# Patient Record
Sex: Female | Born: 1935 | Race: White | Hispanic: No | Marital: Married | Smoking: Former smoker
Health system: Southern US, Community
[De-identification: ages and names within clinical notes are randomized; demographics above are authoritative.]

## PROBLEM LIST (undated history)

## (undated) DIAGNOSIS — G2581 Restless legs syndrome: Secondary | ICD-10-CM

## (undated) DIAGNOSIS — E042 Nontoxic multinodular goiter: Secondary | ICD-10-CM

## (undated) DIAGNOSIS — M810 Age-related osteoporosis without current pathological fracture: Secondary | ICD-10-CM

## (undated) DIAGNOSIS — I2510 Atherosclerotic heart disease of native coronary artery without angina pectoris: Secondary | ICD-10-CM

## (undated) DIAGNOSIS — E88819 Insulin resistance, unspecified: Secondary | ICD-10-CM

## (undated) DIAGNOSIS — E119 Type 2 diabetes mellitus without complications: Secondary | ICD-10-CM

## (undated) DIAGNOSIS — R011 Cardiac murmur, unspecified: Secondary | ICD-10-CM

## (undated) DIAGNOSIS — I10 Essential (primary) hypertension: Secondary | ICD-10-CM

## (undated) DIAGNOSIS — E785 Hyperlipidemia, unspecified: Secondary | ICD-10-CM

## (undated) DIAGNOSIS — I729 Aneurysm of unspecified site: Secondary | ICD-10-CM

## (undated) DIAGNOSIS — F419 Anxiety disorder, unspecified: Secondary | ICD-10-CM

## (undated) DIAGNOSIS — Z9289 Personal history of other medical treatment: Secondary | ICD-10-CM

## (undated) DIAGNOSIS — Z8719 Personal history of other diseases of the digestive system: Secondary | ICD-10-CM

## (undated) DIAGNOSIS — B029 Zoster without complications: Secondary | ICD-10-CM

## (undated) DIAGNOSIS — I209 Angina pectoris, unspecified: Secondary | ICD-10-CM

## (undated) DIAGNOSIS — M1990 Unspecified osteoarthritis, unspecified site: Secondary | ICD-10-CM

## (undated) DIAGNOSIS — G709 Myoneural disorder, unspecified: Secondary | ICD-10-CM

## (undated) DIAGNOSIS — E8881 Metabolic syndrome: Secondary | ICD-10-CM

## (undated) DIAGNOSIS — C3490 Malignant neoplasm of unspecified part of unspecified bronchus or lung: Secondary | ICD-10-CM

## (undated) HISTORY — DX: Atherosclerotic heart disease of native coronary artery without angina pectoris: I25.10

## (undated) HISTORY — DX: Essential (primary) hypertension: I10

## (undated) HISTORY — PX: CARDIAC CATHETERIZATION: SHX172

## (undated) HISTORY — DX: Cardiac murmur, unspecified: R01.1

## (undated) HISTORY — PX: ANGIOPLASTY: SHX39

## (undated) HISTORY — DX: Type 2 diabetes mellitus without complications: E11.9

## (undated) HISTORY — DX: Hyperlipidemia, unspecified: E78.5

## (undated) HISTORY — DX: Restless legs syndrome: G25.81

## (undated) HISTORY — PX: COLONOSCOPY: SHX174

## (undated) HISTORY — DX: Insulin resistance, unspecified: E88.819

## (undated) HISTORY — DX: Aneurysm of unspecified site: I72.9

## (undated) HISTORY — DX: Metabolic syndrome: E88.81

## (undated) HISTORY — PX: APPENDECTOMY: SHX54

## (undated) HISTORY — PX: BREAST SURGERY: SHX581

## (undated) HISTORY — PX: TONSILLECTOMY: SUR1361

## (undated) HISTORY — DX: Personal history of other medical treatment: Z92.89

---

## 1968-07-31 HISTORY — PX: ABDOMINAL HYSTERECTOMY: SHX81

## 1998-05-18 ENCOUNTER — Ambulatory Visit (HOSPITAL_COMMUNITY): Admission: RE | Admit: 1998-05-18 | Discharge: 1998-05-18 | Payer: Self-pay | Admitting: Internal Medicine

## 1999-05-27 ENCOUNTER — Encounter: Payer: Self-pay | Admitting: Internal Medicine

## 1999-05-27 ENCOUNTER — Ambulatory Visit (HOSPITAL_COMMUNITY): Admission: RE | Admit: 1999-05-27 | Discharge: 1999-05-27 | Payer: Self-pay | Admitting: Internal Medicine

## 2000-07-09 ENCOUNTER — Encounter: Payer: Self-pay | Admitting: Internal Medicine

## 2000-07-09 ENCOUNTER — Ambulatory Visit (HOSPITAL_COMMUNITY): Admission: RE | Admit: 2000-07-09 | Discharge: 2000-07-09 | Payer: Self-pay | Admitting: Internal Medicine

## 2001-07-26 ENCOUNTER — Encounter: Payer: Self-pay | Admitting: Internal Medicine

## 2001-07-26 ENCOUNTER — Ambulatory Visit (HOSPITAL_COMMUNITY): Admission: RE | Admit: 2001-07-26 | Discharge: 2001-07-26 | Payer: Self-pay | Admitting: Internal Medicine

## 2002-05-01 ENCOUNTER — Ambulatory Visit (HOSPITAL_COMMUNITY): Admission: RE | Admit: 2002-05-01 | Discharge: 2002-05-01 | Payer: Self-pay | Admitting: Cardiology

## 2002-05-01 ENCOUNTER — Encounter: Payer: Self-pay | Admitting: Cardiology

## 2003-06-04 ENCOUNTER — Ambulatory Visit (HOSPITAL_COMMUNITY): Admission: RE | Admit: 2003-06-04 | Discharge: 2003-06-04 | Payer: Self-pay | Admitting: Internal Medicine

## 2005-10-06 ENCOUNTER — Ambulatory Visit: Payer: Self-pay | Admitting: Gastroenterology

## 2005-10-20 ENCOUNTER — Ambulatory Visit: Payer: Self-pay | Admitting: Gastroenterology

## 2007-08-01 HISTORY — PX: EYE SURGERY: SHX253

## 2009-07-31 HISTORY — PX: REFRACTIVE SURGERY: SHX103

## 2009-10-05 ENCOUNTER — Encounter: Admission: RE | Admit: 2009-10-05 | Discharge: 2009-12-06 | Payer: Self-pay | Admitting: Neurosurgery

## 2009-10-12 ENCOUNTER — Encounter (HOSPITAL_COMMUNITY): Admission: RE | Admit: 2009-10-12 | Discharge: 2009-12-29 | Payer: Self-pay | Admitting: Internal Medicine

## 2010-06-28 ENCOUNTER — Encounter (INDEPENDENT_AMBULATORY_CARE_PROVIDER_SITE_OTHER): Payer: Self-pay | Admitting: *Deleted

## 2010-09-01 NOTE — Letter (Signed)
Summary: New Patient letter  [facility]  [address] [place], [place] [address]   Phone: [phone]  Fax: [phone]       06/28/2010 MRN: [id number]  [name] [address] [place], [place]  [address]  Dear [name],  Welcome to the Gastroenterology Division at [facility].    You are scheduled to see Dr. Arlyce Dice on 07/05/2010 at 10:00AM on the 3rd floor at [facility], [address]  We ask that you try to arrive at our office 15 minutes prior to your appointment time to allow for check-in.  We would like you to complete the enclosed self-administered evaluation form prior to your visit and bring it with you on the day of your appointment.  We will review it with you.  Also, please bring a complete list of all your medications or, if you prefer, bring the medication bottles and we will list them.  Please bring your insurance card so that we may make a copy of it.  If your insurance requires a referral to see a specialist, please bring your referral form from your primary care physician.  Co-payments are due at the time of your visit and may be paid by cash, check or credit card.     Your office visit will consist of a consult with your physician (includes a physical exam), any laboratory testing he/she may order, scheduling of any necessary diagnostic testing (e.g. x-ray, ultrasound, CT-scan), and scheduling of a procedure (e.g. Endoscopy, Colonoscopy) if required.  Please allow enough time on your schedule to allow for any/all of these possibilities.    If you cannot keep your appointment, please call [phone] to cancel or reschedule prior to your appointment date.  This allows [place] the opportunity to schedule an appointment for another patient in need of care.  If you do not cancel or reschedule by 5 p.m. the business day prior to your appointment date, you will be charged a $50.00 late cancellation/no-show fee.    Thank you  for choosing [facility] for your medical needs.  We appreciate the opportunity to care for you.  Please visit [place] at our website  to learn more about our practice.                     Sincerely,                                                             The Gastroenterology Division

## 2010-10-06 ENCOUNTER — Inpatient Hospital Stay (HOSPITAL_COMMUNITY)
Admission: EM | Admit: 2010-10-06 | Discharge: 2010-10-08 | DRG: 287 | Disposition: A | Discharge status: Home / Self Care | Payer: Medicare Other | Attending: Cardiology | Admitting: Cardiology

## 2010-10-06 ENCOUNTER — Emergency Department (HOSPITAL_COMMUNITY): Payer: Medicare Other

## 2010-10-06 ENCOUNTER — Ambulatory Visit
Admission: RE | Admit: 2010-10-06 | Discharge: 2010-10-06 | Disposition: A | Discharge status: Home / Self Care | Payer: Medicare Other | Source: Ambulatory Visit | Attending: Cardiovascular Disease | Admitting: Cardiovascular Disease

## 2010-10-06 ENCOUNTER — Other Ambulatory Visit: Payer: Self-pay | Admitting: Cardiovascular Disease

## 2010-10-06 DIAGNOSIS — Z7902 Long term (current) use of antithrombotics/antiplatelets: Secondary | ICD-10-CM

## 2010-10-06 DIAGNOSIS — I200 Unstable angina: Principal | ICD-10-CM | POA: Diagnosis present

## 2010-10-06 DIAGNOSIS — E785 Hyperlipidemia, unspecified: Secondary | ICD-10-CM | POA: Diagnosis present

## 2010-10-06 DIAGNOSIS — G2581 Restless legs syndrome: Secondary | ICD-10-CM | POA: Diagnosis present

## 2010-10-06 DIAGNOSIS — Z8249 Family history of ischemic heart disease and other diseases of the circulatory system: Secondary | ICD-10-CM

## 2010-10-06 LAB — POCT CARDIAC MARKERS
CKMB, poc: 1.2 ng/mL (ref 1.0–8.0)
CKMB, poc: 1.4 ng/mL (ref 1.0–8.0)
Myoglobin, poc: 64.8 ng/mL (ref 12–200)
Myoglobin, poc: 82.1 ng/mL (ref 12–200)
Troponin i, poc: <0.05 ng/mL (ref 0.00–0.09)
Troponin i, poc: <0.05 ng/mL (ref 0.00–0.09)

## 2010-10-06 LAB — COMPREHENSIVE METABOLIC PANEL
ALT: 31 U/L (ref 0–35)
AST: 25 U/L (ref 0–37)
Albumin: 4.1 g/dL (ref 3.5–5.2)
Alkaline Phosphatase: 77 U/L (ref 39–117)
BUN: 18 mg/dL (ref 6–23)
CO2: 24 mEq/L (ref 19–32)
Calcium: 9.7 mg/dL (ref 8.4–10.5)
Chloride: 106 mEq/L (ref 96–112)
Creatinine, Ser: 0.86 mg/dL (ref 0.4–1.2)
GFR calc Af Amer: >60 mL/min (ref >60)
GFR calc non Af Amer: >60 mL/min (ref >60)
Glucose, Bld: 111 mg/dL — ABNORMAL HIGH (ref 70–99)
Potassium: 3.9 mEq/L (ref 3.5–5.1)
Sodium: 141 mEq/L (ref 135–145)
Total Bilirubin: 0.4 mg/dL (ref 0.3–1.2)
Total Protein: 6.7 g/dL (ref 6.0–8.3)

## 2010-10-06 LAB — CBC
HCT: 39.8 % (ref 36.0–46.0)
Hemoglobin: 13.2 g/dL (ref 12.0–15.0)
MCH: 29.9 pg (ref 26.0–34.0)
MCHC: 33.2 g/dL (ref 30.0–36.0)
MCV: 90 fL (ref 78.0–100.0)
Platelets: 203 10*3/uL (ref 150–400)
RBC: 4.42 MIL/uL (ref 3.87–5.11)
RDW: 13.1 % (ref 11.5–15.5)
WBC: 6.2 10*3/uL (ref 4.0–10.5)

## 2010-10-06 LAB — DIFFERENTIAL
Basophils Absolute: 0 10*3/uL (ref 0.0–0.1)
Basophils Relative: 0 % (ref 0–1)
Eosinophils Absolute: 0.1 10*3/uL (ref 0.0–0.7)
Eosinophils Relative: 1 % (ref 0–5)
Lymphocytes Relative: 39 % (ref 12–46)
Lymphs Abs: 2.4 10*3/uL (ref 0.7–4.0)
Monocytes Absolute: 0.6 10*3/uL (ref 0.1–1.0)
Monocytes Relative: 9 % (ref 3–12)
Neutro Abs: 3.2 10*3/uL (ref 1.7–7.7)
Neutrophils Relative %: 51 % (ref 43–77)

## 2010-10-06 LAB — PROTIME-INR
INR: 0.96 (ref 0.00–1.49)
Prothrombin Time: 13 seconds (ref 11.6–15.2)

## 2010-10-06 LAB — APTT: aPTT: 27 seconds (ref 24–37)

## 2010-10-07 LAB — TSH: TSH: 1.284 u[IU]/mL (ref 0.350–4.500)

## 2010-10-07 LAB — BASIC METABOLIC PANEL
BUN: 15 mg/dL (ref 6–23)
CO2: 24 mEq/L (ref 19–32)
Calcium: 9 mg/dL (ref 8.4–10.5)
Chloride: 108 mEq/L (ref 96–112)
Creatinine, Ser: 0.89 mg/dL (ref 0.4–1.2)
GFR calc Af Amer: >60 mL/min (ref >60)
GFR calc non Af Amer: >60 mL/min (ref >60)
Glucose, Bld: 114 mg/dL — ABNORMAL HIGH (ref 70–99)
Potassium: 3.8 mEq/L (ref 3.5–5.1)
Sodium: 140 mEq/L (ref 135–145)

## 2010-10-07 LAB — LIPID PANEL
Cholesterol: 163 mg/dL (ref 0–200)
Cholesterol: 154 mg/dL (ref 0–200)
HDL: 68 mg/dL (ref >39)
HDL: 63 mg/dL (ref >39)
LDL Cholesterol: 72 mg/dL (ref 0–99)
LDL Cholesterol: 65 mg/dL (ref 0–99)
Total CHOL/HDL Ratio: 2.4 RATIO
Total CHOL/HDL Ratio: 2.4 RATIO
Triglycerides: 115 mg/dL (ref <150)
Triglycerides: 129 mg/dL (ref <150)
VLDL: 23 mg/dL (ref 0–40)
VLDL: 26 mg/dL (ref 0–40)

## 2010-10-07 LAB — MAGNESIUM: Magnesium: 2.3 mg/dL (ref 1.5–2.5)

## 2010-10-07 LAB — CBC
HCT: 39.2 % (ref 36.0–46.0)
Hemoglobin: 13 g/dL (ref 12.0–15.0)
MCH: 29.8 pg (ref 26.0–34.0)
MCHC: 33.2 g/dL (ref 30.0–36.0)
MCV: 89.9 fL (ref 78.0–100.0)
Platelets: 197 10*3/uL (ref 150–400)
RBC: 4.36 MIL/uL (ref 3.87–5.11)
RDW: 13.1 % (ref 11.5–15.5)
WBC: 5.7 10*3/uL (ref 4.0–10.5)

## 2010-10-07 LAB — HEMOGLOBIN A1C
Hgb A1c MFr Bld: 6.7 % — ABNORMAL HIGH (ref <5.7)
Mean Plasma Glucose: 146 mg/dL — ABNORMAL HIGH (ref <117)

## 2010-10-07 LAB — CK TOTAL AND CKMB (NOT AT ARMC)
CK, MB: 3.6 ng/mL (ref 0.3–4.0)
Relative Index: 1.7 (ref 0.0–2.5)
Total CK: 208 U/L — ABNORMAL HIGH (ref 7–177)

## 2010-10-07 LAB — CARDIAC PANEL(CRET KIN+CKTOT+MB+TROPI)
CK, MB: 3.5 ng/mL (ref 0.3–4.0)
CK, MB: 3.5 ng/mL (ref 0.3–4.0)
CK, MB: 3.7 ng/mL (ref 0.3–4.0)
Relative Index: 1.7 (ref 0.0–2.5)
Relative Index: 1.7 (ref 0.0–2.5)
Relative Index: 1.8 (ref 0.0–2.5)
Total CK: 212 U/L — ABNORMAL HIGH (ref 7–177)
Total CK: 203 U/L — ABNORMAL HIGH (ref 7–177)
Total CK: 202 U/L — ABNORMAL HIGH (ref 7–177)
Troponin I: 0.07 ng/mL — ABNORMAL HIGH (ref 0.00–0.06)
Troponin I: 0.09 ng/mL — ABNORMAL HIGH (ref 0.00–0.06)
Troponin I: 0.12 ng/mL — ABNORMAL HIGH (ref 0.00–0.06)

## 2010-10-07 LAB — POCT ACTIVATED CLOTTING TIME: Activated Clotting Time: 476 seconds

## 2010-10-07 LAB — HEPARIN LEVEL (UNFRACTIONATED): Heparin Unfractionated: 0.49 IU/mL (ref 0.30–0.70)

## 2010-10-07 LAB — BRAIN NATRIURETIC PEPTIDE: Pro B Natriuretic peptide (BNP): <30 pg/mL (ref 0.0–100.0)

## 2010-10-07 LAB — TROPONIN I: Troponin I: 0.06 ng/mL (ref 0.00–0.06)

## 2010-10-08 LAB — CBC
HCT: 36.9 % (ref 36.0–46.0)
Hemoglobin: 12.2 g/dL (ref 12.0–15.0)
MCH: 29.9 pg (ref 26.0–34.0)
MCHC: 33.1 g/dL (ref 30.0–36.0)
MCV: 90.4 fL (ref 78.0–100.0)
Platelets: 188 10*3/uL (ref 150–400)
RBC: 4.08 MIL/uL (ref 3.87–5.11)
RDW: 13.4 % (ref 11.5–15.5)
WBC: 6.2 10*3/uL (ref 4.0–10.5)

## 2010-10-08 LAB — BASIC METABOLIC PANEL
BUN: 16 mg/dL (ref 6–23)
CO2: 25 mEq/L (ref 19–32)
Calcium: 9.1 mg/dL (ref 8.4–10.5)
Chloride: 110 mEq/L (ref 96–112)
Creatinine, Ser: 0.99 mg/dL (ref 0.4–1.2)
GFR calc Af Amer: >60 mL/min (ref >60)
GFR calc non Af Amer: 55 mL/min — ABNORMAL LOW (ref >60)
Glucose, Bld: 117 mg/dL — ABNORMAL HIGH (ref 70–99)
Potassium: 4 mEq/L (ref 3.5–5.1)
Sodium: 139 mEq/L (ref 135–145)

## 2010-10-08 LAB — HEPARIN LEVEL (UNFRACTIONATED): Heparin Unfractionated: <0.1 IU/mL — ABNORMAL LOW (ref 0.30–0.70)

## 2010-10-11 NOTE — H&P (Signed)
NAMELILLY, [name] NO.:  [id number]  MEDICAL RECORD NO.:  [id number]           PATIENT TYPE:  I  LOCATION:  6524                         FACILITY:  [facility]  PHYSICIAN:  Ritta Slot, MD     DATE OF BIRTH:  18-Jul-1935  DATE OF ADMISSION:  10/06/2010 DATE OF DISCHARGE:                             HISTORY & PHYSICAL   CHIEF COMPLAINT:  Chest pain.  HISTORY OF PRESENT ILLNESS:  Ms. Kegel is a very pleasant 76 year old white female who presents to [facility] Emergency Department tonight with complaints of chest discomfort.  She was seen last week by Dr. Royann Shivers for exertional angina.  She was given sublingual nitroglycerin and schedule for cardiac catheterization next Wednesday.  Today this evening she was walking and developed substernal chest discomfort with radiation and tightness up into her throat.  She has took one sublingual nitroglycerin without relief and repeated the nitro again with minimal relief.  She also used some powdered aspirin products and that actually help to alleviate her discomfort.  She noted that she was also very hypertensive with systolic blood pressure of 195 while she was experiencing this chest pain.  She complains of associated shortness of breath and mild diaphoresis, no nausea or vomiting, again the pain radiated into her throat, which she describes as a heaviness.  On arrival to the emergency department, her EKG reveals sinus bradycardia with first-degree AV block and initial point-of-care markers were negative.  She reports that at present she is pain free.  PAST MEDICAL HISTORY: 1. Dyslipidemia. 2. Restless legs syndrome. 3. Pleuritis.  FAMILY HISTORY:  Positive for coronary artery disease in her father.  He suffered myocardial infarction at age 20.  SOCIAL HISTORY:  She is married, has one child and two grandchildren. She denies current tobacco use.  She did smoke for 35 years, but quit several years.  Denies any  alcohol or illicit drug use.  ALLERGIES:  None known.  CURRENT MEDICATIONS:  Crestor, nitroglycerin, trazodone, Chantix, and Tylenol.  REVIEW OF SYSTEMS:  As per HPI, otherwise negative.  PHYSICAL EXAMINATION:  VITAL SIGNS:  Blood pressure is 124/50, pulse is 62 and regular, respirations 20, pulse ox is 98% on room air, temperature is 97.7. GENERAL:  This is a very pleasant 76 year old white female in no acute distress. HEENT:  Pupils are equal and reactive to light and accommodation. External movements intact. NECK:  Supple.  No JVD.  No carotid bruits. CARDIOVASCULAR:  Regular rate and rhythm.  No S1-S2 without appreciable murmur, gallop, or rub. LUNGS:  Clear to auscultation bilaterally with normal respiratory effort. ABDOMEN:  Soft, nontender.  No hepatosplenomegaly or masses. EXTREMITIES:  Radial, femoral, and dorsal pedal arteries are present. No lower extremity edema.  No clubbing, cyanosis, or ulcers. NEUROLOGIC:  Oriented to person, place, and time.  Normal mood and affect.  LABORATORY DATA:  EKG reveals sinus bradycardia with first-degree AV block.  White cell count is 6.2, hemoglobin is 13.2, hematocrit 37.8, platelets are 203.  Myoglobin is 64.8, CK-MB 1.2, troponin less than 0.05, PT is 13.0, INR is 0.96, PTT is 27.  Sodium 141, potassium 3.9, chloride 106, carbon dioxide 24,  glucose is 111, BUN is 18, creatinine 0.86.  IMPRESSION: 1. Unstable angina. 2. Dyslipidemia. 3. Elevated blood pressure. 4. Family history of coronary artery disease. 5. History of tobacco abuse. 6. Restless legs syndrome.  PLAN:  We will admit to telemetry and continue rule out for myocardial infarction.  We will continue her home meds as ordered.  We have started her on IV heparin per pharmacy and keep her n.p.o. after midnight in preparation for left heart catheterization and coronary arteriogram to be done in the morning.  The procedure was reviewed, risks and benefits were  covered and the patient and her husband stating understanding.    ______________________________ [employer]a [employer], NP   ______________________________ Ritta Slot, MD    LS/MEDQ  D:  10/07/2010  T:  10/07/2010  Job:  [id number]  cc:   [facility]  Electronically Signed by Charmian Muff NP on 10/09/2010 04:30:57 AM Electronically Signed by Ritta Slot MD on 10/11/2010 01:07:50 PM

## 2010-10-12 NOTE — Procedures (Signed)
[name], [name]         ACCOUNT NO.:  [id number]  MEDICAL RECORD NO.:  [id number]           PATIENT TYPE:  I  LOCATION:  6524                         FACILITY:  [facility]  PHYSICIAN:  Thereasa Solo. Little, M.D. DATE OF BIRTH:  06-24-1935  DATE OF PROCEDURE:  10/07/2010 DATE OF DISCHARGE:                           CARDIAC CATHETERIZATION   INDICATIONS FOR TEST:  This 76 year old female has anginal-type symptoms and was previously scheduled for an outpatient cardiac catheterization next week.  Last night she developed worsening chest pain, presented to the emergency room.  Her EKG is unremarkable.  She had a troponin mildly elevated at 0.6.  She is pain free and brought to the cath lab for diagnostic cath.  PROCEDURE:  After obtaining informed consent, the patient was prepped and draped in the usual sterile fashion exposing the right groin. Following local anesthetic with 1% Xylocaine, the Seldinger technique employed and 5-French introducer sheath was placed in the right femoral artery.  Left and right coronary arteriography, ventriculography, and PCI to the right coronary artery was performed.  COMPLICATIONS:  None.  TOTAL CONTRAST USED:  190 mL.  EQUIPMENT:  5-French Judkins configuration catheters, but it took a JL- 3.5 to cannulate the left main.  RESULTS: 1. Hemodynamic monitoring, central aortic pressure was 159/66.  The     left ventricular pressure was 161/9 with no significant valve     gradient at the time of pullback. 2. Ventriculography.  Ventriculography in the RAO projection using 25     mL of contrast at 12 mL per second was performed after the PCI.     The LV was hyperdynamic with no wall motion abnormalities.  The     ejection fraction was greater than 65% but there was not     obliteration of the LV cavity. 3. Coronary arteriography.     a.     Left main normal.     b.     Circumflex.  The circumflex was a large 3.5 mm vessel.      There was a  proximal 30% area of narrowing.  There were two large      OM vessels that were free of disease. 4. LAD.  The LAD crossed the apex and there was minimal calcification     on fluoroscopy in the proximal portion of the LAD.  The LAD itself     had no obstructive lesions.  The first diagonal which was a smaller     than 2 mm vessel, had a proximal 50-60% area of narrowing. 5. Right coronary artery.  The right coronary artery was a dominant     vessel with a large PDA and posterior lateral vessel both of which     were free of disease.  The mid right coronary artery had an     eccentric 90% area of narrowing that was in the turn, with there     was mild post stenotic dilatation noted and there was     irregularities in the distal right.  Because of the high-grade stenoses in the right coronary artery, arrangements were made for intervention.  The 5-French system was upgraded to a  6-French system.  She was given IV Angiomax and after an ACT of greater than 300.  The intervention was performed.  A 6-French JR-4 guide catheter and a short Luge wire was used.  Predilatation of the lesion was accomplished with a 2.5 x 8 apex balloon, 2 inflations ten x30 and ten x45.  Stenting was accomplished with a 3.0 x 15 integrity stent, nine x30 seconds and eleven x20 seconds.  Postdilatation was accomplished with a 3.25 x 90 Devers sprinter.  Fourteen atmospheres for 40 seconds and fourteen atmospheres for 35 seconds.  The balloon was specifically directed towards the distal portion of the stent where the vessel appeared to be slightly more dilated.  There was an aggressive inflation, however, over the previous eccentric narrowed area.  The 90% area in the mid right coronary preintervention now appeared to be normal.  There was brisk TIMI 3 flow after the procedure in TIMI 2 flow, there was no evidence of dissection or thrombus and no perforation.  The patient was given 600 mg of Plavix, will need to  be on Plavix for minimum of 3 months.          ______________________________ Thereasa Solo. Little, M.D.     ABL/MEDQ  D:  10/07/2010  T:  10/08/2010  Job:  [id number]  cc:   [name]. Eloise Harman, M.D.  Electronically Signed by Julieanne Manson M.D. on 10/12/2010 08:58:28 AM

## 2010-10-12 NOTE — Discharge Summary (Signed)
  [name], [name]         ACCOUNT NO.:  [id number]  MEDICAL RECORD NO.:  [id number]           PATIENT TYPE:  I  LOCATION:  6524                         FACILITY:  [facility]  PHYSICIAN:  Thurmon Fair, MD     DATE OF BIRTH:  [birth year]  DATE OF ADMISSION:  10/06/2010 DATE OF DISCHARGE:  10/08/2010                              DISCHARGE SUMMARY   DISCHARGE DIAGNOSES: 1. Unstable angina, catheterization and subsequent right coronary     artery stenting this admission. 2. Dyslipidemia. 3. Family history of coronary artery disease. 4. History of smoking. 5. History of restless legs syndrome.  HOSPITAL COURSE:  The patient is a 76 year old female presented to [facility] Emergency Room on October 06, 2010, with unstable angina.  She previously seen Dr. Royann Shivers, as an outpatient and was set up for catheterization this week.  Please see admission history and physical for complete details.  She was admitted to telemetry, started on heparin and nitrates.  She was cathed on October 07, 2010, by Dr. Clarene Duke. Catheterization revealed normal left main 30% circumflex, 50-60% diagonal, 90% mid RCA with an EF of 65%.  She underwent PCI and stenting with integrity stents.  Dr. Clarene Duke feels she should be on Plavix for 3 months minimum.  She tolerated the procedure well and we felt she can be discharged on October 08, 2010.  We did cut back on her beta-blocker at discharge as she was bradycardic at times.  LABS AT DISCHARGE:  White count 6.2, hemoglobin 12.2, hematocrit 36.9, and platelets 188.  Sodium 139, potassium 4.0, BUN 16, creatinine 0.9. TSH 1.28.  Cholesterol 163, HDL 68, LDL 72.  CK-MB troponins were negative on admission, post PCI her troponin peaked at 0.12.  Lipid panel reveals a cholesterol 154, HDL 63, LDL 65.  Chest showed borderline cardiomegaly.  EKG shows sinus rhythm without acute changes.  DISPOSITION:  The patient is discharged in stable condition.  We will follow up with Dr.  Royann Shivers.  See med rec for complete discharge medications.     Abelino Derrick, P.A.   ______________________________ Thurmon Fair, MD    LKK/MEDQ  D:  10/08/2010  T:  10/08/2010  Job:  [id number]  Electronically Signed by Corine Shelter P.A. on 10/10/2010 12:24:48 PM Electronically Signed by Thurmon Fair M.D. on 10/12/2010 12:40:54 PM

## 2010-11-29 DIAGNOSIS — Z9289 Personal history of other medical treatment: Secondary | ICD-10-CM

## 2010-11-29 HISTORY — DX: Personal history of other medical treatment: Z92.89

## 2011-02-01 ENCOUNTER — Inpatient Hospital Stay (HOSPITAL_COMMUNITY)
Admission: EM | Admit: 2011-02-01 | Discharge: 2011-02-02 | DRG: 287 | Disposition: A | Discharge status: Home Health | Payer: Medicare Other | Attending: Cardiology | Admitting: Cardiology

## 2011-02-01 ENCOUNTER — Emergency Department (HOSPITAL_COMMUNITY): Payer: Medicare Other

## 2011-02-01 DIAGNOSIS — I2510 Atherosclerotic heart disease of native coronary artery without angina pectoris: Principal | ICD-10-CM | POA: Diagnosis present

## 2011-02-01 DIAGNOSIS — E785 Hyperlipidemia, unspecified: Secondary | ICD-10-CM | POA: Diagnosis present

## 2011-02-01 DIAGNOSIS — Z7902 Long term (current) use of antithrombotics/antiplatelets: Secondary | ICD-10-CM

## 2011-02-01 DIAGNOSIS — Z7982 Long term (current) use of aspirin: Secondary | ICD-10-CM

## 2011-02-01 DIAGNOSIS — G2581 Restless legs syndrome: Secondary | ICD-10-CM | POA: Diagnosis present

## 2011-02-01 DIAGNOSIS — I200 Unstable angina: Secondary | ICD-10-CM | POA: Diagnosis present

## 2011-02-01 DIAGNOSIS — I10 Essential (primary) hypertension: Secondary | ICD-10-CM | POA: Diagnosis present

## 2011-02-01 DIAGNOSIS — Z87891 Personal history of nicotine dependence: Secondary | ICD-10-CM

## 2011-02-01 DIAGNOSIS — Z9861 Coronary angioplasty status: Secondary | ICD-10-CM

## 2011-02-01 LAB — CK TOTAL AND CKMB (NOT AT ARMC)
CK, MB: 4 ng/mL (ref 0.3–4.0)
Relative Index: 1.6 (ref 0.0–2.5)
Total CK: 253 U/L — ABNORMAL HIGH (ref 7–177)

## 2011-02-01 LAB — CBC
HCT: 38.4 % (ref 36.0–46.0)
Hemoglobin: 13.6 g/dL (ref 12.0–15.0)
MCH: 31.3 pg (ref 26.0–34.0)
MCHC: 35.4 g/dL (ref 30.0–36.0)
MCV: 88.3 fL (ref 78.0–100.0)
Platelets: 197 10*3/uL (ref 150–400)
RBC: 4.35 MIL/uL (ref 3.87–5.11)
RDW: 12.7 % (ref 11.5–15.5)
WBC: 5.4 10*3/uL (ref 4.0–10.5)

## 2011-02-01 LAB — COMPREHENSIVE METABOLIC PANEL
ALT: 23 U/L (ref 0–35)
AST: 25 U/L (ref 0–37)
Albumin: 4.2 g/dL (ref 3.5–5.2)
Alkaline Phosphatase: 95 U/L (ref 39–117)
BUN: 18 mg/dL (ref 6–23)
CO2: 25 mEq/L (ref 19–32)
Calcium: 9.9 mg/dL (ref 8.4–10.5)
Chloride: 104 mEq/L (ref 96–112)
Creatinine, Ser: 0.75 mg/dL (ref 0.50–1.10)
GFR calc Af Amer: >60 mL/min (ref >60)
GFR calc non Af Amer: >60 mL/min (ref >60)
Glucose, Bld: 108 mg/dL — ABNORMAL HIGH (ref 70–99)
Potassium: 4.2 mEq/L (ref 3.5–5.1)
Sodium: 140 mEq/L (ref 135–145)
Total Bilirubin: 0.3 mg/dL (ref 0.3–1.2)
Total Protein: 7.2 g/dL (ref 6.0–8.3)

## 2011-02-01 LAB — TROPONIN I: Troponin I: <0.3 ng/mL (ref <0.30)

## 2011-02-01 LAB — DIFFERENTIAL
Basophils Absolute: 0 10*3/uL (ref 0.0–0.1)
Basophils Relative: 1 % (ref 0–1)
Eosinophils Absolute: 0.1 10*3/uL (ref 0.0–0.7)
Eosinophils Relative: 1 % (ref 0–5)
Lymphocytes Relative: 43 % (ref 12–46)
Lymphs Abs: 2.3 10*3/uL (ref 0.7–4.0)
Monocytes Absolute: 0.4 10*3/uL (ref 0.1–1.0)
Monocytes Relative: 8 % (ref 3–12)
Neutro Abs: 2.6 10*3/uL (ref 1.7–7.7)
Neutrophils Relative %: 47 % (ref 43–77)

## 2011-02-02 LAB — LIPID PANEL
Cholesterol: 151 mg/dL (ref 0–200)
HDL: 56 mg/dL (ref >39)
LDL Cholesterol: 65 mg/dL (ref 0–99)
Total CHOL/HDL Ratio: 2.7 RATIO
Triglycerides: 150 mg/dL — ABNORMAL HIGH (ref <150)
VLDL: 30 mg/dL (ref 0–40)

## 2011-02-02 LAB — CBC
HCT: 37.8 % (ref 36.0–46.0)
Hemoglobin: 12.7 g/dL (ref 12.0–15.0)
MCH: 29.5 pg (ref 26.0–34.0)
MCHC: 33.6 g/dL (ref 30.0–36.0)
MCV: 87.7 fL (ref 78.0–100.0)
Platelets: 183 10*3/uL (ref 150–400)
RBC: 4.31 MIL/uL (ref 3.87–5.11)
RDW: 12.7 % (ref 11.5–15.5)
WBC: 5.2 10*3/uL (ref 4.0–10.5)

## 2011-02-02 LAB — CARDIAC PANEL(CRET KIN+CKTOT+MB+TROPI)
CK, MB: 3.9 ng/mL (ref 0.3–4.0)
CK, MB: 3.5 ng/mL (ref 0.3–4.0)
Relative Index: 1.7 (ref 0.0–2.5)
Relative Index: 1.7 (ref 0.0–2.5)
Total CK: 229 U/L — ABNORMAL HIGH (ref 7–177)
Total CK: 206 U/L — ABNORMAL HIGH (ref 7–177)
Troponin I: <0.3 ng/mL (ref <0.30)
Troponin I: <0.3 ng/mL (ref <0.30)

## 2011-02-02 LAB — COMPREHENSIVE METABOLIC PANEL
ALT: 3 U/L (ref 0–35)
AST: 23 U/L (ref 0–37)
Albumin: 3.7 g/dL (ref 3.5–5.2)
Alkaline Phosphatase: 86 U/L (ref 39–117)
BUN: 16 mg/dL (ref 6–23)
CO2: 24 mEq/L (ref 19–32)
Calcium: 9.4 mg/dL (ref 8.4–10.5)
Chloride: 109 mEq/L (ref 96–112)
Creatinine, Ser: 0.7 mg/dL (ref 0.50–1.10)
GFR calc Af Amer: >60 mL/min (ref >60)
GFR calc non Af Amer: >60 mL/min (ref >60)
Glucose, Bld: 102 mg/dL — ABNORMAL HIGH (ref 70–99)
Potassium: 3.8 mEq/L (ref 3.5–5.1)
Sodium: 143 mEq/L (ref 135–145)
Total Bilirubin: 0.3 mg/dL (ref 0.3–1.2)
Total Protein: 6.4 g/dL (ref 6.0–8.3)

## 2011-02-02 LAB — PROTIME-INR
INR: 1.04 (ref 0.00–1.49)
Prothrombin Time: 13.8 seconds (ref 11.6–15.2)

## 2011-02-02 LAB — HEPARIN LEVEL (UNFRACTIONATED): Heparin Unfractionated: 0.33 IU/mL (ref 0.30–0.70)

## 2011-02-02 LAB — POCT ACTIVATED CLOTTING TIME: Activated Clotting Time: 116 seconds

## 2011-02-02 LAB — PRO B NATRIURETIC PEPTIDE: Pro B Natriuretic peptide (BNP): 41.3 pg/mL (ref 0–125)

## 2011-02-02 LAB — MAGNESIUM: Magnesium: 2.2 mg/dL (ref 1.5–2.5)

## 2011-02-02 LAB — HEMOGLOBIN A1C
Hgb A1c MFr Bld: 6.8 % — ABNORMAL HIGH (ref <5.7)
Mean Plasma Glucose: 148 mg/dL — ABNORMAL HIGH (ref <117)

## 2011-02-02 LAB — MRSA PCR SCREENING: MRSA by PCR: NEGATIVE

## 2011-02-02 LAB — TSH: TSH: 1.636 u[IU]/mL (ref 0.350–4.500)

## 2011-02-03 NOTE — Cardiovascular Report (Signed)
NAMEJULEY, [name] NO.:  [id number]  MEDICAL RECORD NO.:  [id number]  LOCATION:  2925                         FACILITY:  [facility]  PHYSICIAN:  Italy Hilty, MD         DATE OF BIRTH:  [birth year]  DATE OF PROCEDURE:  02/02/2011 DATE OF DISCHARGE:  02/02/2011                           CARDIAC CATHETERIZATION   OPERATOR:  Italy Hilty, MD  INDICATION:  Unstable angina.  HISTORY OF PRESENT ILLNESS:  [name] is a 76 year old female with a history of coronary artery disease, status post stent to the right coronary artery in March 2012.  She noted recently over the past few weeks she has been requiring more nitroglycerin for chest pain symptoms and also had episodic spikes in her blood pressure.  Recently, she took nitroglycerin for pain and it was not relieved that occurred at rest and she therefore presented to the emergency department.  Her pain was then fairly promptly resolved by nitroglycerin and heparin.  Cardiac enzymes thus far for 2 sets of been negative.  She is referred for cardiac catheterization.  PROCEDURE:  The patient was brought to the Cardiac Catheterization after procedural and radiation safety time-out.  The patient was given 2 mg of Versed and 50 mcg of fentanyl for moderate sedation.  Attention was turned to the right radial artery which was anesthetized with approximately 1/2 mL of 1% lidocaine with superficial weal.  The artery was then accessed with a straight needle and wire was passed, however, could not be passed further than approximately the elbow.  There was slow flow noted in the artery consistent with arterial spasm.  The patient was well sedated at this time and given this, the decision was made to convert to a femoral approach.  The needle and wire were removed and direct pressure was held until hemostasis was achieved.  The attention then was turned to the right femoral artery which was again anesthetized with  approximately 5 mL of 1% lidocaine.  The right femoral was easily accessed with a straight needle and wire, and a 5-French femoral access catheter was placed.  Subsequently, the patient underwent cardiac catheterization with a 5-French JR-4, JL-4, and pigtail catheters.  A left ventriculogram was performed.  Estimated blood loss was less than 10 mL.  There were no acute complications.  FINDINGS: 1. Left main - no disease. 2. LAD - mild luminal irregularities. 3. LCX - no significant disease. 4. RCA - patent mid RCA stent. 5. LV-gram - EF 55-60%, no wall motion abnormalities, no mitral     regurgitation. 6. LVEDP = 6 mmHg.  IMPRESSION: 1. Patent mid right coronary artery stent. 2. 50-60% ostial diagonal 1 disease, stable, however, this is a small     branch. 3. Ejection fraction 60%, no wall motion abnormalities. 4. Left ventricular end-diastolic pressure = 6 mmHg.  PLAN:  We will plan to pursue medical therapy of coronary artery disease.  This may represent some arterial spasm or chest pain secondary to intermittent hypertension.  She may benefit from a long-acting nitrate.     Italy Hilty, MD     CH/MEDQ  D:  02/02/2011  T:  02/03/2011  Job:  [id number]  cc:  Thereasa Solo. Little, M.D.  Electronically Signed by Kirtland Bouchard. HILTY M.D. on 02/03/2011 08:21:27 AM

## 2011-02-07 NOTE — H&P (Signed)
[name], BAGENT NO.:  [id number]  MEDICAL RECORD NO.:  [id number]  LOCATION:  2925                         FACILITY:  [facility]  PHYSICIAN:  Italy Hilty, MD         DATE OF BIRTH:  March 11, 1935  DATE OF ADMISSION:  02/01/2011 DATE OF DISCHARGE:                             HISTORY & PHYSICAL   PRIMARY CARE PHYSICIAN:  [name]. Eloise Harman, MD  CHIEF COMPLAINT:  Chest pain.  HISTORY OF PRESENT ILLNESS:  [name] is a pleasant 76 year old white female with known coronary atherosclerotic heart disease status post bare-metal stent to the right coronary artery in March 2012.  She also has a history of hypertension and dyslipidemia.  She reports that she noticed some exertional chest discomfort which began about 3 weeks ago.  She has noted marked decline in her activity tolerance.  It seems as if her chest discomfort is occurring with less physical exertion. She has been using nitroglycerin several days a week and at times several times a day according to her husband.  She describes her discomfort as substernal chest pressure and heaviness which radiates into her throat.  Typically, it comes on with walking, however, today she began to experience chest pressure while at rest and had persistent chest discomfort throughout the day.  She did have some relief from the sublingual nitroglycerin; however, her pain never completely went away until she was started on the IV nitroglycerin and she is currently pain- free.  She describes associated shortness of breath, no nausea or vomiting, but has experienced some mild diaphoresis.  Her chest discomfort is very similar to that which she experienced prior to placement of her stent in March.  Her EKG revealed sinus rhythm without acute ischemic changes and her initial set of cardiac enzymes were negative.  Currently, she is pain-free.  PAST MEDICAL HISTORY: 1. Coronary atherosclerotic heart disease.     a.     Status post  cardiac catheterization, October 08, 2010.  There      was 50-60% stenosis in the proximal first diagonal which was a      small vessel.  There was 90% stenosis in the mid right coronary      artery.     b.     Status post PTCA and stent placement of a 3.0 x 15 mm      Integrity stent in the mid RCA. 2. Normal ejection fraction at 65%. 3. Dyslipidemia. 4. Hypertension. 5. Family history of coronary artery disease. 6. Restless legs syndrome. 7. Remote history of tobacco use.  FAMILY HISTORY:  Positive for coronary artery disease in her father.  He suffered myocardial infarction at age 54.  SOCIAL HISTORY:  She is married, has 1 child, 2 grandchildren.  Denies any current tobacco use.  Denies any alcohol or illicit drug use.  ALLERGIES:  None known.  CURRENT MEDICATIONS: 1. Crestor 20 mg daily. 2. Trazodone 50 mg at bedtime. 3. Carbidopa and levodopa 25/100 at 6 p.m. and 10 p.m. 4. Plavix 75 mg daily. 5. Protonix 40 mg daily. 6. Metoprolol 12.5 mg b.i.d. 7. Aspirin 325 daily. 8. Fish oil 1200 mg daily. 9. Flax seed. 10.Vitamin D. 11.CoQ10. 12.Magnesium.  REVIEW  OF SYSTEMS:  As per HPI, otherwise negative.  PHYSICAL EXAMINATION:  VITAL SIGNS:  Blood pressure is 131/49, pulse 56 and regular, respirations 19, pulse ox 98%. GENERAL:  This is a very pleasant 76 year old white female, in no acute distress. HEENT:  Pupils are equal and reactive to light and accommodation. Extraocular movements intact. NECK:  Supple.  No lymphadenopathy or thyromegaly.  No JVD or carotid bruits. CARDIOVASCULAR:  Regular rate and rhythm, S1, S2 without appreciable murmur, gallop, or rub. LUNGS:  Clear to auscultation bilaterally with normal respiratory effort. ABDOMEN:  Soft, nontender without hepatosplenomegaly or masses. EXTREMITIES:  Radial, femoral, dorsal pedal arteries were present.  No lower extremity edema.  No clubbing, cyanosis, or ulcers.  NEUROLOGIC: Oriented to person, place, time.   Normal mood and affect.  Cranial nerves II-XII are grossly intact.  LABORATORY DATA:  EKG reveals normal sinus rhythm without ischemic ST-T wave changes.  CK is 253, MB is 4.0, and troponin is less than 0.30. Sodium is 140, potassium is 4.2, glucose is 108, BUN is 18, creatinine 0.75.  White blood cell count is 5.4, hemoglobin 13.6, hematocrit 38.4,, platelets 197.  Chest x-ray is negative.  IMPRESSION: 1. Unstable angina. 2. Known coronary disease status post percutaneous transluminal     coronary angioplasty and stent in the right coronary artery in     March 2012. 3. Hypertension. 4. Dyslipidemia. 5. Restless legs syndrome. 6. Remote history of tobacco use. 7. Family history of premature coronary artery disease.  PLAN:  We will admit to step-down unit.  Continue IV nitroglycerin and heparin as well as her beta-blocker and aspirin and Plavix.  We will schedule her for cardiac catheterization in the morning to evaluate for obstructive disease.  The procedure was reviewed.  Risks and benefits were covered and the patient and her husband agreed to proceed.    ______________________________ [employer], NP   ______________________________ [place] Hilty, MD    LS/MEDQ  D:  02/02/2011  T:  02/02/2011  Job:  [id number]  cc:   [name]. Eloise Harman, M.D.  Electronically Signed by Charmian Muff NP on 02/06/2011 09:56:40 PM Electronically Signed by Kirtland Bouchard. HILTY M.D. on 02/07/2011 10:21:56 AM

## 2011-02-21 NOTE — Discharge Summary (Signed)
NAMEMATTESON, BLUE NO.:  [id number]  MEDICAL RECORD NO.:  [id number]  LOCATION:  2925                         FACILITY:  [facility]  PHYSICIAN:  Thereasa Solo. Delmi Fulfer, M.D. DATE OF BIRTH:  07/26/1935  DATE OF ADMISSION:  02/01/2011 DATE OF DISCHARGE:  02/02/2011                              DISCHARGE SUMMARY   DISCHARGE DIAGNOSES: 1. Chest pain, negative myocardial infarction.     a.     Stable coronary artery disease by cardiac catheterization      with 50%-60% ostial diagonal 1 disease, patent mid right coronary      artery stent that was previously placed, ejection fraction 60%. 2. Coronary artery disease with previous stent to the right coronary     artery. 3. Dyslipidemia. 4. Hypertension. 5. Family history of coronary artery disease. 6. Restless legs syndrome.  DISCHARGE CONDITION:  Stable and improved.  DISCHARGE MEDICATIONS:  See medication reconciliation sheet.  DISCHARGE INSTRUCTIONS: 1. Increase activity slowly, may shower tomorrow, February 03, 2011. 2. No lifting for 1 week, no driving for 2 days. 3. Low-sodium, heart-healthy diet. 4. Wash cath site with soap and water.  Call if any bleeding,     swelling, or drainage. 5. Follow up with Dr. Clarene Duke in 2 weeks, the office will call with     date and time.  PROCEDURES:  Combined left heart cath by Dr. Italy Hilty on February 02, 2011. Please see dictated report.  HISTORY OF PRESENT ILLNESS AND HOSPITAL COURSE:  A 76 year old white female with known coronary artery disease with bare-metal stent to the RCA in March 2012.  She also has a history of hypertension and dyslipidemia, but came to the emergency room on February 01, 2011, secondary to chest discomfort, exertional which began about 3 weeks prior to admission.  She also has noted marked decline in her activity tolerance and she is having increasing chest discomfort with less physical exertion.  She is using nitroglycerin several times a week and  symptoms several times a day.  It is described as substernal chest pressure and heaviness with radiation into her throat.  Usually, it only occurs with walking, but on day of admission, it occurred at rest and persistent chest discomfort throughout the day.  Some mild relief with the sublingual spray, but the pain never completely went away until she was started on IV nitroglycerin.  She does have associated shortness of breath, but no nausea or vomiting and has had some mild diaphoresis. She feels this is similar to her discomfort she had prior to her placement of the RCA stent in March.  EKG revealed no acute changes and her enzymes were negative.  She was admitted to the step-down unit in CCU for monitoring with IV nitro and IV heparin and continued her aspirin and Plavix.  Dr. Rennis Golden saw her and felt cardiac cath was needed to determine if this is cardiac chest pain.  She underwent cardiac catheterization as described and long-acting nitrate was added under the premise that 50%- 60% ostial diagonal 1 may be causing some discomfort.  It is to be noted, she is on aspirin plus her 325 mg of aspirin a day as well as Plavix.  She is also on  Protonix once a day.  The patient will recover from the cardiac cath and if she ambulates without complications, plans will be to discharge her in the late afternoon, early evening of February 02, 2011.  LABORATORY DATA:  Hemoglobin 12.7, hematocrit 37.8, WBC 5.2, platelets 183.  Protime 13.8, INR of 1.049, sodium 143, potassium 3.8, chloride 109, CO2 of 24, glucose 102, BUN 16, creatinine 0.70.  Total bili 0.3, alkaline phos 86, SGOT 23, SGPT 3, total protein 6.4, albumin 3.7, calcium 9.4, magnesium 2.2.  Cardiac markers; CK 253 and 229, but the MBs were negative at 4.0 and 3.9 and negative troponin I at less than 0.30.  Pro-BNP was 41.3.  Total cholesterol 151, triglycerides 150, HDL 56, LDL 65.  TSH was 1.636 and MRSA screen was negative.  Chest  x-ray on Julty 4, 2012, PA and lateral stable exam.  No active cardiopulmonary process.  EKGs sinus rhythm, sinus brady, rate down to 53, nonspecific T-wave changes.  The patient will follow up as instructed.     Darcella Gasman. [place], N.P.   ______________________________ Thereasa Solo Yvan Dority, M.D.    LRI/MEDQ  D:  02/02/2011  T:  02/03/2011  Job:  [id number]  cc:   [name]. Eloise Harman, M.D.  Electronically Signed by Nada Boozer N.P. on 02/03/2011 04:35:34 PM Electronically Signed by Julieanne Manson M.D. on 02/21/2011 03:39:08 PM

## 2011-07-17 ENCOUNTER — Other Ambulatory Visit (HOSPITAL_COMMUNITY): Payer: Self-pay | Admitting: Internal Medicine

## 2011-07-21 ENCOUNTER — Ambulatory Visit (HOSPITAL_COMMUNITY)
Admission: RE | Admit: 2011-07-21 | Discharge: 2011-07-21 | Disposition: A | Discharge status: Home / Self Care | Payer: Medicare Other | Source: Ambulatory Visit | Attending: Internal Medicine | Admitting: Internal Medicine

## 2011-07-21 DIAGNOSIS — K228 Other specified diseases of esophagus: Secondary | ICD-10-CM | POA: Insufficient documentation

## 2011-07-21 DIAGNOSIS — R1310 Dysphagia, unspecified: Secondary | ICD-10-CM | POA: Insufficient documentation

## 2011-07-21 DIAGNOSIS — K2289 Other specified disease of esophagus: Secondary | ICD-10-CM | POA: Insufficient documentation

## 2011-10-10 ENCOUNTER — Ambulatory Visit
Admission: RE | Admit: 2011-10-10 | Discharge: 2011-10-10 | Disposition: A | Discharge status: Home / Self Care | Payer: Medicare Other | Source: Ambulatory Visit | Attending: Neurosurgery | Admitting: Neurosurgery

## 2011-10-10 ENCOUNTER — Other Ambulatory Visit: Payer: Self-pay | Admitting: Neurosurgery

## 2011-10-10 DIAGNOSIS — M5412 Radiculopathy, cervical region: Secondary | ICD-10-CM

## 2011-10-10 DIAGNOSIS — M542 Cervicalgia: Secondary | ICD-10-CM

## 2011-10-10 DIAGNOSIS — M546 Pain in thoracic spine: Secondary | ICD-10-CM

## 2012-01-29 ENCOUNTER — Ambulatory Visit: Payer: Medicare Other | Attending: Orthopedic Surgery | Admitting: Physical Therapy

## 2012-01-29 DIAGNOSIS — M2569 Stiffness of other specified joint, not elsewhere classified: Secondary | ICD-10-CM | POA: Insufficient documentation

## 2012-01-29 DIAGNOSIS — M25559 Pain in unspecified hip: Secondary | ICD-10-CM | POA: Insufficient documentation

## 2012-01-29 DIAGNOSIS — IMO0001 Reserved for inherently not codable concepts without codable children: Secondary | ICD-10-CM | POA: Insufficient documentation

## 2012-02-06 ENCOUNTER — Ambulatory Visit: Payer: Medicare Other | Admitting: Physical Therapy

## 2012-02-08 ENCOUNTER — Ambulatory Visit: Payer: Medicare Other | Admitting: Physical Therapy

## 2012-02-13 ENCOUNTER — Ambulatory Visit: Payer: Medicare Other | Admitting: Physical Therapy

## 2012-02-15 ENCOUNTER — Ambulatory Visit: Payer: Medicare Other | Admitting: Physical Therapy

## 2012-02-19 ENCOUNTER — Ambulatory Visit: Payer: Medicare Other | Admitting: Physical Therapy

## 2012-02-22 ENCOUNTER — Ambulatory Visit: Payer: Medicare Other | Admitting: Physical Therapy

## 2012-02-23 ENCOUNTER — Ambulatory Visit: Payer: Medicare Other | Admitting: Physical Therapy

## 2012-03-26 ENCOUNTER — Ambulatory Visit: Payer: Medicare Other | Attending: Orthopedic Surgery | Admitting: Physical Therapy

## 2012-03-26 DIAGNOSIS — M2569 Stiffness of other specified joint, not elsewhere classified: Secondary | ICD-10-CM | POA: Insufficient documentation

## 2012-03-26 DIAGNOSIS — M25559 Pain in unspecified hip: Secondary | ICD-10-CM | POA: Insufficient documentation

## 2012-03-26 DIAGNOSIS — IMO0001 Reserved for inherently not codable concepts without codable children: Secondary | ICD-10-CM | POA: Insufficient documentation

## 2012-03-28 ENCOUNTER — Ambulatory Visit: Payer: Medicare Other | Admitting: Physical Therapy

## 2012-03-31 DIAGNOSIS — Z9289 Personal history of other medical treatment: Secondary | ICD-10-CM

## 2012-03-31 HISTORY — DX: Personal history of other medical treatment: Z92.89

## 2012-04-02 ENCOUNTER — Ambulatory Visit: Payer: Medicare Other | Admitting: Physical Therapy

## 2012-04-04 ENCOUNTER — Ambulatory Visit: Payer: Medicare Other | Attending: Orthopedic Surgery | Admitting: Physical Therapy

## 2012-04-04 DIAGNOSIS — M25559 Pain in unspecified hip: Secondary | ICD-10-CM | POA: Insufficient documentation

## 2012-04-04 DIAGNOSIS — M2569 Stiffness of other specified joint, not elsewhere classified: Secondary | ICD-10-CM | POA: Insufficient documentation

## 2012-04-04 DIAGNOSIS — IMO0001 Reserved for inherently not codable concepts without codable children: Secondary | ICD-10-CM | POA: Insufficient documentation

## 2013-04-29 ENCOUNTER — Other Ambulatory Visit: Payer: Self-pay | Admitting: Gastroenterology

## 2013-05-20 ENCOUNTER — Ambulatory Visit: Payer: Medicare Other | Admitting: Cardiovascular Disease

## 2013-05-31 ENCOUNTER — Encounter: Payer: Self-pay | Admitting: *Deleted

## 2013-06-03 ENCOUNTER — Encounter: Payer: Self-pay | Admitting: Cardiovascular Disease

## 2013-06-03 ENCOUNTER — Ambulatory Visit (INDEPENDENT_AMBULATORY_CARE_PROVIDER_SITE_OTHER): Payer: Medicare Other | Admitting: Cardiovascular Disease

## 2013-06-03 VITALS — BP 122/68 | HR 62 | Ht 65.0 in | Wt 187.5 lb

## 2013-06-03 DIAGNOSIS — I10 Essential (primary) hypertension: Secondary | ICD-10-CM | POA: Insufficient documentation

## 2013-06-03 DIAGNOSIS — R011 Cardiac murmur, unspecified: Secondary | ICD-10-CM

## 2013-06-03 DIAGNOSIS — I2510 Atherosclerotic heart disease of native coronary artery without angina pectoris: Secondary | ICD-10-CM | POA: Insufficient documentation

## 2013-06-03 DIAGNOSIS — I25118 Atherosclerotic heart disease of native coronary artery with other forms of angina pectoris: Secondary | ICD-10-CM | POA: Insufficient documentation

## 2013-06-03 DIAGNOSIS — G2581 Restless legs syndrome: Secondary | ICD-10-CM | POA: Insufficient documentation

## 2013-06-03 DIAGNOSIS — E785 Hyperlipidemia, unspecified: Secondary | ICD-10-CM | POA: Insufficient documentation

## 2013-06-03 DIAGNOSIS — R5381 Other malaise: Secondary | ICD-10-CM

## 2013-06-03 DIAGNOSIS — Z79899 Other long term (current) drug therapy: Secondary | ICD-10-CM

## 2013-06-03 NOTE — Patient Instructions (Signed)
Your physician recommends that you schedule a follow-up appointment in: 3 months   Your physician recommends that you return for lab work in: 2 weeks you need to be fasting.Idamae Lusher LIPIDS,TSH  Your physician has requested that you have an echocardiogram. Echocardiography is a painless test that uses sound waves to create images of your heart. It provides your doctor with information about the size and shape of your heart and how well your heart's chambers and valves are working. This procedure takes approximately one hour. There are no restrictions for this procedure.

## 2013-06-03 NOTE — Progress Notes (Signed)
Patient ID: [name], female   DOB: 01-28-1935, 78 y.o.   MRN: [id number]     HPI: [name] is a 78 y.o. female who presents to the office for six-month cardiology evaluation. Ms. Schlauch has known coronary artery disease in March 2012 underwent stenting of a 90% eccentric RCA stenosis with a 3.0x15 mm integrity bare-metal stent. She developed recurrent chest pain in July 2012 and repeat catheterization revealed widely patent stent. She did have 60% postural diagonal 1 stenosis and medical therapy was recommended. She had mild luminal irregularities of the LAD.  In 2012 an echo Doppler study did show normal systolic function with mild aortic sclerosis mild MR and mild TR. She has a history of hyperlipidemia, restless legs, as well as documented insulin resistance.  Presently, she has done fairly well. However, she does note some mild shortness of breath particularly with activity. She denies any recent significant chest pain symptoms but at times has noticed rare tightness. She is taking a half a pill of a low dose lisinopril 2.5 mg which apparently was left on her formulary below. She denies palpitations. She denies presyncope or syncope.  Past Medical History  Diagnosis Date  . CAD (coronary artery disease)   . Hyperlipemia   . Restless legs   . Insulin resistance   . Hypertension   . Aneurysm     Right eye a non DES stent was placed so that she would not required long term dual antiplatelet therapy.  Marland Kitchen Hx of echocardiogram 11/2010    Ef 67% Normal size chambes, Aortic valve sclerosis without stenosis, No other significant valvular abnormalities, No percardial effusion.  Marland Kitchen History of stress test 03/2012    The post stress myocardial perfusion images show a normal pattern of perfusion in all region. The post left ventricles is normal in size. There is no scintigraphic of inductible myocardial ischemia. The post EF is 73    Past Surgical History  Procedure  Laterality Date  . Angioplasty      Stenting of a 90% eccentric right coronary artery stenosis and had a 3.0x15 mm Integrity bare-metal stent inserted  . Cardiac catheterization      Showed a widely patent stent, she did have 60% ostial diagonal-1 stenosis, She also had mild luminal irregularities of her LAD.  Marland Kitchen Abdominal hysterectomy  1970  . Eye surgery  2009    cateract surgery  . Refractive surgery  2011    No Known Allergies  Current Outpatient Prescriptions  Medication Sig Dispense Refill  . aspirin 81 MG tablet Take 81 mg by mouth daily.      . carbidopa-levodopa (SINEMET IR) 25-100 MG per tablet Take one half tablet daily..      . Coenzyme Q10 (CO Q 10) 100 MG CAPS Take 1 capsule by mouth daily.      . Cyanocobalamin (VITAMIN B 12 PO) Take 1 tablet by mouth daily.      . famotidine (PEPCID) 10 MG tablet Take 10 mg by mouth as needed for heartburn.      . fish oil-omega-3 fatty acids 1000 MG capsule Take 1,200 g by mouth daily.       . Flaxseed, Linseed, (FLAX SEED OIL) 1000 MG CAPS Take 1 capsule by mouth daily.      Marland Kitchen gabapentin (NEURONTIN) 300 MG capsule Take 300 mg by mouth daily.       . metoprolol succinate (TOPROL-XL) 25 MG 24 hr tablet Take 25 mg by mouth daily.      Marland Kitchen  nitroGLYCERIN (NITROSTAT) 0.4 MG SL tablet Place 0.4 mg under the tongue every 5 (five) minutes as needed for chest pain.      . rosuvastatin (CRESTOR) 40 MG tablet Take 40 mg by mouth daily.      . traZODone (DESYREL) 50 MG tablet Take 50 mg by mouth at bedtime.       No current facility-administered medications for this visit.    History   Social History  . Marital Status: Married    Spouse Name: N/A    Number of Children: N/A  . Years of Education: N/A   Occupational History  . Not on file.   Social History Main Topics  . Smoking status: Former Smoker -- 2.00 packs/day for 33 years    Types: Cigarettes  . Smokeless tobacco: Never Used     Comment: quit in 1995.  Marland Kitchen Alcohol Use: Yes      Comment: occasional  . Drug Use: Not on file  . Sexual Activity: Not on file   Other Topics Concern  . Not on file   Social History Narrative  . No narrative on file    Family History  Problem Relation Age of Onset  . Alzheimer's disease Mother 90  . Heart attack Father 29  . Cancer Maternal Grandmother 73  . Diabetes Sister    Socially she is married has one child and 2 grandchildren. There is no tobacco or alcohol use.  ROS is negative for fevers, chills or night sweats.  She denies visual changes. She denies cough or sputum production. She denies wheezing. She denies PND or orthopnea. She does note some mild shortness of breath with activity. She denies any severe episode of chest pain but has noticed rare vague chest tightness. She denies abdominal pain. She denies nausea or vomiting. She denies blood in her stool or urine. She denies claudication. She does have history of hyperlipidemia. She does have restless legs per does have GERD but this has been controlled. She denies edema Other comprehensive 12 point system review is negative.  PE BP 122/68  Pulse 62  Ht 5\' 5"  (1.651 m)  Wt 187 lb 8 oz (85.049 kg)  BMI 31.20 kg/m2  General: Alert, oriented, no distress.  Skin: normal turgor, no rashes HEENT: Normocephalic, atraumatic. Pupils round and reactive; sclera anicteric;no lid lag.  Nose without nasal septal hypertrophy Mouth/Parynx benign; Mallinpatti scale 2 Neck: No JVD, no carotid briuts Lungs: clear to ausculatation and percussion; no wheezing or rales Heart: RRR, s1 s2 normal 2/6 systolic murmur in aortic region Abdomen: soft, nontender; no hepatosplenomehaly, BS+; abdominal aorta nontender and not dilated by palpation. Pulses 2+ Extremities: no clubbing cyanosis or edema, Homan's sign negative  Neurologic: grossly nonfocal Psychologic: normal affect and mood.  ECG: Sinus rhythm with first degree AV block. QRS complex V1 V2. Nonspecific ST changes which are present  previously. Borderline LVH by voltage in aVL  LABS:  BMET    Component Value Date/Time   NA 143 02/02/2011 0545   K 3.8 02/02/2011 0545   CL 109 02/02/2011 0545   CO2 24 02/02/2011 0545   GLUCOSE 102* 02/02/2011 0545   BUN 16 02/02/2011 0545   CREATININE 0.70 02/02/2011 0545   CALCIUM 9.4 02/02/2011 0545   GFRNONAA >60 02/02/2011 0545   GFRAA >60 02/02/2011 0545     Hepatic Function Panel     Component Value Date/Time   PROT 6.4 02/02/2011 0545   ALBUMIN 3.7 02/02/2011 0545   AST 23 02/02/2011  0545   ALT 3 02/02/2011 0545   ALKPHOS 86 02/02/2011 0545   BILITOT 0.3 02/02/2011 0545     CBC    Component Value Date/Time   WBC 5.2 02/02/2011 0545   RBC 4.31 02/02/2011 0545   HGB 12.7 02/02/2011 0545   HCT 37.8 02/02/2011 0545   PLT 183 02/02/2011 0545   MCV 87.7 02/02/2011 0545   MCH 29.5 02/02/2011 0545   MCHC 33.6 02/02/2011 0545   RDW 12.7 02/02/2011 0545   LYMPHSABS 2.3 02/01/2011 1845   MONOABS 0.4 02/01/2011 1845   EOSABS 0.1 02/01/2011 1845   BASOSABS 0.0 02/01/2011 1845     BNP    Component Value Date/Time   PROBNP 41.3 02/02/2011 0545    Lipid Panel     Component Value Date/Time   CHOL 151 02/02/2011 0545   TRIG 150* 02/02/2011 0545   HDL 56 02/02/2011 0545   CHOLHDL 2.7 02/02/2011 0545   VLDL 30 02/02/2011 0545   LDLCALC 65 02/02/2011 0545     RADIOLOGY: No results found.    ASSESSMENT AND PLAN:  Ms. Posas is now 78 years old. She is over 3-1/2 years status post stenting of her right coronary artery with a bare metal stent. She does have 60% ostial diagonal stenosis. She has a history of hyperlipidemia. Physical exam does suggest possible aggression of her aortic sclerosis and possible mild stenosis. She has noticed episodes of some shortness of breath particularly with activity. I'm recommending complete set of laboratory to recheck in the fasting state. I will check an NMR lipoprofile on her current medical regimen.  I will see her back in the office in 3 months for followup evaluation.    Lennette Bihari, MD, [facility]  06/03/2013 4:06 PM

## 2013-06-13 LAB — COMPREHENSIVE METABOLIC PANEL
ALT: 24 U/L (ref 0–35)
AST: 23 U/L (ref 0–37)
Albumin: 4.5 g/dL (ref 3.5–5.2)
Alkaline Phosphatase: 57 U/L (ref 39–117)
BUN: 21 mg/dL (ref 6–23)
CO2: 26 mEq/L (ref 19–32)
Calcium: 9.9 mg/dL (ref 8.4–10.5)
Chloride: 102 mEq/L (ref 96–112)
Creat: 0.9 mg/dL (ref 0.50–1.10)
Glucose, Bld: 118 mg/dL — ABNORMAL HIGH (ref 70–99)
Potassium: 4.7 mEq/L (ref 3.5–5.3)
Sodium: 136 mEq/L (ref 135–145)
Total Bilirubin: 0.5 mg/dL (ref 0.3–1.2)
Total Protein: 6.9 g/dL (ref 6.0–8.3)

## 2013-06-13 LAB — NMR LIPOPROFILE WITH LIPIDS
Cholesterol, Total: 141 mg/dL (ref <200)
HDL Particle Number: 39.7 umol/L (ref >=30.5)
HDL Size: 9.2 nm (ref >=9.2)
HDL-C: 53 mg/dL (ref >=40)
LDL (calc): 62 mg/dL (ref <100)
LDL Particle Number: 1031 nmol/L — ABNORMAL HIGH (ref <1000)
LDL Size: 20.2 nm — ABNORMAL LOW (ref >20.5)
LP-IR Score: 57 — ABNORMAL HIGH (ref <=45)
Large HDL-P: 6.1 umol/L (ref >=4.8)
Large VLDL-P: 4.2 nmol/L — ABNORMAL HIGH (ref <=2.7)
Small LDL Particle Number: 629 nmol/L — ABNORMAL HIGH (ref <=527)
Triglycerides: 129 mg/dL (ref <150)
VLDL Size: 51 nm — ABNORMAL HIGH (ref <=46.6)

## 2013-06-13 LAB — CBC
HCT: 38.5 % (ref 36.0–46.0)
Hemoglobin: 13.2 g/dL (ref 12.0–15.0)
MCH: 30 pg (ref 26.0–34.0)
MCHC: 34.3 g/dL (ref 30.0–36.0)
MCV: 87.5 fL (ref 78.0–100.0)
Platelets: 190 10*3/uL (ref 150–400)
RBC: 4.4 MIL/uL (ref 3.87–5.11)
RDW: 13.3 % (ref 11.5–15.5)
WBC: 3.9 10*3/uL — ABNORMAL LOW (ref 4.0–10.5)

## 2013-06-13 LAB — TSH: TSH: 0.899 u[IU]/mL (ref 0.350–4.500)

## 2013-06-30 ENCOUNTER — Ambulatory Visit (HOSPITAL_COMMUNITY)
Admission: RE | Admit: 2013-06-30 | Discharge: 2013-06-30 | Disposition: A | Discharge status: Home / Self Care | Payer: Medicare Other | Source: Ambulatory Visit | Attending: Cardiovascular Disease | Admitting: Cardiovascular Disease

## 2013-06-30 DIAGNOSIS — R0609 Other forms of dyspnea: Secondary | ICD-10-CM | POA: Insufficient documentation

## 2013-06-30 DIAGNOSIS — E785 Hyperlipidemia, unspecified: Secondary | ICD-10-CM | POA: Insufficient documentation

## 2013-06-30 DIAGNOSIS — R011 Cardiac murmur, unspecified: Secondary | ICD-10-CM | POA: Insufficient documentation

## 2013-06-30 DIAGNOSIS — R0989 Other specified symptoms and signs involving the circulatory and respiratory systems: Secondary | ICD-10-CM | POA: Insufficient documentation

## 2013-06-30 DIAGNOSIS — I10 Essential (primary) hypertension: Secondary | ICD-10-CM | POA: Insufficient documentation

## 2013-06-30 DIAGNOSIS — I2510 Atherosclerotic heart disease of native coronary artery without angina pectoris: Secondary | ICD-10-CM

## 2013-06-30 NOTE — Progress Notes (Signed)
2D Echo Performed 06/30/2013    [name], RCS.  

## 2013-07-20 ENCOUNTER — Encounter: Payer: Self-pay | Admitting: *Deleted

## 2013-07-22 ENCOUNTER — Other Ambulatory Visit: Payer: Self-pay | Admitting: Neurological Surgery

## 2013-07-28 ENCOUNTER — Encounter (HOSPITAL_COMMUNITY): Payer: Self-pay | Admitting: *Deleted

## 2013-07-28 ENCOUNTER — Other Ambulatory Visit (HOSPITAL_COMMUNITY): Payer: Self-pay | Admitting: *Deleted

## 2013-07-28 ENCOUNTER — Encounter: Payer: Self-pay | Admitting: Cardiovascular Disease

## 2013-07-28 NOTE — Progress Notes (Signed)
Pt denies any recent chest pain ?

## 2013-07-29 ENCOUNTER — Telehealth: Payer: Self-pay | Admitting: *Deleted

## 2013-07-29 MED ORDER — CEFAZOLIN SODIUM-DEXTROSE 2-3 GM-% IV SOLR
2.0000 g | INTRAVENOUS | Status: AC
  Administered 2013-07-30: 2 g via INTRAVENOUS
  Filled 2013-07-29: qty 50

## 2013-07-29 NOTE — Telephone Encounter (Signed)
Signed surgical clearance faxed - OK to go ahead w/anterior cervical fusion.

## 2013-07-29 NOTE — Progress Notes (Signed)
Anesthesia chart review:  Patient is a 78 year old female scheduled for C5-6 ACDF on 07/30/13 by Dr. Yetta Barre.  She is a same day work-up.  History includes CAD s/p BMS RCA 09/2010 with residual 60% D1 stenosis being treated medically, HLD, RLS, insulin resistance, HTN, right eye "aneurysm", obesity, anxiety, hiatal hernia, arthritis, multiple thyroid nodules, hysterectomy, cataract extractions, appendectomy, tonsillectomy. PCP is listed as Dr. Jarome Matin. Cardiologist is Dr. Nicki Guadalajara with last visit on 06/03/13 and ordered an echo which was unremarkable.  Patient denied any recent chest pain during her PAT phone interview.  Echo on 06/30/13 showed: - Left ventricle: The cavity size was normal. Systolic function was normal. The estimated ejection fraction was in the range of 55% to 60%. Wall motion was normal; there were no regional wall motion abnormalities. - Mitral valve: Valve area by pressure half-time: 1.68cm^2. - Atrial septum: No defect or patent foramen ovale was identified. - Pulmonic valve: Trivial regurgitation. - Tricuspid valve: Trivial regurgitation.  Nuclear stress test on 03/12/12 showed normal myocardial perfusion, no scintigraphic evidence of inducible myocardial ischemia, post-stress EF 73%, no significant wall motion abnormalities, 6 METS, EKG negative for ischemia, exaggerated BP response to exercise, occasional PVCs with couplet and 4 beat NSVT. Low risk scan. EKG on 06/03/13 showed SR with first degree AVB, septal infarct (age undetermined).  She is scheduled for CXR and labs on arrival.  She has had a stress test within the past two years and cardiology follow-up with echo within the past two months.  Further evaluation by her assigned anesthesiologist on the day of surgery, but if no same day study results are acceptable and no new CV symptoms then I would anticipate that she could proceed as planned.  Velna Ochs [facility] Short Stay Center/Anesthesiology Phone  [phone] 07/29/2013 9:28 AM

## 2013-07-30 ENCOUNTER — Inpatient Hospital Stay (HOSPITAL_COMMUNITY): Payer: Medicare Other

## 2013-07-30 ENCOUNTER — Encounter (HOSPITAL_COMMUNITY): Payer: Self-pay | Admitting: *Deleted

## 2013-07-30 ENCOUNTER — Encounter (HOSPITAL_COMMUNITY): Payer: Medicare Other | Admitting: Vascular Surgery

## 2013-07-30 ENCOUNTER — Encounter (HOSPITAL_COMMUNITY)
Admission: RE | Disposition: A | Discharge status: Home / Self Care | Payer: Self-pay | Source: Ambulatory Visit | Attending: Neurological Surgery

## 2013-07-30 ENCOUNTER — Inpatient Hospital Stay (HOSPITAL_COMMUNITY): Payer: Medicare Other | Admitting: Vascular Surgery

## 2013-07-30 ENCOUNTER — Inpatient Hospital Stay (HOSPITAL_COMMUNITY)
Admission: RE | Admit: 2013-07-30 | Discharge: 2013-07-31 | DRG: 473 | Disposition: A | Discharge status: Home / Self Care | Payer: Medicare Other | Source: Ambulatory Visit | Attending: Neurological Surgery | Admitting: Neurological Surgery

## 2013-07-30 DIAGNOSIS — F411 Generalized anxiety disorder: Secondary | ICD-10-CM | POA: Diagnosis present

## 2013-07-30 DIAGNOSIS — I10 Essential (primary) hypertension: Secondary | ICD-10-CM | POA: Diagnosis present

## 2013-07-30 DIAGNOSIS — E785 Hyperlipidemia, unspecified: Secondary | ICD-10-CM | POA: Diagnosis present

## 2013-07-30 DIAGNOSIS — E669 Obesity, unspecified: Secondary | ICD-10-CM | POA: Diagnosis present

## 2013-07-30 DIAGNOSIS — M47812 Spondylosis without myelopathy or radiculopathy, cervical region: Secondary | ICD-10-CM | POA: Diagnosis present

## 2013-07-30 DIAGNOSIS — I2510 Atherosclerotic heart disease of native coronary artery without angina pectoris: Secondary | ICD-10-CM | POA: Diagnosis present

## 2013-07-30 DIAGNOSIS — Z7982 Long term (current) use of aspirin: Secondary | ICD-10-CM

## 2013-07-30 DIAGNOSIS — Z9861 Coronary angioplasty status: Secondary | ICD-10-CM

## 2013-07-30 DIAGNOSIS — M5020 Other cervical disc displacement, unspecified cervical region: Principal | ICD-10-CM | POA: Diagnosis present

## 2013-07-30 DIAGNOSIS — Z87891 Personal history of nicotine dependence: Secondary | ICD-10-CM

## 2013-07-30 DIAGNOSIS — IMO0002 Reserved for concepts with insufficient information to code with codable children: Secondary | ICD-10-CM | POA: Diagnosis present

## 2013-07-30 DIAGNOSIS — Z79899 Other long term (current) drug therapy: Secondary | ICD-10-CM

## 2013-07-30 DIAGNOSIS — Z981 Arthrodesis status: Secondary | ICD-10-CM

## 2013-07-30 HISTORY — DX: Unspecified osteoarthritis, unspecified site: M19.90

## 2013-07-30 HISTORY — PX: ANTERIOR CERVICAL DECOMP/DISCECTOMY FUSION: SHX1161

## 2013-07-30 HISTORY — DX: Myoneural disorder, unspecified: G70.9

## 2013-07-30 HISTORY — DX: Zoster without complications: B02.9

## 2013-07-30 HISTORY — DX: Nontoxic multinodular goiter: E04.2

## 2013-07-30 HISTORY — DX: Personal history of other diseases of the digestive system: Z87.19

## 2013-07-30 HISTORY — DX: Anxiety disorder, unspecified: F41.9

## 2013-07-30 LAB — CBC
HCT: 40.7 % (ref 36.0–46.0)
Hemoglobin: 13.6 g/dL (ref 12.0–15.0)
MCH: 30.8 pg (ref 26.0–34.0)
MCHC: 33.4 g/dL (ref 30.0–36.0)
MCV: 92.3 fL (ref 78.0–100.0)
Platelets: 194 10*3/uL (ref 150–400)
RBC: 4.41 MIL/uL (ref 3.87–5.11)
RDW: 12.8 % (ref 11.5–15.5)
WBC: 5 10*3/uL (ref 4.0–10.5)

## 2013-07-30 LAB — BASIC METABOLIC PANEL
BUN: 26 mg/dL — ABNORMAL HIGH (ref 6–23)
CO2: 23 mEq/L (ref 19–32)
Calcium: 9.1 mg/dL (ref 8.4–10.5)
Chloride: 104 mEq/L (ref 96–112)
Creatinine, Ser: 0.77 mg/dL (ref 0.50–1.10)
GFR calc Af Amer: >90 mL/min (ref >90)
GFR calc non Af Amer: 80 mL/min — ABNORMAL LOW (ref >90)
Glucose, Bld: 117 mg/dL — ABNORMAL HIGH (ref 70–99)
Potassium: 4.5 mEq/L (ref 3.7–5.3)
Sodium: 140 mEq/L (ref 137–147)

## 2013-07-30 LAB — SURGICAL PCR SCREEN
MRSA, PCR: NEGATIVE
Staphylococcus aureus: NEGATIVE

## 2013-07-30 SURGERY — ANTERIOR CERVICAL DECOMPRESSION/DISCECTOMY FUSION 1 LEVEL
Anesthesia: General | Site: Neck

## 2013-07-30 MED ORDER — PROPOFOL 10 MG/ML IV BOLUS
INTRAVENOUS | Status: DC | PRN
  Administered 2013-07-30: 170 mg via INTRAVENOUS

## 2013-07-30 MED ORDER — LACTATED RINGERS IV SOLN
INTRAVENOUS | Status: DC
  Administered 2013-07-30 (×2): via INTRAVENOUS

## 2013-07-30 MED ORDER — OXYCODONE HCL 5 MG PO TABS
5.0000 mg | ORAL_TABLET | Freq: Once | ORAL | Status: AC | PRN
Start: 2013-07-30 — End: 2013-07-30
  Administered 2013-07-30: 5 mg via ORAL

## 2013-07-30 MED ORDER — TRAZODONE HCL 100 MG PO TABS
100.0000 mg | ORAL_TABLET | Freq: Every day | ORAL | Status: DC
  Administered 2013-07-30: 100 mg via ORAL
  Filled 2013-07-30 (×2): qty 1

## 2013-07-30 MED ORDER — MENTHOL 3 MG MT LOZG: 1.0000 | LOZENGE | OROMUCOSAL | Status: DC | PRN

## 2013-07-30 MED ORDER — PHENOL 1.4 % MT LIQD
1.0000 | OROMUCOSAL | Status: DC | PRN
  Administered 2013-07-30: 1 via OROMUCOSAL
  Filled 2013-07-30: qty 177

## 2013-07-30 MED ORDER — EPHEDRINE SULFATE 50 MG/ML IJ SOLN
INTRAMUSCULAR | Status: DC | PRN
  Administered 2013-07-30 (×2): 10 mg via INTRAVENOUS

## 2013-07-30 MED ORDER — ONDANSETRON HCL 4 MG/2ML IJ SOLN: 4.0000 mg | Freq: Four times a day (QID) | INTRAMUSCULAR | Status: DC | PRN

## 2013-07-30 MED ORDER — SENNA 8.6 MG PO TABS
1.0000 | ORAL_TABLET | Freq: Two times a day (BID) | ORAL | Status: DC
  Administered 2013-07-31: 8.6 mg via ORAL
  Filled 2013-07-30 (×2): qty 1

## 2013-07-30 MED ORDER — MUPIROCIN 2 % EX OINT
TOPICAL_OINTMENT | CUTANEOUS | Status: AC
  Administered 2013-07-30: 1 via NASAL
  Filled 2013-07-30: qty 22

## 2013-07-30 MED ORDER — DEXAMETHASONE 4 MG PO TABS
4.0000 mg | ORAL_TABLET | Freq: Four times a day (QID) | ORAL | Status: DC
  Administered 2013-07-30 – 2013-07-31 (×3): 4 mg via ORAL
  Filled 2013-07-30 (×7): qty 1

## 2013-07-30 MED ORDER — CARBIDOPA-LEVODOPA 25-100 MG PO TABS
0.5000 | ORAL_TABLET | Freq: Three times a day (TID) | ORAL | Status: DC
  Administered 2013-07-30 – 2013-07-31 (×3): 0.5 via ORAL
  Filled 2013-07-30 (×5): qty 0.5

## 2013-07-30 MED ORDER — MORPHINE SULFATE 2 MG/ML IJ SOLN
1.0000 mg | INTRAMUSCULAR | Status: DC | PRN
  Administered 2013-07-30: 2 mg via INTRAVENOUS
  Administered 2013-07-30 (×2): 4 mg via INTRAVENOUS
  Administered 2013-07-31 (×2): 2 mg via INTRAVENOUS
  Filled 2013-07-30: qty 2
  Filled 2013-07-30: qty 1
  Filled 2013-07-30: qty 2
  Filled 2013-07-30 (×2): qty 1

## 2013-07-30 MED ORDER — ARTIFICIAL TEARS OP OINT
TOPICAL_OINTMENT | OPHTHALMIC | Status: DC | PRN
  Administered 2013-07-30: 1 via OPHTHALMIC

## 2013-07-30 MED ORDER — NITROGLYCERIN 0.4 MG SL SUBL: 0.4000 mg | SUBLINGUAL_TABLET | SUBLINGUAL | Status: DC | PRN

## 2013-07-30 MED ORDER — OXYCODONE-ACETAMINOPHEN 5-325 MG PO TABS: 1.0000 | ORAL_TABLET | ORAL | Status: DC | PRN

## 2013-07-30 MED ORDER — GABAPENTIN 300 MG PO CAPS
300.0000 mg | ORAL_CAPSULE | Freq: Every day | ORAL | Status: DC
  Administered 2013-07-30 – 2013-07-31 (×2): 300 mg via ORAL
  Filled 2013-07-30 (×2): qty 1

## 2013-07-30 MED ORDER — METHOCARBAMOL 500 MG PO TABS
500.0000 mg | ORAL_TABLET | Freq: Four times a day (QID) | ORAL | Status: DC | PRN
  Administered 2013-07-30 – 2013-07-31 (×2): 500 mg via ORAL
  Filled 2013-07-30: qty 1

## 2013-07-30 MED ORDER — BUPIVACAINE HCL (PF) 0.25 % IJ SOLN
INTRAMUSCULAR | Status: DC | PRN
  Administered 2013-07-30: 4 mL

## 2013-07-30 MED ORDER — ONDANSETRON HCL 4 MG/2ML IJ SOLN
4.0000 mg | INTRAMUSCULAR | Status: DC | PRN
  Administered 2013-07-31 (×3): 4 mg via INTRAVENOUS
  Filled 2013-07-30 (×3): qty 2

## 2013-07-30 MED ORDER — MUPIROCIN 2 % EX OINT
TOPICAL_OINTMENT | Freq: Two times a day (BID) | CUTANEOUS | Status: DC
  Administered 2013-07-30: 1 via NASAL
  Filled 2013-07-30: qty 22

## 2013-07-30 MED ORDER — HYDROMORPHONE HCL PF 1 MG/ML IJ SOLN
INTRAMUSCULAR | Status: AC
  Filled 2013-07-30: qty 1

## 2013-07-30 MED ORDER — 0.9 % SODIUM CHLORIDE (POUR BTL) OPTIME
TOPICAL | Status: DC | PRN
  Administered 2013-07-30: 1000 mL

## 2013-07-30 MED ORDER — GLYCOPYRROLATE 0.2 MG/ML IJ SOLN
INTRAMUSCULAR | Status: DC | PRN
  Administered 2013-07-30: 0.4 mg via INTRAVENOUS

## 2013-07-30 MED ORDER — DEXAMETHASONE SODIUM PHOSPHATE 4 MG/ML IJ SOLN
4.0000 mg | Freq: Four times a day (QID) | INTRAMUSCULAR | Status: DC
  Administered 2013-07-31: 4 mg via INTRAVENOUS
  Filled 2013-07-30 (×5): qty 1

## 2013-07-30 MED ORDER — METOPROLOL TARTRATE 25 MG PO TABS
37.5000 mg | ORAL_TABLET | Freq: Every day | ORAL | Status: DC
  Administered 2013-07-30: 37.5 mg via ORAL
  Filled 2013-07-30 (×2): qty 1

## 2013-07-30 MED ORDER — ONDANSETRON HCL 4 MG/2ML IJ SOLN
INTRAMUSCULAR | Status: DC | PRN
  Administered 2013-07-30: 4 mg via INTRAVENOUS

## 2013-07-30 MED ORDER — POTASSIUM CHLORIDE IN NACL 20-0.9 MEQ/L-% IV SOLN
INTRAVENOUS | Status: DC
  Administered 2013-07-30: 22:00:00 via INTRAVENOUS
  Filled 2013-07-30 (×4): qty 1000

## 2013-07-30 MED ORDER — THROMBIN 5000 UNITS EX SOLR
CUTANEOUS | Status: DC | PRN
  Administered 2013-07-30 (×2): 5000 [IU] via TOPICAL

## 2013-07-30 MED ORDER — SODIUM CHLORIDE 0.9 % IV SOLN: 250.0000 mL | INTRAVENOUS | Status: DC

## 2013-07-30 MED ORDER — SODIUM CHLORIDE 0.9 % IJ SOLN: 3.0000 mL | INTRAMUSCULAR | Status: DC | PRN

## 2013-07-30 MED ORDER — METHOCARBAMOL 500 MG PO TABS
ORAL_TABLET | ORAL | Status: AC
  Filled 2013-07-30: qty 1

## 2013-07-30 MED ORDER — OXYCODONE HCL 5 MG PO TABS
ORAL_TABLET | ORAL | Status: AC
  Filled 2013-07-30: qty 1

## 2013-07-30 MED ORDER — METHOCARBAMOL 100 MG/ML IJ SOLN
500.0000 mg | Freq: Four times a day (QID) | INTRAMUSCULAR | Status: DC | PRN
  Filled 2013-07-30 (×2): qty 5

## 2013-07-30 MED ORDER — CEFAZOLIN SODIUM 1-5 GM-% IV SOLN
1.0000 g | Freq: Three times a day (TID) | INTRAVENOUS | Status: AC
  Administered 2013-07-30 (×2): 1 g via INTRAVENOUS
  Filled 2013-07-30 (×2): qty 50

## 2013-07-30 MED ORDER — HEMOSTATIC AGENTS (NO CHARGE) OPTIME
TOPICAL | Status: DC | PRN
  Administered 2013-07-30: 1 via TOPICAL

## 2013-07-30 MED ORDER — SODIUM CHLORIDE 0.9 % IR SOLN
Status: DC | PRN
  Administered 2013-07-30: 12:00:00

## 2013-07-30 MED ORDER — FENTANYL CITRATE 0.05 MG/ML IJ SOLN
INTRAMUSCULAR | Status: DC | PRN
  Administered 2013-07-30: 100 ug via INTRAVENOUS

## 2013-07-30 MED ORDER — NEOSTIGMINE METHYLSULFATE 1 MG/ML IJ SOLN
INTRAMUSCULAR | Status: DC | PRN
  Administered 2013-07-30: 3 mg via INTRAVENOUS

## 2013-07-30 MED ORDER — ACETAMINOPHEN 325 MG PO TABS: 650.0000 mg | ORAL_TABLET | ORAL | Status: DC | PRN

## 2013-07-30 MED ORDER — HYDROMORPHONE HCL PF 1 MG/ML IJ SOLN
0.2500 mg | INTRAMUSCULAR | Status: DC | PRN
  Administered 2013-07-30 (×2): 0.5 mg via INTRAVENOUS

## 2013-07-30 MED ORDER — SODIUM CHLORIDE 0.9 % IJ SOLN
3.0000 mL | Freq: Two times a day (BID) | INTRAMUSCULAR | Status: DC
  Administered 2013-07-31: 3 mL via INTRAVENOUS

## 2013-07-30 MED ORDER — ACETAMINOPHEN 650 MG RE SUPP: 650.0000 mg | RECTAL | Status: DC | PRN

## 2013-07-30 MED ORDER — LISINOPRIL 2.5 MG PO TABS
1.2500 mg | ORAL_TABLET | Freq: Every day | ORAL | Status: DC
  Administered 2013-07-30: 1.25 mg via ORAL
  Filled 2013-07-30 (×3): qty 0.5

## 2013-07-30 MED ORDER — THROMBIN 5000 UNITS EX SOLR
OROMUCOSAL | Status: DC | PRN
  Administered 2013-07-30: 12:00:00 via TOPICAL

## 2013-07-30 MED ORDER — ROCURONIUM BROMIDE 100 MG/10ML IV SOLN
INTRAVENOUS | Status: DC | PRN
  Administered 2013-07-30: 50 mg via INTRAVENOUS

## 2013-07-30 MED ORDER — OXYCODONE HCL 5 MG/5ML PO SOLN: 5.0000 mg | Freq: Once | ORAL | Status: AC | PRN

## 2013-07-30 MED ORDER — LIDOCAINE HCL (CARDIAC) 20 MG/ML IV SOLN
INTRAVENOUS | Status: DC | PRN
  Administered 2013-07-30: 40 mg via INTRAVENOUS

## 2013-07-30 MED ORDER — MIDAZOLAM HCL 5 MG/5ML IJ SOLN
INTRAMUSCULAR | Status: DC | PRN
  Administered 2013-07-30: 2 mg via INTRAVENOUS

## 2013-07-30 SURGICAL SUPPLY — 44 items
BAG DECANTER FOR FLEXI CONT (MISCELLANEOUS) ×2 IMPLANT
BENZOIN TINCTURE PRP APPL 2/3 (GAUZE/BANDAGES/DRESSINGS) ×2 IMPLANT
BUR MATCHSTICK NEURO 3.0 LAGG (BURR) ×2 IMPLANT
CAGE SMALL 7X13X15 (Cage) ×2 IMPLANT
CANISTER SUCT 3000ML (MISCELLANEOUS) ×2 IMPLANT
CONT SPEC 4OZ CLIKSEAL STRL BL (MISCELLANEOUS) ×2 IMPLANT
DRAPE C-ARM 42X72 X-RAY (DRAPES) ×4 IMPLANT
DRAPE LAPAROTOMY 100X72 PEDS (DRAPES) ×2 IMPLANT
DRAPE MICROSCOPE ZEISS OPMI (DRAPES) ×2 IMPLANT
DRAPE POUCH INSTRU U-SHP 10X18 (DRAPES) ×2 IMPLANT
DRESSING TELFA 8X3 (GAUZE/BANDAGES/DRESSINGS) ×2 IMPLANT
DRILL BIT HELIX (BIT) ×2 IMPLANT
DRSG OPSITE 4X5.5 SM (GAUZE/BANDAGES/DRESSINGS) ×2 IMPLANT
DURAPREP 6ML APPLICATOR 50/CS (WOUND CARE) ×2 IMPLANT
ELECT COATED BLADE 2.86 ST (ELECTRODE) ×2 IMPLANT
ELECT REM PT RETURN 9FT ADLT (ELECTROSURGICAL) ×2
ELECTRODE REM PT RTRN 9FT ADLT (ELECTROSURGICAL) ×1 IMPLANT
GAUZE SPONGE 4X4 16PLY XRAY LF (GAUZE/BANDAGES/DRESSINGS) IMPLANT
GLOVE BIO SURGEON STRL SZ8 (GLOVE) ×2 IMPLANT
GOWN BRE IMP SLV AUR LG STRL (GOWN DISPOSABLE) ×2 IMPLANT
GOWN BRE IMP SLV AUR XL STRL (GOWN DISPOSABLE) ×2 IMPLANT
GOWN STRL REIN 2XL LVL4 (GOWN DISPOSABLE) ×2 IMPLANT
HEMOSTAT POWDER KIT SURGIFOAM (HEMOSTASIS) ×2 IMPLANT
HEMOSTAT POWDER SURGIFOAM 1G (HEMOSTASIS) ×2 IMPLANT
KIT BASIN OR (CUSTOM PROCEDURE TRAY) ×2 IMPLANT
KIT ROOM TURNOVER OR (KITS) ×2 IMPLANT
NEEDLE HYPO 25X1 1.5 SAFETY (NEEDLE) ×2 IMPLANT
NEEDLE SPNL 20GX3.5 QUINCKE YW (NEEDLE) ×2 IMPLANT
NS IRRIG 1000ML POUR BTL (IV SOLUTION) ×2 IMPLANT
PACK LAMINECTOMY NEURO (CUSTOM PROCEDURE TRAY) ×2 IMPLANT
PAD ARMBOARD 7.5X6 YLW CONV (MISCELLANEOUS) ×2 IMPLANT
PLATE ARCHON 1-LEVEL 22MM (Plate) ×2 IMPLANT
PUTTY ABX ACTIFUSE 1.5ML (Putty) ×2 IMPLANT
RUBBERBAND STERILE (MISCELLANEOUS) ×4 IMPLANT
SCREW ARCHON SELFTAP 4.0X13 (Screw) ×8 IMPLANT
SPONGE INTESTINAL PEANUT (DISPOSABLE) ×2 IMPLANT
SPONGE SURGIFOAM ABS GEL SZ50 (HEMOSTASIS) ×2 IMPLANT
STRIP CLOSURE SKIN 1/2X4 (GAUZE/BANDAGES/DRESSINGS) ×2 IMPLANT
SUT VIC AB 3-0 SH 8-18 (SUTURE) ×2 IMPLANT
SYR 20ML ECCENTRIC (SYRINGE) ×2 IMPLANT
TOWEL OR 17X24 6PK STRL BLUE (TOWEL DISPOSABLE) ×2 IMPLANT
TOWEL OR 17X26 10 PK STRL BLUE (TOWEL DISPOSABLE) ×2 IMPLANT
TRAP SPECIMEN MUCOUS 40CC (MISCELLANEOUS) ×2 IMPLANT
WATER STERILE IRR 1000ML POUR (IV SOLUTION) ×2 IMPLANT

## 2013-07-30 NOTE — Anesthesia Procedure Notes (Signed)
Procedure Name: Intubation Date/Time: 07/30/2013 11:23 AM Performed by: Jerilee Hoh Pre-anesthesia Checklist: Emergency Drugs available, Patient identified, Suction available and Patient being monitored Patient Re-evaluated:Patient Re-evaluated prior to inductionOxygen Delivery Method: Circle system utilized Preoxygenation: Pre-oxygenation with 100% oxygen Intubation Type: IV induction Ventilation: Mask ventilation without difficulty Laryngoscope Size: Mac and 3 Grade View: Grade II Tube type: Oral Tube size: 7.5 mm Number of attempts: 1 Airway Equipment and Method: Stylet and LTA kit utilized Placement Confirmation: ETT inserted through vocal cords under direct vision,  positive ETCO2 and breath sounds checked- equal and bilateral Secured at: 22 cm Tube secured with: Tape Dental Injury: Teeth and Oropharynx as per pre-operative assessment

## 2013-07-30 NOTE — H&P (Signed)
Subjective:   Patient is a 78 y.o. female admitted for ACDF C5-6. The patient first presented to me with complaints of neck pain. Onset of symptoms was several years ago. The pain is described as aching and occurs all day. The pain is rated severe, and is located  base of the neck and radiates to the arm left greater than right. The symptoms have been progressive. Symptoms are exacerbated by extending head backwards, and are relieved by none.  Previous work up includes MRI of cervical spine, results: disc bulge at C5-C6 bilateral.  Past Medical History  Diagnosis Date  . CAD (coronary artery disease)   . Hyperlipemia   . Restless legs   . Insulin resistance   . Hypertension   . Aneurysm     Right eye a non DES stent was placed so that she would not required long term dual antiplatelet therapy.  Marland Kitchen Hx of echocardiogram 11/2010    Ef 67% Normal size chambes, Aortic valve sclerosis without stenosis, No other significant valvular abnormalities, No percardial effusion.  Marland Kitchen History of stress test 03/2012    The post stress myocardial perfusion images show a normal pattern of perfusion in all region. The post left ventricles is normal in size. There is no scintigraphic of inductible myocardial ischemia. The post EF is 73  . Shingles   . Multiple thyroid nodules   . Anxiety     not currently taking any meds  . H/O hiatal hernia   . Neuromuscular disorder     nerve pain after shingles  . Arthritis     Past Surgical History  Procedure Laterality Date  . Angioplasty      Stenting of a 90% eccentric right coronary artery stenosis and had a 3.0x15 mm Integrity bare-metal stent inserted  . Cardiac catheterization      Showed a widely patent stent, she did have 60% ostial diagonal-1 stenosis, She also had mild luminal irregularities of her LAD.  Marland Kitchen Abdominal hysterectomy  1970  . Refractive surgery  2011  . Colonoscopy    . Coronary angioplasty    . Eye surgery  2009    cateract surgery-  bilateral  . Tonsillectomy    . Appendectomy    . Breast surgery      cysts removed from left breast (in her 20'2)    Allergies  Allergen Reactions  . Codeine Nausea And Vomiting and Other (See Comments)    Severe constipation.    History  Substance Use Topics  . Smoking status: Former Smoker -- 2.00 packs/day for 33 years    Types: Cigarettes  . Smokeless tobacco: Never Used     Comment: quit in 1995.  Marland Kitchen Alcohol Use: Yes     Comment: occasional (maybe twice a year)    Family History  Problem Relation Age of Onset  . Alzheimer's disease Mother 35  . Heart attack Father 54  . Cancer Maternal Grandmother 58  . Diabetes Sister    Prior to Admission medications   Medication Sig Start Date End Date Taking? Authorizing Provider  acetaminophen (TYLENOL) 500 MG tablet Take 500 mg by mouth 3 (three) times daily as needed for mild pain.   Yes Historical Provider, MD  aspirin 81 MG tablet Take 81 mg by mouth daily.   Yes Historical Provider, MD  carbidopa-levodopa (SINEMET IR) 25-100 MG per tablet Take one-fourth tablet two times daily   Yes Historical Provider, MD  Cyanocobalamin (VITAMIN B-12 CR) 1500 MCG TBCR Take 1 tablet  by mouth daily.   Yes Historical Provider, MD  docusate sodium (STOOL SOFTENER) 100 MG capsule Take 100 mg by mouth 2 (two) times daily. Takes 1 in the am and 2 in the evening   Yes Historical Provider, MD  fish oil-omega-3 fatty acids 1000 MG capsule Take 1,200 g by mouth daily.    Yes Historical Provider, MD  gabapentin (NEURONTIN) 300 MG capsule Take 300 mg by mouth daily.    Yes Historical Provider, MD  ibuprofen (ADVIL) 200 MG tablet Take 200 mg by mouth 3 (three) times daily as needed for mild pain.   Yes Historical Provider, MD  lisinopril (PRINIVIL,ZESTRIL) 2.5 MG tablet Take 0.5 mg by mouth daily.   Yes Historical Provider, MD  Magnesium 250 MG TABS Take 500 mg by mouth at bedtime.   Yes Historical Provider, MD  metoprolol tartrate (LOPRESSOR) 25 MG tablet  Take 37.5 mg by mouth daily. Take 1 and 1/2 tablet daily   Yes Historical Provider, MD  Polyethyl Glycol-Propyl Glycol (SYSTANE OP) Apply 1 drop to eye daily as needed.   Yes Historical Provider, MD  rosuvastatin (CRESTOR) 40 MG tablet Take 40 mg by mouth daily.   Yes Historical Provider, MD  traZODone (DESYREL) 100 MG tablet Take 100 mg by mouth at bedtime.   Yes Historical Provider, MD  vitamin C (ASCORBIC ACID) 500 MG tablet Take 500 mg by mouth daily.   Yes Historical Provider, MD  nitroGLYCERIN (NITROSTAT) 0.4 MG SL tablet Place 0.4 mg under the tongue every 5 (five) minutes as needed for chest pain.    Historical Provider, MD     Review of Systems  Positive ROS: neg  All other systems have been reviewed and were otherwise negative with the exception of those mentioned in the HPI and as above.  Objective: Vital signs in last 24 hours: Temp:  [97.5 F (36.4 C)] 97.5 F (36.4 C) (12/31 1004) Pulse Rate:  [54] 54 (12/31 1004) Resp:  [16] 16 (12/31 1004) BP: (164)/(63) 164/63 mmHg (12/31 1004) SpO2:  [98 %] 98 % (12/31 1004)  General Appearance: Alert, cooperative, no distress, appears stated age Head: Normocephalic, without obvious abnormality, atraumatic Eyes: PERRL, conjunctiva/corneas clear, EOM's intact      Neck: Supple, symmetrical, trachea midline, Back: Symmetric, no curvature, ROM normal, no CVA tenderness Lungs:  respirations unlabored Heart: Regular rate and rhythm Abdomen: Soft, non-tender Extremities: Extremities normal, atraumatic, no cyanosis or edema Pulses: 2+ and symmetric all extremities Skin: Skin color, texture, turgor normal, no rashes or lesions  NEUROLOGIC:  Mental status: Alert and oriented x4, no aphasia, good attention span, fund of knowledge and memory  Motor Exam - grossly normal Sensory Exam - grossly normal Reflexes: 1+ Coordination - grossly normal Gait - grossly normal Balance - grossly normal Cranial Nerves: I: smell Not tested  II:  visual acuity  OS: nl    OD: nl  II: visual fields Full to confrontation  II: pupils Equal, round, reactive to light  III,VII: ptosis None  III,IV,VI: extraocular muscles  Full ROM  V: mastication Normal  V: facial light touch sensation  Normal  V,VII: corneal reflex  Present  VII: facial muscle function - upper  Normal  VII: facial muscle function - lower Normal  VIII: hearing Not tested  IX: soft palate elevation  Normal  IX,X: gag reflex Present  XI: trapezius strength  5/5  XI: sternocleidomastoid strength 5/5  XI: neck flexion strength  5/5  XII: tongue strength  Normal  Data Review Lab Results  Component Value Date   WBC 5.0 07/30/2013   HGB 13.6 07/30/2013   HCT 40.7 07/30/2013   MCV 92.3 07/30/2013   PLT 194 07/30/2013   Lab Results  Component Value Date   NA 140 07/30/2013   K 4.5 07/30/2013   CL 104 07/30/2013   CO2 23 07/30/2013   BUN 26* 07/30/2013   CREATININE 0.77 07/30/2013   GLUCOSE 117* 07/30/2013   Lab Results  Component Value Date   INR 1.04 02/02/2011    Assessment:   Cervical neck pain with herniated nucleus pulposus/ spondylosis/ stenosis at C5-6. Patient has failed conservative therapy. Planned surgery : ACDF C5-6 with plate  Plan:   I explained the condition and procedure to the patient and answered any questions.  Patient wishes to proceed with procedure as planned. Understands risks/ benefits/ and expected or typical outcomes.  Findley Blankenbaker S 07/30/2013 11:12 AM

## 2013-07-30 NOTE — Progress Notes (Signed)
Utilization review completed.  

## 2013-07-30 NOTE — Transfer of Care (Signed)
Immediate Anesthesia Transfer of Care Note  Patient: [name]  Procedure(s) Performed: Procedure(s): ANTERIOR CERVICAL DECOMPRESSION/DISCECTOMY FUSION CERVICAL FIVE -SIX (N/A)  Patient Location: PACU  Anesthesia Type:General  Level of Consciousness: sedated and responds to stimulation  Airway & Oxygen Therapy: Patient Spontanous Breathing and Patient connected to nasal cannula oxygen  Post-op Assessment: Report given to PACU RN, Post -op Vital signs reviewed and stable and Patient moving all extremities  Post vital signs: Reviewed and stable  Complications: No apparent anesthesia complications

## 2013-07-30 NOTE — Op Note (Signed)
07/30/2013  1:23 PM  PATIENT:  [name]  78 y.o. female  PRE-OPERATIVE DIAGNOSIS:  Cervical spondylosis with cervical disc herniation C5-6  POST-OPERATIVE DIAGNOSIS:  Same  PROCEDURE:  1. Decompressive anterior cervical discectomy C5-6, 2. Anterior cervical arthrodesis C5-6 utilizing a 7 mm peek interbody cage packed with local autograft and morcellized allograft, 3. Anterior cervical plating C5-6 utilizing a Archon plate  SURGEON:  Marikay Alar, MD  ASSISTANTS: None  ANESTHESIA:   General  EBL: Less than 50 ml  Total I/O In: 1300 [I.V.:1300] Out: 50 [Blood:50]  BLOOD ADMINISTERED:none  DRAINS: None   SPECIMEN:  No Specimen  INDICATION FOR PROCEDURE: This is a 32 her old female who presented with a several year history of neck pain and left arm pain. She had an MRI which showed cervical disc herniation C5-6. She tried medical management without relief. I recommended ACDF with plating at C5-6. Patient understood the risks, benefits, and alternatives and potential outcomes and wished to proceed.  PROCEDURE DETAILS: Patient was brought to the operating room placed under general endotracheal anesthesia. Patient was placed in the supine position on the operating room table. The neck was prepped with Duraprep and draped in a sterile fashion.   Three cc of local anesthesia was injected and a transverse incision was made on the right side of the neck.  Dissection was carried down thru the subcutaneous tissue and the platysma was  elevated, opened, and undermined with Metzenbaum scissors.  Dissection was then carried out thru an avascular plane leaving the sternocleidomastoid carotid artery and jugular vein laterally and the trachea and esophagus medially. The ventral aspect of the vertebral column was identified and a localizing x-ray was taken. The C5-6 level was identified. The longus colli muscles were then elevated and the retractor was placed. The annulus was incised and the  disc space entered. Discectomy was performed with micro-curettes and pituitary rongeurs. I then used the high-speed drill to drill the endplates down to the level of the posterior longitudinal ligament. The drill shavings were saved in a mucous trap for later arthrodesis. The operating microscope was draped and brought into the field provided additional magnification, illumination and visualization. Discectomy was continued posteriorly thru the disc space. Posterior longitudinal ligament was opened with a nerve hook, and then removed along with disc herniation and osteophytes, decompressing the spinal canal and thecal sac. We then continued to remove osteophytic overgrowth and disc material decompressing the neural foramina and exiting nerve roots bilaterally. The scope was angled up and down to help decompress and undercut the vertebral bodies. Once the decompression was completed we could pass a nerve hook circumferentially to assure adequate decompression in the midline and in the neural foramina. So by both visualization and palpation we felt we had an adequate decompression of the neural elements. We then measured the height of the intravertebral disc space and selected a 7 millimeter Peek interbody cage packed with autograft and morcellized allograft. It was then gently positioned in the intravertebral disc space and countersunk. I then used a Nuvasive archon plate and placed four variable angle screws into the vertebral bodies and locked them into position. The wound was irrigated with bacitracin solution, checked for hemostasis which was established and confirmed. Once meticulous hemostasis was achieved, we then proceeded with closure. The platysma was closed with interrupted 3-0 undyed Vicryl suture, the subcuticular layer was closed with interrupted 3-0 undyed Vicryl suture. The skin edges were approximated with steristrips. The drapes were removed. A sterile dressing  was applied. The patient was then  awakened from general anesthesia and transferred to the recovery room in stable condition. At the end of the procedure all sponge, needle and instrument counts were correct.   PLAN OF CARE: Admit to inpatient   PATIENT DISPOSITION:  PACU - hemodynamically stable.   Delay start of Pharmacological VTE agent (>24hrs) due to surgical blood loss or risk of bleeding:  yes

## 2013-07-30 NOTE — Progress Notes (Signed)
Patient ID: [name], female   DOB: [birth year], 78 y.o.   MRN: [id number] Looks good post-op. No arm pain, Good strength

## 2013-07-30 NOTE — Anesthesia Preprocedure Evaluation (Signed)
Anesthesia Evaluation  Patient identified by MRN, date of birth, ID band Patient awake    Reviewed: Allergy & Precautions, H&P , NPO status , Patient's Chart, lab work & pertinent test results  Airway Mallampati: II  Neck ROM: full    Dental   Pulmonary former smoker,          Cardiovascular hypertension, + CAD     Neuro/Psych Anxiety  Neuromuscular disease    GI/Hepatic hiatal hernia,   Endo/Other    Renal/GU      Musculoskeletal  (+) Arthritis -,   Abdominal   Peds  Hematology   Anesthesia Other Findings   Reproductive/Obstetrics                           Anesthesia Physical Anesthesia Plan  ASA: III  Anesthesia Plan: General   Post-op Pain Management:    Induction: Intravenous  Airway Management Planned: Oral ETT  Additional Equipment:   Intra-op Plan:   Post-operative Plan: Extubation in OR  Informed Consent: I have reviewed the patients History and Physical, chart, labs and discussed the procedure including the risks, benefits and alternatives for the proposed anesthesia with the patient or authorized representative who has indicated his/her understanding and acceptance.     Plan Discussed with: CRNA, Anesthesiologist and Surgeon  Anesthesia Plan Comments:         Anesthesia Quick Evaluation

## 2013-07-30 NOTE — Preoperative (Signed)
Beta Blockers   Reason not to administer Beta Blockers:Hold  beta blocker due to Bradycardia (HR less than 50 bpm) 

## 2013-07-30 NOTE — Anesthesia Postprocedure Evaluation (Signed)
Anesthesia Post Note  Patient: [name]  Procedure(s) Performed: Procedure(s) (LRB): ANTERIOR CERVICAL DECOMPRESSION/DISCECTOMY FUSION CERVICAL FIVE -SIX (N/A)  Anesthesia type: General  Patient location: PACU  Post pain: Pain level controlled and Adequate analgesia  Post assessment: Post-op Vital signs reviewed, Patient's Cardiovascular Status Stable, Respiratory Function Stable, Patent Airway and Pain level controlled  Last Vitals:  Filed Vitals:   07/30/13 1400  BP:   Pulse: 62  Temp:   Resp: 19    Post vital signs: Reviewed and stable  Level of consciousness: awake, alert  and oriented  Complications: No apparent anesthesia complications

## 2013-07-31 MED ORDER — HYDROMORPHONE HCL 2 MG PO TABS
2.0000 mg | ORAL_TABLET | ORAL | Status: DC | PRN
  Administered 2013-07-31: 2 mg via ORAL
  Filled 2013-07-31: qty 1

## 2013-07-31 MED ORDER — METHOCARBAMOL 500 MG PO TABS: 500.0000 mg | ORAL_TABLET | Freq: Four times a day (QID) | ORAL | Status: DC | PRN

## 2013-07-31 MED ORDER — PROMETHAZINE HCL 25 MG PO TABS: 25.0000 mg | ORAL_TABLET | Freq: Four times a day (QID) | ORAL | Status: DC | PRN

## 2013-07-31 MED ORDER — HYDROMORPHONE HCL 2 MG PO TABS: 2.0000 mg | ORAL_TABLET | ORAL | Status: DC | PRN

## 2013-07-31 NOTE — Discharge Summary (Signed)
Physician Discharge Summary  Patient ID: [name] MRN: [id number] DOB/AGE: 77-Aug-1936 77 y.o.  Admit date: 07/30/2013 Discharge date: 07/31/2013  Admission Diagnoses: C5-6 disc degeneration,, spondylosis Thomas cervicalgia, cervical radiculopathy  Discharge Diagnoses: The same Active Problems:   S/P cervical spinal fusion   Discharged Condition: good  Hospital Course: Dr. Ronnald Ramp performed a C5-6 anterior cervical discectomy, fusion, and plating on 07/30/2013. The surgery went well.  The patient's postoperative course was remarkable only for some persistent nausea. At postop day #1 the patient requested discharge to home. She was given oral and written discharge instructions. All her questions were answered.  Consults: None Significant Diagnostic Studies: None Treatments: C5-C6 anterior cervicectomy, fusion, and plating. Discharge Exam: Blood pressure 131/45, pulse 49, temperature 97.9 F (36.6 C), temperature source Oral, resp. rate 20, height 5\' 5"  (1.651 m), SpO2 95.00%. Patient is alert and pleasant. She looks well. Her dressing is clean and dry. There is no evidence of hematoma or shift. Her strength is normal.  Disposition: Home  Discharge Orders   Future Orders Complete By Expires   Call MD for:  difficulty breathing, headache or visual disturbances  As directed    Call MD for:  extreme fatigue  As directed    Call MD for:  hives  As directed    Call MD for:  persistant dizziness or light-headedness  As directed    Call MD for:  persistant nausea and vomiting  As directed    Call MD for:  redness, tenderness, or signs of infection (pain, swelling, redness, odor or green/yellow discharge around incision site)  As directed    Call MD for:  severe uncontrolled pain  As directed    Call MD for:  temperature >100.4  As directed    Diet - low sodium heart healthy  As directed    Discharge instructions  As directed    Comments:     Call [phone] for a  followup appointment. Take a stool softener while you are using pain medications.   Driving Restrictions  As directed    Comments:     Do not drive for 2 weeks.   Increase activity slowly  As directed    Lifting restrictions  As directed    Comments:     Do not lift more than 5 pounds. No excessive bending or twisting.   May shower / Bathe  As directed    Comments:     He may shower after the pain she is removed 3 days after surgery. Leave the incision alone.   Remove dressing in 48 hours  As directed    Comments:     Your stitches are under the scan and will dissolve by themselves. The Steri-Strips will fall off after you take a few showers. Do not rub back or pick at the wound, Leave the wound alone.       Medication List    STOP taking these medications       acetaminophen 500 MG tablet  Commonly known as:  TYLENOL     ADVIL 200 MG tablet  Generic drug:  ibuprofen      TAKE these medications       aspirin 81 MG tablet  Take 81 mg by mouth daily.     carbidopa-levodopa 25-100 MG per tablet  Commonly known as:  SINEMET IR  Take one-fourth tablet two times daily     fish oil-omega-3 fatty acids 1000 MG capsule  Take 1,200 g by mouth daily.  gabapentin 300 MG capsule  Commonly known as:  NEURONTIN  Take 300 mg by mouth daily.     HYDROmorphone 2 MG tablet  Commonly known as:  DILAUDID  Take 1 tablet (2 mg total) by mouth every 4 (four) hours as needed for severe pain.     lisinopril 2.5 MG tablet  Commonly known as:  PRINIVIL,ZESTRIL  Take 1.25 mg by mouth daily.     Magnesium 250 MG Tabs  Take 500 mg by mouth at bedtime.     methocarbamol 500 MG tablet  Commonly known as:  ROBAXIN  Take 1 tablet (500 mg total) by mouth every 6 (six) hours as needed for muscle spasms.     metoprolol tartrate 25 MG tablet  Commonly known as:  LOPRESSOR  Take 37.5 mg by mouth daily. Take 1 and 1/2 tablet daily     nitroGLYCERIN 0.4 MG SL tablet  Commonly known as:   NITROSTAT  Place 0.4 mg under the tongue every 5 (five) minutes as needed for chest pain.     promethazine 25 MG tablet  Commonly known as:  PHENERGAN  Take 1 tablet (25 mg total) by mouth every 6 (six) hours as needed for nausea.     rosuvastatin 40 MG tablet  Commonly known as:  CRESTOR  Take 40 mg by mouth daily.     STOOL SOFTENER 100 MG capsule  Generic drug:  docusate sodium  Take 100 mg by mouth 2 (two) times daily. Takes 1 in the am and 2 in the evening     SYSTANE OP  Apply 1 drop to eye daily as needed.     traZODone 100 MG tablet  Commonly known as:  DESYREL  Take 100 mg by mouth at bedtime.     Vitamin B-12 CR 1500 MCG Tbcr  Take 1 tablet by mouth daily.     vitamin C 500 MG tablet  Commonly known as:  ASCORBIC ACID  Take 500 mg by mouth daily.         SignedOphelia Charter 07/31/2013, 10:41 AM

## 2013-07-31 NOTE — Progress Notes (Signed)
Patient d/ced this afternoon, assessments remain unchanged prior to discharge. Discharge papers and prescriptions given and all questions answered. Was wheeled down accompanied by family members.

## 2013-08-04 ENCOUNTER — Encounter (HOSPITAL_COMMUNITY): Payer: Self-pay | Admitting: Neurological Surgery

## 2013-10-02 ENCOUNTER — Ambulatory Visit (INDEPENDENT_AMBULATORY_CARE_PROVIDER_SITE_OTHER): Payer: Medicare Other | Admitting: Family Medicine

## 2013-10-02 VITALS — BP 146/92 | HR 58 | Temp 97.8°F | Resp 18 | Ht 65.0 in | Wt 190.0 lb

## 2013-10-02 DIAGNOSIS — L723 Sebaceous cyst: Secondary | ICD-10-CM

## 2013-10-02 NOTE — Progress Notes (Signed)
This chart was scribed for Tina Haber, MD by Vernell Barrier, Medical Scribe. This patient's care was started at 5:45 PM  Patient ID: [name] MRN: [id number], DOB: Jun 19, 1935, 79 y.o. Date of Encounter: 10/02/2013, 5:45 PM  Primary Physician: Donnajean Lopes, MD  Chief Complaint: cyst behind right ear.  HPI: 79 y.o. year old female with history below presents with a cyst behind the right ear. Has been there for years and never bothered her up until recently. Pt states it was larger and she stuck a needle in to drain which decreased the size as well as some of the pressure. Currently taking doxycycline; last took 2 today. Has had one behind the left ear years ago which was cut open and drained. PCP Dr. [name] at [place].   Past Medical History  Diagnosis Date   CAD (coronary artery disease)    Hyperlipemia    Restless legs    Insulin resistance    Hypertension    Aneurysm     Right eye a non DES stent was placed so that she would not required long term dual antiplatelet therapy.   Hx of echocardiogram 11/2010    Ef 67% Normal size chambes, Aortic valve sclerosis without stenosis, No other significant valvular abnormalities, No percardial effusion.   History of stress test 03/2012    The post stress myocardial perfusion images show a normal pattern of perfusion in all region. The post left ventricles is normal in size. There is no scintigraphic of inductible myocardial ischemia. The post EF is 51   Shingles    Multiple thyroid nodules    Anxiety     not currently taking any meds   H/O hiatal hernia    Neuromuscular disorder     nerve pain after shingles   Arthritis      Home Meds: Prior to Admission medications   Medication Sig Start Date End Date Taking? Authorizing Provider  aspirin 81 MG tablet Take 81 mg by mouth daily.   Yes Historical Provider, MD  carbidopa-levodopa (SINEMET IR) 25-100 MG per tablet Take one-fourth tablet two  times daily   Yes Historical Provider, MD  docusate sodium (STOOL SOFTENER) 100 MG capsule Take 100 mg by mouth 2 (two) times daily. Takes 1 in the am and 2 in the evening   Yes Historical Provider, MD  fish oil-omega-3 fatty acids 1000 MG capsule Take 1,200 g by mouth daily.    Yes Historical Provider, MD  gabapentin (NEURONTIN) 300 MG capsule Take 300 mg by mouth daily.    Yes Historical Provider, MD  lisinopril (PRINIVIL,ZESTRIL) 2.5 MG tablet Take 1.25 mg by mouth daily.   Yes Historical Provider, MD  Magnesium 250 MG TABS Take 500 mg by mouth at bedtime.   Yes Historical Provider, MD  metoprolol tartrate (LOPRESSOR) 25 MG tablet Take 37.5 mg by mouth daily. Take 1 and 1/2 tablet daily   Yes Historical Provider, MD  Polyethyl Glycol-Propyl Glycol (SYSTANE OP) Apply 1 drop to eye daily as needed.   Yes Historical Provider, MD  rosuvastatin (CRESTOR) 40 MG tablet Take 40 mg by mouth daily.   Yes Historical Provider, MD  traZODone (DESYREL) 100 MG tablet Take 100 mg by mouth at bedtime.   Yes Historical Provider, MD  vitamin C (ASCORBIC ACID) 500 MG tablet Take 500 mg by mouth daily.   Yes Historical Provider, MD  nitroGLYCERIN (NITROSTAT) 0.4 MG SL tablet Place 0.4 mg under the tongue every 5 (five) minutes as needed for  chest pain.    Historical Provider, MD    Allergies:  Allergies  Allergen Reactions   Codeine Nausea And Vomiting and Other (See Comments)    Severe constipation.    History   Social History   Marital Status: Married    Spouse Name: N/A    Number of Children: N/A   Years of Education: N/A   Occupational History   Not on file.   Social History Main Topics   Smoking status: Former Smoker -- 2.00 packs/day for 33 years    Types: Cigarettes   Smokeless tobacco: Never Used     Comment: quit in 1995.   Alcohol Use: Yes     Comment: occasional (maybe twice a year)   Drug Use: No   Sexual Activity: Not on file   Other Topics Concern   Not on file    Social History Narrative   No narrative on file     Review of Systems: Constitutional: negative for chills, fever, night sweats, weight changes, or fatigue  HEENT: negative for vision changes, hearing loss, congestion, rhinorrhea, ST, epistaxis, or sinus pressure, positive for right ear cyst Cardiovascular: negative for chest pain or palpitations Respiratory: negative for hemoptysis, wheezing, shortness of breath, or cough Abdominal: negative for abdominal pain, nausea, vomiting, diarrhea, or constipation Dermatological: negative for rash Neurologic: negative for headache, dizziness, or syncope All other systems reviewed and are otherwise negative with the exception to those above and in the HPI.   Physical Exam Blood pressure 146/92, pulse 58, temperature 97.8 F (36.6 C), temperature source Oral, resp. rate 18, height 5\' 5"  (1.651 m), weight 190 lb (86.183 kg), SpO2 93.00%., Body mass index is 31.62 kg/(m^2). General: Well developed, well nourished, in no acute distress. Head: Normocephalic, atraumatic, eyes without discharge, sclera non-icteric, nares are without discharge. Bilateral auditory canals clear, TM's are without perforation, pearly grey and translucent with reflective cone of light bilaterally. Oral cavity moist, posterior pharynx without exudate, erythema, peritonsillar abscess, or post nasal drip.  Neck: Supple. No thyromegaly. Full ROM. No lymphadenopathy. Lungs: Clear bilaterally to auscultation without wheezes, rales, or rhonchi. Breathing is unlabored. Heart: RRR with S1 S2. No murmurs, rubs, or gallops appreciated. Abdomen: Soft, non-tender, non-distended with normoactive bowel sounds. No hepatomegaly. No rebound/guarding. No obvious abdominal masses. Msk:  Strength and tone normal for age. Extremities/Skin: Warm and dry. No clubbing or cyanosis. No edema. No rashes or suspicious lesions. Neuro: Alert and oriented X 3. Moves all extremities spontaneously. Gait is  normal. CNII-XII grossly in tact. Psych:  Responds to questions appropriately with a normal affect.   Labs:   ASSESSMENT AND PLAN:  79 y.o. year old female with Sebaceous cyst of ear - Plan: Wound culture Continue the doxycycline, follow up as per PA note Signed, Tina Haber, MD     Signed, Tina Haber, MD 10/02/2013 5:45 PM

## 2013-10-02 NOTE — Progress Notes (Signed)
Patient ID: GELILA WELL MRN: [id number], DOB: 1934-11-14, 79 y.o. Date of Encounter: 10/02/2013, 6:08 PM    PROCEDURE NOTE: Verbal consent obtained. Local anesthesia obtained with 2% lidocaine plain.   0.5 cm incision made with 11 blade along lesion.  Culture taken. Moderate purulence expressed. Lesion explored revealing no loculations. Packed with 1/4 plain packing.  Dressed. Wound care instructions including precautions with patient. Patient tolerated the procedure well. Recheck in 48 hours.      Tina Nevin, PA-C 10/02/2013 6:08 PM

## 2013-10-04 ENCOUNTER — Ambulatory Visit (INDEPENDENT_AMBULATORY_CARE_PROVIDER_SITE_OTHER): Payer: Medicare Other | Admitting: Physician Assistant

## 2013-10-04 VITALS — BP 126/78 | HR 56 | Temp 97.7°F | Resp 17 | Ht 64.5 in | Wt 191.0 lb

## 2013-10-04 DIAGNOSIS — L723 Sebaceous cyst: Secondary | ICD-10-CM

## 2013-10-04 DIAGNOSIS — L089 Local infection of the skin and subcutaneous tissue, unspecified: Secondary | ICD-10-CM

## 2013-10-04 NOTE — Progress Notes (Signed)
Patient ID: [name] MRN: [id number], DOB: [birth year] 79 y.o. Date of Encounter: 10/04/2013, 8:17 AM  Primary Physician: Donnajean Lopes, MD  Chief Complaint: Wound care   See previous note  HPI: 78 y.o. female presents for wound care s/p I&D on 10/02/13 Doing well No issues or complaints Afebrile/ no chills No nausea or vomiting Tolerating doxycycline Pain much improved Daily dressing change Previous note reviewed  Past Medical History  Diagnosis Date  . CAD (coronary artery disease)   . Hyperlipemia   . Restless legs   . Insulin resistance   . Hypertension   . Aneurysm     Right eye a non DES stent was placed so that she would not required long term dual antiplatelet therapy.  Marland Kitchen Hx of echocardiogram 11/2010    Ef 67% Normal size chambes, Aortic valve sclerosis without stenosis, No other significant valvular abnormalities, No percardial effusion.  Marland Kitchen History of stress test 03/2012    The post stress myocardial perfusion images show a normal pattern of perfusion in all region. The post left ventricles is normal in size. There is no scintigraphic of inductible myocardial ischemia. The post EF is 73  . Shingles   . Multiple thyroid nodules   . Anxiety     not currently taking any meds  . H/O hiatal hernia   . Neuromuscular disorder     nerve pain after shingles  . Arthritis      Home Meds: Prior to Admission medications   Medication Sig Start Date End Date Taking? Authorizing Provider  aspirin 81 MG tablet Take 81 mg by mouth daily.   Yes Historical Provider, MD  carbidopa-levodopa (SINEMET IR) 25-100 MG per tablet Take one-fourth tablet two times daily   Yes Historical Provider, MD  docusate sodium (STOOL SOFTENER) 100 MG capsule Take 100 mg by mouth 2 (two) times daily. Takes 1 in the am and 2 in the evening   Yes Historical Provider, MD  fish oil-omega-3 fatty acids 1000 MG capsule Take 1,200 g by mouth daily.    Yes Historical Provider, MD  gabapentin  (NEURONTIN) 300 MG capsule Take 300 mg by mouth daily.    Yes Historical Provider, MD  lisinopril (PRINIVIL,ZESTRIL) 2.5 MG tablet Take 1.25 mg by mouth daily.   Yes Historical Provider, MD  Magnesium 250 MG TABS Take 500 mg by mouth at bedtime.   Yes Historical Provider, MD  metoprolol tartrate (LOPRESSOR) 25 MG tablet Take 37.5 mg by mouth daily. Take 1 and 1/2 tablet daily   Yes Historical Provider, MD  nitroGLYCERIN (NITROSTAT) 0.4 MG SL tablet Place 0.4 mg under the tongue every 5 (five) minutes as needed for chest pain.   Yes Historical Provider, MD  Polyethyl Glycol-Propyl Glycol (SYSTANE OP) Apply 1 drop to eye daily as needed.   Yes Historical Provider, MD  rosuvastatin (CRESTOR) 40 MG tablet Take 40 mg by mouth daily.   Yes Historical Provider, MD  traZODone (DESYREL) 100 MG tablet Take 100 mg by mouth at bedtime.   Yes Historical Provider, MD  vitamin C (ASCORBIC ACID) 500 MG tablet Take 500 mg by mouth daily.   Yes Historical Provider, MD    Allergies:  Allergies  Allergen Reactions  . Codeine Nausea And Vomiting and Other (See Comments)    Severe constipation.    ROS: Constitutional: Afebrile, no chills Dermatological: Positive for wound and improved pain. Negative for erythema, or warmth  GI: No nausea or vomiting   EXAM: Physical Exam: Blood  pressure 126/78, pulse 56, temperature 97.7 F (36.5 C), temperature source Oral, resp. rate 17, height 5' 4.5" (1.638 m), weight 191 lb (86.637 kg), SpO2 96.00%., Body mass index is 32.29 kg/(m^2). General: Well developed, well nourished, in no acute distress. Nontoxic appearing. Head: Normocephalic, atraumatic, sclera non-icteric.  Neck: Supple. Lungs: Breathing is unlabored. Heart: Normal rate. Skin:  Warm and moist. Dressing and packing in place. No induration, erythema, or tenderness to palpation. Neuro: Alert and oriented X 3. Moves all extremities spontaneously. Normal gait.  Psych:  Responds to questions appropriately with  a normal affect.   PROCEDURE: Dressing and packing removed. No purulence expressed Wound bed healthy Irrigated with 1% plain lidocaine 5 cc. Repacked with small amount of 1/4 inch plain packing Dressing applied  LAB: Culture: pending  A/P: 79 y.o. female with cellulitis/abscess as above s/p I&D on 10/02/13 -Wound care per above -Continue doxycycline -Pain well controlled -Daily dressing changes -Recheck 48 hours -Likely last visit  Signed, Christell Faith, MHS, PA-C [facility], [place] [id number] [place] 10/04/2013 8:17 AM

## 2013-10-05 LAB — WOUND CULTURE

## 2013-10-06 ENCOUNTER — Ambulatory Visit (INDEPENDENT_AMBULATORY_CARE_PROVIDER_SITE_OTHER): Payer: Medicare Other | Admitting: Physician Assistant

## 2013-10-06 ENCOUNTER — Encounter: Payer: Self-pay | Admitting: Cardiovascular Disease

## 2013-10-06 ENCOUNTER — Ambulatory Visit (INDEPENDENT_AMBULATORY_CARE_PROVIDER_SITE_OTHER): Payer: Medicare Other | Admitting: Cardiovascular Disease

## 2013-10-06 VITALS — BP 130/56 | HR 52 | Temp 97.7°F | Resp 16 | Ht 65.0 in | Wt 191.0 lb

## 2013-10-06 VITALS — BP 118/68 | HR 55 | Ht 65.0 in | Wt 189.4 lb

## 2013-10-06 DIAGNOSIS — G2581 Restless legs syndrome: Secondary | ICD-10-CM

## 2013-10-06 DIAGNOSIS — L089 Local infection of the skin and subcutaneous tissue, unspecified: Secondary | ICD-10-CM

## 2013-10-06 DIAGNOSIS — R011 Cardiac murmur, unspecified: Secondary | ICD-10-CM

## 2013-10-06 DIAGNOSIS — I2510 Atherosclerotic heart disease of native coronary artery without angina pectoris: Secondary | ICD-10-CM

## 2013-10-06 DIAGNOSIS — I10 Essential (primary) hypertension: Secondary | ICD-10-CM

## 2013-10-06 DIAGNOSIS — Z981 Arthrodesis status: Secondary | ICD-10-CM

## 2013-10-06 DIAGNOSIS — L723 Sebaceous cyst: Secondary | ICD-10-CM

## 2013-10-06 DIAGNOSIS — E785 Hyperlipidemia, unspecified: Secondary | ICD-10-CM

## 2013-10-06 NOTE — Progress Notes (Signed)
Patient ID: [name] MRN: [id number], DOB: Jan 12, 1935 79 y.o. Date of Encounter: 10/06/2013, 12:51 PM  Chief Complaint: Wound care   See previous note  HPI: 79 y.o. y/o female presents for wound care s/p I&D on 10/02/13. Doing well No issues or complaints Afebrile/ no chills No nausea or vomiting Tolerating doxycycline.  Pain resolved.  Daily dressing change Previous note reviewed  Past Medical History  Diagnosis Date  . CAD (coronary artery disease)   . Hyperlipemia   . Restless legs   . Insulin resistance   . Hypertension   . Aneurysm     Right eye a non DES stent was placed so that she would not required long term dual antiplatelet therapy.  Marland Kitchen Hx of echocardiogram 11/2010    Ef 67% Normal size chambes, Aortic valve sclerosis without stenosis, No other significant valvular abnormalities, No percardial effusion.  Marland Kitchen History of stress test 03/2012    The post stress myocardial perfusion images show a normal pattern of perfusion in all region. The post left ventricles is normal in size. There is no scintigraphic of inductible myocardial ischemia. The post EF is 73  . Shingles   . Multiple thyroid nodules   . Anxiety     not currently taking any meds  . H/O hiatal hernia   . Neuromuscular disorder     nerve pain after shingles  . Arthritis      Home Meds: Prior to Admission medications   Medication Sig Start Date End Date Taking? Authorizing Provider  ALPRAZolam Duanne Moron) 0.5 MG tablet Take 1 tablet by mouth as needed. 09/11/13  Yes Historical Provider, MD  aspirin 81 MG tablet Take 81 mg by mouth daily.   Yes Historical Provider, MD  carbidopa-levodopa (SINEMET IR) 25-100 MG per tablet Take one-fourth tablet two times daily   Yes Historical Provider, MD  docusate sodium (STOOL SOFTENER) 100 MG capsule Take 100 mg by mouth 2 (two) times daily. Takes 1 in the am and 2 in the evening   Yes Historical Provider, MD  doxycycline (VIBRAMYCIN) 100 MG capsule Take 100 mg by  mouth 2 (two) times daily.   Yes Historical Provider, MD  fish oil-omega-3 fatty acids 1000 MG capsule Take 1,200 g by mouth daily.    Yes Historical Provider, MD  gabapentin (NEURONTIN) 300 MG capsule Take 300 mg by mouth 4 (four) times daily.    Yes Historical Provider, MD  lisinopril (PRINIVIL,ZESTRIL) 2.5 MG tablet Take 1.25 mg by mouth daily.   Yes Historical Provider, MD  Magnesium 250 MG TABS Take 500 mg by mouth at bedtime.   Yes Historical Provider, MD  metoprolol tartrate (LOPRESSOR) 25 MG tablet Take 37.5 mg by mouth daily. Take 1 and 1/2 tablet daily   Yes Historical Provider, MD  nitroGLYCERIN (NITROSTAT) 0.4 MG SL tablet Place 0.4 mg under the tongue every 5 (five) minutes as needed for chest pain.   Yes Historical Provider, MD  Polyethyl Glycol-Propyl Glycol (SYSTANE OP) Apply 1 drop to eye daily as needed.   Yes Historical Provider, MD  rosuvastatin (CRESTOR) 40 MG tablet Take 40 mg by mouth daily.   Yes Historical Provider, MD  traZODone (DESYREL) 100 MG tablet Take 100 mg by mouth at bedtime.   Yes Historical Provider, MD  vitamin C (ASCORBIC ACID) 500 MG tablet Take 500 mg by mouth daily.   Yes Historical Provider, MD    Allergies:  Allergies  Allergen Reactions  . Codeine Nausea And Vomiting and Other (See  Comments)    Severe constipation.    ROS: Constitutional: Afebrile, no chills Cardiovascular: negative for chest pain or palpitations Dermatological: Positive for wound. Negative for erythema, pain, or warmth GI: No nausea or vomiting   EXAM: Physical Exam: Blood pressure 130/56, pulse 52, temperature 97.7 F (36.5 C), temperature source Oral, resp. rate 16, height 5\' 5"  (1.651 m), weight 191 lb (86.637 kg), SpO2 96.00%., Body mass index is 31.78 kg/(m^2). General: Well developed, well nourished, in no acute distress. Nontoxic appearing. Head: Normocephalic, atraumatic, sclera non-icteric.  Neck: Supple. Lungs: Breathing is unlabored. Heart: Normal rate. Skin:   Warm and moist. Dressing and packing in place. No induration, erythema, or tenderness to palpation. Neuro: Alert and oriented X 3. Moves all extremities spontaneously. Normal gait.  Psych:  Responds to questions appropriately with a normal affect.       PROCEDURE: Dressing and packing removed. No purulence expressed Wound bed healthy Not irrigated or repacked.  Dressing applied  LAB: Culture: klebsiella  A/P: 79 y.o. y/o female with infected sebaceous cyst behind right ear s/p I&D on 10/02/13.  Wound care per above Continue doxycycline Pain well controlled Daily dressing changes Recheck as needed.   Angela Nevin, PA-C 10/06/2013 12:51 PM

## 2013-10-06 NOTE — Patient Instructions (Signed)
Your physician recommends that you schedule a follow-up appointment in: 1 year.no changes were made today.

## 2013-10-26 ENCOUNTER — Encounter: Payer: Self-pay | Admitting: Cardiovascular Disease

## 2013-10-26 NOTE — Progress Notes (Signed)
Patient ID: [name], female   DOB: 1935/04/09, 79 y.o.   MRN: [id number]      HPI: [name] is a 79 y.o. female who presents to the office for four-month cardiology evaluation.  [name] has known coronary artery disease in March 2012 underwent stenting of a 90% eccentric RCA stenosis with a 3.0x15 mm integrity bare-metal stent. She developed recurrent chest pain in July 2012 and repeat catheterization revealed widely patent stent. She did have 60% postural diagonal 1 stenosis and medical therapy was recommended. She had mild luminal irregularities of the LAD.  In 2012 an echo Doppler study did show normal systolic function with mild aortic sclerosis mild MR and mild TR. She has a history of hyperlipidemia, restless legs, as well as documented insulin resistance.  Since I last saw her, she developed a herniated disc in her neck and underwent surgery by Dr. Sherley Bounds on November 30 1 2014. She tolerated surgery well from a cardiovascular standpoint.  Recently, she denies episodes of chest pain or shortness of breath. She does have nerve damage from an episode of shingles which is improved with Neurontin. She does have restless legs which she takes Sinemet 25/100, one quarter of a tablet 2 times per day. She does have hyperlipidemia. She does have hypertension. She denies presyncope or syncope.  Past Medical History  Diagnosis Date  . CAD (coronary artery disease)   . Hyperlipemia   . Restless legs   . Insulin resistance   . Hypertension   . Aneurysm     Right eye a non DES stent was placed so that she would not required long term dual antiplatelet therapy.  Marland Kitchen Hx of echocardiogram 11/2010    Ef 67% Normal size chambes, Aortic valve sclerosis without stenosis, No other significant valvular abnormalities, No percardial effusion.  Marland Kitchen History of stress test 03/2012    The post stress myocardial perfusion images show a normal pattern of perfusion in all region. The  post left ventricles is normal in size. There is no scintigraphic of inductible myocardial ischemia. The post EF is 73  . Shingles   . Multiple thyroid nodules   . Anxiety     not currently taking any meds  . H/O hiatal hernia   . Neuromuscular disorder     nerve pain after shingles  . Arthritis     Past Surgical History  Procedure Laterality Date  . Angioplasty      Stenting of a 90% eccentric right coronary artery stenosis and had a 3.0x15 mm Integrity bare-metal stent inserted  . Cardiac catheterization      Showed a widely patent stent, she did have 60% ostial diagonal-1 stenosis, She also had mild luminal irregularities of her LAD.  Marland Kitchen Abdominal hysterectomy  1970  . Refractive surgery  2011  . Colonoscopy    . Coronary angioplasty    . Eye surgery  2009    cateract surgery- bilateral  . Tonsillectomy    . Appendectomy    . Breast surgery      cysts removed from left breast (in her 20'2)  . Anterior cervical decomp/discectomy fusion N/A 07/30/2013    Procedure: ANTERIOR CERVICAL DECOMPRESSION/DISCECTOMY FUSION CERVICAL FIVE -SIX;  Surgeon: Eustace Moore, MD;  Location: [facility];  Service: Neurosurgery;  Laterality: N/A;    Allergies  Allergen Reactions  . Codeine Nausea And Vomiting and Other (See Comments)    Severe constipation.    Current Outpatient Prescriptions  Medication Sig  Dispense Refill  . ALPRAZolam (XANAX) 0.5 MG tablet Take 1 tablet by mouth as needed.      Marland Kitchen aspirin 81 MG tablet Take 81 mg by mouth daily.      . carbidopa-levodopa (SINEMET IR) 25-100 MG per tablet Take one-fourth tablet two times daily      . docusate sodium (STOOL SOFTENER) 100 MG capsule Take 100 mg by mouth 2 (two) times daily. Takes 1 in the am and 2 in the evening      . doxycycline (VIBRAMYCIN) 100 MG capsule Take 100 mg by mouth 2 (two) times daily.      . fish oil-omega-3 fatty acids 1000 MG capsule Take 1,200 g by mouth daily.       Marland Kitchen gabapentin (NEURONTIN) 300 MG capsule  Take 300 mg by mouth 4 (four) times daily.       Marland Kitchen lisinopril (PRINIVIL,ZESTRIL) 2.5 MG tablet Take 1.25 mg by mouth daily.      . Magnesium 250 MG TABS Take 500 mg by mouth at bedtime.      . metoprolol tartrate (LOPRESSOR) 25 MG tablet Take 37.5 mg by mouth daily. Take 1 and 1/2 tablet daily      . nitroGLYCERIN (NITROSTAT) 0.4 MG SL tablet Place 0.4 mg under the tongue every 5 (five) minutes as needed for chest pain.      [name] Glycol-Propyl Glycol (SYSTANE OP) Apply 1 drop to eye daily as needed.      . rosuvastatin (CRESTOR) 40 MG tablet Take 40 mg by mouth daily.      . traZODone (DESYREL) 100 MG tablet Take 100 mg by mouth at bedtime.      . vitamin C (ASCORBIC ACID) 500 MG tablet Take 500 mg by mouth daily.       No current facility-administered medications for this visit.    History   Social History  . Marital Status: Married    Spouse Name: N/A    Number of Children: N/A  . Years of Education: N/A   Occupational History  . Not on file.   Social History Main Topics  . Smoking status: Former Smoker -- 2.00 packs/day for 33 years    Types: Cigarettes  . Smokeless tobacco: Never Used     Comment: quit in 1995.  Marland Kitchen Alcohol Use: Yes     Comment: occasional (maybe twice a year)  . Drug Use: No  . Sexual Activity: Not on file   Other Topics Concern  . Not on file   Social History Narrative  . No narrative on file    Family History  Problem Relation Age of Onset  . Alzheimer's disease Mother 49  . Heart attack Father 21  . Cancer Maternal Grandmother 67  . Diabetes Sister    Socially she is married has one child and 2 grandchildren. There is no tobacco or alcohol use.  ROS is negative for fevers, chills or night sweats.  She denies visual changes. He denies difficulty with hearing. She is unaware of lymphadenopathy. She did have a cyst behind her ear last week for which she was placed on doxycycline. She denies cough or sputum production. She denies wheezing.  She denies PND or orthopnea. She does note some mild shortness of breath with activity. She denies any severe episode of chest pain but has noticed rare vague chest tightness. She denies abdominal pain. She denies nausea or vomiting. She denies blood in her stool or urine. She denies claudication. She does have  history of hyperlipidemia. She does have restless legs which is controlled with low-dose carbidopa/levodopa.  She does have GERD but this has been controlled. She denies edema Other comprehensive 14 point system review is negative.  PE BP 118/68  Pulse 55  Ht 5\' 5"  (1.651 m)  Wt 189 lb 6.4 oz (85.911 kg)  BMI 31.52 kg/m2  General: Alert, oriented, no distress.  Skin: normal turgor, no rashes HEENT: Normocephalic, atraumatic. Pupils round and reactive; sclera anicteric;no lid lag.  Nose without nasal septal hypertrophy Mouth/Parynx benign; Mallinpatti scale 2 Neck: No JVD, no carotid bruits with normal carotid upstroke. Lungs: clear to ausculatation and percussion; no wheezing or rales Heart: PMI is not displaced. RRR, s1 s2 normal 2/6 systolic murmur in aortic region; no IR. No diastolic murmur. No S4. No rubs thrills or heaves. Abdomen: soft, nontender; no hepatosplenomehaly, BS+; abdominal aorta nontender and not dilated by palpation. Pulses 2+ Extremities: no clubbing cyanosis or edema, Homan's sign negative  Neurologic: grossly nonfocal Psychologic: normal affect and mood  ECG (independently read by me): Sinus bradycardia 55 beats per minute. Nonspecific ST changes appear.  Prior 06/03/2013 ECG: Sinus rhythm with first degree AV block. QRS complex V1 V2. Nonspecific ST changes which are present previously. Borderline LVH by voltage in aVL  LABS:  BMET    Component Value Date/Time   NA 140 07/30/2013 1010   K 4.5 07/30/2013 1010   CL 104 07/30/2013 1010   CO2 23 07/30/2013 1010   GLUCOSE 117* 07/30/2013 1010   BUN 26* 07/30/2013 1010   CREATININE 0.77 07/30/2013 1010    CREATININE 0.90 06/13/2013 0838   CALCIUM 9.1 07/30/2013 1010   GFRNONAA 80* 07/30/2013 1010   GFRAA >90 07/30/2013 1010     Hepatic Function Panel     Component Value Date/Time   PROT 6.9 06/13/2013 0838   ALBUMIN 4.5 06/13/2013 0838   AST 23 06/13/2013 0838   ALT 24 06/13/2013 0838   ALKPHOS 57 06/13/2013 0838   BILITOT 0.5 06/13/2013 0838     CBC    Component Value Date/Time   WBC 5.0 07/30/2013 1010   RBC 4.41 07/30/2013 1010   HGB 13.6 07/30/2013 1010   HCT 40.7 07/30/2013 1010   PLT 194 07/30/2013 1010   MCV 92.3 07/30/2013 1010   MCH 30.8 07/30/2013 1010   MCHC 33.4 07/30/2013 1010   RDW 12.8 07/30/2013 1010   LYMPHSABS 2.3 02/01/2011 1845   MONOABS 0.4 02/01/2011 1845   EOSABS 0.1 02/01/2011 1845   BASOSABS 0.0 02/01/2011 1845     BNP    Component Value Date/Time   PROBNP 41.3 02/02/2011 0545    Lipid Panel     Component Value Date/Time   CHOL 151 02/02/2011 0545   TRIG 129 06/13/2013 0838   TRIG 150* 02/02/2011 0545   HDL 56 02/02/2011 0545   CHOLHDL 2.7 02/02/2011 0545   VLDL 30 02/02/2011 0545   LDLCALC 62 06/13/2013 0838   LDLCALC 65 02/02/2011 0545     RADIOLOGY: No results found.    ASSESSMENT AND PLAN:  [name] is a 69 years ol female who is 4 years status post stenting of her right coronary artery with a bare metal stent. She did have a  60% ostial diagonal stenosis. She has a history of hyperlipidemia. Physical exam does suggest possible aggression of her aortic sclerosis and possible mild stenosis. An echo Doppler study was done in December 2014 which showed an ejection fraction at 55-60% without wall motion  abnormalities. There was mild aortic stenosis. Mitral valve was structurally normal. She has noticed episodes of some shortness of breath particularly with activity. She recently underwent followup laboratory with an NMR profile in November. This showed an LDL particle #1031 with an LDL calculated 62 HDL level LIII triglycerides 129 total  cholesterol 141. She does have insulin resistance with an insulin resistance scored 57. Clinically, she is doing well. Her last nuclear perfusion study was done in 2013 which was normal. Her blood pressure today is stable. She is tolerating very low dose ACE inhibition. As long as she remains stable I will see her in one year for cardiology reevaluation.   Troy Sine, MD, [facility]  10/26/2013 4:45 PM

## 2014-01-01 ENCOUNTER — Other Ambulatory Visit: Payer: Self-pay | Admitting: Internal Medicine

## 2014-01-01 DIAGNOSIS — M542 Cervicalgia: Secondary | ICD-10-CM

## 2014-01-01 DIAGNOSIS — R1312 Dysphagia, oropharyngeal phase: Secondary | ICD-10-CM

## 2014-01-09 ENCOUNTER — Ambulatory Visit
Admission: RE | Admit: 2014-01-09 | Discharge: 2014-01-09 | Disposition: A | Discharge status: Home / Self Care | Payer: Medicare Other | Source: Ambulatory Visit | Attending: Internal Medicine | Admitting: Internal Medicine

## 2014-01-09 DIAGNOSIS — R1312 Dysphagia, oropharyngeal phase: Secondary | ICD-10-CM

## 2014-01-09 DIAGNOSIS — M542 Cervicalgia: Secondary | ICD-10-CM

## 2014-01-09 MED ORDER — GADOBENATE DIMEGLUMINE 529 MG/ML IV SOLN
17.0000 mL | Freq: Once | INTRAVENOUS | Status: AC | PRN
  Administered 2014-01-09: 17 mL via INTRAVENOUS

## 2014-06-29 DIAGNOSIS — M4310 Spondylolisthesis, site unspecified: Secondary | ICD-10-CM | POA: Insufficient documentation

## 2014-08-25 ENCOUNTER — Other Ambulatory Visit: Payer: Self-pay | Admitting: Dermatology

## 2014-10-13 DIAGNOSIS — M5417 Radiculopathy, lumbosacral region: Secondary | ICD-10-CM | POA: Insufficient documentation

## 2014-11-05 ENCOUNTER — Encounter (HOSPITAL_COMMUNITY): Payer: Self-pay

## 2014-11-05 ENCOUNTER — Ambulatory Visit (HOSPITAL_COMMUNITY)
Admission: RE | Admit: 2014-11-05 | Discharge: 2014-11-05 | Disposition: A | Discharge status: Home / Self Care | Payer: Medicare Other | Source: Ambulatory Visit | Attending: Internal Medicine | Admitting: Internal Medicine

## 2014-11-05 DIAGNOSIS — M810 Age-related osteoporosis without current pathological fracture: Secondary | ICD-10-CM | POA: Insufficient documentation

## 2014-11-05 HISTORY — DX: Age-related osteoporosis without current pathological fracture: M81.0

## 2014-11-05 MED ORDER — DENOSUMAB 60 MG/ML ~~LOC~~ SOLN
60.0000 mg | Freq: Once | SUBCUTANEOUS | Status: AC
  Administered 2014-11-05: 60 mg via SUBCUTANEOUS
  Filled 2014-11-05: qty 1

## 2014-11-05 NOTE — Discharge Instructions (Signed)
PROLIA °Denosumab injection °What is this medicine? °DENOSUMAB (den oh sue mab) slows bone breakdown. Prolia is used to treat osteoporosis in women after menopause and in men. Xgeva is used to prevent bone fractures and other bone problems caused by cancer bone metastases. Xgeva is also used to treat giant cell tumor of the bone. °This medicine may be used for other purposes; ask your health care provider or pharmacist if you have questions. °COMMON BRAND NAME(S): Prolia, XGEVA °What should I tell my health care provider before I take this medicine? °They need to know if you have any of these conditions: °-dental disease °-eczema °-infection or history of infections °-kidney disease or on dialysis °-low blood calcium or vitamin D °-malabsorption syndrome °-scheduled to have surgery or tooth extraction °-taking medicine that contains denosumab °-thyroid or parathyroid disease °-an unusual reaction to denosumab, other medicines, foods, dyes, or preservatives °-pregnant or trying to get pregnant °-breast-feeding °How should I use this medicine? °This medicine is for injection under the skin. It is given by a health care professional in a hospital or clinic setting. °If you are getting Prolia, a special MedGuide will be given to you by the pharmacist with each prescription and refill. Be sure to read this information carefully each time. °For Prolia, talk to your pediatrician regarding the use of this medicine in children. Special care may be needed. For Xgeva, talk to your pediatrician regarding the use of this medicine in children. While this drug may be prescribed for children as young as 13 years for selected conditions, precautions do apply. °Overdosage: If you think you've taken too much of this medicine contact a poison control center or emergency room at once. °Overdosage: If you think you have taken too much of this medicine contact a poison control center or emergency room at once. °NOTE: This medicine is only  for you. Do not share this medicine with others. °What if I miss a dose? °It is important not to miss your dose. Call your doctor or health care professional if you are unable to keep an appointment. °What may interact with this medicine? °Do not take this medicine with any of the following medications: °-other medicines containing denosumab °This medicine may also interact with the following medications: °-medicines that suppress the immune system °-medicines that treat cancer °-steroid medicines like prednisone or cortisone °This list may not describe all possible interactions. Give your health care provider a list of all the medicines, herbs, non-prescription drugs, or dietary supplements you use. Also tell them if you smoke, drink alcohol, or use illegal drugs. Some items may interact with your medicine. °What should I watch for while using this medicine? °Visit your doctor or health care professional for regular checks on your progress. Your doctor or health care professional may order blood tests and other tests to see how you are doing. °Call your doctor or health care professional if you get a cold or other infection while receiving this medicine. Do not treat yourself. This medicine may decrease your body's ability to fight infection. °You should make sure you get enough calcium and vitamin D while you are taking this medicine, unless your doctor tells you not to. Discuss the foods you eat and the vitamins you take with your health care professional. °See your dentist regularly. Brush and floss your teeth as directed. Before you have any dental work done, tell your dentist you are receiving this medicine. °Do not become pregnant while taking this medicine or for 5 months after   stopping it. Women should inform their doctor if they wish to become pregnant or think they might be pregnant. There is a potential for serious side effects to an unborn child. Talk to your health care professional or pharmacist for  more information. °What side effects may I notice from receiving this medicine? °Side effects that you should report to your doctor or health care professional as soon as possible: °-allergic reactions like skin rash, itching or hives, swelling of the face, lips, or tongue °-breathing problems °-chest pain °-fast, irregular heartbeat °-feeling faint or lightheaded, falls °-fever, chills, or any other sign of infection °-muscle spasms, tightening, or twitches °-numbness or tingling °-skin blisters or bumps, or is dry, peels, or red °-slow healing or unexplained pain in the mouth or jaw °-unusual bleeding or bruising °Side effects that usually do not require medical attention (Report these to your doctor or health care professional if they continue or are bothersome.): °-muscle pain °-stomach upset, gas °This list may not describe all possible side effects. Call your doctor for medical advice about side effects. You may report side effects to FDA at 1-800-FDA-1088. °Where should I keep my medicine? °This medicine is only given in a clinic, doctor's office, or other health care setting and will not be stored at home. °NOTE: This sheet is a summary. It may not cover all possible information. If you have questions about this medicine, talk to your doctor, pharmacist, or health care provider. °© 2015, [employer]. (2012-01-15 12:37:47) °Osteoporosis °Throughout your life, your body breaks down old bone and replaces it with new bone. As you get older, your body does not replace bone as quickly as it breaks it down. By the age of 30 years, most people begin to gradually lose bone because of the imbalance between bone loss and replacement. Some people lose more bone than others. Bone loss beyond a specified normal degree is considered osteoporosis.  °Osteoporosis affects the strength and durability of your bones. The inside of the ends of your bones and your flat bones, like the bones of your pelvis, look like  honeycomb, filled with tiny open spaces. As bone loss occurs, your bones become less dense. This means that the open spaces inside your bones become bigger and the walls between these spaces become thinner. This makes your bones weaker. Bones of a person with osteoporosis can become so weak that they can break (fracture) during minor accidents, such as a simple fall. °CAUSES  °The following factors have been associated with the development of osteoporosis: °· Smoking. °· Drinking more than 2 alcoholic drinks several days per week. °· Long-term use of certain medicines: °¨ Corticosteroids. °¨ Chemotherapy medicines. °¨ Thyroid medicines. °¨ Antiepileptic medicines. °¨ Gonadal hormone suppression medicine. °¨ Immunosuppression medicine. °· Being underweight. °· Lack of physical activity. °· Lack of exposure to the sun. This can lead to vitamin D deficiency. °· Certain medical conditions: °¨ Certain inflammatory bowel diseases, such as Crohn disease and ulcerative colitis. °¨ Diabetes. °¨ Hyperthyroidism. °¨ Hyperparathyroidism. °RISK FACTORS °Anyone can develop osteoporosis. However, the following factors can increase your risk of developing osteoporosis: °· Gender--Women are at higher risk than men. °· Age--Being older than 50 years increases your risk. °· Ethnicity--White and Asian people have an increased risk. °· Weight --Being extremely underweight can increase your risk of osteoporosis. °· Family history of osteoporosis--Having a family member who has developed osteoporosis can increase your risk. °SYMPTOMS  °Usually, people with osteoporosis have no symptoms.  °DIAGNOSIS  °Signs during   a physical exam that may prompt your caregiver to suspect osteoporosis include: °· Decreased height. This is usually caused by the compression of the bones that form your spine (vertebrae) because they have weakened and become fractured. °· A curving or rounding of the upper back (kyphosis). °To confirm signs of osteoporosis,  your caregiver may request a procedure that uses 2 low-dose X-ray beams with different levels of energy to measure your bone mineral density (dual-energy X-ray absorptiometry [DXA]). Also, your caregiver may check your level of vitamin D. °TREATMENT  °The goal of osteoporosis treatment is to strengthen bones in order to decrease the risk of bone fractures. There are different types of medicines available to help achieve this goal. Some of these medicines work by slowing the processes of bone loss. Some medicines work by increasing bone density. Treatment also involves making sure that your levels of calcium and vitamin D are adequate. °PREVENTION  °There are things you can do to help prevent osteoporosis. Adequate intake of calcium and vitamin D can help you achieve optimal bone mineral density. Regular exercise can also help, especially resistance and weight-bearing activities. If you smoke, quitting smoking is an important part of osteoporosis prevention. °MAKE SURE YOU: °· Understand these instructions. °· Will watch your condition. °· Will get help right away if you are not doing well or get worse. °FOR MORE INFORMATION °www.osteo.org and www.nof.org °Document Released: 04/26/2005 Document Revised: 11/11/2012 Document Reviewed: 07/01/2011 °ExitCare® Patient Information ©2015 [employer]. This information is not intended to replace advice given to you by your health care provider. Make sure you discuss any questions you have with your health care provider. ° ° °

## 2014-11-05 NOTE — Progress Notes (Signed)
Uneventful injection of Prolia. Pt states her last injection of this was at Dr Shon Baton office 6 months ago and she tolerated this well.Pt was given an appointment for future injection in 6 months and verbalized understanding.

## 2015-03-09 ENCOUNTER — Encounter: Payer: Self-pay | Admitting: Gastroenterology

## 2015-04-09 ENCOUNTER — Ambulatory Visit (INDEPENDENT_AMBULATORY_CARE_PROVIDER_SITE_OTHER): Payer: Medicare Other | Admitting: Cardiovascular Disease

## 2015-04-09 VITALS — BP 138/64 | HR 55 | Ht 64.5 in | Wt 168.6 lb

## 2015-04-09 DIAGNOSIS — I25709 Atherosclerosis of coronary artery bypass graft(s), unspecified, with unspecified angina pectoris: Secondary | ICD-10-CM

## 2015-04-09 DIAGNOSIS — R011 Cardiac murmur, unspecified: Secondary | ICD-10-CM

## 2015-04-09 DIAGNOSIS — I10 Essential (primary) hypertension: Secondary | ICD-10-CM

## 2015-04-09 DIAGNOSIS — E785 Hyperlipidemia, unspecified: Secondary | ICD-10-CM | POA: Diagnosis not present

## 2015-04-09 DIAGNOSIS — R0789 Other chest pain: Secondary | ICD-10-CM

## 2015-04-09 DIAGNOSIS — G2581 Restless legs syndrome: Secondary | ICD-10-CM

## 2015-04-09 DIAGNOSIS — Z79899 Other long term (current) drug therapy: Secondary | ICD-10-CM

## 2015-04-09 NOTE — Patient Instructions (Signed)
Your physician has recommended you make the following change in your medication: increase the lisinopril to 1 pill daily.  Your physician has requested that you have en exercise stress myoview. For further information please visit [email or web address]. Please follow instruction sheet, as given.  Your physician recommends that you return for lab work fasting.  Your physician wants you to follow-up in: 6 months or sooner if needed. You will receive a reminder letter in the mail two months in advance. If you don't receive a letter, please call our office to schedule the follow-up appointment.

## 2015-04-10 ENCOUNTER — Encounter: Payer: Self-pay | Admitting: Cardiovascular Disease

## 2015-04-10 NOTE — Progress Notes (Signed)
Patient ID: ELAIJAH [name], female   DOB: 1934-09-28, 80 y.o.   MRN: [id number]      HPI: [name] is a 80 y.o. female who presents to the office for an 47-monthcardiology evaluation.  [name] known CAD and in March 2012 underwent stenting of a 90% eccentric RCA stenosis with a 3.0x15 mm integrity bare-metal stent. She developed recurrent chest pain in July 2012 and repeat catheterization revealed widely patent stent. She did have 60% postural diagonal 1 stenosis and medical therapy was recommended. She had mild luminal irregularities of the LAD.  In 2012 an echo Doppler study did show normal systolic function with mild aortic sclerosis mild MR and mild TR. She has a history of hyperlipidemia, restless legs, as well as documented insulin resistance.  She developed a herniated disc in her neck and underwent surgery by Dr. DSherley [name] November 30 1 2014. She tolerated surgery well from a cardiovascular standpoint.  She has mild nerve damage from an episode of shingles which is improved with Neurontin. She does have restless legs which she takes Sinemet 25/100, one quarter of a tablet 2 times per day. She does have hyperlipidemia. She does have hypertension. She denies presyncope or syncope.    Socially, she has noticed development of rare chest pain when she walks fast or walks up a steep incline.  She is supposed to be taking lisinopril 2.5 mg daily, but has only been taking 1.25 mg daily in addition to metoprolol 37.5 mg twice a day, aspirin, Crestor 40 mg and fish oil.  She denies shortness of breath.  She has not had recent laboratory.  Past Medical History  Diagnosis Date  . CAD (coronary artery disease)   . Hyperlipemia   . Restless legs   . Insulin resistance   . Hypertension   . Aneurysm     Right eye a non DES stent was placed so that she would not required long term dual antiplatelet therapy.  .Marland KitchenHx of echocardiogram 11/2010    Ef 67% Normal size  chambes, Aortic valve sclerosis without stenosis, No other significant valvular abnormalities, No percardial effusion.  .Marland KitchenHistory of stress test 03/2012    The post stress myocardial perfusion images show a normal pattern of perfusion in all region. The post left ventricles is normal in size. There is no scintigraphic of inductible myocardial ischemia. The post EF is 73  . Shingles   . Multiple thyroid nodules   . Anxiety     not currently taking any meds  . H/O hiatal hernia   . Neuromuscular disorder     nerve pain after shingles  . Arthritis   . Osteoporosis     Past Surgical History  Procedure Laterality Date  . Angioplasty      Stenting of a 90% eccentric right coronary artery stenosis and had a 3.0x15 mm Integrity bare-metal stent inserted  . Cardiac catheterization      Showed a widely patent stent, she did have 60% ostial diagonal-1 stenosis, She also had mild luminal irregularities of her LAD.  .Marland KitchenAbdominal hysterectomy  1970  . Refractive surgery  2011  . Colonoscopy    . Coronary angioplasty    . Eye surgery  2009    cateract surgery- bilateral  . Tonsillectomy    . Appendectomy    . Breast surgery      cysts removed from left breast (in her 20'2)  . Anterior cervical decomp/discectomy fusion N/A 07/30/2013  Procedure: ANTERIOR CERVICAL DECOMPRESSION/DISCECTOMY FUSION CERVICAL FIVE -SIX;  Surgeon: Tina Moore, MD;  Location: [facility];  Service: Neurosurgery;  Laterality: N/A;    Allergies  Allergen Reactions  . Codeine Nausea And Vomiting and Other (See Comments)    Severe constipation.    Current Outpatient Prescriptions  Medication Sig Dispense Refill  . ALPRAZolam (XANAX) 0.5 MG tablet Take 1 tablet by mouth as needed.    Marland Kitchen aspirin 81 MG tablet Take 81 mg by mouth daily.    . Biotin 5 MG CAPS Take 1 capsule by mouth. Pt states it is 1000 units dose    . carbidopa-levodopa (SINEMET IR) 25-100 MG per tablet Take one-fourth tablet two times daily    .  docusate sodium (STOOL SOFTENER) 100 MG capsule Take 100 mg by mouth 2 (two) times daily. Takes 1 in the am and 2 in the evening    . fish oil-omega-3 fatty acids 1000 MG capsule Take 1,200 g by mouth daily.     Marland Kitchen gabapentin (NEURONTIN) 300 MG capsule Take 300 mg by mouth once.     Marland Kitchen lisinopril (PRINIVIL,ZESTRIL) 2.5 MG tablet Take 1.25 mg by mouth daily.    . Magnesium 250 MG TABS Take 500 mg by mouth at bedtime.    . metFORMIN (GLUCOPHAGE-XR) 500 MG 24 hr tablet Take 1 tablet by mouth daily.    . metoprolol tartrate (LOPRESSOR) 25 MG tablet Take 37.5 mg by mouth daily. Take 1 and 1/2 tablet daily    . Multiple Vitamins-Minerals (CENTRUM SILVER PO) Take 1 tablet by mouth daily.    . nitroGLYCERIN (NITROSTAT) 0.4 MG SL tablet Place 0.4 mg under the tongue every 5 (five) minutes as needed for chest pain.    [name] Glycol-Propyl Glycol (SYSTANE OP) Apply 1 drop to eye daily as needed.    . rosuvastatin (CRESTOR) 40 MG tablet Take 40 mg by mouth daily.    . traZODone (DESYREL) 100 MG tablet Take 100 mg by mouth at bedtime.    . vitamin C (ASCORBIC ACID) 500 MG tablet Take 500 mg by mouth daily.    . Vitamin D, Ergocalciferol, (DRISDOL) 50000 UNITS CAPS capsule Take 50,000 Units by mouth every 7 (seven) days.     No current facility-administered medications for this visit.    Social History   Social History  . Marital Status: Married    Spouse Name: N/A  . Number of Children: N/A  . Years of Education: N/A   Occupational History  . Not on file.   Social History Main Topics  . Smoking status: Former Smoker -- 2.00 packs/day for 33 years    Types: Cigarettes  . Smokeless tobacco: Never Used     Comment: quit in 1995.  Marland Kitchen Alcohol Use: Yes     Comment: occasional (maybe twice a year)  . Drug Use: No  . Sexual Activity: Not on file   Other Topics Concern  . Not on file   Social History Narrative    Family History  Problem Relation Age of Onset  . Alzheimer's disease Mother  4  . Heart attack Father 51  . Cancer Maternal Grandmother 59  . Diabetes Sister    Socially she is married has one child and 2 grandchildren. There is no tobacco or alcohol use. ROS General: Negative; No fevers, chills, or night sweats;  HEENT: Negative; No changes in vision or hearing, sinus congestion, difficulty swallowing Pulmonary: Negative; No cough, wheezing, shortness of breath, hemoptysis Cardiovascular:  Negative; No chest pain, presyncope, syncope, palpitations GI: GERD; No nausea, vomiting, diarrhea, or abdominal pain GU: Negative; No dysuria, hematuria, or difficulty voiding Musculoskeletal: Negative; no myalgias, joint pain, or weakness Hematologic/Oncology: Negative; no easy bruising, bleeding Endocrine: Negative; no heat/cold intolerance; no diabetes Neuro: Positive for restless legs Skin: Negative; No rashes or skin lesions Psychiatric: Negative; No behavioral problems, depression Sleep: Negative; No snoring, daytime sleepiness, hypersomnolence, bruxism, restless legs, hypnogognic hallucinations, no cataplexy Other comprehensive 14 point system review is negative.  PE BP 138/64 mmHg  Pulse 55  Ht 5' 4.5" (1.638 m)  Wt 168 lb 9.6 oz (76.476 kg)  BMI 28.50 kg/m2  Wt Readings from Last 3 Encounters:  04/09/15 168 lb 9.6 oz (76.476 kg)  10/06/13 191 lb (86.637 kg)  10/06/13 189 lb 6.4 oz (85.911 kg)   General: Alert, oriented, no distress.  Skin: normal turgor, no rashes HEENT: Normocephalic, atraumatic. Pupils round and reactive; sclera anicteric;no lid lag.  Nose without nasal septal hypertrophy Mouth/Parynx benign; Mallinpatti scale 2 Neck: No JVD, no carotid bruits with normal carotid upstroke. Lungs: clear to ausculatation and percussion; no wheezing or rales Heart: PMI is not displaced. RRR, s1 s2 normal 2/6 systolic murmur in aortic region; no IR. No diastolic murmur. No S4. No rubs thrills or heaves. Abdomen: soft, nontender; no hepatosplenomehaly, BS+;  abdominal aorta nontender and not dilated by palpation. Pulses 2+ Extremities: no clubbing cyanosis or edema, Homan's sign negative  Neurologic: grossly nonfocal Psychologic: normal affect and mood  ECG (independently read by me): Sinus bradycardia at 55 bpm.  Nonspecific ST changes  March 2015 ECG (independently read by me): Sinus bradycardia 55 beats per minute. Nonspecific ST changes appear.  Prior 06/03/2013 ECG: Sinus rhythm with first degree AV block. QRS complex V1 V2. Nonspecific ST changes which are present previously. Borderline LVH by voltage in aVL  LABS: BMP Latest Ref Rng 07/30/2013 06/13/2013 02/02/2011  Glucose 70 - 99 mg/dL 117(H) 118(H) 102(H)  BUN 6 - 23 mg/dL 26(H) 21 16  Creatinine 0.50 - 1.10 mg/dL 0.77 0.90 0.70  Sodium 137 - 147 mEq/L 140 136 143  Potassium 3.7 - 5.3 mEq/L 4.5 4.7 3.8  Chloride 96 - 112 mEq/L 104 102 109  CO2 19 - 32 mEq/L _0 Calcium 8.4 - 10.5 mg/dL 9.1 9.9 9.4   Hepatic Function Latest Ref Rng 06/13/2013 02/02/2011 02/01/2011  Total Protein 6.0 - 8.3 g/dL 6.9 6.4 7.2  Albumin 3.5 - 5.2 g/dL 4.5 3.7 4.2  AST 0 - 37 U/L _1 ALT 0 - 35 U/L _2 Alk Phosphatase 39 - 117 U/L 57 86 95  Total Bilirubin 0.3 - 1.2 mg/dL 0.5 0.3 0.3   CBC Latest Ref Rng 07/30/2013 06/13/2013 02/02/2011  WBC 4.0 - 10.5 K/uL 5.0 3.9(L) 5.2  Hemoglobin 12.0 - 15.0 g/dL 13.6 13.2 12.7  Hematocrit 36.0 - 46.0 % 40.7 38.5 37.8  Platelets 150 - 400 K/uL 194 190 183   Lab Results  Component Value Date   MCV 92.3 07/30/2013   MCV 87.5 06/13/2013   MCV 87.7 02/02/2011   Lab Results  Component Value Date   TSH 0.899 06/13/2013   Lab Results  Component Value Date   HGBA1C 6.8* 02/02/2011   Lipid Panel     Component Value Date/Time   CHOL 141 06/13/2013 0838   CHOL 151 02/02/2011 0545   TRIG 129 06/13/2013 0838   TRIG 150* 02/02/2011 0545   HDL 53 06/13/2013  0838   HDL 56 02/02/2011 0545   CHOLHDL 2.7 02/02/2011 0545   VLDL 30 02/02/2011 0545     LDLCALC 62 06/13/2013 0838   LDLCALC 65 02/02/2011 0545     RADIOLOGY: No results found.    ASSESSMENT AND PLAN: [name] is a 34 years ol female who is 5 years status post stenting of her RCA with a bare metal stent. She did have a  60% ostial diagonal stenosis. She has a history of hyperlipidemia.  An echo Doppler study in December 2014  showed an ejection fraction at 55-60% without wall motion abnormalities. There was mild aortic stenosis. Mitral valve was structurally normal. She has noticed episodes of some shortness of breath particularly with activity and admits to experiencing some chest pressure with walking fast or up to walking.  Her last stress test was in 2013.  I am recommending a three-year follow-up nuclear perfusion study.  She apparently has only been taking lisinopril at 1.25 mg.  I've suggested she increase this back to 2.5 mg daily, which should more optimize her blood pressure.  She has not had recent laboratory and this will be checked in the fasting state.  Target LDL is less than 70 in this patient with established CAD.  Her restless leg syndrome is being treated with Sinemet.  I will contact her regarding her nuclear study results, and if abnormal, I will see her back sooner.  Otherwise, I will see her in 6 months for reevaluation.  Troy Sine, MD, [facility]  04/10/2015 7:50 PM

## 2015-04-14 LAB — LIPID PANEL
Cholesterol: 129 mg/dL (ref 125–200)
HDL: 57 mg/dL (ref >=46)
LDL Cholesterol: 55 mg/dL (ref <130)
Total CHOL/HDL Ratio: 2.3 Ratio (ref <=5.0)
Triglycerides: 87 mg/dL (ref <150)
VLDL: 17 mg/dL (ref <30)

## 2015-04-14 LAB — COMPREHENSIVE METABOLIC PANEL
ALT: 21 U/L (ref 6–29)
AST: 16 U/L (ref 10–35)
Albumin: 4.3 g/dL (ref 3.6–5.1)
Alkaline Phosphatase: 37 U/L (ref 33–130)
BUN: 19 mg/dL (ref 7–25)
CO2: 29 mmol/L (ref 20–31)
Calcium: 9.5 mg/dL (ref 8.6–10.4)
Chloride: 104 mmol/L (ref 98–110)
Creat: 0.81 mg/dL (ref 0.60–0.93)
Glucose, Bld: 109 mg/dL — ABNORMAL HIGH (ref 65–99)
Potassium: 4.6 mmol/L (ref 3.5–5.3)
Sodium: 139 mmol/L (ref 135–146)
Total Bilirubin: 0.5 mg/dL (ref 0.2–1.2)
Total Protein: 6.5 g/dL (ref 6.1–8.1)

## 2015-04-14 LAB — CBC
HCT: 40.8 % (ref 36.0–46.0)
Hemoglobin: 13.5 g/dL (ref 12.0–15.0)
MCH: 30.3 pg (ref 26.0–34.0)
MCHC: 33.1 g/dL (ref 30.0–36.0)
MCV: 91.5 fL (ref 78.0–100.0)
MPV: 9.3 fL (ref 8.6–12.4)
Platelets: 191 10*3/uL (ref 150–400)
RBC: 4.46 MIL/uL (ref 3.87–5.11)
RDW: 13.6 % (ref 11.5–15.5)
WBC: 6.8 10*3/uL (ref 4.0–10.5)

## 2015-04-14 LAB — TSH: TSH: 1.142 u[IU]/mL (ref 0.350–4.500)

## 2015-04-19 ENCOUNTER — Telehealth: Payer: Self-pay | Admitting: *Deleted

## 2015-04-19 ENCOUNTER — Telehealth (HOSPITAL_COMMUNITY): Payer: Self-pay | Admitting: *Deleted

## 2015-04-19 NOTE — Telephone Encounter (Signed)
Pt states that she would rather her results be mailed to her house instead being sent via [facility]. Thanks

## 2015-05-06 ENCOUNTER — Ambulatory Visit (HOSPITAL_COMMUNITY): Admission: RE | Admit: 2015-05-06 | Payer: Medicare Other | Source: Ambulatory Visit

## 2015-05-07 ENCOUNTER — Encounter: Payer: Self-pay | Admitting: Gastroenterology

## 2015-05-11 ENCOUNTER — Telehealth (HOSPITAL_COMMUNITY): Payer: Self-pay

## 2015-05-11 NOTE — Telephone Encounter (Signed)
Encounter complete. 

## 2015-05-12 ENCOUNTER — Encounter (HOSPITAL_COMMUNITY): Payer: Medicare Other

## 2015-05-12 ENCOUNTER — Telehealth (HOSPITAL_COMMUNITY): Payer: Self-pay

## 2015-05-12 NOTE — Telephone Encounter (Signed)
Encounter complete. 

## 2015-05-13 ENCOUNTER — Ambulatory Visit (HOSPITAL_COMMUNITY)
Admission: RE | Admit: 2015-05-13 | Discharge: 2015-05-13 | Disposition: A | Discharge status: Home / Self Care | Payer: Medicare Other | Source: Ambulatory Visit | Attending: Cardiovascular Disease | Admitting: Cardiovascular Disease

## 2015-05-13 DIAGNOSIS — I10 Essential (primary) hypertension: Secondary | ICD-10-CM | POA: Diagnosis not present

## 2015-05-13 DIAGNOSIS — R0789 Other chest pain: Secondary | ICD-10-CM

## 2015-05-13 DIAGNOSIS — I25709 Atherosclerosis of coronary artery bypass graft(s), unspecified, with unspecified angina pectoris: Secondary | ICD-10-CM

## 2015-05-13 DIAGNOSIS — R0609 Other forms of dyspnea: Secondary | ICD-10-CM | POA: Insufficient documentation

## 2015-05-13 DIAGNOSIS — Z8249 Family history of ischemic heart disease and other diseases of the circulatory system: Secondary | ICD-10-CM | POA: Insufficient documentation

## 2015-05-13 DIAGNOSIS — E119 Type 2 diabetes mellitus without complications: Secondary | ICD-10-CM | POA: Diagnosis not present

## 2015-05-13 DIAGNOSIS — R42 Dizziness and giddiness: Secondary | ICD-10-CM | POA: Diagnosis not present

## 2015-05-13 LAB — MYOCARDIAL PERFUSION IMAGING
Estimated workload: 7 METS
Exercise duration (min): 6 min
Exercise duration (sec): 0 s
LV dias vol: 70 mL
LV sys vol: 21 mL
MPHR: 142 {beats}/min
Peak HR: 144 {beats}/min
Percent HR: 101 %
RPE: 17
Rest HR: 50 {beats}/min
SDS: 0
SRS: 0
SSS: 0
TID: 1.21

## 2015-05-13 MED ORDER — TECHNETIUM TC 99M SESTAMIBI GENERIC - CARDIOLITE
30.6000 | Freq: Once | INTRAVENOUS | Status: AC | PRN
Start: 2015-05-13 — End: 2015-05-13
  Administered 2015-05-13: 30.6 via INTRAVENOUS

## 2015-05-13 MED ORDER — TECHNETIUM TC 99M SESTAMIBI GENERIC - CARDIOLITE
10.2000 | Freq: Once | INTRAVENOUS | Status: AC | PRN
  Administered 2015-05-13: 10.2 via INTRAVENOUS

## 2015-05-14 ENCOUNTER — Ambulatory Visit: Payer: Medicare Other | Admitting: Gastroenterology

## 2015-05-18 ENCOUNTER — Encounter: Payer: Self-pay | Admitting: *Deleted

## 2015-05-25 ENCOUNTER — Ambulatory Visit (HOSPITAL_COMMUNITY): Payer: Medicare Other

## 2015-07-07 ENCOUNTER — Ambulatory Visit: Payer: Medicare Other | Admitting: Gastroenterology

## 2015-09-30 ENCOUNTER — Telehealth: Payer: Self-pay | Admitting: Gastroenterology

## 2015-09-30 ENCOUNTER — Encounter: Payer: Self-pay | Admitting: Gastroenterology

## 2015-09-30 ENCOUNTER — Ambulatory Visit (INDEPENDENT_AMBULATORY_CARE_PROVIDER_SITE_OTHER): Payer: Medicare Other | Admitting: Gastroenterology

## 2015-09-30 VITALS — BP 120/60 | HR 52 | Ht 65.0 in | Wt 167.4 lb

## 2015-09-30 DIAGNOSIS — R194 Change in bowel habit: Secondary | ICD-10-CM | POA: Diagnosis not present

## 2015-09-30 DIAGNOSIS — K5909 Other constipation: Secondary | ICD-10-CM

## 2015-09-30 MED ORDER — NA SULFATE-K SULFATE-MG SULF 17.5-3.13-1.6 GM/177ML PO SOLN: 1.0000 | Freq: Once | ORAL | Status: DC

## 2015-09-30 NOTE — Progress Notes (Signed)
HPI :  81 y/o female presenting with abdominal pain. New patient visit, she has seen by Dr. Deatra Ina about 10 years ago but not since that time.   She reports having some changes in her bowel movements in recent months. She endorses chronic constipation at baseline, however over the past several months she has had interval worsening. Her symptoms have started since last August. When constipation worsened she passed "dry stools" and had a very hard time eliminating her stools, and difficulty passing anything at all at times. She does not see any blood in the stools. At baseline she has a bowel movement which is variable depending on what she takes for laxatives. She had relied on taking magnesium and colace historically to produce stools.   She has been taking miralax and benefiber in addition to magensium since November / October. She takes either one full dose to 1/2 dose of miralax daily She can have upwards of 3-4 BMs per day on miralax.  She has had some RLQ pain which comes and goes, she thinks it has been there since last fall. She thinks having a BM can take some of the pain away, and she also has increased gas. She has a lot of gas and bloating which bothers her. She has not had any major changes in her medications.   No FH of colon cancer. Mother and sister has had colon polyps.   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 She is concerned about a colon mass or polyps causing her symptoms and wishes to have a colonoscopy.    She has a history of CAD with a coronary stent placed 4 years ago. She denies any chest pains or shortness of breath. Good functional status. 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      Past Medical History  Diagnosis Date  . CAD (coronary artery disease)   . Hyperlipemia   . Restless legs   . Insulin resistance   . Hypertension   . Aneurysm  ([place]     Right eye a non DES stent was placed so that she would not required long term dual antiplatelet therapy.  .Marland KitchenHx of echocardiogram 11/2010    Ef 67% Normal size chambes, Aortic valve sclerosis without stenosis, No other significant valvular abnormalities, No percardial effusion.  .Marland KitchenHistory of stress test 03/2012    The post stress myocardial perfusion images show a normal pattern of perfusion in all region. The post left ventricles is normal in size. There is no scintigraphic of inductible myocardial ischemia. The post EF is 73  . Shingles   . Multiple thyroid nodules   . Anxiety     not currently taking any meds  . H/O hiatal hernia   . Neuromuscular disorder (HCC)     nerve pain after shingles  . Arthritis   . Osteoporosis      Past Surgical History  Procedure Laterality Date  . Angioplasty      Stenting of a 90% eccentric right coronary artery stenosis and had a 3.0x15 mm Integrity bare-metal stent inserted  . Cardiac catheterization      Showed a widely patent stent, she did have 60% ostial diagonal-1 stenosis, She also had mild luminal irregularities of her LAD.  .Marland KitchenAbdominal hysterectomy  1970  . Refractive surgery Left 2011  . Colonoscopy    .  Eye surgery Bilateral 2009    cateract surgery- bilateral  . Tonsillectomy    . Appendectomy    . Breast surgery Left     cysts removed from left breast (in her 20'2)  . Anterior cervical decomp/discectomy fusion N/A 07/30/2013    Procedure: ANTERIOR CERVICAL DECOMPRESSION/DISCECTOMY FUSION CERVICAL FIVE -SIX;  Surgeon: Eustace Moore, MD;  Location: [facility];  Service: Neurosurgery;  Laterality: N/A;   Family History  Problem Relation Age of Onset  . Alzheimer's disease Mother 61  . Heart attack Father 74  . Cancer Maternal Grandmother 83  . Diabetes Sister   . Breast cancer Maternal Grandmother   . Breast cancer Maternal Uncle   . Breast cancer Maternal Aunt     x 2  . Colon polyps Mother   . Colon polyps Sister    . Colon polyps Maternal Aunt   . Diabetes Mother    Social History  Substance Use Topics  . Smoking status: Former Smoker -- 2.00 packs/day for 33 years    Types: Cigarettes  . Smokeless tobacco: Never Used     Comment: quit in 1995.  Marland Kitchen Alcohol Use: 0.0 oz/week    0 Standard drinks or equivalent per week     Comment: occasional (maybe twice a year)   Current Outpatient Prescriptions  Medication Sig Dispense Refill  . ALPRAZolam (XANAX) 0.5 MG tablet Take 1 tablet by mouth as needed.    Marland Kitchen aspirin 81 MG tablet Take 81 mg by mouth daily.    . Biotin 5 MG CAPS Take 1 capsule by mouth. Pt states it is 1000 units dose    . carbidopa-levodopa (SINEMET IR) 25-100 MG per tablet Take one-fourth tablet two times daily    . docusate sodium (STOOL SOFTENER) 100 MG capsule Take 100 mg by mouth 2 (two) times daily. Takes 1 in the am and 2 in the evening    . fish oil-omega-3 fatty acids 1000 MG capsule Take 1,200 g by mouth daily.     Marland Kitchen gabapentin (NEURONTIN) 300 MG capsule Take 300 mg by mouth once.     Marland Kitchen lisinopril (PRINIVIL,ZESTRIL) 2.5 MG tablet Take 1.25 mg by mouth daily.    . Magnesium 250 MG TABS Take 500 mg by mouth at bedtime.    . metFORMIN (GLUCOPHAGE-XR) 500 MG 24 hr tablet Take 1 tablet by mouth daily.    . metoprolol tartrate (LOPRESSOR) 25 MG tablet Take 37.5 mg by mouth daily. Take 1 and 1/2 tablet daily    . Multiple Vitamins-Minerals (CENTRUM SILVER PO) Take 1 tablet by mouth daily.    . nitroGLYCERIN (NITROSTAT) 0.4 MG SL tablet Place 0.4 mg under the tongue every 5 (five) minutes as needed for chest pain.    [name] Glycol-Propyl Glycol (SYSTANE OP) Apply 1 drop to eye daily as needed.    . rosuvastatin (CRESTOR) 40 MG tablet Take 40 mg by mouth daily.    . traZODone (DESYREL) 100 MG tablet Take 100 mg by mouth at bedtime.    . Vitamin D, Ergocalciferol, (DRISDOL) 50000 UNITS CAPS capsule Take 50,000 Units by mouth every 7 (seven) days.    . Na Sulfate-K Sulfate-Mg Sulf  SOLN Take 1 kit by mouth once. 354 mL 0   No current facility-administered medications for this visit.   Allergies  Allergen Reactions  . Codeine Nausea And Vomiting and Other (See Comments)    Severe constipation.     Review of Systems: All systems reviewed and negative  except where noted in HPI.   Lab Results  Component Value Date   WBC 6.8 04/13/2015   HGB 13.5 04/13/2015   HCT 40.8 04/13/2015   MCV 91.5 04/13/2015   PLT 191 04/13/2015    Lab Results  Component Value Date   CREATININE 0.81 04/13/2015   BUN 19 04/13/2015   NA 139 04/13/2015   K 4.6 04/13/2015   CL 104 04/13/2015   CO2 29 04/13/2015    Lab Results  Component Value Date   ALT 21 04/13/2015   AST 16 04/13/2015   ALKPHOS 37 04/13/2015   BILITOT 0.5 04/13/2015     Physical Exam: BP 120/60 mmHg  Pulse 52  Ht '5\' 5"'  (1.651 m)  Wt 167 lb 6.4 oz (75.932 kg)  BMI 27.86 kg/m2 Constitutional: Pleasant,well-developed, female in no acute distress. HEENT: Normocephalic and atraumatic. Conjunctivae are normal. No scleral icterus. Neck supple.  Cardiovascular: Normal rate, regular rhythm.  Pulmonary/chest: Effort normal and breath sounds normal. No wheezing, rales or rhonchi. Abdominal: Soft, nondistended, nontender. Bowel sounds active throughout. There are no masses palpable. No hepatomegaly. Extremities: no edema Lymphadenopathy: No cervical adenopathy noted. Neurological: Alert and oriented to person place and time. Skin: Skin is warm and dry. No rashes noted. Psychiatric: Normal mood and affect. Behavior is normal.   ASSESSMENT AND PLAN: 81 y/o female with a history of CAD and chronic constipation, presenting with worsening constipation and RLQ pain for the past few months with increased bloating. She thinks her symptoms are a marked change for her over the past several months. Last colonoscopy 10 years ago showed a 74m leiomyoma which was removed via snare. The patient is subjectively concerned  about colon polyp / mass lesion causing her symptoms.   I discussed the differential for her in regards to bowel habit changes. While this could simply reflect worsening of chronic constipation, I offered her a colonoscopy to ensure no polyp / mass lesion causing her symptoms. In the interim until this is done, she can continue miralax and titrate as needed. I would stop benefiber given her bloating / gas, suspect this may be related. She appears stable from cardiopulmonary standpoint for colonoscopy.   The indications, risks, and benefits of colonoscopy were explained to the patient in detail. Risks include but are not limited to bleeding, perforation, adverse reaction to medications, and cardiopulmonary compromise. Sequelae include but are not limited to the possibility of surgery, hospitalization, and mortality. The patient verbalized understanding and wished to proceed. All questions answered, referred to the scheduler and bowel prep ordered. Further recommendations pending results of the exam.   SCarolina Cellar MD [facility]May[facility] CampusG[facility]enter[facility] Pager [phone]

## 2015-09-30 NOTE — Patient Instructions (Signed)

## 2015-09-30 NOTE — Telephone Encounter (Signed)
Patient has a Moviprep sample at home that is not expired.  She would like to use that instead of purchasing a different prep since she already spent money on the Moviprep.  I scheduled a previsit for her for 10/07/2015 at 10:30am.  I will print movi instructions and make sure the previsit nurse has them ahead of time.  Patient acknowledged and understood.

## 2015-10-07 ENCOUNTER — Ambulatory Visit (AMBULATORY_SURGERY_CENTER): Payer: Self-pay

## 2015-10-07 VITALS — Ht 65.0 in | Wt 168.0 lb

## 2015-10-07 DIAGNOSIS — R194 Change in bowel habit: Secondary | ICD-10-CM

## 2015-10-07 NOTE — Progress Notes (Signed)
No egg or soy allergies Not on home 02 Hx of post op N/V, no other anesthesia complications No diet or weight loss meds Pt has Moviprep from colon previously scheduled.

## 2015-10-12 ENCOUNTER — Encounter: Payer: Medicare Other | Admitting: Gastroenterology

## 2015-10-13 ENCOUNTER — Encounter: Payer: Medicare Other | Admitting: Internal Medicine

## 2015-10-14 ENCOUNTER — Ambulatory Visit (AMBULATORY_SURGERY_CENTER): Payer: Medicare Other | Admitting: Gastroenterology

## 2015-10-14 ENCOUNTER — Encounter: Payer: Self-pay | Admitting: Gastroenterology

## 2015-10-14 VITALS — BP 125/50 | HR 49 | Temp 97.1°F | Resp 16 | Ht 65.0 in | Wt 168.0 lb

## 2015-10-14 DIAGNOSIS — D123 Benign neoplasm of transverse colon: Secondary | ICD-10-CM | POA: Diagnosis not present

## 2015-10-14 DIAGNOSIS — R194 Change in bowel habit: Secondary | ICD-10-CM

## 2015-10-14 DIAGNOSIS — D127 Benign neoplasm of rectosigmoid junction: Secondary | ICD-10-CM

## 2015-10-14 LAB — GLUCOSE, CAPILLARY
Glucose-Capillary: 97 mg/dL (ref 65–99)
Glucose-Capillary: 94 mg/dL (ref 65–99)

## 2015-10-14 MED ORDER — SODIUM CHLORIDE 0.9 % IV SOLN: 500.0000 mL | INTRAVENOUS | Status: DC

## 2015-10-14 NOTE — Progress Notes (Signed)
Called to room to assist during endoscopic procedure.  Patient ID and intended procedure confirmed with present staff. Received instructions for my participation in the procedure from the performing physician.  

## 2015-10-14 NOTE — Op Note (Signed)
[name] Patient Name: [name] Procedure Date: 10/14/2015 1:52 PM MRN: [id number] Endoscopist: Remo Lipps P. Havery Moros , MD Age: 81 Referring MD:  Date of Birth: 1935/04/12 Gender: Female Procedure:                Colonoscopy Indications:              Change in bowel habits, CRC screening Medicines:                Monitored Anesthesia Care Procedure:                Pre-Anesthesia Assessment:                           - Prior to the procedure, a History and Physical                            was performed, and patient medications and                            allergies were reviewed. The patient's tolerance of                            previous anesthesia was also reviewed. The risks                            and benefits of the procedure and the sedation                            options and risks were discussed with the patient.                            All questions were answered, and informed consent                            was obtained. Prior Anticoagulants: The patient has                            taken aspirin, last dose was 1 day prior to                            procedure. ASA Grade Assessment: III - A patient                            with severe systemic disease. After reviewing the                            risks and benefits, the patient was deemed in                            satisfactory condition to undergo the procedure.                           After obtaining informed consent, the colonoscope  was passed under direct vision. Throughout the                            procedure, the patient's blood pressure, pulse, and                            oxygen saturations were monitored continuously. The                            Model PCF-H190L [id number]) scope was introduced                            through the anus and advanced to the the cecum,                            identified by appendiceal  orifice and ileocecal                            valve. The patient tolerated the procedure well.                            The colonoscopy was technically difficult and                            complex due to significant looping. The quality of                            the bowel preparation was adequate. The ileocecal                            valve, appendiceal orifice, and rectum were                            photographed. Scope In: 1:59:43 PM Scope Out: 2:36:17 PM Scope Withdrawal Time: 0 hours 25 minutes 45 seconds  Total Procedure Duration: 0 hours 36 minutes 34 seconds  Findings:      The perianal and digital rectal examinations were normal.      Many small and large-mouthed diverticula were found in the sigmoid colon.      A few medium-mouthed diverticula were found in the right colon.      Five flat polyps were found in the hepatic flexure. The polyps were 3 to       7 mm in size. These polyps were removed with a cold snare. Resection and       retrieval were complete.      A 3 mm polyp was found in the recto-sigmoid colon. The polyp was       sessile. The polyp was removed with a cold biopsy forceps. Resection and       retrieval were complete.      The colon (entire examined portion) was significantly tortuous with       looping that made for a technically challenging cecal intubation.      The exam was otherwise without abnormality on direct and retroflexion       views. Complications:  No immediate complications. Estimated blood loss:                            Minimal. Estimated Blood Loss:     Estimated blood loss was minimal. Impression:               - Diverticulosis in the sigmoid colon.                           - Diverticulosis in the right colon.                           - Five 5 to 7 mm polyps at the hepatic flexure,                            removed with a cold snare. Resected and retrieved.                           - One 3 mm polyp at the  recto-sigmoid colon,                            removed with a cold biopsy forceps. Resected and                            retrieved.                           - Tortuous colon.                           - The examination was otherwise normal on direct                            and retroflexion views. Recommendation:           - Patient has a contact number available for                            emergencies. The signs and symptoms of potential                            delayed complications were discussed with the                            patient. Return to normal activities tomorrow.                            Written discharge instructions were provided to the                            patient.                           - Resume previous diet.                           -  Continue present medications.                           - No aspirin, ibuprofen, naproxen, or other                            non-steroidal anti-inflammatory drugs for 2 weeks                            after polyp removal.                           - Await pathology results.                           - Repeat colonoscopy is recommended for                            surveillance. The colonoscopy date will be                            determined after pathology results from today's                            exam become available for review. Procedure Code(s):        --- Professional ---                           [phone], Colonoscopy, flexible; with removal of                            tumor(s), polyp(s), or other lesion(s) by snare                            technique                           [id number], 10, Colonoscopy, flexible; with biopsy,                            single or multiple CPT copyright 2016 [employer]. All rights reserved. Remo Lipps P. Havery Moros, MD [name], MD 10/14/2015 2:41:51 PM This report has been signed electronically. Number of Addenda: 0

## 2015-10-14 NOTE — Progress Notes (Signed)
To PACU awake and alert report to RN

## 2015-10-14 NOTE — Patient Instructions (Signed)
YOU HAD AN ENDOSCOPIC PROCEDURE TODAY AT [facility]:   Refer to the procedure report that was given to you for any specific questions about what was found during the examination.  If the procedure report does not answer your questions, please call your gastroenterologist to clarify.  If you requested that your care partner not be given the details of your procedure findings, then the procedure report has been included in a sealed envelope for you to review at your convenience later.  YOU SHOULD EXPECT: Some feelings of bloating in the abdomen. Passage of more gas than usual.  Walking can help get rid of the air that was put into your GI tract during the procedure and reduce the bloating. If you had a lower endoscopy (such as a colonoscopy or flexible sigmoidoscopy) you may notice spotting of blood in your stool or on the toilet paper. If you underwent a bowel prep for your procedure, you may not have a normal bowel movement for a few days.  Please Note:  You might notice some irritation and congestion in your nose or some drainage.  This is from the oxygen used during your procedure.  There is no need for concern and it should clear up in a day or so.  SYMPTOMS TO REPORT IMMEDIATELY:   Following lower endoscopy (colonoscopy or flexible sigmoidoscopy):  Excessive amounts of blood in the stool  Significant tenderness or worsening of abdominal pains  Swelling of the abdomen that is new, acute  Fever of 100F or higher   For urgent or emergent issues, a gastroenterologist can be reached at any hour by calling [phone].   DIET: Your first meal following the procedure should be a small meal and then it is ok to progress to your normal diet. Heavy or fried foods are harder to digest and may make you feel nauseous or bloated.  Likewise, meals heavy in dairy and vegetables can increase bloating.  Drink plenty of fluids but you should avoid alcoholic beverages for 24  hours.  ACTIVITY:  You should plan to take it easy for the rest of today and you should NOT DRIVE or use heavy machinery until tomorrow (because of the sedation medicines used during the test).    FOLLOW UP: Our staff will call the number listed on your records the next business day following your procedure to check on you and address any questions or concerns that you may have regarding the information given to you following your procedure. If we do not reach you, we will leave a message.  However, if you are feeling well and you are not experiencing any problems, there is no need to return our call.  We will assume that you have returned to your regular daily activities without incident.  If any biopsies were taken you will be contacted by phone or by letter within the next 1-3 weeks.  Please call us at [phone] if you have not heard about the biopsies in 3 weeks.    SIGNATURES/CONFIDENTIALITY: You and/or your care partner have signed paperwork which will be entered into your electronic medical record.  These signatures attest to the fact that that the information above on your After Visit Summary has been reviewed and is understood.  Full responsibility of the confidentiality of this discharge information lies with you and/or your care-partner.  Polyp handout given Await pathology results Avoid NSAIDS for 2 weeks

## 2015-10-15 ENCOUNTER — Telehealth: Payer: Self-pay | Admitting: *Deleted

## 2015-10-15 NOTE — Telephone Encounter (Signed)
  Follow up Call-  Call back number 10/14/2015  Post procedure Call Back phone  # [phone]  Permission to leave phone message Yes     Patient questions:  Do you have a fever, pain , or abdominal swelling? No. Pain Score  0 *  Have you tolerated food without any problems? Yes.    Have you been able to return to your normal activities? Yes.    Do you have any questions about your discharge instructions: Diet   No. Medications  No. Follow up visit  No.  Do you have questions or concerns about your Care? Yes.    Actions: * If pain score is 4 or above: No action needed, pain <4.

## 2015-10-21 ENCOUNTER — Encounter: Payer: Self-pay | Admitting: Gastroenterology

## 2015-12-16 ENCOUNTER — Encounter: Payer: Self-pay | Admitting: Cardiovascular Disease

## 2015-12-16 ENCOUNTER — Ambulatory Visit (INDEPENDENT_AMBULATORY_CARE_PROVIDER_SITE_OTHER): Payer: Medicare Other | Admitting: Cardiovascular Disease

## 2015-12-16 VITALS — BP 141/66 | HR 55 | Ht 64.0 in | Wt 165.8 lb

## 2015-12-16 DIAGNOSIS — E119 Type 2 diabetes mellitus without complications: Secondary | ICD-10-CM

## 2015-12-16 DIAGNOSIS — I10 Essential (primary) hypertension: Secondary | ICD-10-CM

## 2015-12-16 DIAGNOSIS — E785 Hyperlipidemia, unspecified: Secondary | ICD-10-CM | POA: Diagnosis not present

## 2015-12-16 DIAGNOSIS — I2510 Atherosclerotic heart disease of native coronary artery without angina pectoris: Secondary | ICD-10-CM | POA: Diagnosis not present

## 2015-12-16 DIAGNOSIS — G2581 Restless legs syndrome: Secondary | ICD-10-CM

## 2015-12-16 NOTE — Patient Instructions (Signed)
Your physician has recommended you make the following change in your medication:  The lisinopril has been increased to 1 tablet daily.  Your physician wants you to follow-up in: 6 months or sooner if needed.You will receive a reminder letter in the mail two months in advance. If you don't receive a letter, please call our office to schedule the follow-up appointment.  If you need a refill on your cardiac medications before your next appointment, please call your pharmacy.

## 2015-12-18 ENCOUNTER — Encounter: Payer: Self-pay | Admitting: Cardiovascular Disease

## 2015-12-18 DIAGNOSIS — E119 Type 2 diabetes mellitus without complications: Secondary | ICD-10-CM | POA: Insufficient documentation

## 2015-12-18 NOTE — Progress Notes (Signed)
Patient ID: [name], female   DOB: 1935/07/10, 82 y.o.   MRN: [id number]      HPI: [name] is a 81 y.o. female who presents to the office for an 39-monthcardiology evaluation.  [name] known CAD and in March 2012 underwent stenting of a 90% eccentric RCA stenosis with a 3.0x15 mm integrity bare-metal stent. She developed recurrent chest pain in July 2012 and repeat catheterization revealed widely patent stent. She did have 60% postural diagonal 1 stenosis and medical therapy was recommended. She had mild luminal irregularities of the LAD.  In 2012 an echo Doppler study did show normal systolic function with mild aortic sclerosis mild MR and mild TR. She has a history of hyperlipidemia, restless legs, as well as documented insulin resistance.  She developed a herniated disc in her neck and underwent surgery by Dr. DSherley Boundson November 30 1 2014. She tolerated surgery well from a cardiovascular standpoint.  She has mild nerve damage from an episode of shingles which is improved with Neurontin. She does have restless legs which she takes Sinemet 25/100, one quarter of a tablet 2 times per day. She does have hyperlipidemia. She does have hypertension. She denies presyncope or syncope.    When I last saw her she had noticed development of rare chest pain when she walks fast or walks up a steep incline.  She underwent a nuclear perfusion study in October 2016 which showed normal perfusion and function without scar or ischemia.  Showed a normal hCG response to exercise.  She denies any recent chest pain or shortness of breath.  She is unaware of palpitations.  She has been taking lisinopril only at 1.25 mg daily in addition to metoprolol, tartrate 37.5 twice a day for hypertension.  She has significant restless leg syndrome for which she takes Sinemet.  She has peripheral neuropathy on Neurontin.  She is diabetic on metformin.  She presents for reevaluation  Past  Medical History  Diagnosis Date  . CAD (coronary artery disease)   . Hyperlipemia   . Restless legs   . Insulin resistance   . Hypertension   . Aneurysm (HHope     Right eye a non DES stent was placed so that she would not required long term dual antiplatelet therapy.  .Marland KitchenHx of echocardiogram 11/2010    Ef 67% Normal size chambes, Aortic valve sclerosis without stenosis, No other significant valvular abnormalities, No percardial effusion.  .Marland KitchenHistory of stress test 03/2012    The post stress myocardial perfusion images show a normal pattern of perfusion in all region. The post left ventricles is normal in size. There is no scintigraphic of inductible myocardial ischemia. The post EF is 73  . Shingles   . Multiple thyroid nodules   . Anxiety     not currently taking any meds  . H/O hiatal hernia   . Neuromuscular disorder (HCC)     nerve pain after shingles  . Arthritis   . Osteoporosis   . Diabetes mellitus without complication (HCC)     prediabetes per pt  . Heart murmur     Past Surgical History  Procedure Laterality Date  . Angioplasty      Stenting of a 90% eccentric right coronary artery stenosis and had a 3.0x15 mm Integrity bare-metal stent inserted  . Cardiac catheterization      Showed a widely patent stent, she did have 60% ostial diagonal-1 stenosis, She also had mild luminal irregularities  of her LAD.  Marland Kitchen Abdominal hysterectomy  1970  . Refractive surgery Left 2011  . Colonoscopy    . Eye surgery Bilateral 2009    cateract surgery- bilateral  . Tonsillectomy    . Appendectomy    . Breast surgery Left     cysts removed from left breast (in her 20'2)  . Anterior cervical decomp/discectomy fusion N/A 07/30/2013    Procedure: ANTERIOR CERVICAL DECOMPRESSION/DISCECTOMY FUSION CERVICAL FIVE -SIX;  Surgeon: Eustace Moore, MD;  Location: [facility];  Service: Neurosurgery;  Laterality: N/A;    Allergies  Allergen Reactions  . Codeine Nausea And Vomiting and Other  (See Comments)    Severe constipation.    Current Outpatient Prescriptions  Medication Sig Dispense Refill  . ALPRAZolam (XANAX) 0.5 MG tablet Take 1 tablet by mouth as needed.    Marland Kitchen aspirin 81 MG tablet Take 81 mg by mouth every other day.     . Biotin 5 MG CAPS Take 1 capsule by mouth. Pt states it is 1000 units dose    . carbidopa-levodopa (SINEMET IR) 25-100 MG per tablet Take one-fourth tablet two times daily    . docusate sodium (STOOL SOFTENER) 100 MG capsule Take 100 mg by mouth 2 (two) times daily. Takes 1 in the am and 2 in the evening    . fish oil-omega-3 fatty acids 1000 MG capsule Take 1,200 g by mouth daily.     Marland Kitchen gabapentin (NEURONTIN) 300 MG capsule Take 300 mg by mouth once.     Marland Kitchen lisinopril (PRINIVIL,ZESTRIL) 2.5 MG tablet Take 2.5 mg by mouth daily.    . Magnesium 250 MG TABS Take 500 mg by mouth at bedtime.    . metFORMIN (GLUCOPHAGE-XR) 500 MG 24 hr tablet Take 1 tablet by mouth daily.    . metoprolol tartrate (LOPRESSOR) 25 MG tablet Take 37.5 mg by mouth daily. Take 1 and 1/2 tablet daily    . Multiple Vitamins-Minerals (CENTRUM SILVER PO) Take 1 tablet by mouth daily.    . Na Sulfate-K Sulfate-Mg Sulf SOLN Take 1 kit by mouth once. 354 mL 0  . nitroGLYCERIN (NITROSTAT) 0.4 MG SL tablet Place 0.4 mg under the tongue every 5 (five) minutes as needed for chest pain.    [name] Glycol-Propyl Glycol (SYSTANE OP) Apply 1 drop to eye daily as needed.    . rosuvastatin (CRESTOR) 40 MG tablet Take 40 mg by mouth daily.    . traZODone (DESYREL) 100 MG tablet Take 100 mg by mouth at bedtime.    . Vitamin D, Ergocalciferol, (DRISDOL) 50000 UNITS CAPS capsule Take 50,000 Units by mouth every 7 (seven) days.     No current facility-administered medications for this visit.    Social History   Social History  . Marital Status: Married    Spouse Name: N/A  . Number of Children: N/A  . Years of Education: N/A   Occupational History  . Not on file.   Social History  Main Topics  . Smoking status: Former Smoker -- 2.00 packs/day for 33 years    Types: Cigarettes  . Smokeless tobacco: Never Used     Comment: quit in 1995.  Marland Kitchen Alcohol Use: 0.0 oz/week    0 Standard drinks or equivalent per week     Comment: occasional (maybe twice a year)  . Drug Use: No  . Sexual Activity: Not on file   Other Topics Concern  . Not on file   Social History Narrative  Family History  Problem Relation Age of Onset  . Alzheimer's disease Mother 66  . Colon polyps Mother   . Diabetes Mother   . Heart attack Father 2  . Cancer Maternal Grandmother 12  . Breast cancer Maternal Grandmother   . Diabetes Sister   . Breast cancer Maternal Uncle   . Breast cancer Maternal Aunt     x 2  . Colon polyps Sister   . Colon polyps Maternal Aunt   . Colon cancer Neg Hx    Socially she is married has one child and 2 grandchildren. There is no tobacco or alcohol use.  ROS General: Negative; No fevers, chills, or night sweats;  HEENT: Negative; No changes in vision or hearing, sinus congestion, difficulty swallowing Pulmonary: Negative; No cough, wheezing, shortness of breath, hemoptysis Cardiovascular: Negative; No chest pain, presyncope, syncope, palpitations GI: GERD; No nausea, vomiting, diarrhea, or abdominal pain GU: Negative; No dysuria, hematuria, or difficulty voiding Musculoskeletal: Negative; no myalgias, joint pain, or weakness Hematologic/Oncology: Negative; no easy bruising, bleeding Endocrine: Negative; no heat/cold intolerance; no diabetes Neuro: Positive for restless legs Skin: Negative; No rashes or skin lesions Psychiatric: Negative; No behavioral problems, depression Sleep: Negative; No snoring, daytime sleepiness, hypersomnolence, bruxism, restless legs, hypnogognic hallucinations, no cataplexy Other comprehensive 14 point system review is negative.  PE BP 141/66 mmHg  Pulse 55  Ht '5\' 4"'  (1.626 m)  Wt 165 lb 12.8 oz (75.206 kg)  BMI 28.45  kg/m2   Repeat blood pressure by me 122/70  Wt Readings from Last 3 Encounters:  12/16/15 165 lb 12.8 oz (75.206 kg)  10/14/15 168 lb (76.204 kg)  10/07/15 168 lb (76.204 kg)   General: Alert, oriented, no distress.  Skin: normal turgor, no rashes HEENT: Normocephalic, atraumatic. Pupils round and reactive; sclera anicteric;no lid lag.  Nose without nasal septal hypertrophy Mouth/Parynx benign; Mallinpatti scale 2 Neck: No JVD, no carotid bruits with normal carotid upstroke. Lungs: clear to ausculatation and percussion; no wheezing or rales Heart: PMI is not displaced. RRR, s1 s2 normal 2/6 systolic murmur in aortic region; no IR. No diastolic murmur. No S4. No rubs thrills or heaves. Abdomen: soft, nontender; no hepatosplenomehaly, BS+; abdominal aorta nontender and not dilated by palpation. Pulses 2+ Extremities: no clubbing cyanosis or edema, Homan's sign negative  Neurologic: grossly nonfocal Psychologic: normal affect and mood  ECG (independently read by me): Sinus bradycardia 55 beats per minute with first-degree AV block, PR interval 238 ms.  No ST segment changes.  September 2016 ECG (independently read by me): Sinus bradycardia at 55 bpm.  Nonspecific ST changes  March 2015 ECG (independently read by me): Sinus bradycardia 55 beats per minute. Nonspecific ST changes appear.  Prior 06/03/2013 ECG: Sinus rhythm with first degree AV block. QRS complex V1 V2. Nonspecific ST changes which are present previously. Borderline LVH by voltage in aVL  LABS: BMP Latest Ref Rng 04/13/2015 07/30/2013 06/13/2013  Glucose 65 - 99 mg/dL 109(H) 117(H) 118(H)  BUN 7 - 25 mg/dL 19 26(H) 21  Creatinine 0.60 - 0.93 mg/dL 0.81 0.77 0.90  Sodium 135 - 146 mmol/L 139 140 136  Potassium 3.5 - 5.3 mmol/L 4.6 4.5 4.7  Chloride 98 - 110 mmol/L 104 104 102  CO2 20 - 31 mmol/L '29 23 26  ' Calcium 8.6 - 10.4 mg/dL 9.5 9.1 9.9   Hepatic Function Latest Ref Rng 04/13/2015 06/13/2013 02/02/2011  Total  Protein 6.1 - 8.1 g/dL 6.5 6.9 6.4  Albumin 3.6 - 5.1 g/dL 4.3  4.5 3.7  AST 10 - 35 U/L '16 23 23  ' ALT 6 - 29 U/L '21 24 3  ' Alk Phosphatase 33 - 130 U/L 37 57 86  Total Bilirubin 0.2 - 1.2 mg/dL 0.5 0.5 0.3   CBC Latest Ref Rng 04/13/2015 07/30/2013 06/13/2013  WBC 4.0 - 10.5 K/uL 6.8 5.0 3.9(L)  Hemoglobin 12.0 - 15.0 g/dL 13.5 13.6 13.2  Hematocrit 36.0 - 46.0 % 40.8 40.7 38.5  Platelets 150 - 400 K/uL 191 194 190   Lab Results  Component Value Date   MCV 91.5 04/13/2015   MCV 92.3 07/30/2013   MCV 87.5 06/13/2013   Lab Results  Component Value Date   TSH 1.142 04/13/2015   Lab Results  Component Value Date   HGBA1C 6.8* 02/02/2011   Lipid Panel     Component Value Date/Time   CHOL 129 04/13/2015 0802   CHOL 141 06/13/2013 0838   TRIG 87 04/13/2015 0802   TRIG 129 06/13/2013 0838   HDL 57 04/13/2015 0802   HDL 53 06/13/2013 0838   CHOLHDL 2.3 04/13/2015 0802   VLDL 17 04/13/2015 0802   LDLCALC 55 04/13/2015 0802   LDLCALC 62 06/13/2013 0838     RADIOLOGY: No results found.    ASSESSMENT AND PLAN: [name] is a 72 years ol female who is status post stenting of her RCA with a bare metal stent in March 2012.. She did have a  60% ostial diagonal stenosis. She has a history of hyperlipidemia.  An echo Doppler study in December 2014  showed an ejection fraction at 55-60% without wall motion abnormalities. There was mild aortic stenosis. Mitral valve was structurally normal.  She had undergone a follow-up nuclear perfusion study due to symptoms of vague chest pain as well as exertional dyspnea.  I reviewed this with her in detail today.  This revealed entirely normal perfusion and function with no ST segment changes during stress.  When I last saw her, I suggested that she titrate her lisinopril but she had never done this.  I again have recommended that she increase this to 2.5 mg daily .  She does have some mild blood pressure lability.  Her ECG remained stable.  She is  mildly bradycardic but asymptomatic.  She's not having ectopy.  Her first-degree AV block is stable.  She is on Crestor for hyperlipidemia and is at target with LDL at 55 when last checked.  She is diabetic on metformin and tolerating this well.  She has significant restless leg syndrome which has improved with carbidopa levodopa combination, but she only takes one fourth of a 25-100 mg tablet.  She denies any symptoms suggestive of sleep apnea.  She will continue current therapy.  I will see her in 6 months for reevaluation or sooner if problems arise.  Time spent: 25 minutes Troy Sine, MD, [facility]  12/18/2015 10:20 AM

## 2016-01-21 ENCOUNTER — Telehealth: Payer: Self-pay | Admitting: *Deleted

## 2016-01-21 NOTE — Telephone Encounter (Signed)
Faxed surgical clearance to [facility] for 03/03/16 eye procedure.

## 2016-02-21 DIAGNOSIS — H04559 Acquired stenosis of unspecified nasolacrimal duct: Secondary | ICD-10-CM | POA: Insufficient documentation

## 2016-06-26 ENCOUNTER — Encounter: Payer: Self-pay | Admitting: *Deleted

## 2016-06-27 ENCOUNTER — Ambulatory Visit (INDEPENDENT_AMBULATORY_CARE_PROVIDER_SITE_OTHER): Payer: Medicare Other | Admitting: Cardiovascular Disease

## 2016-06-27 ENCOUNTER — Encounter: Payer: Self-pay | Admitting: Cardiovascular Disease

## 2016-06-27 VITALS — BP 144/64 | HR 62 | Ht 65.0 in | Wt 164.6 lb

## 2016-06-27 DIAGNOSIS — E785 Hyperlipidemia, unspecified: Secondary | ICD-10-CM

## 2016-06-27 DIAGNOSIS — R011 Cardiac murmur, unspecified: Secondary | ICD-10-CM

## 2016-06-27 DIAGNOSIS — I2510 Atherosclerotic heart disease of native coronary artery without angina pectoris: Secondary | ICD-10-CM | POA: Diagnosis not present

## 2016-06-27 DIAGNOSIS — G2581 Restless legs syndrome: Secondary | ICD-10-CM | POA: Diagnosis not present

## 2016-06-27 DIAGNOSIS — I10 Essential (primary) hypertension: Secondary | ICD-10-CM | POA: Diagnosis not present

## 2016-06-27 MED ORDER — LISINOPRIL 5 MG PO TABS: 5.0000 mg | ORAL_TABLET | Freq: Every day | ORAL | 3 refills | Status: DC

## 2016-06-27 NOTE — Patient Instructions (Signed)
Your physician has recommended you make the following change in your medication:   1.) the lisinopril has been increased to 5 mg daily. Take (2) of your 2.5 mg tablets until completed then start the 5 mg tablets. Prescription has been sent into your pharmacy to "fill later".  Your physician has requested that you have an echocardiogram. Echocardiography is a painless test that uses sound waves to create images of your heart. It provides your doctor with information about the size and shape of your heart and how well your heart's chambers and valves are working. This procedure takes approximately one hour. There are no restrictions for this procedure. This will done at the [facility].  Your physician wants you to follow-up in: 6 months or sooner if needed. You will receive a reminder letter in the mail two months in advance. If you don't receive a letter, please call our office to schedule the follow-up appointment.  If you need a refill on your cardiac medications before your next appointment, please call your pharmacy.

## 2016-06-27 NOTE — Progress Notes (Signed)
Patient ID: [name], female   DOB: 01-25-1935, 81 y.o.   MRN: [id number]      HPI: [name] is a 81 y.o. female who presents to the office for an 6 month cardiology evaluation.  [name] has known CAD and in March 2012 underwent stenting of a 90% eccentric RCA stenosis with a 3.0x15 mm integrity bare-metal stent. She developed recurrent chest pain in July 2012 and repeat catheterization revealed widely patent stent. She did have 60% postural diagonal 1 stenosis and medical therapy was recommended. She had mild luminal irregularities of the LAD.  In 2012 an echo Doppler study did show normal systolic function with mild aortic sclerosis mild MR and mild TR. She has a history of hyperlipidemia, restless legs, as well as documented insulin resistance.  She developed a herniated disc in her neck and underwent surgery by Dr. Sherley Bounds on November 30 1 2014. She tolerated surgery well from a cardiovascular standpoint.  She has mild nerve damage from an episode of shingles which is improved with Neurontin. She does have restless legs which she takes Sinemet 25/100, one quarter of a tablet 2 times per day. She does have hyperlipidemia. She does have hypertension. She denies presyncope or syncope.    When I last saw her in 2016 she had noticed development of rare chest pain when she walks fast or walks up a steep incline.  She underwent a nuclear perfusion study in October 2016 which showed normal perfusion and function without scar or ischemia.    When I last saw her, I further titrated her lisinopril from 1.25 mg to 2.5 mg.  She has continued to take metoprolol tartrate, but apparently hasn't been taking this 37.5 mg once a day.  She is on Crestor 40 mg for hyperlipidemia with target LDL less than 70.  She has significant restless leg syndrome for which she takes Sinemet.  She has peripheral neuropathy on Neurontin.  She is diabetic on metformin.  She underwent eye surgery in  August 2017 involving her lacrimal duct.  She did have significant nosebleed following this.  She presents for reevaluation.  Past Medical History:  Diagnosis Date  . Aneurysm ([facility])    Right eye a non DES stent was placed so that she would not required long term dual antiplatelet therapy.  . Anxiety    not currently taking any meds  . Arthritis   . CAD (coronary artery disease)   . Diabetes mellitus without complication (HCC)    prediabetes per pt  . H/O hiatal hernia   . Heart murmur   . History of stress test 03/2012   The post stress myocardial perfusion images show a normal pattern of perfusion in all region. The post left ventricles is normal in size. There is no scintigraphic of inductible myocardial ischemia. The post EF is 73  . Hx of echocardiogram 11/2010   Ef 67% Normal size chambes, Aortic valve sclerosis without stenosis, No other significant valvular abnormalities, No percardial effusion.  . Hyperlipemia   . Hypertension   . Insulin resistance   . Multiple thyroid nodules   . Neuromuscular disorder (HCC)    nerve pain after shingles  . Osteoporosis   . Restless legs   . Shingles     Past Surgical History:  Procedure Laterality Date  . ABDOMINAL HYSTERECTOMY  1970  . ANGIOPLASTY     Stenting of a 90% eccentric right coronary artery stenosis and had a 3.0x15 mm Integrity bare-metal stent  inserted  . ANTERIOR CERVICAL DECOMP/DISCECTOMY FUSION N/A 07/30/2013   Procedure: ANTERIOR CERVICAL DECOMPRESSION/DISCECTOMY FUSION CERVICAL FIVE -SIX;  Surgeon: Eustace Moore, MD;  Location: [facility];  Service: Neurosurgery;  Laterality: N/A;  . APPENDECTOMY    . BREAST SURGERY Left    cysts removed from left breast (in her 20'2)  . CARDIAC CATHETERIZATION     Showed a widely patent stent, she did have 60% ostial diagonal-1 stenosis, She also had mild luminal irregularities of her LAD.  Marland Kitchen COLONOSCOPY    . EYE SURGERY Bilateral 2009   cateract surgery- bilateral  .  REFRACTIVE SURGERY Left 2011  . TONSILLECTOMY      Allergies  Allergen Reactions  . Codeine Nausea And Vomiting and Other (See Comments)    Severe constipation.    Current Outpatient Prescriptions  Medication Sig Dispense Refill  . ALPRAZolam (XANAX) 0.5 MG tablet Take 1 tablet by mouth as needed.    . Biotin 5 MG CAPS Take 1 capsule by mouth. Pt states it is 1000 units dose    . carbidopa-levodopa (SINEMET IR) 25-100 MG per tablet Take one-fourth tablet two times daily    . gabapentin (NEURONTIN) 300 MG capsule Take 300 mg by mouth once.     . Magnesium 250 MG TABS Take 500 mg by mouth at bedtime.    . metFORMIN (GLUCOPHAGE-XR) 500 MG 24 hr tablet Take 1 tablet by mouth daily.    . metoprolol tartrate (LOPRESSOR) 25 MG tablet Take 37.5 mg by mouth daily. Take 1 and 1/2 tablet daily    . Multiple Vitamins-Minerals (CENTRUM SILVER PO) Take 1 tablet by mouth daily.    . nitroGLYCERIN (NITROSTAT) 0.4 MG SL tablet Place 0.4 mg under the tongue every 5 (five) minutes as needed for chest pain.    . rosuvastatin (CRESTOR) 40 MG tablet Take 40 mg by mouth daily.    . traZODone (DESYREL) 100 MG tablet Take 100 mg by mouth at bedtime.    . Vitamin D, Ergocalciferol, (DRISDOL) 50000 UNITS CAPS capsule Take 50,000 Units by mouth every 7 (seven) days.    Marland Kitchen lisinopril (PRINIVIL,ZESTRIL) 5 MG tablet Take 1 tablet (5 mg total) by mouth daily. 90 tablet 3   No current facility-administered medications for this visit.     Social History   Social History  . Marital status: Married    Spouse name: N/A  . Number of children: N/A  . Years of education: N/A   Occupational History  . Not on file.   Social History Main Topics  . Smoking status: Former Smoker    Packs/day: 2.00    Years: 33.00    Types: Cigarettes  . Smokeless tobacco: Never Used     Comment: quit in 1995.  Marland Kitchen Alcohol use 0.0 oz/week     Comment: occasional (maybe twice a year)  . Drug use: No  . Sexual activity: Not on file     Other Topics Concern  . Not on file   Social History Narrative  . No narrative on file    Family History  Problem Relation Age of Onset  . Alzheimer's disease Mother 94  . Colon polyps Mother   . Diabetes Mother   . Heart attack Father 29  . Cancer Maternal Grandmother 78  . Breast cancer Maternal Grandmother   . Diabetes Sister   . Breast cancer Maternal Uncle   . Breast cancer Maternal Aunt     x 2  . Colon polyps Sister   .  Colon polyps Maternal Aunt   . Colon cancer Neg Hx    Socially she is married has one child and 2 grandchildren. There is no tobacco or alcohol use.  ROS General: Negative; No fevers, chills, or night sweats;  HEENT: Negative; No changes in vision or hearing, sinus congestion, difficulty swallowing Pulmonary: Negative; No cough, wheezing, shortness of breath, hemoptysis Cardiovascular: Negative; No chest pain, presyncope, syncope, palpitations GI: GERD; No nausea, vomiting, diarrhea, or abdominal pain GU: Negative; No dysuria, hematuria, or difficulty voiding Musculoskeletal: Negative; no myalgias, joint pain, or weakness Hematologic/Oncology: Negative; no easy bruising, bleeding Endocrine: Negative; no heat/cold intolerance; no diabetes Neuro: Positive for restless legs Skin: Negative; No rashes or skin lesions Psychiatric: Negative; No behavioral problems, depression Sleep: Negative; No snoring, daytime sleepiness, hypersomnolence, bruxism, restless legs, hypnogognic hallucinations, no cataplexy Other comprehensive 14 point system review is negative.  PE BP (!) 144/64   Pulse 62   Ht '5\' 5"'  (1.651 m)   Wt 164 lb 9.6 oz (74.7 kg)   BMI 27.39 kg/m    Repeat blood pressure by me 150/70  Wt Readings from Last 3 Encounters:  06/27/16 164 lb 9.6 oz (74.7 kg)  12/16/15 165 lb 12.8 oz (75.2 kg)  10/14/15 168 lb (76.2 kg)   General: Alert, oriented, no distress.  Skin: normal turgor, no rashes HEENT: Normocephalic, atraumatic. Pupils round  and reactive; sclera anicteric;no lid lag.  Nose without nasal septal hypertrophy Mouth/Parynx benign; Mallinpatti scale 2 Neck: No JVD, no carotid bruits with normal carotid upstroke. Lungs: clear to ausculatation and percussion; no wheezing or rales Heart: PMI is not displaced. RRR, s1 s2 normal 2/6 systolic murmur in aortic region; no IR. No diastolic murmur. No S4. No rubs thrills or heaves. Abdomen: soft, nontender; no hepatosplenomehaly, BS+; abdominal aorta nontender and not dilated by palpation. Pulses 2+ Extremities: no clubbing cyanosis or edema, Homan's sign negative  Neurologic: grossly nonfocal Psychologic: normal affect and mood  ECG (independently read by me): Normal sinus rhythm at 62 bpm.  No ectopy.  Normal intervals.  May 2017 ECG (independently read by me): Sinus bradycardia 55 beats per minute with first-degree AV block, PR interval 238 ms.  No ST segment changes.  September 2016 ECG (independently read by me): Sinus bradycardia at 55 bpm.  Nonspecific ST changes  March 2015 ECG (independently read by me): Sinus bradycardia 55 beats per minute. Nonspecific ST changes appear.  Prior 06/03/2013 ECG: Sinus rhythm with first degree AV block. QRS complex V1 V2. Nonspecific ST changes which are present previously. Borderline LVH by voltage in aVL  LABS: BMP Latest Ref Rng & Units 04/13/2015 07/30/2013 06/13/2013  Glucose 65 - 99 mg/dL 109(H) 117(H) 118(H)  BUN 7 - 25 mg/dL 19 26(H) 21  Creatinine 0.60 - 0.93 mg/dL 0.81 0.77 0.90  Sodium 135 - 146 mmol/L 139 140 136  Potassium 3.5 - 5.3 mmol/L 4.6 4.5 4.7  Chloride 98 - 110 mmol/L 104 104 102  CO2 20 - 31 mmol/L '29 23 26  ' Calcium 8.6 - 10.4 mg/dL 9.5 9.1 9.9   Hepatic Function Latest Ref Rng & Units 04/13/2015 06/13/2013 02/02/2011  Total Protein 6.1 - 8.1 g/dL 6.5 6.9 6.4  Albumin 3.6 - 5.1 g/dL 4.3 4.5 3.7  AST 10 - 35 U/L '16 23 23  ' ALT 6 - 29 U/L '21 24 3  ' Alk Phosphatase 33 - 130 U/L 37 57 86  Total Bilirubin 0.2 -  1.2 mg/dL 0.5 0.5 0.3   CBC Latest Ref  Rng & Units 04/13/2015 07/30/2013 06/13/2013  WBC 4.0 - 10.5 K/uL 6.8 5.0 3.9(L)  Hemoglobin 12.0 - 15.0 g/dL 13.5 13.6 13.2  Hematocrit 36.0 - 46.0 % 40.8 40.7 38.5  Platelets 150 - 400 K/uL 191 194 190   Lab Results  Component Value Date   MCV 91.5 04/13/2015   MCV 92.3 07/30/2013   MCV 87.5 06/13/2013   Lab Results  Component Value Date   TSH 1.142 04/13/2015   Lab Results  Component Value Date   HGBA1C 6.8 (H) 02/02/2011   Lipid Panel     Component Value Date/Time   CHOL 129 04/13/2015 0802   CHOL 141 06/13/2013 0838   TRIG 87 04/13/2015 0802   TRIG 129 06/13/2013 0838   HDL 57 04/13/2015 0802   HDL 53 06/13/2013 0838   CHOLHDL 2.3 04/13/2015 0802   VLDL 17 04/13/2015 0802   LDLCALC 55 04/13/2015 0802   LDLCALC 62 06/13/2013 0838     RADIOLOGY: No results found.    ASSESSMENT AND PLAN: [name] is a 27 years ol female who is status post stenting of her RCA with a bare metal stent in March 2012 at which time she also was found to have a 60% ostial diagonal stenosis. She has a history of hyperlipidemia.  An echo Doppler study in December 2014 showed an ejection fraction at 55-60% without wall motion abnormalities. There was mild aortic stenosis. Mitral valve was structurally normal.  She had undergone a follow-up nuclear perfusion study due to symptoms of vague chest pain as well as exertional dyspnea.  Her blood pressure today is mildly increased.  I discussed with her the new recommendations set forth at the most recent [facility] meeting.  I have recommended further titration of her lisinopril to 5 mg daily.  Rather than take her metoprolol 37.5 mg in the morning since this is a tartrate preparation I  have suggested that she take 25 mg in the morning and take 12.5 mg at bedtime.  I have recommended a three-year follow-up echo Doppler study to reassess her systolic murmur.  I will see her in 6 months for  reevaluation.  Time spent: 25 minutes Troy Sine, MD, [facility]  06/27/2016 6:06 PM

## 2016-06-30 DIAGNOSIS — F419 Anxiety disorder, unspecified: Secondary | ICD-10-CM | POA: Insufficient documentation

## 2016-06-30 DIAGNOSIS — G4700 Insomnia, unspecified: Secondary | ICD-10-CM | POA: Insufficient documentation

## 2016-07-27 ENCOUNTER — Ambulatory Visit (HOSPITAL_COMMUNITY): Payer: Medicare Other | Attending: Cardiovascular Disease

## 2016-07-27 ENCOUNTER — Other Ambulatory Visit: Payer: Self-pay

## 2016-07-27 DIAGNOSIS — R011 Cardiac murmur, unspecified: Secondary | ICD-10-CM

## 2017-09-03 ENCOUNTER — Encounter: Payer: Self-pay | Admitting: Cardiovascular Disease

## 2017-09-03 ENCOUNTER — Ambulatory Visit: Payer: Medicare Other | Admitting: Cardiovascular Disease

## 2017-09-03 VITALS — BP 119/61 | HR 62 | Resp 16 | Ht 64.5 in | Wt 170.2 lb

## 2017-09-03 DIAGNOSIS — I358 Other nonrheumatic aortic valve disorders: Secondary | ICD-10-CM | POA: Diagnosis not present

## 2017-09-03 DIAGNOSIS — E785 Hyperlipidemia, unspecified: Secondary | ICD-10-CM | POA: Diagnosis not present

## 2017-09-03 DIAGNOSIS — I440 Atrioventricular block, first degree: Secondary | ICD-10-CM

## 2017-09-03 DIAGNOSIS — G2581 Restless legs syndrome: Secondary | ICD-10-CM

## 2017-09-03 DIAGNOSIS — I2510 Atherosclerotic heart disease of native coronary artery without angina pectoris: Secondary | ICD-10-CM

## 2017-09-03 DIAGNOSIS — R7303 Prediabetes: Secondary | ICD-10-CM | POA: Diagnosis not present

## 2017-09-03 DIAGNOSIS — I10 Essential (primary) hypertension: Secondary | ICD-10-CM | POA: Diagnosis not present

## 2017-09-03 NOTE — Progress Notes (Addendum)
Patient ID: [name], female   DOB: 11-25-1934, 83 y.o.   MRN: [id number]      HPI: [name] is a 83 y.o. female who presents to the office for a 14 month cardiology evaluation.  [name] has known CAD and in March 2012 underwent stenting of a 90% eccentric RCA stenosis with a 3.0x15 mm integrity bare-metal stent. She developed recurrent chest pain in July 2012 and repeat catheterization revealed widely patent stent. She did have 60% postural diagonal 1 stenosis and medical therapy was recommended. She had mild luminal irregularities of the LAD.  In 2012 an echo Doppler study did show normal systolic function with mild aortic sclerosis mild MR and mild TR. She has a history of hyperlipidemia, restless legs, as well as documented insulin resistance.  She developed a herniated disc in her neck and underwent surgery by Dr. Sherley Bounds on November 30 1 2014. She tolerated surgery well from a cardiovascular standpoint.  She has mild nerve damage from an episode of shingles which is improved with Neurontin. She does have restless legs which she takes Sinemet 25/100, one quarter of a tablet 2 times per day. She does have hyperlipidemia. She does have hypertension. She denies presyncope or syncope.    When I  saw her in 2016 she had noticed development of rare chest pain when she walks fast or walks up a steep incline.  She underwent a nuclear perfusion study in October 2016 which showed normal perfusion and function without scar or ischemia.    She underwent eye surgery in August 2017 involving her lacrimal duct.  She did have significant nosebleed following this.  Since I last saw her in November 2017, she underwent a follow-up echo Doppler study in December to set thousand 17.  This showed normal ejection fraction at 55-60% with grade 1 diastolic dysfunction.  There was aortic valve sclerosis without stenosis.  She tells me that her metformin was discontinued this past year  by Dr. [name].  Laboratory had shown a hemoglobin A1c of 6.1.  In May 2018 LDL cholesterol was 63 on rosuvastatin.  She has continued to be on lisinopril 5 mg daily, metoprolol 37.5 mg twice a day for hypertension.  She has peripheral neuropathy on gabapentin 300 mg daily.  She has restless legs, and also takes carbidopa levodopa 25/100 mg one quarter of a tablet twice a day with improvement in symptomatology.  She denies any recurrent anginal symptoms.  There is mild shortness of breath with activity.  She is active.  She presents for a 14 month follow-up evaluation.    Past Medical History:  Diagnosis Date  . Aneurysm ([place])    Right eye a non DES stent was placed so that she would not required long term dual antiplatelet therapy.  . Anxiety    not currently taking any meds  . Arthritis   . CAD (coronary artery disease)   . Diabetes mellitus without complication (HCC)    prediabetes per pt  . H/O hiatal hernia   . Heart murmur   . History of stress test 03/2012   The post stress myocardial perfusion images show a normal pattern of perfusion in all region. The post left ventricles is normal in size. There is no scintigraphic of inductible myocardial ischemia. The post EF is 73  . Hx of echocardiogram 11/2010   Ef 67% Normal size chambes, Aortic valve sclerosis without stenosis, No other significant valvular abnormalities, No percardial effusion.  . Hyperlipemia   .  Hypertension   . Insulin resistance   . Multiple thyroid nodules   . Neuromuscular disorder (HCC)    nerve pain after shingles  . Osteoporosis   . Restless legs   . Shingles     Past Surgical History:  Procedure Laterality Date  . ABDOMINAL HYSTERECTOMY  1970  . ANGIOPLASTY     Stenting of a 90% eccentric right coronary artery stenosis and had a 3.0x15 mm Integrity bare-metal stent inserted  . ANTERIOR CERVICAL DECOMP/DISCECTOMY FUSION N/A 07/30/2013   Procedure: ANTERIOR CERVICAL DECOMPRESSION/DISCECTOMY FUSION  CERVICAL FIVE -SIX;  Surgeon: Eustace Moore, MD;  Location: [facility];  Service: Neurosurgery;  Laterality: N/A;  . APPENDECTOMY    . BREAST SURGERY Left    cysts removed from left breast (in her 20'2)  . CARDIAC CATHETERIZATION     Showed a widely patent stent, she did have 60% ostial diagonal-1 stenosis, She also had mild luminal irregularities of her LAD.  Marland Kitchen COLONOSCOPY    . EYE SURGERY Bilateral 2009   cateract surgery- bilateral  . REFRACTIVE SURGERY Left 2011  . TONSILLECTOMY      Allergies  Allergen Reactions  . Codeine Nausea And Vomiting and Other (See Comments)    Severe constipation.    Current Outpatient Medications  Medication Sig Dispense Refill  . ALPRAZolam (XANAX) 0.5 MG tablet Take 1 tablet by mouth as needed.    . Biotin 5 MG CAPS Take 1 capsule by mouth. Pt states it is 1000 units dose    . carbidopa-levodopa (SINEMET IR) 25-100 MG per tablet Take one-fourth tablet two times daily    . gabapentin (NEURONTIN) 300 MG capsule Take 300 mg by mouth once.     . Magnesium 250 MG TABS Take 500 mg by mouth at bedtime.    . metoprolol tartrate (LOPRESSOR) 25 MG tablet Take 37.5 mg by mouth daily. Take 1 and 1/2 tablet daily    . Multiple Vitamins-Minerals (CENTRUM SILVER PO) Take 1 tablet by mouth daily.    . nitroGLYCERIN (NITROSTAT) 0.4 MG SL tablet Place 0.4 mg under the tongue every 5 (five) minutes as needed for chest pain.    . rosuvastatin (CRESTOR) 40 MG tablet Take 40 mg by mouth daily.    . traZODone (DESYREL) 100 MG tablet Take 100 mg by mouth at bedtime.    . Vitamin D, Ergocalciferol, (DRISDOL) 50000 UNITS CAPS capsule Take 50,000 Units by mouth every 7 (seven) days.    Marland Kitchen lisinopril (PRINIVIL,ZESTRIL) 5 MG tablet Take 1 tablet (5 mg total) by mouth daily. 90 tablet 3  . metFORMIN (GLUCOPHAGE-XR) 500 MG 24 hr tablet Take 1 tablet by mouth daily.     No current facility-administered medications for this visit.     Social History   Socioeconomic History    . Marital status: Married    Spouse name: Not on file  . Number of children: Not on file  . Years of education: Not on file  . Highest education level: Not on file  Social Needs  . Financial resource strain: Not on file  . Food insecurity - worry: Not on file  . Food insecurity - inability: Not on file  . Transportation needs - medical: Not on file  . Transportation needs - non-medical: Not on file  Occupational History  . Not on file  Tobacco Use  . Smoking status: Former Smoker    Packs/day: 2.00    Years: 33.00    Pack years: 66.00  Types: Cigarettes  . Smokeless tobacco: Never Used  . Tobacco comment: quit in 1995.  Substance and Sexual Activity  . Alcohol use: Yes    Alcohol/week: 0.0 oz    Comment: occasional (maybe twice a year)  . Drug use: No  . Sexual activity: Not on file  Other Topics Concern  . Not on file  Social History Narrative  . Not on file    Family History  Problem Relation Age of Onset  . Alzheimer's disease Mother 84  . Colon polyps Mother   . Diabetes Mother   . Heart attack Father 23  . Cancer Maternal Grandmother 71  . Breast cancer Maternal Grandmother   . Diabetes Sister   . Breast cancer Maternal Uncle   . Breast cancer Maternal Aunt        x 2  . Colon polyps Sister   . Colon polyps Maternal Aunt   . Colon cancer Neg Hx    Socially she is married has one child and 2 grandchildren. There is no tobacco or alcohol use.  ROS General: Negative; No fevers, chills, or night sweats;  HEENT: Negative; No changes in vision or hearing, sinus congestion, difficulty swallowing Pulmonary: Negative; No cough, wheezing, shortness of breath, hemoptysis Cardiovascular: Negative; No chest pain, presyncope, syncope, palpitations GI: GERD; No nausea, vomiting, diarrhea, or abdominal pain GU: Negative; No dysuria, hematuria, or difficulty voiding Musculoskeletal: Negative; no myalgias, joint pain, or weakness Hematologic/Oncology: Negative; no  easy bruising, bleeding Endocrine: Negative; no heat/cold intolerance; no diabetes Neuro: Positive for restless legs Skin: Negative; No rashes or skin lesions Psychiatric: Negative; No behavioral problems, depression Sleep: Negative; No snoring, daytime sleepiness, hypersomnolence, bruxism, restless legs, hypnogognic hallucinations, no cataplexy Other comprehensive 14 point system review is negative   PE. BP 119/61   Pulse 62   Resp 16   Ht 5' 4.5" (1.638 m)   Wt 170 lb 3.2 oz (77.2 kg)   SpO2 97%   BMI 28.76 kg/m   Repeat blood pressure by me was 120/68  Wt Readings from Last 3 Encounters:  09/03/17 170 lb 3.2 oz (77.2 kg)  06/27/16 164 lb 9.6 oz (74.7 kg)  12/16/15 165 lb 12.8 oz (75.2 kg)   General: Alert, oriented, no distress.  Skin: normal turgor, no rashes, warm and dry HEENT: Normocephalic, atraumatic. Pupils equal round and reactive to light; sclera anicteric; extraocular muscles intact;  Nose without nasal septal hypertrophy Mouth/Parynx benign; Mallinpatti scale 3 Neck: No JVD, no carotid bruits; normal carotid upstroke Lungs: clear to ausculatation and percussion; no wheezing or rales Chest wall: without tenderness to palpitation Heart: PMI not displaced, RRR, s1 s2 normal, 2/6 systolic murmur in the aortic area concordant with her aortic valve sclerosis, no diastolic murmur, no rubs, gallops, thrills, or heaves Abdomen: soft, nontender; no hepatosplenomehaly, BS+; abdominal aorta nontender and not dilated by palpation. Back: no CVA tenderness Pulses 2+ Musculoskeletal: full range of motion, normal strength, no joint deformities Extremities: no clubbing cyanosis or edema, Homan's sign negative  Neurologic: grossly nonfocal; Cranial nerves grossly wnl Psychologic: Normal mood and affect  ECG (independently read by me): Sinus bradycardia at 59 bpm.  First degree AV block with a PR interval of 2:30 milliseconds.  No syncope ST-T changes.  November 2017 ECG  (independently read by me): Normal sinus rhythm at 62 bpm.  No ectopy.  Normal intervals.  May 2017 ECG (independently read by me): Sinus bradycardia 55 beats per minute with first-degree AV block, PR interval 238 ms.  No ST segment changes.  September 2016 ECG (independently read by me): Sinus bradycardia at 55 bpm.  Nonspecific ST changes  March 2015 ECG (independently read by me): Sinus bradycardia 55 beats per minute. Nonspecific ST changes appear.  Prior 06/03/2013 ECG: Sinus rhythm with first degree AV block. QRS complex V1 V2. Nonspecific ST changes which are present previously. Borderline LVH by voltage in aVL  LABS: BMP Latest Ref Rng & Units 04/13/2015 07/30/2013 06/13/2013  Glucose 65 - 99 mg/dL 109(H) 117(H) 118(H)  BUN 7 - 25 mg/dL 19 26(H) 21  Creatinine 0.60 - 0.93 mg/dL 0.81 0.77 0.90  Sodium 135 - 146 mmol/L 139 140 136  Potassium 3.5 - 5.3 mmol/L 4.6 4.5 4.7  Chloride 98 - 110 mmol/L 104 104 102  CO2 20 - 31 mmol/L '29 23 26  ' Calcium 8.6 - 10.4 mg/dL 9.5 9.1 9.9   Hepatic Function Latest Ref Rng & Units 04/13/2015 06/13/2013 02/02/2011  Total Protein 6.1 - 8.1 g/dL 6.5 6.9 6.4  Albumin 3.6 - 5.1 g/dL 4.3 4.5 3.7  AST 10 - 35 U/L '16 23 23  ' ALT 6 - 29 U/L '21 24 3  ' Alk Phosphatase 33 - 130 U/L 37 57 86  Total Bilirubin 0.2 - 1.2 mg/dL 0.5 0.5 0.3   CBC Latest Ref Rng & Units 04/13/2015 07/30/2013 06/13/2013  WBC 4.0 - 10.5 K/uL 6.8 5.0 3.9(L)  Hemoglobin 12.0 - 15.0 g/dL 13.5 13.6 13.2  Hematocrit 36.0 - 46.0 % 40.8 40.7 38.5  Platelets 150 - 400 K/uL 191 194 190   Lab Results  Component Value Date   MCV 91.5 04/13/2015   MCV 92.3 07/30/2013   MCV 87.5 06/13/2013   Lab Results  Component Value Date   TSH 1.142 04/13/2015   Lab Results  Component Value Date   HGBA1C 6.8 (H) 02/02/2011   Lipid Panel     Component Value Date/Time   CHOL 129 04/13/2015 0802   CHOL 141 06/13/2013 0838   TRIG 87 04/13/2015 0802   TRIG 129 06/13/2013 0838   HDL 57 04/13/2015  0802   HDL 53 06/13/2013 0838   CHOLHDL 2.3 04/13/2015 0802   VLDL 17 04/13/2015 0802   LDLCALC 55 04/13/2015 0802   LDLCALC 62 06/13/2013 0838     RADIOLOGY: No results found.  IMPRESSION: 1. Coronary artery disease involving native coronary artery of native heart without angina pectoris   2. Essential hypertension   3. Aortic valve sclerosis   4. Hyperlipidemia with target LDL less than 70   5. Prediabetes   6. Restless leg syndrome   7. First degree AV block     ASSESSMENT AND PLAN: [name] is a 71 years ol female who has established CAD and is s/p stenting of her RCA with a bare metal stent in March 2012 at which time she also was found to have a 60% ostial diagonal stenosis. She has a history of hyperlipidemia.  An echo Doppler study in December 2014 showed an ejection fraction at 55-60% without wall motion abnormalities. There was mild aortic stenosis. Mitral valve was structurally normal.  Subsequently she had undergone a follow-up nuclear perfusion study due to symptoms of vague chest pain as well as exertional dyspnea which revealed normal perfusion.  Presently, she remains angina free and is now 7 years following her intervention.  Her blood pressure today is stable on a regimen consisting of metoprolol and lisinopril.  Her ECG shows mild sinus bradycardia 59 bpm with first-degree AV block.  I have recommended she continue her current dose of metoprolol 37.5 mg twice a day.  She has prediabetes and had been on metformin.  She is no longer taking this.  Her last hemoglobin A1c was 6.1.  She continues to be on rosuvastatin for hyperlipidemia, and most recent lipid panel in May 2017 was excellent with an LDL cholesterol at 63.  Her restless legs is controlled with Sinemet.  She also has peripheral neuropathy and is also on Neurontin.  I reviewed her most recent echo Doppler study which shows normal systolic function with mild grade 1 diastolic dysfunction.  There is aortic valve  sclerosis which accounts for her to over 6 murmur in her aortic position.Marland Kitchen  Her weight is stable with a BMI of 28.76.  I have encouraged increased exercise with at least 150 minutes per week or 30 minutes a day for at least 5 days weekly.  She will be seeing her primary physician in May.  As long she remained stable, I will see her one year for reevaluation.  Time spent: 25 minutes Troy Sine, MD, [facility]  09/03/2017 8:44 AM

## 2017-09-03 NOTE — Patient Instructions (Signed)

## 2018-03-07 ENCOUNTER — Ambulatory Visit: Payer: Medicare Other | Admitting: Gastroenterology

## 2018-03-07 ENCOUNTER — Telehealth: Payer: Self-pay

## 2018-03-07 VITALS — BP 128/70 | HR 52 | Ht 64.0 in | Wt 170.1 lb

## 2018-03-07 DIAGNOSIS — Z86010 Personal history of colonic polyps: Secondary | ICD-10-CM | POA: Diagnosis not present

## 2018-03-07 DIAGNOSIS — R195 Other fecal abnormalities: Secondary | ICD-10-CM | POA: Diagnosis not present

## 2018-03-07 MED ORDER — SUPREP BOWEL PREP KIT 17.5-3.13-1.6 GM/177ML PO SOLN: ORAL | 0 refills | Status: DC

## 2018-03-07 NOTE — Progress Notes (Signed)
HPI :  83 year old female here for a follow-up visit.   I most recently saw her for screening colonoscopy in March 2017. At that point time she had diverticulosis throughout her colon as well as 6 small adenomas. She reports as part of her routine yearly physical she had a stool test that was performed which was positive for occult blood. Records show she had a positive Hemosure test on 01/01/2018. She denies any overt blood in her stool that she can tell. She denies any troubles in her bowels that bother her currently. She has some baseline constipation for which she takes MiraLAX, she does this every day and works well for her. She denies any abdominal pains which bother her. She denies any weight loss. Multiple family members have had colon polyps but she does not have any family history of known colon cancer. She does not take any anticoagulants. She denies any cardiopulmonary or exertional symptoms, otherwise in good health.   Colonoscopy 10/14/2015 - diverticulosis, 6 small polyps, tortous colon, - all TAs Colonoscopy 2007 - 1cm leiomyoma removed from the sigmoid colon   Past Medical History:  Diagnosis Date  . Aneurysm ([place])    Right eye a non DES stent was placed so that she would not required long term dual antiplatelet therapy.  . Anxiety    not currently taking any meds  . Arthritis   . CAD (coronary artery disease)   . Diabetes mellitus without complication (HCC)    prediabetes per pt  . H/O hiatal hernia   . Heart murmur   . History of stress test 03/2012   The post stress myocardial perfusion images show a normal pattern of perfusion in all region. The post left ventricles is normal in size. There is no scintigraphic of inductible myocardial ischemia. The post EF is 73  . Hx of echocardiogram 11/2010   Ef 67% Normal size chambes, Aortic valve sclerosis without stenosis, No other significant valvular abnormalities, No percardial effusion.  . Hyperlipemia   . Hypertension   .  Insulin resistance   . Multiple thyroid nodules   . Neuromuscular disorder (HCC)    nerve pain after shingles  . Osteoporosis   . Restless legs   . Shingles      Past Surgical History:  Procedure Laterality Date  . ABDOMINAL HYSTERECTOMY  1970  . ANGIOPLASTY     Stenting of a 90% eccentric right coronary artery stenosis and had a 3.0x15 mm Integrity bare-metal stent inserted  . ANTERIOR CERVICAL DECOMP/DISCECTOMY FUSION N/A 07/30/2013   Procedure: ANTERIOR CERVICAL DECOMPRESSION/DISCECTOMY FUSION CERVICAL FIVE -SIX;  Surgeon: Eustace Moore, MD;  Location: [facility];  Service: Neurosurgery;  Laterality: N/A;  . APPENDECTOMY    . BREAST SURGERY Left    cysts removed from left breast (in her 20'2)  . CARDIAC CATHETERIZATION     Showed a widely patent stent, she did have 60% ostial diagonal-1 stenosis, She also had mild luminal irregularities of her LAD.  Marland Kitchen COLONOSCOPY    . EYE SURGERY Bilateral 2009   cateract surgery- bilateral  . REFRACTIVE SURGERY Left 2011  . TONSILLECTOMY     Family History  Problem Relation Age of Onset  . Alzheimer's disease Mother 74  . Colon polyps Mother   . Diabetes Mother   . Heart attack Father 13  . Cancer Maternal Grandmother 59  . Breast cancer Maternal Grandmother   . Diabetes Sister   . Breast cancer Maternal Uncle   . Breast  cancer Maternal Aunt        x 2  . Colon polyps Sister   . Colon polyps Maternal Aunt   . Colon cancer Neg Hx    Social History   Tobacco Use  . Smoking status: Former Smoker    Packs/day: 2.00    Years: 33.00    Pack years: 66.00    Types: Cigarettes  . Smokeless tobacco: Never Used  . Tobacco comment: quit in 1995.  Substance Use Topics  . Alcohol use: Yes    Alcohol/week: 0.0 standard drinks    Comment: occasional (maybe twice a year)  . Drug use: No   Current Outpatient Medications  Medication Sig Dispense Refill  . ALPRAZolam (XANAX) 0.5 MG tablet Take 1 tablet by mouth as needed.    . Biotin  5 MG CAPS Take 1 capsule by mouth. Pt states it is 1000 units dose    . carbidopa-levodopa (SINEMET IR) 25-100 MG per tablet Take one-fourth tablet two times daily    . gabapentin (NEURONTIN) 300 MG capsule Take 300 mg by mouth once.     . Magnesium 250 MG TABS Take 500 mg by mouth at bedtime.    . metFORMIN (GLUCOPHAGE-XR) 500 MG 24 hr tablet Take 1 tablet by mouth daily.    . metoprolol tartrate (LOPRESSOR) 25 MG tablet Take 37.5 mg by mouth daily. Take 1 and 1/2 tablet daily    . Multiple Vitamins-Minerals (CENTRUM SILVER PO) Take 1 tablet by mouth daily.    . nitroGLYCERIN (NITROSTAT) 0.4 MG SL tablet Place 0.4 mg under the tongue every 5 (five) minutes as needed for chest pain.    . polyethylene glycol (MIRALAX / GLYCOLAX) packet Take 17 g by mouth daily.    . rosuvastatin (CRESTOR) 40 MG tablet Take 40 mg by mouth daily.    . traZODone (DESYREL) 100 MG tablet Take 100 mg by mouth at bedtime.    . Vitamin D, Ergocalciferol, (DRISDOL) 50000 UNITS CAPS capsule Take 50,000 Units by mouth every 7 (seven) days.    Marland Kitchen lisinopril (PRINIVIL,ZESTRIL) 5 MG tablet Take 1 tablet (5 mg total) by mouth daily. 90 tablet 3   No current facility-administered medications for this visit.    Allergies  Allergen Reactions  . Codeine Nausea And Vomiting and Other (See Comments)    Severe constipation.     Review of Systems: All systems reviewed and negative except where noted in HPI.   Lab Results  Component Value Date   WBC 6.8 04/13/2015   HGB 13.5 04/13/2015   HCT 40.8 04/13/2015   MCV 91.5 04/13/2015   PLT 191 04/13/2015       Physical Exam: BP 128/70   Pulse (!) 52   Ht 5\' 4"  (1.626 m)   Wt 170 lb 2 oz (77.2 kg)   BMI 29.20 kg/m  Constitutional: Pleasant,well-developed, female in no acute distress. HEENT: Normocephalic and atraumatic. Conjunctivae are normal. No scleral icterus. Neck supple.  Cardiovascular: Normal rate, regular rhythm.  Pulmonary/chest: Effort normal and breath  sounds normal. No wheezing, rales or rhonchi. Abdominal: Soft, nondistended, nontender.  There are no masses palpable. No hepatomegaly. Extremities: no edema Lymphadenopathy: No cervical adenopathy noted. Neurological: Alert and oriented to person place and time. Skin: Skin is warm and dry. No rashes noted. Psychiatric: Normal mood and affect. Behavior is normal.   ASSESSMENT AND PLAN: 83 year old female with history as outlined above, here for follow-up visit to discuss the following issues:  Occult positive stools /  history of colon polyps - last colonoscopy in March 2017 remarkable for 6 small adenomas and diverticulosis, no other concerning findings. She is asymptomatic at present, but had a (+) Hemosure test done in June as part of yearly physical. We discussed the implications of this test. Given her relatively recent colonoscopy it is unlikely that she has any significant polyp or mass lesion, and more likely this could be a false positive test. However, in light of this result,  I'm recommending a colonoscopy to further evaluate and ensure her colon is okay and no interval lesions that have developed. We discussed whether or not she would have wanted a surveillance colonoscopy in March for her history of adenomas regardless, given her age. As she is otherwise healthy, following this discussion she did want a surveillance colonoscopy regardless for history of polyps in March. In light of the stool test, we will do it in the upcoming weeks. I discussed the risks and benefits of colonoscopy and anesthesia with her, after this discussion she wanted to proceed. Moving forward I recommend she avoid stool based test for screening given she has a history of polyps and has had colonoscopies in the past. She agreed.  Buckingham Cellar, MD [facility]

## 2018-03-07 NOTE — Patient Instructions (Addendum)
If you are age 81 or older, your body mass index should be between 23-30. Your Body mass index is 29.2 kg/m. If this is out of the aforementioned range listed, please consider follow up with your Primary Care Provider.  If you are age 55 or younger, your body mass index should be between 19-25. Your Body mass index is 29.2 kg/m. If this is out of the aformentioned range listed, please consider follow up with your Primary Care Provider.   You have been scheduled for a colonoscopy. Please follow written instructions given to you at your visit today.  Please pick up your prep supplies at the pharmacy within the next 1-3 days. If you use inhalers (even only as needed), please bring them with you on the day of your procedure. Your physician has requested that you go to www.startemmi.com and enter the access code given to you at your visit today. This web site gives a general overview about your procedure. However, you should still follow specific instructions given to you by our office regarding your preparation for the procedure.  You may have a light breakfast the morning of prep day (the day before the procedure). Sunday, 04-07-18  You may choose from one of the following items: eggs and toast OR chicken noodle soup and crackers.   You should have your breakfast completed between 8:00 and 9:00 am the day before your procedure.    After you have had your light breakfast you should start a clear liquid diet only, NO SOLIDS. No additional solid food is allowed. You may continue to have clear liquid up to 3 hours prior to your procedure.   Thank you for entrusting me with your care and for choosing [facility], Dr. [place] Cellar

## 2018-03-07 NOTE — Telephone Encounter (Signed)
Called pt and verified to take metformin evening before the procedure but do not take it in the morning and then she can continue to take it after she has her procedure and that she can wear nail polish on her toes and was told to wear light nail polish if she wanted to paint her nails but no nail polish for fingers are best.

## 2018-03-25 ENCOUNTER — Encounter: Payer: Self-pay | Admitting: Gastroenterology

## 2018-04-08 ENCOUNTER — Encounter: Payer: Self-pay | Admitting: Gastroenterology

## 2018-04-08 ENCOUNTER — Ambulatory Visit (AMBULATORY_SURGERY_CENTER): Payer: Medicare Other | Admitting: Gastroenterology

## 2018-04-08 VITALS — BP 135/66 | HR 54 | Temp 97.5°F | Resp 9 | Ht 64.0 in | Wt 170.0 lb

## 2018-04-08 DIAGNOSIS — R195 Other fecal abnormalities: Secondary | ICD-10-CM | POA: Diagnosis present

## 2018-04-08 DIAGNOSIS — D124 Benign neoplasm of descending colon: Secondary | ICD-10-CM | POA: Diagnosis not present

## 2018-04-08 DIAGNOSIS — D125 Benign neoplasm of sigmoid colon: Secondary | ICD-10-CM | POA: Diagnosis not present

## 2018-04-08 DIAGNOSIS — D123 Benign neoplasm of transverse colon: Secondary | ICD-10-CM

## 2018-04-08 MED ORDER — SODIUM CHLORIDE 0.9 % IV SOLN: 500.0000 mL | Freq: Once | INTRAVENOUS | Status: DC

## 2018-04-08 NOTE — Op Note (Signed)
[facility] Patient Name: [name] Procedure Date: 04/08/2018 3:16 PM MRN: [id number] Endoscopist: Remo Lipps P. Havery Moros , MD Age: 83 Referring MD:  Date of Birth: November 07, 1934 Gender: Female Account #: [id number] Procedure:                Colonoscopy Indications:              Positive fecal immunochemical test, multiple                            adenomas removed in 2017 Medicines:                Monitored Anesthesia Care Procedure:                Pre-Anesthesia Assessment:                           - Prior to the procedure, a History and Physical                            was performed, and patient medications and                            allergies were reviewed. The patient's tolerance of                            previous anesthesia was also reviewed. The risks                            and benefits of the procedure and the sedation                            options and risks were discussed with the patient.                            All questions were answered, and informed consent                            was obtained. Prior Anticoagulants: The patient has                            taken no previous anticoagulant or antiplatelet                            agents. ASA Grade Assessment: III - A patient with                            severe systemic disease. After reviewing the risks                            and benefits, the patient was deemed in                            satisfactory condition to undergo the procedure.  After obtaining informed consent, the colonoscope                            was passed under direct vision. Throughout the                            procedure, the patient's blood pressure, pulse, and                            oxygen saturations were monitored continuously. The                            Colonoscope was introduced through the anus and                            advanced to the the cecum,  identified by                            appendiceal orifice and ileocecal valve. The                            colonoscopy was technically difficult and complex                            due to a tortuous colon. The patient tolerated the                            procedure well. The quality of the bowel                            preparation was good. The ileocecal valve,                            appendiceal orifice, and rectum were photographed. Scope In: 3:31:44 PM Scope Out: 4:00:21 PM Scope Withdrawal Time: 0 hours 18 minutes 40 seconds  Total Procedure Duration: 0 hours 28 minutes 37 seconds  Findings:                 Hemorrhoids were found on perianal exam.                           Two sessile polyps were found in the transverse                            colon. The polyps were 3 mm in size. These polyps                            were removed with a cold snare. Resection and                            retrieval were complete.                           A 3 mm polyp was found in the descending  colon. The                            polyp was sessile. The polyp was removed with a                            cold snare. Resection and retrieval were complete.                           A 3 mm polyp was found in the sigmoid colon. The                            polyp was sessile. The polyp was removed with a                            cold snare. Resection and retrieval were complete.                           Many medium-mouthed diverticula were found in the                            left colon.                           The colon was tortuous.                           Internal hemorrhoids were found during retroflexion.                           The exam was otherwise without abnormality. Complications:            No immediate complications. Estimated blood loss:                            Minimal. Estimated Blood Loss:     Estimated blood loss was minimal. Impression:                - Hemorrhoids found on perianal exam.                           - Two 3 mm polyps in the transverse colon, removed                            with a cold snare. Resected and retrieved.                           - One 3 mm polyp in the descending colon, removed                            with a cold snare. Resected and retrieved.                           - One 3 mm polyp in the sigmoid colon, removed with  a cold snare. Resected and retrieved.                           - Diverticulosis in the left colon.                           - Tortuous colon which prolonged this procedure.                           - Internal hemorrhoids.                           - The examination was otherwise normal.                           No significant pathology to cause (+) FIT test,                            which could be due to hemorrhoids. No further stool                            testing should be performed in this patient with a                            history of adenomas. Recommendation:           - Patient has a contact number available for                            emergencies. The signs and symptoms of potential                            delayed complications were discussed with the                            patient. Return to normal activities tomorrow.                            Written discharge instructions were provided to the                            patient.                           - Resume previous diet.                           - Continue present medications.                           - Await pathology results.                           - No further surveillance colonoscopy or stool                            testing  given results of this exam and the                            patient's age Carlota Raspberry. Lexii Walsh, MD 04/08/2018 4:06:11 PM This report has been signed electronically.

## 2018-04-08 NOTE — Progress Notes (Signed)
Report to PACU, RN, vss, BBS= Clear.  

## 2018-04-08 NOTE — Progress Notes (Signed)
Called to room to assist during endoscopic procedure.  Patient ID and intended procedure confirmed with present staff. Received instructions for my participation in the procedure from the performing physician.  

## 2018-04-08 NOTE — Progress Notes (Signed)
Pt's states no medical or surgical changes since previsit or office visit. 

## 2018-04-08 NOTE — Patient Instructions (Signed)
YOU HAD AN ENDOSCOPIC PROCEDURE TODAY AT [facility]:   Refer to the procedure report that was given to you for any specific questions about what was found during the examination.  If the procedure report does not answer your questions, please call your gastroenterologist to clarify.  If you requested that your care partner not be given the details of your procedure findings, then the procedure report has been included in a sealed envelope for you to review at your convenience later.  YOU SHOULD EXPECT: Some feelings of bloating in the abdomen. Passage of more gas than usual.  Walking can help get rid of the air that was put into your GI tract during the procedure and reduce the bloating. If you had a lower endoscopy (such as a colonoscopy or flexible sigmoidoscopy) you may notice spotting of blood in your stool or on the toilet paper. If you underwent a bowel prep for your procedure, you may not have a normal bowel movement for a few days.  Please Note:  You might notice some irritation and congestion in your nose or some drainage.  This is from the oxygen used during your procedure.  There is no need for concern and it should clear up in a day or so.  SYMPTOMS TO REPORT IMMEDIATELY:   Following lower endoscopy (colonoscopy or flexible sigmoidoscopy):  Excessive amounts of blood in the stool  Significant tenderness or worsening of abdominal pains  Swelling of the abdomen that is new, acute  Fever of 100F or higher  Please see handouts on polyps, diverticulosis, and hemorrhoids.  For urgent or emergent issues, a gastroenterologist can be reached at any hour by calling [phone].   DIET:  We do recommend a small meal at first, but then you may proceed to your regular diet.  Drink plenty of fluids but you should avoid alcoholic beverages for 24 hours.  ACTIVITY:  You should plan to take it easy for the rest of today and you should NOT DRIVE or use heavy machinery until  tomorrow (because of the sedation medicines used during the test).    FOLLOW UP: Our staff will call the number listed on your records the next business day following your procedure to check on you and address any questions or concerns that you may have regarding the information given to you following your procedure. If we do not reach you, we will leave a message.  However, if you are feeling well and you are not experiencing any problems, there is no need to return our call.  We will assume that you have returned to your regular daily activities without incident.  If any biopsies were taken you will be contacted by phone or by letter within the next 1-3 weeks.  Please call us at [phone] if you have not heard about the biopsies in 3 weeks.    SIGNATURES/CONFIDENTIALITY: You and/or your care partner have signed paperwork which will be entered into your electronic medical record.  These signatures attest to the fact that that the information above on your After Visit Summary has been reviewed and is understood.  Full responsibility of the confidentiality of this discharge information lies with you and/or your care-partner.   Thank you for allowing [place] to provide your healthcare today.

## 2018-04-09 ENCOUNTER — Telehealth: Payer: Self-pay

## 2018-04-09 NOTE — Telephone Encounter (Signed)
  Follow up Call-  Call back number 04/08/2018 10/14/2015  Post procedure Call Back phone  # [phone]  Permission to leave phone message Yes Yes  Some recent data might be hidden     Patient questions:  Do you have a fever, pain , or abdominal swelling? No. Pain Score  0 *  Have you tolerated food without any problems? Yes.    Have you been able to return to your normal activities? Yes.    Do you have any questions about your discharge instructions: Diet   No. Medications  No. Follow up visit  No.  Do you have questions or concerns about your Care? No.  Actions: * If pain score is 4 or above: No action needed, pain <4.

## 2018-04-09 NOTE — Telephone Encounter (Signed)
  Follow up Call-  Call back number 04/08/2018 10/14/2015  Post procedure Call Back phone  # [phone]  Permission to leave phone message Yes Yes  Some recent data might be hidden     Patient questions:  Do you have a fever, pain , or abdominal swelling? No. Pain Score  0 *  Have you tolerated food without any problems? Yes.    Have you been able to return to your normal activities? Yes.    Do you have any questions about your discharge instructions: Diet   No. Medications  No. Follow up visit  No.  Do you have questions or concerns about your Care? No.  Actions: * If pain score is 4 or above: No action needed, pain <4.

## 2018-04-18 DIAGNOSIS — M25551 Pain in right hip: Secondary | ICD-10-CM | POA: Insufficient documentation

## 2018-08-08 ENCOUNTER — Other Ambulatory Visit: Payer: Self-pay | Admitting: Student

## 2018-08-08 DIAGNOSIS — M25551 Pain in right hip: Secondary | ICD-10-CM

## 2018-08-19 ENCOUNTER — Ambulatory Visit
Admission: RE | Admit: 2018-08-19 | Discharge: 2018-08-19 | Disposition: A | Discharge status: Home / Self Care | Payer: Medicare Other | Source: Ambulatory Visit | Attending: Student | Admitting: Student

## 2018-08-19 DIAGNOSIS — M25551 Pain in right hip: Secondary | ICD-10-CM

## 2018-09-12 ENCOUNTER — Encounter: Payer: Self-pay | Admitting: Cardiovascular Disease

## 2018-09-12 ENCOUNTER — Ambulatory Visit: Payer: Medicare Other | Admitting: Cardiovascular Disease

## 2018-09-12 VITALS — BP 138/78 | HR 59 | Ht 64.0 in | Wt 172.0 lb

## 2018-09-12 DIAGNOSIS — I10 Essential (primary) hypertension: Secondary | ICD-10-CM

## 2018-09-12 DIAGNOSIS — E785 Hyperlipidemia, unspecified: Secondary | ICD-10-CM

## 2018-09-12 DIAGNOSIS — I5189 Other ill-defined heart diseases: Secondary | ICD-10-CM

## 2018-09-12 DIAGNOSIS — I519 Heart disease, unspecified: Secondary | ICD-10-CM

## 2018-09-12 DIAGNOSIS — I440 Atrioventricular block, first degree: Secondary | ICD-10-CM

## 2018-09-12 DIAGNOSIS — I2510 Atherosclerotic heart disease of native coronary artery without angina pectoris: Secondary | ICD-10-CM | POA: Diagnosis not present

## 2018-09-12 DIAGNOSIS — I358 Other nonrheumatic aortic valve disorders: Secondary | ICD-10-CM

## 2018-09-12 DIAGNOSIS — R7303 Prediabetes: Secondary | ICD-10-CM

## 2018-09-12 MED ORDER — LISINOPRIL 10 MG PO TABS: 10.0000 mg | ORAL_TABLET | Freq: Every day | ORAL | 3 refills | Status: DC

## 2018-09-12 NOTE — Progress Notes (Signed)
Patient ID: [name], female   DOB: 08-Feb-1935, 84 y.o.   MRN: [id number]      HPI: [name] is a 84 y.o. female who presents to the office for a 14 month cardiology evaluation.  [name] has known CAD and in March 2012 underwent stenting of a 90% eccentric RCA stenosis with a 3.0x15 mm integrity bare-metal stent. She developed recurrent chest pain in July 2012 and repeat catheterization revealed widely patent stent. She did have 60% postural diagonal 1 stenosis and medical therapy was recommended. She had mild luminal irregularities of the LAD.  In 2012 an echo Doppler study did show normal systolic function with mild aortic sclerosis mild MR and mild TR. She has a history of hyperlipidemia, restless legs, as well as documented insulin resistance.  She developed a herniated disc in her neck and underwent surgery by Dr. Sherley Bounds on November 30 1 2014. She tolerated surgery well from a cardiovascular standpoint.  She has mild nerve damage from an episode of shingles which is improved with Neurontin. She does have restless legs which she takes Sinemet 25/100, one quarter of a tablet 2 times per day. She does have hyperlipidemia. She does have hypertension. She denies presyncope or syncope.    When I  saw her in 2016 she had noticed development of rare chest pain when she walks fast or walks up a steep incline.  She underwent a nuclear perfusion study in October 2016 which showed normal perfusion and function without scar or ischemia.    She underwent eye surgery in  August 2017 involving her lacrimal duct.  She did have significant nosebleed following this.  She underwent a follow-up echo Doppler study in December 2017 which showed normal ejection fraction at 55-60% with grade 1 diastolic dysfunction.  There was aortic valve sclerosis without stenosis.  She tells me that her metformin was discontinued this past year by Dr. [name].  Laboratory had shown a hemoglobin  A1c of 6.1.  In May 2018 LDL cholesterol was 63 on rosuvastatin.  I last saw her in February 2019.  At that time her blood pressure was stable she has continued to be on lisinopril 5 mg daily, metoprolol 37.5 mg twice a day for hypertension.  She has peripheral neuropathy on gabapentin 300 mg daily.  She has restless legs, and also takes carbidopa levodopa 25/100 mg one quarter of a tablet twice a day with improvement in symptomatology.  She denied any recurrent anginal symptoms.  There is mild shortness of breath with activity.  She is active.  I encouraged at least 150 minutes/week of exercise.  Over the past year, she has felt fairly well from a cardiac standpoint.  However she continues to experience some mild shortness of breath with activity which has not significantly changed.  She typically walks her dog approximately 20 minutes/day.  Is been documented to have diastolic dysfunction on echocardiography.  Midst to being under increased stress with her husband's illness who in addition to his bronchiectasis also has developed leukemia.  She denies chest pain.  She denies PND orthopnea.  She presents for yearly evaluation.  Past Medical History:  Diagnosis Date  . Aneurysm ([place])    Right eye a non DES stent was placed so that she would not required long term dual antiplatelet therapy.  . Anxiety    not currently taking any meds  . Arthritis   . CAD (coronary artery disease)   . Diabetes mellitus without complication ([place])  prediabetes per pt  . H/O hiatal hernia   . Heart murmur   . History of stress test 03/2012   The post stress myocardial perfusion images show a normal pattern of perfusion in all region. The post left ventricles is normal in size. There is no scintigraphic of inductible myocardial ischemia. The post EF is 73  . Hx of echocardiogram 11/2010   Ef 67% Normal size chambes, Aortic valve sclerosis without stenosis, No other significant valvular abnormalities, No percardial  effusion.  . Hyperlipemia   . Hypertension   . Insulin resistance   . Multiple thyroid nodules   . Neuromuscular disorder ([facility])    nerve pain after shingles  . Osteoporosis   . Restless legs   . Shingles     Past Surgical History:  Procedure Laterality Date  . ABDOMINAL HYSTERECTOMY  1970  . ANGIOPLASTY     Stenting of a 90% eccentric right coronary artery stenosis and had a 3.0x15 mm Integrity bare-metal stent inserted  . ANTERIOR CERVICAL DECOMP/DISCECTOMY FUSION N/A 07/30/2013   Procedure: ANTERIOR CERVICAL DECOMPRESSION/DISCECTOMY FUSION CERVICAL FIVE -SIX;  Surgeon: Eustace Moore, MD;  Location: [facility];  Service: Neurosurgery;  Laterality: N/A;  . APPENDECTOMY    . BREAST SURGERY Left    cysts removed from left breast (in her 20'2)  . CARDIAC CATHETERIZATION     Showed a widely patent stent, she did have 60% ostial diagonal-1 stenosis, She also had mild luminal irregularities of her LAD.  Marland Kitchen COLONOSCOPY    . EYE SURGERY Bilateral 2009   cateract surgery- bilateral  . REFRACTIVE SURGERY Left 2011  . TONSILLECTOMY      Allergies  Allergen Reactions  . Codeine Nausea And Vomiting and Other (See Comments)    Severe constipation.    Current Outpatient Medications  Medication Sig Dispense Refill  . ALPRAZolam (XANAX) 0.5 MG tablet Take 1 tablet by mouth as needed.    . Biotin 5 MG CAPS Take 1 capsule by mouth. Pt states it is 1000 units dose    . carbidopa-levodopa (SINEMET IR) 25-100 MG per tablet Take one-fourth tablet two times daily    . gabapentin (NEURONTIN) 300 MG capsule Take 300 mg by mouth once.     . Magnesium 250 MG TABS Take 500 mg by mouth at bedtime.    . metFORMIN (GLUCOPHAGE-XR) 500 MG 24 hr tablet Take 1 tablet by mouth daily.    . metoprolol tartrate (LOPRESSOR) 25 MG tablet Take 37.5 mg by mouth daily. Take 1 and 1/2 tablet daily    . Multiple Vitamins-Minerals (CENTRUM SILVER PO) Take 1 tablet by mouth daily.    . nitroGLYCERIN (NITROSTAT) 0.4  MG SL tablet Place 0.4 mg under the tongue every 5 (five) minutes as needed for chest pain.    . polyethylene glycol (MIRALAX / GLYCOLAX) packet Take 17 g by mouth daily.    . rosuvastatin (CRESTOR) 40 MG tablet Take 40 mg by mouth daily.    . traZODone (DESYREL) 100 MG tablet Take 100 mg by mouth at bedtime.    . Vitamin D, Ergocalciferol, (DRISDOL) 50000 UNITS CAPS capsule Take 50,000 Units by mouth every 7 (seven) days.    Marland Kitchen lisinopril (PRINIVIL,ZESTRIL) 10 MG tablet Take 1 tablet (10 mg total) by mouth daily. 90 tablet 3   No current facility-administered medications for this visit.     Social History   Socioeconomic History  . Marital status: Married    Spouse name: Not on file  .  Number of children: Not on file  . Years of education: Not on file  . Highest education level: Not on file  Occupational History  . Not on file  Social Needs  . Financial resource strain: Not on file  . Food insecurity:    Worry: Not on file    Inability: Not on file  . Transportation needs:    Medical: Not on file    Non-medical: Not on file  Tobacco Use  . Smoking status: Former Smoker    Packs/day: 2.00    Years: 33.00    Pack years: 66.00    Types: Cigarettes  . Smokeless tobacco: Never Used  . Tobacco comment: quit in 1995.  Substance and Sexual Activity  . Alcohol use: Yes    Alcohol/week: 0.0 standard drinks    Comment: occasional (maybe twice a year)  . Drug use: No  . Sexual activity: Not on file  Lifestyle  . Physical activity:    Days per week: Not on file    Minutes per session: Not on file  . Stress: Not on file  Relationships  . Social connections:    Talks on phone: Not on file    Gets together: Not on file    Attends religious service: Not on file    Active member of club or organization: Not on file    Attends meetings of clubs or organizations: Not on file    Relationship status: Not on file  . Intimate partner violence:    Fear of current or ex partner: Not on  file    Emotionally abused: Not on file    Physically abused: Not on file    Forced sexual activity: Not on file  Other Topics Concern  . Not on file  Social History Narrative  . Not on file    Family History  Problem Relation Age of Onset  . Alzheimer's disease Mother 90  . Colon polyps Mother   . Diabetes Mother   . Heart attack Father 45  . Cancer Maternal Grandmother 83  . Breast cancer Maternal Grandmother   . Diabetes Sister   . Breast cancer Maternal Uncle   . Breast cancer Maternal Aunt        x 2  . Colon polyps Sister   . Colon polyps Maternal Aunt   . Colon cancer Neg Hx   . Esophageal cancer Neg Hx   . Rectal cancer Neg Hx   . Stomach cancer Neg Hx    Socially she is married has one child and 2 grandchildren. There is no tobacco or alcohol use.  ROS General: Negative; No fevers, chills, or night sweats;  HEENT: Negative; No changes in vision or hearing, sinus congestion, difficulty swallowing Pulmonary: Negative; No cough, wheezing, shortness of breath, hemoptysis Cardiovascular: see HPI GI: GERD; No nausea, vomiting, diarrhea, or abdominal pain GU: Negative; No dysuria, hematuria, or difficulty voiding Musculoskeletal: Negative; no myalgias, joint pain, or weakness Hematologic/Oncology: Negative; no easy bruising, bleeding Endocrine: Negative; no heat/cold intolerance; no diabetes Neuro: Positive for restless legs Skin: Negative; No rashes or skin lesions Psychiatric: Negative; No behavioral problems, depression Sleep: Negative; No snoring, daytime sleepiness, hypersomnolence, bruxism, restless legs, hypnogognic hallucinations, no cataplexy Other comprehensive 14 point system review is negative   PE. BP 138/78   Pulse (!) 59   Ht 5' 4" (1.626 m)   Wt 172 lb (78 kg)   SpO2 95%   BMI 29.52 kg/m   Repeat blood pressure by me   was 138/80  Wt Readings from Last 3 Encounters:  09/12/18 172 lb (78 kg)  04/08/18 170 lb (77.1 kg)  03/07/18 170 lb 2 oz  (77.2 kg)     Physical Exam BP 138/78   Pulse (!) 59   Ht 5' 4" (1.626 m)   Wt 172 lb (78 kg)   SpO2 95%   BMI 29.52 kg/m  General: Alert, oriented, no distress.  Skin: normal turgor, no rashes, warm and dry HEENT: Normocephalic, atraumatic. Pupils equal round and reactive to light; sclera anicteric; extraocular muscles intact; Fundi ** Nose without nasal septal hypertrophy Mouth/Parynx benign; Mallinpatti scale Neck: No JVD, no carotid bruits; normal carotid upstroke Lungs: clear to ausculatation and percussion; no wheezing or rales Chest wall: without tenderness to palpitation Heart: PMI not displaced, RRR, s1 s2 normal, 2/6 systolic murmur according with her aortic sclerosis, no diastolic murmur, no rubs, gallops, thrills, or heaves Abdomen: soft, nontender; no hepatosplenomehaly, BS+; abdominal aorta nontender and not dilated by palpation. Back: no CVA tenderness Pulses 2+ Musculoskeletal: full range of motion, normal strength, no joint deformities Extremities: no clubbing cyanosis or edema, Homan's sign negative  Neurologic: grossly nonfocal; Cranial nerves grossly wnl Psychologic: Normal mood and affect   ECG (independently read by me): Sinus bradycardia 53 bpm with first-degree AV block..  Well to 10 ms.  LVH by voltage  February 2019 ECG (independently read by me): Sinus bradycardia at 59 bpm.  First degree AV block with a PR interval of 2:30 milliseconds.  No syncope ST-T changes.  November 2017 ECG (independently read by me): Normal sinus rhythm at 62 bpm.  No ectopy.  Normal intervals.  May 2017 ECG (independently read by me): Sinus bradycardia 55 beats per minute with first-degree AV block, PR interval 238 ms.  No ST segment changes.  September 2016 ECG (independently read by me): Sinus bradycardia at 55 bpm.  Nonspecific ST changes  March 2015 ECG (independently read by me): Sinus bradycardia 55 beats per minute. Nonspecific ST changes appear.  Prior 06/03/2013  ECG: Sinus rhythm with first degree AV block. QRS complex V1 V2. Nonspecific ST changes which are present previously. Borderline LVH by voltage in aVL  LABS: BMP Latest Ref Rng & Units 04/13/2015 07/30/2013 06/13/2013  Glucose 65 - 99 mg/dL 109(H) 117(H) 118(H)  BUN 7 - 25 mg/dL 19 26(H) 21  Creatinine 0.60 - 0.93 mg/dL 0.81 0.77 0.90  Sodium 135 - 146 mmol/L 139 140 136  Potassium 3.5 - 5.3 mmol/L 4.6 4.5 4.7  Chloride 98 - 110 mmol/L 104 104 102  CO2 20 - 31 mmol/L _0 Calcium 8.6 - 10.4 mg/dL 9.5 9.1 9.9   Hepatic Function Latest Ref Rng & Units 04/13/2015 06/13/2013 02/02/2011  Total Protein 6.1 - 8.1 g/dL 6.5 6.9 6.4  Albumin 3.6 - 5.1 g/dL 4.3 4.5 3.7  AST 10 - 35 U/L _1 ALT 6 - 29 U/L _2 Alk Phosphatase 33 - 130 U/L 37 57 86  Total Bilirubin 0.2 - 1.2 mg/dL 0.5 0.5 0.3   CBC Latest Ref Rng & Units 04/13/2015 07/30/2013 06/13/2013  WBC 4.0 - 10.5 K/uL 6.8 5.0 3.9(L)  Hemoglobin 12.0 - 15.0 g/dL 13.5 13.6 13.2  Hematocrit 36.0 - 46.0 % 40.8 40.7 38.5  Platelets 150 - 400 K/uL 191 194 190   Lab Results  Component Value Date   MCV 91.5 04/13/2015   MCV 92.3 07/30/2013   MCV 87.5 06/13/2013   Lab Results  Component Value Date   TSH 1.142 04/13/2015   Lab Results  Component Value Date   HGBA1C 6.8 (H) 02/02/2011   Lipid Panel     Component Value Date/Time   CHOL 129 04/13/2015 0802   CHOL 141 06/13/2013 0838   TRIG 87 04/13/2015 0802   TRIG 129 06/13/2013 0838   HDL 57 04/13/2015 0802   HDL 53 06/13/2013 0838   CHOLHDL 2.3 04/13/2015 0802   VLDL 17 04/13/2015 0802   LDLCALC 55 04/13/2015 0802   LDLCALC 62 06/13/2013 0838     RADIOLOGY: No results found.  IMPRESSION: 1. Essential hypertension   2. Hyperlipidemia with target LDL less than 70   3. Prediabetes   4. Coronary artery disease involving native coronary artery of native heart without angina pectoris   5. First degree AV block   6. Grade I diastolic dysfunction   7. Aortic valve  sclerosis     ASSESSMENT AND PLAN: Ms. Blissett is a 83 years ol female who has established CAD and is s/p stenting of her RCA with a bare metal stent in March 2012 at which time she also was found to have a 60% ostial diagonal stenosis. She has a history of hyperlipidemia.  An echo Doppler study in December 2014 showed an ejection fraction at 55-60% without wall motion abnormalities. There was mild aortic stenosis. Mitral valve was structurally normal.  Subsequently she had undergone a follow-up nuclear perfusion study due to symptoms of vague chest pain as well as exertional dyspnea which revealed normal perfusion.  Upper study was on July 27, 2016 and again showed LVEF at 55 to 60%.  There was grade 1 diastolic dysfunction.  There was minimal PA pressure elevation at 32 mm.  There was mild aortic sclerosis without restrictive mobility.  There was no AR.  He has been on lisinopril 5 mg and metoprolol 37.5 mg daily.  Her ECG shows sinus bradycardia.  Blood pressure today is mildly elevated per updated hypertensive guidelines.  With her diastolic dysfunction and mild blood pressure elevation I have recommended further titration of lisinopril to 10 mg daily.  She is not having any chest pain development with activity denies any anginal type symptoms.  Her ECG is stable and shows first-degree AV block.  She continues to be on rosuvastatin 40 mg for hyperlipidemia.  Laboratory in May 2019 by Dr. Patterson showed an LDL cholesterol at 62.  Had normal renal function with creatinine of 0.8.  Is a history of peripheral neuropathy and continues to take gabapentin 300 mg daily.  She also is on carbidopa levodopa.  She has prediabetes and her hemoglobin A1c in November 2019 was 6.0.  She is on metformin 500 mg daily.  I have recommended she follow-up with Dr. Patterson and repeat laboratory will be obtained.  As long as she remains stable I will see her in 1 year for reevaluation.  Time spent: 25 minutes Thomas A.  Kelly, MD, FACC  09/12/2018 1:45 PM   

## 2018-09-12 NOTE — Patient Instructions (Signed)
Medication Instructions:  Increase Lisinopril to 10 mg daily  If you need a refill on your cardiac medications before your next appointment, please call your pharmacy.    Follow-Up: At [facility], you and your health needs are our priority.  As part of our continuing mission to provide you with exceptional heart care, we have created designated Provider Care Teams.  These Care Teams include your primary Cardiologist (physician) and Advanced Practice Providers (APPs -  Physician Assistants and Nurse Practitioners) who all work together to provide you with the care you need, when you need it. You will need a follow up appointment in 12 months.  Please call our office 2 months in advance to schedule this appointment.  You may see Dr.Kelly or one of the following Advanced Practice Providers on your designated Care Team: Almyra Deforest, [place] . Fabian Sharp, PA-C

## 2018-11-01 ENCOUNTER — Encounter: Payer: Self-pay | Admitting: Gastroenterology

## 2018-12-26 ENCOUNTER — Encounter: Payer: Self-pay | Admitting: Cardiovascular Disease

## 2019-01-30 ENCOUNTER — Ambulatory Visit: Payer: Self-pay

## 2019-01-30 ENCOUNTER — Ambulatory Visit (INDEPENDENT_AMBULATORY_CARE_PROVIDER_SITE_OTHER): Payer: Medicare Other | Admitting: Physical Medicine and Rehabilitation

## 2019-01-30 ENCOUNTER — Other Ambulatory Visit: Payer: Self-pay

## 2019-01-30 ENCOUNTER — Encounter: Payer: Self-pay | Admitting: Physical Medicine and Rehabilitation

## 2019-01-30 VITALS — BP 159/83 | HR 55

## 2019-01-30 DIAGNOSIS — M5441 Lumbago with sciatica, right side: Secondary | ICD-10-CM | POA: Diagnosis not present

## 2019-01-30 DIAGNOSIS — G8929 Other chronic pain: Secondary | ICD-10-CM

## 2019-01-30 DIAGNOSIS — M48061 Spinal stenosis, lumbar region without neurogenic claudication: Secondary | ICD-10-CM

## 2019-01-30 DIAGNOSIS — M5416 Radiculopathy, lumbar region: Secondary | ICD-10-CM

## 2019-01-30 MED ORDER — BETAMETHASONE SOD PHOS & ACET 6 (3-3) MG/ML IJ SUSP: 12.0000 mg | Freq: Once | INTRAMUSCULAR | Status: DC

## 2019-01-30 NOTE — Progress Notes (Signed)
 .  Numeric Pain Rating Scale and Functional Assessment Average Pain 5   In the last MONTH (on 0-10 scale) has pain interfered with the following?  1. General activity like being  able to carry out your everyday physical activities such as walking, climbing stairs, carrying groceries, or moving a chair?  Rating(4)   +Driver, -BT, -Dye Allergies.

## 2019-03-17 ENCOUNTER — Encounter: Payer: Self-pay | Admitting: Physical Medicine and Rehabilitation

## 2019-03-17 NOTE — Progress Notes (Signed)
[name] - 84 y.o. female MRN [id number]  Date of birth: Aug 20, 1934  Office Visit Note: Visit Date: 01/30/2019 PCP: Leanna Battles, MD Referred by: Leanna Battles, MD  Subjective: Chief Complaint  Patient presents with  . Lower Back - Pain  . Right Leg - Pain   HPI: [name] is a 84 y.o. female who comes in today At the request of Dr. Janie Morning for evaluation management of low back and right hip and leg pain.  Patient reports a chronic long-term history of back pain and a chronic several month history of radiating right hip and leg pain and a pretty classic L5 distribution down to the ankle and foot.  She reports that the symptoms are very painful but she does report an average pain of 5 out of 10.  It does affect her daily living.  She reports standing and walking make the symptoms worse.  She has really only been using Tylenol for pain and does not really want to take anything else.  She has allergy and intolerances to codeine.  She has had some therapy.  She has had MRI of her right hip as well as MRI of the lumbar spine.  Both of these are reviewed below and reviewed with the patient today.  Hip MRI shows mild osteoarthritis with some fraying of the labrum.  MRI of the lumbar spine shows significant worsening of right foraminal stenosis at L5-S1.  This would fit with her symptomatology.  She is had no focal weakness.  She has felt like she has wanted to fall at times.  She has had no foot drop no bowel or bladder changes.  She has had no other red flag complaints.  She tells me today that she really does not want any injections that she would like to have the situation fixed.  Her case is complicated by being prediabetic with some coronary artery disease.  She has a history of shingles and postherpetic neuralgia.  She does suffer from anxiety.  Review of Systems  Constitutional: Negative for chills, fever, malaise/fatigue and weight loss.  HENT: Negative for  hearing loss and sinus pain.   Eyes: Negative for blurred vision, double vision and photophobia.  Respiratory: Negative for cough and shortness of breath.   Cardiovascular: Negative for chest pain, palpitations and leg swelling.  Gastrointestinal: Negative for abdominal pain, nausea and vomiting.  Genitourinary: Negative for flank pain.  Musculoskeletal: Positive for back pain and joint pain. Negative for myalgias.  Skin: Negative for itching and rash.  Neurological: Positive for tingling. Negative for tremors, focal weakness and weakness.  Endo/Heme/Allergies: Negative.   Psychiatric/Behavioral: Negative for depression.  All other systems reviewed and are negative.  Otherwise per HPI.  Assessment & Plan: Visit Diagnoses:  1. Lumbar radiculopathy   2. Foraminal stenosis of lumbar region   3. Chronic right-sided low back pain with right-sided sciatica     Plan: Findings:  Chronic history of back pain likely facet mediated low back pain but now with right radicular leg pain pretty severe in an L5 distribution affecting her daily living and not relieved with medication management, therapy and activity modification.  After reviewing spine images and looking at foraminal stenosis and talking about the usefulness of injections and other treatments the patient does want to be referred to a neurosurgeon for possible foraminotomies or other surgical procedure.  We did place a referral for Dr. Sherley Bounds.    Meds & Orders:  Meds ordered this encounter  Medications  . DISCONTD: betamethasone acetate-betamethasone sodium phosphate (CELESTONE) injection 12 mg    Orders Placed This Encounter  Procedures  . Ambulatory referral to Neurosurgery    Follow-up: Return if symptoms worsen or fail to improve.   Procedures: No procedures performed  No notes on file   Clinical History: MRI LUMBAR SPINE WITHOUT CONTRAST  TECHNIQUE: Multiplanar, multisequence MR imaging of the lumbar spine was  performed. No intravenous contrast was administered.  COMPARISON: Lumbar MRI 07/04/2014  FINDINGS: Segmentation: Normal  Alignment: Mild anterolisthesis L4-5, 2 mm. Slight anterolisthesis L5-S1  Vertebrae: 8 mm hypointense lesion T12 vertebral body unchanged from the prior study. Edema superior endplate of L1 is new and compatible with Schmorl's node. Negative for fracture or mass.  Conus medullaris and cauda equina: Conus extends to the L1-2 level. Conus and cauda equina appear normal.  Paraspinal and other soft tissues: Negative for mass or adenopathy. Left renal cyst stable  Disc levels:  L1-2: Negative  L2-3: Mild disc degeneration and disc bulging without stenosis  L3-4: Mild disc degeneration without stenosis  L4-5: Slight anterolisthesis. Mild disc and facet degeneration. Negative for spinal stenosis. Mild subarticular stenosis bilaterally.  L5-S1: Moderate facet degeneration bilaterally causing subarticular stenosis and foraminal stenosis on the right which has progressed in the interval.  IMPRESSION: Mild degenerative change L2-3, L3-4 without stenosis  Slight anterolisthesis L4-5 with disc and facet degeneration. Negative for stenosis  Mild right foraminal stenosis L5-S1 has progressed in the interval due to facet hypertrophy.   Electronically Signed By: Franchot Gallo M.D. On: 10/04/2017 13:33 ------- MRI Right Hip IMPRESSION: Mild degenerative change about the right hip with associated fraying of the superior labrum but no labral tear.  Lesion in left iliac wing can not be definitively characterized but has a benign appearance and may represent old fibrous dysplasia or an old bone cyst. Repeat MR imaging of the pelvis in 6 months to ensure stability is recommended.  Sigmoid diverticulosis.   Electronically Signed   By: Inge Rise M.D.   On: 08/19/2018 15:44   She reports that she has quit smoking. Her smoking use included  cigarettes. She has a 66.00 pack-year smoking history. She has never used smokeless tobacco. No results for input(s): HGBA1C, LABURIC in the last 8760 hours.  Objective:  VS:  HT:    WT:   BMI:     BP:(!) 159/83  HR:(!) 55bpm  TEMP: ( )  RESP:  Physical Exam Vitals signs and nursing note reviewed.  Constitutional:      General: She is not in acute distress.    Appearance: Normal appearance. She is well-developed.  HENT:     Head: Normocephalic and atraumatic.     Nose: Nose normal.     Mouth/Throat:     Mouth: Mucous membranes are moist.     Pharynx: Oropharynx is clear.  Eyes:     Conjunctiva/sclera: Conjunctivae normal.     Pupils: Pupils are equal, round, and reactive to light.  Neck:     Musculoskeletal: Normal range of motion and neck supple.  Cardiovascular:     Rate and Rhythm: Regular rhythm.  Pulmonary:     Effort: Pulmonary effort is normal. No respiratory distress.  Abdominal:     General: There is no distension.     Palpations: Abdomen is soft.     Tenderness: There is no guarding.  Musculoskeletal:     Right lower leg: No edema.     Left lower leg: No edema.  Comments: Patient somewhat slow to arise from a seated position to full extension.  She does have some pain with facet loading.  She has no identified trigger points noted in the lumbar spine although she is tender in her to deep palpation of the quadratus lumborum right more than left.  No pain over the greater trochanters.  No pain with hip rotation.  Good distal strength without clonus.  Some impaired sensation in an L5 dermatome on the right compared to left.  Equivocal slump test on the right.  Skin:    General: Skin is warm and dry.     Findings: No erythema or rash.  Neurological:     General: No focal deficit present.     Mental Status: She is alert and oriented to person, place, and time.     Motor: No abnormal muscle tone.     Coordination: Coordination normal.     Gait: Gait normal.   Psychiatric:        Mood and Affect: Mood normal.        Behavior: Behavior normal.        Thought Content: Thought content normal.     Ortho Exam Imaging: No results found.  Past Medical/Family/Surgical/Social History: Medications & Allergies reviewed per EMR, new medications updated. Patient Active Problem List   Diagnosis Date Noted  . Pain in joint of right hip 04/18/2018  . Anxiety 06/30/2016  . Insomnia 06/30/2016  . Nasolacrimal duct obstruction, acquired 02/21/2016  . Type 2 diabetes mellitus ([place]) 12/18/2015  . S/P cervical spinal fusion 07/30/2013  . CAD (coronary artery disease) 06/03/2013  . Restless leg syndrome 06/03/2013  . Essential hypertension 06/03/2013  . Cardiac murmur 06/03/2013  . Hyperlipidemia with target LDL less than 70 06/03/2013   Past Medical History:  Diagnosis Date  . Aneurysm ([place])    Right eye a non DES stent was placed so that she would not required long term dual antiplatelet therapy.  . Anxiety    not currently taking any meds  . Arthritis   . CAD (coronary artery disease)   . Diabetes mellitus without complication (HCC)    prediabetes per pt  . H/O hiatal hernia   . Heart murmur   . History of stress test 03/2012   The post stress myocardial perfusion images show a normal pattern of perfusion in all region. The post left ventricles is normal in size. There is no scintigraphic of inductible myocardial ischemia. The post EF is 73  . Hx of echocardiogram 11/2010   Ef 67% Normal size chambes, Aortic valve sclerosis without stenosis, No other significant valvular abnormalities, No percardial effusion.  . Hyperlipemia   . Hypertension   . Insulin resistance   . Multiple thyroid nodules   . Neuromuscular disorder (HCC)    nerve pain after shingles  . Osteoporosis   . Restless legs   . Shingles    Family History  Problem Relation Age of Onset  . Alzheimer's disease Mother 3  . Colon polyps Mother   . Diabetes Mother   . Heart  attack Father 69  . Cancer Maternal Grandmother 71  . Breast cancer Maternal Grandmother   . Diabetes Sister   . Breast cancer Maternal Uncle   . Breast cancer Maternal Aunt        x 2  . Colon polyps Sister   . Colon polyps Maternal Aunt   . Colon cancer Neg Hx   . Esophageal cancer Neg Hx   .  Rectal cancer Neg Hx   . Stomach cancer Neg Hx    Past Surgical History:  Procedure Laterality Date  . ABDOMINAL HYSTERECTOMY  1970  . ANGIOPLASTY     Stenting of a 90% eccentric right coronary artery stenosis and had a 3.0x15 mm Integrity bare-metal stent inserted  . ANTERIOR CERVICAL DECOMP/DISCECTOMY FUSION N/A 07/30/2013   Procedure: ANTERIOR CERVICAL DECOMPRESSION/DISCECTOMY FUSION CERVICAL FIVE -SIX;  Surgeon: Eustace Moore, MD;  Location: [facility];  Service: Neurosurgery;  Laterality: N/A;  . APPENDECTOMY    . BREAST SURGERY Left    cysts removed from left breast (in her 20'2)  . CARDIAC CATHETERIZATION     Showed a widely patent stent, she did have 60% ostial diagonal-1 stenosis, She also had mild luminal irregularities of her LAD.  Marland Kitchen COLONOSCOPY    . EYE SURGERY Bilateral 2009   cateract surgery- bilateral  . REFRACTIVE SURGERY Left 2011  . TONSILLECTOMY     Social History   Occupational History  . Not on file  Tobacco Use  . Smoking status: Former Smoker    Packs/day: 2.00    Years: 33.00    Pack years: 66.00    Types: Cigarettes  . Smokeless tobacco: Never Used  . Tobacco comment: quit in 1995.  Substance and Sexual Activity  . Alcohol use: Yes    Alcohol/week: 0.0 standard drinks    Comment: occasional (maybe twice a year)  . Drug use: No  . Sexual activity: Not on file

## 2019-05-05 ENCOUNTER — Telehealth: Payer: Self-pay | Admitting: Nurse Practitioner

## 2019-05-05 NOTE — Telephone Encounter (Signed)
Received a new patient appt from [facility] for bone lesion. [name] has been cld and scheduled to see [name] on 10/9 at 9:15am. She's been made aware to arrive 15-20 minutes early.   I cld and lft a vm for Rodolph Bong, admin asst at referring office, to obtain a clear copy of the MRI results. I lft my direct phone # and our fax number.

## 2019-05-07 ENCOUNTER — Other Ambulatory Visit: Payer: Self-pay | Admitting: *Deleted

## 2019-05-07 ENCOUNTER — Ambulatory Visit
Admission: RE | Admit: 2019-05-07 | Discharge: 2019-05-07 | Disposition: A | Discharge status: Home / Self Care | Payer: Self-pay | Source: Ambulatory Visit | Attending: Oncology | Admitting: Oncology

## 2019-05-07 DIAGNOSIS — M533 Sacrococcygeal disorders, not elsewhere classified: Secondary | ICD-10-CM

## 2019-05-07 DIAGNOSIS — M899 Disorder of bone, unspecified: Secondary | ICD-10-CM

## 2019-05-09 ENCOUNTER — Other Ambulatory Visit: Payer: Self-pay

## 2019-05-09 ENCOUNTER — Inpatient Hospital Stay: Payer: Medicare Other | Attending: Nurse Practitioner | Admitting: Nurse Practitioner

## 2019-05-09 ENCOUNTER — Inpatient Hospital Stay: Payer: Medicare Other

## 2019-05-09 ENCOUNTER — Encounter: Payer: Self-pay | Admitting: Nurse Practitioner

## 2019-05-09 VITALS — BP 156/64 | HR 59 | Temp 97.8°F | Resp 18 | Ht 64.0 in | Wt 173.5 lb

## 2019-05-09 DIAGNOSIS — M25552 Pain in left hip: Secondary | ICD-10-CM | POA: Diagnosis not present

## 2019-05-09 DIAGNOSIS — M545 Low back pain: Secondary | ICD-10-CM | POA: Diagnosis not present

## 2019-05-09 DIAGNOSIS — G8929 Other chronic pain: Secondary | ICD-10-CM

## 2019-05-09 DIAGNOSIS — C3432 Malignant neoplasm of lower lobe, left bronchus or lung: Secondary | ICD-10-CM | POA: Insufficient documentation

## 2019-05-09 DIAGNOSIS — I119 Hypertensive heart disease without heart failure: Secondary | ICD-10-CM | POA: Diagnosis not present

## 2019-05-09 DIAGNOSIS — Z23 Encounter for immunization: Secondary | ICD-10-CM | POA: Insufficient documentation

## 2019-05-09 DIAGNOSIS — E875 Hyperkalemia: Secondary | ICD-10-CM

## 2019-05-09 DIAGNOSIS — N289 Disorder of kidney and ureter, unspecified: Secondary | ICD-10-CM | POA: Insufficient documentation

## 2019-05-09 DIAGNOSIS — M899 Disorder of bone, unspecified: Secondary | ICD-10-CM

## 2019-05-09 DIAGNOSIS — Z87891 Personal history of nicotine dependence: Secondary | ICD-10-CM

## 2019-05-09 DIAGNOSIS — R918 Other nonspecific abnormal finding of lung field: Secondary | ICD-10-CM | POA: Diagnosis not present

## 2019-05-09 DIAGNOSIS — E119 Type 2 diabetes mellitus without complications: Secondary | ICD-10-CM | POA: Diagnosis not present

## 2019-05-09 DIAGNOSIS — I2510 Atherosclerotic heart disease of native coronary artery without angina pectoris: Secondary | ICD-10-CM | POA: Diagnosis not present

## 2019-05-09 DIAGNOSIS — Z7984 Long term (current) use of oral hypoglycemic drugs: Secondary | ICD-10-CM | POA: Diagnosis not present

## 2019-05-09 DIAGNOSIS — Z803 Family history of malignant neoplasm of breast: Secondary | ICD-10-CM | POA: Diagnosis not present

## 2019-05-09 DIAGNOSIS — I10 Essential (primary) hypertension: Secondary | ICD-10-CM

## 2019-05-09 LAB — CBC WITH DIFFERENTIAL (CANCER CENTER ONLY)
Abs Immature Granulocytes: 0.01 10*3/uL (ref 0.00–0.07)
Basophils Absolute: 0 10*3/uL (ref 0.0–0.1)
Basophils Relative: 0 %
Eosinophils Absolute: 0.1 10*3/uL (ref 0.0–0.5)
Eosinophils Relative: 1 %
HCT: 39.7 % (ref 36.0–46.0)
Hemoglobin: 13.1 g/dL (ref 12.0–15.0)
Immature Granulocytes: 0 %
Lymphocytes Relative: 25 %
Lymphs Abs: 1.5 10*3/uL (ref 0.7–4.0)
MCH: 30.5 pg (ref 26.0–34.0)
MCHC: 33 g/dL (ref 30.0–36.0)
MCV: 92.5 fL (ref 80.0–100.0)
Monocytes Absolute: 0.5 10*3/uL (ref 0.1–1.0)
Monocytes Relative: 8 %
Neutro Abs: 3.9 10*3/uL (ref 1.7–7.7)
Neutrophils Relative %: 66 %
Platelet Count: 180 10*3/uL (ref 150–400)
RBC: 4.29 MIL/uL (ref 3.87–5.11)
RDW: 12.3 % (ref 11.5–15.5)
WBC Count: 6 10*3/uL (ref 4.0–10.5)
nRBC: 0 % (ref 0.0–0.2)

## 2019-05-09 LAB — URINALYSIS, COMPLETE (UACMP) WITH MICROSCOPIC
Bilirubin Urine: NEGATIVE
Glucose, UA: NEGATIVE mg/dL
Ketones, ur: NEGATIVE mg/dL
Leukocytes,Ua: NEGATIVE
Nitrite: NEGATIVE
Protein, ur: NEGATIVE mg/dL
Specific Gravity, Urine: 1.005 (ref 1.005–1.030)
pH: 6 (ref 5.0–8.0)

## 2019-05-09 LAB — CMP (CANCER CENTER ONLY)
ALT: 18 U/L (ref 0–44)
AST: 24 U/L (ref 15–41)
Albumin: 4.5 g/dL (ref 3.5–5.0)
Alkaline Phosphatase: 76 U/L (ref 38–126)
Anion gap: 7 (ref 5–15)
BUN: 23 mg/dL (ref 8–23)
CO2: 27 mmol/L (ref 22–32)
Calcium: 10 mg/dL (ref 8.9–10.3)
Chloride: 103 mmol/L (ref 98–111)
Creatinine: 0.87 mg/dL (ref 0.44–1.00)
GFR, Est AFR Am: >60 mL/min (ref >60)
GFR, Estimated: >60 mL/min (ref >60)
Glucose, Bld: 110 mg/dL — ABNORMAL HIGH (ref 70–99)
Potassium: 4.7 mmol/L (ref 3.5–5.1)
Sodium: 137 mmol/L (ref 135–145)
Total Bilirubin: 0.4 mg/dL (ref 0.3–1.2)
Total Protein: 7.6 g/dL (ref 6.5–8.1)

## 2019-05-09 NOTE — Progress Notes (Addendum)
New Hematology/Oncology Consult   Requesting MD: Dr. Sherley Bounds    Reason for Consult: Bone lesions on MRI  HPI: [name] is an 84 year old woman with chronic back pain, recent onset left hip and groin pain. She was found to have concerning bone lesions on recent MRI.  MRI of the lumbar spine on 04/29/2019 showed enlarging marrow lesions involving the L1 vertebral body, upper left sacrum and right iliac bone.  Pelvic MRI also done 04/29/2019 showed several enlarging/new bone marrow lesions with enhancement including a lesion in the left iliac bone, left superior pubic ramus, left superior acetabulum and upper left sacrum.     Past Medical History:  Diagnosis Date  . Aneurysm ([place])    Right eye a non DES stent was placed so that she would not required long term dual antiplatelet therapy.  . Anxiety    not currently taking any meds  . Arthritis   . CAD (coronary artery disease)   . Diabetes mellitus without complication (HCC)    prediabetes per pt  . H/O hiatal hernia   . Heart murmur   . History of stress test 03/2012   The post stress myocardial perfusion images show a normal pattern of perfusion in all region. The post left ventricles is normal in size. There is no scintigraphic of inductible myocardial ischemia. The post EF is 73  . Hx of echocardiogram 11/2010   Ef 67% Normal size chambes, Aortic valve sclerosis without stenosis, No other significant valvular abnormalities, No percardial effusion.  . Hyperlipemia   . Hypertension   . Insulin resistance   . Multiple thyroid nodules   . Neuromuscular disorder (HCC)    nerve pain after shingles  . Osteoporosis   . Restless legs   . Shingles   :   Past Surgical History:  Procedure Laterality Date  . ABDOMINAL HYSTERECTOMY  1970  . ANGIOPLASTY     Stenting of a 90% eccentric right coronary artery stenosis and had a 3.0x15 mm Integrity bare-metal stent inserted  . ANTERIOR CERVICAL DECOMP/DISCECTOMY FUSION N/A 07/30/2013    Procedure: ANTERIOR CERVICAL DECOMPRESSION/DISCECTOMY FUSION CERVICAL FIVE -SIX;  Surgeon: Eustace Moore, MD;  Location: [facility];  Service: Neurosurgery;  Laterality: N/A;  . APPENDECTOMY    . BREAST SURGERY Left    cysts removed from left breast (in her 20'2)  . CARDIAC CATHETERIZATION     Showed a widely patent stent, she did have 60% ostial diagonal-1 stenosis, She also had mild luminal irregularities of her LAD.  Marland Kitchen COLONOSCOPY    . EYE SURGERY Bilateral 2009   cateract surgery- bilateral  . REFRACTIVE SURGERY Left 2011  . TONSILLECTOMY    :   Current Outpatient Medications:  .  ALPRAZolam (XANAX) 0.5 MG tablet, Take 1 tablet by mouth as needed., Disp: , Rfl:  .  Biotin 5 MG CAPS, Take 1 capsule by mouth. Pt states it is 1000 units dose, Disp: , Rfl:  .  gabapentin (NEURONTIN) 300 MG capsule, Take 300 mg by mouth once. , Disp: , Rfl:  .  Magnesium 250 MG TABS, Take 500 mg by mouth at bedtime., Disp: , Rfl:  .  metFORMIN (GLUCOPHAGE-XR) 500 MG 24 hr tablet, Take 1 tablet by mouth daily., Disp: , Rfl:  .  metoprolol tartrate (LOPRESSOR) 25 MG tablet, Take 37.5 mg by mouth daily. Take 1 and 1/2 tablet daily, Disp: , Rfl:  .  Multiple Vitamins-Minerals (CENTRUM SILVER PO), Take 1 tablet by mouth daily., Disp: ,  Rfl:  .  nitroGLYCERIN (NITROSTAT) 0.4 MG SL tablet, Place 0.4 mg under the tongue every 5 (five) minutes as needed for chest pain., Disp: , Rfl:  .  polyethylene glycol (MIRALAX / GLYCOLAX) packet, Take 17 g by mouth daily., Disp: , Rfl:  .  rosuvastatin (CRESTOR) 40 MG tablet, Take 40 mg by mouth daily., Disp: , Rfl:  .  traZODone (DESYREL) 100 MG tablet, Take 100 mg by mouth at bedtime., Disp: , Rfl:  .  zinc gluconate 50 MG tablet, Take 50 mg by mouth daily., Disp: , Rfl:  .  carbidopa-levodopa (SINEMET IR) 25-100 MG per tablet, Take one-fourth tablet two times daily, Disp: , Rfl:  .  lisinopril (PRINIVIL,ZESTRIL) 10 MG tablet, Take 1 tablet (10 mg total) by mouth  daily., Disp: 90 tablet, Rfl: 3 .  Vitamin D, Ergocalciferol, (DRISDOL) 50000 UNITS CAPS capsule, Take 50,000 Units by mouth every 7 (seven) days., Disp: , Rfl: :  :   Allergies  Allergen Reactions  . Codeine Nausea And Vomiting and Other (See Comments)    Severe constipation.  :  FH: Mother deceased with Alzheimer's; maternal uncle with breast cancer, maternal aunt with breast cancer, maternal aunt with breast cancer, maternal aunt with breast cancer, maternal grandmother breast cancer.  SOCIAL HISTORY: She lives in [place] with her husband.  She has a daughter, age 28, in good health.  She is retired from a [employer].  She quit smoking in 1995.  No alcohol use.  Review of Systems: She reports a 3-week history of left hip/groin pain.  She has worsening left shoulder pain.  She has pain over the low abdomen/pelvis.  She has upper back pain at the "bra line".  She has chronic right low back pain.  She reports worsening incontinence of urine over the past 3 weeks.  She is able to tell when she needs to urinate.  No bleeding with urination.  She feels like she has a urinary tract infection.  No vaginal bleeding.  She has had no change in bowel habits.  No bloody or black stools.  No unusual headaches or vision change.  No fevers or sweats.  She reports a good appetite.  No weight loss.  She has intermittent mild dysphagia which is not a new problem.  No nausea or vomiting.  No cough or shortness of breath.  No change over either breast.  She has decreased vision involving the right eye which is related to an "aneurysm".  She reports undergoing a hysterectomy due to endometriosis at age 22 and thinks she has one ovary remaining.  No skin rash.  She reports having a basal cell carcinoma removed in the past.  No new or changing skin lesions.  Physical Exam:  Blood pressure (!) 156/64, pulse (!) 59, temperature 97.8 F (36.6 C), temperature source Temporal, resp. rate 18, height 5\' 4"  (1.626  m), weight 173 lb 8 oz (78.7 kg), SpO2 97 %.  HEENT: Neck without mass.  Sclera anicteric. Lungs: Lungs clear bilaterally.  No wheezes or rales. Cardiac: Regular rate and rhythm. Abdomen: Abdomen soft.  Slight fullness at the low abdomen/pelvic region.  Tender over the pelvic region.  No hepatomegaly.  No splenomegaly. Vascular: No leg edema. Lymph nodes: No palpable cervical, supraclavicular, axillary or inguinal lymph nodes. Neurologic: Alert and oriented.  Motor strength 5/5. Skin: No rash. Musculoskeletal: Tender at the left hip, mid back at the bra line.  Tender over the pubic bone. Breasts: Prominent, slightly firm tissue  right breast 11:00.  LABS:  RADIOLOGY:  No results found.  Assessment and Plan:   1. Bone lesions on MRI  MRI lumbar spine 04/29/2019- enlarging marrow lesions involving the L1 vertebral body, upper left sacrum and right iliac bone  MRI pelvis 04/29/2019- 3.5 cm left iliac bone lesion appears slightly larger; other similar appearing lesions present within the left superior pubic ramus, left superior abdomen acetabulum and upper left sacrum 2. Pain secondary to #1  3. Chronic back pain 4. Type 2 diabetes 5. Essential hypertension 6. CAD 7. Hyperlipidemia 8. Family history significant for multiple members with breast cancer  [name] is an 84 year old woman recently found to have multiple bone lesions on lumbar and pelvic MRI.  We discussed the concern that the bone lesions represent a malignancy.  We are referring her for staging CT scans.  She is symptomatic with pain.  At present she is taking Tylenol and naproxen.  She has tramadol if needed.  She understands to contact the office prior to her next visit with worsening pain or onset of new symptoms.  She will return for a follow-up visit on 05/14/2019 to review the CT scans.  She will contact the office in the interim as outlined above or with any other problems.  Patient seen with Dr. Benay Spice.  65  minutes were spent face-to-face at today's visit with the majority of that time involved in counseling/coordination of care.  Ned Card, NP 05/09/2019, 9:59 AM   This was a shared visit with Ned Card.  [name] was interviewed and examined.  We reviewed the MRI images with her.  She presents with pain, likely secondary to metastatic bone lesions.  We discussed the differential diagnosis with [name].  Probable primary tumor sites include the breast and lung, though there is a broad differential diagnosis.  Her husband was present for today's visit and her daughter was present by telephone.  She will be scheduled for restaging CT scans.  She will return for an office visit on 05/14/2019.  The plan is to arrange for a biopsy based on the CT result.  Julieanne Manson, MD

## 2019-05-10 LAB — URINE CULTURE: Culture: <10000 — AB

## 2019-05-11 ENCOUNTER — Encounter: Payer: Self-pay | Admitting: Nurse Practitioner

## 2019-05-11 ENCOUNTER — Other Ambulatory Visit: Payer: Self-pay | Admitting: Oncology

## 2019-05-11 DIAGNOSIS — M899 Disorder of bone, unspecified: Secondary | ICD-10-CM

## 2019-05-12 ENCOUNTER — Inpatient Hospital Stay: Payer: Medicare Other

## 2019-05-12 ENCOUNTER — Ambulatory Visit (HOSPITAL_COMMUNITY): Payer: Medicare Other

## 2019-05-12 ENCOUNTER — Other Ambulatory Visit: Payer: Self-pay

## 2019-05-12 ENCOUNTER — Telehealth: Payer: Self-pay | Admitting: *Deleted

## 2019-05-12 ENCOUNTER — Telehealth: Payer: Self-pay | Admitting: Nurse Practitioner

## 2019-05-12 ENCOUNTER — Ambulatory Visit (HOSPITAL_COMMUNITY)
Admission: RE | Admit: 2019-05-12 | Discharge: 2019-05-12 | Disposition: A | Discharge status: Home / Self Care | Payer: Medicare Other | Source: Ambulatory Visit | Attending: Nurse Practitioner | Admitting: Nurse Practitioner

## 2019-05-12 DIAGNOSIS — R918 Other nonspecific abnormal finding of lung field: Secondary | ICD-10-CM | POA: Diagnosis not present

## 2019-05-12 DIAGNOSIS — N289 Disorder of kidney and ureter, unspecified: Secondary | ICD-10-CM | POA: Insufficient documentation

## 2019-05-12 DIAGNOSIS — M899 Disorder of bone, unspecified: Secondary | ICD-10-CM | POA: Diagnosis present

## 2019-05-12 MED ORDER — IOHEXOL 300 MG/ML  SOLN
100.0000 mL | Freq: Once | INTRAMUSCULAR | Status: AC | PRN
  Administered 2019-05-12: 100 mL via INTRAVENOUS

## 2019-05-12 MED ORDER — SODIUM CHLORIDE (PF) 0.9 % IJ SOLN
INTRAMUSCULAR | Status: AC
  Filled 2019-05-12: qty 50

## 2019-05-12 NOTE — Telephone Encounter (Addendum)
Called patient to schedule for lab today since additional testing MD requested could not be run off the sample collected on 05/09/19. She agrees to come today at 1:30 pm prior to her CT scan at 2:30 pm. Also asking about the Wednesday appointment ordered that she does not see in [place] yet. Sent message to scheduling to follow up.

## 2019-05-12 NOTE — Telephone Encounter (Signed)
Scheduled per los. Called and left msg

## 2019-05-13 LAB — KAPPA/LAMBDA LIGHT CHAINS
Kappa free light chain: 25.3 mg/L — ABNORMAL HIGH (ref 3.3–19.4)
Kappa, lambda light chain ratio: 1.78 — ABNORMAL HIGH (ref 0.26–1.65)
Lambda free light chains: 14.2 mg/L (ref 5.7–26.3)

## 2019-05-14 ENCOUNTER — Telehealth: Payer: Self-pay

## 2019-05-14 ENCOUNTER — Encounter: Payer: Self-pay | Admitting: Nurse Practitioner

## 2019-05-14 ENCOUNTER — Inpatient Hospital Stay (HOSPITAL_BASED_OUTPATIENT_CLINIC_OR_DEPARTMENT_OTHER): Payer: Medicare Other | Admitting: Nurse Practitioner

## 2019-05-14 ENCOUNTER — Other Ambulatory Visit: Payer: Self-pay

## 2019-05-14 ENCOUNTER — Telehealth: Payer: Self-pay | Admitting: Nurse Practitioner

## 2019-05-14 VITALS — BP 152/72 | HR 59 | Temp 98.2°F | Resp 17 | Ht 64.0 in | Wt 172.8 lb

## 2019-05-14 DIAGNOSIS — R918 Other nonspecific abnormal finding of lung field: Secondary | ICD-10-CM

## 2019-05-14 DIAGNOSIS — M899 Disorder of bone, unspecified: Secondary | ICD-10-CM | POA: Diagnosis not present

## 2019-05-14 NOTE — Telephone Encounter (Signed)
Scheduled appt per 10/14 los - -pt aware of appt date and time

## 2019-05-14 NOTE — Telephone Encounter (Signed)
Per Ned Card NP called  [facility] [phone]) for a copy of the most recent mammogram. They are faxing over recent one from June to (fax# [phone])

## 2019-05-14 NOTE — Progress Notes (Addendum)
[facility] OFFICE PROGRESS NOTE   Diagnosis: Bone lesions, lung mass  INTERVAL HISTORY:   [name] returns as scheduled.  She continues to have pain at the left hip/groin.  She feels like she has a "catch" at the right hip.  She continues Tylenol and naproxen.  She has not tried tramadol yet.  Appetite is "okay".  No nausea or vomiting.  Bowels are moving.  She developed loose stools after the CT contrast.  She continues to have some urinary incontinence.  Objective:  Vital signs in last 24 hours:  Blood pressure (!) 152/72, pulse (!) 59, temperature 98.2 F (36.8 C), resp. rate 17, height 5\' 4"  (1.626 m), weight 172 lb 12.8 oz (78.4 kg), SpO2 98 %.    HEENT: Neck without mass. Lymphatics: No palpable cervical or supraclavicular lymph nodes. GI: Abdomen soft and nontender. Vascular: No leg edema. Neuro: Alert and oriented.    Lab Results:  Lab Results  Component Value Date   WBC 6.0 05/09/2019   HGB 13.1 05/09/2019   HCT 39.7 05/09/2019   MCV 92.5 05/09/2019   PLT 180 05/09/2019   NEUTROABS 3.9 05/09/2019    Imaging:  Ct Chest W Contrast  Result Date: 05/12/2019 CLINICAL DATA:  History of spinal cord and bone lesions EXAM: CT CHEST, ABDOMEN, AND PELVIS WITH CONTRAST TECHNIQUE: Multidetector CT imaging of the chest, abdomen and pelvis was performed following the standard protocol during bolus administration of intravenous contrast. CONTRAST:  162mL OMNIPAQUE IOHEXOL 300 MG/ML  SOLN COMPARISON:  MRI of the spine of 04/29/2019 and MRI of the pelvis 08/19/2018 FINDINGS: CT CHEST FINDINGS Cardiovascular: Calcified atherosclerotic changes throughout the thoracic aorta. Heart size mildly enlarged without pericardial effusion and signs of coronary artery disease. Mediastinum/Nodes: At the thoracic inlet there is enlargement of the right hemi thyroid with either a large confluent lesion or multiple small lesions some which measure as large as 1 point 21.3 cm.  Lungs/Pleura: Lobulated solid mass lesion in the left lung base measures 3.3 by 3.2 cm in greatest axial dimension. This is not associated with pleural nodularity or pleural fluid but does extend to the pleural surface in the left posterior chest. Bibasilar atelectasis.  Airways are patent. Musculoskeletal: No chest wall mass or suspicious bone lesions identified. CT ABDOMEN PELVIS FINDINGS Hepatobiliary: No focal liver abnormality is seen. No gallstones, gallbladder wall thickening, or biliary dilatation. Pancreas: Unremarkable. No pancreatic ductal dilatation or surrounding inflammatory changes. Spleen: Normal in size without focal abnormality. Adrenals/Urinary Tract: Normal bilateral adrenal glands. No signs of hydronephrosis. Indeterminate low-density renal lesion on the left measures 1.1 cm seen in the posterior hilar lip, density approximately 37 Hounsfield units. Stomach/Bowel: w small hiatal hernia. No signs of bowel obstruction or acute bowel process. Vascular/Lymphatic: Moderate calcific atherosclerotic change throughout the abdominal aorta. No signs of aneurysm or acute intra-abdominal vascular finding. No signs of upper abdominal or pelvic lymphadenopathy. Reproductive: Post hysterectomy. Other: No abdominal wall hernia or abnormality. No abdominopelvic ascites. Musculoskeletal: Lytic process in the left iliac bone better seen on recent MRI. Spinal lesions also better displayed on previous MRI evaluations. No signs of acute fracture. Subtle sclerosis beneath an area of endplate deformity that has an appearance of Schmorl's node in the upper margin of the L1 vertebral body. Better displayed on MRI evaluation of 04/29/2019 Along the margin of the left scapula is a 6 mm area of lucency. IMPRESSION: Left lower lobe pulmonary mass suspicious for either primary or metastatic process. Bone lesions raising the  question of metastatic disease as well. Right thyroidal enlargement with heterogeneous appearance with  potential for multiple discrete lesions, consider thyroid ultrasound for further assessment. Indeterminate left renal lesions may represent small hemorrhagic cysts, sonogram could be performed for further assessment as warranted. Electronically Signed   By: Zetta Bills M.D.   On: 05/12/2019 16:44   Ct Abdomen Pelvis W Contrast  Result Date: 05/12/2019 CLINICAL DATA:  History of spinal cord and bone lesions EXAM: CT CHEST, ABDOMEN, AND PELVIS WITH CONTRAST TECHNIQUE: Multidetector CT imaging of the chest, abdomen and pelvis was performed following the standard protocol during bolus administration of intravenous contrast. CONTRAST:  135mL OMNIPAQUE IOHEXOL 300 MG/ML  SOLN COMPARISON:  MRI of the spine of 04/29/2019 and MRI of the pelvis 08/19/2018 FINDINGS: CT CHEST FINDINGS Cardiovascular: Calcified atherosclerotic changes throughout the thoracic aorta. Heart size mildly enlarged without pericardial effusion and signs of coronary artery disease. Mediastinum/Nodes: At the thoracic inlet there is enlargement of the right hemi thyroid with either a large confluent lesion or multiple small lesions some which measure as large as 1 point 21.3 cm. Lungs/Pleura: Lobulated solid mass lesion in the left lung base measures 3.3 by 3.2 cm in greatest axial dimension. This is not associated with pleural nodularity or pleural fluid but does extend to the pleural surface in the left posterior chest. Bibasilar atelectasis.  Airways are patent. Musculoskeletal: No chest wall mass or suspicious bone lesions identified. CT ABDOMEN PELVIS FINDINGS Hepatobiliary: No focal liver abnormality is seen. No gallstones, gallbladder wall thickening, or biliary dilatation. Pancreas: Unremarkable. No pancreatic ductal dilatation or surrounding inflammatory changes. Spleen: Normal in size without focal abnormality. Adrenals/Urinary Tract: Normal bilateral adrenal glands. No signs of hydronephrosis. Indeterminate low-density renal lesion on  the left measures 1.1 cm seen in the posterior hilar lip, density approximately 37 Hounsfield units. Stomach/Bowel: w small hiatal hernia. No signs of bowel obstruction or acute bowel process. Vascular/Lymphatic: Moderate calcific atherosclerotic change throughout the abdominal aorta. No signs of aneurysm or acute intra-abdominal vascular finding. No signs of upper abdominal or pelvic lymphadenopathy. Reproductive: Post hysterectomy. Other: No abdominal wall hernia or abnormality. No abdominopelvic ascites. Musculoskeletal: Lytic process in the left iliac bone better seen on recent MRI. Spinal lesions also better displayed on previous MRI evaluations. No signs of acute fracture. Subtle sclerosis beneath an area of endplate deformity that has an appearance of Schmorl's node in the upper margin of the L1 vertebral body. Better displayed on MRI evaluation of 04/29/2019 Along the margin of the left scapula is a 6 mm area of lucency. IMPRESSION: Left lower lobe pulmonary mass suspicious for either primary or metastatic process. Bone lesions raising the question of metastatic disease as well. Right thyroidal enlargement with heterogeneous appearance with potential for multiple discrete lesions, consider thyroid ultrasound for further assessment. Indeterminate left renal lesions may represent small hemorrhagic cysts, sonogram could be performed for further assessment as warranted. Electronically Signed   By: Zetta Bills M.D.   On: 05/12/2019 16:44    Medications: I have reviewed the patient's current medications.  Assessment/Plan: 1. Bone lesions on MRI  MRI lumbar spine 04/29/2019- enlarging marrow lesions involving the L1 vertebral body, upper left sacrum and right iliac bone  MRI pelvis 04/29/2019- 3.5 cm left iliac bone lesion appears slightly larger; other similar appearing lesions present within the left superior pubic ramus, left superior abdomen acetabulum and upper left sacrum  Kappa free light chains  with mild elevation 05/12/2019   CTs 05/12/2019- left lower lobe pulmonary  mass 3.3 x 3.2 cm; lytic process left iliac bone; spinal lesions; 1.1 cm low-density left kidney lesion; right thyroid enlargement with heterogeneous appearance with potential for multiple discrete lesions 2. Pain secondary to #1  3. Chronic back pain 4. Type 2 diabetes 5. Essential hypertension 6. CAD 7. Hyperlipidemia 8. Family history significant for multiple members with breast cancer  Disposition: [name] appears unchanged.  She continues to have pain at the left hip/groin.  The recent CTs showed a left lower lobe lung mass, lytic bone lesions and a left kidney lesion.  We are referring her for a PET scan as well as biopsy of the lung mass.  She will return for a follow-up visit on 05/26/2019 to review the results.  Patient seen with Dr. Benay Spice.  CT images reviewed on the computer with Ms. Avis and her husband.  25 minutes were spent face-to-face at today's visit with the majority of that time involved in counseling/coordination of care.    Ned Card ANP/GNP-BC   05/14/2019  9:44 AM  This was a shared visit with Ned Card.  We reviewed the CT findings with [name] and her husband. There is a left lung mass.  She may have a primary lung cancer with metastatic disease to the bones, or less likely another malignancy metastatic to the lungs.  Interventional radiology has reviewed the CT images and agrees to a biopsy of the lung mass.  They prefer a staging PET scan prior to the biopsy.  [name] will return for an office visit after the biopsy.  She will contact us sooner as needed.  Julieanne Manson, MD

## 2019-05-14 NOTE — Telephone Encounter (Signed)
Per Ned Card NP called central scheduling [phone]) and scheduled patient for PET scan. PET scan is scheduled for Monday 05/19/2019 @12 :30 at [facility] [place].  Patient is to arrive by 12, be NPO 6 hours before, have no carbs the night before, and hold any diabetes medication that morning. Patient is aware of appointment time, date, location, and instructions

## 2019-05-15 LAB — MULTIPLE MYELOMA PANEL, SERUM
Albumin SerPl Elph-Mcnc: 4.1 g/dL (ref 2.9–4.4)
Albumin/Glob SerPl: 1.5 (ref 0.7–1.7)
Alpha 1: 0.2 g/dL (ref 0.0–0.4)
Alpha2 Glob SerPl Elph-Mcnc: 0.8 g/dL (ref 0.4–1.0)
B-Globulin SerPl Elph-Mcnc: 1.1 g/dL (ref 0.7–1.3)
Gamma Glob SerPl Elph-Mcnc: 0.8 g/dL (ref 0.4–1.8)
Globulin, Total: 2.9 g/dL (ref 2.2–3.9)
IgA: 259 mg/dL (ref 64–422)
IgG (Immunoglobin G), Serum: 764 mg/dL (ref 586–1602)
IgM (Immunoglobulin M), Srm: 61 mg/dL (ref 26–217)
Total Protein ELP: 7 g/dL (ref 6.0–8.5)

## 2019-05-19 ENCOUNTER — Other Ambulatory Visit: Payer: Self-pay

## 2019-05-19 ENCOUNTER — Ambulatory Visit
Admission: RE | Admit: 2019-05-19 | Discharge: 2019-05-19 | Disposition: A | Discharge status: Home / Self Care | Payer: Medicare Other | Source: Ambulatory Visit | Attending: Nurse Practitioner | Admitting: Nurse Practitioner

## 2019-05-19 DIAGNOSIS — R918 Other nonspecific abnormal finding of lung field: Secondary | ICD-10-CM | POA: Insufficient documentation

## 2019-05-19 DIAGNOSIS — K5730 Diverticulosis of large intestine without perforation or abscess without bleeding: Secondary | ICD-10-CM | POA: Diagnosis not present

## 2019-05-19 DIAGNOSIS — I2510 Atherosclerotic heart disease of native coronary artery without angina pectoris: Secondary | ICD-10-CM | POA: Insufficient documentation

## 2019-05-19 DIAGNOSIS — M899 Disorder of bone, unspecified: Secondary | ICD-10-CM | POA: Insufficient documentation

## 2019-05-19 DIAGNOSIS — E042 Nontoxic multinodular goiter: Secondary | ICD-10-CM | POA: Insufficient documentation

## 2019-05-19 LAB — GLUCOSE, CAPILLARY: Glucose-Capillary: 97 mg/dL (ref 70–99)

## 2019-05-19 MED ORDER — FLUDEOXYGLUCOSE F - 18 (FDG) INJECTION
8.5000 | Freq: Once | INTRAVENOUS | Status: AC | PRN
  Administered 2019-05-19: 12:00:00 8.5 via INTRAVENOUS

## 2019-05-20 ENCOUNTER — Encounter (HOSPITAL_COMMUNITY): Payer: Self-pay | Admitting: Radiology

## 2019-05-20 ENCOUNTER — Telehealth: Payer: Self-pay | Admitting: *Deleted

## 2019-05-20 NOTE — Telephone Encounter (Signed)
Called patient to reschedule 10/26 follow up to 10/29 at 1:45 pm with NP. She understands and agrees.

## 2019-05-20 NOTE — Progress Notes (Unsigned)
[name]. [place] Female, 84 y.o., 12-31-1934 MRN:  [id number] Phone:  [phone] [name]) PCP:  Leanna Battles, MD Coverage:  [facility] ([facility]) 05/26/2019 at 11:00 AM  RE: CT Biopsy Received: Today Message Contents  Markus Daft, MD  Arlyn Leak for CT guided left lung lesion biopsy.   [facility]   Previous Messages  ----- Message -----  From: Garth Bigness D  Sent: 05/20/2019  9:36 AM EDT  To: Markus Daft, MD  Subject: FW: CT Biopsy                   PET scan done  ----- Message -----  From: Markus Daft, MD  Sent: 05/14/2019 12:12 PM EDT  To: [name]  Subject: RE: CT Biopsy                   I discussed with Dr. Benay Spice. Can we schedule after PET CT.     [facility]  ----- Message -----  From: Garth Bigness D  Sent: 05/14/2019 11:58 AM EDT  To: [facility] Procedure Requests  Subject: CT Biopsy                     Procedure:  CT Biopsy   Reason:  lung mass, bone lesions, left kidney lesion--please biopsy left lung mass   History: CT in computer, outside imaging uploaded   Dr. Ladell Pier   [phone]

## 2019-05-21 ENCOUNTER — Encounter: Payer: Self-pay | Admitting: Nurse Practitioner

## 2019-05-22 ENCOUNTER — Ambulatory Visit (HOSPITAL_COMMUNITY)
Admission: RE | Admit: 2019-05-22 | Discharge: 2019-05-22 | Disposition: A | Discharge status: Home / Self Care | Payer: Medicare Other | Source: Ambulatory Visit | Attending: Nurse Practitioner | Admitting: Nurse Practitioner

## 2019-05-22 DIAGNOSIS — Z01812 Encounter for preprocedural laboratory examination: Secondary | ICD-10-CM | POA: Insufficient documentation

## 2019-05-22 DIAGNOSIS — Z20828 Contact with and (suspected) exposure to other viral communicable diseases: Secondary | ICD-10-CM | POA: Diagnosis not present

## 2019-05-23 ENCOUNTER — Telehealth: Payer: Self-pay | Admitting: Nurse Practitioner

## 2019-05-23 ENCOUNTER — Other Ambulatory Visit: Payer: Self-pay | Admitting: Physician Assistant

## 2019-05-23 NOTE — Telephone Encounter (Signed)
I contacted [name] with the PET scan results from 05/19/2019.  She is scheduled for a biopsy on 05/26/2019.  We will see her as scheduled on 05/29/2019.

## 2019-05-24 LAB — NOVEL CORONAVIRUS, NAA (HOSP ORDER, SEND-OUT TO REF LAB; TAT 18-24 HRS): SARS-CoV-2, NAA: NOT DETECTED

## 2019-05-26 ENCOUNTER — Ambulatory Visit: Payer: Medicare Other | Admitting: Nurse Practitioner

## 2019-05-26 ENCOUNTER — Encounter (HOSPITAL_COMMUNITY): Payer: Self-pay

## 2019-05-26 ENCOUNTER — Other Ambulatory Visit: Payer: Self-pay | Admitting: Radiology

## 2019-05-26 ENCOUNTER — Ambulatory Visit (HOSPITAL_COMMUNITY)
Admission: RE | Admit: 2019-05-26 | Discharge: 2019-05-26 | Disposition: A | Discharge status: Home / Self Care | Payer: Medicare Other | Source: Ambulatory Visit | Attending: Nurse Practitioner | Admitting: Nurse Practitioner

## 2019-05-26 ENCOUNTER — Other Ambulatory Visit: Payer: Self-pay

## 2019-05-26 DIAGNOSIS — E785 Hyperlipidemia, unspecified: Secondary | ICD-10-CM | POA: Insufficient documentation

## 2019-05-26 DIAGNOSIS — C3432 Malignant neoplasm of lower lobe, left bronchus or lung: Secondary | ICD-10-CM | POA: Insufficient documentation

## 2019-05-26 DIAGNOSIS — Z7901 Long term (current) use of anticoagulants: Secondary | ICD-10-CM | POA: Diagnosis not present

## 2019-05-26 DIAGNOSIS — Z7984 Long term (current) use of oral hypoglycemic drugs: Secondary | ICD-10-CM | POA: Insufficient documentation

## 2019-05-26 DIAGNOSIS — Z87891 Personal history of nicotine dependence: Secondary | ICD-10-CM | POA: Diagnosis not present

## 2019-05-26 DIAGNOSIS — M810 Age-related osteoporosis without current pathological fracture: Secondary | ICD-10-CM | POA: Diagnosis not present

## 2019-05-26 DIAGNOSIS — E119 Type 2 diabetes mellitus without complications: Secondary | ICD-10-CM | POA: Insufficient documentation

## 2019-05-26 DIAGNOSIS — I10 Essential (primary) hypertension: Secondary | ICD-10-CM | POA: Diagnosis not present

## 2019-05-26 DIAGNOSIS — R918 Other nonspecific abnormal finding of lung field: Secondary | ICD-10-CM

## 2019-05-26 DIAGNOSIS — R911 Solitary pulmonary nodule: Secondary | ICD-10-CM | POA: Insufficient documentation

## 2019-05-26 DIAGNOSIS — I2510 Atherosclerotic heart disease of native coronary artery without angina pectoris: Secondary | ICD-10-CM | POA: Diagnosis not present

## 2019-05-26 DIAGNOSIS — M899 Disorder of bone, unspecified: Secondary | ICD-10-CM | POA: Insufficient documentation

## 2019-05-26 DIAGNOSIS — F419 Anxiety disorder, unspecified: Secondary | ICD-10-CM | POA: Diagnosis not present

## 2019-05-26 DIAGNOSIS — Z79899 Other long term (current) drug therapy: Secondary | ICD-10-CM | POA: Diagnosis not present

## 2019-05-26 LAB — CBC
HCT: 36.6 % (ref 36.0–46.0)
Hemoglobin: 11.8 g/dL — ABNORMAL LOW (ref 12.0–15.0)
MCH: 30.8 pg (ref 26.0–34.0)
MCHC: 32.2 g/dL (ref 30.0–36.0)
MCV: 95.6 fL (ref 80.0–100.0)
Platelets: 248 10*3/uL (ref 150–400)
RBC: 3.83 MIL/uL — ABNORMAL LOW (ref 3.87–5.11)
RDW: 12 % (ref 11.5–15.5)
WBC: 5.5 10*3/uL (ref 4.0–10.5)
nRBC: 0 % (ref 0.0–0.2)

## 2019-05-26 LAB — PROTIME-INR
INR: 1.1 (ref 0.8–1.2)
Prothrombin Time: 13.6 seconds (ref 11.4–15.2)

## 2019-05-26 LAB — GLUCOSE, CAPILLARY: Glucose-Capillary: 110 mg/dL — ABNORMAL HIGH (ref 70–99)

## 2019-05-26 MED ORDER — MIDAZOLAM HCL 2 MG/2ML IJ SOLN
INTRAMUSCULAR | Status: AC | PRN
  Administered 2019-05-26: 1 mg via INTRAVENOUS

## 2019-05-26 MED ORDER — MIDAZOLAM HCL 2 MG/2ML IJ SOLN
INTRAMUSCULAR | Status: AC
  Filled 2019-05-26: qty 2

## 2019-05-26 MED ORDER — FENTANYL CITRATE (PF) 100 MCG/2ML IJ SOLN
INTRAMUSCULAR | Status: AC | PRN
  Administered 2019-05-26: 25 ug via INTRAVENOUS

## 2019-05-26 MED ORDER — SODIUM CHLORIDE 0.9 % IV SOLN: INTRAVENOUS | Status: DC

## 2019-05-26 MED ORDER — FENTANYL CITRATE (PF) 100 MCG/2ML IJ SOLN
INTRAMUSCULAR | Status: AC
  Filled 2019-05-26: qty 2

## 2019-05-26 MED ORDER — LIDOCAINE HCL 1 % IJ SOLN
INTRAMUSCULAR | Status: AC
  Filled 2019-05-26: qty 20

## 2019-05-26 NOTE — Procedures (Signed)
  Procedure: CT core biopsy LLL lung nodule   EBL:   minimal Complications:  none immediate  See full dictation in [facility].  Dillard Cannon MD Main # [phone] Pager  [phone]

## 2019-05-26 NOTE — Discharge Instructions (Signed)
Lung Biopsy, Care After °This sheet gives you information about how to care for yourself after your procedure. Your health care provider may also give you more specific instructions depending on the type of biopsy you had. If you have problems or questions, contact your health care provider. °What can I expect after the procedure? °After the procedure, it is common to have: °· A cough. °· A sore throat. °· Pain where a needle, bronchoscope, or incision was used to collect a biopsy sample (biopsy site). °Follow these instructions at home: °Medicines °· Take over-the-counter and prescription medicines only as told by your health care provider. °· Do not drink alcohol if your health care provider tells you not to drink. °· Ask your health care provider if the medicine prescribed to you: °? Requires you to avoid driving or using heavy machinery. °? Can cause constipation. You may need to take these actions to prevent or treat constipation: °§ Drink enough fluid to keep your urine pale yellow. °§ Take over-the-counter or prescription medicines. °§ Eat foods that are high in fiber, such as beans, whole grains, and fresh fruits and vegetables. °§ Limit foods that are high in fat and processed sugars, such as fried or sweet foods. °· Do not drive for 24 hours if you were given a sedative. °Biopsy site care ° °· Follow instructions from your health care provider about how to take care of your biopsy site. Make sure you: °? Wash your hands with soap and water before and after you change your bandage (dressing). If soap and water are not available, use hand sanitizer. °? Change your dressing as told by your health care provider. °? Leave stitches (sutures), skin glue, or adhesive strips in place. These skin closures may need to stay in place for 2 weeks or longer. If adhesive strip edges start to loosen and curl up, you may trim the loose edges. Do not remove adhesive strips completely unless your health care provider tells  you to do that. °· Do not take baths, swim, or use a hot tub until your health care provider approves. Ask your health care provider if you may take showers. You may only be allowed to take sponge baths. °· Check your biopsy site every day for signs of infection. Check for: °? Redness, swelling, or more pain. °? Fluid or blood. °? Warmth. °? Pus or a bad smell. °General instructions °· Return to your normal activities as told by your health care provider. Ask your health care provider what activities are safe for you. °· It is up to you to get the results of your procedure. Ask your health care provider, or the department that is doing the procedure, when your results will be ready. °· Keep all follow-up visits as told by your health care provider. This is important. °Contact a health care provider if: °· You have a fever. °· You have redness, swelling, or more pain around your biopsy site. °· You have fluid or blood coming from your biopsy site. °· Your biopsy site feels warm to the touch. °· You have pus or a bad smell coming from your biopsy site. °· You have pain that does not get better with medicine. °Get help right away if: °· You cough up blood. °· You have trouble breathing. °· You have chest pain. °· You lose consciousness. °Summary °· After the procedure, it is common to have a sore throat and a cough. °· Return to your normal activities as told by your   health care provider. Ask your health care provider what activities are safe for you. °· Take over-the-counter and prescription medicines only as told by your health care provider. °· Report any unusual symptoms to your health care provider. °This information is not intended to replace advice given to you by your health care provider. Make sure you discuss any questions you have with your health care provider. °Document Released: 08/15/2016 Document Revised: 08/21/2018 Document Reviewed: 08/15/2016 °Elsevier Patient Education © 2020 [employer] ° °

## 2019-05-26 NOTE — H&P (Signed)
Chief Complaint: Patient was seen in consultation today for left lung mass biopsy at the request of [place]  Referring Physician(s): Paterson,Daniel Dr Darrold Junker  Supervising Physician: Arne Cleveland  Patient Status: [name] - Out-pt  History of Present Illness: [name] is a 84 y.o. female   Complains of right hip pain Work up shows Bony lesions  1. Bone lesions on MRI  MRI lumbar spine 04/29/2019-enlarging marrow lesions involving the L1 vertebral body, upper left sacrum and right iliac bone  MRI pelvis 04/29/2019-3.5 cm left iliac bone lesion appears slightly larger; other similar appearing lesions present within the left superior pubic ramus, left superior abdomen acetabulum and upper left sacrum  Kappa free light chains with mild elevation 05/12/2019   CTs 05/12/2019- left lower lobe pulmonary mass 3.3 x 3.2 cm; lytic process left iliac bone; spinal lesions; 1.1 cm low-density left kidney lesion; right thyroid enlargement with heterogeneous appearance with potential for multiple discrete lesions  PET 05/19/19: IMPRESSION: 1. The left lower lobe mass measures 3.8 cm in long axis and has a maximum SUV of 3.8 favoring low-grade malignancy. Biopsy is recommended. 2. Previously identified bony lesions on MRI only have faintly accentuated activity, including the left iliac bone lesion and the lesion along the superior endplate of L1. These bony lesions remain Nonspecific.  Scheduled now for left lung mass biopsy  Past Medical History:  Diagnosis Date   Aneurysm ([place])    Right eye a non DES stent was placed so that she would not required long term dual antiplatelet therapy.   Anxiety    not currently taking any meds   Arthritis    CAD (coronary artery disease)    Diabetes mellitus without complication (HCC)    prediabetes per pt   H/O hiatal hernia    Heart murmur    History of stress test 03/2012   The post stress myocardial  perfusion images show a normal pattern of perfusion in all region. The post left ventricles is normal in size. There is no scintigraphic of inductible myocardial ischemia. The post EF is 67   Hx of echocardiogram 11/2010   Ef 67% Normal size chambes, Aortic valve sclerosis without stenosis, No other significant valvular abnormalities, No percardial effusion.   Hyperlipemia    Hypertension    Insulin resistance    Multiple thyroid nodules    Neuromuscular disorder (HCC)    nerve pain after shingles   Osteoporosis    Restless legs    Shingles     Past Surgical History:  Procedure Laterality Date   ABDOMINAL HYSTERECTOMY  1970   ANGIOPLASTY     Stenting of a 90% eccentric right coronary artery stenosis and had a 3.0x15 mm Integrity bare-metal stent inserted   ANTERIOR CERVICAL DECOMP/DISCECTOMY FUSION N/A 07/30/2013   Procedure: ANTERIOR CERVICAL DECOMPRESSION/DISCECTOMY FUSION CERVICAL FIVE -SIX;  Surgeon: Eustace Moore, MD;  Location: [facility];  Service: Neurosurgery;  Laterality: N/A;   APPENDECTOMY     BREAST SURGERY Left    cysts removed from left breast (in her 20'2)   CARDIAC CATHETERIZATION     Showed a widely patent stent, she did have 60% ostial diagonal-1 stenosis, She also had mild luminal irregularities of her LAD.   COLONOSCOPY     EYE SURGERY Bilateral 2009   cateract surgery- bilateral   REFRACTIVE SURGERY Left 2011   TONSILLECTOMY      Allergies: Codeine  Medications: Prior to Admission medications   Medication Sig Start Date End  Date Taking? Authorizing Provider  acetaminophen (TYLENOL) 500 MG tablet Take 1,000 mg by mouth 2 (two) times daily as needed (pain).   Yes [provider]  ALPRAZolam Duanne Moron) 0.5 MG tablet Take 1 tablet by mouth 2 (two) times daily as needed for anxiety.  09/11/13  Yes [provider]  diclofenac sodium (VOLTAREN) 1 % GEL Apply 1 application topically 3 (three) times daily as needed (pain).   Yes  [provider]  gabapentin (NEURONTIN) 300 MG capsule Take 300 mg by mouth at bedtime.    Yes [provider]  lisinopril (PRINIVIL,ZESTRIL) 10 MG tablet Take 1 tablet (10 mg total) by mouth daily. Patient taking differently: Take 10 mg by mouth at bedtime.  09/12/18 05/21/19 Yes Troy Sine, MD  Magnesium 250 MG TABS Take 500 mg by mouth at bedtime.   Yes [provider]  metFORMIN (GLUCOPHAGE-XR) 500 MG 24 hr tablet Take 500 mg by mouth at bedtime.  02/20/15  Yes [provider]  metoprolol tartrate (LOPRESSOR) 25 MG tablet Take 25 mg by mouth at bedtime.    Yes [provider]  Multiple Vitamins-Minerals (CENTRUM SILVER PO) Take 1 tablet by mouth daily.   Yes [provider]  naproxen sodium (ALEVE) 220 MG tablet Take 440 mg by mouth 2 (two) times daily as needed (pain).   Yes [provider]  polyethylene glycol (MIRALAX / GLYCOLAX) packet Take 17 g by mouth every morning.    Yes [provider]  rOPINIRole (REQUIP) 1 MG tablet Take 1 mg by mouth 2 (two) times daily. In the evening   Yes [provider]  rosuvastatin (CRESTOR) 40 MG tablet Take 40 mg by mouth at bedtime.    Yes [provider]  traMADol (ULTRAM) 50 MG tablet Take 50 mg by mouth every 6 (six) hours as needed for moderate pain.   Yes [provider]  traZODone (DESYREL) 100 MG tablet Take 100 mg by mouth at bedtime.   Yes [provider]  Vitamin D, Ergocalciferol, (DRISDOL) 50000 UNITS CAPS capsule Take 50,000 Units by mouth every 7 (seven) days.   Yes [provider]  zinc gluconate 50 MG tablet Take 50 mg by mouth daily.   Yes [provider]  nitroGLYCERIN (NITROSTAT) 0.4 MG SL tablet Place 0.4 mg under the tongue every 5 (five) minutes as needed for chest pain.    [provider]     Family History  Problem Relation Age of Onset   Alzheimer's disease Mother 59   Colon polyps Mother      Diabetes Mother    Heart attack Father 71   Cancer Maternal Grandmother 45   Breast cancer Maternal Grandmother    Diabetes Sister    Breast cancer Maternal Uncle    Breast cancer Maternal Aunt        x 2   Colon polyps Sister    Colon polyps Maternal Aunt    Colon cancer Neg Hx    Esophageal cancer Neg Hx    Rectal cancer Neg Hx    Stomach cancer Neg Hx     Social History   Socioeconomic History   Marital status: Married    Spouse name: Not on file   Number of children: Not on file   Years of education: Not on file   Highest education level: Not on file  Occupational History   Not on file  Social Needs   Financial resource strain: Not on file  Food insecurity    Worry: Not on file    Inability: Not on file   Transportation needs    Medical: Not on file    Non-medical: Not on file  Tobacco Use   Smoking status: Former Smoker    Packs/day: 2.00    Years: 33.00    Pack years: 66.00    Types: Cigarettes   Smokeless tobacco: Never Used   Tobacco comment: quit in 1995.  Substance and Sexual Activity   Alcohol use: Yes    Alcohol/week: 0.0 standard drinks    Comment: occasional (maybe twice a year)   Drug use: No   Sexual activity: Not on file  Lifestyle   Physical activity    Days per week: Not on file    Minutes per session: Not on file   Stress: Not on file  Relationships   Social connections    Talks on phone: Not on file    Gets together: Not on file    Attends religious service: Not on file    Active member of club or organization: Not on file    Attends meetings of clubs or organizations: Not on file    Relationship status: Not on file  Other Topics Concern   Not on file  Social History Narrative   Not on file    Review of Systems: A 12 point ROS discussed and pertinent positives are indicated in the HPI above.  All other systems are negative.  Review of Systems  Constitutional: Positive for activity change and  fatigue.  Respiratory: Negative for cough and shortness of breath.   Cardiovascular: Negative for chest pain.  Musculoskeletal: Positive for back pain and gait problem.  Neurological: Positive for weakness.  Psychiatric/Behavioral: Negative for behavioral problems and confusion.    Vital Signs: BP 116/60    Pulse (!) 55    Temp 98.7 F (37.1 C) (Oral)    Resp 16    Ht 5\' 4"  (1.626 m)    Wt 172 lb (78 kg)    SpO2 96%    BMI 29.52 kg/m   Physical Exam Vitals signs reviewed.  Constitutional:      Appearance: Normal appearance.  Cardiovascular:     Rate and Rhythm: Normal rate and regular rhythm.     Heart sounds: Normal heart sounds.  Pulmonary:     Effort: Pulmonary effort is normal.     Breath sounds: Normal breath sounds.  Abdominal:     Palpations: Abdomen is soft.     Tenderness: There is no abdominal tenderness.  Musculoskeletal: Normal range of motion.  Skin:    General: Skin is warm and dry.  Neurological:     Mental Status: She is alert and oriented to person, place, and time.  Psychiatric:        Mood and Affect: Mood normal.        Behavior: Behavior normal.        Thought Content: Thought content normal.        Judgment: Judgment normal.     Imaging: Ct Chest W Contrast  Result Date: 05/12/2019 CLINICAL DATA:  History of spinal cord and bone lesions EXAM: CT CHEST, ABDOMEN, AND PELVIS WITH CONTRAST TECHNIQUE: Multidetector CT imaging of the chest, abdomen and pelvis was performed following the standard protocol during bolus administration of intravenous contrast. CONTRAST:  121mL OMNIPAQUE IOHEXOL 300 MG/ML  SOLN COMPARISON:  MRI of the spine of 04/29/2019 and MRI of the pelvis 08/19/2018 FINDINGS: CT CHEST FINDINGS Cardiovascular:  Calcified atherosclerotic changes throughout the thoracic aorta. Heart size mildly enlarged without pericardial effusion and signs of coronary artery disease. Mediastinum/Nodes: At the thoracic inlet there is enlargement of the right  hemi thyroid with either a large confluent lesion or multiple small lesions some which measure as large as 1 point 21.3 cm. Lungs/Pleura: Lobulated solid mass lesion in the left lung base measures 3.3 by 3.2 cm in greatest axial dimension. This is not associated with pleural nodularity or pleural fluid but does extend to the pleural surface in the left posterior chest. Bibasilar atelectasis.  Airways are patent. Musculoskeletal: No chest wall mass or suspicious bone lesions identified. CT ABDOMEN PELVIS FINDINGS Hepatobiliary: No focal liver abnormality is seen. No gallstones, gallbladder wall thickening, or biliary dilatation. Pancreas: Unremarkable. No pancreatic ductal dilatation or surrounding inflammatory changes. Spleen: Normal in size without focal abnormality. Adrenals/Urinary Tract: Normal bilateral adrenal glands. No signs of hydronephrosis. Indeterminate low-density renal lesion on the left measures 1.1 cm seen in the posterior hilar lip, density approximately 37 Hounsfield units. Stomach/Bowel: w small hiatal hernia. No signs of bowel obstruction or acute bowel process. Vascular/Lymphatic: Moderate calcific atherosclerotic change throughout the abdominal aorta. No signs of aneurysm or acute intra-abdominal vascular finding. No signs of upper abdominal or pelvic lymphadenopathy. Reproductive: Post hysterectomy. Other: No abdominal wall hernia or abnormality. No abdominopelvic ascites. Musculoskeletal: Lytic process in the left iliac bone better seen on recent MRI. Spinal lesions also better displayed on previous MRI evaluations. No signs of acute fracture. Subtle sclerosis beneath an area of endplate deformity that has an appearance of Schmorl's node in the upper margin of the L1 vertebral body. Better displayed on MRI evaluation of 04/29/2019 Along the margin of the left scapula is a 6 mm area of lucency. IMPRESSION: Left lower lobe pulmonary mass suspicious for either primary or metastatic process. Bone  lesions raising the question of metastatic disease as well. Right thyroidal enlargement with heterogeneous appearance with potential for multiple discrete lesions, consider thyroid ultrasound for further assessment. Indeterminate left renal lesions may represent small hemorrhagic cysts, sonogram could be performed for further assessment as warranted. Electronically Signed   By: Zetta Bills M.D.   On: 05/12/2019 16:44   Ct Abdomen Pelvis W Contrast  Result Date: 05/12/2019 CLINICAL DATA:  History of spinal cord and bone lesions EXAM: CT CHEST, ABDOMEN, AND PELVIS WITH CONTRAST TECHNIQUE: Multidetector CT imaging of the chest, abdomen and pelvis was performed following the standard protocol during bolus administration of intravenous contrast. CONTRAST:  173mL OMNIPAQUE IOHEXOL 300 MG/ML  SOLN COMPARISON:  MRI of the spine of 04/29/2019 and MRI of the pelvis 08/19/2018 FINDINGS: CT CHEST FINDINGS Cardiovascular: Calcified atherosclerotic changes throughout the thoracic aorta. Heart size mildly enlarged without pericardial effusion and signs of coronary artery disease. Mediastinum/Nodes: At the thoracic inlet there is enlargement of the right hemi thyroid with either a large confluent lesion or multiple small lesions some which measure as large as 1 point 21.3 cm. Lungs/Pleura: Lobulated solid mass lesion in the left lung base measures 3.3 by 3.2 cm in greatest axial dimension. This is not associated with pleural nodularity or pleural fluid but does extend to the pleural surface in the left posterior chest. Bibasilar atelectasis.  Airways are patent. Musculoskeletal: No chest wall mass or suspicious bone lesions identified. CT ABDOMEN PELVIS FINDINGS Hepatobiliary: No focal liver abnormality is seen. No gallstones, gallbladder wall thickening, or biliary dilatation. Pancreas: Unremarkable. No pancreatic ductal dilatation or surrounding inflammatory changes. Spleen: Normal in size without  focal abnormality.  Adrenals/Urinary Tract: Normal bilateral adrenal glands. No signs of hydronephrosis. Indeterminate low-density renal lesion on the left measures 1.1 cm seen in the posterior hilar lip, density approximately 37 Hounsfield units. Stomach/Bowel: w small hiatal hernia. No signs of bowel obstruction or acute bowel process. Vascular/Lymphatic: Moderate calcific atherosclerotic change throughout the abdominal aorta. No signs of aneurysm or acute intra-abdominal vascular finding. No signs of upper abdominal or pelvic lymphadenopathy. Reproductive: Post hysterectomy. Other: No abdominal wall hernia or abnormality. No abdominopelvic ascites. Musculoskeletal: Lytic process in the left iliac bone better seen on recent MRI. Spinal lesions also better displayed on previous MRI evaluations. No signs of acute fracture. Subtle sclerosis beneath an area of endplate deformity that has an appearance of Schmorl's node in the upper margin of the L1 vertebral body. Better displayed on MRI evaluation of 04/29/2019 Along the margin of the left scapula is a 6 mm area of lucency. IMPRESSION: Left lower lobe pulmonary mass suspicious for either primary or metastatic process. Bone lesions raising the question of metastatic disease as well. Right thyroidal enlargement with heterogeneous appearance with potential for multiple discrete lesions, consider thyroid ultrasound for further assessment. Indeterminate left renal lesions may represent small hemorrhagic cysts, sonogram could be performed for further assessment as warranted. Electronically Signed   By: Zetta Bills M.D.   On: 05/12/2019 16:44   Nm Pet Image Initial (pi) Skull Base To Thigh  Result Date: 05/19/2019 CLINICAL DATA:  Initial treatment strategy for left lower lobe lung mass, also with bone lesions. EXAM: NUCLEAR MEDICINE PET SKULL BASE TO THIGH TECHNIQUE: 8.5 mCi F-18 FDG was injected intravenously. Full-ring PET imaging was performed from the skull base to thigh after the  radiotracer. CT data was obtained and used for attenuation correction and anatomic localization. Fasting blood glucose: 97 mg/dl COMPARISON:  CT scan 05/12/2019 FINDINGS: Mediastinal blood pool activity: SUV max 2.8 Liver activity: SUV max 3.7 NECK: No hypermetabolic thyroid activity. No significant abnormal hypermetabolic activity in this region. Incidental CT findings: None CHEST: The 3.8 by 2.7 cm left lower lobe mass has a maximum SUV of 3.8, favoring low-grade malignancy. No hypermetabolic adenopathy in the chest. Incidental CT findings: Coronary, aortic arch, and branch vessel atherosclerotic vascular disease. ABDOMEN/PELVIS: No significant abnormal hypermetabolic activity in this region. Incidental CT findings: Aortoiliac atherosclerotic vascular disease. Sigmoid colon diverticulosis. SKELETON: Along the broad Schmorl's node or lesion along the superior endplate of L1 there is some mildly accentuated metabolic activity, maximum SUV 3.4. There is only low-grade activity in the left iliac bone corresponding to the lesion seen on MRI 08/19/2018, maximum SUV 2.7. Contralateral similar location has a maximum SUV of 1.1. The small lesion in the upper margin of the S1 vertebra eccentric to the left is not well seen. A small lateral lucency along the left scapula shown on prior CT is visible on image 100/3 and likewise has very faintly accentuated metabolic activity compared to the contralateral side, maximum SUV 2.1, contralateral side 1.2. Incidental CT findings: None IMPRESSION: 1. The left lower lobe mass measures 3.8 cm in long axis and has a maximum SUV of 3.8 favoring low-grade malignancy. Biopsy is recommended. 2. Previously identified bony lesions on MRI only have faintly accentuated activity, including the left iliac bone lesion and the lesion along the superior endplate of L1. These bony lesions remain nonspecific. 3. No abnormal thyroid activity. Typically this indicates that the thyroid nodules are  benign. 4. Other imaging findings of potential clinical significance: Aortic Atherosclerosis (ICD10-I70.0). Coronary  atherosclerosis. Sigmoid colon diverticulosis. Electronically Signed   By: Van Clines M.D.   On: 05/19/2019 14:49    Labs:  CBC: Recent Labs    05/09/19 1045  WBC 6.0  HGB 13.1  HCT 39.7  PLT 180    COAGS: No results for input(s): INR, APTT in the last 8760 hours.  BMP: Recent Labs    05/09/19 1045  NA 137  K 4.7  CL 103  CO2 27  GLUCOSE 110*  BUN 23  CALCIUM 10.0  CREATININE 0.87  GFRNONAA >60  GFRAA >60    LIVER FUNCTION TESTS: Recent Labs    05/09/19 1045  BILITOT 0.4  AST 24  ALT 18  ALKPHOS 76  PROT 7.6  ALBUMIN 4.5    TUMOR MARKERS: No results for input(s): AFPTM, CEA, CA199, CHROMGRNA in the last 8760 hours.  Assessment and Plan:  Right hip pain Work up revealing bony lesions Further imaging and PET reveals +Left lung mass Scheduled for left lung mass bx Risks and benefits of CT guided lung nodule biopsy was discussed with the patient including, but not limited to bleeding, hemoptysis, respiratory failure requiring intubation, infection, pneumothorax requiring chest tube placement, stroke from air embolism or even death.  All of the patient's questions were answered and the patient is agreeable to proceed.  Consent signed and in chart.   Thank you for this interesting consult.  I greatly enjoyed meeting [name] and look forward to participating in their care.  A copy of this report was sent to the requesting provider on this date.  Electronically Signed: Lavonia Drafts, PA-C 05/26/2019, 9:58 AM   I spent a total of  30 Minutes   in face to face in clinical consultation, greater than 50% of which was counseling/coordinating care for left lung mass bx

## 2019-05-29 ENCOUNTER — Inpatient Hospital Stay (HOSPITAL_BASED_OUTPATIENT_CLINIC_OR_DEPARTMENT_OTHER): Payer: Medicare Other | Admitting: Nurse Practitioner

## 2019-05-29 ENCOUNTER — Encounter: Payer: Self-pay | Admitting: *Deleted

## 2019-05-29 ENCOUNTER — Encounter: Payer: Self-pay | Admitting: Nurse Practitioner

## 2019-05-29 ENCOUNTER — Other Ambulatory Visit: Payer: Self-pay

## 2019-05-29 ENCOUNTER — Telehealth: Payer: Self-pay | Admitting: Nurse Practitioner

## 2019-05-29 VITALS — BP 145/73 | HR 64 | Temp 97.8°F | Resp 17 | Ht 64.0 in | Wt 173.7 lb

## 2019-05-29 DIAGNOSIS — R918 Other nonspecific abnormal finding of lung field: Secondary | ICD-10-CM

## 2019-05-29 DIAGNOSIS — Z23 Encounter for immunization: Secondary | ICD-10-CM

## 2019-05-29 DIAGNOSIS — M899 Disorder of bone, unspecified: Secondary | ICD-10-CM | POA: Diagnosis not present

## 2019-05-29 DIAGNOSIS — C3492 Malignant neoplasm of unspecified part of left bronchus or lung: Secondary | ICD-10-CM | POA: Insufficient documentation

## 2019-05-29 MED ORDER — CYANOCOBALAMIN 1000 MCG/ML IJ SOLN
INTRAMUSCULAR | Status: AC
  Filled 2019-05-29: qty 1

## 2019-05-29 MED ORDER — PNEUMOCOCCAL 13-VAL CONJ VACC IM SUSP
0.5000 mL | Freq: Once | INTRAMUSCULAR | Status: AC
  Administered 2019-05-29: 0.5 mL via INTRAMUSCULAR
  Filled 2019-05-29: qty 0.5

## 2019-05-29 MED ORDER — FOLIC ACID 1 MG PO TABS: 1.0000 mg | ORAL_TABLET | Freq: Every day | ORAL | 3 refills | Status: DC

## 2019-05-29 MED ORDER — PROCHLORPERAZINE MALEATE 10 MG PO TABS: 10.0000 mg | ORAL_TABLET | Freq: Four times a day (QID) | ORAL | 1 refills | Status: DC | PRN

## 2019-05-29 MED ORDER — CYANOCOBALAMIN 1000 MCG/ML IJ SOLN
1000.0000 ug | Freq: Once | INTRAMUSCULAR | Status: AC
  Administered 2019-05-29: 15:00:00 1000 ug via SUBCUTANEOUS

## 2019-05-29 NOTE — Telephone Encounter (Signed)
Scheduled per los. Gave avs and calendar  

## 2019-05-29 NOTE — Progress Notes (Signed)
START OFF PATHWAY REGIMEN - Non-Small Cell Lung   [id number]:Pembrolizumab 200 mg  IV D1 + Pemetrexed 500 mg/m2 IV D1 + Carboplatin AUC=5 IV D1 q21 Days:   A cycle is every 21 days:     Pembrolizumab      Pemetrexed      Carboplatin   **Always confirm dose/schedule in your pharmacy ordering system**  Patient Characteristics: Stage IV Metastatic, Nonsquamous, Initial Chemotherapy/Immunotherapy, PS = 0, 1, ALK or EGFR or ROS1 or NTRK or MET or RET Genomic Alterations - Awaiting Test Results AJCC T Category: Staged < 8th Ed. Current Disease Status: Distant Metastases AJCC N Category: Staged < 8th Ed. AJCC M Category: Staged < 8th Ed. AJCC 8 Stage Grouping: Staged < 8th Ed. Histology: Nonsquamous Cell ROS1 Rearrangement Status: Awaiting Test Results T790M Mutation Status: Not Applicable - EGFR Mutation Negative/Unknown Other Mutations/Biomarkers: No Other Actionable Mutations Chemotherapy/Immunotherapy LOT: Initial Chemotherapy/Immunotherapy Molecular Targeted Therapy: Not Appropriate MET Exon 14 Mutation Status: Awaiting Test Results RET Gene Fusion Status: Awaiting Test Results EGFR Mutation Status: Awaiting Test Results NTRK Gene Fusion Status: Awaiting Test Results PD-L1 Expression Status: Awaiting Test Results ALK Rearrangement Status: Awaiting Test Results BRAF V600E Mutation Status: Awaiting Test Results ECOG Performance Status: 1 Intent of Therapy: Non-Curative / Palliative Intent, Discussed with Patient

## 2019-05-29 NOTE — Progress Notes (Addendum)
[facility] OFFICE PROGRESS NOTE   Diagnosis: Bone lesions, lung mass  INTERVAL HISTORY:   [name] returns as scheduled.  She underwent biopsy of the left lower lobe pleural-based nodule 05/26/2019.  She continues to have low back and left hip/groin pain.  She takes Tylenol as needed.  She tried tramadol on one occasion.  Bowels are moving.  She takes MiraLAX.  Mild dyspnea on exertion.  Objective:  Vital signs in last 24 hours:  Blood pressure (!) 145/73, pulse 64, temperature 97.8 F (36.6 C), temperature source Temporal, resp. rate 17, height 5' 4" (1.626 m), weight 173 lb 11.2 oz (78.8 kg), SpO2 97 %.   Resp: Rub left mid to lower lung field.  No respiratory distress. GI: Abdomen soft and nontender.  No hepatomegaly. Vascular: No leg edema. Neuro: Alert and oriented.   Lab Results:  Lab Results  Component Value Date   WBC 5.5 05/26/2019   HGB 11.8 (L) 05/26/2019   HCT 36.6 05/26/2019   MCV 95.6 05/26/2019   PLT 248 05/26/2019   NEUTROABS 3.9 05/09/2019    Imaging:  No results found.  Medications: I have reviewed the patient's current medications.  Assessment/Plan: 1. Bone lesions on MRI  MRI lumbar spine 04/29/2019-enlarging marrow lesions involving the L1 vertebral body, upper left sacrum and right iliac bone  MRI pelvis 04/29/2019-3.5 cm left iliac bone lesion appears slightly larger; other similar appearing lesions present within the left superior pubic ramus, left superior abdomen acetabulum and upper left sacrum  Kappa free light chains with mild elevation 05/12/2019   CTs 05/12/2019- left lower lobe pulmonary mass 3.3 x 3.2 cm; lytic process left iliac bone; spinal lesions; 1.1 cm low-density left kidney lesion; right thyroid enlargement with heterogeneous appearance with potential for multiple discrete lesions  Biopsy left lower lobe lung mass 05/26/2019 2. Pain secondary to #1 3. Chronic back pain 4. Type 2 diabetes 5.  Essential hypertension 6. CAD 7. Hyperlipidemia 8. Family history significant for multiple members with breast cancer  Disposition: [name] appears stable.  She underwent biopsy of the left lower lobe lung mass earlier this week.  Preliminary pathology indicates adenosquamous, lung primary.  We reviewed this with her at today's visit.  She understands that no therapy will be curative. Dr. Sherrill recommends treatment with carboplatin/Alimta plus pembrolizumab.  We have requested EGFR and ALK testing.  We reviewed potential toxicities associated with chemotherapy including bone marrow toxicity, nausea, hair loss, allergic reaction, mouth sores, diarrhea, rash.  She will begin folic acid 1 mg daily.  She will receive a B12 injection today.  She understands the rationale for both of these.  We reviewed potential toxicities associated with immunotherapy including rash, diarrhea, endocrinopathies, pneumonitis, hepatitis.  She agrees to proceed.  She is interested in a Port-A-Cath.  We made a referral for Port-A-Cath placement.  Prescriptions sent to her pharmacy for folic acid and Compazine.  She will attend a chemotherapy education class.  She will receive the Prevnar vaccine today.  She will return for lab, follow-up, cycle 1 carboplatin/Alimta/pembrolizumab 06/06/2019.  She will contact the office in the interim with any problems.  Patient seen with Dr. Sherrill.  25 minutes were spent face-to-face at today's visit with the majority that time involved in counseling/coordination of care.    [name] ANP/GNP-BC   05/29/2019  2:09 PM This was a shared visit with [name].  Lung biopsy and her husband.  He is consistently adenosquamous cell carcinoma of lung primary.    She has been diagnosed with stage IV non-small cell lung cancer.  She understands no therapy will be curative.  We discussed treatment options.  Static therapy with Alimta/carboplatin and pembrolizumab.  Submitted tissue for  NGS and PD1 testing.  Reviewed potential toxicities associated with the Alimta/carboplatin regimen.  We also discussed toxicities associated with pembrolizumab.  [name] will attend a chemotherapy teaching class.  She would like to have a Port-A-Cath placed treatment.  Julieanne Manson, MD

## 2019-05-29 NOTE — Patient Instructions (Addendum)
Pembrolizumab injection What is this medicine? PEMBROLIZUMAB (pem broe liz ue mab) is a monoclonal antibody. It is used to treat bladder cancer, cervical cancer, endometrial cancer, esophageal cancer, head and neck cancer, hepatocellular cancer, Hodgkin lymphoma, kidney cancer, lymphoma, melanoma, Merkel cell carcinoma, lung cancer, stomach cancer, urothelial cancer, and cancers that have a certain genetic condition. This medicine may be used for other purposes; ask your health care provider or pharmacist if you have questions. COMMON BRAND NAME(S): Keytruda What should I tell my health care provider before I take this medicine? They need to know if you have any of these conditions:  diabetes  immune system problems  inflammatory bowel disease  liver disease  lung or breathing disease  lupus  received or scheduled to receive an organ transplant or a stem-cell transplant that uses donor stem cells  an unusual or allergic reaction to pembrolizumab, other medicines, foods, dyes, or preservatives  pregnant or trying to get pregnant  breast-feeding How should I use this medicine? This medicine is for infusion into a vein. It is given by a health care professional in a hospital or clinic setting. A special MedGuide will be given to you before each treatment. Be sure to read this information carefully each time. Talk to your pediatrician regarding the use of this medicine in children. While this drug may be prescribed for selected conditions, precautions do apply. Overdosage: If you think you have taken too much of this medicine contact a poison control center or emergency room at once. NOTE: This medicine is only for you. Do not share this medicine with others. What if I miss a dose? It is important not to miss your dose. Call your doctor or health care professional if you are unable to keep an appointment. What may interact with this medicine? Interactions have not been studied. Give  your health care provider a list of all the medicines, herbs, non-prescription drugs, or dietary supplements you use. Also tell them if you smoke, drink alcohol, or use illegal drugs. Some items may interact with your medicine. This list may not describe all possible interactions. Give your health care provider a list of all the medicines, herbs, non-prescription drugs, or dietary supplements you use. Also tell them if you smoke, drink alcohol, or use illegal drugs. Some items may interact with your medicine. What should I watch for while using this medicine? Your condition will be monitored carefully while you are receiving this medicine. You may need blood work done while you are taking this medicine. Do not become pregnant while taking this medicine or for 4 months after stopping it. Women should inform their doctor if they wish to become pregnant or think they might be pregnant. There is a potential for serious side effects to an unborn child. Talk to your health care professional or pharmacist for more information. Do not breast-feed an infant while taking this medicine or for 4 months after the last dose. What side effects may I notice from receiving this medicine? Side effects that you should report to your doctor or health care professional as soon as possible:  allergic reactions like skin rash, itching or hives, swelling of the face, lips, or tongue  bloody or black, tarry  breathing problems  changes in vision  chest pain  chills  confusion  constipation  cough  diarrhea  dizziness or feeling faint or lightheaded  fast or irregular heartbeat  fever  flushing  hair loss  joint pain  low blood counts - this  medicine may decrease the number of white blood cells, red blood cells and platelets. You may be at increased risk for infections and bleeding.  muscle pain  muscle weakness  persistent headache  redness, blistering, peeling or loosening of the skin,  including inside the mouth  signs and symptoms of high blood sugar such as dizziness; dry mouth; dry skin; fruity breath; nausea; stomach pain; increased hunger or thirst; increased urination  signs and symptoms of kidney injury like trouble passing urine or change in the amount of urine  signs and symptoms of liver injury like dark urine, light-colored stools, loss of appetite, nausea, right upper belly pain, yellowing of the eyes or skin  sweating  swollen lymph nodes  weight loss Side effects that usually do not require medical attention (report to your doctor or health care professional if they continue or are bothersome):  decreased appetite  muscle pain  tiredness This list may not describe all possible side effects. Call your doctor for medical advice about side effects. You may report side effects to FDA at 1-800-FDA-1088. Where should I keep my medicine? This drug is given in a hospital or clinic and will not be stored at home. NOTE: This sheet is a summary. It may not cover all possible information. If you have questions about this medicine, talk to your doctor, pharmacist, or health care provider.  2020 Elsevier/Gold Standard (2018-08-13 13:46:58) Pemetrexed injection What is this medicine? PEMETREXED (PEM e TREX ed) is a chemotherapy drug used to treat lung cancers like non-small cell lung cancer and mesothelioma. It may also be used to treat other cancers. This medicine may be used for other purposes; ask your health care provider or pharmacist if you have questions. COMMON BRAND NAME(S): Alimta What should I tell my health care provider before I take this medicine? They need to know if you have any of these conditions:  infection (especially a virus infection such as chickenpox, cold sores, or herpes)  kidney disease  low blood counts, like low white cell, platelet, or red cell counts  lung or breathing disease, like asthma  radiation therapy  an unusual or  allergic reaction to pemetrexed, other medicines, foods, dyes, or preservative  pregnant or trying to get pregnant  breast-feeding How should I use this medicine? This drug is given as an infusion into a vein. It is administered in a hospital or clinic by a specially trained health care professional. Talk to your pediatrician regarding the use of this medicine in children. Special care may be needed. Overdosage: If you think you have taken too much of this medicine contact a poison control center or emergency room at once. NOTE: This medicine is only for you. Do not share this medicine with others. What if I miss a dose? It is important not to miss your dose. Call your doctor or health care professional if you are unable to keep an appointment. What may interact with this medicine? This medicine may interact with the following medications:  Ibuprofen This list may not describe all possible interactions. Give your health care provider a list of all the medicines, herbs, non-prescription drugs, or dietary supplements you use. Also tell them if you smoke, drink alcohol, or use illegal drugs. Some items may interact with your medicine. What should I watch for while using this medicine? Visit your doctor for checks on your progress. This drug may make you feel generally unwell. This is not uncommon, as chemotherapy can affect healthy cells as well as  cancer cells. Report any side effects. Continue your course of treatment even though you feel ill unless your doctor tells you to stop. In some cases, you may be given additional medicines to help with side effects. Follow all directions for their use. Call your doctor or health care professional for advice if you get a fever, chills or sore throat, or other symptoms of a cold or flu. Do not treat yourself. This drug decreases your body's ability to fight infections. Try to avoid being around people who are sick. This medicine may increase your risk to  bruise or bleed. Call your doctor or health care professional if you notice any unusual bleeding. Be careful brushing and flossing your teeth or using a toothpick because you may get an infection or bleed more easily. If you have any dental work done, tell your dentist you are receiving this medicine. Avoid taking products that contain aspirin, acetaminophen, ibuprofen, naproxen, or ketoprofen unless instructed by your doctor. These medicines may hide a fever. Call your doctor or health care professional if you get diarrhea or mouth sores. Do not treat yourself. To protect your kidneys, drink water or other fluids as directed while you are taking this medicine. Do not become pregnant while taking this medicine or for 6 months after stopping it. Women should inform their doctor if they wish to become pregnant or think they might be pregnant. Men should not father a child while taking this medicine and for 3 months after stopping it. This may interfere with the ability to father a child. You should talk to your doctor or health care professional if you are concerned about your fertility. There is a potential for serious side effects to an unborn child. Talk to your health care professional or pharmacist for more information. Do not breast-feed an infant while taking this medicine or for 1 week after stopping it. What side effects may I notice from receiving this medicine? Side effects that you should report to your doctor or health care professional as soon as possible:  allergic reactions like skin rash, itching or hives, swelling of the face, lips, or tongue  breathing problems  redness, blistering, peeling or loosening of the skin, including inside the mouth  signs and symptoms of bleeding such as bloody or black, tarry stools; red or dark-brown urine; spitting up blood or brown material that looks like coffee grounds; red spots on the skin; unusual bruising or bleeding from the eye, gums, or  nose  signs and symptoms of infection like fever or chills; cough; sore throat; pain or trouble passing urine  signs and symptoms of kidney injury like trouble passing urine or change in the amount of urine  signs and symptoms of liver injury like dark yellow or brown urine; general ill feeling or flu-like symptoms; light-colored stools; loss of appetite; nausea; right upper belly pain; unusually weak or tired; yellowing of the eyes or skin Side effects that usually do not require medical attention (report to your doctor or health care professional if they continue or are bothersome):  constipation  mouth sores  nausea, vomiting  unusually weak or tired This list may not describe all possible side effects. Call your doctor for medical advice about side effects. You may report side effects to FDA at 1-800-FDA-1088. Where should I keep my medicine? This drug is given in a hospital or clinic and will not be stored at home. NOTE: This sheet is a summary. It may not cover all possible information. If  you have questions about this medicine, talk to your doctor, pharmacist, or health care provider.  2020 Elsevier/Gold Standard (2017-09-05 16:11:33) Carboplatin injection What is this medicine? CARBOPLATIN (KAR boe pla tin) is a chemotherapy drug. It targets fast dividing cells, like cancer cells, and causes these cells to die. This medicine is used to treat ovarian cancer and many other cancers. This medicine may be used for other purposes; ask your health care provider or pharmacist if you have questions. COMMON BRAND NAME(S): Paraplatin What should I tell my health care provider before I take this medicine? They need to know if you have any of these conditions:  blood disorders  hearing problems  kidney disease  recent or ongoing radiation therapy  an unusual or allergic reaction to carboplatin, cisplatin, other chemotherapy, other medicines, foods, dyes, or preservatives  pregnant  or trying to get pregnant  breast-feeding How should I use this medicine? This drug is usually given as an infusion into a vein. It is administered in a hospital or clinic by a specially trained health care professional. Talk to your pediatrician regarding the use of this medicine in children. Special care may be needed. Overdosage: If you think you have taken too much of this medicine contact a poison control center or emergency room at once. NOTE: This medicine is only for you. Do not share this medicine with others. What if I miss a dose? It is important not to miss a dose. Call your doctor or health care professional if you are unable to keep an appointment. What may interact with this medicine?  medicines for seizures  medicines to increase blood counts like filgrastim, pegfilgrastim, sargramostim  some antibiotics like amikacin, gentamicin, neomycin, streptomycin, tobramycin  vaccines Talk to your doctor or health care professional before taking any of these medicines:  acetaminophen  aspirin  ibuprofen  ketoprofen  naproxen This list may not describe all possible interactions. Give your health care provider a list of all the medicines, herbs, non-prescription drugs, or dietary supplements you use. Also tell them if you smoke, drink alcohol, or use illegal drugs. Some items may interact with your medicine. What should I watch for while using this medicine? Your condition will be monitored carefully while you are receiving this medicine. You will need important blood work done while you are taking this medicine. This drug may make you feel generally unwell. This is not uncommon, as chemotherapy can affect healthy cells as well as cancer cells. Report any side effects. Continue your course of treatment even though you feel ill unless your doctor tells you to stop. In some cases, you may be given additional medicines to help with side effects. Follow all directions for their  use. Call your doctor or health care professional for advice if you get a fever, chills or sore throat, or other symptoms of a cold or flu. Do not treat yourself. This drug decreases your body's ability to fight infections. Try to avoid being around people who are sick. This medicine may increase your risk to bruise or bleed. Call your doctor or health care professional if you notice any unusual bleeding. Be careful brushing and flossing your teeth or using a toothpick because you may get an infection or bleed more easily. If you have any dental work done, tell your dentist you are receiving this medicine. Avoid taking products that contain aspirin, acetaminophen, ibuprofen, naproxen, or ketoprofen unless instructed by your doctor. These medicines may hide a fever. Do not become pregnant while taking  this medicine. Women should inform their doctor if they wish to become pregnant or think they might be pregnant. There is a potential for serious side effects to an unborn child. Talk to your health care professional or pharmacist for more information. Do not breast-feed an infant while taking this medicine. What side effects may I notice from receiving this medicine? Side effects that you should report to your doctor or health care professional as soon as possible:  allergic reactions like skin rash, itching or hives, swelling of the face, lips, or tongue  signs of infection - fever or chills, cough, sore throat, pain or difficulty passing urine  signs of decreased platelets or bleeding - bruising, pinpoint red spots on the skin, black, tarry stools, nosebleeds  signs of decreased red blood cells - unusually weak or tired, fainting spells, lightheadedness  breathing problems  changes in hearing  changes in vision  chest pain  high blood pressure  low blood counts - This drug may decrease the number of white blood cells, red blood cells and platelets. You may be at increased risk for  infections and bleeding.  nausea and vomiting  pain, swelling, redness or irritation at the injection site  pain, tingling, numbness in the hands or feet  problems with balance, talking, walking  trouble passing urine or change in the amount of urine Side effects that usually do not require medical attention (report to your doctor or health care professional if they continue or are bothersome):  hair loss  loss of appetite  metallic taste in the mouth or changes in taste This list may not describe all possible side effects. Call your doctor for medical advice about side effects. You may report side effects to FDA at 1-800-FDA-1088. Where should I keep my medicine? This drug is given in a hospital or clinic and will not be stored at home. NOTE: This sheet is a summary. It may not cover all possible information. If you have questions about this medicine, talk to your doctor, pharmacist, or health care provider.  2020 Elsevier/Gold Standard (2007-10-22 14:38:05)  Pneumococcal Conjugate Vaccine (PCV13): What You Need to Know 1. Why get vaccinated? Pneumococcal conjugate vaccine (PCV13) can prevent pneumococcal disease. Pneumococcal disease refers to any illness caused by pneumococcal bacteria. These bacteria can cause many types of illnesses, including pneumonia, which is an infection of the lungs. Pneumococcal bacteria are one of the most common causes of pneumonia. Besides pneumonia, pneumococcal bacteria can also cause:  Ear infections  Sinus infections  Meningitis (infection of the tissue covering the brain and spinal cord)  Bacteremia (bloodstream infection) Anyone can get pneumococcal disease, but children under 56 years of age, people with certain medical conditions, adults 37 years or older, and cigarette smokers are at the highest risk. Most pneumococcal infections are mild. However, some can result in long-term problems, such as brain damage or hearing loss. Meningitis,  bacteremia, and pneumonia caused by pneumococcal disease can be fatal. 2. PCV13 PCV13 protects against 13 types of bacteria that cause pneumococcal disease. Infants and young children usually need 4 doses of pneumococcal conjugate vaccine, at 2, 4, 6, and 37-79 months of age. In some cases, a child might need fewer than 4 doses to complete PCV13 vaccination. A dose of PCV23 vaccine is also recommended for anyone 2 years or older with certain medical conditions if they did not already receive PCV13. This vaccine may be given to adults 32 years or older based on discussions between the patient and health  care provider. 3. Talk with your health care provider Tell your vaccine provider if the person getting the vaccine:  Has had an allergic reaction after a previous dose of PCV13, to an earlier pneumococcal conjugate vaccine known as PCV7, or to any vaccine containing diphtheria toxoid (for example, DTaP), or has any severe, life-threatening allergies.  In some cases, your health care provider may decide to postpone PCV13 vaccination to a future visit. People with minor illnesses, such as a cold, may be vaccinated. People who are moderately or severely ill should usually wait until they recover before getting PCV13. Your health care provider can give you more information. 4. Risks of a vaccine reaction  Redness, swelling, pain, or tenderness where the shot is given, and fever, loss of appetite, fussiness (irritability), feeling tired, headache, and chills can happen after PCV13. Young children may be at increased risk for seizures caused by fever after PCV13 if it is administered at the same time as inactivated influenza vaccine. Ask your health care provider for more information. People sometimes faint after medical procedures, including vaccination. Tell your provider if you feel dizzy or have vision changes or ringing in the ears. As with any medicine, there is a very remote chance of a vaccine  causing a severe allergic reaction, other serious injury, or death. 5. What if there is a serious problem? An allergic reaction could occur after the vaccinated person leaves the clinic. If you see signs of a severe allergic reaction (hives, swelling of the face and throat, difficulty breathing, a fast heartbeat, dizziness, or weakness), call 9-1-1 and get the person to the nearest hospital. For other signs that concern you, call your health care provider. Adverse reactions should be reported to the Vaccine Adverse Event Reporting System (VAERS). Your health care provider will usually file this report, or you can do it yourself. Visit the VAERS website at www.vaers.SamedayNews.es or call (973)001-3662. VAERS is only for reporting reactions, and VAERS staff do not give medical advice. 6. The National Vaccine Injury Compensation Program The [employer] Vaccine Injury Compensation Program (VICP) is a federal program that was created to compensate people who may have been injured by certain vaccines. Visit the VICP website at GoldCloset.com.ee or call 614-553-3432 to learn about the program and about filing a claim. There is a time limit to file a claim for compensation. 7. How can I learn more?  Ask your health care provider.  Call your local or state health department.  Contact the Centers for Disease Control and Prevention (CDC): ? Call 775-710-9547 (1-800-CDC-INFO) or ? Visit CDC's website at http://hunter.com/ Vaccine Information Statement PCV13 Vaccine (05/29/2018) This information is not intended to replace advice given to you by your health care provider. Make sure you discuss any questions you have with your health care provider. Document Released: 05/14/2006 Document Revised: 11/05/2018 Document Reviewed: 02/26/2018 Elsevier Patient Education  2020 [employer].

## 2019-05-29 NOTE — Progress Notes (Signed)
Oncology Nurse Navigator Documentation  Oncology Nurse Navigator Flowsheets 05/29/2019  Navigator Location [facility]  Navigator Encounter Type Other/I received an update from Ned Card NP regarding [name] needing molecular testing completed. I contacted path and they needed staging and icd code. I provided for them.  They will send tissue out for [facility] testing.  I will update [name].  Treatment Phase Pre-Tx/Tx Discussion  Barriers/Navigation Needs Coordination of Care  Interventions Coordination of Care  Coordination of Care Other  Acuity Level 2-Minimal Needs (1-2 Barriers Identified)  Time Spent with Patient 30

## 2019-05-30 ENCOUNTER — Other Ambulatory Visit: Payer: Self-pay | Admitting: Nurse Practitioner

## 2019-05-30 ENCOUNTER — Encounter: Payer: Self-pay | Admitting: Nurse Practitioner

## 2019-05-30 DIAGNOSIS — C3492 Malignant neoplasm of unspecified part of left bronchus or lung: Secondary | ICD-10-CM

## 2019-06-01 ENCOUNTER — Other Ambulatory Visit: Payer: Self-pay | Admitting: Oncology

## 2019-06-02 ENCOUNTER — Encounter: Payer: Self-pay | Admitting: Nurse Practitioner

## 2019-06-02 LAB — SURGICAL PATHOLOGY

## 2019-06-03 ENCOUNTER — Other Ambulatory Visit: Payer: Self-pay | Admitting: Radiology

## 2019-06-04 ENCOUNTER — Ambulatory Visit (HOSPITAL_COMMUNITY)
Admission: RE | Admit: 2019-06-04 | Discharge: 2019-06-04 | Disposition: A | Discharge status: Home / Self Care | Payer: Medicare Other | Source: Ambulatory Visit | Attending: Nurse Practitioner | Admitting: Nurse Practitioner

## 2019-06-04 ENCOUNTER — Other Ambulatory Visit: Payer: Self-pay

## 2019-06-04 ENCOUNTER — Encounter (HOSPITAL_COMMUNITY): Payer: Self-pay

## 2019-06-04 ENCOUNTER — Other Ambulatory Visit: Payer: Self-pay | Admitting: Nurse Practitioner

## 2019-06-04 DIAGNOSIS — Z885 Allergy status to narcotic agent status: Secondary | ICD-10-CM | POA: Insufficient documentation

## 2019-06-04 DIAGNOSIS — C3492 Malignant neoplasm of unspecified part of left bronchus or lung: Secondary | ICD-10-CM | POA: Diagnosis present

## 2019-06-04 DIAGNOSIS — M1990 Unspecified osteoarthritis, unspecified site: Secondary | ICD-10-CM | POA: Insufficient documentation

## 2019-06-04 DIAGNOSIS — E785 Hyperlipidemia, unspecified: Secondary | ICD-10-CM | POA: Diagnosis not present

## 2019-06-04 DIAGNOSIS — R918 Other nonspecific abnormal finding of lung field: Secondary | ICD-10-CM

## 2019-06-04 DIAGNOSIS — Z79899 Other long term (current) drug therapy: Secondary | ICD-10-CM | POA: Diagnosis not present

## 2019-06-04 DIAGNOSIS — Z955 Presence of coronary angioplasty implant and graft: Secondary | ICD-10-CM | POA: Diagnosis not present

## 2019-06-04 DIAGNOSIS — G2581 Restless legs syndrome: Secondary | ICD-10-CM | POA: Insufficient documentation

## 2019-06-04 DIAGNOSIS — R7303 Prediabetes: Secondary | ICD-10-CM | POA: Insufficient documentation

## 2019-06-04 DIAGNOSIS — M899 Disorder of bone, unspecified: Secondary | ICD-10-CM | POA: Insufficient documentation

## 2019-06-04 DIAGNOSIS — I10 Essential (primary) hypertension: Secondary | ICD-10-CM | POA: Diagnosis not present

## 2019-06-04 DIAGNOSIS — I2510 Atherosclerotic heart disease of native coronary artery without angina pectoris: Secondary | ICD-10-CM | POA: Insufficient documentation

## 2019-06-04 DIAGNOSIS — G709 Myoneural disorder, unspecified: Secondary | ICD-10-CM | POA: Diagnosis not present

## 2019-06-04 DIAGNOSIS — M810 Age-related osteoporosis without current pathological fracture: Secondary | ICD-10-CM | POA: Insufficient documentation

## 2019-06-04 DIAGNOSIS — Z7984 Long term (current) use of oral hypoglycemic drugs: Secondary | ICD-10-CM | POA: Insufficient documentation

## 2019-06-04 HISTORY — PX: IR IMAGING GUIDED PORT INSERTION: IMG5740

## 2019-06-04 LAB — CBC WITH DIFFERENTIAL/PLATELET
Abs Immature Granulocytes: 0.01 10*3/uL (ref 0.00–0.07)
Basophils Absolute: 0 10*3/uL (ref 0.0–0.1)
Basophils Relative: 1 %
Eosinophils Absolute: 0.1 10*3/uL (ref 0.0–0.5)
Eosinophils Relative: 3 %
HCT: 37.8 % (ref 36.0–46.0)
Hemoglobin: 12 g/dL (ref 12.0–15.0)
Immature Granulocytes: 0 %
Lymphocytes Relative: 23 %
Lymphs Abs: 1.2 10*3/uL (ref 0.7–4.0)
MCH: 30.3 pg (ref 26.0–34.0)
MCHC: 31.7 g/dL (ref 30.0–36.0)
MCV: 95.5 fL (ref 80.0–100.0)
Monocytes Absolute: 0.4 10*3/uL (ref 0.1–1.0)
Monocytes Relative: 8 %
Neutro Abs: 3.4 10*3/uL (ref 1.7–7.7)
Neutrophils Relative %: 65 %
Platelets: 223 10*3/uL (ref 150–400)
RBC: 3.96 MIL/uL (ref 3.87–5.11)
RDW: 12.1 % (ref 11.5–15.5)
WBC: 5.2 10*3/uL (ref 4.0–10.5)
nRBC: 0 % (ref 0.0–0.2)

## 2019-06-04 LAB — PROTIME-INR
INR: 1 (ref 0.8–1.2)
Prothrombin Time: 12.8 seconds (ref 11.4–15.2)

## 2019-06-04 LAB — GLUCOSE, CAPILLARY: Glucose-Capillary: 98 mg/dL (ref 70–99)

## 2019-06-04 MED ORDER — FENTANYL CITRATE (PF) 100 MCG/2ML IJ SOLN
INTRAMUSCULAR | Status: AC | PRN
  Administered 2019-06-04 (×2): 25 ug via INTRAVENOUS

## 2019-06-04 MED ORDER — CEFAZOLIN SODIUM-DEXTROSE 2-4 GM/100ML-% IV SOLN
INTRAVENOUS | Status: AC
  Administered 2019-06-04: 2 g via INTRAVENOUS
  Filled 2019-06-04: qty 100

## 2019-06-04 MED ORDER — LIDOCAINE-EPINEPHRINE (PF) 2 %-1:200000 IJ SOLN
INTRAMUSCULAR | Status: AC
  Filled 2019-06-04: qty 20

## 2019-06-04 MED ORDER — CEFAZOLIN SODIUM-DEXTROSE 2-4 GM/100ML-% IV SOLN
2.0000 g | INTRAVENOUS | Status: AC
  Administered 2019-06-04: 12:00:00 2 g via INTRAVENOUS

## 2019-06-04 MED ORDER — HEPARIN SOD (PORK) LOCK FLUSH 100 UNIT/ML IV SOLN
INTRAVENOUS | Status: AC | PRN
  Administered 2019-06-04: 500 [IU] via INTRAVENOUS

## 2019-06-04 MED ORDER — MIDAZOLAM HCL 2 MG/2ML IJ SOLN
INTRAMUSCULAR | Status: AC
  Filled 2019-06-04: qty 2

## 2019-06-04 MED ORDER — LIDOCAINE-EPINEPHRINE (PF) 2 %-1:200000 IJ SOLN
INTRAMUSCULAR | Status: AC | PRN
  Administered 2019-06-04 (×2): 10 mL

## 2019-06-04 MED ORDER — FENTANYL CITRATE (PF) 100 MCG/2ML IJ SOLN
INTRAMUSCULAR | Status: AC
  Filled 2019-06-04: qty 2

## 2019-06-04 MED ORDER — SODIUM CHLORIDE 0.9 % IV SOLN
INTRAVENOUS | Status: DC
  Administered 2019-06-04: 11:00:00 via INTRAVENOUS

## 2019-06-04 MED ORDER — MIDAZOLAM HCL 2 MG/2ML IJ SOLN
INTRAMUSCULAR | Status: AC | PRN
  Administered 2019-06-04: 1 mg via INTRAVENOUS
  Administered 2019-06-04: 0.5 mg via INTRAVENOUS

## 2019-06-04 MED ORDER — HEPARIN SOD (PORK) LOCK FLUSH 100 UNIT/ML IV SOLN
INTRAVENOUS | Status: AC
  Filled 2019-06-04: qty 5

## 2019-06-04 NOTE — Discharge Instructions (Addendum)
DO NOT APPLY EMLA CREAM OR LOTIONS OVER THE PORT SITE FOR THE NEXT 2 WEEKS For any questions or concerns call [phone]; for after hours call [phone] and ask for on call MD    Implanted [place] An implanted port is a device that is placed under the skin. It is usually placed in the chest. The device can be used to give IV medicine, to take blood, or for dialysis. You may have an implanted port if:  You need IV medicine that would be irritating to the small veins in your hands or arms.  You need IV medicines, such as antibiotics, for a long period of time.  You need IV nutrition for a long period of time.  You need dialysis. Having a port means that your health care provider will not need to use the veins in your arms for these procedures. You may have fewer limitations when using a port than you would if you used other types of long-term IVs, and you will likely be able to return to normal activities after your incision heals. An implanted port has two main parts:  Reservoir. The reservoir is the part where a needle is inserted to give medicines or draw blood. The reservoir is round. After it is placed, it appears as a small, raised area under your skin.  Catheter. The catheter is a thin, flexible tube that connects the reservoir to a vein. Medicine that is inserted into the reservoir goes into the catheter and then into the vein. How is my port accessed? To access your port:  A numbing cream may be placed on the skin over the port site.  Your health care provider will put on a mask and sterile gloves.  The skin over your port will be cleaned carefully with a germ-killing soap and allowed to dry.  Your health care provider will gently pinch the port and insert a needle into it.  Your health care provider will check for a blood return to make sure the port is in the vein and is not clogged.  If your port needs to remain accessed to get medicine continuously (constant  infusion), your health care provider will place a clear bandage (dressing) over the needle site. The dressing and needle will need to be changed every week, or as told by your health care provider. What is flushing? Flushing helps keep the port from getting clogged. Follow instructions from your health care provider about how and when to flush the port. Ports are usually flushed with saline solution or a medicine called heparin. The need for flushing will depend on how the port is used:  If the port is only used from time to time to give medicines or draw blood, the port may need to be flushed: ? Before and after medicines have been given. ? Before and after blood has been drawn. ? As part of routine maintenance. Flushing may be recommended every 4-6 weeks.  If a constant infusion is running, the port may not need to be flushed.  Throw away any syringes in a disposal container that is meant for sharp items (sharps container). You can buy a sharps container from a pharmacy, or you can make one by using an empty hard plastic bottle with a cover. How long will my port stay implanted? The port can stay in for as long as your health care provider thinks it is needed. When it is time for the port to come out, a surgery will be  done to remove it. The surgery will be similar to the procedure that was done to put the port in. Follow these instructions at home:   Flush your port as told by your health care provider.  If you need an infusion over several days, follow instructions from your health care provider about how to take care of your port site. Make sure you: ? Wash your hands with soap and water before you change your dressing. If soap and water are not available, use alcohol-based hand sanitizer. ? Change your dressing as told by your health care provider. ? Place any used dressings or infusion bags into a plastic bag. Throw that bag in the trash. ? Keep the dressing that covers the needle clean  and dry. Do not get it wet. ? Do not use scissors or sharp objects near the tube. ? Keep the tube clamped, unless it is being used.  Check your port site every day for signs of infection. Check for: ? Redness, swelling, or pain. ? Fluid or blood. ? Pus or a bad smell.  Protect the skin around the port site. ? Avoid wearing bra straps that rub or irritate the site. ? Protect the skin around your port from seat belts. Place a soft pad over your chest if needed.  Bathe or shower as told by your health care provider. The site may get wet as long as you are not actively receiving an infusion.  Return to your normal activities as told by your health care provider. Ask your health care provider what activities are safe for you.  Carry a medical alert card or wear a medical alert bracelet at all times. This will let health care providers know that you have an implanted port in case of an emergency. Get help right away if:  You have redness, swelling, or pain at the port site.  You have fluid or blood coming from your port site.  You have pus or a bad smell coming from the port site.  You have a fever. Summary  Implanted ports are usually placed in the chest for long-term IV access.  Follow instructions from your health care provider about flushing the port and changing bandages (dressings).  Take care of the area around your port by avoiding clothing that puts pressure on the area, and by watching for signs of infection.  Protect the skin around your port from seat belts. Place a soft pad over your chest if needed.  Get help right away if you have a fever or you have redness, swelling, pain, drainage, or a bad smell at the port site. This information is not intended to replace advice given to you by your health care provider. Make sure you discuss any questions you have with your health care provider. Document Released: 07/17/2005 Document Revised: 11/08/2018 Document Reviewed:  08/19/2016 Elsevier Patient Education  [place]. Moderate Conscious Sedation, Adult, Care After These instructions provide you with information about caring for yourself after your procedure. Your health care provider may also give you more specific instructions. Your treatment has been planned according to current medical practices, but problems sometimes occur. Call your health care provider if you have any problems or questions after your procedure. What can I expect after the procedure? After your procedure, it is common:  To feel sleepy for several hours.  To feel clumsy and have poor balance for several hours.  To have poor judgment for several hours.  To vomit if you eat too  soon. Follow these instructions at home: For at least 24 hours after the procedure:   Do not: ? Participate in activities where you could fall or become injured. ? Drive. ? Use heavy machinery. ? Drink alcohol. ? Take sleeping pills or medicines that cause drowsiness. ? Make important decisions or sign legal documents. ? Take care of children on your own.  Rest. Eating and drinking  Follow the diet recommended by your health care provider.  If you vomit: ? Drink water, juice, or soup when you can drink without vomiting. ? Make sure you have little or no nausea before eating solid foods. General instructions  Have a responsible adult stay with you until you are awake and alert.  Take over-the-counter and prescription medicines only as told by your health care provider.  If you smoke, do not smoke without supervision.  Keep all follow-up visits as told by your health care provider. This is important. Contact a health care provider if:  You keep feeling nauseous or you keep vomiting.  You feel light-headed.  You develop a rash.  You have a fever. Get help right away if:  You have trouble breathing. This information is not intended to replace advice given to you by your health  care provider. Make sure you discuss any questions you have with your health care provider. Document Released: 05/07/2013 Document Revised: 06/29/2017 Document Reviewed: 11/06/2015 Elsevier Patient Education  2020 [employer].

## 2019-06-04 NOTE — Procedures (Signed)
  Procedure: R IJ Port placement   EBL:   minimal Complications:  none immediate  See full dictation in [facility] PACS.  D. Audre Cenci MD Main # [phone] Pager  [phone]    

## 2019-06-04 NOTE — H&P (Signed)
Referring Physician(s): Sherrill,B  Supervising Physician: Arne Cleveland  Patient Status:  [facility]  Chief Complaint: "I'm getting a port a cath"   Subjective: Patient familiar to IR service from left lung nodule biopsy on 05/26/2019 with pathology yielding adenosquamous carcinoma.  She has poor venous access and presents today for Port-A-Cath placement for palliative chemotherapy. She currently denies fever, headache, chest pain, dyspnea, cough, abdominal pain, nausea, vomiting or bleeding.  She does have back pain.  Past Medical History:  Diagnosis Date  . Aneurysm ([place])    Right eye a non DES stent was placed so that she would not required long term dual antiplatelet therapy.  . Anxiety    not currently taking any meds  . Arthritis   . CAD (coronary artery disease)   . Diabetes mellitus without complication (HCC)    prediabetes per pt  . H/O hiatal hernia   . Heart murmur   . History of stress test 03/2012   The post stress myocardial perfusion images show a normal pattern of perfusion in all region. The post left ventricles is normal in size. There is no scintigraphic of inductible myocardial ischemia. The post EF is 73  . Hx of echocardiogram 11/2010   Ef 67% Normal size chambes, Aortic valve sclerosis without stenosis, No other significant valvular abnormalities, No percardial effusion.  . Hyperlipemia   . Hypertension   . Insulin resistance   . Multiple thyroid nodules   . Neuromuscular disorder (HCC)    nerve pain after shingles  . Osteoporosis   . Restless legs   . Shingles    Past Surgical History:  Procedure Laterality Date  . ABDOMINAL HYSTERECTOMY  1970  . ANGIOPLASTY     Stenting of a 90% eccentric right coronary artery stenosis and had a 3.0x15 mm Integrity bare-metal stent inserted  . ANTERIOR CERVICAL DECOMP/DISCECTOMY FUSION N/A 07/30/2013   Procedure: ANTERIOR CERVICAL DECOMPRESSION/DISCECTOMY FUSION CERVICAL FIVE -SIX;  Surgeon: Eustace Moore,  MD;  Location: [facility];  Service: Neurosurgery;  Laterality: N/A;  . APPENDECTOMY    . BREAST SURGERY Left    cysts removed from left breast (in her 20'2)  . CARDIAC CATHETERIZATION     Showed a widely patent stent, she did have 60% ostial diagonal-1 stenosis, She also had mild luminal irregularities of her LAD.  [facility] Kitchen COLONOSCOPY    . EYE SURGERY Bilateral 2009   cateract surgery- bilateral  . REFRACTIVE SURGERY Left 2011  . TONSILLECTOMY         Allergies: Codeine  Medications: Prior to Admission medications   Medication Sig Start Date End Date Taking? Authorizing Provider  acetaminophen (TYLENOL) 500 MG tablet Take 1,000 mg by mouth 2 (two) times daily as needed (pain).   Yes [provider]  ALPRAZolam Duanne Moron) 0.5 MG tablet Take 1 tablet by mouth 2 (two) times daily as needed for anxiety.  09/11/13  Yes [provider]  diclofenac sodium (VOLTAREN) 1 % GEL Apply 1 application topically 3 (three) times daily as needed (pain).   Yes [provider]  folic acid (FOLVITE) 1 MG tablet Take 1 tablet (1 mg total) by mouth daily. 05/29/19  Yes Owens Shark, NP  gabapentin (NEURONTIN) 300 MG capsule Take 300 mg by mouth at bedtime.    Yes [provider]  lisinopril (PRINIVIL,ZESTRIL) 10 MG tablet Take 1 tablet (10 mg total) by mouth daily. Patient taking differently: Take 10 mg by mouth at bedtime.  09/12/18 06/04/19 Yes Claiborne Billings,  Joyice Faster, MD  Magnesium 250 MG TABS Take 500 mg by mouth at bedtime.   Yes [provider]  metFORMIN (GLUCOPHAGE-XR) 500 MG 24 hr tablet Take 500 mg by mouth at bedtime.  02/20/15  Yes [provider]  metoprolol tartrate (LOPRESSOR) 25 MG tablet Take 25 mg by mouth at bedtime.    Yes [provider]  Multiple Vitamins-Minerals (CENTRUM SILVER PO) Take 1 tablet by mouth daily.   Yes [provider]  naproxen sodium (ALEVE) 220 MG tablet Take 440 mg by mouth 2 (two) times daily as needed  (pain).   Yes [provider]  polyethylene glycol (MIRALAX / GLYCOLAX) packet Take 17 g by mouth every morning.    Yes [provider]  rOPINIRole (REQUIP) 1 MG tablet Take 1 mg by mouth 2 (two) times daily. In the evening   Yes [provider]  rosuvastatin (CRESTOR) 40 MG tablet Take 40 mg by mouth at bedtime.    Yes [provider]  traZODone (DESYREL) 100 MG tablet Take 100 mg by mouth at bedtime.   Yes [provider]  Vitamin D, Ergocalciferol, (DRISDOL) 50000 UNITS CAPS capsule Take 50,000 Units by mouth every 7 (seven) days.   Yes [provider]  zinc gluconate 50 MG tablet Take 50 mg by mouth daily.   Yes [provider]  nitroGLYCERIN (NITROSTAT) 0.4 MG SL tablet Place 0.4 mg under the tongue every 5 (five) minutes as needed for chest pain.    [provider]  prochlorperazine (COMPAZINE) 10 MG tablet Take 1 tablet (10 mg total) by mouth every 6 (six) hours as needed for nausea or vomiting. 05/29/19   Owens Shark, NP  traMADol (ULTRAM) 50 MG tablet Take 50 mg by mouth every 6 (six) hours as needed for moderate pain.    [provider]     Vital Signs: BP (!) 135/56 (BP Location: Right Arm)   Pulse (!) 52   Temp 98.2 F (36.8 C) (Oral)   Resp 18   SpO2 95%   Physical Exam awake, alert.  Chest clear to auscultation bilaterally.  Heart with slightly bradycardic but regular rhythm, postive murmur.  Abdomen soft, positive bowel sounds, nontender.  Trace pretibial edema bilaterally.  Imaging: No results found.  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  All of the patient's questions were answered, patient is agreeable to proceed. Consent signed and in chart.  LABS PENDING   Electronically Signed: D. Rowe Robert, PA-C 06/04/2019, 10:32 AM   I spent a  total of 25 minutes at the the patient's bedside AND on the patient's hospital floor or unit, greater than 50% of which was counseling/coordinating care for Port-A-Cath placement

## 2019-06-05 ENCOUNTER — Other Ambulatory Visit: Payer: Self-pay

## 2019-06-05 ENCOUNTER — Inpatient Hospital Stay: Payer: Medicare Other | Attending: Oncology

## 2019-06-05 DIAGNOSIS — Z5112 Encounter for antineoplastic immunotherapy: Secondary | ICD-10-CM | POA: Insufficient documentation

## 2019-06-05 DIAGNOSIS — C3432 Malignant neoplasm of lower lobe, left bronchus or lung: Secondary | ICD-10-CM | POA: Insufficient documentation

## 2019-06-05 DIAGNOSIS — Z79899 Other long term (current) drug therapy: Secondary | ICD-10-CM | POA: Insufficient documentation

## 2019-06-05 DIAGNOSIS — Z5111 Encounter for antineoplastic chemotherapy: Secondary | ICD-10-CM | POA: Insufficient documentation

## 2019-06-06 ENCOUNTER — Inpatient Hospital Stay: Payer: Medicare Other

## 2019-06-06 ENCOUNTER — Other Ambulatory Visit: Payer: Self-pay

## 2019-06-06 ENCOUNTER — Inpatient Hospital Stay (HOSPITAL_BASED_OUTPATIENT_CLINIC_OR_DEPARTMENT_OTHER): Payer: Medicare Other | Admitting: Nurse Practitioner

## 2019-06-06 ENCOUNTER — Encounter: Payer: Self-pay | Admitting: Nurse Practitioner

## 2019-06-06 VITALS — BP 139/70 | HR 58 | Temp 98.0°F | Resp 18 | Ht 64.0 in | Wt 174.2 lb

## 2019-06-06 DIAGNOSIS — Z79899 Other long term (current) drug therapy: Secondary | ICD-10-CM | POA: Diagnosis not present

## 2019-06-06 DIAGNOSIS — C3492 Malignant neoplasm of unspecified part of left bronchus or lung: Secondary | ICD-10-CM

## 2019-06-06 DIAGNOSIS — C3432 Malignant neoplasm of lower lobe, left bronchus or lung: Secondary | ICD-10-CM | POA: Diagnosis present

## 2019-06-06 DIAGNOSIS — Z5111 Encounter for antineoplastic chemotherapy: Secondary | ICD-10-CM | POA: Diagnosis present

## 2019-06-06 DIAGNOSIS — Z5112 Encounter for antineoplastic immunotherapy: Secondary | ICD-10-CM | POA: Diagnosis present

## 2019-06-06 DIAGNOSIS — Z95828 Presence of other vascular implants and grafts: Secondary | ICD-10-CM

## 2019-06-06 LAB — CBC WITH DIFFERENTIAL (CANCER CENTER ONLY)
Abs Immature Granulocytes: 0.02 10*3/uL (ref 0.00–0.07)
Basophils Absolute: 0 10*3/uL (ref 0.0–0.1)
Basophils Relative: 1 %
Eosinophils Absolute: 0.3 10*3/uL (ref 0.0–0.5)
Eosinophils Relative: 5 %
HCT: 34.7 % — ABNORMAL LOW (ref 36.0–46.0)
Hemoglobin: 11.4 g/dL — ABNORMAL LOW (ref 12.0–15.0)
Immature Granulocytes: 0 %
Lymphocytes Relative: 24 %
Lymphs Abs: 1.6 10*3/uL (ref 0.7–4.0)
MCH: 30.6 pg (ref 26.0–34.0)
MCHC: 32.9 g/dL (ref 30.0–36.0)
MCV: 93 fL (ref 80.0–100.0)
Monocytes Absolute: 0.6 10*3/uL (ref 0.1–1.0)
Monocytes Relative: 9 %
Neutro Abs: 4.1 10*3/uL (ref 1.7–7.7)
Neutrophils Relative %: 61 %
Platelet Count: 210 10*3/uL (ref 150–400)
RBC: 3.73 MIL/uL — ABNORMAL LOW (ref 3.87–5.11)
RDW: 12.4 % (ref 11.5–15.5)
WBC Count: 6.7 10*3/uL (ref 4.0–10.5)
nRBC: 0 % (ref 0.0–0.2)

## 2019-06-06 LAB — CMP (CANCER CENTER ONLY)
ALT: 13 U/L (ref 0–44)
AST: 17 U/L (ref 15–41)
Albumin: 4.1 g/dL (ref 3.5–5.0)
Alkaline Phosphatase: 78 U/L (ref 38–126)
Anion gap: 9 (ref 5–15)
BUN: 24 mg/dL — ABNORMAL HIGH (ref 8–23)
CO2: 24 mmol/L (ref 22–32)
Calcium: 9.5 mg/dL (ref 8.9–10.3)
Chloride: 106 mmol/L (ref 98–111)
Creatinine: 0.89 mg/dL (ref 0.44–1.00)
GFR, Est AFR Am: >60 mL/min (ref >60)
GFR, Estimated: >60 mL/min (ref >60)
Glucose, Bld: 95 mg/dL (ref 70–99)
Potassium: 5.1 mmol/L (ref 3.5–5.1)
Sodium: 139 mmol/L (ref 135–145)
Total Bilirubin: 0.3 mg/dL (ref 0.3–1.2)
Total Protein: 7.4 g/dL (ref 6.5–8.1)

## 2019-06-06 LAB — TSH: TSH: 0.36 u[IU]/mL (ref 0.308–3.960)

## 2019-06-06 MED ORDER — SODIUM CHLORIDE 0.9% FLUSH
10.0000 mL | INTRAVENOUS | Status: DC | PRN
  Administered 2019-06-06: 10 mL
  Filled 2019-06-06: qty 10

## 2019-06-06 MED ORDER — HEPARIN SOD (PORK) LOCK FLUSH 100 UNIT/ML IV SOLN
500.0000 [IU] | Freq: Once | INTRAVENOUS | Status: AC | PRN
  Administered 2019-06-06: 500 [IU]
  Filled 2019-06-06: qty 5

## 2019-06-06 MED ORDER — PALONOSETRON HCL INJECTION 0.25 MG/5ML
0.2500 mg | Freq: Once | INTRAVENOUS | Status: AC
  Administered 2019-06-06: 0.25 mg via INTRAVENOUS

## 2019-06-06 MED ORDER — ACETAMINOPHEN 500 MG PO TABS
1000.0000 mg | ORAL_TABLET | Freq: Once | ORAL | Status: AC
  Administered 2019-06-06: 1000 mg via ORAL

## 2019-06-06 MED ORDER — SODIUM CHLORIDE 0.9 % IV SOLN
Freq: Once | INTRAVENOUS | Status: AC
  Administered 2019-06-06: 13:00:00 via INTRAVENOUS
  Filled 2019-06-06: qty 5

## 2019-06-06 MED ORDER — SODIUM CHLORIDE 0.9 % IV SOLN
395.0000 mg | Freq: Once | INTRAVENOUS | Status: AC
  Administered 2019-06-06: 400 mg via INTRAVENOUS
  Filled 2019-06-06: qty 40

## 2019-06-06 MED ORDER — SODIUM CHLORIDE 0.9 % IV SOLN
530.0000 mg/m2 | Freq: Once | INTRAVENOUS | Status: AC
  Administered 2019-06-06: 1000 mg via INTRAVENOUS
  Filled 2019-06-06: qty 40

## 2019-06-06 MED ORDER — SODIUM CHLORIDE 0.9% FLUSH
10.0000 mL | INTRAVENOUS | Status: DC | PRN
  Administered 2019-06-06: 10 mL via INTRAVENOUS
  Filled 2019-06-06: qty 10

## 2019-06-06 MED ORDER — ACETAMINOPHEN 500 MG PO TABS
ORAL_TABLET | ORAL | Status: AC
  Filled 2019-06-06: qty 2

## 2019-06-06 MED ORDER — SODIUM CHLORIDE 0.9 % IV SOLN
Freq: Once | INTRAVENOUS | Status: AC
  Administered 2019-06-06: 13:00:00 via INTRAVENOUS
  Filled 2019-06-06: qty 250

## 2019-06-06 MED ORDER — PALONOSETRON HCL INJECTION 0.25 MG/5ML
INTRAVENOUS | Status: AC
  Filled 2019-06-06: qty 5

## 2019-06-06 MED ORDER — SODIUM CHLORIDE 0.9 % IV SOLN
200.0000 mg | Freq: Once | INTRAVENOUS | Status: AC
  Administered 2019-06-06: 200 mg via INTRAVENOUS
  Filled 2019-06-06: qty 8

## 2019-06-06 NOTE — Patient Instructions (Addendum)
[facility] Discharge Instructions for Patients Receiving Chemotherapy  Today you received the following chemotherapy agents Pembrolizumab (Keytruda) ,Pemetrexed (Alimta) and  Carboplatin  To help prevent nausea and vomiting after your treatment, we encourage you to take your nausea medication as directed.    If you develop nausea and vomiting that is not controlled by your nausea medication, call the clinic.   BELOW ARE SYMPTOMS THAT SHOULD BE REPORTED IMMEDIATELY:  *FEVER GREATER THAN 100.5 F  *CHILLS WITH OR WITHOUT FEVER  NAUSEA AND VOMITING THAT IS NOT CONTROLLED WITH YOUR NAUSEA MEDICATION  *UNUSUAL SHORTNESS OF BREATH  *UNUSUAL BRUISING OR BLEEDING  TENDERNESS IN MOUTH AND THROAT WITH OR WITHOUT PRESENCE OF ULCERS  *URINARY PROBLEMS  *BOWEL PROBLEMS  UNUSUAL RASH Items with * indicate a potential emergency and should be followed up as soon as possible.  Feel free to call the clinic should you have any questions or concerns. The clinic phone number is [phone].  Please show the [facility] at check-in to the Emergency Department and triage nurse.  Pembrolizumab [facility]) injection What is this medicine? PEMBROLIZUMAB (pem broe liz ue mab) is a monoclonal antibody. It is used to treat bladder cancer, cervical cancer, endometrial cancer, esophageal cancer, head and neck cancer, hepatocellular cancer, Hodgkin lymphoma, kidney cancer, lymphoma, melanoma, Merkel cell carcinoma, lung cancer, stomach cancer, urothelial cancer, and cancers that have a certain genetic condition. This medicine may be used for other purposes; ask your health care provider or pharmacist if you have questions. COMMON BRAND NAME(S): Keytruda What should I tell my health care provider before I take this medicine? They need to know if you have any of these conditions:  diabetes  immune system problems  inflammatory bowel disease  liver disease  lung or breathing  disease  lupus  received or scheduled to receive an organ transplant or a stem-cell transplant that uses donor stem cells  an unusual or allergic reaction to pembrolizumab, other medicines, foods, dyes, or preservatives  pregnant or trying to get pregnant  breast-feeding How should I use this medicine? This medicine is for infusion into a vein. It is given by a health care professional in a hospital or clinic setting. A special MedGuide will be given to you before each treatment. Be sure to read this information carefully each time. Talk to your pediatrician regarding the use of this medicine in children. While this drug may be prescribed for selected conditions, precautions do apply. Overdosage: If you think you have taken too much of this medicine contact a poison control center or emergency room at once. NOTE: This medicine is only for you. Do not share this medicine with others. What if I miss a dose? It is important not to miss your dose. Call your doctor or health care professional if you are unable to keep an appointment. What may interact with this medicine? Interactions have not been studied. Give your health care provider a list of all the medicines, herbs, non-prescription drugs, or dietary supplements you use. Also tell them if you smoke, drink alcohol, or use illegal drugs. Some items may interact with your medicine. This list may not describe all possible interactions. Give your health care provider a list of all the medicines, herbs, non-prescription drugs, or dietary supplements you use. Also tell them if you smoke, drink alcohol, or use illegal drugs. Some items may interact with your medicine. What should I watch for while using this medicine? Your condition will be monitored carefully while  you are receiving this medicine. You may need blood work done while you are taking this medicine. Do not become pregnant while taking this medicine or for 4 months after stopping it.  Women should inform their doctor if they wish to become pregnant or think they might be pregnant. There is a potential for serious side effects to an unborn child. Talk to your health care professional or pharmacist for more information. Do not breast-feed an infant while taking this medicine or for 4 months after the last dose. What side effects may I notice from receiving this medicine? Side effects that you should report to your doctor or health care professional as soon as possible:  allergic reactions like skin rash, itching or hives, swelling of the face, lips, or tongue  bloody or black, tarry  breathing problems  changes in vision  chest pain  chills  confusion  constipation  cough  diarrhea  dizziness or feeling faint or lightheaded  fast or irregular heartbeat  fever  flushing  hair loss  joint pain  low blood counts - this medicine may decrease the number of white blood cells, red blood cells and platelets. You may be at increased risk for infections and bleeding.  muscle pain  muscle weakness  persistent headache  redness, blistering, peeling or loosening of the skin, including inside the mouth  signs and symptoms of high blood sugar such as dizziness; dry mouth; dry skin; fruity breath; nausea; stomach pain; increased hunger or thirst; increased urination  signs and symptoms of kidney injury like trouble passing urine or change in the amount of urine  signs and symptoms of liver injury like dark urine, light-colored stools, loss of appetite, nausea, right upper belly pain, yellowing of the eyes or skin  sweating  swollen lymph nodes  weight loss Side effects that usually do not require medical attention (report to your doctor or health care professional if they continue or are bothersome):  decreased appetite  muscle pain  tiredness This list may not describe all possible side effects. Call your doctor for medical advice about side effects.  You may report side effects to FDA at 1-800-FDA-1088. Where should I keep my medicine? This drug is given in a hospital or clinic and will not be stored at home. NOTE: This sheet is a summary. It may not cover all possible information. If you have questions about this medicine, talk to your doctor, pharmacist, or health care provider.  2020 Elsevier/Gold Standard (2018-08-13 13:46:58)  Pemetrexed (Alimta) injection What is this medicine? PEMETREXED (PEM e TREX ed) is a chemotherapy drug used to treat lung cancers like non-small cell lung cancer and mesothelioma. It may also be used to treat other cancers. This medicine may be used for other purposes; ask your health care provider or pharmacist if you have questions. COMMON BRAND NAME(S): Alimta What should I tell my health care provider before I take this medicine? They need to know if you have any of these conditions:  infection (especially a virus infection such as chickenpox, cold sores, or herpes)  kidney disease  low blood counts, like low white cell, platelet, or red cell counts  lung or breathing disease, like asthma  radiation therapy  an unusual or allergic reaction to pemetrexed, other medicines, foods, dyes, or preservative  pregnant or trying to get pregnant  breast-feeding How should I use this medicine? This drug is given as an infusion into a vein. It is administered in a hospital or clinic by a  specially trained health care professional. Talk to your pediatrician regarding the use of this medicine in children. Special care may be needed. Overdosage: If you think you have taken too much of this medicine contact a poison control center or emergency room at once. NOTE: This medicine is only for you. Do not share this medicine with others. What if I miss a dose? It is important not to miss your dose. Call your doctor or health care professional if you are unable to keep an appointment. What may interact with this  medicine? This medicine may interact with the following medications:  Ibuprofen This list may not describe all possible interactions. Give your health care provider a list of all the medicines, herbs, non-prescription drugs, or dietary supplements you use. Also tell them if you smoke, drink alcohol, or use illegal drugs. Some items may interact with your medicine. What should I watch for while using this medicine? Visit your doctor for checks on your progress. This drug may make you feel generally unwell. This is not uncommon, as chemotherapy can affect healthy cells as well as cancer cells. Report any side effects. Continue your course of treatment even though you feel ill unless your doctor tells you to stop. In some cases, you may be given additional medicines to help with side effects. Follow all directions for their use. Call your doctor or health care professional for advice if you get a fever, chills or sore throat, or other symptoms of a cold or flu. Do not treat yourself. This drug decreases your body's ability to fight infections. Try to avoid being around people who are sick. This medicine may increase your risk to bruise or bleed. Call your doctor or health care professional if you notice any unusual bleeding. Be careful brushing and flossing your teeth or using a toothpick because you may get an infection or bleed more easily. If you have any dental work done, tell your dentist you are receiving this medicine. Avoid taking products that contain aspirin, acetaminophen, ibuprofen, naproxen, or ketoprofen unless instructed by your doctor. These medicines may hide a fever. Call your doctor or health care professional if you get diarrhea or mouth sores. Do not treat yourself. To protect your kidneys, drink water or other fluids as directed while you are taking this medicine. Do not become pregnant while taking this medicine or for 6 months after stopping it. Women should inform their doctor if  they wish to become pregnant or think they might be pregnant. Men should not father a child while taking this medicine and for 3 months after stopping it. This may interfere with the ability to father a child. You should talk to your doctor or health care professional if you are concerned about your fertility. There is a potential for serious side effects to an unborn child. Talk to your health care professional or pharmacist for more information. Do not breast-feed an infant while taking this medicine or for 1 week after stopping it. What side effects may I notice from receiving this medicine? Side effects that you should report to your doctor or health care professional as soon as possible:  allergic reactions like skin rash, itching or hives, swelling of the face, lips, or tongue  breathing problems  redness, blistering, peeling or loosening of the skin, including inside the mouth  signs and symptoms of bleeding such as bloody or black, tarry stools; red or dark-brown urine; spitting up blood or brown material that looks like coffee grounds; red spots  on the skin; unusual bruising or bleeding from the eye, gums, or nose  signs and symptoms of infection like fever or chills; cough; sore throat; pain or trouble passing urine  signs and symptoms of kidney injury like trouble passing urine or change in the amount of urine  signs and symptoms of liver injury like dark yellow or brown urine; general ill feeling or flu-like symptoms; light-colored stools; loss of appetite; nausea; right upper belly pain; unusually weak or tired; yellowing of the eyes or skin Side effects that usually do not require medical attention (report to your doctor or health care professional if they continue or are bothersome):  constipation  mouth sores  nausea, vomiting  unusually weak or tired This list may not describe all possible side effects. Call your doctor for medical advice about side effects. You may report  side effects to FDA at 1-800-FDA-1088. Where should I keep my medicine? This drug is given in a hospital or clinic and will not be stored at home. NOTE: This sheet is a summary. It may not cover all possible information. If you have questions about this medicine, talk to your doctor, pharmacist, or health care provider.  2020 Elsevier/Gold Standard (2017-09-05 16:11:33)  Carboplatin injection What is this medicine? CARBOPLATIN (KAR boe pla tin) is a chemotherapy drug. It targets fast dividing cells, like cancer cells, and causes these cells to die. This medicine is used to treat ovarian cancer and many other cancers. This medicine may be used for other purposes; ask your health care provider or pharmacist if you have questions. COMMON BRAND NAME(S): Paraplatin What should I tell my health care provider before I take this medicine? They need to know if you have any of these conditions:  blood disorders  hearing problems  kidney disease  recent or ongoing radiation therapy  an unusual or allergic reaction to carboplatin, cisplatin, other chemotherapy, other medicines, foods, dyes, or preservatives  pregnant or trying to get pregnant  breast-feeding How should I use this medicine? This drug is usually given as an infusion into a vein. It is administered in a hospital or clinic by a specially trained health care professional. Talk to your pediatrician regarding the use of this medicine in children. Special care may be needed. Overdosage: If you think you have taken too much of this medicine contact a poison control center or emergency room at once. NOTE: This medicine is only for you. Do not share this medicine with others. What if I miss a dose? It is important not to miss a dose. Call your doctor or health care professional if you are unable to keep an appointment. What may interact with this medicine?  medicines for seizures  medicines to increase blood counts like filgrastim,  pegfilgrastim, sargramostim  some antibiotics like amikacin, gentamicin, neomycin, streptomycin, tobramycin  vaccines Talk to your doctor or health care professional before taking any of these medicines:  acetaminophen  aspirin  ibuprofen  ketoprofen  naproxen This list may not describe all possible interactions. Give your health care provider a list of all the medicines, herbs, non-prescription drugs, or dietary supplements you use. Also tell them if you smoke, drink alcohol, or use illegal drugs. Some items may interact with your medicine. What should I watch for while using this medicine? Your condition will be monitored carefully while you are receiving this medicine. You will need important blood work done while you are taking this medicine. This drug may make you feel generally unwell. This is not  uncommon, as chemotherapy can affect healthy cells as well as cancer cells. Report any side effects. Continue your course of treatment even though you feel ill unless your doctor tells you to stop. In some cases, you may be given additional medicines to help with side effects. Follow all directions for their use. Call your doctor or health care professional for advice if you get a fever, chills or sore throat, or other symptoms of a cold or flu. Do not treat yourself. This drug decreases your body's ability to fight infections. Try to avoid being around people who are sick. This medicine may increase your risk to bruise or bleed. Call your doctor or health care professional if you notice any unusual bleeding. Be careful brushing and flossing your teeth or using a toothpick because you may get an infection or bleed more easily. If you have any dental work done, tell your dentist you are receiving this medicine. Avoid taking products that contain aspirin, acetaminophen, ibuprofen, naproxen, or ketoprofen unless instructed by your doctor. These medicines may hide a fever. Do not become pregnant  while taking this medicine. Women should inform their doctor if they wish to become pregnant or think they might be pregnant. There is a potential for serious side effects to an unborn child. Talk to your health care professional or pharmacist for more information. Do not breast-feed an infant while taking this medicine. What side effects may I notice from receiving this medicine? Side effects that you should report to your doctor or health care professional as soon as possible:  allergic reactions like skin rash, itching or hives, swelling of the face, lips, or tongue  signs of infection - fever or chills, cough, sore throat, pain or difficulty passing urine  signs of decreased platelets or bleeding - bruising, pinpoint red spots on the skin, black, tarry stools, nosebleeds  signs of decreased red blood cells - unusually weak or tired, fainting spells, lightheadedness  breathing problems  changes in hearing  changes in vision  chest pain  high blood pressure  low blood counts - This drug may decrease the number of white blood cells, red blood cells and platelets. You may be at increased risk for infections and bleeding.  nausea and vomiting  pain, swelling, redness or irritation at the injection site  pain, tingling, numbness in the hands or feet  problems with balance, talking, walking  trouble passing urine or change in the amount of urine Side effects that usually do not require medical attention (report to your doctor or health care professional if they continue or are bothersome):  hair loss  loss of appetite  metallic taste in the mouth or changes in taste This list may not describe all possible side effects. Call your doctor for medical advice about side effects. You may report side effects to FDA at 1-800-FDA-1088. Where should I keep my medicine? This drug is given in a hospital or clinic and will not be stored at home. NOTE: This sheet is a summary. It may not  cover all possible information. If you have questions about this medicine, talk to your doctor, pharmacist, or health care provider.  2020 Elsevier/Gold Standard (2007-10-22 14:38:05)

## 2019-06-06 NOTE — Progress Notes (Addendum)
[facility] OFFICE PROGRESS NOTE   Diagnosis: Non-small cell lung cancer  INTERVAL HISTORY:   [name] returns as scheduled.  Port site is "tender".  She continues to have left hip/groin pain.  She also has chronic right hip pain.  No nausea or vomiting.  No diarrhea.  No skin rash.  No shortness of breath.  Objective:  Vital signs in last 24 hours:  Blood pressure 139/70, pulse (!) 58, temperature 98 F (36.7 C), temperature source Temporal, resp. rate 18, height 5\' 4"  (1.626 m), weight 174 lb 3.2 oz (79 kg), SpO2 97 %.    HEENT: No thrush or ulcers. GI: Abdomen soft and nontender.  No hepatomegaly. Vascular: No leg edema. Neuro: Alert and oriented. Skin: No rash. Port-A-Cath without erythema.   Lab Results:  Lab Results  Component Value Date   WBC 6.7 06/06/2019   HGB 11.4 (L) 06/06/2019   HCT 34.7 (L) 06/06/2019   MCV 93.0 06/06/2019   PLT 210 06/06/2019   NEUTROABS 4.1 06/06/2019    Imaging:  Ir Imaging Guided Port Insertion  Result Date: 06/04/2019 CLINICAL DATA:  Lung carcinoma, needs durable venous access for chemotherapy regimen. EXAM: TUNNELED PORT CATHETER PLACEMENT WITH ULTRASOUND AND FLUOROSCOPIC GUIDANCE FLUOROSCOPY TIME:  0.5 minute; 72  uGym2 DAP ANESTHESIA/SEDATION: Intravenous Fentanyl 14mcg and Versed 1.5mg  were administered as conscious sedation during continuous monitoring of the patient's level of consciousness and physiological / cardiorespiratory status by the radiology RN, with a total moderate sedation time of 19 minutes. TECHNIQUE: The procedure, risks, benefits, and alternatives were explained to the patient. Questions regarding the procedure were encouraged and answered. The patient understands and consents to the procedure. As antibiotic prophylaxis, cefazolin 2 g was ordered pre-procedure and administered intravenously within one hour of incision. Patency of the right IJ vein was confirmed with ultrasound with image  documentation. An appropriate skin site was determined. Skin site was marked. Region was prepped using maximum barrier technique including cap and mask, sterile gown, sterile gloves, large sterile sheet, and Chlorhexidine as cutaneous antisepsis. The region was infiltrated locally with 1% lidocaine. Under real-time ultrasound guidance, the right IJ vein was accessed with a 21 gauge micropuncture needle; the needle tip within the vein was confirmed with ultrasound image documentation. Needle was exchanged over a 018 guidewire for transitional dilator, and vascular measurement was performed. A small incision was made on the right anterior chest wall and a subcutaneous pocket fashioned. The power-injectable port was positioned and its catheter tunneled to the right IJ dermatotomy site. The transitional dilator was exchanged over an Amplatz wire for a peel-away sheath, through which the port catheter, which had been trimmed to the appropriate length, was advanced and positioned under fluoroscopy with its tip at the cavoatrial junction. Spot chest radiograph confirms good catheter position and no pneumothorax. The port was flushed per protocol. The pocket was closed with deep interrupted and subcuticular continuous 3-0 Monocryl sutures. The incisions were covered with Dermabond then covered with a sterile dressing. The patient tolerated the procedure well. COMPLICATIONS: COMPLICATIONS None immediate IMPRESSION: Technically successful right IJ power-injectable port catheter placement. Ready for routine use. Electronically Signed   By: Lucrezia Europe M.D.   On: 06/04/2019 13:26    Medications: I have reviewed the patient's current medications.  Assessment/Plan: 1. Non-small cell lung cancer  MRI lumbar spine 04/29/2019-enlarging marrow lesions involving the L1 vertebral body, upper left sacrum and right iliac bone  MRI pelvis 04/29/2019-3.5 cm left iliac bone lesion appears slightly  larger; other similar appearing  lesions present within the left superior pubic ramus, left superior abdomen acetabulum and upper left sacrum  Kappa free light chains with mild elevation 05/12/2019  CTs10/07/2019-left lower lobe pulmonary mass 3.3 x 3.2 cm; lytic process left iliac bone; spinal lesions; 1.1 cm low-density left kidney lesion; right thyroid enlargement with heterogeneous appearance with potential for multiple discrete lesions  Biopsy left lower lobe lung mass 05/26/2019-poorly differentiated carcinoma; positive for cytokeratin 5/6, p63 and TTF-1  Cycle 1 carboplatin/Alimta/pembrolizumab 06/06/2019 2. Pain secondary to #1 3. Chronic back pain 4. Type 2 diabetes 5. Essential hypertension 6. CAD 7. Hyperlipidemia 8. Family history significant for multiple members with breast cancer  Disposition: [name] appears unchanged.  She is scheduled for cycle 1 carboplatin/Alimta/pembrolizumab today. We again reviewed potential toxicities.  She agrees to proceed.  CBC and chemistry panel from today reviewed, adequate to proceed.  She will return for lab, follow-up, cycle 2 carboplatin/Alimta/pembrolizumab in 3 weeks.  She will contact the office in the interim with any problems.  Patient seen with Dr. Benay Spice.  Ned Card ANP/GNP-BC   06/06/2019  11:52 AM This was a shared visit with Ned Card.  [name] underwent Port-A-Cath placement and will complete cycle 1 systemic therapy today.  We are waiting on results of molecular testing.  Julieanne Manson, MD

## 2019-06-09 ENCOUNTER — Encounter: Payer: Self-pay | Admitting: Oncology

## 2019-06-09 ENCOUNTER — Telehealth: Payer: Self-pay | Admitting: Oncology

## 2019-06-09 ENCOUNTER — Telehealth: Payer: Self-pay | Admitting: *Deleted

## 2019-06-09 NOTE — Telephone Encounter (Signed)
Scheduled per los. Called and spoke with patient. Confirmed appt 

## 2019-06-09 NOTE — Telephone Encounter (Signed)
Called pt to check on her post chemotherapy.  She reports feeling some fatigue which is some better & also some constipation.  She took miralax & is doing better with that also.  Informed that she may need to take Miralax/stool softener prior to next treatment.  Pt reports understanding side effects & knows how to reach [place] with concerns/questions.

## 2019-06-09 NOTE — Telephone Encounter (Signed)
-----   Message from Ishmael Holter, RN sent at 06/06/2019  3:49 PM EST ----- Regarding: Dr. Benay Spice 1st TX F/U  call Dr. Benay Spice 1st tx f/u call

## 2019-06-09 NOTE — Progress Notes (Signed)
Spoke w/ pt and informed her unfortunately there aren't any foundations offering copay assistance for her Dx and the type of ins she has.  I informed her of the [place], went over what it covers and gave her the income requirement.  She stated she is above the requirement so she doesn't qualify at this time.

## 2019-06-10 ENCOUNTER — Other Ambulatory Visit: Payer: Self-pay | Admitting: *Deleted

## 2019-06-10 ENCOUNTER — Telehealth: Payer: Self-pay | Admitting: *Deleted

## 2019-06-10 MED ORDER — ONDANSETRON HCL 8 MG PO TABS: 8.0000 mg | ORAL_TABLET | Freq: Three times a day (TID) | ORAL | 1 refills | Status: DC | PRN

## 2019-06-10 NOTE — Telephone Encounter (Signed)
Per Ned Card, NP, called pt to make aware to discontinue compazine due to interaction with requip. Zofran 8 mg was called into pt preferred pharmacy. Pt verbalized understanding and if any other concerns arise, pt will call office

## 2019-06-10 NOTE — Telephone Encounter (Signed)
-----   Message from Owens Shark, NP sent at 06/10/2019  9:50 AM EST ----- Please call her with instructions to discontinue Compazine.  There is a potential interaction between Compazine and Requip.  Please send a prescription to her pharmacy for Zofran 8 mg every 8 hours as needed for nausea/vomiting.  Thanks

## 2019-06-10 NOTE — Telephone Encounter (Signed)
Error

## 2019-06-10 NOTE — Telephone Encounter (Signed)
Per Ned Card, NP, called pt and made aware to discontinue compazine because of the interaction b/w compazine and requip. Pt verbalized understanding. Also made pt aware that Zofran 8 mg should be used in place of compazine for nausea/vomiting and new prescription was sent to her preferred pharmacy. Pt understood.

## 2019-06-11 ENCOUNTER — Telehealth: Payer: Self-pay | Admitting: *Deleted

## 2019-06-11 NOTE — Telephone Encounter (Signed)
Called to request office complete FMLA forms for her daughter to be able to assist with care/transportaion of her and her husband. Informed patient that we do not supply the FMLA forms. These need to come from the daughter's employer. Once received it may take 7-10 business days to complete.

## 2019-06-14 ENCOUNTER — Other Ambulatory Visit: Payer: Self-pay | Admitting: Cardiovascular Disease

## 2019-06-20 ENCOUNTER — Other Ambulatory Visit: Payer: Self-pay | Admitting: Nurse Practitioner

## 2019-06-20 ENCOUNTER — Encounter: Payer: Self-pay | Admitting: Nurse Practitioner

## 2019-06-20 ENCOUNTER — Encounter: Payer: Self-pay | Admitting: Oncology

## 2019-06-22 ENCOUNTER — Other Ambulatory Visit: Payer: Self-pay | Admitting: Oncology

## 2019-06-27 ENCOUNTER — Other Ambulatory Visit: Payer: Self-pay

## 2019-06-27 ENCOUNTER — Inpatient Hospital Stay: Payer: Medicare Other

## 2019-06-27 ENCOUNTER — Inpatient Hospital Stay (HOSPITAL_BASED_OUTPATIENT_CLINIC_OR_DEPARTMENT_OTHER): Payer: Medicare Other | Admitting: Oncology

## 2019-06-27 VITALS — BP 126/59 | HR 60 | Temp 98.0°F | Resp 18 | Ht 64.0 in | Wt 176.1 lb

## 2019-06-27 DIAGNOSIS — C3492 Malignant neoplasm of unspecified part of left bronchus or lung: Secondary | ICD-10-CM

## 2019-06-27 DIAGNOSIS — Z95828 Presence of other vascular implants and grafts: Secondary | ICD-10-CM

## 2019-06-27 DIAGNOSIS — Z5112 Encounter for antineoplastic immunotherapy: Secondary | ICD-10-CM | POA: Diagnosis not present

## 2019-06-27 LAB — CMP (CANCER CENTER ONLY)
ALT: 19 U/L (ref 0–44)
AST: 20 U/L (ref 15–41)
Albumin: 3.7 g/dL (ref 3.5–5.0)
Alkaline Phosphatase: 84 U/L (ref 38–126)
Anion gap: 9 (ref 5–15)
BUN: 23 mg/dL (ref 8–23)
CO2: 23 mmol/L (ref 22–32)
Calcium: 9.3 mg/dL (ref 8.9–10.3)
Chloride: 107 mmol/L (ref 98–111)
Creatinine: 0.82 mg/dL (ref 0.44–1.00)
GFR, Est AFR Am: >60 mL/min (ref >60)
GFR, Estimated: >60 mL/min (ref >60)
Glucose, Bld: 105 mg/dL — ABNORMAL HIGH (ref 70–99)
Potassium: 4.7 mmol/L (ref 3.5–5.1)
Sodium: 139 mmol/L (ref 135–145)
Total Bilirubin: 0.2 mg/dL — ABNORMAL LOW (ref 0.3–1.2)
Total Protein: 6.6 g/dL (ref 6.5–8.1)

## 2019-06-27 LAB — CBC WITH DIFFERENTIAL (CANCER CENTER ONLY)
Abs Immature Granulocytes: 0.01 10*3/uL (ref 0.00–0.07)
Basophils Absolute: 0 10*3/uL (ref 0.0–0.1)
Basophils Relative: 0 %
Eosinophils Absolute: 0.1 10*3/uL (ref 0.0–0.5)
Eosinophils Relative: 3 %
HCT: 32.4 % — ABNORMAL LOW (ref 36.0–46.0)
Hemoglobin: 10.5 g/dL — ABNORMAL LOW (ref 12.0–15.0)
Immature Granulocytes: 0 %
Lymphocytes Relative: 33 %
Lymphs Abs: 1.2 10*3/uL (ref 0.7–4.0)
MCH: 30.8 pg (ref 26.0–34.0)
MCHC: 32.4 g/dL (ref 30.0–36.0)
MCV: 95 fL (ref 80.0–100.0)
Monocytes Absolute: 0.5 10*3/uL (ref 0.1–1.0)
Monocytes Relative: 15 %
Neutro Abs: 1.8 10*3/uL (ref 1.7–7.7)
Neutrophils Relative %: 49 %
Platelet Count: 240 10*3/uL (ref 150–400)
RBC: 3.41 MIL/uL — ABNORMAL LOW (ref 3.87–5.11)
RDW: 13.7 % (ref 11.5–15.5)
WBC Count: 3.7 10*3/uL — ABNORMAL LOW (ref 4.0–10.5)
nRBC: 0 % (ref 0.0–0.2)

## 2019-06-27 LAB — TSH: TSH: 0.545 u[IU]/mL (ref 0.308–3.960)

## 2019-06-27 MED ORDER — SODIUM CHLORIDE 0.9 % IV SOLN
200.0000 mg | Freq: Once | INTRAVENOUS | Status: AC
  Administered 2019-06-27: 200 mg via INTRAVENOUS
  Filled 2019-06-27: qty 8

## 2019-06-27 MED ORDER — SODIUM CHLORIDE 0.9 % IV SOLN
Freq: Once | INTRAVENOUS | Status: AC
  Administered 2019-06-27: 10:00:00 via INTRAVENOUS
  Filled 2019-06-27: qty 250

## 2019-06-27 MED ORDER — HEPARIN SOD (PORK) LOCK FLUSH 100 UNIT/ML IV SOLN
500.0000 [IU] | Freq: Once | INTRAVENOUS | Status: AC | PRN
  Administered 2019-06-27: 500 [IU]
  Filled 2019-06-27: qty 5

## 2019-06-27 MED ORDER — SODIUM CHLORIDE 0.9% FLUSH
10.0000 mL | INTRAVENOUS | Status: DC | PRN
  Administered 2019-06-27: 10 mL via INTRAVENOUS
  Filled 2019-06-27: qty 10

## 2019-06-27 MED ORDER — PALONOSETRON HCL INJECTION 0.25 MG/5ML
0.2500 mg | Freq: Once | INTRAVENOUS | Status: AC
  Administered 2019-06-27: 0.25 mg via INTRAVENOUS

## 2019-06-27 MED ORDER — SODIUM CHLORIDE 0.9% FLUSH
10.0000 mL | INTRAVENOUS | Status: DC | PRN
  Administered 2019-06-27: 10 mL
  Filled 2019-06-27: qty 10

## 2019-06-27 MED ORDER — SODIUM CHLORIDE 0.9 % IV SOLN
Freq: Once | INTRAVENOUS | Status: AC
  Administered 2019-06-27: 11:00:00 via INTRAVENOUS
  Filled 2019-06-27: qty 5

## 2019-06-27 MED ORDER — PALONOSETRON HCL INJECTION 0.25 MG/5ML
INTRAVENOUS | Status: AC
  Filled 2019-06-27: qty 5

## 2019-06-27 MED ORDER — SODIUM CHLORIDE 0.9 % IV SOLN
530.0000 mg/m2 | Freq: Once | INTRAVENOUS | Status: AC
  Administered 2019-06-27: 1000 mg via INTRAVENOUS
  Filled 2019-06-27: qty 40

## 2019-06-27 MED ORDER — SODIUM CHLORIDE 0.9 % IV SOLN
395.0000 mg | Freq: Once | INTRAVENOUS | Status: AC
  Administered 2019-06-27: 400 mg via INTRAVENOUS
  Filled 2019-06-27: qty 40

## 2019-06-27 NOTE — Patient Instructions (Signed)
[facility] Discharge Instructions for Patients Receiving Chemotherapy  Today you received the following chemotherapy agents:  Pemetrexed, Pembrolizumab, Carboplatin  To help prevent nausea and vomiting after your treatment, we encourage you to take your nausea medication as prescribed.   If you develop nausea and vomiting that is not controlled by your nausea medication, call the clinic.   BELOW ARE SYMPTOMS THAT SHOULD BE REPORTED IMMEDIATELY:  *FEVER GREATER THAN 100.5 F  *CHILLS WITH OR WITHOUT FEVER  NAUSEA AND VOMITING THAT IS NOT CONTROLLED WITH YOUR NAUSEA MEDICATION  *UNUSUAL SHORTNESS OF BREATH  *UNUSUAL BRUISING OR BLEEDING  TENDERNESS IN MOUTH AND THROAT WITH OR WITHOUT PRESENCE OF ULCERS  *URINARY PROBLEMS  *BOWEL PROBLEMS  UNUSUAL RASH Items with * indicate a potential emergency and should be followed up as soon as possible.  Feel free to call the clinic should you have any questions or concerns. The clinic phone number is [phone].  Please show the [facility] at check-in to the Emergency Department and triage nurse.

## 2019-06-27 NOTE — Patient Instructions (Signed)

## 2019-06-27 NOTE — Progress Notes (Signed)
  [facility] OFFICE PROGRESS NOTE   Diagnosis: Non-small cell lung cancer  INTERVAL HISTORY:   [name] completed cycle 1 chemotherapy on 06/06/2019.  She reports mild nausea following chemotherapy.  No emesis.  No rash or diarrhea.  She continues to have pain at the "hip "and mid back.  The pain is relieved with Tylenol and naproxen. She had upper airway congestion with sinus and ear congestion last week.  She also reports soreness at the roof of the mouth.  Objective:  Vital signs in last 24 hours:  Blood pressure (!) 126/59, pulse 60, temperature 98 F (36.7 C), temperature source Temporal, resp. rate 18, height 5\' 4"  (1.626 m), weight 176 lb 1.6 oz (79.9 kg), SpO2 97 %.    HEENT: No thrush or ulcerations. GI: No hepatomegaly, nontender Vascular: No leg edema, the left lower leg is larger than the right side Musculoskeletal: Mild pain with motion of the left hip, mild tenderness at the mid back Skin: No rash  Portacath/PICC-without erythema  Lab Results:  Lab Results  Component Value Date   WBC 3.7 (L) 06/27/2019   HGB 10.5 (L) 06/27/2019   HCT 32.4 (L) 06/27/2019   MCV 95.0 06/27/2019   PLT 240 06/27/2019   NEUTROABS 1.8 06/27/2019    CMP  Lab Results  Component Value Date   NA 139 06/06/2019   K 5.1 06/06/2019   CL 106 06/06/2019   CO2 24 06/06/2019   GLUCOSE 95 06/06/2019   BUN 24 (H) 06/06/2019   CREATININE 0.89 06/06/2019   CALCIUM 9.5 06/06/2019   PROT 7.4 06/06/2019   ALBUMIN 4.1 06/06/2019   AST 17 06/06/2019   ALT 13 06/06/2019   ALKPHOS 78 06/06/2019   BILITOT 0.3 06/06/2019   GFRNONAA >60 06/06/2019   GFRAA >60 06/06/2019     Medications: I have reviewed the patient's current medications.   Assessment/Plan: 1. Non-small cell lung cancer  MRI lumbar spine 04/29/2019-enlarging marrow lesions involving the L1 vertebral body, upper left sacrum and right iliac bone  MRI pelvis 04/29/2019-3.5 cm left iliac bone lesion  appears slightly larger; other similar appearing lesions present within the left superior pubic ramus, left superior abdomen acetabulum and upper left sacrum  Kappa free light chains with mild elevation 05/12/2019  CTs10/07/2019-left lower lobe pulmonary mass 3.3 x 3.2 cm; lytic process left iliac bone; spinal lesions; 1.1 cm low-density left kidney lesion; right thyroid enlargement with heterogeneous appearance with potential for multiple discrete lesions  Biopsy left lower lobe lung mass 05/26/2019-poorly differentiated carcinoma; positive for cytokeratin 5/6, p63 and TTF-1  Cycle 1 carboplatin/Alimta/pembrolizumab 06/06/2019  Cycle 2 carboplatin/Alimta/pembrolizumab 06/27/2019 2. Pain secondary to #1 3. Chronic back pain 4. Type 2 diabetes 5. Essential hypertension 6. CAD 7. Hyperlipidemia 8. Family history significant for multiple members with breast cancer    Disposition: [name] appears stable.  She tolerated the first cycle of chemotherapy well.  She has borderline neutropenia today.  She will return for a nadir CBC in approximately 10 days.  [name] will complete cycle 2 chemotherapy today.  She will return for an office visit and cycle 3 in 3 weeks.  We will follow-up on results of molecular testing on the lung mass biopsy.  [name], MD  06/27/2019  9:17 AM

## 2019-06-30 ENCOUNTER — Telehealth: Payer: Self-pay | Admitting: Oncology

## 2019-06-30 NOTE — Telephone Encounter (Signed)
Scheduled per los. Called and spoke with patient. Confirmed appts  

## 2019-07-04 ENCOUNTER — Encounter: Payer: Self-pay | Admitting: Oncology

## 2019-07-04 ENCOUNTER — Telehealth: Payer: Self-pay | Admitting: *Deleted

## 2019-07-04 MED ORDER — CIPROFLOXACIN HCL 500 MG PO TABS: 500.0000 mg | ORAL_TABLET | Freq: Two times a day (BID) | ORAL | 0 refills | Status: DC

## 2019-07-04 NOTE — Telephone Encounter (Signed)
After receipt of Mychart messages w/symtoms of UTI, Dr. Benay Spice has ordered Cipro 500 mg bid X 5 days. Patient notified and encouraged her to push fluids as well. Call if symptoms do not improved. Instructed her to call instead of Mychart of any s/s of infection. She agrees to do so.

## 2019-07-08 ENCOUNTER — Encounter: Payer: Self-pay | Admitting: Oncology

## 2019-07-09 ENCOUNTER — Inpatient Hospital Stay: Payer: Medicare Other | Attending: Oncology

## 2019-07-09 ENCOUNTER — Other Ambulatory Visit (HOSPITAL_COMMUNITY)
Admission: RE | Admit: 2019-07-09 | Discharge: 2019-07-09 | Disposition: A | Discharge status: Home / Self Care | Payer: Medicare Other | Source: Ambulatory Visit | Attending: Oncology | Admitting: Oncology

## 2019-07-09 ENCOUNTER — Other Ambulatory Visit: Payer: Self-pay

## 2019-07-09 ENCOUNTER — Telehealth: Payer: Self-pay | Admitting: *Deleted

## 2019-07-09 DIAGNOSIS — Z5111 Encounter for antineoplastic chemotherapy: Secondary | ICD-10-CM | POA: Insufficient documentation

## 2019-07-09 DIAGNOSIS — Z5112 Encounter for antineoplastic immunotherapy: Secondary | ICD-10-CM | POA: Diagnosis present

## 2019-07-09 DIAGNOSIS — C3432 Malignant neoplasm of lower lobe, left bronchus or lung: Secondary | ICD-10-CM | POA: Insufficient documentation

## 2019-07-09 DIAGNOSIS — Z79899 Other long term (current) drug therapy: Secondary | ICD-10-CM | POA: Diagnosis not present

## 2019-07-09 DIAGNOSIS — R918 Other nonspecific abnormal finding of lung field: Secondary | ICD-10-CM | POA: Diagnosis present

## 2019-07-09 DIAGNOSIS — C3492 Malignant neoplasm of unspecified part of left bronchus or lung: Secondary | ICD-10-CM

## 2019-07-09 LAB — CBC WITH DIFFERENTIAL (CANCER CENTER ONLY)
Abs Immature Granulocytes: 0 10*3/uL (ref 0.00–0.07)
Basophils Absolute: 0 10*3/uL (ref 0.0–0.1)
Basophils Relative: 0 %
Eosinophils Absolute: 0.1 10*3/uL (ref 0.0–0.5)
Eosinophils Relative: 2 %
HCT: 30.8 % — ABNORMAL LOW (ref 36.0–46.0)
Hemoglobin: 10.2 g/dL — ABNORMAL LOW (ref 12.0–15.0)
Immature Granulocytes: 0 %
Lymphocytes Relative: 52 %
Lymphs Abs: 1.4 10*3/uL (ref 0.7–4.0)
MCH: 31.3 pg (ref 26.0–34.0)
MCHC: 33.1 g/dL (ref 30.0–36.0)
MCV: 94.5 fL (ref 80.0–100.0)
Monocytes Absolute: 0.4 10*3/uL (ref 0.1–1.0)
Monocytes Relative: 16 %
Neutro Abs: 0.8 10*3/uL — ABNORMAL LOW (ref 1.7–7.7)
Neutrophils Relative %: 30 %
Platelet Count: 98 10*3/uL — ABNORMAL LOW (ref 150–400)
RBC: 3.26 MIL/uL — ABNORMAL LOW (ref 3.87–5.11)
RDW: 13.6 % (ref 11.5–15.5)
WBC Count: 2.7 10*3/uL — ABNORMAL LOW (ref 4.0–10.5)
nRBC: 0 % (ref 0.0–0.2)

## 2019-07-09 NOTE — Telephone Encounter (Signed)
-----   Message from Ladell Pier, MD sent at 07/09/2019  1:47 PM EST ----- Please call patient, platelets and neutrophils are mildly decreased, call for fever or bleeding, follow-up as scheduled  Also please check status of her molecular testing

## 2019-07-09 NOTE — Telephone Encounter (Signed)
LM with note below 

## 2019-07-10 ENCOUNTER — Other Ambulatory Visit: Payer: Medicare Other

## 2019-07-10 ENCOUNTER — Ambulatory Visit: Payer: Medicare Other

## 2019-07-10 ENCOUNTER — Ambulatory Visit: Payer: Medicare Other | Admitting: Nurse Practitioner

## 2019-07-13 ENCOUNTER — Other Ambulatory Visit: Payer: Self-pay | Admitting: Oncology

## 2019-07-14 ENCOUNTER — Telehealth: Payer: Self-pay | Admitting: *Deleted

## 2019-07-14 ENCOUNTER — Other Ambulatory Visit: Payer: Self-pay

## 2019-07-14 ENCOUNTER — Inpatient Hospital Stay: Payer: Medicare Other

## 2019-07-14 DIAGNOSIS — R300 Dysuria: Secondary | ICD-10-CM

## 2019-07-14 DIAGNOSIS — Z5112 Encounter for antineoplastic immunotherapy: Secondary | ICD-10-CM | POA: Diagnosis not present

## 2019-07-14 DIAGNOSIS — C3492 Malignant neoplasm of unspecified part of left bronchus or lung: Secondary | ICD-10-CM

## 2019-07-14 LAB — URINALYSIS, COMPLETE (UACMP) WITH MICROSCOPIC
Bilirubin Urine: NEGATIVE
Glucose, UA: NEGATIVE mg/dL
Hgb urine dipstick: NEGATIVE
Ketones, ur: NEGATIVE mg/dL
Leukocytes,Ua: NEGATIVE
Nitrite: NEGATIVE
Protein, ur: NEGATIVE mg/dL
Specific Gravity, Urine: 1.012 (ref 1.005–1.030)
pH: 5 (ref 5.0–8.0)

## 2019-07-14 LAB — CBC WITH DIFFERENTIAL (CANCER CENTER ONLY)
Abs Immature Granulocytes: 0.02 10*3/uL (ref 0.00–0.07)
Basophils Absolute: 0 10*3/uL (ref 0.0–0.1)
Basophils Relative: 0 %
Eosinophils Absolute: 0.1 10*3/uL (ref 0.0–0.5)
Eosinophils Relative: 3 %
HCT: 33.1 % — ABNORMAL LOW (ref 36.0–46.0)
Hemoglobin: 10.8 g/dL — ABNORMAL LOW (ref 12.0–15.0)
Immature Granulocytes: 1 %
Lymphocytes Relative: 36 %
Lymphs Abs: 1.3 10*3/uL (ref 0.7–4.0)
MCH: 31.2 pg (ref 26.0–34.0)
MCHC: 32.6 g/dL (ref 30.0–36.0)
MCV: 95.7 fL (ref 80.0–100.0)
Monocytes Absolute: 0.5 10*3/uL (ref 0.1–1.0)
Monocytes Relative: 13 %
Neutro Abs: 1.7 10*3/uL (ref 1.7–7.7)
Neutrophils Relative %: 47 %
Platelet Count: 174 10*3/uL (ref 150–400)
RBC: 3.46 MIL/uL — ABNORMAL LOW (ref 3.87–5.11)
RDW: 14.8 % (ref 11.5–15.5)
WBC Count: 3.6 10*3/uL — ABNORMAL LOW (ref 4.0–10.5)
nRBC: 0 % (ref 0.0–0.2)

## 2019-07-14 NOTE — Telephone Encounter (Addendum)
Has developed what she calls "spotches" on her arms and few on legs. Range from size of dime to pinpoint. Red in color-no itching and flat. Also reports despite the course of Cipro she still has urinary frequency, poor urine stream and some burning with urination. Per Dr. Benay Spice: Have her come in for labs (CBC and UA/culture) and wait for RN to look at the areas of concern. Patient notified and agrees to come in at 1 pm today.

## 2019-07-14 NOTE — Telephone Encounter (Addendum)
Assessed areas on arms and one area on leg after CBC drawn. Dose not appear to be petechia, appears to be a mild rash on arms. Dr. Benay Spice looked as well. Most likely a rash beginning from pembrolizumab. CBC results were improve from last week w/platelets in normal range. Patient notified and will be called w/urinalysis results. Left VM that U/A shows bacteria, but no other indications of UTI. MD wants to wait on culture result before ordering antibiotic.

## 2019-07-16 ENCOUNTER — Telehealth: Payer: Self-pay | Admitting: *Deleted

## 2019-07-16 LAB — URINE CULTURE: Culture: 10000 — AB

## 2019-07-16 NOTE — Telephone Encounter (Signed)
MD review of urine culture--due to low colony count and no WBC in urine, will hold off on antibiotic at this time. Push po fluids. Follow up as scheduled. Patient notified.

## 2019-07-17 ENCOUNTER — Other Ambulatory Visit: Payer: Self-pay | Admitting: Oncology

## 2019-07-17 MED ORDER — CEPHALEXIN 500 MG PO CAPS: 500.0000 mg | ORAL_CAPSULE | Freq: Three times a day (TID) | ORAL | 0 refills | Status: AC

## 2019-07-18 ENCOUNTER — Inpatient Hospital Stay: Payer: Medicare Other

## 2019-07-18 ENCOUNTER — Inpatient Hospital Stay (HOSPITAL_BASED_OUTPATIENT_CLINIC_OR_DEPARTMENT_OTHER): Payer: Medicare Other | Admitting: Nurse Practitioner

## 2019-07-18 ENCOUNTER — Other Ambulatory Visit: Payer: Self-pay

## 2019-07-18 ENCOUNTER — Encounter: Payer: Self-pay | Admitting: Nurse Practitioner

## 2019-07-18 VITALS — BP 142/54 | HR 62 | Temp 98.0°F | Resp 17 | Ht 64.0 in | Wt 176.6 lb

## 2019-07-18 DIAGNOSIS — Z5112 Encounter for antineoplastic immunotherapy: Secondary | ICD-10-CM | POA: Diagnosis not present

## 2019-07-18 DIAGNOSIS — C3492 Malignant neoplasm of unspecified part of left bronchus or lung: Secondary | ICD-10-CM

## 2019-07-18 DIAGNOSIS — Z95828 Presence of other vascular implants and grafts: Secondary | ICD-10-CM

## 2019-07-18 LAB — CBC WITH DIFFERENTIAL (CANCER CENTER ONLY)
Abs Immature Granulocytes: 0.01 10*3/uL (ref 0.00–0.07)
Basophils Absolute: 0 10*3/uL (ref 0.0–0.1)
Basophils Relative: 0 %
Eosinophils Absolute: 0.1 10*3/uL (ref 0.0–0.5)
Eosinophils Relative: 2 %
HCT: 31.7 % — ABNORMAL LOW (ref 36.0–46.0)
Hemoglobin: 10.4 g/dL — ABNORMAL LOW (ref 12.0–15.0)
Immature Granulocytes: 0 %
Lymphocytes Relative: 38 %
Lymphs Abs: 1.5 10*3/uL (ref 0.7–4.0)
MCH: 31.7 pg (ref 26.0–34.0)
MCHC: 32.8 g/dL (ref 30.0–36.0)
MCV: 96.6 fL (ref 80.0–100.0)
Monocytes Absolute: 0.4 10*3/uL (ref 0.1–1.0)
Monocytes Relative: 11 %
Neutro Abs: 1.9 10*3/uL (ref 1.7–7.7)
Neutrophils Relative %: 49 %
Platelet Count: 201 10*3/uL (ref 150–400)
RBC: 3.28 MIL/uL — ABNORMAL LOW (ref 3.87–5.11)
RDW: 15.4 % (ref 11.5–15.5)
WBC Count: 3.9 10*3/uL — ABNORMAL LOW (ref 4.0–10.5)
nRBC: 0 % (ref 0.0–0.2)

## 2019-07-18 LAB — CMP (CANCER CENTER ONLY)
ALT: 22 U/L (ref 0–44)
AST: 23 U/L (ref 15–41)
Albumin: 3.9 g/dL (ref 3.5–5.0)
Alkaline Phosphatase: 75 U/L (ref 38–126)
Anion gap: 9 (ref 5–15)
BUN: 19 mg/dL (ref 8–23)
CO2: 25 mmol/L (ref 22–32)
Calcium: 9 mg/dL (ref 8.9–10.3)
Chloride: 105 mmol/L (ref 98–111)
Creatinine: 0.92 mg/dL (ref 0.44–1.00)
GFR, Est AFR Am: >60 mL/min (ref >60)
GFR, Estimated: 58 mL/min — ABNORMAL LOW (ref >60)
Glucose, Bld: 113 mg/dL — ABNORMAL HIGH (ref 70–99)
Potassium: 4.2 mmol/L (ref 3.5–5.1)
Sodium: 139 mmol/L (ref 135–145)
Total Bilirubin: <0.2 mg/dL — ABNORMAL LOW (ref 0.3–1.2)
Total Protein: 6.7 g/dL (ref 6.5–8.1)

## 2019-07-18 MED ORDER — SODIUM CHLORIDE 0.9 % IV SOLN
530.0000 mg/m2 | Freq: Once | INTRAVENOUS | Status: AC
  Administered 2019-07-18: 1000 mg via INTRAVENOUS
  Filled 2019-07-18: qty 40

## 2019-07-18 MED ORDER — HEPARIN SOD (PORK) LOCK FLUSH 100 UNIT/ML IV SOLN
500.0000 [IU] | Freq: Once | INTRAVENOUS | Status: AC | PRN
  Administered 2019-07-18: 500 [IU]
  Filled 2019-07-18: qty 5

## 2019-07-18 MED ORDER — SODIUM CHLORIDE 0.9% FLUSH
10.0000 mL | INTRAVENOUS | Status: DC | PRN
  Administered 2019-07-18: 10 mL
  Filled 2019-07-18: qty 10

## 2019-07-18 MED ORDER — SODIUM CHLORIDE 0.9 % IV SOLN
Freq: Once | INTRAVENOUS | Status: AC
  Filled 2019-07-18: qty 250

## 2019-07-18 MED ORDER — SODIUM CHLORIDE 0.9 % IV SOLN
395.0000 mg | Freq: Once | INTRAVENOUS | Status: AC
  Administered 2019-07-18: 400 mg via INTRAVENOUS
  Filled 2019-07-18: qty 40

## 2019-07-18 MED ORDER — CYANOCOBALAMIN 1000 MCG/ML IJ SOLN
INTRAMUSCULAR | Status: AC
  Filled 2019-07-18: qty 1

## 2019-07-18 MED ORDER — PALONOSETRON HCL INJECTION 0.25 MG/5ML
0.2500 mg | Freq: Once | INTRAVENOUS | Status: AC
  Administered 2019-07-18: 0.25 mg via INTRAVENOUS

## 2019-07-18 MED ORDER — PALONOSETRON HCL INJECTION 0.25 MG/5ML
INTRAVENOUS | Status: AC
  Filled 2019-07-18: qty 5

## 2019-07-18 MED ORDER — SODIUM CHLORIDE 0.9 % IV SOLN
200.0000 mg | Freq: Once | INTRAVENOUS | Status: AC
  Administered 2019-07-18: 200 mg via INTRAVENOUS
  Filled 2019-07-18: qty 8

## 2019-07-18 MED ORDER — TRIAMCINOLONE ACETONIDE 0.1 % EX LOTN: TOPICAL_LOTION | CUTANEOUS | 1 refills | Status: DC

## 2019-07-18 MED ORDER — CYANOCOBALAMIN 1000 MCG/ML IJ SOLN
1000.0000 ug | Freq: Once | INTRAMUSCULAR | Status: AC
  Administered 2019-07-18: 1000 ug via INTRAMUSCULAR

## 2019-07-18 MED ORDER — SODIUM CHLORIDE 0.9% FLUSH
10.0000 mL | Freq: Once | INTRAVENOUS | Status: AC
  Administered 2019-07-18: 10 mL
  Filled 2019-07-18: qty 10

## 2019-07-18 MED ORDER — SODIUM CHLORIDE 0.9 % IV SOLN
Freq: Once | INTRAVENOUS | Status: AC
  Filled 2019-07-18: qty 5

## 2019-07-18 NOTE — Progress Notes (Addendum)
[facility] OFFICE PROGRESS NOTE   Diagnosis: Non-small cell lung cancer  INTERVAL HISTORY:   [name] returns as scheduled.  She completed cycle 2 carboplatin/Alimta/pembrolizumab 06/27/2019.  She denies nausea/vomiting.  No mouth sores.  No diarrhea.  Earlier in the week she noticed a rash mainly on the forearms, a few at the lower legs and 1 or 2 at the upper chest.  Pruritus is mild.  She continues to have urinary frequency and burning.  She began cephalexin earlier today.  She continues to have back, groin/hip pain.  Objective:  Vital signs in last 24 hours:  Blood pressure (!) 142/54, pulse 62, temperature 98 F (36.7 C), temperature source Temporal, resp. rate 17, height 5\' 4"  (1.626 m), weight 176 lb 9.6 oz (80.1 kg), SpO2 97 %.    HEENT: No thrush or ulcers. Resp: Lungs clear bilaterally. Cardio: Regular rate and rhythm. GI: Abdomen soft and nontender.  No hepatomegaly. Vascular: No leg edema. Neuro: Alert and oriented. Skin: Erythematous scaly skin lesions scattered over the forearms, approximately 3 to 6 mm in size.  Similar appearing lesion at the left upper anterior chest, right upper anterior chest, left lower leg and right lower leg. Port-A-Cath without erythema.   Lab Results:  Lab Results  Component Value Date   WBC 3.9 (L) 07/18/2019   HGB 10.4 (L) 07/18/2019   HCT 31.7 (L) 07/18/2019   MCV 96.6 07/18/2019   PLT 201 07/18/2019   NEUTROABS 1.9 07/18/2019    Imaging:  No results found.  Medications: I have reviewed the patient's current medications.  Assessment/Plan: 1. Non-small cell lung cancer  MRI lumbar spine 04/29/2019-enlarging marrow lesions involving the L1 vertebral body, upper left sacrum and right iliac bone  MRI pelvis 04/29/2019-3.5 cm left iliac bone lesion appears slightly larger; other similar appearing lesions present within the left superior pubic ramus, left superior abdomen acetabulum and upper left  sacrum  Kappa free light chains with mild elevation 05/12/2019  CTs10/07/2019-left lower lobe pulmonary mass 3.3 x 3.2 cm; lytic process left iliac bone; spinal lesions; 1.1 cm low-density left kidney lesion; right thyroid enlargement with heterogeneous appearance with potential for multiple discrete lesions  Biopsy left lower lobe lung mass 05/26/2019-poorly differentiated carcinoma; positive for cytokeratin 5/6, p63 and TTF-1  Cycle 1 carboplatin/Alimta/pembrolizumab 06/06/2019  Cycle 2 carboplatin/Alimta/pembrolizumab 06/27/2019  Cycle 3 carboplatin/Alimta/pembrolizumab 07/18/2019 2. Pain secondary to #1 3. Chronic back pain 4. Type 2 diabetes 5. Essential hypertension 6. CAD 7. Hyperlipidemia 8. Family history significant for multiple members with breast cancer 9. Grade 1 skin rash 07/18/2019 likely related to immunotherapy.  Topical steroid cream as needed. 10. E. coli urinary tract infection 07/14/2019.  Completing cephalexin.   Disposition: [name] appears stable.  She has completed 2 cycles of carboplatin/Alimta/pembrolizumab.  Plan to proceed with cycle 3 today as scheduled.  She will undergo restaging CTs after completing 4 cycles.  We reviewed the CBC and chemistry panel from today, adequate for treatment.  She has a grade 1 skin rash likely related to pembrolizumab.  We are sending a prescription for triamcinolone cream to her pharmacy.  She will contact the office if the rash worsens.  She will return for lab, follow-up, cycle 4 carboplatin/Alimta/pembrolizumab in 3 weeks.  Patient seen with Dr. Benay Spice.  25 minutes were spent face-to-face at today's visit with the majority of that time involved in counseling/coordination of care.    [name] ANP/GNP-BC   07/18/2019  2:05 PM  This was a shared  visit with [name].  [name] was interviewed and examined.  She has a mild rash, likely secondary to pembrolizumab.  She will complete cycle 3 systemic  therapy today.  We are waiting on results of molecular testing.  She is completing a course of Keflex for an E. coli UTI.  Julieanne Manson, MD

## 2019-07-18 NOTE — Patient Instructions (Signed)
[facility] Discharge Instructions for Patients Receiving Chemotherapy  Today you received the following chemotherapy agents:  Pemetrexed, Pembrolizumab, Carboplatin  To help prevent nausea and vomiting after your treatment, we encourage you to take your nausea medication as prescribed.   If you develop nausea and vomiting that is not controlled by your nausea medication, call the clinic.   BELOW ARE SYMPTOMS THAT SHOULD BE REPORTED IMMEDIATELY:  *FEVER GREATER THAN 100.5 F  *CHILLS WITH OR WITHOUT FEVER  NAUSEA AND VOMITING THAT IS NOT CONTROLLED WITH YOUR NAUSEA MEDICATION  *UNUSUAL SHORTNESS OF BREATH  *UNUSUAL BRUISING OR BLEEDING  TENDERNESS IN MOUTH AND THROAT WITH OR WITHOUT PRESENCE OF ULCERS  *URINARY PROBLEMS  *BOWEL PROBLEMS  UNUSUAL RASH Items with * indicate a potential emergency and should be followed up as soon as possible.  Feel free to call the clinic should you have any questions or concerns. The clinic phone number is [phone].  Please show the [facility] at check-in to the Emergency Department and triage nurse.

## 2019-08-02 ENCOUNTER — Other Ambulatory Visit: Payer: Self-pay | Admitting: Oncology

## 2019-08-07 ENCOUNTER — Inpatient Hospital Stay: Payer: Medicare Other

## 2019-08-07 ENCOUNTER — Inpatient Hospital Stay: Payer: Medicare Other | Attending: Oncology | Admitting: Oncology

## 2019-08-07 ENCOUNTER — Other Ambulatory Visit: Payer: Self-pay

## 2019-08-07 VITALS — BP 142/62 | HR 61 | Temp 97.3°F | Resp 18 | Ht 64.0 in | Wt 177.9 lb

## 2019-08-07 DIAGNOSIS — C3492 Malignant neoplasm of unspecified part of left bronchus or lung: Secondary | ICD-10-CM

## 2019-08-07 DIAGNOSIS — R300 Dysuria: Secondary | ICD-10-CM | POA: Diagnosis not present

## 2019-08-07 DIAGNOSIS — Z5112 Encounter for antineoplastic immunotherapy: Secondary | ICD-10-CM | POA: Insufficient documentation

## 2019-08-07 DIAGNOSIS — Z5189 Encounter for other specified aftercare: Secondary | ICD-10-CM | POA: Insufficient documentation

## 2019-08-07 DIAGNOSIS — N23 Unspecified renal colic: Secondary | ICD-10-CM | POA: Diagnosis not present

## 2019-08-07 DIAGNOSIS — Z79899 Other long term (current) drug therapy: Secondary | ICD-10-CM | POA: Insufficient documentation

## 2019-08-07 DIAGNOSIS — R110 Nausea: Secondary | ICD-10-CM | POA: Insufficient documentation

## 2019-08-07 DIAGNOSIS — Z95828 Presence of other vascular implants and grafts: Secondary | ICD-10-CM

## 2019-08-07 DIAGNOSIS — R3912 Poor urinary stream: Secondary | ICD-10-CM | POA: Diagnosis not present

## 2019-08-07 DIAGNOSIS — C3432 Malignant neoplasm of lower lobe, left bronchus or lung: Secondary | ICD-10-CM | POA: Diagnosis present

## 2019-08-07 DIAGNOSIS — Z5111 Encounter for antineoplastic chemotherapy: Secondary | ICD-10-CM | POA: Insufficient documentation

## 2019-08-07 LAB — CMP (CANCER CENTER ONLY)
ALT: 25 U/L (ref 0–44)
AST: 24 U/L (ref 15–41)
Albumin: 4.2 g/dL (ref 3.5–5.0)
Alkaline Phosphatase: 77 U/L (ref 38–126)
Anion gap: 10 (ref 5–15)
BUN: 22 mg/dL (ref 8–23)
CO2: 24 mmol/L (ref 22–32)
Calcium: 9.3 mg/dL (ref 8.9–10.3)
Chloride: 106 mmol/L (ref 98–111)
Creatinine: 0.9 mg/dL (ref 0.44–1.00)
GFR, Est AFR Am: >60 mL/min (ref >60)
GFR, Estimated: 60 mL/min — ABNORMAL LOW (ref >60)
Glucose, Bld: 147 mg/dL — ABNORMAL HIGH (ref 70–99)
Potassium: 4.1 mmol/L (ref 3.5–5.1)
Sodium: 140 mmol/L (ref 135–145)
Total Bilirubin: 0.3 mg/dL (ref 0.3–1.2)
Total Protein: 6.9 g/dL (ref 6.5–8.1)

## 2019-08-07 LAB — URINALYSIS, COMPLETE (UACMP) WITH MICROSCOPIC
Bilirubin Urine: NEGATIVE
Glucose, UA: NEGATIVE mg/dL
Hgb urine dipstick: NEGATIVE
Ketones, ur: NEGATIVE mg/dL
Nitrite: NEGATIVE
Protein, ur: NEGATIVE mg/dL
Specific Gravity, Urine: 1.006 (ref 1.005–1.030)
pH: 6 (ref 5.0–8.0)

## 2019-08-07 LAB — CBC WITH DIFFERENTIAL (CANCER CENTER ONLY)
Abs Immature Granulocytes: 0 10*3/uL (ref 0.00–0.07)
Basophils Absolute: 0 10*3/uL (ref 0.0–0.1)
Basophils Relative: 0 %
Eosinophils Absolute: 0 10*3/uL (ref 0.0–0.5)
Eosinophils Relative: 1 %
HCT: 32.5 % — ABNORMAL LOW (ref 36.0–46.0)
Hemoglobin: 10.6 g/dL — ABNORMAL LOW (ref 12.0–15.0)
Immature Granulocytes: 0 %
Lymphocytes Relative: 36 %
Lymphs Abs: 1.1 10*3/uL (ref 0.7–4.0)
MCH: 31.5 pg (ref 26.0–34.0)
MCHC: 32.6 g/dL (ref 30.0–36.0)
MCV: 96.4 fL (ref 80.0–100.0)
Monocytes Absolute: 0.4 10*3/uL (ref 0.1–1.0)
Monocytes Relative: 13 %
Neutro Abs: 1.5 10*3/uL — ABNORMAL LOW (ref 1.7–7.7)
Neutrophils Relative %: 50 %
Platelet Count: 176 10*3/uL (ref 150–400)
RBC: 3.37 MIL/uL — ABNORMAL LOW (ref 3.87–5.11)
RDW: 16.4 % — ABNORMAL HIGH (ref 11.5–15.5)
WBC Count: 3 10*3/uL — ABNORMAL LOW (ref 4.0–10.5)
nRBC: 0 % (ref 0.0–0.2)

## 2019-08-07 LAB — TSH: TSH: 0.728 u[IU]/mL (ref 0.308–3.960)

## 2019-08-07 MED ORDER — SODIUM CHLORIDE 0.9 % IV SOLN
INTRAVENOUS | Status: DC
  Filled 2019-08-07: qty 250

## 2019-08-07 MED ORDER — SODIUM CHLORIDE 0.9% FLUSH
10.0000 mL | Freq: Once | INTRAVENOUS | Status: AC
  Administered 2019-08-07: 10 mL
  Filled 2019-08-07: qty 10

## 2019-08-07 MED ORDER — PALONOSETRON HCL INJECTION 0.25 MG/5ML
INTRAVENOUS | Status: AC
  Filled 2019-08-07: qty 5

## 2019-08-07 MED ORDER — SODIUM CHLORIDE 0.9 % IV SOLN
200.0000 mg | Freq: Once | INTRAVENOUS | Status: AC
  Administered 2019-08-07: 12:00:00 200 mg via INTRAVENOUS
  Filled 2019-08-07: qty 8

## 2019-08-07 MED ORDER — SODIUM CHLORIDE 0.9 % IV SOLN
Freq: Once | INTRAVENOUS | Status: DC
  Filled 2019-08-07: qty 250

## 2019-08-07 MED ORDER — SODIUM CHLORIDE 0.9 % IV SOLN
395.0000 mg | Freq: Once | INTRAVENOUS | Status: AC
  Administered 2019-08-07: 400 mg via INTRAVENOUS
  Filled 2019-08-07: qty 40

## 2019-08-07 MED ORDER — SODIUM CHLORIDE 0.9 % IV SOLN
530.0000 mg/m2 | Freq: Once | INTRAVENOUS | Status: AC
  Administered 2019-08-07: 1000 mg via INTRAVENOUS
  Filled 2019-08-07: qty 40

## 2019-08-07 MED ORDER — DEXTROSE 5 % IV SOLN
1.0000 g | Freq: Once | INTRAVENOUS | Status: AC
  Administered 2019-08-07: 1 g via INTRAVENOUS
  Filled 2019-08-07: qty 10

## 2019-08-07 MED ORDER — SODIUM CHLORIDE 0.9 % IV SOLN
Freq: Once | INTRAVENOUS | Status: AC
  Filled 2019-08-07: qty 5

## 2019-08-07 MED ORDER — PALONOSETRON HCL INJECTION 0.25 MG/5ML
0.2500 mg | Freq: Once | INTRAVENOUS | Status: AC
  Administered 2019-08-07: 0.25 mg via INTRAVENOUS

## 2019-08-07 MED ORDER — NITROFURANTOIN MONOHYD MACRO 100 MG PO CAPS: 100.0000 mg | ORAL_CAPSULE | Freq: Two times a day (BID) | ORAL | 0 refills | Status: DC

## 2019-08-07 MED ORDER — HEPARIN SOD (PORK) LOCK FLUSH 100 UNIT/ML IV SOLN
500.0000 [IU] | Freq: Once | INTRAVENOUS | Status: AC
  Administered 2019-08-07: 500 [IU]
  Filled 2019-08-07: qty 5

## 2019-08-07 NOTE — Progress Notes (Signed)
  [facility] OFFICE PROGRESS NOTE   Diagnosis: Non-small cell lung cancer  INTERVAL HISTORY:   [name] completed another treatment with Alimta/carboplatin and pembrolizumab on July 18, 2019.  She reports mild nausea following chemotherapy.  No emesis.  Stable pain.  She complains of decreased urinary stream and discomfort with urination.  Objective:  Vital signs in last 24 hours:  Blood pressure (!) 142/62, pulse 61, temperature (!) 97.3 F (36.3 C), temperature source Temporal, resp. rate 18, height 5' 4" (1.626 m), weight 177 lb 14.4 oz (80.7 kg), SpO2 95 %.     GI: No hepatomegaly, nontender Vascular: No leg edema  Skin: No rash  Portacath/PICC-without erythema  Lab Results:  Lab Results  Component Value Date   WBC 3.0 (L) 08/07/2019   HGB 10.6 (L) 08/07/2019   HCT 32.5 (L) 08/07/2019   MCV 96.4 08/07/2019   PLT 176 08/07/2019   NEUTROABS 1.5 (L) 08/07/2019    CMP  Lab Results  Component Value Date   NA 139 07/18/2019   K 4.2 07/18/2019   CL 105 07/18/2019   CO2 25 07/18/2019   GLUCOSE 113 (H) 07/18/2019   BUN 19 07/18/2019   CREATININE 0.92 07/18/2019   CALCIUM 9.0 07/18/2019   PROT 6.7 07/18/2019   ALBUMIN 3.9 07/18/2019   AST 23 07/18/2019   ALT 22 07/18/2019   ALKPHOS 75 07/18/2019   BILITOT <0.2 (L) 07/18/2019   GFRNONAA 58 (L) 07/18/2019   GFRAA >60 07/18/2019    Medications: I have reviewed the patient's current medications.   Assessment/Plan: 1. Non-small cell lung cancer  MRI lumbar spine 04/29/2019-enlarging marrow lesions involving the L1 vertebral body, upper left sacrum and right iliac bone  MRI pelvis 04/29/2019-3.5 cm left iliac bone lesion appears slightly larger; other similar appearing lesions present within the left superior pubic ramus, left superior abdomen acetabulum and upper left sacrum  Kappa free light chains with mild elevation 05/12/2019  CTs10/07/2019-left lower lobe pulmonary mass 3.3 x  3.2 cm; lytic process left iliac bone; spinal lesions; 1.1 cm low-density left kidney lesion; right thyroid enlargement with heterogeneous appearance with potential for multiple discrete lesions  Biopsy left lower lobe lung mass 05/26/2019-poorly differentiated carcinoma; positive for cytokeratin 5/6, p63 and TTF-1, no EGFR, BRAF, ALK, ERBB2,ROS, or NTRK alteration  Cycle 1 carboplatin/Alimta/pembrolizumab 06/06/2019  Cycle 2 carboplatin/Alimta/pembrolizumab 06/27/2019  Cycle 3 carboplatin/Alimta/pembrolizumab 07/18/2019  Cycle 4 carboplatin/Alimta/pembrolizumab August 07, 2019 2. Pain secondary to #1 3. Chronic back pain 4. Type 2 diabetes 5. Essential hypertension 6. CAD 7. Hyperlipidemia 8. Family history significant for multiple members with breast cancer 9. Grade 1 skin rash 07/18/2019 likely related to immunotherapy.  Topical steroid cream as needed. 10. E. coli urinary tract infection 07/14/2019.  Completing cephalexin.     Disposition: [name] appears stable.  She will complete another cycle of systemic therapy today.  We reviewed results from the molecular testing.  She would not be a candidate for targeted therapy.  [name] will undergo a restaging CT evaluation after this cycle.  She has symptoms of urinary tract infection today.  She was treated for a resistant E. coli infection last month.  We will repeat a urinalysis and culture.  She will be treated with empiric antibiotics while we await the culture results.  [name], MD  08/07/2019  9:46 AM

## 2019-08-07 NOTE — Patient Instructions (Signed)
[facility] Discharge Instructions for Patients Receiving Chemotherapy  Today you received the following chemotherapy agents:  Pemetrexed, Pembrolizumab and Carboplatin.  To help prevent nausea and vomiting after your treatment, we encourage you to take your nausea medication as prescribed.   If you develop nausea and vomiting that is not controlled by your nausea medication, call the clinic.   BELOW ARE SYMPTOMS THAT SHOULD BE REPORTED IMMEDIATELY:  *FEVER GREATER THAN 100.5 F  *CHILLS WITH OR WITHOUT FEVER  NAUSEA AND VOMITING THAT IS NOT CONTROLLED WITH YOUR NAUSEA MEDICATION  *UNUSUAL SHORTNESS OF BREATH  *UNUSUAL BRUISING OR BLEEDING  TENDERNESS IN MOUTH AND THROAT WITH OR WITHOUT PRESENCE OF ULCERS  *URINARY PROBLEMS  *BOWEL PROBLEMS  UNUSUAL RASH Items with * indicate a potential emergency and should be followed up as soon as possible.  Feel free to call the clinic should you have any questions or concerns. The clinic phone number is [phone].  Please show the [facility] at check-in to the Emergency Department and triage nurse.

## 2019-08-07 NOTE — Progress Notes (Signed)
Per Dr. Benay Spice: OK to treat today with ANC 1.5. Has been approved for Udenyca.

## 2019-08-08 ENCOUNTER — Encounter: Payer: Self-pay | Admitting: Oncology

## 2019-08-08 ENCOUNTER — Inpatient Hospital Stay: Payer: Medicare Other

## 2019-08-08 ENCOUNTER — Telehealth: Payer: Self-pay | Admitting: Oncology

## 2019-08-08 ENCOUNTER — Other Ambulatory Visit: Payer: Self-pay

## 2019-08-08 VITALS — BP 136/53 | HR 63 | Temp 99.0°F | Resp 18

## 2019-08-08 DIAGNOSIS — C3492 Malignant neoplasm of unspecified part of left bronchus or lung: Secondary | ICD-10-CM

## 2019-08-08 DIAGNOSIS — Z5112 Encounter for antineoplastic immunotherapy: Secondary | ICD-10-CM | POA: Diagnosis not present

## 2019-08-08 LAB — URINE CULTURE

## 2019-08-08 MED ORDER — PEGFILGRASTIM-CBQV 6 MG/0.6ML ~~LOC~~ SOSY
PREFILLED_SYRINGE | SUBCUTANEOUS | Status: AC
  Filled 2019-08-08: qty 0.6

## 2019-08-08 MED ORDER — PEGFILGRASTIM-CBQV 6 MG/0.6ML ~~LOC~~ SOSY
6.0000 mg | PREFILLED_SYRINGE | Freq: Once | SUBCUTANEOUS | Status: AC
  Administered 2019-08-08: 6 mg via SUBCUTANEOUS

## 2019-08-08 NOTE — Patient Instructions (Signed)

## 2019-08-08 NOTE — Telephone Encounter (Signed)
Scheduled per los. Called and spoke with patient. Confirmed appt 

## 2019-08-11 ENCOUNTER — Encounter (HOSPITAL_COMMUNITY): Payer: Self-pay

## 2019-08-14 ENCOUNTER — Telehealth: Payer: Self-pay | Admitting: *Deleted

## 2019-08-14 ENCOUNTER — Encounter: Payer: Self-pay | Admitting: *Deleted

## 2019-08-14 DIAGNOSIS — R300 Dysuria: Secondary | ICD-10-CM

## 2019-08-14 NOTE — Telephone Encounter (Addendum)
Reports she finished antibiotic (Macrobid) and still having symptoms. Reports continued burning with urination with slow stream. Had a yeast infection that she treated w/Monostat and this has resolved. Per Dr. Benay Spice: Repeat U/A and culture. Last culture was negative. Patient notified and agrees to come in on 1/15 at [facility].

## 2019-08-15 ENCOUNTER — Telehealth: Payer: Self-pay

## 2019-08-15 ENCOUNTER — Other Ambulatory Visit: Payer: Self-pay

## 2019-08-15 ENCOUNTER — Inpatient Hospital Stay: Payer: Medicare Other

## 2019-08-15 DIAGNOSIS — Z5112 Encounter for antineoplastic immunotherapy: Secondary | ICD-10-CM | POA: Diagnosis not present

## 2019-08-15 DIAGNOSIS — R300 Dysuria: Secondary | ICD-10-CM

## 2019-08-15 LAB — URINALYSIS, COMPLETE (UACMP) WITH MICROSCOPIC
Bilirubin Urine: NEGATIVE
Glucose, UA: NEGATIVE mg/dL
Hgb urine dipstick: NEGATIVE
Ketones, ur: NEGATIVE mg/dL
Leukocytes,Ua: NEGATIVE
Nitrite: NEGATIVE
Protein, ur: NEGATIVE mg/dL
Specific Gravity, Urine: 1.004 — ABNORMAL LOW (ref 1.005–1.030)
pH: 6 (ref 5.0–8.0)

## 2019-08-15 NOTE — Telephone Encounter (Signed)
TC to pt per Dr Benay Spice to let her know that her urinalysis is negative for infection. I asked what her complaints were? And she stated that she is having burning while urinating and she doesn't not have a stream, when she urinates it is more like a dribble. Dr Benay Spice made aware.

## 2019-08-15 NOTE — Telephone Encounter (Signed)
TC to pt per Dr Benay Spice to inquire about where patient burning pain was located. Patient stated that burning was coming from the inside of her bladder. I asked if she would be ok if Dr Benay Spice referred her to a urologist? She stated that would be fine and ask could she be referred to Dr Jeffie Pollock at [facility] where her husband goes? Dr Benay Spice is aware of her request and stated that would be fine.

## 2019-08-16 LAB — URINE CULTURE: Culture: <10000 — AB

## 2019-08-18 ENCOUNTER — Encounter: Payer: Self-pay | Admitting: Oncology

## 2019-08-18 ENCOUNTER — Other Ambulatory Visit: Payer: Self-pay | Admitting: *Deleted

## 2019-08-18 ENCOUNTER — Telehealth: Payer: Self-pay | Admitting: Oncology

## 2019-08-18 DIAGNOSIS — C3492 Malignant neoplasm of unspecified part of left bronchus or lung: Secondary | ICD-10-CM

## 2019-08-18 DIAGNOSIS — R300 Dysuria: Secondary | ICD-10-CM

## 2019-08-18 NOTE — Progress Notes (Signed)
Referral ordered for Dr. Jeffie Pollock per Dr. Benay Spice. Notified scheduler.

## 2019-08-18 NOTE — Telephone Encounter (Signed)
Per 1/18 sch msg. Urgent referral sent to [facility] via [facility]

## 2019-08-19 ENCOUNTER — Ambulatory Visit: Payer: Medicare Other | Attending: Internal Medicine

## 2019-08-19 DIAGNOSIS — Z23 Encounter for immunization: Secondary | ICD-10-CM | POA: Insufficient documentation

## 2019-08-19 NOTE — Progress Notes (Signed)
   Covid-19 Vaccination Clinic  Name:  [name]    MRN: [id number] DOB: July 28, 1935  08/19/2019  [name] was observed post Covid-19 immunization for 30 minutes based on pre-vaccination screening without incidence. She was provided with Vaccine Information Sheet and instruction to access the V-Safe system.   [name] was instructed to call 911 with any severe reactions post vaccine: [place] Kitchen Difficulty breathing  . Swelling of your face and throat  . A fast heartbeat  . A bad rash all over your body  . Dizziness and weakness    Immunizations Administered    Name Date Dose VIS Date Route   Pfizer COVID-19 Vaccine 08/19/2019  1:27 AM 0.3 mL 07/11/2019 Intramuscular   Manufacturer: [employer], [employer]   Lot: [id number]   [place]: [id number]

## 2019-08-20 ENCOUNTER — Telehealth: Payer: Self-pay | Admitting: *Deleted

## 2019-08-20 NOTE — Telephone Encounter (Signed)
Records faxed to [facility] to att [place]  - release [id number]

## 2019-08-21 ENCOUNTER — Telehealth: Payer: Self-pay | Admitting: *Deleted

## 2019-08-21 ENCOUNTER — Inpatient Hospital Stay (HOSPITAL_BASED_OUTPATIENT_CLINIC_OR_DEPARTMENT_OTHER): Payer: Medicare Other | Admitting: Nurse Practitioner

## 2019-08-21 ENCOUNTER — Other Ambulatory Visit: Payer: Self-pay

## 2019-08-21 ENCOUNTER — Encounter: Payer: Self-pay | Admitting: Nurse Practitioner

## 2019-08-21 VITALS — BP 140/82 | HR 68 | Temp 97.2°F | Ht 64.0 in | Wt 175.9 lb

## 2019-08-21 DIAGNOSIS — Z95828 Presence of other vascular implants and grafts: Secondary | ICD-10-CM

## 2019-08-21 DIAGNOSIS — Z5112 Encounter for antineoplastic immunotherapy: Secondary | ICD-10-CM | POA: Diagnosis not present

## 2019-08-21 NOTE — Telephone Encounter (Signed)
Reports her port site is red and tender. Agrees to come in today at 12:15 to be seen.

## 2019-08-21 NOTE — Progress Notes (Addendum)
[name] OFFICE PROGRESS NOTE   Diagnosis: Non-small cell lung cancer  INTERVAL HISTORY:   [name] returns prior to scheduled follow-up for evaluation of the Port-A-Cath site.  She completed cycle 4 carboplatin/Alimta/pembrolizumab 08/07/2019.  She reports the Port-A-Cath site has been "red and irritated" for the past 7 to 9 days.  She notes pruritus at times as well.  Initially there was mild tenderness at the site.  All symptoms are improving.  She denies fever. No shaking chills.  She continues to have urinary symptoms.  She has an appointment with urology.  Objective:  Vital signs in last 24 hours:  Blood pressure 140/82, pulse 68, temperature (!) 97.2 F (36.2 C), temperature source Temporal, height _0  (1.626 m), weight 175 lb 14.4 oz (79.8 kg), SpO2 97 %.    GI: Abdomen soft and nontender. Vascular: No leg edema. Neuro: Alert and oriented. Skin: No rash. Port-A-Cath site with mild dusky erythema overlying the actual device.  Nontender.  Nontender along the Port-A-Cath tract.  No fluctuance.   Lab Results:  Lab Results  Component Value Date   WBC 3.0 (L) 08/07/2019   HGB 10.6 (L) 08/07/2019   HCT 32.5 (L) 08/07/2019   MCV 96.4 08/07/2019   PLT 176 08/07/2019   NEUTROABS 1.5 (L) 08/07/2019    Imaging:  No results found.  Medications: I have reviewed the patient's current medications.  Assessment/Plan: 1. Non-small cell lung cancer  MRI lumbar spine 04/29/2019-enlarging marrow lesions involving the L1 vertebral body, upper left sacrum and right iliac bone  MRI pelvis 04/29/2019-3.5 cm left iliac bone lesion appears slightly larger; other similar appearing lesions present within the left superior pubic ramus, left superior abdomen acetabulum and upper left sacrum  Kappa free light chains with mild elevation 05/12/2019  CTs10/07/2019-left lower lobe pulmonary mass 3.3 x 3.2 cm; lytic process left iliac bone; spinal lesions; 1.1 cm  low-density left kidney lesion; right thyroid enlargement with heterogeneous appearance with potential for multiple discrete lesions  Biopsy left lower lobe lung mass 05/26/2019-poorly differentiated carcinoma; positive for cytokeratin 5/6, p63 and TTF-1, no EGFR, BRAF, ALK, ERBB2,ROS, or NTRK alteration  Cycle 1 carboplatin/Alimta/pembrolizumab 06/06/2019  Cycle 2 carboplatin/Alimta/pembrolizumab 06/27/2019  Cycle 3 carboplatin/Alimta/pembrolizumab 07/18/2019  Cycle 4 carboplatin/Alimta/pembrolizumab 08/07/2019 2. Pain secondary to #1 3. Chronic back pain 4. Type 2 diabetes 5. Essential hypertension 6. CAD 7. Hyperlipidemia 8. Family history significant for multiple members with breast cancer 9. Grade 1 skin rash 07/18/2019 likely related to immunotherapy.  Topical steroid cream as needed. 10. E. coli urinary tract infection 07/14/2019.  Completing cephalexin.  Disposition: [name] appears stable.  She has completed 4 cycles of carboplatin/Alimta/pembrolizumab.  She is scheduled for restaging CT scans next week.    She is seen today to evaluate erythema at the Port-A-Cath site.  The Port-A-Cath does not appear infected.  Symptoms may be a reaction to the Port-A-Cath dressing or cleaning agent.  She will continue to monitor.  The symptoms are improving.  She will follow-up as scheduled on 08/28/2019.  She will contact the office in the interim with any problems.  We specifically discussed worsening of current symptoms.  Patient seen with Dr. Benay Spice.  Ned Card ANP/GNP-BC   08/21/2019  1:07 PM This was a shared visit with Ned Card.  [name] was interviewed and examined.  We have a low suspicion for a Port-A-Cath infection.  She will follow-up as scheduled and call in the interim as needed.  Julieanne Manson, MD

## 2019-08-24 ENCOUNTER — Other Ambulatory Visit: Payer: Self-pay | Admitting: Oncology

## 2019-08-25 ENCOUNTER — Encounter: Payer: Self-pay | Admitting: Oncology

## 2019-08-27 ENCOUNTER — Encounter (HOSPITAL_COMMUNITY): Payer: Self-pay

## 2019-08-27 ENCOUNTER — Inpatient Hospital Stay: Payer: Medicare Other

## 2019-08-27 ENCOUNTER — Ambulatory Visit (HOSPITAL_COMMUNITY)
Admission: RE | Admit: 2019-08-27 | Discharge: 2019-08-27 | Disposition: A | Discharge status: Home / Self Care | Payer: Medicare Other | Source: Ambulatory Visit | Attending: Oncology | Admitting: Oncology

## 2019-08-27 ENCOUNTER — Other Ambulatory Visit: Payer: Self-pay

## 2019-08-27 DIAGNOSIS — Z5112 Encounter for antineoplastic immunotherapy: Secondary | ICD-10-CM | POA: Diagnosis not present

## 2019-08-27 DIAGNOSIS — Z95828 Presence of other vascular implants and grafts: Secondary | ICD-10-CM

## 2019-08-27 DIAGNOSIS — C3492 Malignant neoplasm of unspecified part of left bronchus or lung: Secondary | ICD-10-CM | POA: Insufficient documentation

## 2019-08-27 LAB — CMP (CANCER CENTER ONLY)
ALT: 20 U/L (ref 0–44)
AST: 22 U/L (ref 15–41)
Albumin: 4.1 g/dL (ref 3.5–5.0)
Alkaline Phosphatase: 94 U/L (ref 38–126)
Anion gap: 8 (ref 5–15)
BUN: 19 mg/dL (ref 8–23)
CO2: 25 mmol/L (ref 22–32)
Calcium: 9.3 mg/dL (ref 8.9–10.3)
Chloride: 105 mmol/L (ref 98–111)
Creatinine: 0.81 mg/dL (ref 0.44–1.00)
GFR, Est AFR Am: >60 mL/min (ref >60)
GFR, Estimated: >60 mL/min (ref >60)
Glucose, Bld: 112 mg/dL — ABNORMAL HIGH (ref 70–99)
Potassium: 4.9 mmol/L (ref 3.5–5.1)
Sodium: 138 mmol/L (ref 135–145)
Total Bilirubin: 0.3 mg/dL (ref 0.3–1.2)
Total Protein: 6.9 g/dL (ref 6.5–8.1)

## 2019-08-27 LAB — CBC WITH DIFFERENTIAL (CANCER CENTER ONLY)
Abs Immature Granulocytes: 0.01 10*3/uL (ref 0.00–0.07)
Basophils Absolute: 0 10*3/uL (ref 0.0–0.1)
Basophils Relative: 0 %
Eosinophils Absolute: 0 10*3/uL (ref 0.0–0.5)
Eosinophils Relative: 1 %
HCT: 32 % — ABNORMAL LOW (ref 36.0–46.0)
Hemoglobin: 10.5 g/dL — ABNORMAL LOW (ref 12.0–15.0)
Immature Granulocytes: 0 %
Lymphocytes Relative: 20 %
Lymphs Abs: 1.2 10*3/uL (ref 0.7–4.0)
MCH: 32.1 pg (ref 26.0–34.0)
MCHC: 32.8 g/dL (ref 30.0–36.0)
MCV: 97.9 fL (ref 80.0–100.0)
Monocytes Absolute: 0.6 10*3/uL (ref 0.1–1.0)
Monocytes Relative: 10 %
Neutro Abs: 4.1 10*3/uL (ref 1.7–7.7)
Neutrophils Relative %: 69 %
Platelet Count: 223 10*3/uL (ref 150–400)
RBC: 3.27 MIL/uL — ABNORMAL LOW (ref 3.87–5.11)
RDW: 17.3 % — ABNORMAL HIGH (ref 11.5–15.5)
WBC Count: 6 10*3/uL (ref 4.0–10.5)
nRBC: 0 % (ref 0.0–0.2)

## 2019-08-27 MED ORDER — IOHEXOL 300 MG/ML  SOLN
100.0000 mL | Freq: Once | INTRAMUSCULAR | Status: AC | PRN
  Administered 2019-08-27: 100 mL via INTRAVENOUS

## 2019-08-27 MED ORDER — SODIUM CHLORIDE 0.9% FLUSH
10.0000 mL | Freq: Once | INTRAVENOUS | Status: AC
  Administered 2019-08-27: 10 mL
  Filled 2019-08-27: qty 10

## 2019-08-27 MED ORDER — HEPARIN SOD (PORK) LOCK FLUSH 100 UNIT/ML IV SOLN
500.0000 [IU] | Freq: Once | INTRAVENOUS | Status: AC
  Administered 2019-08-27: 500 [IU] via INTRAVENOUS

## 2019-08-27 MED ORDER — SODIUM CHLORIDE (PF) 0.9 % IJ SOLN
INTRAMUSCULAR | Status: AC
  Filled 2019-08-27: qty 50

## 2019-08-27 MED ORDER — HEPARIN SOD (PORK) LOCK FLUSH 100 UNIT/ML IV SOLN
INTRAVENOUS | Status: AC
  Filled 2019-08-27: qty 5

## 2019-08-28 ENCOUNTER — Encounter: Payer: Self-pay | Admitting: Nurse Practitioner

## 2019-08-28 ENCOUNTER — Other Ambulatory Visit: Payer: Self-pay

## 2019-08-28 ENCOUNTER — Inpatient Hospital Stay: Payer: Medicare Other | Admitting: Nurse Practitioner

## 2019-08-28 ENCOUNTER — Ambulatory Visit: Payer: Medicare Other

## 2019-08-28 ENCOUNTER — Ambulatory Visit: Payer: Medicare Other | Admitting: Nurse Practitioner

## 2019-08-28 ENCOUNTER — Inpatient Hospital Stay: Payer: Medicare Other

## 2019-08-28 VITALS — BP 152/60 | HR 58 | Temp 97.9°F | Resp 17 | Ht 64.0 in | Wt 177.8 lb

## 2019-08-28 DIAGNOSIS — C3492 Malignant neoplasm of unspecified part of left bronchus or lung: Secondary | ICD-10-CM

## 2019-08-28 DIAGNOSIS — Z5112 Encounter for antineoplastic immunotherapy: Secondary | ICD-10-CM | POA: Diagnosis not present

## 2019-08-28 MED ORDER — SODIUM CHLORIDE 0.9 % IV SOLN
200.0000 mg | Freq: Once | INTRAVENOUS | Status: AC
  Administered 2019-08-28: 200 mg via INTRAVENOUS
  Filled 2019-08-28: qty 8

## 2019-08-28 MED ORDER — SODIUM CHLORIDE 0.9 % IV SOLN
530.0000 mg/m2 | Freq: Once | INTRAVENOUS | Status: AC
  Administered 2019-08-28: 1000 mg via INTRAVENOUS
  Filled 2019-08-28: qty 40

## 2019-08-28 MED ORDER — SODIUM CHLORIDE 0.9 % IV SOLN
Freq: Once | INTRAVENOUS | Status: AC
  Filled 2019-08-28: qty 250

## 2019-08-28 MED ORDER — SODIUM CHLORIDE 0.9% FLUSH
10.0000 mL | INTRAVENOUS | Status: DC | PRN
  Administered 2019-08-28: 10 mL
  Filled 2019-08-28: qty 10

## 2019-08-28 MED ORDER — PROCHLORPERAZINE MALEATE 10 MG PO TABS
ORAL_TABLET | ORAL | Status: AC
  Filled 2019-08-28: qty 1

## 2019-08-28 MED ORDER — PROCHLORPERAZINE MALEATE 10 MG PO TABS
10.0000 mg | ORAL_TABLET | Freq: Once | ORAL | Status: AC
  Administered 2019-08-28: 10 mg via ORAL

## 2019-08-28 MED ORDER — HEPARIN SOD (PORK) LOCK FLUSH 100 UNIT/ML IV SOLN
500.0000 [IU] | Freq: Once | INTRAVENOUS | Status: AC | PRN
  Administered 2019-08-28: 500 [IU]
  Filled 2019-08-28: qty 5

## 2019-08-28 NOTE — Patient Instructions (Signed)
[facility] Discharge Instructions for Patients Receiving Chemotherapy  Today you received the following chemotherapy agents Keytruda, Alimta.  To help prevent nausea and vomiting after your treatment, we encourage you to take your nausea medication    If you develop nausea and vomiting that is not controlled by your nausea medication, call the clinic.   BELOW ARE SYMPTOMS THAT SHOULD BE REPORTED IMMEDIATELY:  *FEVER GREATER THAN 100.5 F  *CHILLS WITH OR WITHOUT FEVER  NAUSEA AND VOMITING THAT IS NOT CONTROLLED WITH YOUR NAUSEA MEDICATION  *UNUSUAL SHORTNESS OF BREATH  *UNUSUAL BRUISING OR BLEEDING  TENDERNESS IN MOUTH AND THROAT WITH OR WITHOUT PRESENCE OF ULCERS  *URINARY PROBLEMS  *BOWEL PROBLEMS  UNUSUAL RASH Items with * indicate a potential emergency and should be followed up as soon as possible.  Feel free to call the clinic should you have any questions or concerns. The clinic phone number is [phone].  Please show the [facility] [place] at check-in to the Emergency Department and triage nurse.

## 2019-08-28 NOTE — Progress Notes (Addendum)
[name] OFFICE PROGRESS NOTE   Diagnosis: Non-small cell lung cancer  INTERVAL HISTORY:   [name] returns as scheduled.  She completed cycle 4 carboplatin/Alimta/pembrolizumab 08/07/2019.  She had nausea and an episode of emesis on day 3.  No mouth sores.  She had loose stools 1 day.  She has dyspnea on exertion.  No cough or fever.  No rash.  Pain overall is unchanged.  Objective:  Vital signs in last 24 hours:  Blood pressure (!) 152/60, pulse (!) 58, temperature 97.9 F (36.6 C), temperature source Temporal, resp. rate 17, height '5\' 4"'  (1.626 m), weight 177 lb 12.8 oz (80.6 kg), SpO2 98 %.    HEENT: No thrush or ulcers. GI: Abdomen soft and nontender.  No hepatomegaly. Vascular: No leg edema. Neuro: Alert and oriented. Skin: No rash. Port-A-Cath with skin discoloration over the actual device.   Lab Results:  Lab Results  Component Value Date   WBC 6.0 08/27/2019   HGB 10.5 (L) 08/27/2019   HCT 32.0 (L) 08/27/2019   MCV 97.9 08/27/2019   PLT 223 08/27/2019   NEUTROABS 4.1 08/27/2019    Imaging:  CT Chest W Contrast  Result Date: 08/27/2019 CLINICAL DATA:  Restaging lung cancer EXAM: CT CHEST, ABDOMEN, AND PELVIS WITH CONTRAST TECHNIQUE: Multidetector CT imaging of the chest, abdomen and pelvis was performed following the standard protocol during bolus administration of intravenous contrast. CONTRAST:  169m OMNIPAQUE IOHEXOL 300 MG/ML SOLN, additional oral enteric contrast COMPARISON:  CT chest abdomen pelvis, 05/12/2019, PET-CT, 05/19/2019, MR lumbar spine, 04/29/2019 FINDINGS: CT CHEST FINDINGS Cardiovascular: Right chest port catheter. Aortic atherosclerosis. Normal heart size. Three-vessel coronary artery calcifications. No pericardial effusion. Mediastinum/Nodes: No enlarged mediastinal, hilar, or axillary lymph nodes. Heterogeneous multinodular thyroid. Trachea, and esophagus demonstrate no significant findings. Lungs/Pleura: Significant  interval decrease in size in a lobulated mass of the dependent left lower lobe, the dominant component now measuring approximately 2.0 x 1.5 cm, previously 3.3 x 3.2 cm when measured similarly (series 4, image 99). No pleural effusion or pneumothorax. Musculoskeletal: No chest wall mass or suspicious bone lesions identified. CT ABDOMEN PELVIS FINDINGS Hepatobiliary: No solid liver abnormality is seen. No gallstones, gallbladder wall thickening, or biliary dilatation. Pancreas: Unremarkable. No pancreatic ductal dilatation or surrounding inflammatory changes. Spleen: Normal in size without significant abnormality. Adrenals/Urinary Tract: Adrenal glands are unremarkable. Kidneys are normal, without renal calculi, solid lesion, or hydronephrosis. Bladder is unremarkable. Stomach/Bowel: Stomach is within normal limits. Appendix is not clearly visualized and may be surgically absent. No evidence of bowel wall thickening, distention, or inflammatory changes. Large burden of stool in the colon. Vascular/Lymphatic: No significant vascular findings are present. No enlarged abdominal or pelvic lymph nodes. Reproductive: Status post hysterectomy. Other: No abdominal wall hernia or abnormality. No abdominopelvic ascites. Musculoskeletal: Unchanged appearance of subtle osseous lesions, generally better assessed by prior MRI examination dated 04/29/2019 without high level FDG avidity on prior PET-CT, including lucent lesions of the left aspect of S1 (series 2, image 95 and the left iliac crest (series 2, image 105). Generally bland appearing Schmorl type superior endplate deformity of L1 (series 6, image 90). A previously described lucent lesion of the left scapula is not well appreciated. IMPRESSION: 1. Significant interval decrease in size of lobulated mass of the dependent left lower lobe, the dominant component now measuring 2.0 x 1.5 cm, previously 3.3 x 3.2 cm. Findings are consistent with treatment response of primary lung  malignancy. 2. Unchanged appearance of subtle osseous lesions, generally  better assessed by prior MRI examination dated 04/29/2019 without high level FDG avidity on prior PET-CT. A previously described lucent lesion of the left scapula is not well appreciated. Findings remain nonspecific although concerning for osseous metastatic disease. Attention on follow-up. 3. Coronary artery disease.  Aortic Atherosclerosis (ICD10-I70.0). Electronically Signed   By: Eddie Candle M.D.   On: 08/27/2019 10:44   CT Abdomen Pelvis W Contrast  Result Date: 08/27/2019 CLINICAL DATA:  Restaging lung cancer EXAM: CT CHEST, ABDOMEN, AND PELVIS WITH CONTRAST TECHNIQUE: Multidetector CT imaging of the chest, abdomen and pelvis was performed following the standard protocol during bolus administration of intravenous contrast. CONTRAST:  139m OMNIPAQUE IOHEXOL 300 MG/ML SOLN, additional oral enteric contrast COMPARISON:  CT chest abdomen pelvis, 05/12/2019, PET-CT, 05/19/2019, MR lumbar spine, 04/29/2019 FINDINGS: CT CHEST FINDINGS Cardiovascular: Right chest port catheter. Aortic atherosclerosis. Normal heart size. Three-vessel coronary artery calcifications. No pericardial effusion. Mediastinum/Nodes: No enlarged mediastinal, hilar, or axillary lymph nodes. Heterogeneous multinodular thyroid. Trachea, and esophagus demonstrate no significant findings. Lungs/Pleura: Significant interval decrease in size in a lobulated mass of the dependent left lower lobe, the dominant component now measuring approximately 2.0 x 1.5 cm, previously 3.3 x 3.2 cm when measured similarly (series 4, image 99). No pleural effusion or pneumothorax. Musculoskeletal: No chest wall mass or suspicious bone lesions identified. CT ABDOMEN PELVIS FINDINGS Hepatobiliary: No solid liver abnormality is seen. No gallstones, gallbladder wall thickening, or biliary dilatation. Pancreas: Unremarkable. No pancreatic ductal dilatation or surrounding inflammatory changes.  Spleen: Normal in size without significant abnormality. Adrenals/Urinary Tract: Adrenal glands are unremarkable. Kidneys are normal, without renal calculi, solid lesion, or hydronephrosis. Bladder is unremarkable. Stomach/Bowel: Stomach is within normal limits. Appendix is not clearly visualized and may be surgically absent. No evidence of bowel wall thickening, distention, or inflammatory changes. Large burden of stool in the colon. Vascular/Lymphatic: No significant vascular findings are present. No enlarged abdominal or pelvic lymph nodes. Reproductive: Status post hysterectomy. Other: No abdominal wall hernia or abnormality. No abdominopelvic ascites. Musculoskeletal: Unchanged appearance of subtle osseous lesions, generally better assessed by prior MRI examination dated 04/29/2019 without high level FDG avidity on prior PET-CT, including lucent lesions of the left aspect of S1 (series 2, image 95 and the left iliac crest (series 2, image 105). Generally bland appearing Schmorl type superior endplate deformity of L1 (series 6, image 90). A previously described lucent lesion of the left scapula is not well appreciated. IMPRESSION: 1. Significant interval decrease in size of lobulated mass of the dependent left lower lobe, the dominant component now measuring 2.0 x 1.5 cm, previously 3.3 x 3.2 cm. Findings are consistent with treatment response of primary lung malignancy. 2. Unchanged appearance of subtle osseous lesions, generally better assessed by prior MRI examination dated 04/29/2019 without high level FDG avidity on prior PET-CT. A previously described lucent lesion of the left scapula is not well appreciated. Findings remain nonspecific although concerning for osseous metastatic disease. Attention on follow-up. 3. Coronary artery disease.  Aortic Atherosclerosis (ICD10-I70.0). Electronically Signed   By: AEddie CandleM.D.   On: 08/27/2019 10:44    Medications: I have reviewed the patient's current  medications.  Assessment/Plan: 1. Non-small cell lung cancer  MRI lumbar spine 04/29/2019-enlarging marrow lesions involving the L1 vertebral body, upper left sacrum and right iliac bone  MRI pelvis 04/29/2019-3.5 cm left iliac bone lesion appears slightly larger; other similar appearing lesions present within the left superior pubic ramus, left superior abdomen acetabulum and upper left sacrum  Kappa  free light chains with mild elevation 05/12/2019  CTs10/07/2019-left lower lobe pulmonary mass 3.3 x 3.2 cm; lytic process left iliac bone; spinal lesions; 1.1 cm low-density left kidney lesion; right thyroid enlargement with heterogeneous appearance with potential for multiple discrete lesions  Biopsy left lower lobe lung mass 05/26/2019-poorly differentiated carcinoma; positive for cytokeratin 5/6, p63 and TTF-1, no EGFR, BRAF, ALK,ERBB2,ROS, orNTRKalteration  Cycle 1 carboplatin/Alimta/pembrolizumab 06/06/2019  Cycle 2 carboplatin/Alimta/pembrolizumab 06/27/2019  Cycle 3 carboplatin/Alimta/pembrolizumab 07/18/2019  Cycle 4 carboplatin/Alimta/pembrolizumab 08/07/2019  CTs 08/27/2019-significant decrease in size of lobulated mass left lower lobe.  Unchanged appearance of subtle bone lesions.  Cycle 5 Alimta/pembrolizumab 08/28/2019 2. Pain secondary to #1 3. Chronic back pain 4. Type 2 diabetes 5. Essential hypertension 6. CAD 7. Hyperlipidemia 8. Family history significant for multiple members with breast cancer 9. Grade 1 skin rash 07/18/2019 likely related to immunotherapy. Topical steroid cream as needed. 10. E. coli urinary tract infection 07/14/2019. Completing cephalexin.  Disposition: [name] appears stable.  She has completed 4 cycles of carboplatin/Alimta/pembrolizumab.  Restaging CTs show significant improvement in the mass at the left lower lobe.  Multiple bone lesions were unchanged.  Dr. Benay Spice recommends continuing treatment with Alimta/pembrolizumab,  discontinue carboplatin.  She agrees with this plan.  Plan to proceed with treatment today as scheduled.  We reviewed the CBC and chemistry panel from 08/27/2019.  Labs are adequate to proceed with treatment as above.  She will return for lab, follow-up, Alimta/pembrolizumab in 3 weeks.  Patient seen with Dr. Benay Spice.  CT images reviewed on the computer with [name].    Ned Card ANP/GNP-BC   08/28/2019  9:46 AM This was a shared visit with Ned Card.  We reviewed the restaging CT images with [name] and discussed treatment options.  The CT confirms a significant decrease in the left lung mass.  Her overall performance status appears improved.  We decided to change treatment to Alimta/pembrolizumab.  Tina Manson, MD

## 2019-09-02 ENCOUNTER — Telehealth: Payer: Self-pay | Admitting: Oncology

## 2019-09-02 NOTE — Telephone Encounter (Signed)
Scheduled per 1/28 los. Called and spoke with pt, confirmed 2/18 appt

## 2019-09-04 ENCOUNTER — Encounter: Payer: Self-pay | Admitting: Oncology

## 2019-09-05 ENCOUNTER — Encounter: Payer: Self-pay | Admitting: *Deleted

## 2019-09-09 ENCOUNTER — Ambulatory Visit: Payer: Medicare Other | Attending: Internal Medicine

## 2019-09-09 DIAGNOSIS — Z23 Encounter for immunization: Secondary | ICD-10-CM

## 2019-09-09 NOTE — Progress Notes (Signed)
   Covid-19 Vaccination Clinic  Name:  [name]    MRN: [id number] DOB: 19-Feb-1935  09/09/2019  [name] was observed post Covid-19 immunization for 15 minutes without incidence. She was provided with Vaccine Information Sheet and instruction to access the V-Safe system.   [name] was instructed to call 911 with any severe reactions post vaccine: Marland Kitchen Difficulty breathing  . Swelling of your face and throat  . A fast heartbeat  . A bad rash all over your body  . Dizziness and weakness    Immunizations Administered    Name Date Dose VIS Date Route   Pfizer COVID-19 Vaccine 09/09/2019  2:31 PM 0.3 mL 07/11/2019 Intramuscular   Manufacturer: [place]   Lot: [id number]   [place]: [id number]

## 2019-09-10 ENCOUNTER — Encounter: Payer: Self-pay | Admitting: Oncology

## 2019-09-10 NOTE — Progress Notes (Signed)
Pt was approved w/ the Patient Access Network for [place] for $4,800 from 06/10/19 to 09/06/20.

## 2019-09-12 ENCOUNTER — Ambulatory Visit: Payer: Medicare Other | Admitting: Cardiovascular Disease

## 2019-09-12 ENCOUNTER — Encounter: Payer: Self-pay | Admitting: Cardiovascular Disease

## 2019-09-12 ENCOUNTER — Other Ambulatory Visit: Payer: Self-pay

## 2019-09-12 DIAGNOSIS — R7303 Prediabetes: Secondary | ICD-10-CM

## 2019-09-12 DIAGNOSIS — I350 Nonrheumatic aortic (valve) stenosis: Secondary | ICD-10-CM

## 2019-09-12 DIAGNOSIS — C3492 Malignant neoplasm of unspecified part of left bronchus or lung: Secondary | ICD-10-CM

## 2019-09-12 DIAGNOSIS — I519 Heart disease, unspecified: Secondary | ICD-10-CM

## 2019-09-12 DIAGNOSIS — I2510 Atherosclerotic heart disease of native coronary artery without angina pectoris: Secondary | ICD-10-CM

## 2019-09-12 DIAGNOSIS — I10 Essential (primary) hypertension: Secondary | ICD-10-CM | POA: Diagnosis not present

## 2019-09-12 DIAGNOSIS — I5189 Other ill-defined heart diseases: Secondary | ICD-10-CM

## 2019-09-12 NOTE — Patient Instructions (Signed)
Medication Instructions:  CONTINUE WITH CURRENT MEDICATIONS. NO CHANGES.  *If you need a refill on your cardiac medications before your next appointment, please call your pharmacy*  Testing/Procedures: Your physician has requested that you have an echocardiogram. Echocardiography is a painless test that uses sound waves to create images of your heart. It provides your doctor with information about the size and shape of your heart and how well your heart's chambers and valves are working. This procedure takes approximately one hour. There are no restrictions for this procedure.  [place]  Follow-Up: At [facility], you and your health needs are our priority.  As part of our continuing mission to provide you with exceptional heart care, we have created designated Provider Care Teams.  These Care Teams include your primary Cardiologist (physician) and Advanced Practice Providers (APPs -  Physician Assistants and Nurse Practitioners) who all work together to provide you with the care you need, when you need it.  Your next appointment:   6 month(s)  The format for your next appointment:   In Person  Provider:   Shelva Majestic, MD

## 2019-09-12 NOTE — Progress Notes (Signed)
Patient ID: [name], female   DOB: [birth year], 85 y.o.   MRN: [id number]      HPI: [name] is a 85 y.o. female who presents to the office for a 12 month cardiology evaluation.  [name] has known CAD and in March 2012 underwent stenting of a 90% eccentric RCA stenosis with a 3.0x15 mm integrity bare-metal stent. She developed recurrent chest pain in July 2012 and repeat catheterization revealed widely patent stent. She did have 60% postural diagonal 1 stenosis and medical therapy was recommended. She had mild luminal irregularities of the LAD.  In 2012 an echo Doppler study did show normal systolic function with mild aortic sclerosis mild MR and mild TR. She has a history of hyperlipidemia, restless legs, as well as documented insulin resistance.  She developed a herniated disc in her neck and underwent surgery by Dr. Sherley Bounds on November 30 1 2014. She tolerated surgery well from a cardiovascular standpoint.  She has mild nerve damage from an episode of shingles which is improved with Neurontin. She does have restless legs which she takes Sinemet 25/100, one quarter of a tablet 2 times per day. She does have hyperlipidemia. She does have hypertension. She denies presyncope or syncope.    When I saw her in 2016 she had noticed development of rare chest pain when she walks fast or walks up a steep incline.  She underwent a nuclear perfusion study in October 2016 which showed normal perfusion and function without scar or ischemia.    She underwent eye surgery in  August 2017 involving her lacrimal duct.  She did have significant nosebleed following this.  She underwent a follow-up echo Doppler study in December 2017 which showed normal ejection fraction at 55-60% with grade 1 diastolic dysfunction.  There was aortic valve sclerosis without stenosis.  She tells me that her metformin was discontinued this past year by Dr. [name].  Laboratory had shown a hemoglobin  A1c of 6.1.  In May 2018 LDL cholesterol was 63 on rosuvastatin.  I last saw her in February 2019.  At that time her blood pressure was stable she has continued to be on lisinopril 5 mg daily, metoprolol 37.5 mg twice a day for hypertension.  She has peripheral neuropathy on gabapentin 300 mg daily.  She has restless legs, and also takes carbidopa levodopa 25/100 mg one quarter of a tablet twice a day with improvement in symptomatology.  She denied any recurrent anginal symptoms.  There is mild shortness of breath with activity.  She is active.  I encouraged at least 150 minutes/week of exercise.  I last saw her in February 2020.  At that time felt fairly well from a cardiac standpoint.  She continues to experience some mild shortness of breath with activity without significant change.  She was walking her dog at least 20 minutes a day basis.  She has been documented to have diastolic dysfunction on echocardiography.  She was under  increased stress with her husband's illness who in addition to his bronchiectasis also has developed leukemia.  Since I last saw her, she unfortunately was diagnosed with non-small cell lung CA, stage IV with metastatic bone lesion.  She is followed by Dr. Benay Spice.  She completed 4 cycles of carboplatin/Alimta/pembrolizumab on August 07, 2019.  She had a remote history of smoking and had smoked 1 pack/day for 25 years and quit smoking in 1990, 31 years ago.  Presently she denies any chest pain, PND orthopnea.  She presents for evaluation.    Past Medical History:  Diagnosis Date  . Aneurysm ([place])    Right eye a non DES stent was placed so that she would not required long term dual antiplatelet therapy.  . Anxiety    not currently taking any meds  . Arthritis   . CAD (coronary artery disease)   . Diabetes mellitus without complication (HCC)    prediabetes per pt  . H/O hiatal hernia   . Heart murmur   . History of stress test 03/2012   The post stress myocardial  perfusion images show a normal pattern of perfusion in all region. The post left ventricles is normal in size. There is no scintigraphic of inductible myocardial ischemia. The post EF is 73  . Hx of echocardiogram 11/2010   Ef 67% Normal size chambes, Aortic valve sclerosis without stenosis, No other significant valvular abnormalities, No percardial effusion.  . Hyperlipemia   . Hypertension   . Insulin resistance   . Multiple thyroid nodules   . Neuromuscular disorder (HCC)    nerve pain after shingles  . Osteoporosis   . Restless legs   . Shingles     Past Surgical History:  Procedure Laterality Date  . ABDOMINAL HYSTERECTOMY  1970  . ANGIOPLASTY     Stenting of a 90% eccentric right coronary artery stenosis and had a 3.0x15 mm Integrity bare-metal stent inserted  . ANTERIOR CERVICAL DECOMP/DISCECTOMY FUSION N/A 07/30/2013   Procedure: ANTERIOR CERVICAL DECOMPRESSION/DISCECTOMY FUSION CERVICAL FIVE -SIX;  Surgeon: Eustace Moore, MD;  Location: [facility];  Service: Neurosurgery;  Laterality: N/A;  . APPENDECTOMY    . BREAST SURGERY Left    cysts removed from left breast (in her 20'2)  . CARDIAC CATHETERIZATION     Showed a widely patent stent, she did have 60% ostial diagonal-1 stenosis, She also had mild luminal irregularities of her LAD.  Marland Kitchen COLONOSCOPY    . EYE SURGERY Bilateral 2009   cateract surgery- bilateral  . IR IMAGING GUIDED PORT INSERTION  06/04/2019  . REFRACTIVE SURGERY Left 2011  . TONSILLECTOMY      Allergies  Allergen Reactions  . Codeine Nausea And Vomiting and Other (See Comments)    Severe constipation.    Current Outpatient Medications  Medication Sig Dispense Refill  . acetaminophen (TYLENOL) 500 MG tablet Take 1,000 mg by mouth 2 (two) times daily as needed (pain).    Marland Kitchen ALPRAZolam (XANAX) 0.5 MG tablet Take 1 tablet by mouth 2 (two) times daily as needed for anxiety.     . carboxymethylcellulose (REFRESH PLUS) 0.5 % SOLN Apply 1 drop to eye 3  (three) times daily as needed.    . diclofenac sodium (VOLTAREN) 1 % GEL Apply 1 application topically 3 (three) times daily as needed (pain).    . folic acid (FOLVITE) 1 MG tablet Take 1 tablet (1 mg total) by mouth daily. 30 tablet 3  . gabapentin (NEURONTIN) 300 MG capsule Take 300 mg by mouth at bedtime.     Marland Kitchen lisinopril (ZESTRIL) 10 MG tablet TAKE 1 TABLET BY MOUTH  DAILY 90 tablet 0  . Magnesium 250 MG TABS Take 500 mg by mouth at bedtime.    . metFORMIN (GLUCOPHAGE-XR) 500 MG 24 hr tablet Take 500 mg by mouth at bedtime.     . metoprolol tartrate (LOPRESSOR) 25 MG tablet Take 25 mg by mouth at bedtime.     . Multiple Vitamins-Minerals (CENTRUM SILVER PO) Take 1 tablet by  mouth daily.    . naproxen sodium (ALEVE) 220 MG tablet Take 440 mg by mouth 2 (two) times daily as needed (pain).    . nitroGLYCERIN (NITROSTAT) 0.4 MG SL tablet Place 0.4 mg under the tongue every 5 (five) minutes as needed for chest pain.    Marland Kitchen ondansetron (ZOFRAN) 8 MG tablet Take 1 tablet (8 mg total) by mouth every 8 (eight) hours as needed for nausea or vomiting. 30 tablet 1  . polyethylene glycol (MIRALAX / GLYCOLAX) packet Take 17 g by mouth every morning.     Marland Kitchen rOPINIRole (REQUIP) 1 MG tablet Take 1 mg by mouth 2 (two) times daily. In the evening    . rosuvastatin (CRESTOR) 40 MG tablet Take 40 mg by mouth at bedtime.     . traMADol (ULTRAM) 50 MG tablet Take 50 mg by mouth every 6 (six) hours as needed for moderate pain.    . traZODone (DESYREL) 100 MG tablet Take 100 mg by mouth at bedtime.    . triamcinolone lotion (KENALOG) 0.1 % Apply to affected areas 2 times daily as needed. 60 mL 1  . Vitamin D, Ergocalciferol, (DRISDOL) 50000 UNITS CAPS capsule Take 50,000 Units by mouth every 7 (seven) days.    Marland Kitchen zinc gluconate 50 MG tablet Take 50 mg by mouth daily.    . nitrofurantoin, macrocrystal-monohydrate, (MACROBID) 100 MG capsule Take 1 capsule (100 mg total) by mouth 2 (two) times daily. X 7 days (Patient not  taking: Reported on 09/12/2019) 14 capsule 0   No current facility-administered medications for this visit.    Social History   Socioeconomic History  . Marital status: Married    Spouse name: Not on file  . Number of children: Not on file  . Years of education: Not on file  . Highest education level: Not on file  Occupational History  . Not on file  Tobacco Use  . Smoking status: Former Smoker    Packs/day: 2.00    Years: 33.00    Pack years: 66.00    Types: Cigarettes  . Smokeless tobacco: Never Used  . Tobacco comment: quit in 1995.  Substance and Sexual Activity  . Alcohol use: Yes    Alcohol/week: 0.0 standard drinks    Comment: occasional (maybe twice a year)  . Drug use: No  . Sexual activity: Not on file  Other Topics Concern  . Not on file  Social History Narrative  . Not on file   Social Determinants of Health   Financial Resource Strain:   . Difficulty of Paying Living Expenses: Not on file  Food Insecurity:   . Worried About Charity fundraiser in the Last Year: Not on file  . Ran Out of Food in the Last Year: Not on file  Transportation Needs:   . Lack of Transportation (Medical): Not on file  . Lack of Transportation (Non-Medical): Not on file  Physical Activity:   . Days of Exercise per Week: Not on file  . Minutes of Exercise per Session: Not on file  Stress:   . Feeling of Stress : Not on file  Social Connections:   . Frequency of Communication with Friends and Family: Not on file  . Frequency of Social Gatherings with Friends and Family: Not on file  . Attends Religious Services: Not on file  . Active Member of Clubs or Organizations: Not on file  . Attends Archivist Meetings: Not on file  . Marital Status: Not on  file  Intimate Partner Violence:   . Fear of Current or Ex-Partner: Not on file  . Emotionally Abused: Not on file  . Physically Abused: Not on file  . Sexually Abused: Not on file    Family History  Problem  Relation Age of Onset  . Alzheimer's disease Mother 56  . Colon polyps Mother   . Diabetes Mother   . Heart attack Father 61  . Cancer Maternal Grandmother 58  . Breast cancer Maternal Grandmother   . Diabetes Sister   . Breast cancer Maternal Uncle   . Breast cancer Maternal Aunt        x 2  . Colon polyps Sister   . Colon polyps Maternal Aunt   . Colon cancer Neg Hx   . Esophageal cancer Neg Hx   . Rectal cancer Neg Hx   . Stomach cancer Neg Hx    Socially she is married has one child and 2 grandchildren. There is no tobacco or alcohol use.  ROS General: Negative; No fevers, chills, or night sweats;  HEENT: Negative; No changes in vision or hearing, sinus congestion, difficulty swallowing Pulmonary: Positive for non-small cell lung cancer Cardiovascular: see HPI GI: GERD; No nausea, vomiting, diarrhea, or abdominal pain GU: Negative; No dysuria, hematuria, or difficulty voiding Musculoskeletal: Negative; no myalgias, joint pain, or weakness Hematologic/Oncology: Negative; no easy bruising, bleeding Endocrine: Negative; no heat/cold intolerance; no diabetes Neuro: Positive for restless legs Skin: Negative; No rashes or skin lesions Psychiatric: Negative; No behavioral problems, depression Sleep: Negative; No snoring, daytime sleepiness, hypersomnolence, bruxism, restless legs, hypnogognic hallucinations, no cataplexy Other comprehensive 14 point system review is negative   PE. BP (!) 153/71   Pulse (!) 59   Temp (!) 95.7 F (35.4 C)   Ht '5\' 5"'  (1.651 m)   Wt 178 lb 12.8 oz (81.1 kg)   SpO2 97%   BMI 29.75 kg/m   Repeat blood pressure by me was 138/80  Wt Readings from Last 3 Encounters:  09/12/19 178 lb 12.8 oz (81.1 kg)  08/28/19 177 lb 12.8 oz (80.6 kg)  08/21/19 175 lb 14.4 oz (79.8 kg)   General: Alert, oriented, no distress.  Skin: normal turgor, no rashes, warm and dry HEENT: Normocephalic, atraumatic. Pupils equal round and reactive to light; sclera  anicteric; extraocular muscles intact;  Nose without nasal septal hypertrophy Mouth/Parynx benign;  Neck: No JVD, no carotid bruits; normal carotid upstroke Lungs: clear to ausculatation and percussion; no wheezing or rales Chest wall: without tenderness to palpitation Heart: PMI not displaced, RRR, s1 s2 normal, 2 early peaking /6 systolic murmur, no diastolic murmur, no rubs, gallops, thrills, or heaves Abdomen: soft, nontender; no hepatosplenomehaly, BS+; abdominal aorta nontender and not dilated by palpation. Back: no CVA tenderness Pulses 2+ Musculoskeletal: full range of motion, normal strength, no joint deformities Extremities: no clubbing cyanosis or edema, Homan's sign negative  Neurologic: grossly nonfocal; Cranial nerves grossly wnl Psychologic: Normal mood and affect   ECG (independently read by me): Sinus bradycardia at 59; MIld LVH; normal intervals  January 2020 ECG (independently read by me): Sinus bradycardia 53 bpm with first-degree AV block..  Well to 10 ms.  LVH by voltage  February 2019 ECG (independently read by me): Sinus bradycardia at 59 bpm.  First degree AV block with a PR interval of 2:30 milliseconds.  No syncope ST-T changes.  November 2017 ECG (independently read by me): Normal sinus rhythm at 62 bpm.  No ectopy.  Normal intervals.  May  2017 ECG (independently read by me): Sinus bradycardia 55 beats per minute with first-degree AV block, PR interval 238 ms.  No ST segment changes.  September 2016 ECG (independently read by me): Sinus bradycardia at 55 bpm.  Nonspecific ST changes  March 2015 ECG (independently read by me): Sinus bradycardia 55 beats per minute. Nonspecific ST changes appear.  Prior 06/03/2013 ECG: Sinus rhythm with first degree AV block. QRS complex V1 V2. Nonspecific ST changes which are present previously. Borderline LVH by voltage in aVL  LABS: BMP Latest Ref Rng & Units 08/27/2019 08/07/2019 07/18/2019  Glucose 70 - 99 mg/dL 112(H)  147(H) 113(H)  BUN 8 - 23 mg/dL '19 22 19  ' Creatinine 0.44 - 1.00 mg/dL 0.81 0.90 0.92  Sodium 135 - 145 mmol/L 138 140 139  Potassium 3.5 - 5.1 mmol/L 4.9 4.1 4.2  Chloride 98 - 111 mmol/L 105 106 105  CO2 22 - 32 mmol/L '25 24 25  ' Calcium 8.9 - 10.3 mg/dL 9.3 9.3 9.0   Hepatic Function Latest Ref Rng & Units 08/27/2019 08/07/2019 07/18/2019  Total Protein 6.5 - 8.1 g/dL 6.9 6.9 6.7  Albumin 3.5 - 5.0 g/dL 4.1 4.2 3.9  AST 15 - 41 U/L '22 24 23  ' ALT 0 - 44 U/L '20 25 22  ' Alk Phosphatase 38 - 126 U/L 94 77 75  Total Bilirubin 0.3 - 1.2 mg/dL 0.3 0.3 <0.2(L)   CBC Latest Ref Rng & Units 08/27/2019 08/07/2019 07/18/2019  WBC 4.0 - 10.5 K/uL 6.0 3.0(L) 3.9(L)  Hemoglobin 12.0 - 15.0 g/dL 10.5(L) 10.6(L) 10.4(L)  Hematocrit 36.0 - 46.0 % 32.0(L) 32.5(L) 31.7(L)  Platelets 150 - 400 K/uL 223 176 201   Lab Results  Component Value Date   MCV 97.9 08/27/2019   MCV 96.4 08/07/2019   MCV 96.6 07/18/2019   Lab Results  Component Value Date   TSH 0.728 08/07/2019   Lab Results  Component Value Date   HGBA1C 6.8 (H) 02/02/2011   Lipid Panel     Component Value Date/Time   CHOL 129 04/13/2015 0802   CHOL 141 06/13/2013 0838   TRIG 87 04/13/2015 0802   TRIG 129 06/13/2013 0838   HDL 57 04/13/2015 0802   HDL 53 06/13/2013 0838   CHOLHDL 2.3 04/13/2015 0802   VLDL 17 04/13/2015 0802   LDLCALC 55 04/13/2015 0802   LDLCALC 62 06/13/2013 0838     RADIOLOGY: No results found.  IMPRESSION: 1. Coronary artery disease involving native coronary artery of native heart without angina pectoris   2. Essential hypertension   3. Grade I diastolic dysfunction   4. Nonrheumatic aortic valve stenosis   5. Prediabetes   6. Non-small cell cancer of left lung [facility])     ASSESSMENT AND PLAN: [name] is an 32 years ol female who has established CAD and underwent stenting of her RCA with a bare metal stent in March 2012 at which time she also was found to have a 60% ostial diagonal stenosis. She  has a history of hyperlipidemia.  An echo Doppler study in December 2014 showed an ejection fraction at 55-60% without wall motion abnormalities. There was mild aortic stenosis. Mitral valve was structurally normal.  Subsequently she had undergone a follow-up nuclear perfusion study due to symptoms of vague chest pain as well as exertional dyspnea which revealed normal perfusion.  Upper study was on July 27, 2016 and again showed LVEF at 55 to 60%.  There was grade 1 diastolic dysfunction.  There was  minimal PA pressure elevation at 32 mm.  There was mild aortic sclerosis without restrictive mobility.  There was no AR.  Presently, she has been on lisinopril 10 mg and metoprolol tartrate 25 mg for blood pressure control.  Although her blood pressure was elevated upon presentation today on repeat by me her blood pressure was excellent at 128/68.  Unfortunately she was found to have non-small cell lung CA with metastatic bone lesion for which she has been undergoing treatment, followed by Dr. Benay Spice.  On exam, she has a early peaking systolic murmur consistent with probably mild aortic stenosis.  She has not had an echo Doppler study since 2017.  I have recommended that she undergo a follow-up echo evaluation particularly following her recent chemotherapy and also to further assess her systolic and diastolic function as well as aortic stenosis.  She is on rosuvastatin for hyperlipidemia and lipid studies in May 2020 showed an LDL of 56 on 40 mg daily.  She is diabetic on metformin.  Her ECG shows sinus bradycardia at 59 bpm.  She has a history of prediabetes with most recent hemoglobin A1c 6.1 in May 2020.  She is followed by Dr. Bevelyn Buckles for primary care.  I will see her in 6 months for reevaluation or sooner if problems arise.  Troy Sine, MD, [facility]  09/13/2019 4:12 PM

## 2019-09-13 ENCOUNTER — Encounter: Payer: Self-pay | Admitting: Cardiovascular Disease

## 2019-09-14 ENCOUNTER — Other Ambulatory Visit: Payer: Self-pay | Admitting: Oncology

## 2019-09-17 ENCOUNTER — Encounter: Payer: Self-pay | Admitting: Oncology

## 2019-09-18 ENCOUNTER — Inpatient Hospital Stay: Payer: Medicare Other | Attending: Oncology

## 2019-09-18 ENCOUNTER — Inpatient Hospital Stay: Payer: Medicare Other

## 2019-09-18 ENCOUNTER — Inpatient Hospital Stay: Payer: Medicare Other | Admitting: Oncology

## 2019-09-18 ENCOUNTER — Other Ambulatory Visit: Payer: Self-pay

## 2019-09-18 ENCOUNTER — Other Ambulatory Visit: Payer: Self-pay | Admitting: *Deleted

## 2019-09-18 VITALS — BP 158/54 | HR 59 | Temp 98.0°F | Resp 17 | Ht 65.0 in | Wt 177.7 lb

## 2019-09-18 DIAGNOSIS — Z79899 Other long term (current) drug therapy: Secondary | ICD-10-CM | POA: Insufficient documentation

## 2019-09-18 DIAGNOSIS — C3492 Malignant neoplasm of unspecified part of left bronchus or lung: Secondary | ICD-10-CM

## 2019-09-18 DIAGNOSIS — Z5111 Encounter for antineoplastic chemotherapy: Secondary | ICD-10-CM | POA: Insufficient documentation

## 2019-09-18 DIAGNOSIS — Z5112 Encounter for antineoplastic immunotherapy: Secondary | ICD-10-CM | POA: Insufficient documentation

## 2019-09-18 DIAGNOSIS — C3432 Malignant neoplasm of lower lobe, left bronchus or lung: Secondary | ICD-10-CM | POA: Diagnosis present

## 2019-09-18 DIAGNOSIS — Z95828 Presence of other vascular implants and grafts: Secondary | ICD-10-CM

## 2019-09-18 LAB — CMP (CANCER CENTER ONLY)
ALT: 28 U/L (ref 0–44)
AST: 33 U/L (ref 15–41)
Albumin: 4.2 g/dL (ref 3.5–5.0)
Alkaline Phosphatase: 84 U/L (ref 38–126)
Anion gap: 7 (ref 5–15)
BUN: 17 mg/dL (ref 8–23)
CO2: 25 mmol/L (ref 22–32)
Calcium: 9.9 mg/dL (ref 8.9–10.3)
Chloride: 105 mmol/L (ref 98–111)
Creatinine: 0.8 mg/dL (ref 0.44–1.00)
GFR, Est AFR Am: >60 mL/min (ref >60)
GFR, Estimated: >60 mL/min (ref >60)
Glucose, Bld: 89 mg/dL (ref 70–99)
Potassium: 4.8 mmol/L (ref 3.5–5.1)
Sodium: 137 mmol/L (ref 135–145)
Total Bilirubin: 0.3 mg/dL (ref 0.3–1.2)
Total Protein: 7.5 g/dL (ref 6.5–8.1)

## 2019-09-18 LAB — CBC WITH DIFFERENTIAL (CANCER CENTER ONLY)
Abs Immature Granulocytes: 0.02 10*3/uL (ref 0.00–0.07)
Basophils Absolute: 0 10*3/uL (ref 0.0–0.1)
Basophils Relative: 0 %
Eosinophils Absolute: 0.1 10*3/uL (ref 0.0–0.5)
Eosinophils Relative: 2 %
HCT: 33.2 % — ABNORMAL LOW (ref 36.0–46.0)
Hemoglobin: 10.8 g/dL — ABNORMAL LOW (ref 12.0–15.0)
Immature Granulocytes: 0 %
Lymphocytes Relative: 38 %
Lymphs Abs: 2.1 10*3/uL (ref 0.7–4.0)
MCH: 32.8 pg (ref 26.0–34.0)
MCHC: 32.5 g/dL (ref 30.0–36.0)
MCV: 100.9 fL — ABNORMAL HIGH (ref 80.0–100.0)
Monocytes Absolute: 0.5 10*3/uL (ref 0.1–1.0)
Monocytes Relative: 9 %
Neutro Abs: 2.8 10*3/uL (ref 1.7–7.7)
Neutrophils Relative %: 51 %
Platelet Count: 214 10*3/uL (ref 150–400)
RBC: 3.29 MIL/uL — ABNORMAL LOW (ref 3.87–5.11)
RDW: 15.4 % (ref 11.5–15.5)
WBC Count: 5.6 10*3/uL (ref 4.0–10.5)
nRBC: 0 % (ref 0.0–0.2)

## 2019-09-18 LAB — TSH: TSH: 0.725 u[IU]/mL (ref 0.308–3.960)

## 2019-09-18 MED ORDER — SODIUM CHLORIDE 0.9 % IV SOLN
1000.0000 mg | Freq: Once | INTRAVENOUS | Status: AC
  Administered 2019-09-18: 1000 mg via INTRAVENOUS
  Filled 2019-09-18: qty 40

## 2019-09-18 MED ORDER — SODIUM CHLORIDE 0.9% FLUSH
10.0000 mL | Freq: Once | INTRAVENOUS | Status: AC
  Administered 2019-09-18: 10 mL
  Filled 2019-09-18: qty 10

## 2019-09-18 MED ORDER — SODIUM CHLORIDE 0.9% FLUSH
10.0000 mL | INTRAVENOUS | Status: DC | PRN
  Administered 2019-09-18: 10 mL
  Filled 2019-09-18: qty 10

## 2019-09-18 MED ORDER — CYANOCOBALAMIN 1000 MCG/ML IJ SOLN
INTRAMUSCULAR | Status: AC
  Filled 2019-09-18: qty 1

## 2019-09-18 MED ORDER — PROCHLORPERAZINE MALEATE 10 MG PO TABS
ORAL_TABLET | ORAL | Status: AC
  Filled 2019-09-18: qty 1

## 2019-09-18 MED ORDER — SODIUM CHLORIDE 0.9 % IV SOLN
200.0000 mg | Freq: Once | INTRAVENOUS | Status: AC
  Administered 2019-09-18: 200 mg via INTRAVENOUS
  Filled 2019-09-18: qty 8

## 2019-09-18 MED ORDER — KETOROLAC TROMETHAMINE 10 MG PO TABS: 10.0000 mg | ORAL_TABLET | Freq: Every day | ORAL | 0 refills | Status: DC | PRN

## 2019-09-18 MED ORDER — CYANOCOBALAMIN 1000 MCG/ML IJ SOLN
1000.0000 ug | Freq: Once | INTRAMUSCULAR | Status: AC
  Administered 2019-09-18: 1000 ug via INTRAMUSCULAR

## 2019-09-18 MED ORDER — SODIUM CHLORIDE 0.9 % IV SOLN
Freq: Once | INTRAVENOUS | Status: AC
  Filled 2019-09-18: qty 250

## 2019-09-18 MED ORDER — HEPARIN SOD (PORK) LOCK FLUSH 100 UNIT/ML IV SOLN
500.0000 [IU] | Freq: Once | INTRAVENOUS | Status: AC | PRN
  Administered 2019-09-18: 500 [IU]
  Filled 2019-09-18: qty 5

## 2019-09-18 MED ORDER — PROCHLORPERAZINE MALEATE 10 MG PO TABS
10.0000 mg | ORAL_TABLET | Freq: Once | ORAL | Status: AC
  Administered 2019-09-18: 13:00:00 10 mg via ORAL

## 2019-09-18 NOTE — Progress Notes (Signed)
Script for Toradol sent to pharmacy per Dr. Benay Spice request.

## 2019-09-18 NOTE — Progress Notes (Signed)
[name] OFFICE PROGRESS NOTE   Diagnosis: Non-small cell lung cancer  INTERVAL HISTORY:   [name] returns as scheduled.  She completed another cycle of Alimta/pembrolizumab on 08/28/2019.  No nausea or rash.  She reports having a rash, fever, and chills following the second COVID-19 vaccine.  The symptoms lasted for approximately 8 hours and resolved.  She has noted increased pain at the mid back and hips for the past several weeks.  She takes Tylenol, ibuprofen, and Aleve.  She would like something stronger, but does not wish to take a narcotic.  She saw [name] and reports the dysuria is related to a mechanical bladder issue and not infection.  Objective:  Vital signs in last 24 hours:  Blood pressure (!) 158/54, pulse (!) 59, temperature 98 F (36.7 C), temperature source Temporal, resp. rate 17, height 5' 5" (1.651 m), weight 177 lb 11.2 oz (80.6 kg), SpO2 96 %.    Limited physical examination secondary to distancing with the Covid pandemic GI: No hepatosplenomegaly, nontender Vascular: No leg edema Musculoskeletal: No tenderness over the spine.  Improved with motion of the right hip.    Portacath/PICC-without erythema  Lab Results:  Lab Results  Component Value Date   WBC 5.6 09/18/2019   HGB 10.8 (L) 09/18/2019   HCT 33.2 (L) 09/18/2019   MCV 100.9 (H) 09/18/2019   PLT 214 09/18/2019   NEUTROABS 2.8 09/18/2019    CMP  Lab Results  Component Value Date   NA 137 09/18/2019   K 4.8 09/18/2019   CL 105 09/18/2019   CO2 25 09/18/2019   GLUCOSE 89 09/18/2019   BUN 17 09/18/2019   CREATININE 0.80 09/18/2019   CALCIUM 9.9 09/18/2019   PROT 7.5 09/18/2019   ALBUMIN 4.2 09/18/2019   AST 33 09/18/2019   ALT 28 09/18/2019   ALKPHOS 84 09/18/2019   BILITOT 0.3 09/18/2019   GFRNONAA >60 09/18/2019   GFRAA >60 09/18/2019     Medications: I have reviewed the patient's current medications.   Assessment/Plan: 1. Non-small cell lung  cancer  MRI lumbar spine 04/29/2019-enlarging marrow lesions involving the L1 vertebral body, upper left sacrum and right iliac bone  MRI pelvis 04/29/2019-3.5 cm left iliac bone lesion appears slightly larger; other similar appearing lesions present within the left superior pubic ramus, left superior abdomen acetabulum and upper left sacrum  Kappa free light chains with mild elevation 05/12/2019  CTs10/07/2019-left lower lobe pulmonary mass 3.3 x 3.2 cm; lytic process left iliac bone; spinal lesions; 1.1 cm low-density left kidney lesion; right thyroid enlargement with heterogeneous appearance with potential for multiple discrete lesions  Biopsy left lower lobe lung mass 05/26/2019-poorly differentiated carcinoma; positive for cytokeratin 5/6, p63 and TTF-1, no EGFR, BRAF, ALK,ERBB2,ROS, orNTRKalteration  Cycle 1 carboplatin/Alimta/pembrolizumab 06/06/2019  Cycle 2 carboplatin/Alimta/pembrolizumab 06/27/2019  Cycle 3 carboplatin/Alimta/pembrolizumab 07/18/2019  Cycle 4 carboplatin/Alimta/pembrolizumab 08/07/2019  CTs 08/27/2019-significant decrease in size of lobulated mass left lower lobe.  Unchanged appearance of subtle bone lesions.  Cycle 5 Alimta/pembrolizumab 08/28/2019  Cycle 6 Alimta/pembrolizumab 09/18/2019 2. Pain secondary to #1 3. Chronic back pain 4. Type 2 diabetes 5. Essential hypertension 6. CAD 7. Hyperlipidemia 8. Family history significant for multiple members with breast cancer 9. Grade 1 skin rash 07/18/2019 likely related to immunotherapy. Topical steroid cream as needed. 10. E. coli urinary tract infection 07/14/2019. Completing cephalexin.    Disposition: [name] appears unchanged.  She is tolerating the systemic therapy well.  The pain may be related to degenerative   arthritis or cancer pain.  She would like to try a more potent analgesic, but not wish to use narcotics.  She will try low-dose Toradol once daily as needed.  She will not take  ibuprofen and other nonsteroidal anti-inflammatory medications while on Toradol.  [name] will return for an office visit and chemotherapy in 3 weeks.  Tina Sherrill, MD  09/18/2019  12:46 PM   

## 2019-09-18 NOTE — Patient Instructions (Signed)
[facility] Discharge Instructions for Patients Receiving Chemotherapy  Today you received the following chemotherapy agents Keytruda, Alimta.  To help prevent nausea and vomiting after your treatment, we encourage you to take your nausea medication    If you develop nausea and vomiting that is not controlled by your nausea medication, call the clinic.   BELOW ARE SYMPTOMS THAT SHOULD BE REPORTED IMMEDIATELY:  *FEVER GREATER THAN 100.5 F  *CHILLS WITH OR WITHOUT FEVER  NAUSEA AND VOMITING THAT IS NOT CONTROLLED WITH YOUR NAUSEA MEDICATION  *UNUSUAL SHORTNESS OF BREATH  *UNUSUAL BRUISING OR BLEEDING  TENDERNESS IN MOUTH AND THROAT WITH OR WITHOUT PRESENCE OF ULCERS  *URINARY PROBLEMS  *BOWEL PROBLEMS  UNUSUAL RASH Items with * indicate a potential emergency and should be followed up as soon as possible.  Feel free to call the clinic should you have any questions or concerns. The clinic phone number is [phone].  Please show the [facility] at check-in to the Emergency Department and triage nurse.

## 2019-09-19 ENCOUNTER — Telehealth: Payer: Self-pay | Admitting: Oncology

## 2019-09-19 ENCOUNTER — Encounter: Payer: Self-pay | Admitting: *Deleted

## 2019-09-19 NOTE — Telephone Encounter (Signed)
Scheduled per los. Called and left msg. Mailed printout  °

## 2019-09-19 NOTE — Progress Notes (Signed)
Received fax from [facility] that Toradol requires PA from insurance. Forwarded to [employer], Therapist, sports to process.

## 2019-09-26 ENCOUNTER — Other Ambulatory Visit: Payer: Self-pay

## 2019-09-26 ENCOUNTER — Ambulatory Visit (HOSPITAL_COMMUNITY): Payer: Medicare Other | Attending: Internal Medicine

## 2019-09-26 DIAGNOSIS — R7303 Prediabetes: Secondary | ICD-10-CM | POA: Diagnosis present

## 2019-09-26 DIAGNOSIS — I2510 Atherosclerotic heart disease of native coronary artery without angina pectoris: Secondary | ICD-10-CM | POA: Insufficient documentation

## 2019-09-26 DIAGNOSIS — I5189 Other ill-defined heart diseases: Secondary | ICD-10-CM

## 2019-09-26 DIAGNOSIS — I519 Heart disease, unspecified: Secondary | ICD-10-CM | POA: Diagnosis present

## 2019-09-26 DIAGNOSIS — I10 Essential (primary) hypertension: Secondary | ICD-10-CM | POA: Diagnosis present

## 2019-10-04 ENCOUNTER — Other Ambulatory Visit: Payer: Self-pay | Admitting: Nurse Practitioner

## 2019-10-04 DIAGNOSIS — M899 Disorder of bone, unspecified: Secondary | ICD-10-CM

## 2019-10-04 DIAGNOSIS — R918 Other nonspecific abnormal finding of lung field: Secondary | ICD-10-CM

## 2019-10-05 ENCOUNTER — Other Ambulatory Visit: Payer: Self-pay | Admitting: Oncology

## 2019-10-09 ENCOUNTER — Inpatient Hospital Stay: Payer: Medicare Other

## 2019-10-09 ENCOUNTER — Encounter: Payer: Self-pay | Admitting: Nurse Practitioner

## 2019-10-09 ENCOUNTER — Inpatient Hospital Stay: Payer: Medicare Other | Attending: Nurse Practitioner | Admitting: Nurse Practitioner

## 2019-10-09 ENCOUNTER — Other Ambulatory Visit: Payer: Self-pay

## 2019-10-09 VITALS — BP 137/56 | HR 61 | Temp 98.2°F | Resp 17 | Ht 65.0 in | Wt 179.1 lb

## 2019-10-09 DIAGNOSIS — R609 Edema, unspecified: Secondary | ICD-10-CM | POA: Diagnosis not present

## 2019-10-09 DIAGNOSIS — Z5112 Encounter for antineoplastic immunotherapy: Secondary | ICD-10-CM | POA: Diagnosis present

## 2019-10-09 DIAGNOSIS — Z5111 Encounter for antineoplastic chemotherapy: Secondary | ICD-10-CM | POA: Diagnosis present

## 2019-10-09 DIAGNOSIS — C3492 Malignant neoplasm of unspecified part of left bronchus or lung: Secondary | ICD-10-CM

## 2019-10-09 DIAGNOSIS — C3432 Malignant neoplasm of lower lobe, left bronchus or lung: Secondary | ICD-10-CM | POA: Diagnosis present

## 2019-10-09 DIAGNOSIS — L539 Erythematous condition, unspecified: Secondary | ICD-10-CM | POA: Diagnosis not present

## 2019-10-09 LAB — CMP (CANCER CENTER ONLY)
ALT: 23 U/L (ref 0–44)
AST: 30 U/L (ref 15–41)
Albumin: 3.8 g/dL (ref 3.5–5.0)
Alkaline Phosphatase: 88 U/L (ref 38–126)
Anion gap: 8 (ref 5–15)
BUN: 21 mg/dL (ref 8–23)
CO2: 24 mmol/L (ref 22–32)
Calcium: 9.6 mg/dL (ref 8.9–10.3)
Chloride: 106 mmol/L (ref 98–111)
Creatinine: 0.83 mg/dL (ref 0.44–1.00)
GFR, Est AFR Am: >60 mL/min (ref >60)
GFR, Estimated: >60 mL/min (ref >60)
Glucose, Bld: 104 mg/dL — ABNORMAL HIGH (ref 70–99)
Potassium: 4.7 mmol/L (ref 3.5–5.1)
Sodium: 138 mmol/L (ref 135–145)
Total Bilirubin: <0.2 mg/dL — ABNORMAL LOW (ref 0.3–1.2)
Total Protein: 7.2 g/dL (ref 6.5–8.1)

## 2019-10-09 LAB — CBC WITH DIFFERENTIAL (CANCER CENTER ONLY)
Abs Immature Granulocytes: 0.01 10*3/uL (ref 0.00–0.07)
Basophils Absolute: 0 10*3/uL (ref 0.0–0.1)
Basophils Relative: 1 %
Eosinophils Absolute: 0.1 10*3/uL (ref 0.0–0.5)
Eosinophils Relative: 1 %
HCT: 32.5 % — ABNORMAL LOW (ref 36.0–46.0)
Hemoglobin: 10.7 g/dL — ABNORMAL LOW (ref 12.0–15.0)
Immature Granulocytes: 0 %
Lymphocytes Relative: 28 %
Lymphs Abs: 1.2 10*3/uL (ref 0.7–4.0)
MCH: 32.7 pg (ref 26.0–34.0)
MCHC: 32.9 g/dL (ref 30.0–36.0)
MCV: 99.4 fL (ref 80.0–100.0)
Monocytes Absolute: 0.5 10*3/uL (ref 0.1–1.0)
Monocytes Relative: 11 %
Neutro Abs: 2.6 10*3/uL (ref 1.7–7.7)
Neutrophils Relative %: 59 %
Platelet Count: 232 10*3/uL (ref 150–400)
RBC: 3.27 MIL/uL — ABNORMAL LOW (ref 3.87–5.11)
RDW: 14.8 % (ref 11.5–15.5)
WBC Count: 4.3 10*3/uL (ref 4.0–10.5)
nRBC: 0 % (ref 0.0–0.2)

## 2019-10-09 MED ORDER — PROCHLORPERAZINE MALEATE 10 MG PO TABS
ORAL_TABLET | ORAL | Status: AC
  Filled 2019-10-09: qty 1

## 2019-10-09 MED ORDER — SODIUM CHLORIDE 0.9 % IV SOLN
200.0000 mg | Freq: Once | INTRAVENOUS | Status: AC
  Administered 2019-10-09: 11:00:00 200 mg via INTRAVENOUS
  Filled 2019-10-09: qty 8

## 2019-10-09 MED ORDER — SODIUM CHLORIDE 0.9% FLUSH
10.0000 mL | INTRAVENOUS | Status: DC | PRN
  Administered 2019-10-09: 12:00:00 10 mL
  Filled 2019-10-09: qty 10

## 2019-10-09 MED ORDER — SODIUM CHLORIDE 0.9 % IV SOLN
1000.0000 mg | Freq: Once | INTRAVENOUS | Status: AC
  Administered 2019-10-09: 12:00:00 1000 mg via INTRAVENOUS
  Filled 2019-10-09: qty 40

## 2019-10-09 MED ORDER — SODIUM CHLORIDE 0.9 % IV SOLN
Freq: Once | INTRAVENOUS | Status: AC
  Filled 2019-10-09: qty 250

## 2019-10-09 MED ORDER — PROCHLORPERAZINE MALEATE 10 MG PO TABS
10.0000 mg | ORAL_TABLET | Freq: Once | ORAL | Status: AC
  Administered 2019-10-09: 11:00:00 10 mg via ORAL

## 2019-10-09 MED ORDER — HEPARIN SOD (PORK) LOCK FLUSH 100 UNIT/ML IV SOLN
500.0000 [IU] | Freq: Once | INTRAVENOUS | Status: AC | PRN
  Administered 2019-10-09: 12:00:00 500 [IU]
  Filled 2019-10-09: qty 5

## 2019-10-09 NOTE — Progress Notes (Signed)
  [place] OFFICE PROGRESS NOTE   Diagnosis: Non-small cell lung cancer  INTERVAL HISTORY:   [name] returns as scheduled.  She completed a cycle of Alimta/pembrolizumab 09/18/2019.  She tends to have mild nausea around day 3.  No mouth sores.  No diarrhea.  No rash.  She denies fever.  No cough or shortness of breath.  She describes her pain as "okay".  She thinks the pain is likely due to arthritis.  She tried Toradol with some relief.  Objective:  Vital signs in last 24 hours:  Blood pressure (!) 137/56, pulse 61, temperature 98.2 F (36.8 C), temperature source Temporal, resp. rate 17, height '5\' 5"'$  (1.651 m), weight 179 lb 1.6 oz (81.2 kg).    HEENT: No thrush or ulcers. Resp: Lungs clear bilaterally. Cardio: Regular rate and rhythm. GI: Abdomen soft and nontender.  No hepatomegaly. Vascular: No leg edema.  Skin: No rash. Port-A-Cath without erythema.   Lab Results:  Lab Results  Component Value Date   WBC 4.3 10/09/2019   HGB 10.7 (L) 10/09/2019   HCT 32.5 (L) 10/09/2019   MCV 99.4 10/09/2019   PLT 232 10/09/2019   NEUTROABS 2.6 10/09/2019    Imaging:  No results found.  Medications: I have reviewed the patient's current medications.  Assessment/Plan: 1. Non-small cell lung cancer  MRI lumbar spine 04/29/2019-enlarging marrow lesions involving the L1 vertebral body, upper left sacrum and right iliac bone  MRI pelvis 04/29/2019-3.5 cm left iliac bone lesion appears slightly larger; other similar appearing lesions present within the left superior pubic ramus, left superior abdomen acetabulum and upper left sacrum  Kappa free light chains with mild elevation 05/12/2019  CTs10/07/2019-left lower lobe pulmonary mass 3.3 x 3.2 cm; lytic process left iliac bone; spinal lesions; 1.1 cm low-density left kidney lesion; right thyroid enlargement with heterogeneous appearance with potential for multiple discrete lesions  Biopsy left lower lobe  lung mass 05/26/2019-poorly differentiated carcinoma; positive for cytokeratin 5/6, p63 and TTF-1, no EGFR, BRAF, ALK,ERBB2,ROS, orNTRKalteration  Cycle 1 carboplatin/Alimta/pembrolizumab 06/06/2019  Cycle 2 carboplatin/Alimta/pembrolizumab 06/27/2019  Cycle 3 carboplatin/Alimta/pembrolizumab 07/18/2019  Cycle 4 carboplatin/Alimta/pembrolizumab1/01/2020  CTs 08/27/2019-significant decrease in size of lobulated mass left lower lobe.  Unchanged appearance of subtle bone lesions.  Cycle 5 Alimta/pembrolizumab 08/28/2019  Cycle 6 Alimta/pembrolizumab 09/18/2019  Cycle 7 Alimta/pembrolizumab 10/09/2019 2. Pain secondary to #1 3. Chronic back pain 4. Type 2 diabetes 5. Essential hypertension 6. CAD 7. Hyperlipidemia 8. Family history significant for multiple members with breast cancer 9. Grade 1 skin rash 07/18/2019 likely related to immunotherapy. Topical steroid cream as needed. 10. E. coli urinary tract infection 07/14/2019. Completed cephalexin.  Disposition: [name] appears stable.  She has completed 6 cycles of Alimta/pembrolizumab.  There is no clinical evidence of disease progression.  Plan to proceed with cycle 7 today as scheduled.  We reviewed the CBC from today.  Counts adequate to proceed with treatment.  She will return for lab, follow-up, Alimta/pembrolizumab in 3 weeks.  She will contact the office in the interim with any problems.    Ned Card ANP/GNP-BC   10/09/2019  10:01 AM

## 2019-10-09 NOTE — Patient Instructions (Signed)
[facility] Discharge Instructions for Patients Receiving Chemotherapy  Today you received the following chemotherapy agents:  Pembrolizumab Beryle Flock), Pemetrexed (Alimta)  To help prevent nausea and vomiting after your treatment, we encourage you to take your nausea medication as prescribed.   If you develop nausea and vomiting that is not controlled by your nausea medication, call the clinic.   BELOW ARE SYMPTOMS THAT SHOULD BE REPORTED IMMEDIATELY:  *FEVER GREATER THAN 100.5 F  *CHILLS WITH OR WITHOUT FEVER  NAUSEA AND VOMITING THAT IS NOT CONTROLLED WITH YOUR NAUSEA MEDICATION  *UNUSUAL SHORTNESS OF BREATH  *UNUSUAL BRUISING OR BLEEDING  TENDERNESS IN MOUTH AND THROAT WITH OR WITHOUT PRESENCE OF ULCERS  *URINARY PROBLEMS  *BOWEL PROBLEMS  UNUSUAL RASH Items with * indicate a potential emergency and should be followed up as soon as possible.  Feel free to call the clinic should you have any questions or concerns. The clinic phone number is [phone].  Please show the [facility] at check-in to the Emergency Department and triage nurse.

## 2019-10-20 ENCOUNTER — Telehealth: Payer: Self-pay

## 2019-10-20 NOTE — Telephone Encounter (Signed)
Spoke with patient to confirm appointment time tomorrow, 10/21/19, at 0930 with MD Benay Spice. Pt verbalizes understanding and agrees with plan of care

## 2019-10-20 NOTE — Telephone Encounter (Signed)
Call placed to patient as requested. Pt c/o swelling in lower extremities, bilaterally beginning Friday (3/19). Pt reports that ankles are swollen and red and that "there is a rash under her skin that you can't feel". Pt confirms that she would like an appointment with a provider at [facility] for evaluation. Pt verbalizes understanding that someone from the scheduling department will reach out to her with an appointment time.

## 2019-10-21 ENCOUNTER — Inpatient Hospital Stay: Payer: Medicare Other | Admitting: Oncology

## 2019-10-21 ENCOUNTER — Other Ambulatory Visit: Payer: Self-pay

## 2019-10-21 VITALS — BP 147/62 | HR 97 | Temp 98.5°F | Resp 16 | Ht 65.0 in | Wt 178.6 lb

## 2019-10-21 DIAGNOSIS — Z5112 Encounter for antineoplastic immunotherapy: Secondary | ICD-10-CM | POA: Diagnosis not present

## 2019-10-21 DIAGNOSIS — C3492 Malignant neoplasm of unspecified part of left bronchus or lung: Secondary | ICD-10-CM

## 2019-10-21 MED ORDER — DOXYCYCLINE HYCLATE 100 MG PO TABS: 100.0000 mg | ORAL_TABLET | Freq: Two times a day (BID) | ORAL | 0 refills | Status: AC

## 2019-10-21 NOTE — Progress Notes (Signed)
[place] OFFICE PROGRESS NOTE   Diagnosis: Non-small cell lung cancer  INTERVAL HISTORY:   [name] returns prior to a scheduled visit.  She completed another cycle of Alimta/pembrolizumab on 10/09/2019.  She reports mild nausea for several days following treatment.  Beginning on 10/16/2019 she developed swelling at the ankles.  She has progressive erythema and pain in these areas for the past several days.  She has a few erythematous circular lesions over the legs and arms that have been present for months.  No fever.  The hip pain has improved.  Objective:  Vital signs in last 24 hours:  Blood pressure (!) 147/62, pulse 97, temperature 98.5 F (36.9 C), temperature source Temporal, resp. rate 16, height '5\' 5"'  (1.651 m), weight 178 lb 9.6 oz (81 kg), SpO2 98 %.     Vascular: Trace lower pretibial edema bilaterally  Skin: 1-2 cm circular erythematous lesions over the forearms and lower legs that appear to be healing.  There is erythema and edema at the right greater than left ankle.  Tenderness at both ankles.  No palpable cord.  Portacath/PICC-without erythema  Lab Results:  Lab Results  Component Value Date   WBC 4.3 10/09/2019   HGB 10.7 (L) 10/09/2019   HCT 32.5 (L) 10/09/2019   MCV 99.4 10/09/2019   PLT 232 10/09/2019   NEUTROABS 2.6 10/09/2019    CMP  Lab Results  Component Value Date   NA 138 10/09/2019   K 4.7 10/09/2019   CL 106 10/09/2019   CO2 24 10/09/2019   GLUCOSE 104 (H) 10/09/2019   BUN 21 10/09/2019   CREATININE 0.83 10/09/2019   CALCIUM 9.6 10/09/2019   PROT 7.2 10/09/2019   ALBUMIN 3.8 10/09/2019   AST 30 10/09/2019   ALT 23 10/09/2019   ALKPHOS 88 10/09/2019   BILITOT <0.2 (L) 10/09/2019   GFRNONAA >60 10/09/2019   GFRAA >60 10/09/2019     Medications: I have reviewed the patient's current medications.   Assessment/Plan: 1. Non-small cell lung cancer  MRI lumbar spine 04/29/2019-enlarging marrow lesions  involving the L1 vertebral body, upper left sacrum and right iliac bone  MRI pelvis 04/29/2019-3.5 cm left iliac bone lesion appears slightly larger; other similar appearing lesions present within the left superior pubic ramus, left superior abdomen acetabulum and upper left sacrum  Kappa free light chains with mild elevation 05/12/2019  CTs10/07/2019-left lower lobe pulmonary mass 3.3 x 3.2 cm; lytic process left iliac bone; spinal lesions; 1.1 cm low-density left kidney lesion; right thyroid enlargement with heterogeneous appearance with potential for multiple discrete lesions  Biopsy left lower lobe lung mass 05/26/2019-poorly differentiated carcinoma; positive for cytokeratin 5/6, p63 and TTF-1, no EGFR, BRAF, ALK,ERBB2,ROS, orNTRKalteration  Cycle 1 carboplatin/Alimta/pembrolizumab 06/06/2019  Cycle 2 carboplatin/Alimta/pembrolizumab 06/27/2019  Cycle 3 carboplatin/Alimta/pembrolizumab 07/18/2019  Cycle 4 carboplatin/Alimta/pembrolizumab1/01/2020  CTs 08/27/2019-significant decrease in size of lobulated mass left lower lobe.  Unchanged appearance of subtle bone lesions.  Cycle 5 Alimta/pembrolizumab 08/28/2019  Cycle 6 Alimta/pembrolizumab 09/18/2019  Cycle 7 Alimta/pembrolizumab 10/09/2019 2. Pain secondary to #1 3. Chronic back pain 4. Type 2 diabetes 5. Essential hypertension 6. CAD 7. Hyperlipidemia 8. Family history significant for multiple members with breast cancer 9. Grade 1 skin rash 07/18/2019 likely related to immunotherapy. Topical steroid cream as needed. 10. E. coli urinary tract infection 07/14/2019. Completed cephalexin. 11. Edema/swelling at the right greater than left ankle 10/21/2019-etiology unclear, potentially related to systemic therapy or an infection, doxycycline prescribed    Disposition: [name]  has metastatic non-small cell lung cancer, currently on treatment with Alimta/pembrolizumab.  She presents today with new onset edema and  erythema at both ankles.  The etiology of this new finding is unclear.  This may be related to skin toxicity from pembrolizumab or Alimta.  She could also have an infection.  We prescribed doxycycline and she will return for repeat examination in 2 days.  She reports that she does not tolerate steroids well.  We will consider a trial of low-dose prednisone if her symptoms have not improved when she returns on 10/23/2019.  I have a low clinical suspicion for phlebitis.  She will elevate the legs and use Aleve.  I asked her to discontinue ketorolac.  [name], MD  10/21/2019  10:06 AM

## 2019-10-22 ENCOUNTER — Encounter: Payer: Self-pay | Admitting: Oncology

## 2019-10-23 ENCOUNTER — Inpatient Hospital Stay: Payer: Medicare Other | Admitting: Nurse Practitioner

## 2019-10-23 ENCOUNTER — Encounter: Payer: Self-pay | Admitting: Nurse Practitioner

## 2019-10-23 ENCOUNTER — Other Ambulatory Visit: Payer: Self-pay

## 2019-10-23 VITALS — BP 153/79 | HR 64 | Temp 98.2°F | Resp 17 | Ht 65.0 in | Wt 178.9 lb

## 2019-10-23 DIAGNOSIS — C3492 Malignant neoplasm of unspecified part of left bronchus or lung: Secondary | ICD-10-CM | POA: Diagnosis not present

## 2019-10-23 DIAGNOSIS — Z5112 Encounter for antineoplastic immunotherapy: Secondary | ICD-10-CM | POA: Diagnosis not present

## 2019-10-23 NOTE — Progress Notes (Addendum)
[place] OFFICE PROGRESS NOTE   Diagnosis: Non-small cell lung cancer  INTERVAL HISTORY:   [name] returns as scheduled.  She is seen today for reevaluation of recent onset edema and erythema of both ankles.  She was started on a course of doxycycline 10/21/2019. She notes improvement in the swelling, redness and pain. Symptoms tend to be better during the day and worsen at nighttime. She also notes improvement in the skin lesions on the forearms and lower legs. She has chronic numbness in the right foot, intermittent numbness left foot.  Objective:  Vital signs in last 24 hours:  Blood pressure (!) 153/79, pulse 64, temperature 98.2 F (36.8 C), temperature source Temporal, resp. rate 17, height '5\' 5"'  (1.651 m), weight 178 lb 14.4 oz (81.1 kg), SpO2 98 %.    GI: Abdomen soft and nontender. No hepatomegaly. Vascular: Trace edema at the lower legs/ankles bilaterally.  Skin: Several circular, dry appearing, mildly erythematous lesions at the forearms and lower legs, seem to be healing. Mild erythema and edema at the ankles with associated tenderness. Port-A-Cath without erythema.  Lab Results:  Lab Results  Component Value Date   WBC 4.3 10/09/2019   HGB 10.7 (L) 10/09/2019   HCT 32.5 (L) 10/09/2019   MCV 99.4 10/09/2019   PLT 232 10/09/2019   NEUTROABS 2.6 10/09/2019    Imaging:  No results found.  Medications: I have reviewed the patient's current medications.  Assessment/Plan: 1. Non-small cell lung cancer  MRI lumbar spine 04/29/2019-enlarging marrow lesions involving the L1 vertebral body, upper left sacrum and right iliac bone  MRI pelvis 04/29/2019-3.5 cm left iliac bone lesion appears slightly larger; other similar appearing lesions present within the left superior pubic ramus, left superior abdomen acetabulum and upper left sacrum  Kappa free light chains with mild elevation 05/12/2019  CTs10/07/2019-left lower lobe pulmonary mass 3.3  x 3.2 cm; lytic process left iliac bone; spinal lesions; 1.1 cm low-density left kidney lesion; right thyroid enlargement with heterogeneous appearance with potential for multiple discrete lesions  Biopsy left lower lobe lung mass 05/26/2019-poorly differentiated carcinoma; positive for cytokeratin 5/6, p63 and TTF-1, no EGFR, BRAF, ALK,ERBB2,ROS, orNTRKalteration  Cycle 1 carboplatin/Alimta/pembrolizumab 06/06/2019  Cycle 2 carboplatin/Alimta/pembrolizumab 06/27/2019  Cycle 3 carboplatin/Alimta/pembrolizumab 07/18/2019  Cycle 4 carboplatin/Alimta/pembrolizumab1/01/2020  CTs 08/27/2019-significant decrease in size of lobulated mass left lower lobe. Unchanged appearance of subtle bone lesions.  Cycle 5 Alimta/pembrolizumab 08/28/2019  Cycle 6 Alimta/pembrolizumab 09/18/2019  Cycle 7 Alimta/pembrolizumab 10/09/2019 2. Pain secondary to #1 3. Chronic back pain 4. Type 2 diabetes 5. Essential hypertension 6. CAD 7. Hyperlipidemia 8. Family history significant for multiple members with breast cancer 9. Grade 1 skin rash 07/18/2019 likely related to immunotherapy. Topical steroid cream as needed. 10. E. coli urinary tract infection 07/14/2019. Completed cephalexin. 11. Edema/swelling at the right greater than left ankle 10/21/2019-etiology unclear, potentially related to systemic therapy or an infection, doxycycline prescribed-improved 10/23/2019  Disposition: Ms. Kates appears stable.  The edema, erythema and tenderness at the ankles has improved since beginning doxycycline.  The skin lesions on the forearms and lower legs have also improved. She will complete the 7-day course of doxycycline.  She will return as scheduled on 10/30/2019.  She will contact the office in the interim with any problems.  Patient seen with Dr. Benay Spice.  Ned Card ANP/GNP-BC   10/23/2019  3:12 PM  This was a shared visit with Ned Card.  [name] was interviewed and examined.  The erythema and  tenderness at  the lower legs/ankles has improved.  She will continue doxycycline.  Julieanne Manson, MD

## 2019-10-26 ENCOUNTER — Other Ambulatory Visit: Payer: Self-pay | Admitting: Oncology

## 2019-10-30 ENCOUNTER — Inpatient Hospital Stay: Payer: Medicare Other | Attending: Oncology | Admitting: Oncology

## 2019-10-30 ENCOUNTER — Inpatient Hospital Stay: Payer: Medicare Other

## 2019-10-30 ENCOUNTER — Other Ambulatory Visit: Payer: Self-pay

## 2019-10-30 VITALS — BP 162/57 | HR 57 | Temp 97.8°F | Resp 18 | Ht 65.0 in | Wt 180.3 lb

## 2019-10-30 DIAGNOSIS — Z5112 Encounter for antineoplastic immunotherapy: Secondary | ICD-10-CM | POA: Insufficient documentation

## 2019-10-30 DIAGNOSIS — R609 Edema, unspecified: Secondary | ICD-10-CM | POA: Diagnosis not present

## 2019-10-30 DIAGNOSIS — Z79899 Other long term (current) drug therapy: Secondary | ICD-10-CM | POA: Insufficient documentation

## 2019-10-30 DIAGNOSIS — C3492 Malignant neoplasm of unspecified part of left bronchus or lung: Secondary | ICD-10-CM

## 2019-10-30 DIAGNOSIS — Z5111 Encounter for antineoplastic chemotherapy: Secondary | ICD-10-CM | POA: Diagnosis present

## 2019-10-30 DIAGNOSIS — Z95828 Presence of other vascular implants and grafts: Secondary | ICD-10-CM

## 2019-10-30 DIAGNOSIS — C3432 Malignant neoplasm of lower lobe, left bronchus or lung: Secondary | ICD-10-CM | POA: Insufficient documentation

## 2019-10-30 LAB — CMP (CANCER CENTER ONLY)
ALT: 18 U/L (ref 0–44)
AST: 26 U/L (ref 15–41)
Albumin: 3.8 g/dL (ref 3.5–5.0)
Alkaline Phosphatase: 92 U/L (ref 38–126)
Anion gap: 7 (ref 5–15)
BUN: 18 mg/dL (ref 8–23)
CO2: 25 mmol/L (ref 22–32)
Calcium: 10 mg/dL (ref 8.9–10.3)
Chloride: 106 mmol/L (ref 98–111)
Creatinine: 0.83 mg/dL (ref 0.44–1.00)
GFR, Est AFR Am: >60 mL/min (ref >60)
GFR, Estimated: >60 mL/min (ref >60)
Glucose, Bld: 103 mg/dL — ABNORMAL HIGH (ref 70–99)
Potassium: 4.8 mmol/L (ref 3.5–5.1)
Sodium: 138 mmol/L (ref 135–145)
Total Bilirubin: 0.3 mg/dL (ref 0.3–1.2)
Total Protein: 7.1 g/dL (ref 6.5–8.1)

## 2019-10-30 LAB — CBC WITH DIFFERENTIAL (CANCER CENTER ONLY)
Abs Immature Granulocytes: 0.01 10*3/uL (ref 0.00–0.07)
Basophils Absolute: 0 10*3/uL (ref 0.0–0.1)
Basophils Relative: 1 %
Eosinophils Absolute: 0.1 10*3/uL (ref 0.0–0.5)
Eosinophils Relative: 1 %
HCT: 33.9 % — ABNORMAL LOW (ref 36.0–46.0)
Hemoglobin: 10.9 g/dL — ABNORMAL LOW (ref 12.0–15.0)
Immature Granulocytes: 0 %
Lymphocytes Relative: 32 %
Lymphs Abs: 1.3 10*3/uL (ref 0.7–4.0)
MCH: 32.8 pg (ref 26.0–34.0)
MCHC: 32.2 g/dL (ref 30.0–36.0)
MCV: 102.1 fL — ABNORMAL HIGH (ref 80.0–100.0)
Monocytes Absolute: 0.5 10*3/uL (ref 0.1–1.0)
Monocytes Relative: 11 %
Neutro Abs: 2.3 10*3/uL (ref 1.7–7.7)
Neutrophils Relative %: 55 %
Platelet Count: 213 10*3/uL (ref 150–400)
RBC: 3.32 MIL/uL — ABNORMAL LOW (ref 3.87–5.11)
RDW: 14.5 % (ref 11.5–15.5)
WBC Count: 4.2 10*3/uL (ref 4.0–10.5)
nRBC: 0 % (ref 0.0–0.2)

## 2019-10-30 LAB — TSH: TSH: 0.625 u[IU]/mL (ref 0.308–3.960)

## 2019-10-30 MED ORDER — SODIUM CHLORIDE 0.9% FLUSH
10.0000 mL | INTRAVENOUS | Status: DC | PRN
  Administered 2019-10-30: 12:00:00 10 mL
  Filled 2019-10-30: qty 10

## 2019-10-30 MED ORDER — SODIUM CHLORIDE 0.9 % IV SOLN
529.0000 mg/m2 | Freq: Once | INTRAVENOUS | Status: AC
  Administered 2019-10-30: 1000 mg via INTRAVENOUS
  Filled 2019-10-30: qty 40

## 2019-10-30 MED ORDER — SODIUM CHLORIDE 0.9 % IV SOLN
200.0000 mg | Freq: Once | INTRAVENOUS | Status: AC
  Administered 2019-10-30: 200 mg via INTRAVENOUS
  Filled 2019-10-30: qty 8

## 2019-10-30 MED ORDER — HEPARIN SOD (PORK) LOCK FLUSH 100 UNIT/ML IV SOLN
500.0000 [IU] | Freq: Once | INTRAVENOUS | Status: AC | PRN
  Administered 2019-10-30: 500 [IU]
  Filled 2019-10-30: qty 5

## 2019-10-30 MED ORDER — SODIUM CHLORIDE 0.9 % IV SOLN
Freq: Once | INTRAVENOUS | Status: AC
  Filled 2019-10-30: qty 250

## 2019-10-30 MED ORDER — PROCHLORPERAZINE MALEATE 10 MG PO TABS
ORAL_TABLET | ORAL | Status: AC
  Filled 2019-10-30: qty 1

## 2019-10-30 MED ORDER — PROCHLORPERAZINE MALEATE 10 MG PO TABS
10.0000 mg | ORAL_TABLET | Freq: Once | ORAL | Status: AC
  Administered 2019-10-30: 10 mg via ORAL

## 2019-10-30 MED ORDER — HEPARIN SOD (PORK) LOCK FLUSH 100 UNIT/ML IV SOLN
500.0000 [IU] | Freq: Once | INTRAVENOUS | Status: DC
  Filled 2019-10-30: qty 5

## 2019-10-30 MED ORDER — SODIUM CHLORIDE 0.9% FLUSH
10.0000 mL | Freq: Once | INTRAVENOUS | Status: AC
  Administered 2019-10-30: 10 mL
  Filled 2019-10-30: qty 10

## 2019-10-30 NOTE — Progress Notes (Signed)
[place] OFFICE PROGRESS NOTE   Diagnosis: Non-small cell lung cancer  INTERVAL HISTORY:   [name] returns as scheduled.  She was able to take a trip to the beach last week.  She continues to have swelling at the lower legs and ankles.  She has pain and a burning sensation at the lower legs.  She continues to have groin pain and knee pain.  Objective:  Vital signs in last 24 hours:  Blood pressure (!) 162/57, pulse (!) 57, temperature 97.8 F (36.6 C), temperature source Temporal, resp. rate 18, height '5\' 5"'  (1.651 m), weight 180 lb 4.8 oz (81.8 kg), SpO2 97 %.    Resp: Lungs clear bilaterally Cardio: Regular rate and rhythm GI: No hepatosplenomegaly, no mass, nontender Vascular: Trace edema at the lower leg bilaterally, bilateral venous varicosities at the lower legs, no palpable cord, tender with mild erythema at the ankle bilaterally  Skin: Healing circular lesions at the forearm and pretibial area  Portacath/PICC-without erythema  Lab Results:  Lab Results  Component Value Date   WBC 4.2 10/30/2019   HGB 10.9 (L) 10/30/2019   HCT 33.9 (L) 10/30/2019   MCV 102.1 (H) 10/30/2019   PLT 213 10/30/2019   NEUTROABS 2.3 10/30/2019    CMP  Lab Results  Component Value Date   NA 138 10/30/2019   K 4.8 10/30/2019   CL 106 10/30/2019   CO2 25 10/30/2019   GLUCOSE 103 (H) 10/30/2019   BUN 18 10/30/2019   CREATININE 0.83 10/30/2019   CALCIUM 10.0 10/30/2019   PROT 7.1 10/30/2019   ALBUMIN 3.8 10/30/2019   AST 26 10/30/2019   ALT 18 10/30/2019   ALKPHOS 92 10/30/2019   BILITOT 0.3 10/30/2019   GFRNONAA >60 10/30/2019   GFRAA >60 10/30/2019     Medications: I have reviewed the patient's current medications.   Assessment/Plan:  1. Non-small cell lung cancer  MRI lumbar spine 04/29/2019-enlarging marrow lesions involving the L1 vertebral body, upper left sacrum and right iliac bone  MRI pelvis 04/29/2019-3.5 cm left iliac bone lesion  appears slightly larger; other similar appearing lesions present within the left superior pubic ramus, left superior abdomen acetabulum and upper left sacrum  Kappa free light chains with mild elevation 05/12/2019  CTs10/07/2019-left lower lobe pulmonary mass 3.3 x 3.2 cm; lytic process left iliac bone; spinal lesions; 1.1 cm low-density left kidney lesion; right thyroid enlargement with heterogeneous appearance with potential for multiple discrete lesions  Biopsy left lower lobe lung mass 05/26/2019-poorly differentiated carcinoma; positive for cytokeratin 5/6, p63 and TTF-1, no EGFR, BRAF, ALK,ERBB2,ROS, orNTRKalteration  Cycle 1 carboplatin/Alimta/pembrolizumab 06/06/2019  Cycle 2 carboplatin/Alimta/pembrolizumab 06/27/2019  Cycle 3 carboplatin/Alimta/pembrolizumab 07/18/2019  Cycle 4 carboplatin/Alimta/pembrolizumab1/01/2020  CTs 08/27/2019-significant decrease in size of lobulated mass left lower lobe. Unchanged appearance of subtle bone lesions.  Cycle 5 Alimta/pembrolizumab 08/28/2019  Cycle 6 Alimta/pembrolizumab 09/18/2019  Cycle 7 Alimta/pembrolizumab 10/09/2019  Cycle 8 Alimta/pembrolizumab 11/06/2019 2. Pain secondary to #1 3. Chronic back pain 4. Type 2 diabetes 5. Essential hypertension 6. CAD 7. Hyperlipidemia 8. Family history significant for multiple members with breast cancer 9. Grade 1 skin rash 07/18/2019 likely related to immunotherapy. Topical steroid cream as needed. 10. E. coli urinary tract infection 07/14/2019. Completed cephalexin. 11. Edema/swelling at the right greater than left ankle 10/21/2019-etiology unclear, potentially related to systemic therapy or an infection, doxycycline prescribed-improved 10/23/2019   Disposition: [name] appears stable.  There is no clinical evidence for progression of the lung cancer.  She will  continue treatment with Alimta/pembrolizumab.  The etiology of the mild leg edema/erythema is unclear.  This may be  related to venous from varicosities.  It is possible the erythema is related to Alimta or pembrolizumab.  She will call for worsening.  [name] will return for an office visit and chemotherapy in 3 weeks.  [name], MD  10/30/2019  10:16 AM

## 2019-10-31 ENCOUNTER — Telehealth: Payer: Self-pay | Admitting: Oncology

## 2019-10-31 NOTE — Telephone Encounter (Signed)
Scheduled per los. Called and spoke with patient. Confirmed appt 

## 2019-11-05 ENCOUNTER — Telehealth: Payer: Self-pay | Admitting: *Deleted

## 2019-11-05 NOTE — Telephone Encounter (Signed)
Calling to report the swelling and redness in her ankles and feet are not any better. Also area around the nickel sized sore on her leg is getting red. No fever. Wanted MD aware. Informed him that he will be notified in am when back in office.

## 2019-11-06 ENCOUNTER — Telehealth: Payer: Self-pay | Admitting: *Deleted

## 2019-11-06 NOTE — Telephone Encounter (Signed)
Per Dr. Benay Spice: Can see next week to assess legs. Called patient and provided appointment with NP for 11/10/19 at 12:15.

## 2019-11-07 ENCOUNTER — Other Ambulatory Visit: Payer: Self-pay | Admitting: Cardiovascular Disease

## 2019-11-10 ENCOUNTER — Encounter: Payer: Self-pay | Admitting: Nurse Practitioner

## 2019-11-10 ENCOUNTER — Inpatient Hospital Stay (HOSPITAL_BASED_OUTPATIENT_CLINIC_OR_DEPARTMENT_OTHER): Payer: Medicare Other | Admitting: Nurse Practitioner

## 2019-11-10 ENCOUNTER — Other Ambulatory Visit: Payer: Self-pay

## 2019-11-10 VITALS — BP 137/78 | HR 60 | Temp 98.5°F | Resp 18 | Ht 65.0 in | Wt 180.0 lb

## 2019-11-10 DIAGNOSIS — C3492 Malignant neoplasm of unspecified part of left bronchus or lung: Secondary | ICD-10-CM | POA: Diagnosis not present

## 2019-11-10 DIAGNOSIS — Z5112 Encounter for antineoplastic immunotherapy: Secondary | ICD-10-CM | POA: Diagnosis not present

## 2019-11-10 NOTE — Progress Notes (Addendum)
[place] OFFICE PROGRESS NOTE   Diagnosis: Non-small cell lung cancer  INTERVAL HISTORY:   [name] returns prior to scheduled follow-up for evaluation of persistent lower leg discomfort/edema.  She completed a cycle of Alimta/pembrolizumab.  She reports persistent burning and swelling at the lower legs and feet.  She feels her symptoms are worse.  She feels swollen "all over".  No nausea or vomiting.  No diarrhea.  No rash.  Objective:  Vital signs in last 24 hours:  Blood pressure 137/78, pulse 60, temperature 98.5 F (36.9 C), temperature source Temporal, resp. rate 18, height '5\' 5"'$  (1.651 m), weight 180 lb (81.6 kg), SpO2 97 %.    HEENT: No thrush or ulcers. GI: Abdomen soft and nontender.  No hepatomegaly. Vascular: Trace pitting edema, tenderness at the lower legs/ankles bilaterally.  Mild erythema at the lower legs bilaterally right greater than left. Skin: Right lower leg with a circular, dry/scabbed skin lesion. Port-A-Cath without erythema.  Lab Results:  Lab Results  Component Value Date   WBC 4.2 10/30/2019   HGB 10.9 (L) 10/30/2019   HCT 33.9 (L) 10/30/2019   MCV 102.1 (H) 10/30/2019   PLT 213 10/30/2019   NEUTROABS 2.3 10/30/2019    Imaging:  No results found.  Medications: I have reviewed the patient's current medications.  Assessment/Plan: 1. Non-small cell lung cancer  MRI lumbar spine 04/29/2019-enlarging marrow lesions involving the L1 vertebral body, upper left sacrum and right iliac bone  MRI pelvis 04/29/2019-3.5 cm left iliac bone lesion appears slightly larger; other similar appearing lesions present within the left superior pubic ramus, left superior abdomen acetabulum and upper left sacrum  Kappa free light chains with mild elevation 05/12/2019  CTs10/07/2019-left lower lobe pulmonary mass 3.3 x 3.2 cm; lytic process left iliac bone; spinal lesions; 1.1 cm low-density left kidney lesion; right thyroid enlargement  with heterogeneous appearance with potential for multiple discrete lesions  Biopsy left lower lobe lung mass 05/26/2019-poorly differentiated carcinoma; positive for cytokeratin 5/6, p63 and TTF-1, no EGFR, BRAF, ALK,ERBB2,ROS, orNTRKalteration  Cycle 1 carboplatin/Alimta/pembrolizumab 06/06/2019  Cycle 2 carboplatin/Alimta/pembrolizumab 06/27/2019  Cycle 3 carboplatin/Alimta/pembrolizumab 07/18/2019  Cycle 4 carboplatin/Alimta/pembrolizumab1/01/2020  CTs 08/27/2019-significant decrease in size of lobulated mass left lower lobe. Unchanged appearance of subtle bone lesions.  Cycle 5 Alimta/pembrolizumab 08/28/2019  Cycle 6 Alimta/pembrolizumab 09/18/2019  Cycle 7 Alimta/pembrolizumab 10/09/2019  Cycle 8 Alimta/pembrolizumab 10/30/2019 2. Pain secondary to #1 3. Chronic back pain 4. Type 2 diabetes 5. Essential hypertension 6. CAD 7. Hyperlipidemia 8. Family history significant for multiple members with breast cancer 9. Grade 1 skin rash 07/18/2019 likely related to immunotherapy. Topical steroid cream as needed. 10. E. coli urinary tract infection 07/14/2019. Completed cephalexin. 11. Edema/swelling at the right greater than left ankle 10/21/2019-etiology unclear, potentially related to systemic therapy or an infection, doxycycline prescribed-improved 10/23/2019  Disposition: [name] appears stable.  She completed cycle 8 of Alimta/pembrolizumab 10/30/2019.  She continues to have edema at the lower legs right greater than left and also notes a burning sensation.  We discussed the possibility of her symptoms being related to Alimta or pembrolizumab.  We discussed holding one of them at the time of her next treatment, likely the Alimta.  She agrees with this plan.  She will return as scheduled on 11/20/2019.  She will contact the office in the interim with any problems.  Patient seen with Dr. Benay Spice.  Ned Card ANP/GNP-BC   11/10/2019  12:34 PM This was a shared visit with  Ned Card.  [name] was interviewed and examined.  She continues to have mild edema at the lower legs/ankles with erythema at the right lower leg.  The etiology of these findings is unclear.  This may be related to pembrolizumab or pemetrexed.  We will hold pemetrexed with the next cycle of treatment.  Julieanne Manson, MD

## 2019-11-14 NOTE — Progress Notes (Signed)
Pharmacist Chemotherapy Monitoring - Follow Up Assessment    I verify that I have reviewed each item in the below checklist:  . Regimen for the patient is scheduled for the appropriate day and plan matches scheduled date. Marland Kitchen Appropriate non-routine labs are ordered dependent on drug ordered. . If applicable, additional medications reviewed and ordered per protocol based on lifetime cumulative doses and/or treatment regimen.   Plan for follow-up and/or issues identified: Yes . I-vent associated with next due treatment: Yes . MD and/or nursing notified: No  Acquanetta Belling 11/14/2019 1:15 PM

## 2019-11-19 ENCOUNTER — Other Ambulatory Visit: Payer: Self-pay | Admitting: Oncology

## 2019-11-20 ENCOUNTER — Inpatient Hospital Stay: Payer: Medicare Other | Admitting: Nurse Practitioner

## 2019-11-20 ENCOUNTER — Inpatient Hospital Stay: Payer: Medicare Other

## 2019-11-20 ENCOUNTER — Encounter: Payer: Self-pay | Admitting: Nurse Practitioner

## 2019-11-20 ENCOUNTER — Other Ambulatory Visit: Payer: Self-pay

## 2019-11-20 VITALS — BP 152/63 | HR 59 | Temp 97.8°F | Resp 18 | Ht 65.0 in | Wt 179.5 lb

## 2019-11-20 DIAGNOSIS — C3492 Malignant neoplasm of unspecified part of left bronchus or lung: Secondary | ICD-10-CM | POA: Diagnosis not present

## 2019-11-20 DIAGNOSIS — Z5112 Encounter for antineoplastic immunotherapy: Secondary | ICD-10-CM | POA: Diagnosis not present

## 2019-11-20 LAB — CBC WITH DIFFERENTIAL (CANCER CENTER ONLY)
Abs Immature Granulocytes: 0.01 10*3/uL (ref 0.00–0.07)
Basophils Absolute: 0 10*3/uL (ref 0.0–0.1)
Basophils Relative: 0 %
Eosinophils Absolute: 0.1 10*3/uL (ref 0.0–0.5)
Eosinophils Relative: 2 %
HCT: 34 % — ABNORMAL LOW (ref 36.0–46.0)
Hemoglobin: 11.1 g/dL — ABNORMAL LOW (ref 12.0–15.0)
Immature Granulocytes: 0 %
Lymphocytes Relative: 24 %
Lymphs Abs: 1.3 10*3/uL (ref 0.7–4.0)
MCH: 32.3 pg (ref 26.0–34.0)
MCHC: 32.6 g/dL (ref 30.0–36.0)
MCV: 98.8 fL (ref 80.0–100.0)
Monocytes Absolute: 0.5 10*3/uL (ref 0.1–1.0)
Monocytes Relative: 10 %
Neutro Abs: 3.4 10*3/uL (ref 1.7–7.7)
Neutrophils Relative %: 64 %
Platelet Count: 199 10*3/uL (ref 150–400)
RBC: 3.44 MIL/uL — ABNORMAL LOW (ref 3.87–5.11)
RDW: 14.1 % (ref 11.5–15.5)
WBC Count: 5.3 10*3/uL (ref 4.0–10.5)
nRBC: 0 % (ref 0.0–0.2)

## 2019-11-20 LAB — CMP (CANCER CENTER ONLY)
ALT: 23 U/L (ref 0–44)
AST: 29 U/L (ref 15–41)
Albumin: 3.9 g/dL (ref 3.5–5.0)
Alkaline Phosphatase: 87 U/L (ref 38–126)
Anion gap: 6 (ref 5–15)
BUN: 25 mg/dL — ABNORMAL HIGH (ref 8–23)
CO2: 25 mmol/L (ref 22–32)
Calcium: 9.7 mg/dL (ref 8.9–10.3)
Chloride: 106 mmol/L (ref 98–111)
Creatinine: 0.94 mg/dL (ref 0.44–1.00)
GFR, Est AFR Am: >60 mL/min (ref >60)
GFR, Estimated: 56 mL/min — ABNORMAL LOW (ref >60)
Glucose, Bld: 106 mg/dL — ABNORMAL HIGH (ref 70–99)
Potassium: 4.8 mmol/L (ref 3.5–5.1)
Sodium: 137 mmol/L (ref 135–145)
Total Bilirubin: 0.3 mg/dL (ref 0.3–1.2)
Total Protein: 7.2 g/dL (ref 6.5–8.1)

## 2019-11-20 MED ORDER — SODIUM CHLORIDE 0.9 % IV SOLN
500.0000 mg/m2 | Freq: Once | INTRAVENOUS | Status: AC
  Administered 2019-11-20: 900 mg via INTRAVENOUS
  Filled 2019-11-20: qty 20

## 2019-11-20 MED ORDER — CYANOCOBALAMIN 1000 MCG/ML IJ SOLN
1000.0000 ug | Freq: Once | INTRAMUSCULAR | Status: AC
  Administered 2019-11-20: 1000 ug via INTRAMUSCULAR

## 2019-11-20 MED ORDER — PROCHLORPERAZINE MALEATE 10 MG PO TABS
10.0000 mg | ORAL_TABLET | Freq: Once | ORAL | Status: AC
  Administered 2019-11-20: 10 mg via ORAL

## 2019-11-20 MED ORDER — SODIUM CHLORIDE 0.9% FLUSH
10.0000 mL | INTRAVENOUS | Status: DC | PRN
  Administered 2019-11-20: 10 mL
  Filled 2019-11-20: qty 10

## 2019-11-20 MED ORDER — SODIUM CHLORIDE 0.9 % IV SOLN
Freq: Once | INTRAVENOUS | Status: AC
  Filled 2019-11-20: qty 250

## 2019-11-20 MED ORDER — CYANOCOBALAMIN 1000 MCG/ML IJ SOLN
INTRAMUSCULAR | Status: AC
  Filled 2019-11-20: qty 1

## 2019-11-20 MED ORDER — PROCHLORPERAZINE MALEATE 10 MG PO TABS
ORAL_TABLET | ORAL | Status: AC
  Filled 2019-11-20: qty 1

## 2019-11-20 MED ORDER — SODIUM CHLORIDE 0.9 % IV SOLN
Freq: Once | INTRAVENOUS | Status: DC
  Filled 2019-11-20: qty 250

## 2019-11-20 MED ORDER — HEPARIN SOD (PORK) LOCK FLUSH 100 UNIT/ML IV SOLN
500.0000 [IU] | Freq: Once | INTRAVENOUS | Status: AC | PRN
  Administered 2019-11-20: 500 [IU]
  Filled 2019-11-20: qty 5

## 2019-11-20 MED ORDER — SODIUM CHLORIDE 0.9 % IV SOLN
200.0000 mg | Freq: Once | INTRAVENOUS | Status: AC
  Administered 2019-11-20: 200 mg via INTRAVENOUS
  Filled 2019-11-20: qty 8

## 2019-11-20 NOTE — Patient Instructions (Signed)
[facility] Discharge Instructions for Patients Receiving Chemotherapy  Today you received the following Immunotherapy agent: Pembrolizumab (Keytruda) and chemotherapy agent: Pemetrexed (Alimta)  To help prevent nausea and vomiting after your treatment, we encourage you to take your nausea medicationas directed by your MD.   If you develop nausea and vomiting that is not controlled by your nausea medication, call the clinic.   BELOW ARE SYMPTOMS THAT SHOULD BE REPORTED IMMEDIATELY:  *FEVER GREATER THAN 100.5 F  *CHILLS WITH OR WITHOUT FEVER  NAUSEA AND VOMITING THAT IS NOT CONTROLLED WITH YOUR NAUSEA MEDICATION  *UNUSUAL SHORTNESS OF BREATH  *UNUSUAL BRUISING OR BLEEDING  TENDERNESS IN MOUTH AND THROAT WITH OR WITHOUT PRESENCE OF ULCERS  *URINARY PROBLEMS  *BOWEL PROBLEMS  UNUSUAL RASH Items with * indicate a potential emergency and should be followed up as soon as possible.  Feel free to call the clinic should you have any questions or concerns. The clinic phone number is [phone].  Please show the [facility] at check-in to the Emergency Department and triage nurse.  Cyanocobalamin, Vitamin B12 injection What is this medicine? CYANOCOBALAMIN (sye an oh koe BAL a min) is a man made form of vitamin B12. Vitamin B12 is used in the growth of healthy blood cells, nerve cells, and proteins in the body. It also helps with the metabolism of fats and carbohydrates. This medicine is used to treat people who can not absorb vitamin B12. This medicine may be used for other purposes; ask your health care provider or pharmacist if you have questions. COMMON BRAND NAME(S): B-12 Compliance Kit, B-12 Injection Kit, Cyomin, LA-12, Nutri-Twelve, Physicians EZ Use B-12, Primabalt What should I tell my health care provider before I take this medicine? They need to know if you have any of these conditions:  kidney disease  Leber's disease  megaloblastic anemia  an  unusual or allergic reaction to cyanocobalamin, cobalt, other medicines, foods, dyes, or preservatives  pregnant or trying to get pregnant  breast-feeding How should I use this medicine? This medicine is injected into a muscle or deeply under the skin. It is usually given by a health care professional in a clinic or doctor's office. However, your doctor may teach you how to inject yourself. Follow all instructions. Talk to your pediatrician regarding the use of this medicine in children. Special care may be needed. Overdosage: If you think you have taken too much of this medicine contact a poison control center or emergency room at once. NOTE: This medicine is only for you. Do not share this medicine with others. What if I miss a dose? If you are given your dose at a clinic or doctor's office, call to reschedule your appointment. If you give your own injections and you miss a dose, take it as soon as you can. If it is almost time for your next dose, take only that dose. Do not take double or extra doses. What may interact with this medicine?  colchicine  heavy alcohol intake This list may not describe all possible interactions. Give your health care provider a list of all the medicines, herbs, non-prescription drugs, or dietary supplements you use. Also tell them if you smoke, drink alcohol, or use illegal drugs. Some items may interact with your medicine. What should I watch for while using this medicine? Visit your doctor or health care professional regularly. You may need blood work done while you are taking this medicine. You may need to follow a special diet. Talk  to your doctor. Limit your alcohol intake and avoid smoking to get the best benefit. What side effects may I notice from receiving this medicine? Side effects that you should report to your doctor or health care professional as soon as possible:  allergic reactions like skin rash, itching or hives, swelling of the face, lips,  or tongue  blue tint to skin  chest tightness, pain  difficulty breathing, wheezing  dizziness  red, swollen painful area on the leg Side effects that usually do not require medical attention (report to your doctor or health care professional if they continue or are bothersome):  diarrhea  headache This list may not describe all possible side effects. Call your doctor for medical advice about side effects. You may report side effects to FDA at 1-800-FDA-1088. Where should I keep my medicine? Keep out of the reach of children. Store at room temperature between 15 and 30 degrees C (59 and 85 degrees F). Protect from light. Throw away any unused medicine after the expiration date. NOTE: This sheet is a summary. It may not cover all possible information. If you have questions about this medicine, talk to your doctor, pharmacist, or health care provider.  2020 Elsevier/Gold Standard (2007-10-28 22:10:20) Coronavirus (COVID-19) Are you at risk?  Are you at risk for the Coronavirus (COVID-19)?  To be considered HIGH RISK for Coronavirus (COVID-19), you have to meet the following criteria:  . Traveled to [place], [place], [place], [place] or [place]; or in the [place] to [place], [place], [place], or [place]; and have fever, cough, and shortness of breath within the last 2 weeks of travel OR . Been in close contact with a person diagnosed with COVID-19 within the last 2 weeks and have fever, cough, and shortness of breath . IF YOU DO NOT MEET THESE CRITERIA, YOU ARE CONSIDERED LOW RISK FOR COVID-19.  What to do if you are HIGH RISK for COVID-19?  Marland Kitchen If you are having a medical emergency, call 911. . Seek medical care right away. Before you go to a doctor's office, urgent care or emergency department, call ahead and tell them about your recent travel, contact with someone diagnosed with COVID-19, and your symptoms. You should receive instructions from your physician's office  regarding next steps of care.  . When you arrive at healthcare provider, tell the healthcare staff immediately you have returned from visiting [place], [place], [place], [place] or [place]; or traveled in the [place] to [place], [place], [place], or [place]; in the last two weeks or you have been in close contact with a person diagnosed with COVID-19 in the last 2 weeks.   . Tell the health care staff about your symptoms: fever, cough and shortness of breath. . After you have been seen by a medical provider, you will be either: o Tested for (COVID-19) and discharged home on quarantine except to seek medical care if symptoms worsen, and asked to  - Stay home and avoid contact with others until you get your results (4-5 days)  - Avoid travel on public transportation if possible (such as bus, train, or airplane) or o Sent to the Emergency Department by EMS for evaluation, COVID-19 testing, and possible admission depending on your condition and test results.  What to do if you are LOW RISK for COVID-19?  Reduce your risk of any infection by using the same precautions used for avoiding the common cold or flu:  Marland Kitchen Wash your hands often with  soap and warm water for at least 20 seconds.  If soap and water are not readily available, use an alcohol-based hand sanitizer with at least 60% alcohol.  . If coughing or sneezing, cover your mouth and nose by coughing or sneezing into the elbow areas of your shirt or coat, into a tissue or into your sleeve (not your hands). . Avoid shaking hands with others and consider head nods or verbal greetings only. . Avoid touching your eyes, nose, or mouth with unwashed hands.  . Avoid close contact with people who are sick. . Avoid places or events with large numbers of people in one location, like concerts or sporting events. . Carefully consider travel plans you have or are making. . If you are planning any travel outside or inside the [place], visit the CDC's  Travelers' Health webpage for the latest health notices. . If you have some symptoms but not all symptoms, continue to monitor at home and seek medical attention if your symptoms worsen. . If you are having a medical emergency, call 911.   [facility] / e-Visit: [email or web address]         [facility]: [facility]: [phone]                   [facility]: [phone]

## 2019-11-20 NOTE — Progress Notes (Addendum)
[facility] OFFICE PROGRESS NOTE   Diagnosis: Non-small cell lung cancer  INTERVAL HISTORY:   [name] returns as scheduled.  She completed cycle 8 Alimta/pembrolizumab 10/30/2019.  She denies nausea/vomiting.  No mouth sores.  No diarrhea.  No rash.  Stable dyspnea on exertion.  No fever or cough.  Leg symptoms including edema, pain and redness have improved.  Objective:  Vital signs in last 24 hours:  Blood pressure (!) 152/63, pulse (!) 59, temperature 97.8 F (36.6 C), temperature source Temporal, resp. rate 18, height '5\' 5"'  (1.651 m), weight 179 lb 8 oz (81.4 kg), SpO2 100 %.    HEENT: No thrush or ulcers. Resp: Lungs clear bilaterally. Cardio: Regular rate and rhythm. GI: Abdomen soft and nontender.  No hepatomegaly. Vascular: Trace edema at the lower legs/ankles, no significant tenderness. Skin: Skin discoloration right lower leg consistent with chronic stasis change.  Right lower leg with a circular dry, scabbed skin lesion. Port-A-Cath without erythema.  Lab Results:  Lab Results  Component Value Date   WBC 5.3 11/20/2019   HGB 11.1 (L) 11/20/2019   HCT 34.0 (L) 11/20/2019   MCV 98.8 11/20/2019   PLT 199 11/20/2019   NEUTROABS 3.4 11/20/2019    Imaging:  No results found.  Medications: I have reviewed the patient's current medications.  Assessment/Plan: 1. Non-small cell lung cancer  MRI lumbar spine 04/29/2019-enlarging marrow lesions involving the L1 vertebral body, upper left sacrum and right iliac bone  MRI pelvis 04/29/2019-3.5 cm left iliac bone lesion appears slightly larger; other similar appearing lesions present within the left superior pubic ramus, left superior abdomen acetabulum and upper left sacrum  Kappa free light chains with mild elevation 05/12/2019  CTs10/07/2019-left lower lobe pulmonary mass 3.3 x 3.2 cm; lytic process left iliac bone; spinal lesions; 1.1 cm low-density left kidney lesion; right thyroid  enlargement with heterogeneous appearance with potential for multiple discrete lesions  Biopsy left lower lobe lung mass 05/26/2019-poorly differentiated carcinoma; positive for cytokeratin 5/6, p63 and TTF-1, no EGFR, BRAF, ALK,ERBB2,ROS, orNTRKalteration  Cycle 1 carboplatin/Alimta/pembrolizumab 06/06/2019  Cycle 2 carboplatin/Alimta/pembrolizumab 06/27/2019  Cycle 3 carboplatin/Alimta/pembrolizumab 07/18/2019  Cycle 4 carboplatin/Alimta/pembrolizumab1/01/2020  CTs 08/27/2019-significant decrease in size of lobulated mass left lower lobe. Unchanged appearance of subtle bone lesions.  Cycle 5 Alimta/pembrolizumab 08/28/2019  Cycle 6 Alimta/pembrolizumab 09/18/2019  Cycle 7 Alimta/pembrolizumab 10/09/2019  Cycle 8 Alimta/pembrolizumab 10/30/2019  Cycle 9 Alimta/pembrolizumab 11/20/2019 2. Pain secondary to #1 3. Chronic back pain 4. Type 2 diabetes 5. Essential hypertension 6. CAD 7. Hyperlipidemia 8. Family history significant for multiple members with breast cancer 9. Grade 1 skin rash 07/18/2019 likely related to immunotherapy. Topical steroid cream as needed. 10. E. coli urinary tract infection 07/14/2019. Completed cephalexin. 11. Edema/tenderness at the right greater than left ankle 10/21/2019-etiology unclear, potentially related to systemic therapy or an infection, doxycycline prescribed-improved 10/23/2019; marked improvement 11/20/2019  Disposition: [name] appears stable.  She has completed 8 cycles of Alimta/pembrolizumab.  Plan to proceed with cycle 9 today as scheduled.  We had originally planned to hold Alimta with today's treatment due to edema, tenderness, erythema at the lower legs.  Symptoms have improved considerably.  She would like to proceed with Alimta and pembrolizumab today.  She will contact the office if symptoms recur after today's treatment.  She will undergo restaging CTs prior to her next visit in 3 weeks.  We reviewed the CBC and chemistry panel  from today.  Labs adequate to proceed with treatment.  She will return  for lab, follow-up, Alimta/pembrolizumab 12/11/2019.  She will contact the office in the interim as outlined above or if any other problems.  Patient seen with Dr. Benay Spice.    Ned Card ANP/GNP-BC   11/20/2019  10:28 AM This was a shared visit with Ned Card.  [name] was interviewed and examined.  The leg erythema and tenderness has improved.  She would like to continue both Alimta and pembrolizumab.  She will receive another cycle today.  Julieanne Manson, MD

## 2019-11-20 NOTE — Progress Notes (Signed)
Per Lattie Haw, pemetrexed will be continued today at previous dose basis of 500 mg/m2. Patient's symptoms are significantly better today.   Demetrius Charity, PharmD, BCPS, [facility] Phone: [phone] 11/20/2019

## 2019-11-21 ENCOUNTER — Telehealth: Payer: Self-pay | Admitting: Oncology

## 2019-11-21 NOTE — Telephone Encounter (Signed)
Scheduled apt per 4/22 los - pt aware of appt date and times - central radiology to contact pt about ct scan

## 2019-11-28 ENCOUNTER — Encounter: Payer: Self-pay | Admitting: Oncology

## 2019-12-05 NOTE — Progress Notes (Signed)

## 2019-12-07 ENCOUNTER — Other Ambulatory Visit: Payer: Self-pay | Admitting: Oncology

## 2019-12-08 ENCOUNTER — Encounter: Payer: Self-pay | Admitting: Oncology

## 2019-12-09 ENCOUNTER — Other Ambulatory Visit: Payer: Self-pay

## 2019-12-09 ENCOUNTER — Encounter (HOSPITAL_COMMUNITY): Payer: Self-pay

## 2019-12-09 ENCOUNTER — Other Ambulatory Visit (HOSPITAL_COMMUNITY): Payer: Medicare Other

## 2019-12-09 ENCOUNTER — Ambulatory Visit (HOSPITAL_COMMUNITY)
Admission: RE | Admit: 2019-12-09 | Discharge: 2019-12-09 | Disposition: A | Discharge status: Home / Self Care | Payer: Medicare Other | Source: Ambulatory Visit | Attending: Nurse Practitioner | Admitting: Nurse Practitioner

## 2019-12-09 DIAGNOSIS — C3492 Malignant neoplasm of unspecified part of left bronchus or lung: Secondary | ICD-10-CM | POA: Insufficient documentation

## 2019-12-09 MED ORDER — IOHEXOL 300 MG/ML  SOLN
100.0000 mL | Freq: Once | INTRAMUSCULAR | Status: AC | PRN
  Administered 2019-12-09: 100 mL via INTRAVENOUS

## 2019-12-09 MED ORDER — SODIUM CHLORIDE (PF) 0.9 % IJ SOLN
INTRAMUSCULAR | Status: AC
  Filled 2019-12-09: qty 50

## 2019-12-09 MED ORDER — HEPARIN SOD (PORK) LOCK FLUSH 100 UNIT/ML IV SOLN
500.0000 [IU] | Freq: Once | INTRAVENOUS | Status: AC
  Administered 2019-12-09: 500 [IU] via INTRAVENOUS

## 2019-12-09 MED ORDER — HEPARIN SOD (PORK) LOCK FLUSH 100 UNIT/ML IV SOLN
INTRAVENOUS | Status: AC
  Filled 2019-12-09: qty 5

## 2019-12-11 ENCOUNTER — Other Ambulatory Visit: Payer: Self-pay

## 2019-12-11 ENCOUNTER — Inpatient Hospital Stay: Payer: Medicare Other | Attending: Oncology

## 2019-12-11 ENCOUNTER — Inpatient Hospital Stay (HOSPITAL_BASED_OUTPATIENT_CLINIC_OR_DEPARTMENT_OTHER): Payer: Medicare Other | Admitting: Oncology

## 2019-12-11 ENCOUNTER — Inpatient Hospital Stay: Payer: Medicare Other

## 2019-12-11 VITALS — BP 142/67 | HR 58 | Temp 98.3°F | Resp 17 | Ht 65.0 in | Wt 181.9 lb

## 2019-12-11 DIAGNOSIS — C3432 Malignant neoplasm of lower lobe, left bronchus or lung: Secondary | ICD-10-CM | POA: Diagnosis present

## 2019-12-11 DIAGNOSIS — Z5111 Encounter for antineoplastic chemotherapy: Secondary | ICD-10-CM | POA: Diagnosis present

## 2019-12-11 DIAGNOSIS — C3492 Malignant neoplasm of unspecified part of left bronchus or lung: Secondary | ICD-10-CM

## 2019-12-11 DIAGNOSIS — Z95828 Presence of other vascular implants and grafts: Secondary | ICD-10-CM

## 2019-12-11 DIAGNOSIS — C7951 Secondary malignant neoplasm of bone: Secondary | ICD-10-CM | POA: Insufficient documentation

## 2019-12-11 DIAGNOSIS — Z5112 Encounter for antineoplastic immunotherapy: Secondary | ICD-10-CM | POA: Diagnosis present

## 2019-12-11 DIAGNOSIS — Z79899 Other long term (current) drug therapy: Secondary | ICD-10-CM | POA: Diagnosis not present

## 2019-12-11 LAB — CBC WITH DIFFERENTIAL (CANCER CENTER ONLY)
Abs Immature Granulocytes: 0.01 10*3/uL (ref 0.00–0.07)
Basophils Absolute: 0 10*3/uL (ref 0.0–0.1)
Basophils Relative: 0 %
Eosinophils Absolute: 0 10*3/uL (ref 0.0–0.5)
Eosinophils Relative: 1 %
HCT: 35.7 % — ABNORMAL LOW (ref 36.0–46.0)
Hemoglobin: 11.3 g/dL — ABNORMAL LOW (ref 12.0–15.0)
Immature Granulocytes: 0 %
Lymphocytes Relative: 20 %
Lymphs Abs: 1 10*3/uL (ref 0.7–4.0)
MCH: 31.6 pg (ref 26.0–34.0)
MCHC: 31.7 g/dL (ref 30.0–36.0)
MCV: 99.7 fL (ref 80.0–100.0)
Monocytes Absolute: 0.6 10*3/uL (ref 0.1–1.0)
Monocytes Relative: 11 %
Neutro Abs: 3.2 10*3/uL (ref 1.7–7.7)
Neutrophils Relative %: 68 %
Platelet Count: 210 10*3/uL (ref 150–400)
RBC: 3.58 MIL/uL — ABNORMAL LOW (ref 3.87–5.11)
RDW: 14.2 % (ref 11.5–15.5)
WBC Count: 4.8 10*3/uL (ref 4.0–10.5)
nRBC: 0 % (ref 0.0–0.2)

## 2019-12-11 LAB — TSH: TSH: 1.059 u[IU]/mL (ref 0.308–3.960)

## 2019-12-11 LAB — CMP (CANCER CENTER ONLY)
ALT: 26 U/L (ref 0–44)
AST: 31 U/L (ref 15–41)
Albumin: 4 g/dL (ref 3.5–5.0)
Alkaline Phosphatase: 90 U/L (ref 38–126)
Anion gap: 9 (ref 5–15)
BUN: 17 mg/dL (ref 8–23)
CO2: 24 mmol/L (ref 22–32)
Calcium: 9.8 mg/dL (ref 8.9–10.3)
Chloride: 106 mmol/L (ref 98–111)
Creatinine: 0.86 mg/dL (ref 0.44–1.00)
GFR, Est AFR Am: >60 mL/min (ref >60)
GFR, Estimated: >60 mL/min (ref >60)
Glucose, Bld: 96 mg/dL (ref 70–99)
Potassium: 4.8 mmol/L (ref 3.5–5.1)
Sodium: 139 mmol/L (ref 135–145)
Total Bilirubin: 0.3 mg/dL (ref 0.3–1.2)
Total Protein: 7.6 g/dL (ref 6.5–8.1)

## 2019-12-11 MED ORDER — PROCHLORPERAZINE MALEATE 10 MG PO TABS
ORAL_TABLET | ORAL | Status: AC
  Filled 2019-12-11: qty 1

## 2019-12-11 MED ORDER — HEPARIN SOD (PORK) LOCK FLUSH 100 UNIT/ML IV SOLN
500.0000 [IU] | Freq: Once | INTRAVENOUS | Status: AC | PRN
  Administered 2019-12-11: 500 [IU]
  Filled 2019-12-11: qty 5

## 2019-12-11 MED ORDER — SODIUM CHLORIDE 0.9% FLUSH
10.0000 mL | INTRAVENOUS | Status: DC | PRN
  Administered 2019-12-11: 10 mL
  Filled 2019-12-11: qty 10

## 2019-12-11 MED ORDER — SODIUM CHLORIDE 0.9% FLUSH
10.0000 mL | Freq: Once | INTRAVENOUS | Status: AC
  Administered 2019-12-11: 10 mL
  Filled 2019-12-11: qty 10

## 2019-12-11 MED ORDER — SODIUM CHLORIDE 0.9 % IV SOLN
Freq: Once | INTRAVENOUS | Status: AC
  Filled 2019-12-11: qty 250

## 2019-12-11 MED ORDER — SODIUM CHLORIDE 0.9 % IV SOLN
500.0000 mg/m2 | Freq: Once | INTRAVENOUS | Status: AC
  Administered 2019-12-11: 900 mg via INTRAVENOUS
  Filled 2019-12-11: qty 16

## 2019-12-11 MED ORDER — PROCHLORPERAZINE MALEATE 10 MG PO TABS
10.0000 mg | ORAL_TABLET | Freq: Once | ORAL | Status: AC
  Administered 2019-12-11: 10 mg via ORAL

## 2019-12-11 MED ORDER — SODIUM CHLORIDE 0.9 % IV SOLN
200.0000 mg | Freq: Once | INTRAVENOUS | Status: AC
  Administered 2019-12-11: 200 mg via INTRAVENOUS
  Filled 2019-12-11: qty 8

## 2019-12-11 NOTE — Patient Instructions (Signed)
Please provide copy of your Medical Advanced Directive to have scanned into medical record.

## 2019-12-11 NOTE — Progress Notes (Signed)
[facility] OFFICE PROGRESS NOTE   Diagnosis: Non-small cell lung cancer  INTERVAL HISTORY:   [name] completed another cycle of Alimta and pembrolizumab on 11/20/2019.  She has increased lower leg edema following chemotherapy that improves prior to the next cycle.  She has diffuse arthritis pain.  Good appetite.  Objective:  Vital signs in last 24 hours:  Blood pressure (!) 142/67, pulse (!) 58, temperature 98.3 F (36.8 C), temperature source Temporal, resp. rate 17, height '5\' 5"'  (1.651 m), weight 181 lb 14.4 oz (82.5 kg), SpO2 98 %.    Resp: Lungs clear bilaterally Cardio: Regular rate and rhythm GI: No hepatomegaly, nontender Vascular: Trace pitting edema at the foot and ankle bilaterally, mild brown/tan discoloration at the lower leg bilaterally with venous varicosities    Portacath/PICC-without erythema  Lab Results:  Lab Results  Component Value Date   WBC 4.8 12/11/2019   HGB 11.3 (L) 12/11/2019   HCT 35.7 (L) 12/11/2019   MCV 99.7 12/11/2019   PLT 210 12/11/2019   NEUTROABS 3.2 12/11/2019    CMP  Lab Results  Component Value Date   NA 139 12/11/2019   K 4.8 12/11/2019   CL 106 12/11/2019   CO2 24 12/11/2019   GLUCOSE 96 12/11/2019   BUN 17 12/11/2019   CREATININE 0.86 12/11/2019   CALCIUM 9.8 12/11/2019   PROT 7.6 12/11/2019   ALBUMIN 4.0 12/11/2019   AST 31 12/11/2019   ALT 26 12/11/2019   ALKPHOS 90 12/11/2019   BILITOT 0.3 12/11/2019   GFRNONAA >60 12/11/2019   GFRAA >60 12/11/2019    No results found for: CEA1  Lab Results  Component Value Date   INR 1.0 06/04/2019    Imaging:  CT Chest W Contrast  Result Date: 12/09/2019 CLINICAL DATA:  Stage IV left lower lobe non-small cell lung cancer on chemoimmunotherapy. Restaging. EXAM: CT CHEST, ABDOMEN, AND PELVIS WITH CONTRAST TECHNIQUE: Multidetector CT imaging of the chest, abdomen and pelvis was performed following the standard protocol during bolus administration of  intravenous contrast. CONTRAST:  143m OMNIPAQUE IOHEXOL 300 MG/ML  SOLN COMPARISON:  08/27/2019 CT chest, abdomen and pelvis. 05/19/2019 PET-CT. FINDINGS: CT CHEST FINDINGS Cardiovascular: Top-normal heart size. No significant pericardial effusion/thickening. Right internal jugular Port-A-Cath terminates at the cavoatrial junction. Three-vessel coronary atherosclerosis. Atherosclerotic nonaneurysmal thoracic aorta. Stable dilated main pulmonary artery (3.7 cm diameter). No central pulmonary emboli. Mediastinum/Nodes: Stable bilateral hypodense thyroid nodules, largest 1.1 cm on the right. Not clinically significant; no follow-up imaging recommended (ref: J Am Coll Radiol. 2015 Feb;12(2): 143-50). Unremarkable esophagus. No pathologically enlarged axillary, mediastinal or hilar lymph nodes. Lungs/Pleura: No pneumothorax. No pleural effusion. Mild centrilobular and paraseptal emphysema. Irregular solid posterior left lower lobe 1.8 x 1.0 cm pulmonary nodule (series 6/image 95), previously 2.1 x 1.1 cm using similar measurement technique, slightly decreased. No acute consolidative airspace disease or new significant pulmonary nodules. Musculoskeletal: No aggressive appearing focal osseous lesions. Mild thoracic spondylosis. Partially visualized surgical hardware from ACDF. CT ABDOMEN PELVIS FINDINGS Hepatobiliary: Normal liver with no liver mass. Normal gallbladder with no radiopaque cholelithiasis. No biliary ductal dilatation. Pancreas: Normal, with no mass or duct dilation. Spleen: Normal size. No mass. Adrenals/Urinary Tract: Normal adrenals. Subcentimeter hypodense renal cortical lesions in the left kidney are too small to characterize and unchanged. No right renal lesions. No hydronephrosis. Normal bladder. Stomach/Bowel: Normal non-distended stomach. Normal caliber small bowel with no small bowel wall thickening. Appendectomy. Oral contrast transits to the colon. Normal large bowel with no  diverticulosis, large  bowel wall thickening or pericolonic fat stranding. Vascular/Lymphatic: Atherosclerotic nonaneurysmal abdominal aorta. Patent portal, splenic, hepatic and renal veins. No pathologically enlarged lymph nodes in the abdomen or pelvis. Reproductive: Status post hysterectomy, with no abnormal findings at the vaginal cuff. No adnexal mass. Other: No pneumoperitoneum, ascites or focal fluid collection. Musculoskeletal: Small faintly sclerotic superior L1 vertebral lesion is stable. Mild lumbar spondylosis. Small mixed lytic and sclerotic superior left sacral (series 2/image 93) and superior left pubic ramus (series 2/image 117) lesions are stable. No new focal osseous lesions. IMPRESSION: 1. No new or progressive metastatic disease in the chest, abdomen or pelvis. 2. Irregular solid posterior left lower lobe pulmonary nodule is slightly decreased. 3. Stable small subtle L1 vertebral, left sacral and superior left pubic ramus osseous metastases, better seen on prior PET-CT and MRI studies. 4. Chronic findings include: Stable dilated main pulmonary artery, suggesting chronic pulmonary arterial hypertension. Aortic Atherosclerosis (ICD10-I70.0) and Emphysema (ICD10-J43.9). Electronically Signed   By: Ilona Sorrel M.D.   On: 12/09/2019 14:24   CT Abdomen Pelvis W Contrast  Result Date: 12/09/2019 CLINICAL DATA:  Stage IV left lower lobe non-small cell lung cancer on chemoimmunotherapy. Restaging. EXAM: CT CHEST, ABDOMEN, AND PELVIS WITH CONTRAST TECHNIQUE: Multidetector CT imaging of the chest, abdomen and pelvis was performed following the standard protocol during bolus administration of intravenous contrast. CONTRAST:  157m OMNIPAQUE IOHEXOL 300 MG/ML  SOLN COMPARISON:  08/27/2019 CT chest, abdomen and pelvis. 05/19/2019 PET-CT. FINDINGS: CT CHEST FINDINGS Cardiovascular: Top-normal heart size. No significant pericardial effusion/thickening. Right internal jugular Port-A-Cath terminates at the cavoatrial junction.  Three-vessel coronary atherosclerosis. Atherosclerotic nonaneurysmal thoracic aorta. Stable dilated main pulmonary artery (3.7 cm diameter). No central pulmonary emboli. Mediastinum/Nodes: Stable bilateral hypodense thyroid nodules, largest 1.1 cm on the right. Not clinically significant; no follow-up imaging recommended (ref: J Am Coll Radiol. 2015 Feb;12(2): 143-50). Unremarkable esophagus. No pathologically enlarged axillary, mediastinal or hilar lymph nodes. Lungs/Pleura: No pneumothorax. No pleural effusion. Mild centrilobular and paraseptal emphysema. Irregular solid posterior left lower lobe 1.8 x 1.0 cm pulmonary nodule (series 6/image 95), previously 2.1 x 1.1 cm using similar measurement technique, slightly decreased. No acute consolidative airspace disease or new significant pulmonary nodules. Musculoskeletal: No aggressive appearing focal osseous lesions. Mild thoracic spondylosis. Partially visualized surgical hardware from ACDF. CT ABDOMEN PELVIS FINDINGS Hepatobiliary: Normal liver with no liver mass. Normal gallbladder with no radiopaque cholelithiasis. No biliary ductal dilatation. Pancreas: Normal, with no mass or duct dilation. Spleen: Normal size. No mass. Adrenals/Urinary Tract: Normal adrenals. Subcentimeter hypodense renal cortical lesions in the left kidney are too small to characterize and unchanged. No right renal lesions. No hydronephrosis. Normal bladder. Stomach/Bowel: Normal non-distended stomach. Normal caliber small bowel with no small bowel wall thickening. Appendectomy. Oral contrast transits to the colon. Normal large bowel with no diverticulosis, large bowel wall thickening or pericolonic fat stranding. Vascular/Lymphatic: Atherosclerotic nonaneurysmal abdominal aorta. Patent portal, splenic, hepatic and renal veins. No pathologically enlarged lymph nodes in the abdomen or pelvis. Reproductive: Status post hysterectomy, with no abnormal findings at the vaginal cuff. No adnexal  mass. Other: No pneumoperitoneum, ascites or focal fluid collection. Musculoskeletal: Small faintly sclerotic superior L1 vertebral lesion is stable. Mild lumbar spondylosis. Small mixed lytic and sclerotic superior left sacral (series 2/image 93) and superior left pubic ramus (series 2/image 117) lesions are stable. No new focal osseous lesions. IMPRESSION: 1. No new or progressive metastatic disease in the chest, abdomen or pelvis. 2. Irregular solid posterior left lower lobe pulmonary  nodule is slightly decreased. 3. Stable small subtle L1 vertebral, left sacral and superior left pubic ramus osseous metastases, better seen on prior PET-CT and MRI studies. 4. Chronic findings include: Stable dilated main pulmonary artery, suggesting chronic pulmonary arterial hypertension. Aortic Atherosclerosis (ICD10-I70.0) and Emphysema (ICD10-J43.9). Electronically Signed   By: Ilona Sorrel M.D.   On: 12/09/2019 14:24    Medications: I have reviewed the patient's current medications.   Assessment/Plan: 1. Non-small cell lung cancer  MRI lumbar spine 04/29/2019-enlarging marrow lesions involving the L1 vertebral body, upper left sacrum and right iliac bone  MRI pelvis 04/29/2019-3.5 cm left iliac bone lesion appears slightly larger; other similar appearing lesions present within the left superior pubic ramus, left superior abdomen acetabulum and upper left sacrum  Kappa free light chains with mild elevation 05/12/2019  CTs10/07/2019-left lower lobe pulmonary mass 3.3 x 3.2 cm; lytic process left iliac bone; spinal lesions; 1.1 cm low-density left kidney lesion; right thyroid enlargement with heterogeneous appearance with potential for multiple discrete lesions  Biopsy left lower lobe lung mass 05/26/2019-poorly differentiated carcinoma; positive for cytokeratin 5/6, p63 and TTF-1, no EGFR, BRAF, ALK,ERBB2,ROS, orNTRKalteration  Cycle 1 carboplatin/Alimta/pembrolizumab 06/06/2019  Cycle 2  carboplatin/Alimta/pembrolizumab 06/27/2019  Cycle 3 carboplatin/Alimta/pembrolizumab 07/18/2019  Cycle 4 carboplatin/Alimta/pembrolizumab1/01/2020  CTs 08/27/2019-significant decrease in size of lobulated mass left lower lobe. Unchanged appearance of subtle bone lesions.  Cycle 5 Alimta/pembrolizumab 08/28/2019  Cycle 6 Alimta/pembrolizumab 09/18/2019  Cycle 7 Alimta/pembrolizumab 10/09/2019  Cycle 8 Alimta/pembrolizumab 10/30/2019  Cycle 9 Alimta/pembrolizumab 11/20/2019  CTs 12/09/2019-no evidence of disease progression, left lower lobe nodule slightly decreased in size, stable L1, left sacral, and left pubic ramus metastases  Cycle 10 Alimta/pembrolizumab 12/11/2019 2. Pain secondary to #1 3. Chronic back pain 4. Type 2 diabetes 5. Essential hypertension 6. CAD 7. Hyperlipidemia 8. Family history significant for multiple members with breast cancer 9. Grade 1 skin rash 07/18/2019 likely related to immunotherapy. Topical steroid cream as needed. 10. E. coli urinary tract infection 07/14/2019. Completed cephalexin. 11. Edema/tenderness at the right greater than left ankle 10/21/2019-etiology unclear, potentially related to systemic therapy or an infection, doxycycline prescribed-improved 10/23/2019; marked improvement 11/20/2019    Disposition: [name] has metastatic non-small cell lung cancer.  She has experienced clinical and radiologic improvement with Alimta/pembrolizumab.  I reviewed the CT images with [name] and her husband.  The plan is to continue Alimta/pembrolizumab.  She will complete another cycle today.  The etiology of the leg/foot edema is unclear.  This is likely secondary to chronic stasis change or possibly related to Alimta and pembrolizumab.  She will elevate the legs and try support stockings.  [name] will try arthritis strength Tylenol for pain.   She will return for an office visit in the next cycle of treatment in 3 weeks.  [name],  MD  12/11/2019  9:38 AM

## 2019-12-11 NOTE — Patient Instructions (Signed)
[facility] Discharge Instructions for Patients Receiving Chemotherapy  Today you received the following Immunotherapy agent: Pembrolizumab (Keytruda) and chemotherapy agent: Pemetrexed (Alimta)  To help prevent nausea and vomiting after your treatment, we encourage you to take your nausea medicationas directed by your MD.   If you develop nausea and vomiting that is not controlled by your nausea medication, call the clinic.   BELOW ARE SYMPTOMS THAT SHOULD BE REPORTED IMMEDIATELY:  *FEVER GREATER THAN 100.5 F  *CHILLS WITH OR WITHOUT FEVER  NAUSEA AND VOMITING THAT IS NOT CONTROLLED WITH YOUR NAUSEA MEDICATION  *UNUSUAL SHORTNESS OF BREATH  *UNUSUAL BRUISING OR BLEEDING  TENDERNESS IN MOUTH AND THROAT WITH OR WITHOUT PRESENCE OF ULCERS  *URINARY PROBLEMS  *BOWEL PROBLEMS  UNUSUAL RASH Items with * indicate a potential emergency and should be followed up as soon as possible.  Feel free to call the clinic should you have any questions or concerns. The clinic phone number is [phone].  Please show the [facility] at check-in to the Emergency Department and triage nurse.  Cyanocobalamin, Vitamin B12 injection What is this medicine? CYANOCOBALAMIN (sye an oh koe BAL a min) is a man made form of vitamin B12. Vitamin B12 is used in the growth of healthy blood cells, nerve cells, and proteins in the body. It also helps with the metabolism of fats and carbohydrates. This medicine is used to treat people who can not absorb vitamin B12. This medicine may be used for other purposes; ask your health care provider or pharmacist if you have questions. COMMON BRAND NAME(S): B-12 Compliance Kit, B-12 Injection Kit, Cyomin, LA-12, Nutri-Twelve, Physicians EZ Use B-12, Primabalt What should I tell my health care provider before I take this medicine? They need to know if you have any of these conditions:  kidney disease  Leber's disease  megaloblastic anemia  an  unusual or allergic reaction to cyanocobalamin, cobalt, other medicines, foods, dyes, or preservatives  pregnant or trying to get pregnant  breast-feeding How should I use this medicine? This medicine is injected into a muscle or deeply under the skin. It is usually given by a health care professional in a clinic or doctor's office. However, your doctor may teach you how to inject yourself. Follow all instructions. Talk to your pediatrician regarding the use of this medicine in children. Special care may be needed. Overdosage: If you think you have taken too much of this medicine contact a poison control center or emergency room at once. NOTE: This medicine is only for you. Do not share this medicine with others. What if I miss a dose? If you are given your dose at a clinic or doctor's office, call to reschedule your appointment. If you give your own injections and you miss a dose, take it as soon as you can. If it is almost time for your next dose, take only that dose. Do not take double or extra doses. What may interact with this medicine?  colchicine  heavy alcohol intake This list may not describe all possible interactions. Give your health care provider a list of all the medicines, herbs, non-prescription drugs, or dietary supplements you use. Also tell them if you smoke, drink alcohol, or use illegal drugs. Some items may interact with your medicine. What should I watch for while using this medicine? Visit your doctor or health care professional regularly. You may need blood work done while you are taking this medicine. You may need to follow a special diet. Talk  to your doctor. Limit your alcohol intake and avoid smoking to get the best benefit. What side effects may I notice from receiving this medicine? Side effects that you should report to your doctor or health care professional as soon as possible:  allergic reactions like skin rash, itching or hives, swelling of the face, lips,  or tongue  blue tint to skin  chest tightness, pain  difficulty breathing, wheezing  dizziness  red, swollen painful area on the leg Side effects that usually do not require medical attention (report to your doctor or health care professional if they continue or are bothersome):  diarrhea  headache This list may not describe all possible side effects. Call your doctor for medical advice about side effects. You may report side effects to FDA at 1-800-FDA-1088. Where should I keep my medicine? Keep out of the reach of children. Store at room temperature between 15 and 30 degrees C (59 and 85 degrees F). Protect from light. Throw away any unused medicine after the expiration date. NOTE: This sheet is a summary. It may not cover all possible information. If you have questions about this medicine, talk to your doctor, pharmacist, or health care provider.  2020 Elsevier/Gold Standard (2007-10-28 22:10:20) Coronavirus (COVID-19) Are you at risk?  Are you at risk for the Coronavirus (COVID-19)?  To be considered HIGH RISK for Coronavirus (COVID-19), you have to meet the following criteria:  . Traveled to [place], [place], [place], [place] or [place]; or in the [place] to [place], [place], [place], or [place]; and have fever, cough, and shortness of breath within the last 2 weeks of travel OR . Been in close contact with a person diagnosed with COVID-19 within the last 2 weeks and have fever, cough, and shortness of breath . IF YOU DO NOT MEET THESE CRITERIA, YOU ARE CONSIDERED LOW RISK FOR COVID-19.  What to do if you are HIGH RISK for COVID-19?  Marland Kitchen If you are having a medical emergency, call 911. . Seek medical care right away. Before you go to a doctor's office, urgent care or emergency department, call ahead and tell them about your recent travel, contact with someone diagnosed with COVID-19, and your symptoms. You should receive instructions from your physician's office  regarding next steps of care.  . When you arrive at healthcare provider, tell the healthcare staff immediately you have returned from visiting [place], [place], [place], [place] or [place]; or traveled in the [place] to [place], [place], [place], or [place]; in the last two weeks or you have been in close contact with a person diagnosed with COVID-19 in the last 2 weeks.   . Tell the health care staff about your symptoms: fever, cough and shortness of breath. . After you have been seen by a medical provider, you will be either: o Tested for (COVID-19) and discharged home on quarantine except to seek medical care if symptoms worsen, and asked to  - Stay home and avoid contact with others until you get your results (4-5 days)  - Avoid travel on public transportation if possible (such as bus, train, or airplane) or o Sent to the Emergency Department by EMS for evaluation, COVID-19 testing, and possible admission depending on your condition and test results.  What to do if you are LOW RISK for COVID-19?  Reduce your risk of any infection by using the same precautions used for avoiding the common cold or flu:  Marland Kitchen Wash your hands often with  soap and warm water for at least 20 seconds.  If soap and water are not readily available, use an alcohol-based hand sanitizer with at least 60% alcohol.  . If coughing or sneezing, cover your mouth and nose by coughing or sneezing into the elbow areas of your shirt or coat, into a tissue or into your sleeve (not your hands). . Avoid shaking hands with others and consider head nods or verbal greetings only. . Avoid touching your eyes, nose, or mouth with unwashed hands.  . Avoid close contact with people who are sick. . Avoid places or events with large numbers of people in one location, like concerts or sporting events. . Carefully consider travel plans you have or are making. . If you are planning any travel outside or inside the [place], visit the CDC's  Travelers' Health webpage for the latest health notices. . If you have some symptoms but not all symptoms, continue to monitor at home and seek medical attention if your symptoms worsen. . If you are having a medical emergency, call 911.   [place] / e-Visit: [email or web address]         [facility]: [facility]: [phone]                   [facility]: [phone]

## 2019-12-12 ENCOUNTER — Telehealth: Payer: Self-pay | Admitting: Oncology

## 2019-12-12 NOTE — Telephone Encounter (Signed)
Scheduled per los. Called and spoke with patient. Confirmed appt 

## 2019-12-19 ENCOUNTER — Telehealth: Payer: Self-pay | Admitting: *Deleted

## 2019-12-19 MED ORDER — CEPHALEXIN 500 MG PO CAPS: 500.0000 mg | ORAL_CAPSULE | Freq: Three times a day (TID) | ORAL | 0 refills | Status: DC

## 2019-12-19 NOTE — Telephone Encounter (Addendum)
Reports open sore on her RLE is more painful and getting more red. Also has red discoloration at ankle area same leg. It seems to be more warm to touch than other leg. Denies fever. Asking if she needs an antibiotic or to be seen? Per Dr. Benay Spice: Start Keflex 500 mg tid x 5 days. Call if worsens or does not improve. Next step would be to refer her to dermatologist. Patient notified.

## 2019-12-29 ENCOUNTER — Other Ambulatory Visit: Payer: Self-pay | Admitting: Oncology

## 2019-12-30 ENCOUNTER — Other Ambulatory Visit: Payer: Self-pay | Admitting: *Deleted

## 2019-12-30 DIAGNOSIS — R918 Other nonspecific abnormal finding of lung field: Secondary | ICD-10-CM

## 2019-12-30 DIAGNOSIS — M899 Disorder of bone, unspecified: Secondary | ICD-10-CM

## 2019-12-30 MED ORDER — FOLIC ACID 1 MG PO TABS: ORAL_TABLET | ORAL | 1 refills | Status: DC

## 2020-01-02 ENCOUNTER — Inpatient Hospital Stay: Payer: Medicare Other | Attending: Nurse Practitioner | Admitting: Nurse Practitioner

## 2020-01-02 ENCOUNTER — Inpatient Hospital Stay: Payer: Medicare Other

## 2020-01-02 ENCOUNTER — Encounter: Payer: Self-pay | Admitting: *Deleted

## 2020-01-02 ENCOUNTER — Other Ambulatory Visit: Payer: Self-pay

## 2020-01-02 ENCOUNTER — Encounter: Payer: Self-pay | Admitting: Nurse Practitioner

## 2020-01-02 VITALS — BP 135/71 | HR 60 | Temp 97.5°F | Resp 17 | Ht 65.0 in | Wt 181.3 lb

## 2020-01-02 DIAGNOSIS — Z79899 Other long term (current) drug therapy: Secondary | ICD-10-CM | POA: Insufficient documentation

## 2020-01-02 DIAGNOSIS — C3492 Malignant neoplasm of unspecified part of left bronchus or lung: Secondary | ICD-10-CM | POA: Diagnosis not present

## 2020-01-02 DIAGNOSIS — Z5112 Encounter for antineoplastic immunotherapy: Secondary | ICD-10-CM | POA: Insufficient documentation

## 2020-01-02 DIAGNOSIS — C7951 Secondary malignant neoplasm of bone: Secondary | ICD-10-CM | POA: Insufficient documentation

## 2020-01-02 DIAGNOSIS — C3432 Malignant neoplasm of lower lobe, left bronchus or lung: Secondary | ICD-10-CM | POA: Insufficient documentation

## 2020-01-02 DIAGNOSIS — Z95828 Presence of other vascular implants and grafts: Secondary | ICD-10-CM

## 2020-01-02 LAB — CBC WITH DIFFERENTIAL (CANCER CENTER ONLY)
Abs Immature Granulocytes: 0.01 10*3/uL (ref 0.00–0.07)
Basophils Absolute: 0 10*3/uL (ref 0.0–0.1)
Basophils Relative: 0 %
Eosinophils Absolute: 0.1 10*3/uL (ref 0.0–0.5)
Eosinophils Relative: 2 %
HCT: 33.9 % — ABNORMAL LOW (ref 36.0–46.0)
Hemoglobin: 10.8 g/dL — ABNORMAL LOW (ref 12.0–15.0)
Immature Granulocytes: 0 %
Lymphocytes Relative: 21 %
Lymphs Abs: 1 10*3/uL (ref 0.7–4.0)
MCH: 31.6 pg (ref 26.0–34.0)
MCHC: 31.9 g/dL (ref 30.0–36.0)
MCV: 99.1 fL (ref 80.0–100.0)
Monocytes Absolute: 0.6 10*3/uL (ref 0.1–1.0)
Monocytes Relative: 13 %
Neutro Abs: 3 10*3/uL (ref 1.7–7.7)
Neutrophils Relative %: 64 %
Platelet Count: 200 10*3/uL (ref 150–400)
RBC: 3.42 MIL/uL — ABNORMAL LOW (ref 3.87–5.11)
RDW: 14.6 % (ref 11.5–15.5)
WBC Count: 4.8 10*3/uL (ref 4.0–10.5)
nRBC: 0 % (ref 0.0–0.2)

## 2020-01-02 LAB — CMP (CANCER CENTER ONLY)
ALT: 18 U/L (ref 0–44)
AST: 28 U/L (ref 15–41)
Albumin: 3.6 g/dL (ref 3.5–5.0)
Alkaline Phosphatase: 87 U/L (ref 38–126)
Anion gap: 10 (ref 5–15)
BUN: 20 mg/dL (ref 8–23)
CO2: 23 mmol/L (ref 22–32)
Calcium: 10 mg/dL (ref 8.9–10.3)
Chloride: 106 mmol/L (ref 98–111)
Creatinine: 0.92 mg/dL (ref 0.44–1.00)
GFR, Est AFR Am: >60 mL/min (ref >60)
GFR, Estimated: 58 mL/min — ABNORMAL LOW (ref >60)
Glucose, Bld: 104 mg/dL — ABNORMAL HIGH (ref 70–99)
Potassium: 4.7 mmol/L (ref 3.5–5.1)
Sodium: 139 mmol/L (ref 135–145)
Total Bilirubin: 0.2 mg/dL — ABNORMAL LOW (ref 0.3–1.2)
Total Protein: 7.1 g/dL (ref 6.5–8.1)

## 2020-01-02 MED ORDER — HEPARIN SOD (PORK) LOCK FLUSH 100 UNIT/ML IV SOLN
500.0000 [IU] | Freq: Once | INTRAVENOUS | Status: AC | PRN
  Administered 2020-01-02: 500 [IU]
  Filled 2020-01-02: qty 5

## 2020-01-02 MED ORDER — SODIUM CHLORIDE 0.9 % IV SOLN
200.0000 mg | Freq: Once | INTRAVENOUS | Status: AC
  Administered 2020-01-02: 200 mg via INTRAVENOUS
  Filled 2020-01-02: qty 8

## 2020-01-02 MED ORDER — SODIUM CHLORIDE 0.9% FLUSH
10.0000 mL | Freq: Once | INTRAVENOUS | Status: AC
  Administered 2020-01-02: 10 mL
  Filled 2020-01-02: qty 10

## 2020-01-02 MED ORDER — SODIUM CHLORIDE 0.9% FLUSH
10.0000 mL | INTRAVENOUS | Status: DC | PRN
  Administered 2020-01-02: 10 mL
  Filled 2020-01-02: qty 10

## 2020-01-02 MED ORDER — SODIUM CHLORIDE 0.9 % IV SOLN
Freq: Once | INTRAVENOUS | Status: AC
  Filled 2020-01-02: qty 250

## 2020-01-02 NOTE — Progress Notes (Signed)
Spoke w/ Lattie Haw - patient continues to have lower extremity swelling/redness after treatment days, so alimta (and compazine) will be held today to see if that helps. Pembrolizumab only today.   Demetrius Charity, PharmD, BCPS, [facility] Pharmacist Pharmacy Phone: [phone] 01/02/2020

## 2020-01-02 NOTE — Progress Notes (Signed)
Referral order, demographics, office note and labs faxed to Dr. Wilhemina Bonito office [phone]

## 2020-01-02 NOTE — Progress Notes (Signed)
[facility] OFFICE PROGRESS NOTE   Diagnosis: Non-small cell lung cancer  INTERVAL HISTORY:   [name] returns as scheduled.  She completed cycle 10 Alimta/pembrolizumab 12/11/2019.  She tends to have mild nausea on day 3.  No mouth sores.  No significant diarrhea.  She continues to have a good appetite.  Main complaint continues to be lower extremity pain, swelling, erythema 3 to 4 days after each treatment.  She completed a 5-day cycle of Keflex for the symptoms beginning 12/19/2019.  She notes improvement.  Objective:  Vital signs in last 24 hours:  Blood pressure 135/71, pulse 60, temperature (!) 97.5 F (36.4 C), temperature source Temporal, resp. rate 17, height _0  (1.651 m), weight 181 lb 4.8 oz (82.2 kg), SpO2 96 %.    HEENT: No thrush or ulcers. Resp: Lungs clear bilaterally. Cardio: Regular rate and rhythm. GI: Abdomen soft and nontender.  No hepatomegaly. Vascular: Trace edema at the lower legs bilaterally.  Nonhealing scabbed lesion at the right lower anterior leg.  Hyperpigmentation, skin thickening right greater than left lower leg.  Mild erythema at the lower legs.  Skin: Nonhealing scabbed lesion at the right lower anterior leg.  Hyperpigmentation, skin thickening right greater than left lower leg.  Mild erythema at the lower legs.  Tenderness at the lower legs with light palpation. Port-A-Cath without erythema.  Lab Results:  Lab Results  Component Value Date   WBC 4.8 12/11/2019   HGB 11.3 (L) 12/11/2019   HCT 35.7 (L) 12/11/2019   MCV 99.7 12/11/2019   PLT 210 12/11/2019   NEUTROABS 3.2 12/11/2019    Imaging:  No results found.  Medications: I have reviewed the patient's current medications.  Assessment/Plan: 1. Non-small cell lung cancer  MRI lumbar spine 04/29/2019-enlarging marrow lesions involving the L1 vertebral body, upper left sacrum and right iliac bone  MRI pelvis 04/29/2019-3.5 cm left iliac bone lesion appears  slightly larger; other similar appearing lesions present within the left superior pubic ramus, left superior abdomen acetabulum and upper left sacrum  Kappa free light chains with mild elevation 05/12/2019  CTs10/07/2019-left lower lobe pulmonary mass 3.3 x 3.2 cm; lytic process left iliac bone; spinal lesions; 1.1 cm low-density left kidney lesion; right thyroid enlargement with heterogeneous appearance with potential for multiple discrete lesions  Biopsy left lower lobe lung mass 05/26/2019-poorly differentiated carcinoma; positive for cytokeratin 5/6, p63 and TTF-1, no EGFR, BRAF, ALK,ERBB2,ROS, orNTRKalteration  Cycle 1 carboplatin/Alimta/pembrolizumab 06/06/2019  Cycle 2 carboplatin/Alimta/pembrolizumab 06/27/2019  Cycle 3 carboplatin/Alimta/pembrolizumab 07/18/2019  Cycle 4 carboplatin/Alimta/pembrolizumab1/01/2020  CTs 08/27/2019-significant decrease in size of lobulated mass left lower lobe. Unchanged appearance of subtle bone lesions.  Cycle 5 Alimta/pembrolizumab 08/28/2019  Cycle 6 Alimta/pembrolizumab 09/18/2019  Cycle 7 Alimta/pembrolizumab 10/09/2019  Cycle 8 Alimta/pembrolizumab 10/30/2019  Cycle 9 Alimta/pembrolizumab 11/20/2019  CTs 12/09/2019-no evidence of disease progression, left lower lobe nodule slightly decreased in size, stable L1, left sacral, and left pubic ramus metastases  Cycle 10 Alimta/pembrolizumab 12/11/2019  Cycle 11 pembrolizumab alone 01/02/2020 (Alimta held due to edema, tenderness, erythema at the lower legs) 2. Pain secondary to #1 3. Chronic back pain 4. Type 2 diabetes 5. Essential hypertension 6. CAD 7. Hyperlipidemia 8. Family history significant for multiple members with breast cancer 9. Grade 1 skin rash 07/18/2019 likely related to immunotherapy. Topical steroid cream as needed. 10. E. coli urinary tract infection 07/14/2019. Completed cephalexin. 11. Edema/tenderness at the right greater than left ankle 10/21/2019-etiology  unclear, potentially related to systemic therapy or an infection, doxycycline  prescribed-improved 10/23/2019; marked improvement 11/20/2019; at office visit 01/02/2020 she reports worsening of lower extremity edema, pain/tenderness, erythema 3 to 4 days following each treatment.  Alimta held 01/02/2020.  Referral to dermatology.   Disposition: [name] appears stable.  She has completed 10 cycles of Alimta/pembrolizumab.  Plan to proceed with pembrolizumab alone today.  Alimta being held due to the edema, tenderness, erythema at the lower legs.  We made a referral to dermatology.  She agrees with this plan.  We reviewed the CBC from today.  Counts adequate to proceed with treatment.  She will return for lab, follow-up, treatment in 3 weeks.    Ned Card ANP/GNP-BC   01/02/2020  10:09 AM

## 2020-01-02 NOTE — Patient Instructions (Signed)
Pembrolizumab injection What is this medicine? PEMBROLIZUMAB (pem broe liz ue mab) is a monoclonal antibody. It is used to treat certain types of cancer. This medicine may be used for other purposes; ask your health care provider or pharmacist if you have questions. COMMON BRAND NAME(S): Keytruda What should I tell my health care provider before I take this medicine? They need to know if you have any of these conditions:  diabetes  immune system problems  inflammatory bowel disease  liver disease  lung or breathing disease  lupus  received or scheduled to receive an organ transplant or a stem-cell transplant that uses donor stem cells  an unusual or allergic reaction to pembrolizumab, other medicines, foods, dyes, or preservatives  pregnant or trying to get pregnant  breast-feeding How should I use this medicine? This medicine is for infusion into a vein. It is given by a health care professional in a hospital or clinic setting. A special MedGuide will be given to you before each treatment. Be sure to read this information carefully each time. Talk to your pediatrician regarding the use of this medicine in children. While this drug may be prescribed for children as young as 6 months for selected conditions, precautions do apply. Overdosage: If you think you have taken too much of this medicine contact a poison control center or emergency room at once. NOTE: This medicine is only for you. Do not share this medicine with others. What if I miss a dose? It is important not to miss your dose. Call your doctor or health care professional if you are unable to keep an appointment. What may interact with this medicine? Interactions have not been studied. Give your health care provider a list of all the medicines, herbs, non-prescription drugs, or dietary supplements you use. Also tell them if you smoke, drink alcohol, or use illegal drugs. Some items may interact with your medicine. This  list may not describe all possible interactions. Give your health care provider a list of all the medicines, herbs, non-prescription drugs, or dietary supplements you use. Also tell them if you smoke, drink alcohol, or use illegal drugs. Some items may interact with your medicine. What should I watch for while using this medicine? Your condition will be monitored carefully while you are receiving this medicine. You may need blood work done while you are taking this medicine. Do not become pregnant while taking this medicine or for 4 months after stopping it. Women should inform their doctor if they wish to become pregnant or think they might be pregnant. There is a potential for serious side effects to an unborn child. Talk to your health care professional or pharmacist for more information. Do not breast-feed an infant while taking this medicine or for 4 months after the last dose. What side effects may I notice from receiving this medicine? Side effects that you should report to your doctor or health care professional as soon as possible:  allergic reactions like skin rash, itching or hives, swelling of the face, lips, or tongue  bloody or black, tarry  breathing problems  changes in vision  chest pain  chills  confusion  constipation  cough  diarrhea  dizziness or feeling faint or lightheaded  fast or irregular heartbeat  fever  flushing  joint pain  low blood counts - this medicine may decrease the number of white blood cells, red blood cells and platelets. You may be at increased risk for infections and bleeding.  muscle pain  muscle   weakness  pain, tingling, numbness in the hands or feet  persistent headache  redness, blistering, peeling or loosening of the skin, including inside the mouth  signs and symptoms of high blood sugar such as dizziness; dry mouth; dry skin; fruity breath; nausea; stomach pain; increased hunger or thirst; increased urination  signs  and symptoms of kidney injury like trouble passing urine or change in the amount of urine  signs and symptoms of liver injury like dark urine, light-colored stools, loss of appetite, nausea, right upper belly pain, yellowing of the eyes or skin  sweating  swollen lymph nodes  weight loss Side effects that usually do not require medical attention (report to your doctor or health care professional if they continue or are bothersome):  decreased appetite  hair loss  muscle pain  tiredness This list may not describe all possible side effects. Call your doctor for medical advice about side effects. You may report side effects to FDA at 1-800-FDA-1088. Where should I keep my medicine? This drug is given in a hospital or clinic and will not be stored at home. NOTE: This sheet is a summary. It may not cover all possible information. If you have questions about this medicine, talk to your doctor, pharmacist, or health care provider.  2020 Elsevier/Gold Standard (2019-05-23 18:07:58)  

## 2020-01-05 ENCOUNTER — Telehealth: Payer: Self-pay | Admitting: *Deleted

## 2020-01-05 NOTE — Telephone Encounter (Signed)
Called to report first available with Dr. Wilhemina Bonito is 02/05/20. Asking if that will be OK or can our office call them to get her seen sooner. Informed her that it will be OK to wait till 02/05/20 and that our office has no influence on when she is seen. The referral was entered as a routine. Suggested she call them back and ask to be put on waiting list for cancellation.

## 2020-01-16 ENCOUNTER — Encounter: Payer: Self-pay | Admitting: Oncology

## 2020-01-17 ENCOUNTER — Other Ambulatory Visit: Payer: Self-pay | Admitting: Oncology

## 2020-01-19 NOTE — Progress Notes (Signed)
Pharmacist Chemotherapy Monitoring - Follow Up Assessment    I verify that I have reviewed each item in the below checklist:  . Regimen for the patient is scheduled for the appropriate day and plan matches scheduled date. Marland Kitchen Appropriate non-routine labs are ordered dependent on drug ordered. . If applicable, additional medications reviewed and ordered per protocol based on lifetime cumulative doses and/or treatment regimen.   Plan for follow-up and/or issues identified: Yes . I-vent associated with next due treatment: Yes . MD and/or nursing notified: No   Kennith Center, Pharm.D., CPP 01/19/2020@10 :23 AM

## 2020-01-23 ENCOUNTER — Other Ambulatory Visit: Payer: Self-pay

## 2020-01-23 ENCOUNTER — Inpatient Hospital Stay: Payer: Medicare Other

## 2020-01-23 ENCOUNTER — Inpatient Hospital Stay (HOSPITAL_BASED_OUTPATIENT_CLINIC_OR_DEPARTMENT_OTHER): Payer: Medicare Other | Admitting: Oncology

## 2020-01-23 VITALS — BP 142/72 | HR 58 | Temp 97.9°F | Resp 20 | Ht 65.0 in | Wt 179.5 lb

## 2020-01-23 DIAGNOSIS — C3492 Malignant neoplasm of unspecified part of left bronchus or lung: Secondary | ICD-10-CM

## 2020-01-23 DIAGNOSIS — Z95828 Presence of other vascular implants and grafts: Secondary | ICD-10-CM

## 2020-01-23 DIAGNOSIS — Z5112 Encounter for antineoplastic immunotherapy: Secondary | ICD-10-CM | POA: Diagnosis not present

## 2020-01-23 LAB — CBC WITH DIFFERENTIAL (CANCER CENTER ONLY)
Abs Immature Granulocytes: 0.01 10*3/uL (ref 0.00–0.07)
Basophils Absolute: 0 10*3/uL (ref 0.0–0.1)
Basophils Relative: 0 %
Eosinophils Absolute: 0.1 10*3/uL (ref 0.0–0.5)
Eosinophils Relative: 2 %
HCT: 35.2 % — ABNORMAL LOW (ref 36.0–46.0)
Hemoglobin: 11.2 g/dL — ABNORMAL LOW (ref 12.0–15.0)
Immature Granulocytes: 0 %
Lymphocytes Relative: 30 %
Lymphs Abs: 1.1 10*3/uL (ref 0.7–4.0)
MCH: 31 pg (ref 26.0–34.0)
MCHC: 31.8 g/dL (ref 30.0–36.0)
MCV: 97.5 fL (ref 80.0–100.0)
Monocytes Absolute: 0.5 10*3/uL (ref 0.1–1.0)
Monocytes Relative: 12 %
Neutro Abs: 2.2 10*3/uL (ref 1.7–7.7)
Neutrophils Relative %: 56 %
Platelet Count: 150 10*3/uL (ref 150–400)
RBC: 3.61 MIL/uL — ABNORMAL LOW (ref 3.87–5.11)
RDW: 13.9 % (ref 11.5–15.5)
WBC Count: 3.9 10*3/uL — ABNORMAL LOW (ref 4.0–10.5)
nRBC: 0 % (ref 0.0–0.2)

## 2020-01-23 LAB — CMP (CANCER CENTER ONLY)
ALT: 18 U/L (ref 0–44)
AST: 26 U/L (ref 15–41)
Albumin: 3.9 g/dL (ref 3.5–5.0)
Alkaline Phosphatase: 85 U/L (ref 38–126)
Anion gap: 10 (ref 5–15)
BUN: 20 mg/dL (ref 8–23)
CO2: 22 mmol/L (ref 22–32)
Calcium: 9.9 mg/dL (ref 8.9–10.3)
Chloride: 107 mmol/L (ref 98–111)
Creatinine: 0.98 mg/dL (ref 0.44–1.00)
GFR, Est AFR Am: >60 mL/min (ref >60)
GFR, Estimated: 54 mL/min — ABNORMAL LOW (ref >60)
Glucose, Bld: 97 mg/dL (ref 70–99)
Potassium: 5.1 mmol/L (ref 3.5–5.1)
Sodium: 139 mmol/L (ref 135–145)
Total Bilirubin: 0.3 mg/dL (ref 0.3–1.2)
Total Protein: 7.2 g/dL (ref 6.5–8.1)

## 2020-01-23 LAB — VITAMIN B12: Vitamin B-12: 305 pg/mL (ref 180–914)

## 2020-01-23 LAB — TSH: TSH: 0.663 u[IU]/mL (ref 0.308–3.960)

## 2020-01-23 MED ORDER — PROCHLORPERAZINE MALEATE 10 MG PO TABS
ORAL_TABLET | ORAL | Status: AC
  Filled 2020-01-23: qty 1

## 2020-01-23 MED ORDER — SODIUM CHLORIDE 0.9 % IV SOLN
Freq: Once | INTRAVENOUS | Status: AC
  Filled 2020-01-23: qty 250

## 2020-01-23 MED ORDER — CYANOCOBALAMIN 1000 MCG/ML IJ SOLN
1000.0000 ug | Freq: Once | INTRAMUSCULAR | Status: AC
  Administered 2020-01-23: 1000 ug via INTRAMUSCULAR

## 2020-01-23 MED ORDER — HEPARIN SOD (PORK) LOCK FLUSH 100 UNIT/ML IV SOLN
500.0000 [IU] | Freq: Once | INTRAVENOUS | Status: AC | PRN
  Administered 2020-01-23: 500 [IU]
  Filled 2020-01-23: qty 5

## 2020-01-23 MED ORDER — SODIUM CHLORIDE 0.9 % IV SOLN: 500.0000 mg/m2 | Freq: Once | INTRAVENOUS | Status: DC

## 2020-01-23 MED ORDER — PROCHLORPERAZINE MALEATE 10 MG PO TABS: 10.0000 mg | ORAL_TABLET | Freq: Once | ORAL | Status: DC

## 2020-01-23 MED ORDER — SODIUM CHLORIDE 0.9 % IV SOLN
200.0000 mg | Freq: Once | INTRAVENOUS | Status: AC
  Administered 2020-01-23: 200 mg via INTRAVENOUS
  Filled 2020-01-23: qty 8

## 2020-01-23 MED ORDER — SODIUM CHLORIDE 0.9% FLUSH
10.0000 mL | INTRAVENOUS | Status: DC | PRN
  Administered 2020-01-23: 10 mL
  Filled 2020-01-23: qty 10

## 2020-01-23 MED ORDER — CYANOCOBALAMIN 1000 MCG/ML IJ SOLN
INTRAMUSCULAR | Status: AC
  Filled 2020-01-23: qty 1

## 2020-01-23 MED ORDER — SODIUM CHLORIDE 0.9% FLUSH
10.0000 mL | Freq: Once | INTRAVENOUS | Status: AC
  Administered 2020-01-23: 10 mL
  Filled 2020-01-23: qty 10

## 2020-01-23 NOTE — Patient Instructions (Signed)
[facility] Discharge Instructions for Patients Receiving Chemotherapy  Today you received the following chemotherapy agents Pembrolizumab (KEYTRUDA) & Pemetrexed (ALIMTA)  To help prevent nausea and vomiting after your treatment, we encourage you to take your nausea medication as prescribed.   If you develop nausea and vomiting that is not controlled by your nausea medication, call the clinic.   BELOW ARE SYMPTOMS THAT SHOULD BE REPORTED IMMEDIATELY:  *FEVER GREATER THAN 100.5 F  *CHILLS WITH OR WITHOUT FEVER  NAUSEA AND VOMITING THAT IS NOT CONTROLLED WITH YOUR NAUSEA MEDICATION  *UNUSUAL SHORTNESS OF BREATH  *UNUSUAL BRUISING OR BLEEDING  TENDERNESS IN MOUTH AND THROAT WITH OR WITHOUT PRESENCE OF ULCERS  *URINARY PROBLEMS  *BOWEL PROBLEMS  UNUSUAL RASH Items with * indicate a potential emergency and should be followed up as soon as possible.  Feel free to call the clinic should you have any questions or concerns. The clinic phone number is [phone].  Please show the [facility] at check-in to the Emergency Department and triage nurse.

## 2020-01-23 NOTE — Progress Notes (Signed)
[facility] OFFICE PROGRESS NOTE   Diagnosis: Non-small cell lung cancer  INTERVAL HISTORY:   [name] completed a cycle of pembrolizumab on 01/02/2020.  The lower leg swelling and rash are much improved.  She has a good appetite.  Mild increase in upper back pain.  Stable pain at the left hip.  She reports balance difficulty and foot numbness.  She relates the balance abnormality to difficulty with leg/foot strength and control.  No falls.  No difficulty with bowel or bladder function.  Objective:  Vital signs in last 24 hours:  Blood pressure (!) 142/72, pulse (!) 58, temperature 97.9 F (36.6 C), temperature source Temporal, resp. rate 20, height '5\' 5"'  (1.651 m), weight 179 lb 8 oz (81.4 kg), SpO2 95 %.   Resp: Lungs clear bilaterally Cardio: Regular rate and rhythm, 2/6 systolic murmur GI: No hepatomegaly Vascular: No leg edema Neuro: The motor exam appears intact in the lower extremities bilaterally.  She ambulates with a slightly unsteady gait.  Sensation is intact to light touch and pinprick at the lower legs and feet.  Finger-to-nose testing is normal. Skin: Hyperpigmentation of the lower leg bilaterally.  Portacath/PICC-without erythema  Lab Results:  Lab Results  Component Value Date   WBC 3.9 (L) 01/23/2020   HGB 11.2 (L) 01/23/2020   HCT 35.2 (L) 01/23/2020   MCV 97.5 01/23/2020   PLT 150 01/23/2020   NEUTROABS 2.2 01/23/2020    CMP  Lab Results  Component Value Date   NA 139 01/23/2020   K 5.1 01/23/2020   CL 107 01/23/2020   CO2 22 01/23/2020   GLUCOSE 97 01/23/2020   BUN 20 01/23/2020   CREATININE 0.98 01/23/2020   CALCIUM 9.9 01/23/2020   PROT 7.2 01/23/2020   ALBUMIN 3.9 01/23/2020   AST 26 01/23/2020   ALT 18 01/23/2020   ALKPHOS 85 01/23/2020   BILITOT 0.3 01/23/2020   GFRNONAA 54 (L) 01/23/2020   GFRAA >60 01/23/2020     Medications: I have reviewed the patient's current medications.   Assessment/Plan: 1. Non-small  cell lung cancer  MRI lumbar spine 04/29/2019-enlarging marrow lesions involving the L1 vertebral body, upper left sacrum and right iliac bone  MRI pelvis 04/29/2019-3.5 cm left iliac bone lesion appears slightly larger; other similar appearing lesions present within the left superior pubic ramus, left superior abdomen acetabulum and upper left sacrum  Kappa free light chains with mild elevation 05/12/2019  CTs10/07/2019-left lower lobe pulmonary mass 3.3 x 3.2 cm; lytic process left iliac bone; spinal lesions; 1.1 cm low-density left kidney lesion; right thyroid enlargement with heterogeneous appearance with potential for multiple discrete lesions  Biopsy left lower lobe lung mass 05/26/2019-poorly differentiated carcinoma; positive for cytokeratin 5/6, p63 and TTF-1, no EGFR, BRAF, ALK,ERBB2,ROS, orNTRKalteration  Cycle 1 carboplatin/Alimta/pembrolizumab 06/06/2019  Cycle 2 carboplatin/Alimta/pembrolizumab 06/27/2019  Cycle 3 carboplatin/Alimta/pembrolizumab 07/18/2019  Cycle 4 carboplatin/Alimta/pembrolizumab1/01/2020  CTs 08/27/2019-significant decrease in size of lobulated mass left lower lobe. Unchanged appearance of subtle bone lesions.  Cycle 5 Alimta/pembrolizumab 08/28/2019  Cycle 6 Alimta/pembrolizumab 09/18/2019  Cycle 7 Alimta/pembrolizumab 10/09/2019  Cycle 8 Alimta/pembrolizumab 10/30/2019  Cycle 9 Alimta/pembrolizumab 11/20/2019  CTs 12/09/2019-no evidence of disease progression, left lower lobe nodule slightly decreased in size, stable L1, left sacral, and left pubic ramus metastases  Cycle 10 Alimta/pembrolizumab 12/11/2019  Cycle 11 pembrolizumab alone 01/02/2020 (Alimta held due to edema, tenderness, erythema at the lower legs)  Cycle 12 pembrolizumab 01/23/2020 2. Pain secondary to #1 3. Chronic back pain 4. Type 2  diabetes 5. Essential hypertension 6. CAD 7. Hyperlipidemia 8. Family history significant for multiple members with breast cancer 9. Grade 1  skin rash 07/18/2019 likely related to immunotherapy. Topical steroid cream as needed. 10. E. coli urinary tract infection 07/14/2019. Completed cephalexin. 11. Edema/tenderness at the right greater than left ankle 10/21/2019-etiology unclear, potentially related to systemic therapy or an infection, doxycycline prescribed-improved 10/23/2019; marked improvement 11/20/2019; at office visit 01/02/2020 she reports worsening of lower extremity edema, pain/tenderness, erythema 3 to 4 days following each treatment.  Alimta held 01/02/2020.  Referral to dermatology.    Disposition: [name] is tolerating the pembrolizumab well.  She will complete another treatment today.  The skin rash, edema, and pain at the lower legs has improved.  It is possible she is developing neuropathy related to Alimta.  I doubt she has neuropathy from pembrolizumab or a spine cause for her symptoms.  The plan is to continue single agent pembrolizumab.  She will use a cane and walks slowly.  She will call for progressive neuropathy symptoms.  [name] will return for an office visit in 3 weeks.  [name], MD  01/23/2020  9:14 AM

## 2020-01-23 NOTE — Progress Notes (Signed)
Hold Alimta today per Dr Benay Spice

## 2020-01-29 ENCOUNTER — Telehealth: Payer: Self-pay | Admitting: *Deleted

## 2020-01-29 NOTE — Telephone Encounter (Signed)
Received vm call from pt stating that she has a place on her leg that Ned Card NP referred her to Dr Wilhemina Bonito & she has an upcoming appt on the 8th.  She reports that this looks better but she has same type of area on her other leg now. She is asking if she still needs to see dermatologist.  Discussed with Lattie Haw & she thinks she should still go.  Informed pt.

## 2020-01-30 LAB — IFOBT (OCCULT BLOOD): IFOBT: NEGATIVE

## 2020-02-02 ENCOUNTER — Encounter: Payer: Self-pay | Admitting: Oncology

## 2020-02-08 ENCOUNTER — Other Ambulatory Visit: Payer: Self-pay | Admitting: Oncology

## 2020-02-12 ENCOUNTER — Telehealth: Payer: Self-pay | Admitting: Nurse Practitioner

## 2020-02-12 ENCOUNTER — Other Ambulatory Visit: Payer: Self-pay

## 2020-02-12 ENCOUNTER — Inpatient Hospital Stay: Payer: Medicare Other

## 2020-02-12 ENCOUNTER — Inpatient Hospital Stay: Payer: Medicare Other | Attending: Oncology

## 2020-02-12 ENCOUNTER — Inpatient Hospital Stay (HOSPITAL_BASED_OUTPATIENT_CLINIC_OR_DEPARTMENT_OTHER): Payer: Medicare Other | Admitting: Nurse Practitioner

## 2020-02-12 ENCOUNTER — Encounter: Payer: Self-pay | Admitting: Nurse Practitioner

## 2020-02-12 VITALS — BP 156/75 | HR 59 | Temp 97.9°F | Resp 17 | Ht 65.0 in | Wt 179.7 lb

## 2020-02-12 DIAGNOSIS — C3432 Malignant neoplasm of lower lobe, left bronchus or lung: Secondary | ICD-10-CM | POA: Diagnosis present

## 2020-02-12 DIAGNOSIS — C3492 Malignant neoplasm of unspecified part of left bronchus or lung: Secondary | ICD-10-CM

## 2020-02-12 DIAGNOSIS — C7951 Secondary malignant neoplasm of bone: Secondary | ICD-10-CM | POA: Diagnosis not present

## 2020-02-12 DIAGNOSIS — Z5112 Encounter for antineoplastic immunotherapy: Secondary | ICD-10-CM | POA: Insufficient documentation

## 2020-02-12 DIAGNOSIS — Z95828 Presence of other vascular implants and grafts: Secondary | ICD-10-CM

## 2020-02-12 LAB — CBC WITH DIFFERENTIAL (CANCER CENTER ONLY)
Abs Immature Granulocytes: 0 10*3/uL (ref 0.00–0.07)
Basophils Absolute: 0 10*3/uL (ref 0.0–0.1)
Basophils Relative: 0 %
Eosinophils Absolute: 0.1 10*3/uL (ref 0.0–0.5)
Eosinophils Relative: 1 %
HCT: 35.8 % — ABNORMAL LOW (ref 36.0–46.0)
Hemoglobin: 11.5 g/dL — ABNORMAL LOW (ref 12.0–15.0)
Immature Granulocytes: 0 %
Lymphocytes Relative: 24 %
Lymphs Abs: 1.2 10*3/uL (ref 0.7–4.0)
MCH: 30.7 pg (ref 26.0–34.0)
MCHC: 32.1 g/dL (ref 30.0–36.0)
MCV: 95.5 fL (ref 80.0–100.0)
Monocytes Absolute: 0.5 10*3/uL (ref 0.1–1.0)
Monocytes Relative: 11 %
Neutro Abs: 3.1 10*3/uL (ref 1.7–7.7)
Neutrophils Relative %: 64 %
Platelet Count: 146 10*3/uL — ABNORMAL LOW (ref 150–400)
RBC: 3.75 MIL/uL — ABNORMAL LOW (ref 3.87–5.11)
RDW: 13.3 % (ref 11.5–15.5)
WBC Count: 5 10*3/uL (ref 4.0–10.5)
nRBC: 0 % (ref 0.0–0.2)

## 2020-02-12 LAB — CMP (CANCER CENTER ONLY)
ALT: 20 U/L (ref 0–44)
AST: 26 U/L (ref 15–41)
Albumin: 3.9 g/dL (ref 3.5–5.0)
Alkaline Phosphatase: 80 U/L (ref 38–126)
Anion gap: 8 (ref 5–15)
BUN: 20 mg/dL (ref 8–23)
CO2: 25 mmol/L (ref 22–32)
Calcium: 9.9 mg/dL (ref 8.9–10.3)
Chloride: 106 mmol/L (ref 98–111)
Creatinine: 0.98 mg/dL (ref 0.44–1.00)
GFR, Est AFR Am: >60 mL/min (ref >60)
GFR, Estimated: 54 mL/min — ABNORMAL LOW (ref >60)
Glucose, Bld: 80 mg/dL (ref 70–99)
Potassium: 5 mmol/L (ref 3.5–5.1)
Sodium: 139 mmol/L (ref 135–145)
Total Bilirubin: 0.4 mg/dL (ref 0.3–1.2)
Total Protein: 7.3 g/dL (ref 6.5–8.1)

## 2020-02-12 MED ORDER — SODIUM CHLORIDE 0.9 % IV SOLN
200.0000 mg | Freq: Once | INTRAVENOUS | Status: AC
  Administered 2020-02-12: 200 mg via INTRAVENOUS
  Filled 2020-02-12: qty 8

## 2020-02-12 MED ORDER — SODIUM CHLORIDE 0.9% FLUSH
10.0000 mL | Freq: Once | INTRAVENOUS | Status: AC
  Administered 2020-02-12: 10 mL
  Filled 2020-02-12: qty 10

## 2020-02-12 MED ORDER — PROCHLORPERAZINE MALEATE 10 MG PO TABS
ORAL_TABLET | ORAL | Status: AC
  Filled 2020-02-12: qty 1

## 2020-02-12 MED ORDER — HEPARIN SOD (PORK) LOCK FLUSH 100 UNIT/ML IV SOLN
500.0000 [IU] | Freq: Once | INTRAVENOUS | Status: AC | PRN
  Administered 2020-02-12: 500 [IU]
  Filled 2020-02-12: qty 5

## 2020-02-12 MED ORDER — SODIUM CHLORIDE 0.9 % IV SOLN
Freq: Once | INTRAVENOUS | Status: AC
  Filled 2020-02-12: qty 250

## 2020-02-12 MED ORDER — SODIUM CHLORIDE 0.9% FLUSH
10.0000 mL | INTRAVENOUS | Status: DC | PRN
  Administered 2020-02-12: 10 mL
  Filled 2020-02-12: qty 10

## 2020-02-12 NOTE — Patient Instructions (Signed)

## 2020-02-12 NOTE — Telephone Encounter (Signed)
Scheduled per 7/14 los. Unable to reach pt. Left voicemail with appt times and dates.

## 2020-02-12 NOTE — Patient Instructions (Signed)
[facility] Discharge Instructions for Patients Receiving Chemotherapy  Today you received the following chemotherapy agents keytruda.  To help prevent nausea and vomiting after your treatment, we encourage you to take your nausea medication    If you develop nausea and vomiting that is not controlled by your nausea medication, call the clinic.   BELOW ARE SYMPTOMS THAT SHOULD BE REPORTED IMMEDIATELY:  *FEVER GREATER THAN 100.5 F  *CHILLS WITH OR WITHOUT FEVER  NAUSEA AND VOMITING THAT IS NOT CONTROLLED WITH YOUR NAUSEA MEDICATION  *UNUSUAL SHORTNESS OF BREATH  *UNUSUAL BRUISING OR BLEEDING  TENDERNESS IN MOUTH AND THROAT WITH OR WITHOUT PRESENCE OF ULCERS  *URINARY PROBLEMS  *BOWEL PROBLEMS  UNUSUAL RASH Items with * indicate a potential emergency and should be followed up as soon as possible.  Feel free to call the clinic should you have any questions or concerns. The clinic phone number is [phone].  Please show the [facility] at check-in to the Emergency Department and triage nurse.

## 2020-02-12 NOTE — Progress Notes (Signed)
[name] OFFICE PROGRESS NOTE   Diagnosis: Non-small cell lung cancer  INTERVAL HISTORY:   [name] returns as scheduled.  She completed another cycle of pembrolizumab 01/23/2020.  She denies nausea/vomiting.  No mouth sores.  No diarrhea.  No rash.  Skin over the lower legs continues to be improved.  Swelling also improved.  She has mild numbness/tingling in the toes and heels, none in the hands.  She describes her pain as "okay".  She thinks she may be having more pain across the upper back.  This was a chronic issue prior to her diagnosis.  Objective:  Vital signs in last 24 hours:  Blood pressure (!) 156/75, pulse (!) 59, temperature 97.9 F (36.6 C), temperature source Temporal, resp. rate 17, height '5\' 5"'  (1.651 m), weight 179 lb 11.2 oz (81.5 kg), SpO2 100 %.    HEENT: No thrush or ulcers. Resp: Lungs clear bilaterally. Cardio: Regular rate and rhythm. GI: Abdomen soft and nontender.  No hepatomegaly. Vascular: Very trace edema at the lower legs/ankles bilaterally. Skin: Hyperpigmentation at the lower legs bilaterally right greater than left. Port-A-Cath without erythema.  Lab Results:  Lab Results  Component Value Date   WBC 5.0 02/12/2020   HGB 11.5 (L) 02/12/2020   HCT 35.8 (L) 02/12/2020   MCV 95.5 02/12/2020   PLT 146 (L) 02/12/2020   NEUTROABS 3.1 02/12/2020    Imaging:  No results found.  Medications: I have reviewed the patient's current medications.  Assessment/Plan: 1. Non-small cell lung cancer  MRI lumbar spine 04/29/2019-enlarging marrow lesions involving the L1 vertebral body, upper left sacrum and right iliac bone  MRI pelvis 04/29/2019-3.5 cm left iliac bone lesion appears slightly larger; other similar appearing lesions present within the left superior pubic ramus, left superior abdomen acetabulum and upper left sacrum  Kappa free light chains with mild elevation 05/12/2019  CTs10/07/2019-left lower lobe pulmonary  mass 3.3 x 3.2 cm; lytic process left iliac bone; spinal lesions; 1.1 cm low-density left kidney lesion; right thyroid enlargement with heterogeneous appearance with potential for multiple discrete lesions  Biopsy left lower lobe lung mass 05/26/2019-poorly differentiated carcinoma; positive for cytokeratin 5/6, p63 and TTF-1, no EGFR, BRAF, ALK,ERBB2,ROS, orNTRKalteration  Cycle 1 carboplatin/Alimta/pembrolizumab 06/06/2019  Cycle 2 carboplatin/Alimta/pembrolizumab 06/27/2019  Cycle 3 carboplatin/Alimta/pembrolizumab 07/18/2019  Cycle 4 carboplatin/Alimta/pembrolizumab1/01/2020  CTs 08/27/2019-significant decrease in size of lobulated mass left lower lobe. Unchanged appearance of subtle bone lesions.  Cycle 5 Alimta/pembrolizumab 08/28/2019  Cycle 6 Alimta/pembrolizumab 09/18/2019  Cycle 7 Alimta/pembrolizumab 10/09/2019  Cycle 8 Alimta/pembrolizumab 10/30/2019  Cycle 9 Alimta/pembrolizumab 11/20/2019  CTs 12/09/2019-no evidence of disease progression, left lower lobe nodule slightly decreased in size, stable L1, left sacral, and left pubic ramus metastases  Cycle 10 Alimta/pembrolizumab 12/11/2019  Cycle 11 pembrolizumab alone 01/02/2020 (Alimta held due to edema, tenderness, erythema at the lower legs)  Cycle 12 pembrolizumab 01/23/2020  Cycle 13 pembrolizumab 02/12/2020 2. Pain secondary to #1 3. Chronic back pain 4. Type 2 diabetes 5. Essential hypertension 6. CAD 7. Hyperlipidemia 8. Family history significant for multiple members with breast cancer 9. Grade 1 skin rash 07/18/2019 likely related to immunotherapy. Topical steroid cream as needed. 10. E. coli urinary tract infection 07/14/2019. Completed cephalexin. 11. Edema/tenderness at the right greater than left ankle 10/21/2019-etiology unclear, potentially related to systemic therapy or an infection, doxycycline prescribed-improved 10/23/2019; marked improvement 11/20/2019; at office visit 01/02/2020 she reports worsening of  lower extremity edema, pain/tenderness, erythema 3 to 4 days following each treatment.  Alimta held  01/02/2020.  Referral to dermatology.   Disposition: [name] appears stable.  She is tolerating the pembrolizumab well.  Plan to proceed with treatment today as scheduled.  Alimta will remain on hold.  Restaging CTs after cycle 14.  The skin rash, edema and pain at the lower legs continues to be improved and was possibly related to Alimta which is now on hold.  We reviewed the CBC and chemistry panel from today.  Labs adequate to proceed with treatment as planned.  She will return for lab, follow-up, pembrolizumab in 3 weeks.   Plan reviewed with Dr. Benay Spice.    Ned Card ANP/GNP-BC   02/12/2020  9:40 AM

## 2020-02-29 ENCOUNTER — Other Ambulatory Visit: Payer: Self-pay | Admitting: Oncology

## 2020-03-04 ENCOUNTER — Inpatient Hospital Stay: Payer: Medicare Other

## 2020-03-04 ENCOUNTER — Encounter: Payer: Self-pay | Admitting: Nurse Practitioner

## 2020-03-04 ENCOUNTER — Other Ambulatory Visit: Payer: Self-pay

## 2020-03-04 ENCOUNTER — Inpatient Hospital Stay: Payer: Medicare Other | Attending: Oncology

## 2020-03-04 ENCOUNTER — Inpatient Hospital Stay (HOSPITAL_BASED_OUTPATIENT_CLINIC_OR_DEPARTMENT_OTHER): Payer: Medicare Other | Admitting: Nurse Practitioner

## 2020-03-04 VITALS — BP 159/70 | HR 52 | Temp 97.0°F | Resp 17 | Ht 65.0 in | Wt 180.0 lb

## 2020-03-04 VITALS — HR 57 | Temp 98.0°F

## 2020-03-04 DIAGNOSIS — Z95828 Presence of other vascular implants and grafts: Secondary | ICD-10-CM

## 2020-03-04 DIAGNOSIS — C3490 Malignant neoplasm of unspecified part of unspecified bronchus or lung: Secondary | ICD-10-CM | POA: Diagnosis not present

## 2020-03-04 DIAGNOSIS — C3492 Malignant neoplasm of unspecified part of left bronchus or lung: Secondary | ICD-10-CM

## 2020-03-04 DIAGNOSIS — C3432 Malignant neoplasm of lower lobe, left bronchus or lung: Secondary | ICD-10-CM | POA: Insufficient documentation

## 2020-03-04 DIAGNOSIS — Z5112 Encounter for antineoplastic immunotherapy: Secondary | ICD-10-CM | POA: Diagnosis present

## 2020-03-04 DIAGNOSIS — Z79899 Other long term (current) drug therapy: Secondary | ICD-10-CM | POA: Insufficient documentation

## 2020-03-04 DIAGNOSIS — Z23 Encounter for immunization: Secondary | ICD-10-CM | POA: Diagnosis not present

## 2020-03-04 LAB — CMP (CANCER CENTER ONLY)
ALT: 18 U/L (ref 0–44)
AST: 25 U/L (ref 15–41)
Albumin: 4 g/dL (ref 3.5–5.0)
Alkaline Phosphatase: 75 U/L (ref 38–126)
Anion gap: 6 (ref 5–15)
BUN: 21 mg/dL (ref 8–23)
CO2: 25 mmol/L (ref 22–32)
Calcium: 10.1 mg/dL (ref 8.9–10.3)
Chloride: 105 mmol/L (ref 98–111)
Creatinine: 0.9 mg/dL (ref 0.44–1.00)
GFR, Est AFR Am: >60 mL/min (ref >60)
GFR, Estimated: 60 mL/min — ABNORMAL LOW (ref >60)
Glucose, Bld: 97 mg/dL (ref 70–99)
Potassium: 5 mmol/L (ref 3.5–5.1)
Sodium: 136 mmol/L (ref 135–145)
Total Bilirubin: 0.4 mg/dL (ref 0.3–1.2)
Total Protein: 7.1 g/dL (ref 6.5–8.1)

## 2020-03-04 LAB — CBC WITH DIFFERENTIAL (CANCER CENTER ONLY)
Abs Immature Granulocytes: 0.01 10*3/uL (ref 0.00–0.07)
Basophils Absolute: 0 10*3/uL (ref 0.0–0.1)
Basophils Relative: 0 %
Eosinophils Absolute: 0.1 10*3/uL (ref 0.0–0.5)
Eosinophils Relative: 2 %
HCT: 35.5 % — ABNORMAL LOW (ref 36.0–46.0)
Hemoglobin: 11.4 g/dL — ABNORMAL LOW (ref 12.0–15.0)
Immature Granulocytes: 0 %
Lymphocytes Relative: 28 %
Lymphs Abs: 1.2 10*3/uL (ref 0.7–4.0)
MCH: 30.3 pg (ref 26.0–34.0)
MCHC: 32.1 g/dL (ref 30.0–36.0)
MCV: 94.4 fL (ref 80.0–100.0)
Monocytes Absolute: 0.5 10*3/uL (ref 0.1–1.0)
Monocytes Relative: 11 %
Neutro Abs: 2.6 10*3/uL (ref 1.7–7.7)
Neutrophils Relative %: 59 %
Platelet Count: 133 10*3/uL — ABNORMAL LOW (ref 150–400)
RBC: 3.76 MIL/uL — ABNORMAL LOW (ref 3.87–5.11)
RDW: 13.2 % (ref 11.5–15.5)
WBC Count: 4.4 10*3/uL (ref 4.0–10.5)
nRBC: 0 % (ref 0.0–0.2)

## 2020-03-04 LAB — TSH: TSH: 1.104 u[IU]/mL (ref 0.308–3.960)

## 2020-03-04 MED ORDER — HEPARIN SOD (PORK) LOCK FLUSH 100 UNIT/ML IV SOLN
500.0000 [IU] | Freq: Once | INTRAVENOUS | Status: AC | PRN
  Administered 2020-03-04: 500 [IU]
  Filled 2020-03-04: qty 5

## 2020-03-04 MED ORDER — LIDOCAINE-PRILOCAINE 2.5-2.5 % EX CREA: 1.0000 "application " | TOPICAL_CREAM | CUTANEOUS | 2 refills | Status: DC | PRN

## 2020-03-04 MED ORDER — SODIUM CHLORIDE 0.9% FLUSH
10.0000 mL | INTRAVENOUS | Status: DC | PRN
  Administered 2020-03-04: 10 mL
  Filled 2020-03-04: qty 10

## 2020-03-04 MED ORDER — SODIUM CHLORIDE 0.9 % IV SOLN
Freq: Once | INTRAVENOUS | Status: AC
  Filled 2020-03-04: qty 250

## 2020-03-04 MED ORDER — SODIUM CHLORIDE 0.9 % IV SOLN
200.0000 mg | Freq: Once | INTRAVENOUS | Status: AC
  Administered 2020-03-04: 200 mg via INTRAVENOUS
  Filled 2020-03-04: qty 8

## 2020-03-04 MED ORDER — SODIUM CHLORIDE 0.9% FLUSH
10.0000 mL | Freq: Once | INTRAVENOUS | Status: AC
  Administered 2020-03-04: 10 mL
  Filled 2020-03-04: qty 10

## 2020-03-04 NOTE — Patient Instructions (Signed)

## 2020-03-04 NOTE — Patient Instructions (Signed)
[place] Discharge Instructions for Patients Receiving Chemotherapy  Today you received the following chemotherapy agents Pembrolizumab [facility]).  To help prevent nausea and vomiting after your treatment, we encourage you to take your nausea medication as prescribed.   If you develop nausea and vomiting that is not controlled by your nausea medication, call the clinic.   BELOW ARE SYMPTOMS THAT SHOULD BE REPORTED IMMEDIATELY:  *FEVER GREATER THAN 100.5 F  *CHILLS WITH OR WITHOUT FEVER  NAUSEA AND VOMITING THAT IS NOT CONTROLLED WITH YOUR NAUSEA MEDICATION  *UNUSUAL SHORTNESS OF BREATH  *UNUSUAL BRUISING OR BLEEDING  TENDERNESS IN MOUTH AND THROAT WITH OR WITHOUT PRESENCE OF ULCERS  *URINARY PROBLEMS  *BOWEL PROBLEMS  UNUSUAL RASH Items with * indicate a potential emergency and should be followed up as soon as possible.  Feel free to call the clinic should you have any questions or concerns. The clinic phone number is [phone].  Please show the [facility] at check-in to the Emergency Department and triage nurse.

## 2020-03-04 NOTE — Progress Notes (Signed)
[facility] OFFICE PROGRESS NOTE   Diagnosis: Non-small cell lung cancer  INTERVAL HISTORY:   [name] returns as scheduled.  She completed another cycle of Pembrolizumab 02/12/2020.  No diarrhea or rash.  She has a good appetite.  Leg pain/skin changes continue to be improved though she does note some tenderness.  She reports more pain at the left back and left hip.  She has not tried Ultram.  She reports intermittent dizziness.  She mainly notes this when she lays down at night.  No unusual headaches or vision change.  She denies previous vertigo.  Objective:  Vital signs in last 24 hours:  Blood pressure (!) 159/70, pulse (!) 52, temperature (!) 97 F (36.1 C), temperature source Temporal, resp. rate 17, height 5' 5" (1.651 m), weight 180 lb (81.6 kg), SpO2 98 %.    HEENT: No thrush or ulcers. Resp: Lungs clear bilaterally. Cardio: Regular rate and rhythm. GI: Abdomen soft and nontender.  No hepatomegaly. Vascular: Trace edema at the lower legs bilaterally.  Neuro: Alert and oriented.  Follows commands.  Motor strength 5/5.  Finger-to-nose and rapid alternating movement intact. Skin: Hyperpigmentation at the lower legs bilaterally right greater than left. Port-A-Cath without erythema.   Lab Results:  Lab Results  Component Value Date   WBC 4.4 03/04/2020   HGB 11.4 (L) 03/04/2020   HCT 35.5 (L) 03/04/2020   MCV 94.4 03/04/2020   PLT 133 (L) 03/04/2020   NEUTROABS 2.6 03/04/2020    Imaging:  No results found.  Medications: I have reviewed the patient's current medications.  Assessment/Plan: 1. Non-small cell lung cancer  MRI lumbar spine 04/29/2019-enlarging marrow lesions involving the L1 vertebral body, upper left sacrum and right iliac bone  MRI pelvis 04/29/2019-3.5 cm left iliac bone lesion appears slightly larger; other similar appearing lesions present within the left superior pubic ramus, left superior abdomen acetabulum and upper left  sacrum  Kappa free light chains with mild elevation 05/12/2019  CTs10/07/2019-left lower lobe pulmonary mass 3.3 x 3.2 cm; lytic process left iliac bone; spinal lesions; 1.1 cm low-density left kidney lesion; right thyroid enlargement with heterogeneous appearance with potential for multiple discrete lesions  Biopsy left lower lobe lung mass 05/26/2019-poorly differentiated carcinoma; positive for cytokeratin 5/6, p63 and TTF-1, no EGFR, BRAF, ALK,ERBB2,ROS, orNTRKalteration  Cycle 1 carboplatin/Alimta/pembrolizumab 06/06/2019  Cycle 2 carboplatin/Alimta/pembrolizumab 06/27/2019  Cycle 3 carboplatin/Alimta/pembrolizumab 07/18/2019  Cycle 4 carboplatin/Alimta/pembrolizumab1/01/2020  CTs 08/27/2019-significant decrease in size of lobulated mass left lower lobe. Unchanged appearance of subtle bone lesions.  Cycle 5 Alimta/pembrolizumab 08/28/2019  Cycle 6 Alimta/pembrolizumab 09/18/2019  Cycle 7 Alimta/pembrolizumab 10/09/2019  Cycle 8 Alimta/pembrolizumab 10/30/2019  Cycle 9 Alimta/pembrolizumab 11/20/2019  CTs 12/09/2019-no evidence of disease progression, left lower lobe nodule slightly decreased in size, stable L1, left sacral, and left pubic ramus metastases  Cycle 10 Alimta/pembrolizumab 12/11/2019  Cycle 11 pembrolizumab alone 01/02/2020 (Alimta held due to edema, tenderness, erythema at the lower legs)  Cycle 12 pembrolizumab 01/23/2020  Cycle 13 pembrolizumab 02/12/2020  Cycle 14 pembrolizumab 03/04/2020 2. Pain secondary to #1 3. Chronic back pain 4. Type 2 diabetes 5. Essential hypertension 6. CAD 7. Hyperlipidemia 8. Family history significant for multiple members with breast cancer 9. Grade 1 skin rash 07/18/2019 likely related to immunotherapy. Topical steroid cream as needed. 10. E. coli urinary tract infection 07/14/2019. Completed cephalexin. 11. Edema/tenderness at the right greater than left ankle 10/21/2019-etiology unclear, potentially related to systemic  therapy or an infection, doxycycline prescribed-improved 10/23/2019; marked improvement 11/20/2019; at   office visit 01/02/2020 she reports worsening of lower extremity edema, pain/tenderness, erythema 3 to 4 days following each treatment. Alimta held 01/02/2020. Referral to dermatology.   Disposition: [name] appears unchanged.  She has completed 13 cycles of pembrolizumab.  Plan to proceed with cycle 14 today as scheduled.  Alimta remains on hold.  She will undergo restaging CT scans prior to her next visit.  We will also obtain a brain CT due to dizziness.  We reviewed the CBC and chemistry panel from today.  Labs adequate to proceed with treatment.  She will return for lab, follow-up, pembrolizumab in 3 weeks.  She will contact the office in the interim with any problems.  We specifically discussed increased pain, dizziness or other worrisome symptoms.    [name] ANP/GNP-BC   03/04/2020  11:02 AM        

## 2020-03-05 ENCOUNTER — Telehealth: Payer: Self-pay | Admitting: Oncology

## 2020-03-05 NOTE — Telephone Encounter (Signed)
Scheduled per 08/05 los, patient has been called and voicemail was left.

## 2020-03-18 ENCOUNTER — Encounter: Payer: Self-pay | Admitting: *Deleted

## 2020-03-18 ENCOUNTER — Ambulatory Visit (HOSPITAL_COMMUNITY)
Admission: RE | Admit: 2020-03-18 | Discharge: 2020-03-18 | Disposition: A | Discharge status: Home / Self Care | Payer: Medicare Other | Source: Ambulatory Visit | Attending: Nurse Practitioner | Admitting: Nurse Practitioner

## 2020-03-18 ENCOUNTER — Other Ambulatory Visit: Payer: Self-pay

## 2020-03-18 DIAGNOSIS — C3492 Malignant neoplasm of unspecified part of left bronchus or lung: Secondary | ICD-10-CM | POA: Diagnosis present

## 2020-03-18 DIAGNOSIS — C3490 Malignant neoplasm of unspecified part of unspecified bronchus or lung: Secondary | ICD-10-CM | POA: Diagnosis present

## 2020-03-18 MED ORDER — HEPARIN SOD (PORK) LOCK FLUSH 100 UNIT/ML IV SOLN
INTRAVENOUS | Status: AC
  Filled 2020-03-18: qty 5

## 2020-03-18 MED ORDER — SODIUM CHLORIDE (PF) 0.9 % IJ SOLN
INTRAMUSCULAR | Status: AC
  Filled 2020-03-18: qty 50

## 2020-03-18 MED ORDER — IOHEXOL 300 MG/ML  SOLN
100.0000 mL | Freq: Once | INTRAMUSCULAR | Status: AC | PRN
  Administered 2020-03-18: 100 mL via INTRAVENOUS

## 2020-03-21 ENCOUNTER — Other Ambulatory Visit: Payer: Self-pay | Admitting: Oncology

## 2020-03-25 ENCOUNTER — Other Ambulatory Visit: Payer: Self-pay

## 2020-03-25 ENCOUNTER — Inpatient Hospital Stay: Payer: Medicare Other

## 2020-03-25 ENCOUNTER — Encounter: Payer: Self-pay | Admitting: Oncology

## 2020-03-25 ENCOUNTER — Inpatient Hospital Stay (HOSPITAL_BASED_OUTPATIENT_CLINIC_OR_DEPARTMENT_OTHER): Payer: Medicare Other | Admitting: Oncology

## 2020-03-25 VITALS — BP 145/65 | HR 56 | Temp 97.5°F | Resp 17 | Ht 65.0 in | Wt 182.8 lb

## 2020-03-25 DIAGNOSIS — Z95828 Presence of other vascular implants and grafts: Secondary | ICD-10-CM

## 2020-03-25 DIAGNOSIS — C3492 Malignant neoplasm of unspecified part of left bronchus or lung: Secondary | ICD-10-CM

## 2020-03-25 DIAGNOSIS — Z5112 Encounter for antineoplastic immunotherapy: Secondary | ICD-10-CM | POA: Diagnosis not present

## 2020-03-25 DIAGNOSIS — C3490 Malignant neoplasm of unspecified part of unspecified bronchus or lung: Secondary | ICD-10-CM | POA: Diagnosis not present

## 2020-03-25 DIAGNOSIS — Z23 Encounter for immunization: Secondary | ICD-10-CM

## 2020-03-25 LAB — CBC WITH DIFFERENTIAL (CANCER CENTER ONLY)
Abs Immature Granulocytes: 0.01 10*3/uL (ref 0.00–0.07)
Basophils Absolute: 0 10*3/uL (ref 0.0–0.1)
Basophils Relative: 1 %
Eosinophils Absolute: 0.1 10*3/uL (ref 0.0–0.5)
Eosinophils Relative: 2 %
HCT: 34.9 % — ABNORMAL LOW (ref 36.0–46.0)
Hemoglobin: 11.1 g/dL — ABNORMAL LOW (ref 12.0–15.0)
Immature Granulocytes: 0 %
Lymphocytes Relative: 30 %
Lymphs Abs: 1.3 10*3/uL (ref 0.7–4.0)
MCH: 30.2 pg (ref 26.0–34.0)
MCHC: 31.8 g/dL (ref 30.0–36.0)
MCV: 94.8 fL (ref 80.0–100.0)
Monocytes Absolute: 0.4 10*3/uL (ref 0.1–1.0)
Monocytes Relative: 9 %
Neutro Abs: 2.6 10*3/uL (ref 1.7–7.7)
Neutrophils Relative %: 58 %
Platelet Count: 135 10*3/uL — ABNORMAL LOW (ref 150–400)
RBC: 3.68 MIL/uL — ABNORMAL LOW (ref 3.87–5.11)
RDW: 13.2 % (ref 11.5–15.5)
WBC Count: 4.4 10*3/uL (ref 4.0–10.5)
nRBC: 0 % (ref 0.0–0.2)

## 2020-03-25 LAB — CMP (CANCER CENTER ONLY)
ALT: 14 U/L (ref 0–44)
AST: 20 U/L (ref 15–41)
Albumin: 3.7 g/dL (ref 3.5–5.0)
Alkaline Phosphatase: 83 U/L (ref 38–126)
Anion gap: 4 — ABNORMAL LOW (ref 5–15)
BUN: 22 mg/dL (ref 8–23)
CO2: 27 mmol/L (ref 22–32)
Calcium: 9.9 mg/dL (ref 8.9–10.3)
Chloride: 105 mmol/L (ref 98–111)
Creatinine: 0.99 mg/dL (ref 0.44–1.00)
GFR, Est AFR Am: >60 mL/min (ref >60)
GFR, Estimated: 53 mL/min — ABNORMAL LOW (ref >60)
Glucose, Bld: 117 mg/dL — ABNORMAL HIGH (ref 70–99)
Potassium: 4.8 mmol/L (ref 3.5–5.1)
Sodium: 136 mmol/L (ref 135–145)
Total Bilirubin: 0.3 mg/dL (ref 0.3–1.2)
Total Protein: 6.8 g/dL (ref 6.5–8.1)

## 2020-03-25 MED ORDER — SODIUM CHLORIDE 0.9% FLUSH
10.0000 mL | Freq: Once | INTRAVENOUS | Status: AC
  Administered 2020-03-25: 10 mL
  Filled 2020-03-25: qty 10

## 2020-03-25 MED ORDER — SODIUM CHLORIDE 0.9% FLUSH
10.0000 mL | INTRAVENOUS | Status: DC | PRN
  Administered 2020-03-25: 10 mL
  Filled 2020-03-25: qty 10

## 2020-03-25 MED ORDER — SODIUM CHLORIDE 0.9 % IV SOLN
200.0000 mg | Freq: Once | INTRAVENOUS | Status: AC
  Administered 2020-03-25: 200 mg via INTRAVENOUS
  Filled 2020-03-25: qty 8

## 2020-03-25 MED ORDER — SODIUM CHLORIDE 0.9 % IV SOLN
Freq: Once | INTRAVENOUS | Status: AC
  Filled 2020-03-25: qty 250

## 2020-03-25 MED ORDER — HEPARIN SOD (PORK) LOCK FLUSH 100 UNIT/ML IV SOLN
500.0000 [IU] | Freq: Once | INTRAVENOUS | Status: AC | PRN
  Administered 2020-03-25: 500 [IU]
  Filled 2020-03-25: qty 5

## 2020-03-25 NOTE — Progress Notes (Signed)
[facility] OFFICE PROGRESS NOTE  Diagnosis: Non-small cell lung cancer  INTERVAL HISTORY:   [name] returns as scheduled. She continues pembrolizumab. No rash or diarrhea. She reports increased back pain and numbness in the right leg. She relates this to chronic back pain that predated the lung cancer diagnosis. She plans to see Dr. Ronnald Ramp to consider "injection "therapy. She takes Tylenol and Aleve as needed.  Objective:  Vital signs in last 24 hours:  Blood pressure (!) 145/65, pulse (!) 56, temperature (!) 97.5 F (36.4 C), temperature source Axillary, resp. rate 17, height 5' 5" (1.651 m), weight 182 lb 12.8 oz (82.9 kg), SpO2 95 %.  Resp: Lungs clear bilaterally Cardio: Regular rate and rhythm GI: No hepatomegaly, nontender Vascular: No leg edema  Skin: Mild hyperpigmentation of the lower legs, no rash Musculoskeletal: No tenderness at the sacrum  Portacath/PICC-without erythema  Lab Results:  Lab Results  Component Value Date   WBC 4.4 03/25/2020   HGB 11.1 (L) 03/25/2020   HCT 34.9 (L) 03/25/2020   MCV 94.8 03/25/2020   PLT 135 (L) 03/25/2020   NEUTROABS 2.6 03/25/2020    CMP  Lab Results  Component Value Date   NA 136 03/04/2020   K 5.0 03/04/2020   CL 105 03/04/2020   CO2 25 03/04/2020   GLUCOSE 97 03/04/2020   BUN 21 03/04/2020   CREATININE 0.90 03/04/2020   CALCIUM 10.1 03/04/2020   PROT 7.1 03/04/2020   ALBUMIN 4.0 03/04/2020   AST 25 03/04/2020   ALT 18 03/04/2020   ALKPHOS 75 03/04/2020   BILITOT 0.4 03/04/2020   GFRNONAA 60 (L) 03/04/2020   GFRAA >60 03/04/2020    Medications: I have reviewed the patient's current medications.   Assessment/Plan:  1. Non-small cell lung cancer  MRI lumbar spine 04/29/2019-enlarging marrow lesions involving the L1 vertebral body, upper left sacrum and right iliac bone  MRI pelvis 04/29/2019-3.5 cm left iliac bone lesion appears slightly larger; other similar appearing lesions present  within the left superior pubic ramus, left superior abdomen acetabulum and upper left sacrum  Kappa free light chains with mild elevation 05/12/2019  CTs10/07/2019-left lower lobe pulmonary mass 3.3 x 3.2 cm; lytic process left iliac bone; spinal lesions; 1.1 cm low-density left kidney lesion; right thyroid enlargement with heterogeneous appearance with potential for multiple discrete lesions  Biopsy left lower lobe lung mass 05/26/2019-poorly differentiated carcinoma; positive for cytokeratin 5/6, p63 and TTF-1, no EGFR, BRAF, ALK,ERBB2,ROS, orNTRKalteration  Cycle 1 carboplatin/Alimta/pembrolizumab 06/06/2019  Cycle 2 carboplatin/Alimta/pembrolizumab 06/27/2019  Cycle 3 carboplatin/Alimta/pembrolizumab 07/18/2019  Cycle 4 carboplatin/Alimta/pembrolizumab1/01/2020  CTs 08/27/2019-significant decrease in size of lobulated mass left lower lobe. Unchanged appearance of subtle bone lesions.  Cycle 5 Alimta/pembrolizumab 08/28/2019  Cycle 6 Alimta/pembrolizumab 09/18/2019  Cycle 7 Alimta/pembrolizumab 10/09/2019  Cycle 8 Alimta/pembrolizumab 10/30/2019  Cycle 9 Alimta/pembrolizumab 11/20/2019  CTs 12/09/2019-no evidence of disease progression, left lower lobe nodule slightly decreased in size, stable L1, left sacral, and left pubic ramus metastases  Cycle 10 Alimta/pembrolizumab 12/11/2019  Cycle 11 pembrolizumab alone 01/02/2020 (Alimta held due to edema, tenderness, erythema at the lower legs)  Cycle 12 pembrolizumab 01/23/2020  Cycle 13 pembrolizumab 02/12/2020  Cycle 14 pembrolizumab 03/04/2020  CTs 03/18/2020-stable left lower lobe lesion, mild sclerosis at the superior endplate of L1 that was previously hypermetabolic, stable small left upper sacral lucent lesion, previous left superior pubic ramus lesion is occult on the CT, CT head negative for malignancy  Cycle 15 pembrolizumab 03/25/2020 2. Pain secondary to #1 3.  Chronic back pain 4. Type 2 diabetes 5. Essential  hypertension 6. CAD 7. Hyperlipidemia 8. Family history significant for multiple members with breast cancer 9. Grade 1 skin rash 07/18/2019 likely related to immunotherapy. Topical steroid cream as needed. 10. E. coli urinary tract infection 07/14/2019. Completed cephalexin. 11. Edema/tenderness at the right greater than left ankle 10/21/2019-etiology unclear, potentially related to systemic therapy or an infection, doxycycline prescribed-improved 10/23/2019; marked improvement 11/20/2019; at office visit 01/02/2020 she reports worsening of lower extremity edema, pain/tenderness, erythema 3 to 4 days following each treatment. Alimta held 01/02/2020. Referral to dermatology.    Disposition: [name] appears stable. The restaging CTs show a stable left lung mass and no evidence of disease progression. The plan is to continue pembrolizumab. She will complete another cycle today. [name] will return for an office visit and pembrolizumab in 3 weeks.  She has chronic back pain, likely related to benign musculoskeletal disease. However it is possible the back pain could be in part related to metastatic lung cancer. She plans to see Dr. Jones to consider injection therapy.  [name] received a COVID-19 booster vaccine today.  Gary Sherrill, MD  03/25/2020  10:22 AM   

## 2020-03-25 NOTE — Patient Instructions (Signed)
[facility] Discharge Instructions for Patients Receiving Chemotherapy  Today you received the following chemotherapy agents Pembrolizumab [facility]).  To help prevent nausea and vomiting after your treatment, we encourage you to take your nausea medication as prescribed.   If you develop nausea and vomiting that is not controlled by your nausea medication, call the clinic.   BELOW ARE SYMPTOMS THAT SHOULD BE REPORTED IMMEDIATELY:  *FEVER GREATER THAN 100.5 F  *CHILLS WITH OR WITHOUT FEVER  NAUSEA AND VOMITING THAT IS NOT CONTROLLED WITH YOUR NAUSEA MEDICATION  *UNUSUAL SHORTNESS OF BREATH  *UNUSUAL BRUISING OR BLEEDING  TENDERNESS IN MOUTH AND THROAT WITH OR WITHOUT PRESENCE OF ULCERS  *URINARY PROBLEMS  *BOWEL PROBLEMS  UNUSUAL RASH Items with * indicate a potential emergency and should be followed up as soon as possible.  Feel free to call the clinic should you have any questions or concerns. The clinic phone number is [phone].  Please show the [facility] at check-in to the Emergency Department and triage nurse.

## 2020-03-26 ENCOUNTER — Telehealth: Payer: Self-pay | Admitting: Oncology

## 2020-03-26 NOTE — Telephone Encounter (Signed)
Scheduled appointments per 8/26 los. Left message for patient with appointment date and time.

## 2020-03-29 ENCOUNTER — Encounter: Payer: Self-pay | Admitting: Oncology

## 2020-03-30 ENCOUNTER — Telehealth: Payer: Self-pay | Admitting: *Deleted

## 2020-03-30 ENCOUNTER — Ambulatory Visit: Payer: Medicare Other

## 2020-03-30 NOTE — Telephone Encounter (Signed)
Called to report that over weekend she developed aching in muscles as if she had the flu. Had a low grade fever < 100. Head ache and dry cough. Asking if it could be related to booster vaccine she had on 03/25/20? She reports she did have a significant reaction to her 2nd vaccine about 4 days later. Confirmed her it is most likely side effect of vaccine. Take Tylenol, push fluids and OTC cough suppressant. Call if she develops high fever or has shortness of breath.

## 2020-04-02 ENCOUNTER — Telehealth: Payer: Self-pay | Admitting: *Deleted

## 2020-04-02 NOTE — Telephone Encounter (Signed)
Called to report she is still "feeling bad". Has frontal headache that will ease for awhile and then come back. Taking Tylenol prn and OTC sinus decongestant. She denies any dizziness, visual disturbances, confusion or balance issues. No fever. Instructed to reach out to her PCP about this issue. Had CT head 2 weeks ago that was negative.

## 2020-04-05 ENCOUNTER — Encounter: Payer: Self-pay | Admitting: Oncology

## 2020-04-06 ENCOUNTER — Ambulatory Visit (HOSPITAL_COMMUNITY)
Admission: RE | Admit: 2020-04-06 | Discharge: 2020-04-06 | Disposition: A | Discharge status: Home / Self Care | Payer: Medicare Other | Source: Ambulatory Visit | Attending: Pulmonary Disease | Admitting: Pulmonary Disease

## 2020-04-06 ENCOUNTER — Encounter: Payer: Self-pay | Admitting: *Deleted

## 2020-04-06 ENCOUNTER — Other Ambulatory Visit: Payer: Self-pay | Admitting: Physician Assistant

## 2020-04-06 ENCOUNTER — Telehealth: Payer: Self-pay | Admitting: Oncology

## 2020-04-06 ENCOUNTER — Other Ambulatory Visit: Payer: Self-pay | Admitting: *Deleted

## 2020-04-06 DIAGNOSIS — U071 COVID-19: Secondary | ICD-10-CM | POA: Diagnosis present

## 2020-04-06 DIAGNOSIS — I358 Other nonrheumatic aortic valve disorders: Secondary | ICD-10-CM

## 2020-04-06 DIAGNOSIS — I10 Essential (primary) hypertension: Secondary | ICD-10-CM

## 2020-04-06 DIAGNOSIS — Z23 Encounter for immunization: Secondary | ICD-10-CM | POA: Diagnosis not present

## 2020-04-06 DIAGNOSIS — I5189 Other ill-defined heart diseases: Secondary | ICD-10-CM

## 2020-04-06 DIAGNOSIS — I2510 Atherosclerotic heart disease of native coronary artery without angina pectoris: Secondary | ICD-10-CM

## 2020-04-06 DIAGNOSIS — C3492 Malignant neoplasm of unspecified part of left bronchus or lung: Secondary | ICD-10-CM

## 2020-04-06 MED ORDER — FAMOTIDINE IN NACL 20-0.9 MG/50ML-% IV SOLN: 20.0000 mg | Freq: Once | INTRAVENOUS | Status: DC | PRN

## 2020-04-06 MED ORDER — DIPHENHYDRAMINE HCL 50 MG/ML IJ SOLN: 50.0000 mg | Freq: Once | INTRAMUSCULAR | Status: DC | PRN

## 2020-04-06 MED ORDER — SODIUM CHLORIDE 0.9 % IV SOLN: INTRAVENOUS | Status: DC | PRN

## 2020-04-06 MED ORDER — SODIUM CHLORIDE 0.9 % IV SOLN
1200.0000 mg | Freq: Once | INTRAVENOUS | Status: AC
  Administered 2020-04-06: 1200 mg via INTRAVENOUS

## 2020-04-06 MED ORDER — EPINEPHRINE 0.3 MG/0.3ML IJ SOAJ: 0.3000 mg | Freq: Once | INTRAMUSCULAR | Status: DC | PRN

## 2020-04-06 MED ORDER — ALBUTEROL SULFATE HFA 108 (90 BASE) MCG/ACT IN AERS: 2.0000 | INHALATION_SPRAY | Freq: Once | RESPIRATORY_TRACT | Status: DC | PRN

## 2020-04-06 MED ORDER — METHYLPREDNISOLONE SODIUM SUCC 125 MG IJ SOLR: 125.0000 mg | Freq: Once | INTRAMUSCULAR | Status: DC | PRN

## 2020-04-06 NOTE — Progress Notes (Signed)
I connected by phone with [name] on 04/06/2020 at 2:16 PM to discuss the potential use of a new treatment for mild to moderate COVID-19 viral infection in non-hospitalized patients.  This patient is a 85 y.o. female that meets the FDA criteria for Emergency Use Authorization of COVID monoclonal antibody casirivimab/imdevimab.  Has a (+) direct SARS-CoV-2 viral test result  Has mild or moderate COVID-19   Is NOT hospitalized due to COVID-19  Is within 10 days of symptom onset  Has at least one of the high risk factor(s) for progression to severe COVID-19 and/or hospitalization as defined in EUA.  Specific high risk criteria : Older age (>/= 83 yo), Immunosuppressive Disease or Treatment, Cardiovascular disease or hypertension and Chronic Lung Disease   I have spoken and communicated the following to the patient or parent/caregiver regarding COVID monoclonal antibody treatment:  1. FDA has authorized the emergency use for the treatment of mild to moderate COVID-19 in adults and pediatric patients with positive results of direct SARS-CoV-2 viral testing who are 35 years of age and older weighing at least 40 kg, and who are at high risk for progressing to severe COVID-19 and/or hospitalization.  2. The significant known and potential risks and benefits of COVID monoclonal antibody, and the extent to which such potential risks and benefits are unknown.  3. Information on available alternative treatments and the risks and benefits of those alternatives, including clinical trials.  4. Patients treated with COVID monoclonal antibody should continue to self-isolate and use infection control measures (e.g., wear mask, isolate, social distance, avoid sharing personal items, clean and disinfect "high touch" surfaces, and frequent handwashing) according to CDC guidelines.   5. The patient or parent/caregiver has the option to accept or refuse COVID monoclonal antibody treatment.  After  reviewing this information with the patient, The patient agreed to proceed with receiving casirivimab\imdevimab infusion and will be provided a copy of the Fact sheet prior to receiving the infusion.    [name] 04/06/2020 2:16 PM

## 2020-04-06 NOTE — Discharge Instructions (Signed)

## 2020-04-06 NOTE — Progress Notes (Signed)
  Diagnosis: COVID-19  Physician: Dr. Asencion Noble  Procedure: Covid Infusion Clinic Med: casirivimab\imdevimab infusion - Provided patient with casirivimab\imdevimab fact sheet for patients, parents and caregivers prior to infusion.  Complications: No immediate complications noted.  Discharge: Discharged home   Sanjuana Mae 04/06/2020

## 2020-04-06 NOTE — Progress Notes (Signed)
Wife reports they have been COVID tested due to an exposure--results are pending. She is now on prednisone 10 mg/day per PCP for her body aches and headache last week.

## 2020-04-06 NOTE — Telephone Encounter (Signed)
R/s appt per 9/7 shc msg - pt is aware of new appt on 9/22

## 2020-04-15 ENCOUNTER — Ambulatory Visit: Payer: Medicare Other | Admitting: Oncology

## 2020-04-15 ENCOUNTER — Ambulatory Visit: Payer: Medicare Other

## 2020-04-15 ENCOUNTER — Other Ambulatory Visit: Payer: Medicare Other

## 2020-04-18 ENCOUNTER — Other Ambulatory Visit: Payer: Self-pay | Admitting: Oncology

## 2020-04-21 ENCOUNTER — Inpatient Hospital Stay (HOSPITAL_BASED_OUTPATIENT_CLINIC_OR_DEPARTMENT_OTHER): Payer: Medicare Other | Admitting: Oncology

## 2020-04-21 ENCOUNTER — Other Ambulatory Visit: Payer: Self-pay | Admitting: Oncology

## 2020-04-21 ENCOUNTER — Other Ambulatory Visit: Payer: Self-pay

## 2020-04-21 ENCOUNTER — Inpatient Hospital Stay: Payer: Medicare Other | Attending: Oncology

## 2020-04-21 ENCOUNTER — Inpatient Hospital Stay: Payer: Medicare Other

## 2020-04-21 VITALS — BP 156/74 | HR 60 | Temp 98.1°F | Resp 20 | Ht 65.0 in | Wt 180.4 lb

## 2020-04-21 DIAGNOSIS — C3492 Malignant neoplasm of unspecified part of left bronchus or lung: Secondary | ICD-10-CM

## 2020-04-21 DIAGNOSIS — Z5112 Encounter for antineoplastic immunotherapy: Secondary | ICD-10-CM | POA: Diagnosis present

## 2020-04-21 DIAGNOSIS — Z79899 Other long term (current) drug therapy: Secondary | ICD-10-CM | POA: Insufficient documentation

## 2020-04-21 DIAGNOSIS — C3432 Malignant neoplasm of lower lobe, left bronchus or lung: Secondary | ICD-10-CM | POA: Diagnosis present

## 2020-04-21 DIAGNOSIS — C3490 Malignant neoplasm of unspecified part of unspecified bronchus or lung: Secondary | ICD-10-CM

## 2020-04-21 DIAGNOSIS — Z95828 Presence of other vascular implants and grafts: Secondary | ICD-10-CM

## 2020-04-21 LAB — CBC WITH DIFFERENTIAL (CANCER CENTER ONLY)
Abs Immature Granulocytes: 0.01 10*3/uL (ref 0.00–0.07)
Basophils Absolute: 0 10*3/uL (ref 0.0–0.1)
Basophils Relative: 0 %
Eosinophils Absolute: 0.1 10*3/uL (ref 0.0–0.5)
Eosinophils Relative: 3 %
HCT: 35.4 % — ABNORMAL LOW (ref 36.0–46.0)
Hemoglobin: 11.5 g/dL — ABNORMAL LOW (ref 12.0–15.0)
Immature Granulocytes: 0 %
Lymphocytes Relative: 23 %
Lymphs Abs: 0.9 10*3/uL (ref 0.7–4.0)
MCH: 30.3 pg (ref 26.0–34.0)
MCHC: 32.5 g/dL (ref 30.0–36.0)
MCV: 93.4 fL (ref 80.0–100.0)
Monocytes Absolute: 0.5 10*3/uL (ref 0.1–1.0)
Monocytes Relative: 13 %
Neutro Abs: 2.4 10*3/uL (ref 1.7–7.7)
Neutrophils Relative %: 61 %
Platelet Count: 123 10*3/uL — ABNORMAL LOW (ref 150–400)
RBC: 3.79 MIL/uL — ABNORMAL LOW (ref 3.87–5.11)
RDW: 13.6 % (ref 11.5–15.5)
WBC Count: 3.9 10*3/uL — ABNORMAL LOW (ref 4.0–10.5)
nRBC: 0 % (ref 0.0–0.2)

## 2020-04-21 LAB — CMP (CANCER CENTER ONLY)
ALT: 21 U/L (ref 0–44)
AST: 26 U/L (ref 15–41)
Albumin: 3.7 g/dL (ref 3.5–5.0)
Alkaline Phosphatase: 87 U/L (ref 38–126)
Anion gap: 6 (ref 5–15)
BUN: 21 mg/dL (ref 8–23)
CO2: 28 mmol/L (ref 22–32)
Calcium: 9.8 mg/dL (ref 8.9–10.3)
Chloride: 105 mmol/L (ref 98–111)
Creatinine: 0.85 mg/dL (ref 0.44–1.00)
GFR, Est AFR Am: >60 mL/min (ref >60)
GFR, Estimated: >60 mL/min (ref >60)
Glucose, Bld: 93 mg/dL (ref 70–99)
Potassium: 4.7 mmol/L (ref 3.5–5.1)
Sodium: 139 mmol/L (ref 135–145)
Total Bilirubin: 0.3 mg/dL (ref 0.3–1.2)
Total Protein: 7.4 g/dL (ref 6.5–8.1)

## 2020-04-21 LAB — TSH: TSH: 0.681 u[IU]/mL (ref 0.308–3.960)

## 2020-04-21 MED ORDER — SODIUM CHLORIDE 0.9 % IV SOLN
Freq: Once | INTRAVENOUS | Status: AC
  Filled 2020-04-21: qty 250

## 2020-04-21 MED ORDER — SODIUM CHLORIDE 0.9% FLUSH
10.0000 mL | INTRAVENOUS | Status: DC | PRN
  Administered 2020-04-21: 10 mL
  Filled 2020-04-21: qty 10

## 2020-04-21 MED ORDER — HEPARIN SOD (PORK) LOCK FLUSH 100 UNIT/ML IV SOLN
500.0000 [IU] | Freq: Once | INTRAVENOUS | Status: AC | PRN
  Administered 2020-04-21: 500 [IU]
  Filled 2020-04-21: qty 5

## 2020-04-21 MED ORDER — SODIUM CHLORIDE 0.9% FLUSH
10.0000 mL | Freq: Once | INTRAVENOUS | Status: AC
  Administered 2020-04-21: 10 mL
  Filled 2020-04-21: qty 10

## 2020-04-21 MED ORDER — SODIUM CHLORIDE 0.9 % IV SOLN
200.0000 mg | Freq: Once | INTRAVENOUS | Status: AC
  Administered 2020-04-21: 200 mg via INTRAVENOUS
  Filled 2020-04-21: qty 8

## 2020-04-21 NOTE — Progress Notes (Signed)
[facility] OFFICE PROGRESS NOTE   Diagnosis: Non-small cell lung cancer  INTERVAL HISTORY:   Ms. Iannello was last treated with pembrolizumab on 03/25/2020.  She received the Covid booster vaccine the same day.  She developed a low-grade fever, headache, cough, and myalgias beginning 03/30/2020.  She felt this could be related to the Covid vaccine.  Her symptoms persisted and she was diagnosed with COVID-19 infection on approximately 04/02/2020.  She was treated with antibody infusion therapy on 04/06/2020.  She reports her symptoms quickly improved following the antibody infusion.  Ms. Fleury says she had a negative Covid test on 04/15/2020.  She has chronic back pain.  Ms. Hanks reports malaise.  No other complaint.  Objective:  Vital signs in last 24 hours:  Blood pressure (!) 156/74, pulse 60, temperature 98.1 F (36.7 C), resp. rate 20, height 5\' 5"  (1.651 m), weight 180 lb 6.4 oz (81.8 kg), SpO2 96 %.    Resp: Lungs clear bilaterally Cardio: Regular rate and rhythm GI: No hepatosplenomegaly, nontender Vascular: No leg edema  Skin: Mild hyperpigmentation of the lower leg bilaterally, no rash  Portacath/PICC-without erythema  Lab Results:  Lab Results  Component Value Date   WBC 3.9 (L) 04/21/2020   HGB 11.5 (L) 04/21/2020   HCT 35.4 (L) 04/21/2020   MCV 93.4 04/21/2020   PLT 123 (L) 04/21/2020   NEUTROABS 2.4 04/21/2020    CMP  Lab Results  Component Value Date   NA 139 04/21/2020   K 4.7 04/21/2020   CL 105 04/21/2020   CO2 28 04/21/2020   GLUCOSE 93 04/21/2020   BUN 21 04/21/2020   CREATININE 0.85 04/21/2020   CALCIUM 9.8 04/21/2020   PROT 7.4 04/21/2020   ALBUMIN 3.7 04/21/2020   AST 26 04/21/2020   ALT 21 04/21/2020   ALKPHOS 87 04/21/2020   BILITOT 0.3 04/21/2020   GFRNONAA >60 04/21/2020   GFRAA >60 04/21/2020    Medications: I have reviewed the patient's current medications.   Assessment/Plan: 1. Non-small cell lung  cancer  MRI lumbar spine 04/29/2019-enlarging marrow lesions involving the L1 vertebral body, upper left sacrum and right iliac bone  MRI pelvis 04/29/2019-3.5 cm left iliac bone lesion appears slightly larger; other similar appearing lesions present within the left superior pubic ramus, left superior abdomen acetabulum and upper left sacrum  Kappa free light chains with mild elevation 05/12/2019  CTs10/07/2019-left lower lobe pulmonary mass 3.3 x 3.2 cm; lytic process left iliac bone; spinal lesions; 1.1 cm low-density left kidney lesion; right thyroid enlargement with heterogeneous appearance with potential for multiple discrete lesions  Biopsy left lower lobe lung mass 05/26/2019-poorly differentiated carcinoma; positive for cytokeratin 5/6, p63 and TTF-1, no EGFR, BRAF, ALK,ERBB2,ROS, orNTRKalteration  Cycle 1 carboplatin/Alimta/pembrolizumab 06/06/2019  Cycle 2 carboplatin/Alimta/pembrolizumab 06/27/2019  Cycle 3 carboplatin/Alimta/pembrolizumab 07/18/2019  Cycle 4 carboplatin/Alimta/pembrolizumab1/01/2020  CTs 08/27/2019-significant decrease in size of lobulated mass left lower lobe. Unchanged appearance of subtle bone lesions.  Cycle 5 Alimta/pembrolizumab 08/28/2019  Cycle 6 Alimta/pembrolizumab 09/18/2019  Cycle 7 Alimta/pembrolizumab 10/09/2019  Cycle 8 Alimta/pembrolizumab 10/30/2019  Cycle 9 Alimta/pembrolizumab 11/20/2019  CTs 12/09/2019-no evidence of disease progression, left lower lobe nodule slightly decreased in size, stable L1, left sacral, and left pubic ramus metastases  Cycle 10 Alimta/pembrolizumab 12/11/2019  Cycle 11 pembrolizumab alone 01/02/2020 (Alimta held due to edema, tenderness, erythema at the lower legs)  Cycle 12 pembrolizumab 01/23/2020  Cycle 13 pembrolizumab 02/12/2020  Cycle 14 pembrolizumab 03/04/2020  CTs 03/18/2020-stable left lower lobe lesion, mild sclerosis at  the superior endplate of L1 that was previously hypermetabolic, stable small  left upper sacral lucent lesion, previous left superior pubic ramus lesion is occult on the CT, CT head negative for malignancy  Cycle 15 pembrolizumab 03/25/2020  Cycle 16 pembrolizumab 04/21/2020 2. Pain secondary to #1 3. Chronic back pain 4. Type 2 diabetes 5. Essential hypertension 6. CAD 7. Hyperlipidemia 8. Family history significant for multiple members with breast cancer 9. Grade 1 skin rash 07/18/2019 likely related to immunotherapy. Topical steroid cream as needed. 10. E. coli urinary tract infection 07/14/2019. Completed cephalexin. 11. Edema/tenderness at the right greater than left ankle 10/21/2019-etiology unclear, potentially related to systemic therapy or an infection, doxycycline prescribed-improved 10/23/2019; marked improvement 11/20/2019; at office visit 01/02/2020 she reports worsening of lower extremity edema, pain/tenderness, erythema 3 to 4 days following each treatment. Alimta held 01/02/2020. Referral to dermatology. 12.  COVID-19 infection 03/30/2020, monoclonal antibody therapy 04/06/2020   Disposition: Ms. Molitor appears unchanged.  She has recovered from the recent COVID-19 infection.  She will complete another treatment with pembrolizumab today.  Ms. Killian will return for an office visit and pembrolizumab in 3 weeks.  [name], MD  04/21/2020  10:49 AM

## 2020-04-21 NOTE — Patient Instructions (Signed)

## 2020-04-22 ENCOUNTER — Telehealth: Payer: Self-pay | Admitting: Oncology

## 2020-04-22 NOTE — Telephone Encounter (Signed)
Scheduled appointments per 9/22 los. Spoke to patient who is aware of appointment changes.

## 2020-05-06 ENCOUNTER — Other Ambulatory Visit: Payer: Medicare Other

## 2020-05-06 ENCOUNTER — Ambulatory Visit: Payer: Medicare Other

## 2020-05-06 ENCOUNTER — Ambulatory Visit: Payer: Medicare Other | Admitting: Nurse Practitioner

## 2020-05-09 ENCOUNTER — Other Ambulatory Visit: Payer: Self-pay | Admitting: Oncology

## 2020-05-11 ENCOUNTER — Other Ambulatory Visit: Payer: Self-pay | Admitting: *Deleted

## 2020-05-11 DIAGNOSIS — C3492 Malignant neoplasm of unspecified part of left bronchus or lung: Secondary | ICD-10-CM

## 2020-05-12 ENCOUNTER — Encounter: Payer: Self-pay | Admitting: Nurse Practitioner

## 2020-05-12 ENCOUNTER — Inpatient Hospital Stay (HOSPITAL_BASED_OUTPATIENT_CLINIC_OR_DEPARTMENT_OTHER): Payer: Medicare Other | Admitting: Nurse Practitioner

## 2020-05-12 ENCOUNTER — Other Ambulatory Visit: Payer: Self-pay

## 2020-05-12 ENCOUNTER — Inpatient Hospital Stay: Payer: Medicare Other

## 2020-05-12 ENCOUNTER — Inpatient Hospital Stay: Payer: Medicare Other | Attending: Oncology

## 2020-05-12 VITALS — BP 141/54 | HR 54 | Temp 97.9°F | Resp 18 | Wt 183.5 lb

## 2020-05-12 DIAGNOSIS — Z5112 Encounter for antineoplastic immunotherapy: Secondary | ICD-10-CM | POA: Insufficient documentation

## 2020-05-12 DIAGNOSIS — C3492 Malignant neoplasm of unspecified part of left bronchus or lung: Secondary | ICD-10-CM | POA: Diagnosis not present

## 2020-05-12 DIAGNOSIS — R7989 Other specified abnormal findings of blood chemistry: Secondary | ICD-10-CM | POA: Diagnosis not present

## 2020-05-12 DIAGNOSIS — Z79899 Other long term (current) drug therapy: Secondary | ICD-10-CM | POA: Insufficient documentation

## 2020-05-12 DIAGNOSIS — C3432 Malignant neoplasm of lower lobe, left bronchus or lung: Secondary | ICD-10-CM | POA: Diagnosis present

## 2020-05-12 LAB — CBC WITH DIFFERENTIAL (CANCER CENTER ONLY)
Abs Immature Granulocytes: 0.01 10*3/uL (ref 0.00–0.07)
Basophils Absolute: 0 10*3/uL (ref 0.0–0.1)
Basophils Relative: 0 %
Eosinophils Absolute: 0.1 10*3/uL (ref 0.0–0.5)
Eosinophils Relative: 2 %
HCT: 33.8 % — ABNORMAL LOW (ref 36.0–46.0)
Hemoglobin: 11 g/dL — ABNORMAL LOW (ref 12.0–15.0)
Immature Granulocytes: 0 %
Lymphocytes Relative: 30 %
Lymphs Abs: 1.5 10*3/uL (ref 0.7–4.0)
MCH: 30.2 pg (ref 26.0–34.0)
MCHC: 32.5 g/dL (ref 30.0–36.0)
MCV: 92.9 fL (ref 80.0–100.0)
Monocytes Absolute: 0.4 10*3/uL (ref 0.1–1.0)
Monocytes Relative: 9 %
Neutro Abs: 2.9 10*3/uL (ref 1.7–7.7)
Neutrophils Relative %: 59 %
Platelet Count: 138 10*3/uL — ABNORMAL LOW (ref 150–400)
RBC: 3.64 MIL/uL — ABNORMAL LOW (ref 3.87–5.11)
RDW: 13.6 % (ref 11.5–15.5)
WBC Count: 5 10*3/uL (ref 4.0–10.5)
nRBC: 0 % (ref 0.0–0.2)

## 2020-05-12 LAB — PROTEIN / CREATININE RATIO, URINE
Creatinine, Urine: 78.51 mg/dL
Total Protein, Urine: <6 mg/dL

## 2020-05-12 LAB — URINALYSIS, COMPLETE (UACMP) WITH MICROSCOPIC
Bilirubin Urine: NEGATIVE
Glucose, UA: NEGATIVE mg/dL
Hgb urine dipstick: NEGATIVE
Ketones, ur: NEGATIVE mg/dL
Leukocytes,Ua: NEGATIVE
Nitrite: NEGATIVE
Protein, ur: NEGATIVE mg/dL
Specific Gravity, Urine: 1.012 (ref 1.005–1.030)
pH: 5 (ref 5.0–8.0)

## 2020-05-12 LAB — CMP (CANCER CENTER ONLY)
ALT: 15 U/L (ref 0–44)
AST: 20 U/L (ref 15–41)
Albumin: 3.7 g/dL (ref 3.5–5.0)
Alkaline Phosphatase: 77 U/L (ref 38–126)
Anion gap: 2 — ABNORMAL LOW (ref 5–15)
BUN: 29 mg/dL — ABNORMAL HIGH (ref 8–23)
CO2: 29 mmol/L (ref 22–32)
Calcium: 9.6 mg/dL (ref 8.9–10.3)
Chloride: 106 mmol/L (ref 98–111)
Creatinine: 1.42 mg/dL — ABNORMAL HIGH (ref 0.44–1.00)
GFR, Estimated: 34 mL/min — ABNORMAL LOW (ref >60)
Glucose, Bld: 100 mg/dL — ABNORMAL HIGH (ref 70–99)
Potassium: 4.7 mmol/L (ref 3.5–5.1)
Sodium: 137 mmol/L (ref 135–145)
Total Bilirubin: 0.3 mg/dL (ref 0.3–1.2)
Total Protein: 6.9 g/dL (ref 6.5–8.1)

## 2020-05-12 MED ORDER — SODIUM CHLORIDE 0.9 % IV SOLN
Freq: Once | INTRAVENOUS | Status: AC
  Filled 2020-05-12: qty 250

## 2020-05-12 MED ORDER — SODIUM CHLORIDE 0.9% FLUSH
10.0000 mL | INTRAVENOUS | Status: DC | PRN
  Administered 2020-05-12: 10 mL
  Filled 2020-05-12: qty 10

## 2020-05-12 MED ORDER — HEPARIN SOD (PORK) LOCK FLUSH 100 UNIT/ML IV SOLN
500.0000 [IU] | Freq: Once | INTRAVENOUS | Status: AC | PRN
  Administered 2020-05-12: 500 [IU]
  Filled 2020-05-12: qty 5

## 2020-05-12 MED ORDER — SODIUM CHLORIDE 0.9 % IV SOLN: 200.0000 mg | Freq: Once | INTRAVENOUS | Status: DC

## 2020-05-12 NOTE — Progress Notes (Signed)
[place] OFFICE PROGRESS NOTE   Diagnosis: Non-small cell lung cancer  INTERVAL HISTORY:   Ms. Edmonds returns as scheduled.  She completed another cycle of Pembrolizumab 04/21/2020. No diarrhea or rash. She reports more pain in both groin regions, left hip and most recently over the sternum. She reports continued pain at the "bra line". She thinks dyspnea on exertion may be worse over the past couple weeks. The dyspnea does not occur on a daily basis and not every time she exerts herself.  Objective:  Vital signs in last 24 hours:  Blood pressure (!) 141/54, pulse (!) 54, temperature 97.9 F (36.6 C), temperature source Oral, resp. rate 18, weight 183 lb 8 oz (83.2 kg), SpO2 96 %.    HEENT: No thrush or ulcers.  Resp: Lungs clear bilaterally. Cardio: Regular rate and rhythm. GI: Abdomen soft and nontender. No hepatomegaly. Vascular: Trace lower leg edema bilaterally. Musculoskeletal: Tender over the mid sternum, region of the left greater trochanter. Skin: No rash.   Lab Results:  Lab Results  Component Value Date   WBC 5.0 05/12/2020   HGB 11.0 (L) 05/12/2020   HCT 33.8 (L) 05/12/2020   MCV 92.9 05/12/2020   PLT 138 (L) 05/12/2020   NEUTROABS 2.9 05/12/2020    Imaging:  No results found.  Medications: I have reviewed the patient's current medications.  Assessment/Plan: 1. Non-small cell lung cancer  MRI lumbar spine 04/29/2019-enlarging marrow lesions involving the L1 vertebral body, upper left sacrum and right iliac bone  MRI pelvis 04/29/2019-3.5 cm left iliac bone lesion appears slightly larger; other similar appearing lesions present within the left superior pubic ramus, left superior abdomen acetabulum and upper left sacrum  Kappa free light chains with mild elevation 05/12/2019  CTs10/07/2019-left lower lobe pulmonary mass 3.3 x 3.2 cm; lytic process left iliac bone; spinal lesions; 1.1 cm low-density left kidney lesion; right  thyroid enlargement with heterogeneous appearance with potential for multiple discrete lesions  Biopsy left lower lobe lung mass 05/26/2019-poorly differentiated carcinoma; positive for cytokeratin 5/6, p63 and TTF-1, no EGFR, BRAF, ALK,ERBB2,ROS, orNTRKalteration  Cycle 1 carboplatin/Alimta/pembrolizumab 06/06/2019  Cycle 2 carboplatin/Alimta/pembrolizumab 06/27/2019  Cycle 3 carboplatin/Alimta/pembrolizumab 07/18/2019  Cycle 4 carboplatin/Alimta/pembrolizumab1/01/2020  CTs 08/27/2019-significant decrease in size of lobulated mass left lower lobe. Unchanged appearance of subtle bone lesions.  Cycle 5 Alimta/pembrolizumab 08/28/2019  Cycle 6 Alimta/pembrolizumab 09/18/2019  Cycle 7 Alimta/pembrolizumab 10/09/2019  Cycle 8 Alimta/pembrolizumab 10/30/2019  Cycle 9 Alimta/pembrolizumab 11/20/2019  CTs 12/09/2019-no evidence of disease progression, left lower lobe nodule slightly decreased in size, stable L1, left sacral, and left pubic ramus metastases  Cycle 10 Alimta/pembrolizumab 12/11/2019  Cycle 11 pembrolizumab alone 01/02/2020 (Alimta held due to edema, tenderness, erythema at the lower legs)  Cycle 12 pembrolizumab 01/23/2020  Cycle 13 pembrolizumab 02/12/2020  Cycle 14 pembrolizumab 03/04/2020  CTs 03/18/2020-stable left lower lobe lesion, mild sclerosis at the superior endplate of L1 that was previously hypermetabolic, stable small left upper sacral lucent lesion, previous left superior pubic ramus lesion is occult on the CT, CT head negative for malignancy  Cycle 15 pembrolizumab 03/25/2020  Cycle 16 pembrolizumab 04/21/2020 2. Pain secondary to #1 3. Chronic back pain 4. Type 2 diabetes 5. Essential hypertension 6. CAD 7. Hyperlipidemia 8. Family history significant for multiple members with breast cancer 9. Grade 1 skin rash 07/18/2019 likely related to immunotherapy. Topical steroid cream as needed. 10. E. coli urinary tract infection 07/14/2019. Completed  cephalexin. 11. Edema/tenderness at the right greater than left ankle 10/21/2019-etiology unclear, potentially  related to systemic therapy or an infection, doxycycline prescribed-improved 10/23/2019; marked improvement 11/20/2019; at office visit 01/02/2020 she reports worsening of lower extremity edema, pain/tenderness, erythema 3 to 4 days following each treatment. Alimta held 01/02/2020. Referral to dermatology. 12. COVID-19 infection 03/30/2020, monoclonal antibody therapy 04/06/2020   Disposition: Ms. Tine appears unchanged.  The creatinine unexpectedly returned elevated.  I discussed this with the [place] pharmacist and Dr. Benay Spice.  We decided to hold today's treatment.  She will submit a urine specimen for a urinalysis, urine protein, urine protein/creatinine ratio.  We placed lisinopril on hold.  She will return for a follow-up basic metabolic panel on 03/70/4888, possible treatment that day if creatinine has returned to baseline.  At today's visit she reports pain involving multiple sites. We are referring her for a bone scan.  Return for lab 05/17/2020, Pembrolizumab that day if creatinine back to baseline.  We will see her in follow-up on 06/09/2020.  Plan reviewed with Dr. Benay Spice.   Ned Card ANP/GNP-BC   05/12/2020  2:42 PM

## 2020-05-12 NOTE — Patient Instructions (Signed)
Per Ned Card, NP no treatment today.  Return in one week for follow up.

## 2020-05-12 NOTE — Progress Notes (Signed)
Per Ned Card, NP no Keytruda today due to pts SCr = 1.42 Baseline jump > 0.3 mg/dL but right at 1.4x ULN Lab orders will be placed. Lattie Haw will evaluate nephrotoxic medications and hold any renally adj meds as a precaution. Plan to bring pt back next week for consideration of resuming Keytruda at that time.  Kennith Center, Pharm.D., CPP 05/12/2020@3 :22 PM

## 2020-05-13 LAB — TSH: TSH: 0.527 u[IU]/mL (ref 0.308–3.960)

## 2020-05-14 ENCOUNTER — Telehealth: Payer: Self-pay | Admitting: Nurse Practitioner

## 2020-05-14 NOTE — Telephone Encounter (Signed)
Scheduled per 10/13 los, patient has been called and voicemail has been left.

## 2020-05-15 ENCOUNTER — Other Ambulatory Visit: Payer: Self-pay | Admitting: Oncology

## 2020-05-17 ENCOUNTER — Other Ambulatory Visit: Payer: Self-pay

## 2020-05-17 ENCOUNTER — Inpatient Hospital Stay: Payer: Medicare Other

## 2020-05-17 VITALS — BP 140/74 | HR 62 | Temp 97.8°F | Resp 18

## 2020-05-17 DIAGNOSIS — Z5112 Encounter for antineoplastic immunotherapy: Secondary | ICD-10-CM | POA: Diagnosis not present

## 2020-05-17 DIAGNOSIS — C3492 Malignant neoplasm of unspecified part of left bronchus or lung: Secondary | ICD-10-CM

## 2020-05-17 DIAGNOSIS — Z95828 Presence of other vascular implants and grafts: Secondary | ICD-10-CM

## 2020-05-17 LAB — BASIC METABOLIC PANEL - CANCER CENTER ONLY
Anion gap: 7 (ref 5–15)
BUN: 22 mg/dL (ref 8–23)
CO2: 27 mmol/L (ref 22–32)
Calcium: 10.1 mg/dL (ref 8.9–10.3)
Chloride: 105 mmol/L (ref 98–111)
Creatinine: 0.97 mg/dL (ref 0.44–1.00)
GFR, Estimated: 54 mL/min — ABNORMAL LOW (ref >60)
Glucose, Bld: 109 mg/dL — ABNORMAL HIGH (ref 70–99)
Potassium: 4.3 mmol/L (ref 3.5–5.1)
Sodium: 139 mmol/L (ref 135–145)

## 2020-05-17 MED ORDER — SODIUM CHLORIDE 0.9 % IV SOLN
Freq: Once | INTRAVENOUS | Status: AC
  Filled 2020-05-17: qty 250

## 2020-05-17 MED ORDER — HEPARIN SOD (PORK) LOCK FLUSH 100 UNIT/ML IV SOLN
500.0000 [IU] | Freq: Once | INTRAVENOUS | Status: AC | PRN
  Administered 2020-05-17: 500 [IU]
  Filled 2020-05-17: qty 5

## 2020-05-17 MED ORDER — SODIUM CHLORIDE 0.9 % IV SOLN
200.0000 mg | Freq: Once | INTRAVENOUS | Status: AC
  Administered 2020-05-17: 200 mg via INTRAVENOUS
  Filled 2020-05-17: qty 8

## 2020-05-17 MED ORDER — SODIUM CHLORIDE 0.9% FLUSH
10.0000 mL | Freq: Once | INTRAVENOUS | Status: AC
  Administered 2020-05-17: 10 mL
  Filled 2020-05-17: qty 10

## 2020-05-17 MED ORDER — SODIUM CHLORIDE 0.9% FLUSH
10.0000 mL | INTRAVENOUS | Status: DC | PRN
  Administered 2020-05-17: 10 mL
  Filled 2020-05-17: qty 10

## 2020-05-17 NOTE — Patient Instructions (Signed)
[facility] Discharge Instructions for Patients Receiving Chemotherapy  Today you received the following chemotherapy agents Pembrolizumab [facility]).  To help prevent nausea and vomiting after your treatment, we encourage you to take your nausea medication as prescribed.   If you develop nausea and vomiting that is not controlled by your nausea medication, call the clinic.   BELOW ARE SYMPTOMS THAT SHOULD BE REPORTED IMMEDIATELY:  *FEVER GREATER THAN 100.5 F  *CHILLS WITH OR WITHOUT FEVER  NAUSEA AND VOMITING THAT IS NOT CONTROLLED WITH YOUR NAUSEA MEDICATION  *UNUSUAL SHORTNESS OF BREATH  *UNUSUAL BRUISING OR BLEEDING  TENDERNESS IN MOUTH AND THROAT WITH OR WITHOUT PRESENCE OF ULCERS  *URINARY PROBLEMS  *BOWEL PROBLEMS  UNUSUAL RASH Items with * indicate a potential emergency and should be followed up as soon as possible.  Feel free to call the clinic should you have any questions or concerns. The clinic phone number is [phone].  Please show the [facility] at check-in to the Emergency Department and triage nurse.

## 2020-05-19 ENCOUNTER — Encounter (HOSPITAL_COMMUNITY)
Admission: RE | Admit: 2020-05-19 | Discharge: 2020-05-19 | Disposition: A | Discharge status: Home / Self Care | Payer: Medicare Other | Source: Ambulatory Visit | Attending: Nurse Practitioner | Admitting: Nurse Practitioner

## 2020-05-19 ENCOUNTER — Other Ambulatory Visit: Payer: Self-pay

## 2020-05-19 DIAGNOSIS — C3492 Malignant neoplasm of unspecified part of left bronchus or lung: Secondary | ICD-10-CM | POA: Insufficient documentation

## 2020-05-19 MED ORDER — TECHNETIUM TC 99M MEDRONATE IV KIT
19.8000 | PACK | Freq: Once | INTRAVENOUS | Status: AC
  Administered 2020-05-19: 19.8 via INTRAVENOUS

## 2020-05-28 ENCOUNTER — Other Ambulatory Visit: Payer: Self-pay | Admitting: Oncology

## 2020-06-01 ENCOUNTER — Telehealth: Payer: Self-pay | Admitting: Nurse Practitioner

## 2020-06-01 NOTE — Telephone Encounter (Signed)
R/s appt per 10/29 sch msg- unable to reach pt . Left message for patient with new appt date and time

## 2020-06-02 ENCOUNTER — Other Ambulatory Visit: Payer: Medicare Other

## 2020-06-02 ENCOUNTER — Ambulatory Visit: Payer: Medicare Other | Admitting: Oncology

## 2020-06-02 ENCOUNTER — Ambulatory Visit: Payer: Medicare Other

## 2020-06-02 ENCOUNTER — Ambulatory Visit: Payer: Medicare Other | Admitting: Nurse Practitioner

## 2020-06-09 ENCOUNTER — Other Ambulatory Visit: Payer: Self-pay

## 2020-06-09 ENCOUNTER — Encounter: Payer: Self-pay | Admitting: Nurse Practitioner

## 2020-06-09 ENCOUNTER — Inpatient Hospital Stay: Payer: Medicare Other

## 2020-06-09 ENCOUNTER — Inpatient Hospital Stay: Payer: Medicare Other | Attending: Oncology | Admitting: Nurse Practitioner

## 2020-06-09 VITALS — BP 147/66 | HR 67 | Temp 97.4°F | Resp 17 | Ht 65.0 in | Wt 181.4 lb

## 2020-06-09 DIAGNOSIS — C3492 Malignant neoplasm of unspecified part of left bronchus or lung: Secondary | ICD-10-CM

## 2020-06-09 DIAGNOSIS — Z5112 Encounter for antineoplastic immunotherapy: Secondary | ICD-10-CM | POA: Insufficient documentation

## 2020-06-09 DIAGNOSIS — C3432 Malignant neoplasm of lower lobe, left bronchus or lung: Secondary | ICD-10-CM | POA: Diagnosis present

## 2020-06-09 DIAGNOSIS — Z95828 Presence of other vascular implants and grafts: Secondary | ICD-10-CM

## 2020-06-09 LAB — CMP (CANCER CENTER ONLY)
ALT: 18 U/L (ref 0–44)
AST: 23 U/L (ref 15–41)
Albumin: 4.1 g/dL (ref 3.5–5.0)
Alkaline Phosphatase: 83 U/L (ref 38–126)
Anion gap: 8 (ref 5–15)
BUN: 24 mg/dL — ABNORMAL HIGH (ref 8–23)
CO2: 25 mmol/L (ref 22–32)
Calcium: 9.8 mg/dL (ref 8.9–10.3)
Chloride: 106 mmol/L (ref 98–111)
Creatinine: 0.96 mg/dL (ref 0.44–1.00)
GFR, Estimated: 59 mL/min — ABNORMAL LOW (ref >60)
Glucose, Bld: 110 mg/dL — ABNORMAL HIGH (ref 70–99)
Potassium: 4.4 mmol/L (ref 3.5–5.1)
Sodium: 139 mmol/L (ref 135–145)
Total Bilirubin: 0.4 mg/dL (ref 0.3–1.2)
Total Protein: 7.2 g/dL (ref 6.5–8.1)

## 2020-06-09 LAB — CBC WITH DIFFERENTIAL (CANCER CENTER ONLY)
Abs Immature Granulocytes: 0.01 10*3/uL (ref 0.00–0.07)
Basophils Absolute: 0 10*3/uL (ref 0.0–0.1)
Basophils Relative: 1 %
Eosinophils Absolute: 0.3 10*3/uL (ref 0.0–0.5)
Eosinophils Relative: 8 %
HCT: 36.5 % (ref 36.0–46.0)
Hemoglobin: 11.8 g/dL — ABNORMAL LOW (ref 12.0–15.0)
Immature Granulocytes: 0 %
Lymphocytes Relative: 29 %
Lymphs Abs: 1.3 10*3/uL (ref 0.7–4.0)
MCH: 30.1 pg (ref 26.0–34.0)
MCHC: 32.3 g/dL (ref 30.0–36.0)
MCV: 93.1 fL (ref 80.0–100.0)
Monocytes Absolute: 0.4 10*3/uL (ref 0.1–1.0)
Monocytes Relative: 9 %
Neutro Abs: 2.3 10*3/uL (ref 1.7–7.7)
Neutrophils Relative %: 53 %
Platelet Count: 150 10*3/uL (ref 150–400)
RBC: 3.92 MIL/uL (ref 3.87–5.11)
RDW: 13.7 % (ref 11.5–15.5)
WBC Count: 4.4 10*3/uL (ref 4.0–10.5)
nRBC: 0 % (ref 0.0–0.2)

## 2020-06-09 MED ORDER — SODIUM CHLORIDE 0.9% FLUSH
10.0000 mL | Freq: Once | INTRAVENOUS | Status: AC
  Administered 2020-06-09: 3 mL
  Filled 2020-06-09: qty 10

## 2020-06-09 MED ORDER — HEPARIN SOD (PORK) LOCK FLUSH 100 UNIT/ML IV SOLN
500.0000 [IU] | Freq: Once | INTRAVENOUS | Status: AC
  Administered 2020-06-09: 500 [IU]
  Filled 2020-06-09: qty 5

## 2020-06-09 NOTE — Patient Instructions (Signed)

## 2020-06-09 NOTE — Progress Notes (Addendum)
[place] OFFICE PROGRESS NOTE   Diagnosis: Non-small cell lung cancer  INTERVAL HISTORY:   [name] returns as scheduled.  She completed another cycle of pembrolizumab 05/12/2020.  She denies nausea/vomiting.  No rash.  No diarrhea.  She reports a good appetite and fairly good energy level.  She continues to have back and hip pain.  She takes Tylenol, ibuprofen or Aleve as needed.  Objective:  Vital signs in last 24 hours:  Blood pressure (!) 147/66, pulse 67, temperature (!) 97.4 F (36.3 C), temperature source Tympanic, resp. rate 17, height $RemoveBe'5\' 5"'WWMUHNAqw$  (1.651 m), weight 181 lb 6.4 oz (82.3 kg), SpO2 97 %.    HEENT: No thrush or ulcers. Resp: Lungs clear bilaterally. Cardio: Regular rate and rhythm. GI: Abdomen soft and nontender.  No hepatomegaly. Vascular: No leg edema. Port-A-Cath without erythema.  Lab Results:  Lab Results  Component Value Date   WBC 4.4 06/09/2020   HGB 11.8 (L) 06/09/2020   HCT 36.5 06/09/2020   MCV 93.1 06/09/2020   PLT 150 06/09/2020   NEUTROABS 2.3 06/09/2020    Imaging:  No results found.  Medications: I have reviewed the patient's current medications.  Assessment/Plan: 1. Non-small cell lung cancer  MRI lumbar spine 04/29/2019-enlarging marrow lesions involving the L1 vertebral body, upper left sacrum and right iliac bone  MRI pelvis 04/29/2019-3.5 cm left iliac bone lesion appears slightly larger; other similar appearing lesions present within the left superior pubic ramus, left superior abdomen acetabulum and upper left sacrum  Kappa free light chains with mild elevation 05/12/2019  CTs10/07/2019-left lower lobe pulmonary mass 3.3 x 3.2 cm; lytic process left iliac bone; spinal lesions; 1.1 cm low-density left kidney lesion; right thyroid enlargement with heterogeneous appearance with potential for multiple discrete lesions  Biopsy left lower lobe lung mass 05/26/2019-poorly differentiated carcinoma; positive  for cytokeratin 5/6, p63 and TTF-1, no EGFR, BRAF, ALK,ERBB2,ROS, orNTRKalteration  Cycle 1 carboplatin/Alimta/pembrolizumab 06/06/2019  Cycle 2 carboplatin/Alimta/pembrolizumab 06/27/2019  Cycle 3 carboplatin/Alimta/pembrolizumab 07/18/2019  Cycle 4 carboplatin/Alimta/pembrolizumab1/01/2020  CTs 08/27/2019-significant decrease in size of lobulated mass left lower lobe. Unchanged appearance of subtle bone lesions.  Cycle 5 Alimta/pembrolizumab 08/28/2019  Cycle 6 Alimta/pembrolizumab 09/18/2019  Cycle 7 Alimta/pembrolizumab 10/09/2019  Cycle 8 Alimta/pembrolizumab 10/30/2019  Cycle 9 Alimta/pembrolizumab 11/20/2019  CTs 12/09/2019-no evidence of disease progression, left lower lobe nodule slightly decreased in size, stable L1, left sacral, and left pubic ramus metastases  Cycle 10 Alimta/pembrolizumab 12/11/2019  Cycle 11 pembrolizumab alone 01/02/2020 (Alimta held due to edema, tenderness, erythema at the lower legs)  Cycle 12 pembrolizumab 01/23/2020  Cycle 13 pembrolizumab 02/12/2020  Cycle 14 pembrolizumab 03/04/2020  CTs 03/18/2020-stable left lower lobe lesion, mild sclerosis at the superior endplate of L1 that was previously hypermetabolic, stable small left upper sacral lucent lesion, previous left superior pubic ramus lesion is occult on the CT, CT head negative for malignancy  Cycle 15 pembrolizumab 03/25/2020  Cycle 16 pembrolizumab 04/21/2020  Cycle 17 pembrolizumab 05/17/2020  05/19/2020 bone scan-no definite abnormalities to suggest osseous metastases.  Areas of concern on prior PET-CT involving left iliac bone and L1 vertebral body showed no abnormalities on the current study  Cycle 18 Pembrolizumab 06/10/2020 2. Pain secondary to #1 3. Chronic back pain 4. Type 2 diabetes 5. Essential hypertension 6. CAD 7. Hyperlipidemia 8. Family history significant for multiple members with breast cancer 9. Grade 1 skin rash 07/18/2019 likely related to immunotherapy. Topical  steroid cream as needed. 10. E. coli urinary tract infection 07/14/2019. Completed cephalexin.  11. Edema/tenderness at the right greater than left ankle 10/21/2019-etiology unclear, potentially related to systemic therapy or an infection, doxycycline prescribed-improved 10/23/2019; marked improvement 11/20/2019; at office visit 01/02/2020 she reports worsening of lower extremity edema, pain/tenderness, erythema 3 to 4 days following each treatment. Alimta held 01/02/2020. Referral to dermatology. 12. COVID-19 infection 03/30/2020, monoclonal antibody therapy 04/06/2020    Disposition: [name] appears stable.  She has completed 17 cycles of pembrolizumab.  Plan to proceed with cycle 18 as scheduled 06/10/2020.  We reviewed the CBC and chemistry panel from today.  Labs adequate to proceed as above.  She continues to have back and hip pain.  We discussed referring for a PET scan or pelvic MRI.  She prefers to hold on further imaging at this time.  She will contact the office if the pain worsens.  She will return for lab, follow-up, pembrolizumab in 3 weeks.  Patient seen with Dr. Benay Spice.  Ned Card ANP/GNP-BC   06/09/2020  10:39 AM  This was a shared visit with Ned Card.  [name] was interviewed and examined.  Her overall status is stable.  The back and hand pain are most likely related to a benign musculoskeletal condition.  We will refer her for an MRI or PET evaluation if the pain becomes more intense.  Julieanne Manson, MD

## 2020-06-10 ENCOUNTER — Other Ambulatory Visit: Payer: Self-pay

## 2020-06-10 ENCOUNTER — Inpatient Hospital Stay: Payer: Medicare Other

## 2020-06-10 VITALS — BP 139/70 | HR 58 | Temp 98.0°F | Resp 18

## 2020-06-10 DIAGNOSIS — C3492 Malignant neoplasm of unspecified part of left bronchus or lung: Secondary | ICD-10-CM

## 2020-06-10 DIAGNOSIS — Z5112 Encounter for antineoplastic immunotherapy: Secondary | ICD-10-CM | POA: Diagnosis not present

## 2020-06-10 MED ORDER — SODIUM CHLORIDE 0.9 % IV SOLN
Freq: Once | INTRAVENOUS | Status: AC
  Filled 2020-06-10: qty 250

## 2020-06-10 MED ORDER — SODIUM CHLORIDE 0.9% FLUSH
10.0000 mL | INTRAVENOUS | Status: DC | PRN
  Filled 2020-06-10: qty 10

## 2020-06-10 MED ORDER — HEPARIN SOD (PORK) LOCK FLUSH 100 UNIT/ML IV SOLN
500.0000 [IU] | Freq: Once | INTRAVENOUS | Status: AC | PRN
  Administered 2020-06-10: 500 [IU]
  Filled 2020-06-10: qty 5

## 2020-06-10 MED ORDER — SODIUM CHLORIDE 0.9 % IV SOLN
200.0000 mg | Freq: Once | INTRAVENOUS | Status: AC
  Administered 2020-06-10: 200 mg via INTRAVENOUS
  Filled 2020-06-10: qty 8

## 2020-06-10 NOTE — Patient Instructions (Signed)
[facility] Discharge Instructions for Patients Receiving Chemotherapy  Today you received the following chemotherapy agents:  Keytruda.  To help prevent nausea and vomiting after your treatment, we encourage you to take your nausea medication as directed.   If you develop nausea and vomiting that is not controlled by your nausea medication, call the clinic.   BELOW ARE SYMPTOMS THAT SHOULD BE REPORTED IMMEDIATELY:  *FEVER GREATER THAN 100.5 F  *CHILLS WITH OR WITHOUT FEVER  NAUSEA AND VOMITING THAT IS NOT CONTROLLED WITH YOUR NAUSEA MEDICATION  *UNUSUAL SHORTNESS OF BREATH  *UNUSUAL BRUISING OR BLEEDING  TENDERNESS IN MOUTH AND THROAT WITH OR WITHOUT PRESENCE OF ULCERS  *URINARY PROBLEMS  *BOWEL PROBLEMS  UNUSUAL RASH Items with * indicate a potential emergency and should be followed up as soon as possible.  Feel free to call the clinic should you have any questions or concerns. The clinic phone number is [phone].  Please show the CHEMO ALERT CARD at check-in to the Emergency Department and triage nurse.    

## 2020-06-11 ENCOUNTER — Telehealth: Payer: Self-pay | Admitting: Oncology

## 2020-06-11 NOTE — Telephone Encounter (Signed)
Scheduled appointment per 11/11 los. Called patient, no answer. Left message for patient with appointment date and time.

## 2020-06-21 ENCOUNTER — Ambulatory Visit (INDEPENDENT_AMBULATORY_CARE_PROVIDER_SITE_OTHER): Payer: Medicare Other | Admitting: Cardiovascular Disease

## 2020-06-21 ENCOUNTER — Encounter: Payer: Self-pay | Admitting: Cardiovascular Disease

## 2020-06-21 ENCOUNTER — Other Ambulatory Visit: Payer: Self-pay

## 2020-06-21 VITALS — BP 140/66 | HR 59 | Ht 65.0 in | Wt 181.0 lb

## 2020-06-21 DIAGNOSIS — E785 Hyperlipidemia, unspecified: Secondary | ICD-10-CM

## 2020-06-21 DIAGNOSIS — I2510 Atherosclerotic heart disease of native coronary artery without angina pectoris: Secondary | ICD-10-CM

## 2020-06-21 DIAGNOSIS — I358 Other nonrheumatic aortic valve disorders: Secondary | ICD-10-CM

## 2020-06-21 DIAGNOSIS — I10 Essential (primary) hypertension: Secondary | ICD-10-CM | POA: Diagnosis not present

## 2020-06-21 DIAGNOSIS — I422 Other hypertrophic cardiomyopathy: Secondary | ICD-10-CM

## 2020-06-21 DIAGNOSIS — U071 COVID-19: Secondary | ICD-10-CM

## 2020-06-21 DIAGNOSIS — C3492 Malignant neoplasm of unspecified part of left bronchus or lung: Secondary | ICD-10-CM

## 2020-06-21 LAB — IFOBT (OCCULT BLOOD): IFOBT: NEGATIVE

## 2020-06-21 NOTE — Patient Instructions (Signed)
.  Medication Instructions:   No changes  Lab Work:  Not needed   Testing/Procedures:    not needed  Follow-Up: At [facility], you and your health needs are our priority.  As part of our continuing mission to provide you with exceptional heart care, we have created designated Provider Care Teams.  These Care Teams include your primary Cardiologist (physician) and Advanced Practice Providers (APPs -  Physician Assistants and Nurse Practitioners) who all work together to provide you with the care you need, when you need it.     Your next appointment:   6 month(s)  The format for your next appointment:   In Person  Provider:   Shelva Majestic, MD

## 2020-06-21 NOTE — Progress Notes (Signed)
Patient ID: [name], female   DOB: 06/20/1935, 85 y.o.   MRN: [id number]      HPI: [name] is a 85 y.o. female who presents to the office for a 9 month cardiology evaluation.  [name] has known CAD and in March 2012 underwent stenting of a 90% eccentric RCA stenosis with a 3.0x15 mm integrity bare-metal stent. She developed recurrent chest pain in July 2012 and repeat catheterization revealed widely patent stent. She did have 60% postural diagonal 1 stenosis and medical therapy was recommended. She had mild luminal irregularities of the LAD.  In 2012 an echo Doppler study did show normal systolic function with mild aortic sclerosis mild MR and mild TR. She has a history of hyperlipidemia, restless legs, as well as documented insulin resistance.  She developed a herniated disc in her neck and underwent surgery by Dr. Sherley Bounds on November 30 1 2014. She tolerated surgery well from a cardiovascular standpoint.  She has mild nerve damage from an episode of shingles which is improved with Neurontin. She does have restless legs which she takes Sinemet 25/100, one quarter of a tablet 2 times per day. She does have hyperlipidemia. She does have hypertension. She denies presyncope or syncope.    When I saw her in 2016 she had noticed development of rare chest pain when she walks fast or walks up a steep incline.  She underwent a nuclear perfusion study in October 2016 which showed normal perfusion and function without scar or ischemia.    She underwent eye surgery in  August 2017 involving her lacrimal duct.  She did have significant nosebleed following this.  She underwent a follow-up echo Doppler study in December 2017 which showed normal ejection fraction at 55-60% with grade 1 diastolic dysfunction.  There was aortic valve sclerosis without stenosis.  She tells me that her metformin was discontinued this past year by Dr. [name].  Laboratory had shown a hemoglobin  A1c of 6.1.  In May 2018 LDL cholesterol was 63 on rosuvastatin.  I last saw her in February 2019.  At that time her blood pressure was stable she has continued to be on lisinopril 5 mg daily, metoprolol 37.5 mg twice a day for hypertension.  She has peripheral neuropathy on gabapentin 300 mg daily.  She has restless legs, and also takes carbidopa levodopa 25/100 mg one quarter of a tablet twice a day with improvement in symptomatology.  She denied any recurrent anginal symptoms.  There is mild shortness of breath with activity.  She is active.  I encouraged at least 150 minutes/week of exercise.  I  saw her in February 2020.  At that time felt fairly well from a cardiac standpoint.  She continues to experience some mild shortness of breath with activity without significant change.  She was walking her dog at least 20 minutes a day basis.  She has been documented to have diastolic dysfunction on echocardiography.  She was under  increased stress with her husband's illness who in addition to his bronchiectasis also has developed leukemia.  I last saw her in February 2021.  Since her prior evaluation with me she unfortunately  was diagnosed with non-small cell lung CA, stage IV with metastatic bone lesion.  She is followed by Dr. Benay Spice.  She completed 4 cycles of carboplatin/Alimta/pembrolizumab on August 07, 2019.  She had a remote history of smoking and had smoked 1 pack/day for 25 years and quit smoking in 1990, 31 years ago.  She denied any chest pain, PND orthopnea.  During that evaluation, on exam she had an early peaking systolic murmur felt consistent with probable mild aortic stenosis.  She had not had an echo Doppler study since 2017 and I commended a follow-up echo Doppler assessment.  She underwent a follow-up echo Doppler study in September 26, 2019.  This showed an EF of 60 to 65%.  There was moderate asymmetric left ventricular hypertrophy of the basal septum without S.A.M. or LVOT obstruction.   There was grade 1 diastolic dysfunction.  Estimated RV systolic pressure was 54.2 mm.  Aortic valve was tricuspid and there was no evidence for stenosis.  Presently, she continues to be followed by oncology for her metastatic lung cancer and has completed 18 cycles of pembrolizumab.  She continues to have back and hip pain.  She also developed a COVID-19 infection on March 30, 2020 and received monoclonal antibody therapy on April 06, 2020.  She denies any chest pain, PND orthopnea.  She denies any presyncope or syncope.  She presents for evaluation.  Past Medical History:  Diagnosis Date   Aneurysm ([place])    Right eye a non DES stent was placed so that she would not required long term dual antiplatelet therapy.   Anxiety    not currently taking any meds   Arthritis    CAD (coronary artery disease)    Diabetes mellitus without complication (HCC)    prediabetes per pt   H/O hiatal hernia    Heart murmur    History of stress test 03/2012   The post stress myocardial perfusion images show a normal pattern of perfusion in all region. The post left ventricles is normal in size. There is no scintigraphic of inductible myocardial ischemia. The post EF is 24   Hx of echocardiogram 11/2010   Ef 67% Normal size chambes, Aortic valve sclerosis without stenosis, No other significant valvular abnormalities, No percardial effusion.   Hyperlipemia    Hypertension    Insulin resistance    Multiple thyroid nodules    Neuromuscular disorder (HCC)    nerve pain after shingles   Osteoporosis    Restless legs    Shingles     Past Surgical History:  Procedure Laterality Date   ABDOMINAL HYSTERECTOMY  1970   ANGIOPLASTY     Stenting of a 90% eccentric right coronary artery stenosis and had a 3.0x15 mm Integrity bare-metal stent inserted   ANTERIOR CERVICAL DECOMP/DISCECTOMY FUSION N/A 07/30/2013   Procedure: ANTERIOR CERVICAL DECOMPRESSION/DISCECTOMY FUSION CERVICAL FIVE -SIX;   Surgeon: Eustace Moore, MD;  Location: [facility];  Service: Neurosurgery;  Laterality: N/A;   APPENDECTOMY     BREAST SURGERY Left    cysts removed from left breast (in her 20'2)   CARDIAC CATHETERIZATION     Showed a widely patent stent, she did have 60% ostial diagonal-1 stenosis, She also had mild luminal irregularities of her LAD.   COLONOSCOPY     EYE SURGERY Bilateral 2009   cateract surgery- bilateral   IR IMAGING GUIDED PORT INSERTION  06/04/2019   REFRACTIVE SURGERY Left 2011   TONSILLECTOMY      Allergies  Allergen Reactions   Codeine Nausea And Vomiting and Other (See Comments)    Severe constipation.    Current Outpatient Medications  Medication Sig Dispense Refill   acetaminophen (TYLENOL) 500 MG tablet Take 1,000 mg by mouth 2 (two) times daily as needed (pain).     ALPRAZolam (XANAX) 0.5 MG tablet  Take 1 tablet by mouth 2 (two) times daily as needed for anxiety.      carboxymethylcellulose (REFRESH PLUS) 0.5 % SOLN Apply 1 drop to eye 3 (three) times daily as needed.     diclofenac sodium (VOLTAREN) 1 % GEL Apply 1 application topically 3 (three) times daily as needed (pain).     gabapentin (NEURONTIN) 300 MG capsule Take 300 mg by mouth at bedtime.      lidocaine (LIDODERM) 5 % SMARTSIG:0-3 Patch(s) Topical Daily     lidocaine-prilocaine (EMLA) cream Apply 1 application topically as needed. 30 g 2   Magnesium 250 MG TABS Take 250 mg by mouth at bedtime.      metFORMIN (GLUCOPHAGE-XR) 500 MG 24 hr tablet Take 500 mg by mouth at bedtime.      metoprolol tartrate (LOPRESSOR) 25 MG tablet Take 25 mg by mouth at bedtime.      Multiple Vitamins-Minerals (CENTRUM SILVER PO) Take 1 tablet by mouth daily.     naproxen sodium (ALEVE) 220 MG tablet Take 440 mg by mouth 2 (two) times daily as needed (pain).     nitroGLYCERIN (NITROSTAT) 0.4 MG SL tablet Place 0.4 mg under the tongue every 5 (five) minutes as needed for chest pain.      ondansetron  (ZOFRAN) 8 MG tablet Take 1 tablet (8 mg total) by mouth every 8 (eight) hours as needed for nausea or vomiting. 30 tablet 1   Pembrolizumab (KEYTRUDA IV) Inject into the vein. Every three weeks.     polyethylene glycol (MIRALAX / GLYCOLAX) packet Take 17 g by mouth every morning.      Probiotic Product (PROBIOTIC ADVANCED PO) Take 1 capsule by mouth daily.      rOPINIRole (REQUIP) 1 MG tablet Take 1 mg by mouth 2 (two) times daily. In the evening     rosuvastatin (CRESTOR) 40 MG tablet Take 40 mg by mouth at bedtime.      traZODone (DESYREL) 100 MG tablet Take 100 mg by mouth at bedtime.      Vitamin D, Ergocalciferol, (DRISDOL) 50000 UNITS CAPS capsule Take 50,000 Units by mouth every 7 (seven) days.     zinc gluconate 50 MG tablet Take 50 mg by mouth daily.     lisinopril (ZESTRIL) 10 MG tablet TAKE 1 TABLET BY MOUTH  DAILY 90 tablet 3   No current facility-administered medications for this visit.    Social History   Socioeconomic History   Marital status: Married    Spouse name: Not on file   Number of children: Not on file   Years of education: Not on file   Highest education level: Not on file  Occupational History   Not on file  Tobacco Use   Smoking status: Former Smoker    Packs/day: 2.00    Years: 33.00    Pack years: 66.00    Types: Cigarettes   Smokeless tobacco: Never Used   Tobacco comment: quit in 1995.  Vaping Use   Vaping Use: Never used  Substance and Sexual Activity   Alcohol use: Yes    Alcohol/week: 0.0 standard drinks    Comment: occasional (maybe twice a year)   Drug use: No   Sexual activity: Not on file  Other Topics Concern   Not on file  Social History Narrative   Not on file   Social Determinants of Health   Financial Resource Strain:    Difficulty of Paying Living Expenses: Not on file  Food Insecurity:  Worried About Charity fundraiser in the Last Year: Not on file   [employer] of Food in the Last Year: Not on  file  Transportation Needs:    Lack of Transportation (Medical): Not on file   Lack of Transportation (Non-Medical): Not on file  Physical Activity:    Days of Exercise per Week: Not on file   Minutes of Exercise per Session: Not on file  Stress:    Feeling of Stress : Not on file  Social Connections:    Frequency of Communication with Friends and Family: Not on file   Frequency of Social Gatherings with Friends and Family: Not on file   Attends Religious Services: Not on file   Active Member of Clubs or Organizations: Not on file   Attends Archivist Meetings: Not on file   Marital Status: Not on file  Intimate Partner Violence:    Fear of Current or Ex-Partner: Not on file   Emotionally Abused: Not on file   Physically Abused: Not on file   Sexually Abused: Not on file    Family History  Problem Relation Age of Onset   Alzheimer's disease Mother 33   Colon polyps Mother    Diabetes Mother    Heart attack Father 62   Cancer Maternal Grandmother 83   Breast cancer Maternal Grandmother    Diabetes Sister    Breast cancer Maternal Uncle    Breast cancer Maternal Aunt        x 2   Colon polyps Sister    Colon polyps Maternal Aunt    Colon cancer Neg Hx    Esophageal cancer Neg Hx    Rectal cancer Neg Hx    Stomach cancer Neg Hx    Socially she is married has one child and 2 grandchildren. There is no tobacco or alcohol use.  ROS General: Negative; No fevers, chills, or night sweats;  HEENT: Negative; No changes in vision or hearing, sinus congestion, difficulty swallowing Pulmonary: Positive for non-small cell lung cancer, metastatic to bone multiple sites Cardiovascular: see HPI GI: GERD; No nausea, vomiting, diarrhea, or abdominal pain GU: Negative; No dysuria, hematuria, or difficulty voiding Musculoskeletal: Negative; no myalgias, joint pain, or weakness Hematologic/Oncology: Negative; no easy bruising, bleeding Endocrine:  Negative; no heat/cold intolerance; no diabetes Neuro: Positive for restless legs Skin: Negative; No rashes or skin lesions Psychiatric: Negative; No behavioral problems, depression Sleep: Negative; No snoring, daytime sleepiness, hypersomnolence, bruxism, restless legs, hypnogognic hallucinations, no cataplexy Other comprehensive 14 point system review is negative   PE. BP 140/66    Pulse (!) 59    Ht '5\' 5"'  (1.651 m)    Wt 181 lb (82.1 kg)    BMI 30.12 kg/m   Repeat blood pressure by me was 120/64  Wt Readings from Last 3 Encounters:  06/21/20 181 lb (82.1 kg)  06/09/20 181 lb 6.4 oz (82.3 kg)  05/12/20 183 lb 8 oz (83.2 kg)     Physical Exam BP 140/66    Pulse (!) 59    Ht '5\' 5"'  (1.651 m)    Wt 181 lb (82.1 kg)    BMI 30.12 kg/m  General: Alert, oriented, no distress.  Skin: normal turgor, no rashes, warm and dry HEENT: Normocephalic, atraumatic. Pupils equal round and reactive to light; sclera anicteric; extraocular muscles intact;  Nose without nasal septal hypertrophy Mouth/Parynx benign; Mallinpatti scale Neck: No JVD, no carotid bruits; normal carotid upstroke Lungs: clear to ausculatation and percussion; no  wheezing or rales Chest wall: without tenderness to palpitation Heart: PMI not displaced, RRR, s1 s2 normal, 2/6 early peaking systolic murmur, no diastolic murmur, no rubs, gallops, thrills, or heaves Abdomen: soft, nontender; no hepatosplenomehaly, BS+; abdominal aorta nontender and not dilated by palpation. Back: no CVA tenderness Pulses 2+ Musculoskeletal: full range of motion, normal strength, no joint deformities Extremities: no clubbing cyanosis or edema, Homan's sign negative  Neurologic: grossly nonfocal; Cranial nerves grossly wnl Psychologic: Normal mood and affect    ECG (independently read by me): Sinus bradycardia at 59; no ectopy; PR 200 msec, QTc 386 msec  September 12, 2019 ECG (independently read by me): Sinus bradycardia at 59; MIld LVH;  normal intervals  January 2020 ECG (independently read by me): Sinus bradycardia 53 bpm with first-degree AV block..  Well to 10 ms.  LVH by voltage  February 2019 ECG (independently read by me): Sinus bradycardia at 59 bpm.  First degree AV block with a PR interval of 2:30 milliseconds.  No syncope ST-T changes.  November 2017 ECG (independently read by me): Normal sinus rhythm at 62 bpm.  No ectopy.  Normal intervals.  May 2017 ECG (independently read by me): Sinus bradycardia 55 beats per minute with first-degree AV block, PR interval 238 ms.  No ST segment changes.  September 2016 ECG (independently read by me): Sinus bradycardia at 55 bpm.  Nonspecific ST changes  March 2015 ECG (independently read by me): Sinus bradycardia 55 beats per minute. Nonspecific ST changes appear.  Prior 06/03/2013 ECG: Sinus rhythm with first degree AV block. QRS complex V1 V2. Nonspecific ST changes which are present previously. Borderline LVH by voltage in aVL  LABS: BMP Latest Ref Rng & Units 06/09/2020 05/17/2020 05/12/2020  Glucose 70 - 99 mg/dL 110(H) 109(H) 100(H)  BUN 8 - 23 mg/dL 24(H) 22 29(H)  Creatinine 0.44 - 1.00 mg/dL 0.96 0.97 1.42(H)  Sodium 135 - 145 mmol/L 139 139 137  Potassium 3.5 - 5.1 mmol/L 4.4 4.3 4.7  Chloride 98 - 111 mmol/L 106 105 106  CO2 22 - 32 mmol/L '25 27 29  ' Calcium 8.9 - 10.3 mg/dL 9.8 10.1 9.6   Hepatic Function Latest Ref Rng & Units 06/09/2020 05/12/2020 04/21/2020  Total Protein 6.5 - 8.1 g/dL 7.2 6.9 7.4  Albumin 3.5 - 5.0 g/dL 4.1 3.7 3.7  AST 15 - 41 U/L '23 20 26  ' ALT 0 - 44 U/L '18 15 21  ' Alk Phosphatase 38 - 126 U/L 83 77 87  Total Bilirubin 0.3 - 1.2 mg/dL 0.4 0.3 0.3   CBC Latest Ref Rng & Units 06/09/2020 05/12/2020 04/21/2020  WBC 4.0 - 10.5 K/uL 4.4 5.0 3.9(L)  Hemoglobin 12.0 - 15.0 g/dL 11.8(L) 11.0(L) 11.5(L)  Hematocrit 36 - 46 % 36.5 33.8(L) 35.4(L)  Platelets 150 - 400 K/uL 150 138(L) 123(L)   Lab Results  Component Value Date   MCV 93.1  06/09/2020   MCV 92.9 05/12/2020   MCV 93.4 04/21/2020   Lab Results  Component Value Date   TSH 0.527 05/12/2020   Lab Results  Component Value Date   HGBA1C 6.8 (H) 02/02/2011   Lipid Panel     Component Value Date/Time   CHOL 129 04/13/2015 0802   CHOL 141 06/13/2013 0838   TRIG 87 04/13/2015 0802   TRIG 129 06/13/2013 0838   HDL 57 04/13/2015 0802   HDL 53 06/13/2013 0838   CHOLHDL 2.3 04/13/2015 0802   VLDL 17 04/13/2015 0802   LDLCALC 55 04/13/2015  0802   LDLCALC 62 06/13/2013 0838     RADIOLOGY: No results found.  IMPRESSION: 1. Essential hypertension   2. Coronary artery disease involving native coronary artery of native heart without angina pectoris   3. Non-small cell cancer of left lung ([place]) metastatic to bone   4. Hyperlipidemia with target LDL less than 70   5. Asymmetric septal hypertrophy (HCC)   6. Aortic valve sclerosis   7. COVID-19 virus infection: 03/30/2020; antibody infusion 04/06/2020     ASSESSMENT AND PLAN: [name] is an 85 year old female who has established CAD and underwent stenting of her RCA with a bare metal stent in March 2012 at which time she also was found to have a 60% ostial diagonal stenosis. She has a history of hyperlipidemia.  An echo Doppler study in December 2014 showed an ejection fraction at 55-60% without wall motion abnormalities. There was mild aortic stenosis. Mitral valve was structurally normal.  Subsequently she had undergone a follow-up nuclear perfusion study due to symptoms of vague chest pain as well as exertional dyspnea which revealed normal perfusion.  An echo Doppler study on July 27, 2016 again showed LVEF at 55 to 60%.  There was grade 1 diastolic dysfunction.  There was minimal PA pressure elevation at 32 mm.  There was mild aortic sclerosis without restrictive mobility.  There was no AR.  Her most recent echo Doppler study in February 2021 showed normal systolic function with EF at 60 to 65%, moderate  asymmetric LVH of the basal septal segment without capital S.A.M. or LVOT obstruction.  There was grade 1 diastolic dysfunction.  She had upper normal RV systolic pressure at 30.  She was not felt to have aortic stenosis.  Her blood pressure today is stable on her medical regimen consisting of lisinopril 10 mg and metoprolol 25 mg daily.  Her ECG is stable and demonstrates sinus bradycardia 58 bpm.  She continues to be on rosuvastatin 40 mg for hyperlipidemia.  Most recent LDL cholesterol on January 26, 2020 was 58.  She unfortunately was diagnosed with metastatic non-small cell CA with multiple areas of bone metastases.  She is now on Keytruda infusions every 3 weeks.  She developed low-grade fever sore throat and general body aches and was diagnosed with COVID-19 infection on March 30, 2020 for which she received Regeneron antibiotic infusion on September 7.  She denies any exertional chest pain symptoms.  However at times she does experience a mild chest sensation at night and this may be secondary to her metastatic disease.  She will be following up with oncology in December for additional transfusion.  Cardiac wise, she is stable.  I will see her in 6 months for reevaluation or sooner as needed.   Troy Sine, MD, [facility]  06/26/2020 9:39 AM

## 2020-06-22 ENCOUNTER — Ambulatory Visit: Payer: Medicare Other

## 2020-06-22 ENCOUNTER — Ambulatory Visit: Payer: Medicare Other | Admitting: Oncology

## 2020-06-22 ENCOUNTER — Other Ambulatory Visit: Payer: Medicare Other

## 2020-06-23 ENCOUNTER — Other Ambulatory Visit: Payer: Self-pay | Admitting: Cardiovascular Disease

## 2020-06-23 ENCOUNTER — Ambulatory Visit: Payer: Medicare Other

## 2020-06-23 ENCOUNTER — Other Ambulatory Visit: Payer: Medicare Other

## 2020-06-23 ENCOUNTER — Ambulatory Visit: Payer: Medicare Other | Admitting: Nurse Practitioner

## 2020-06-26 ENCOUNTER — Encounter: Payer: Self-pay | Admitting: Cardiovascular Disease

## 2020-06-27 ENCOUNTER — Other Ambulatory Visit: Payer: Self-pay | Admitting: Oncology

## 2020-06-30 ENCOUNTER — Inpatient Hospital Stay: Payer: Medicare Other | Attending: Oncology

## 2020-06-30 ENCOUNTER — Encounter: Payer: Self-pay | Admitting: Nurse Practitioner

## 2020-06-30 ENCOUNTER — Inpatient Hospital Stay: Payer: Medicare Other

## 2020-06-30 ENCOUNTER — Inpatient Hospital Stay (HOSPITAL_BASED_OUTPATIENT_CLINIC_OR_DEPARTMENT_OTHER): Payer: Medicare Other | Admitting: Nurse Practitioner

## 2020-06-30 ENCOUNTER — Other Ambulatory Visit: Payer: Self-pay

## 2020-06-30 VITALS — BP 151/68 | HR 50 | Temp 97.8°F | Resp 17 | Ht 65.0 in | Wt 181.3 lb

## 2020-06-30 DIAGNOSIS — C3432 Malignant neoplasm of lower lobe, left bronchus or lung: Secondary | ICD-10-CM | POA: Diagnosis present

## 2020-06-30 DIAGNOSIS — Z79899 Other long term (current) drug therapy: Secondary | ICD-10-CM | POA: Diagnosis not present

## 2020-06-30 DIAGNOSIS — C3492 Malignant neoplasm of unspecified part of left bronchus or lung: Secondary | ICD-10-CM

## 2020-06-30 DIAGNOSIS — C7951 Secondary malignant neoplasm of bone: Secondary | ICD-10-CM | POA: Diagnosis not present

## 2020-06-30 DIAGNOSIS — Z5112 Encounter for antineoplastic immunotherapy: Secondary | ICD-10-CM | POA: Insufficient documentation

## 2020-06-30 LAB — CMP (CANCER CENTER ONLY)
ALT: 28 U/L (ref 0–44)
AST: 20 U/L (ref 15–41)
Albumin: 3.9 g/dL (ref 3.5–5.0)
Alkaline Phosphatase: 71 U/L (ref 38–126)
Anion gap: 5 (ref 5–15)
BUN: 30 mg/dL — ABNORMAL HIGH (ref 8–23)
CO2: 25 mmol/L (ref 22–32)
Calcium: 9.9 mg/dL (ref 8.9–10.3)
Chloride: 105 mmol/L (ref 98–111)
Creatinine: 0.92 mg/dL (ref 0.44–1.00)
GFR, Estimated: >60 mL/min (ref >60)
Glucose, Bld: 96 mg/dL (ref 70–99)
Potassium: 4.9 mmol/L (ref 3.5–5.1)
Sodium: 135 mmol/L (ref 135–145)
Total Bilirubin: 0.4 mg/dL (ref 0.3–1.2)
Total Protein: 7 g/dL (ref 6.5–8.1)

## 2020-06-30 LAB — CBC WITH DIFFERENTIAL (CANCER CENTER ONLY)
Abs Immature Granulocytes: 0.01 10*3/uL (ref 0.00–0.07)
Basophils Absolute: 0 10*3/uL (ref 0.0–0.1)
Basophils Relative: 0 %
Eosinophils Absolute: 0 10*3/uL (ref 0.0–0.5)
Eosinophils Relative: 1 %
HCT: 36.2 % (ref 36.0–46.0)
Hemoglobin: 11.8 g/dL — ABNORMAL LOW (ref 12.0–15.0)
Immature Granulocytes: 0 %
Lymphocytes Relative: 24 %
Lymphs Abs: 1.5 10*3/uL (ref 0.7–4.0)
MCH: 30.6 pg (ref 26.0–34.0)
MCHC: 32.6 g/dL (ref 30.0–36.0)
MCV: 93.8 fL (ref 80.0–100.0)
Monocytes Absolute: 0.6 10*3/uL (ref 0.1–1.0)
Monocytes Relative: 9 %
Neutro Abs: 4.1 10*3/uL (ref 1.7–7.7)
Neutrophils Relative %: 66 %
Platelet Count: 171 10*3/uL (ref 150–400)
RBC: 3.86 MIL/uL — ABNORMAL LOW (ref 3.87–5.11)
RDW: 13.9 % (ref 11.5–15.5)
WBC Count: 6.2 10*3/uL (ref 4.0–10.5)
nRBC: 0 % (ref 0.0–0.2)

## 2020-06-30 LAB — TSH: TSH: 0.707 u[IU]/mL (ref 0.308–3.960)

## 2020-06-30 MED ORDER — HEPARIN SOD (PORK) LOCK FLUSH 100 UNIT/ML IV SOLN
500.0000 [IU] | Freq: Once | INTRAVENOUS | Status: AC | PRN
  Administered 2020-06-30: 500 [IU]
  Filled 2020-06-30: qty 5

## 2020-06-30 MED ORDER — SODIUM CHLORIDE 0.9% FLUSH
10.0000 mL | INTRAVENOUS | Status: DC | PRN
  Administered 2020-06-30: 10 mL
  Filled 2020-06-30: qty 10

## 2020-06-30 MED ORDER — SODIUM CHLORIDE 0.9 % IV SOLN
Freq: Once | INTRAVENOUS | Status: AC
  Filled 2020-06-30: qty 250

## 2020-06-30 MED ORDER — SODIUM CHLORIDE 0.9 % IV SOLN
200.0000 mg | Freq: Once | INTRAVENOUS | Status: AC
  Administered 2020-06-30: 200 mg via INTRAVENOUS
  Filled 2020-06-30: qty 8

## 2020-06-30 NOTE — Progress Notes (Signed)
Pt stable at discharge. Ambulatory to lobby

## 2020-06-30 NOTE — Patient Instructions (Signed)
[facility] Discharge Instructions for Patients Receiving Chemotherapy  Today you received the following chemotherapy agents:  Keytruda.  To help prevent nausea and vomiting after your treatment, we encourage you to take your nausea medication as directed.   If you develop nausea and vomiting that is not controlled by your nausea medication, call the clinic.   BELOW ARE SYMPTOMS THAT SHOULD BE REPORTED IMMEDIATELY:  *FEVER GREATER THAN 100.5 F  *CHILLS WITH OR WITHOUT FEVER  NAUSEA AND VOMITING THAT IS NOT CONTROLLED WITH YOUR NAUSEA MEDICATION  *UNUSUAL SHORTNESS OF BREATH  *UNUSUAL BRUISING OR BLEEDING  TENDERNESS IN MOUTH AND THROAT WITH OR WITHOUT PRESENCE OF ULCERS  *URINARY PROBLEMS  *BOWEL PROBLEMS  UNUSUAL RASH Items with * indicate a potential emergency and should be followed up as soon as possible.  Feel free to call the clinic should you have any questions or concerns. The clinic phone number is [phone].  Please show the CHEMO ALERT CARD at check-in to the Emergency Department and triage nurse.    

## 2020-06-30 NOTE — Progress Notes (Signed)
[place] OFFICE PROGRESS NOTE   Diagnosis: Non-small cell lung cancer  INTERVAL HISTORY:   [name] returns as scheduled.  She completed another cycle of Pembrolizumab 06/10/2020.  No rash or diarrhea.  No nausea or vomiting.  No change in pain.  No shortness of breath.  Objective:  Vital signs in last 24 hours:  Blood pressure (!) 151/68, pulse (!) 50, temperature 97.8 F (36.6 C), temperature source Tympanic, resp. rate 17, height _0  (1.651 m), weight 181 lb 4.8 oz (82.2 kg), SpO2 97 %.    HEENT: No thrush or ulcers. Resp: Lungs clear bilaterally. Cardio: Regular rate and rhythm. GI: Abdomen soft and nontender.  No hepatomegaly. Vascular: No leg edema. Skin: No rash. Port-A-Cath without erythema.   Lab Results:  Lab Results  Component Value Date   WBC 6.2 06/30/2020   HGB 11.8 (L) 06/30/2020   HCT 36.2 06/30/2020   MCV 93.8 06/30/2020   PLT 171 06/30/2020   NEUTROABS 4.1 06/30/2020    Imaging:  No results found.  Medications: I have reviewed the patient's current medications.  Assessment/Plan: 1. Non-small cell lung cancer  MRI lumbar spine 04/29/2019-enlarging marrow lesions involving the L1 vertebral body, upper left sacrum and right iliac bone  MRI pelvis 04/29/2019-3.5 cm left iliac bone lesion appears slightly larger; other similar appearing lesions present within the left superior pubic ramus, left superior abdomen acetabulum and upper left sacrum  Kappa free light chains with mild elevation 05/12/2019  CTs10/07/2019-left lower lobe pulmonary mass 3.3 x 3.2 cm; lytic process left iliac bone; spinal lesions; 1.1 cm low-density left kidney lesion; right thyroid enlargement with heterogeneous appearance with potential for multiple discrete lesions  Biopsy left lower lobe lung mass 05/26/2019-poorly differentiated carcinoma; positive for cytokeratin 5/6, p63 and TTF-1, no EGFR, BRAF, ALK,ERBB2,ROS, orNTRKalteration  Cycle 1  carboplatin/Alimta/pembrolizumab 06/06/2019  Cycle 2 carboplatin/Alimta/pembrolizumab 06/27/2019  Cycle 3 carboplatin/Alimta/pembrolizumab 07/18/2019  Cycle 4 carboplatin/Alimta/pembrolizumab1/01/2020  CTs 08/27/2019-significant decrease in size of lobulated mass left lower lobe. Unchanged appearance of subtle bone lesions.  Cycle 5 Alimta/pembrolizumab 08/28/2019  Cycle 6 Alimta/pembrolizumab 09/18/2019  Cycle 7 Alimta/pembrolizumab 10/09/2019  Cycle 8 Alimta/pembrolizumab 10/30/2019  Cycle 9 Alimta/pembrolizumab 11/20/2019  CTs 12/09/2019-no evidence of disease progression, left lower lobe nodule slightly decreased in size, stable L1, left sacral, and left pubic ramus metastases  Cycle 10 Alimta/pembrolizumab 12/11/2019  Cycle 11 pembrolizumab alone 01/02/2020 (Alimta held due to edema, tenderness, erythema at the lower legs)  Cycle 12 pembrolizumab 01/23/2020  Cycle 13 pembrolizumab 02/12/2020  Cycle 14 pembrolizumab 03/04/2020  CTs 03/18/2020-stable left lower lobe lesion, mild sclerosis at the superior endplate of L1 that was previously hypermetabolic, stable small left upper sacral lucent lesion, previous left superior pubic ramus lesion is occult on the CT, CT head negative for malignancy  Cycle 15 pembrolizumab 03/25/2020  Cycle 16 pembrolizumab 04/21/2020  Cycle 17 pembrolizumab 05/17/2020  05/19/2020 bone scan-no definite abnormalities to suggest osseous metastases.  Areas of concern on prior PET-CT involving left iliac bone and L1 vertebral body showed no abnormalities on the current study  Cycle 18 Pembrolizumab 06/10/2020  Cycle 19 Pembrolizumab 06/30/2020 2. Pain secondary to #1 3. Chronic back pain 4. Type 2 diabetes 5. Essential hypertension 6. CAD 7. Hyperlipidemia 8. Family history significant for multiple members with breast cancer 9. Grade 1 skin rash 07/18/2019 likely related to immunotherapy. Topical steroid cream as needed. 10. E. coli urinary tract  infection 07/14/2019. Completed cephalexin. 11. Edema/tenderness at the right greater than left ankle 10/21/2019-etiology  unclear, potentially related to systemic therapy or an infection, doxycycline prescribed-improved 10/23/2019; marked improvement 11/20/2019; at office visit 01/02/2020 she reports worsening of lower extremity edema, pain/tenderness, erythema 3 to 4 days following each treatment. Alimta held 01/02/2020. Referral to dermatology. 12. COVID-19 infection 03/30/2020, monoclonal antibody therapy 04/06/2020    Disposition: [name] appears stable.  She has completed 18 cycles of pembrolizumab.  She is tolerating treatment well.  Plan to proceed with cycle 19 today as scheduled.  She will have restaging CTs prior to her next visit.  We reviewed the CBC and chemistry panel from today.  Labs adequate to proceed with treatment.  She will return for lab, follow-up, pembrolizumab in 3 weeks.    Ned Card ANP/GNP-BC   06/30/2020  10:16 AM

## 2020-07-01 ENCOUNTER — Telehealth: Payer: Self-pay | Admitting: Nurse Practitioner

## 2020-07-01 ENCOUNTER — Ambulatory Visit: Payer: Medicare Other

## 2020-07-01 NOTE — Telephone Encounter (Signed)
Scheduled appointments per 12/1 los. Will have updated calendar printed for patient at next visit.

## 2020-07-05 ENCOUNTER — Encounter: Payer: Self-pay | Admitting: Oncology

## 2020-07-16 ENCOUNTER — Ambulatory Visit (HOSPITAL_COMMUNITY)
Admission: RE | Admit: 2020-07-16 | Discharge: 2020-07-16 | Disposition: A | Discharge status: Home / Self Care | Payer: Medicare Other | Source: Ambulatory Visit | Attending: Nurse Practitioner | Admitting: Nurse Practitioner

## 2020-07-16 ENCOUNTER — Other Ambulatory Visit: Payer: Self-pay

## 2020-07-16 ENCOUNTER — Encounter (HOSPITAL_COMMUNITY): Payer: Self-pay

## 2020-07-16 DIAGNOSIS — C3492 Malignant neoplasm of unspecified part of left bronchus or lung: Secondary | ICD-10-CM | POA: Insufficient documentation

## 2020-07-16 MED ORDER — IOHEXOL 300 MG/ML  SOLN
100.0000 mL | Freq: Once | INTRAMUSCULAR | Status: AC | PRN
  Administered 2020-07-16: 100 mL via INTRAVENOUS

## 2020-07-16 MED ORDER — SODIUM CHLORIDE (PF) 0.9 % IJ SOLN
INTRAMUSCULAR | Status: AC
  Filled 2020-07-16: qty 50

## 2020-07-18 ENCOUNTER — Other Ambulatory Visit: Payer: Self-pay | Admitting: Oncology

## 2020-07-21 ENCOUNTER — Other Ambulatory Visit: Payer: Self-pay

## 2020-07-21 ENCOUNTER — Inpatient Hospital Stay (HOSPITAL_BASED_OUTPATIENT_CLINIC_OR_DEPARTMENT_OTHER): Payer: Medicare Other | Admitting: Oncology

## 2020-07-21 ENCOUNTER — Inpatient Hospital Stay: Payer: Medicare Other

## 2020-07-21 VITALS — BP 164/72 | HR 63 | Temp 97.3°F | Resp 17 | Ht 65.0 in | Wt 181.4 lb

## 2020-07-21 DIAGNOSIS — C3492 Malignant neoplasm of unspecified part of left bronchus or lung: Secondary | ICD-10-CM

## 2020-07-21 DIAGNOSIS — Z95828 Presence of other vascular implants and grafts: Secondary | ICD-10-CM

## 2020-07-21 DIAGNOSIS — Z5112 Encounter for antineoplastic immunotherapy: Secondary | ICD-10-CM | POA: Diagnosis not present

## 2020-07-21 LAB — CBC WITH DIFFERENTIAL (CANCER CENTER ONLY)
Abs Immature Granulocytes: 0.01 10*3/uL (ref 0.00–0.07)
Basophils Absolute: 0 10*3/uL (ref 0.0–0.1)
Basophils Relative: 0 %
Eosinophils Absolute: 0 10*3/uL (ref 0.0–0.5)
Eosinophils Relative: 1 %
HCT: 36.1 % (ref 36.0–46.0)
Hemoglobin: 11.8 g/dL — ABNORMAL LOW (ref 12.0–15.0)
Immature Granulocytes: 0 %
Lymphocytes Relative: 30 %
Lymphs Abs: 1.4 10*3/uL (ref 0.7–4.0)
MCH: 31.1 pg (ref 26.0–34.0)
MCHC: 32.7 g/dL (ref 30.0–36.0)
MCV: 95 fL (ref 80.0–100.0)
Monocytes Absolute: 0.4 10*3/uL (ref 0.1–1.0)
Monocytes Relative: 9 %
Neutro Abs: 2.7 10*3/uL (ref 1.7–7.7)
Neutrophils Relative %: 60 %
Platelet Count: 148 10*3/uL — ABNORMAL LOW (ref 150–400)
RBC: 3.8 MIL/uL — ABNORMAL LOW (ref 3.87–5.11)
RDW: 14 % (ref 11.5–15.5)
WBC Count: 4.6 10*3/uL (ref 4.0–10.5)
nRBC: 0 % (ref 0.0–0.2)

## 2020-07-21 LAB — CMP (CANCER CENTER ONLY)
ALT: 25 U/L (ref 0–44)
AST: 20 U/L (ref 15–41)
Albumin: 3.9 g/dL (ref 3.5–5.0)
Alkaline Phosphatase: 57 U/L (ref 38–126)
Anion gap: 7 (ref 5–15)
BUN: 27 mg/dL — ABNORMAL HIGH (ref 8–23)
CO2: 25 mmol/L (ref 22–32)
Calcium: 9.7 mg/dL (ref 8.9–10.3)
Chloride: 107 mmol/L (ref 98–111)
Creatinine: 0.96 mg/dL (ref 0.44–1.00)
GFR, Estimated: 59 mL/min — ABNORMAL LOW (ref >60)
Glucose, Bld: 97 mg/dL (ref 70–99)
Potassium: 4.5 mmol/L (ref 3.5–5.1)
Sodium: 139 mmol/L (ref 135–145)
Total Bilirubin: 0.4 mg/dL (ref 0.3–1.2)
Total Protein: 6.9 g/dL (ref 6.5–8.1)

## 2020-07-21 MED ORDER — SODIUM CHLORIDE 0.9% FLUSH
10.0000 mL | INTRAVENOUS | Status: DC | PRN
  Administered 2020-07-21: 10 mL
  Filled 2020-07-21: qty 10

## 2020-07-21 MED ORDER — SODIUM CHLORIDE 0.9 % IV SOLN
200.0000 mg | Freq: Once | INTRAVENOUS | Status: AC
  Administered 2020-07-21: 200 mg via INTRAVENOUS
  Filled 2020-07-21: qty 8

## 2020-07-21 MED ORDER — SODIUM CHLORIDE 0.9 % IV SOLN
Freq: Once | INTRAVENOUS | Status: AC
  Filled 2020-07-21: qty 250

## 2020-07-21 MED ORDER — SODIUM CHLORIDE 0.9 % IV SOLN
Freq: Once | INTRAVENOUS | Status: DC
  Filled 2020-07-21: qty 250

## 2020-07-21 MED ORDER — HEPARIN SOD (PORK) LOCK FLUSH 100 UNIT/ML IV SOLN
500.0000 [IU] | Freq: Once | INTRAVENOUS | Status: AC | PRN
  Administered 2020-07-21: 500 [IU]
  Filled 2020-07-21: qty 5

## 2020-07-21 MED ORDER — SODIUM CHLORIDE 0.9% FLUSH
10.0000 mL | Freq: Once | INTRAVENOUS | Status: AC
  Administered 2020-07-21: 10 mL
  Filled 2020-07-21: qty 10

## 2020-07-21 NOTE — Progress Notes (Signed)
[place] OFFICE PROGRESS NOTE   Diagnosis: Non-small cell lung cancer  INTERVAL HISTORY:   [name] completed another cycle of pembrolizumab on 06/30/2021.  No rash or diarrhea.  She reports feeling well.  She continues to have back and hip pain.  She reports transient relief of back pain when she received a steroid injection.  No other complaint.  Objective:  Vital signs in last 24 hours:  Blood pressure (!) 164/72, pulse 63, temperature (!) 97.3 F (36.3 C), temperature source Tympanic, resp. rate 17, height '5\' 5"'  (1.651 m), weight 181 lb 6.4 oz (82.3 kg), SpO2 95 %.    Resp: Lungs clear bilaterally Cardio: Regular rate and rhythm GI: No hepatosplenomegaly Vascular: No leg edema  Skin: No rash  Portacath/PICC-without erythema  Lab Results:  Lab Results  Component Value Date   WBC 4.6 07/21/2020   HGB 11.8 (L) 07/21/2020   HCT 36.1 07/21/2020   MCV 95.0 07/21/2020   PLT 148 (L) 07/21/2020   NEUTROABS 2.7 07/21/2020    CMP  Lab Results  Component Value Date   NA 139 07/21/2020   K 4.5 07/21/2020   CL 107 07/21/2020   CO2 25 07/21/2020   GLUCOSE 97 07/21/2020   BUN 27 (H) 07/21/2020   CREATININE 0.96 07/21/2020   CALCIUM 9.7 07/21/2020   PROT 6.9 07/21/2020   ALBUMIN 3.9 07/21/2020   AST 20 07/21/2020   ALT 25 07/21/2020   ALKPHOS 57 07/21/2020   BILITOT 0.4 07/21/2020   GFRNONAA 59 (L) 07/21/2020   GFRAA >60 04/21/2020     Medications: I have reviewed the patient's current medications.   Assessment/Plan: 1. Non-small cell lung cancer  MRI lumbar spine 04/29/2019-enlarging marrow lesions involving the L1 vertebral body, upper left sacrum and right iliac bone  MRI pelvis 04/29/2019-3.5 cm left iliac bone lesion appears slightly larger; other similar appearing lesions present within the left superior pubic ramus, left superior abdomen acetabulum and upper left sacrum  Kappa free light chains with mild elevation  05/12/2019  CTs10/07/2019-left lower lobe pulmonary mass 3.3 x 3.2 cm; lytic process left iliac bone; spinal lesions; 1.1 cm low-density left kidney lesion; right thyroid enlargement with heterogeneous appearance with potential for multiple discrete lesions  Biopsy left lower lobe lung mass 05/26/2019-poorly differentiated carcinoma; positive for cytokeratin 5/6, p63 and TTF-1, no EGFR, BRAF, ALK,ERBB2,ROS, orNTRKalteration  Cycle 1 carboplatin/Alimta/pembrolizumab 06/06/2019  Cycle 2 carboplatin/Alimta/pembrolizumab 06/27/2019  Cycle 3 carboplatin/Alimta/pembrolizumab 07/18/2019  Cycle 4 carboplatin/Alimta/pembrolizumab1/01/2020  CTs 08/27/2019-significant decrease in size of lobulated mass left lower lobe. Unchanged appearance of subtle bone lesions.  Cycle 5 Alimta/pembrolizumab 08/28/2019  Cycle 6 Alimta/pembrolizumab 09/18/2019  Cycle 7 Alimta/pembrolizumab 10/09/2019  Cycle 8 Alimta/pembrolizumab 10/30/2019  Cycle 9 Alimta/pembrolizumab 11/20/2019  CTs 12/09/2019-no evidence of disease progression, left lower lobe nodule slightly decreased in size, stable L1, left sacral, and left pubic ramus metastases  Cycle 10 Alimta/pembrolizumab 12/11/2019  Cycle 11 pembrolizumab alone 01/02/2020 (Alimta held due to edema, tenderness, erythema at the lower legs)  Cycle 12 pembrolizumab 01/23/2020  Cycle 13 pembrolizumab 02/12/2020  Cycle 14 pembrolizumab 03/04/2020  CTs 03/18/2020-stable left lower lobe lesion, mild sclerosis at the superior endplate of L1 that was previously hypermetabolic, stable small left upper sacral lucent lesion, previous left superior pubic ramus lesion is occult on the CT, CT head negative for malignancy  Cycle 15 pembrolizumab 03/25/2020  Cycle 16 pembrolizumab 04/21/2020  Cycle 17 pembrolizumab 05/17/2020  05/19/2020 bone scan-no definite abnormalities to suggest osseous metastases.  Areas of concern  on prior PET-CT involving left iliac bone and L1 vertebral  body showed no abnormalities on the current study  Cycle 18 Pembrolizumab 06/10/2020  Cycle 19 Pembrolizumab 06/30/2020  CTs 07/16/2020-stable left lower lobe nodule, stable faint superior L1 vertebral lesion, no evidence of disease progression  cycle 20 pembrolizumab 07/21/2020 2. Pain secondary to #1, improved 3. Chronic back pain 4. Type 2 diabetes 5. Essential hypertension 6. CAD 7. Hyperlipidemia 8. Family history significant for multiple members with breast cancer 9. Grade 1 skin rash 07/18/2019 likely related to immunotherapy. Topical steroid cream as needed. 10. E. coli urinary tract infection 07/14/2019. Completed cephalexin. 11. Edema/tenderness at the right greater than left ankle 10/21/2019-etiology unclear, potentially related to systemic therapy or an infection, doxycycline prescribed-improved 10/23/2019; marked improvement 11/20/2019; at office visit 01/02/2020 she reports worsening of lower extremity edema, pain/tenderness, erythema 3 to 4 days following each treatment. Alimta held 01/02/2020. Referral to dermatology. 12. COVID-19 infection 03/30/2020, monoclonal antibody therapy 04/06/2020     Disposition: [name] appears well.  There is no clinical or radiologic evidence of disease progression.  The plan is to continue pembrolizumab.  I suspect the back and hip discomfort are related to degenerative changes.  She will complete another treatment with pembrolizumab today.  [name] will return for an office visit and pembrolizumab in 3 weeks.  [name], MD  07/21/2020  11:19 AM

## 2020-07-21 NOTE — Patient Instructions (Signed)

## 2020-07-21 NOTE — Patient Instructions (Signed)
[facility] Discharge Instructions for Patients Receiving Chemotherapy  Today you received the following chemotherapy agents:  Keytruda.  To help prevent nausea and vomiting after your treatment, we encourage you to take your nausea medication as directed.   If you develop nausea and vomiting that is not controlled by your nausea medication, call the clinic.   BELOW ARE SYMPTOMS THAT SHOULD BE REPORTED IMMEDIATELY:  *FEVER GREATER THAN 100.5 F  *CHILLS WITH OR WITHOUT FEVER  NAUSEA AND VOMITING THAT IS NOT CONTROLLED WITH YOUR NAUSEA MEDICATION  *UNUSUAL SHORTNESS OF BREATH  *UNUSUAL BRUISING OR BLEEDING  TENDERNESS IN MOUTH AND THROAT WITH OR WITHOUT PRESENCE OF ULCERS  *URINARY PROBLEMS  *BOWEL PROBLEMS  UNUSUAL RASH Items with * indicate a potential emergency and should be followed up as soon as possible.  Feel free to call the clinic should you have any questions or concerns. The clinic phone number is [phone].  Please show the CHEMO ALERT CARD at check-in to the Emergency Department and triage nurse.    

## 2020-08-09 ENCOUNTER — Other Ambulatory Visit: Payer: Self-pay | Admitting: Oncology

## 2020-08-11 ENCOUNTER — Encounter: Payer: Self-pay | Admitting: Nurse Practitioner

## 2020-08-11 ENCOUNTER — Inpatient Hospital Stay (HOSPITAL_BASED_OUTPATIENT_CLINIC_OR_DEPARTMENT_OTHER): Payer: Medicare Other | Admitting: Nurse Practitioner

## 2020-08-11 ENCOUNTER — Inpatient Hospital Stay: Payer: Medicare Other

## 2020-08-11 ENCOUNTER — Encounter: Payer: Self-pay | Admitting: Oncology

## 2020-08-11 ENCOUNTER — Inpatient Hospital Stay: Payer: Medicare Other | Attending: Audiology

## 2020-08-11 ENCOUNTER — Other Ambulatory Visit: Payer: Self-pay

## 2020-08-11 VITALS — BP 166/68 | HR 63 | Temp 97.6°F | Resp 16 | Ht 65.0 in | Wt 180.0 lb

## 2020-08-11 DIAGNOSIS — Z79899 Other long term (current) drug therapy: Secondary | ICD-10-CM | POA: Diagnosis not present

## 2020-08-11 DIAGNOSIS — C3492 Malignant neoplasm of unspecified part of left bronchus or lung: Secondary | ICD-10-CM

## 2020-08-11 DIAGNOSIS — Z5112 Encounter for antineoplastic immunotherapy: Secondary | ICD-10-CM | POA: Insufficient documentation

## 2020-08-11 DIAGNOSIS — C3432 Malignant neoplasm of lower lobe, left bronchus or lung: Secondary | ICD-10-CM | POA: Diagnosis present

## 2020-08-11 DIAGNOSIS — Z95828 Presence of other vascular implants and grafts: Secondary | ICD-10-CM

## 2020-08-11 LAB — CMP (CANCER CENTER ONLY)
ALT: 20 U/L (ref 0–44)
AST: 18 U/L (ref 15–41)
Albumin: 3.9 g/dL (ref 3.5–5.0)
Alkaline Phosphatase: 56 U/L (ref 38–126)
Anion gap: 7 (ref 5–15)
BUN: 19 mg/dL (ref 8–23)
CO2: 26 mmol/L (ref 22–32)
Calcium: 9.8 mg/dL (ref 8.9–10.3)
Chloride: 105 mmol/L (ref 98–111)
Creatinine: 0.96 mg/dL (ref 0.44–1.00)
GFR, Estimated: 59 mL/min — ABNORMAL LOW (ref >60)
Glucose, Bld: 107 mg/dL — ABNORMAL HIGH (ref 70–99)
Potassium: 4.5 mmol/L (ref 3.5–5.1)
Sodium: 138 mmol/L (ref 135–145)
Total Bilirubin: 0.4 mg/dL (ref 0.3–1.2)
Total Protein: 7.1 g/dL (ref 6.5–8.1)

## 2020-08-11 LAB — CBC WITH DIFFERENTIAL (CANCER CENTER ONLY)
Abs Immature Granulocytes: 0.01 10*3/uL (ref 0.00–0.07)
Basophils Absolute: 0 10*3/uL (ref 0.0–0.1)
Basophils Relative: 0 %
Eosinophils Absolute: 0 10*3/uL (ref 0.0–0.5)
Eosinophils Relative: 1 %
HCT: 36.4 % (ref 36.0–46.0)
Hemoglobin: 11.8 g/dL — ABNORMAL LOW (ref 12.0–15.0)
Immature Granulocytes: 0 %
Lymphocytes Relative: 24 %
Lymphs Abs: 1.2 10*3/uL (ref 0.7–4.0)
MCH: 30.7 pg (ref 26.0–34.0)
MCHC: 32.4 g/dL (ref 30.0–36.0)
MCV: 94.8 fL (ref 80.0–100.0)
Monocytes Absolute: 0.4 10*3/uL (ref 0.1–1.0)
Monocytes Relative: 9 %
Neutro Abs: 3.3 10*3/uL (ref 1.7–7.7)
Neutrophils Relative %: 66 %
Platelet Count: 155 10*3/uL (ref 150–400)
RBC: 3.84 MIL/uL — ABNORMAL LOW (ref 3.87–5.11)
RDW: 13.7 % (ref 11.5–15.5)
WBC Count: 5 10*3/uL (ref 4.0–10.5)
nRBC: 0 % (ref 0.0–0.2)

## 2020-08-11 LAB — TSH: TSH: 0.411 u[IU]/mL (ref 0.308–3.960)

## 2020-08-11 MED ORDER — SODIUM CHLORIDE 0.9 % IV SOLN
200.0000 mg | Freq: Once | INTRAVENOUS | Status: AC
  Administered 2020-08-11: 200 mg via INTRAVENOUS
  Filled 2020-08-11: qty 8

## 2020-08-11 MED ORDER — SODIUM CHLORIDE 0.9% FLUSH
10.0000 mL | INTRAVENOUS | Status: DC | PRN
  Administered 2020-08-11: 10 mL
  Filled 2020-08-11: qty 10

## 2020-08-11 MED ORDER — HEPARIN SOD (PORK) LOCK FLUSH 100 UNIT/ML IV SOLN
500.0000 [IU] | Freq: Once | INTRAVENOUS | Status: AC | PRN
  Administered 2020-08-11: 500 [IU]
  Filled 2020-08-11: qty 5

## 2020-08-11 MED ORDER — SODIUM CHLORIDE 0.9 % IV SOLN
Freq: Once | INTRAVENOUS | Status: AC
  Filled 2020-08-11: qty 250

## 2020-08-11 MED ORDER — SODIUM CHLORIDE 0.9% FLUSH
10.0000 mL | Freq: Once | INTRAVENOUS | Status: AC
  Administered 2020-08-11: 10 mL
  Filled 2020-08-11: qty 10

## 2020-08-11 NOTE — Patient Instructions (Signed)
[facility] Discharge Instructions for Patients Receiving Chemotherapy  Today you received the following chemotherapy agents Beryle Flock  To help prevent nausea and vomiting after your treatment, we encourage you to take your nausea medication as directed   If you develop nausea and vomiting that is not controlled by your nausea medication, call the clinic.   BELOW ARE SYMPTOMS THAT SHOULD BE REPORTED IMMEDIATELY:  *FEVER GREATER THAN 100.5 F  *CHILLS WITH OR WITHOUT FEVER  NAUSEA AND VOMITING THAT IS NOT CONTROLLED WITH YOUR NAUSEA MEDICATION  *UNUSUAL SHORTNESS OF BREATH  *UNUSUAL BRUISING OR BLEEDING  TENDERNESS IN MOUTH AND THROAT WITH OR WITHOUT PRESENCE OF ULCERS  *URINARY PROBLEMS  *BOWEL PROBLEMS  UNUSUAL RASH Items with * indicate a potential emergency and should be followed up as soon as possible.  Feel free to call the clinic should you have any questions or concerns. The clinic phone number is [phone].  Please show the [facility] at check-in to the Emergency Department and triage nurse.

## 2020-08-11 NOTE — Patient Instructions (Signed)

## 2020-08-11 NOTE — Progress Notes (Signed)
[facility] OFFICE PROGRESS NOTE   Diagnosis: Non-small cell lung cancer  INTERVAL HISTORY:   Ms. Woolen returns as scheduled.  She completed another cycle of Pembrolizumab 07/21/2020.  No rash or diarrhea.  She denies nausea/vomiting.  She continues to have a good appetite.  Continued back and neck pain, also hip.  She takes Tylenol, Aleve and ibuprofen as needed.  She has stable mild dyspnea on exertion.  No cough.  No fever.  Objective:  Vital signs in last 24 hours:  Blood pressure (!) 166/68, pulse 63, temperature 97.6 F (36.4 C), temperature source Tympanic, resp. rate 16, height _0  (1.651 m), weight 180 lb (81.6 kg), SpO2 95 %.    HEENT: No thrush or ulcers. Resp: Lungs clear bilaterally. Cardio: Regular rate and rhythm. GI: Abdomen soft and nontender.  No hepatomegaly. Vascular: No leg edema. Skin: No rash. Port-A-Cath without erythema.   Lab Results:  Lab Results  Component Value Date   WBC 5.0 08/11/2020   HGB 11.8 (L) 08/11/2020   HCT 36.4 08/11/2020   MCV 94.8 08/11/2020   PLT 155 08/11/2020   NEUTROABS 3.3 08/11/2020    Imaging:  No results found.  Medications: I have reviewed the patient's current medications.  Assessment/Plan: 1. Non-small cell lung cancer  MRI lumbar spine 04/29/2019-enlarging marrow lesions involving the L1 vertebral body, upper left sacrum and right iliac bone  MRI pelvis 04/29/2019-3.5 cm left iliac bone lesion appears slightly larger; other similar appearing lesions present within the left superior pubic ramus, left superior abdomen acetabulum and upper left sacrum  Kappa free light chains with mild elevation 05/12/2019  CTs10/07/2019-left lower lobe pulmonary mass 3.3 x 3.2 cm; lytic process left iliac bone; spinal lesions; 1.1 cm low-density left kidney lesion; right thyroid enlargement with heterogeneous appearance with potential for multiple discrete lesions  Biopsy left lower lobe lung mass  05/26/2019-poorly differentiated carcinoma; positive for cytokeratin 5/6, p63 and TTF-1, no EGFR, BRAF, ALK,ERBB2,ROS, orNTRKalteration  Cycle 1 carboplatin/Alimta/pembrolizumab 06/06/2019  Cycle 2 carboplatin/Alimta/pembrolizumab 06/27/2019  Cycle 3 carboplatin/Alimta/pembrolizumab 07/18/2019  Cycle 4 carboplatin/Alimta/pembrolizumab1/01/2020  CTs 08/27/2019-significant decrease in size of lobulated mass left lower lobe. Unchanged appearance of subtle bone lesions.  Cycle 5 Alimta/pembrolizumab 08/28/2019  Cycle 6 Alimta/pembrolizumab 09/18/2019  Cycle 7 Alimta/pembrolizumab 10/09/2019  Cycle 8 Alimta/pembrolizumab 10/30/2019  Cycle 9 Alimta/pembrolizumab 11/20/2019  CTs 12/09/2019-no evidence of disease progression, left lower lobe nodule slightly decreased in size, stable L1, left sacral, and left pubic ramus metastases  Cycle 10 Alimta/pembrolizumab 12/11/2019  Cycle 11 pembrolizumab alone 01/02/2020 (Alimta held due to edema, tenderness, erythema at the lower legs)  Cycle 12 pembrolizumab 01/23/2020  Cycle 13 pembrolizumab 02/12/2020  Cycle 14 pembrolizumab 03/04/2020  CTs 03/18/2020-stable left lower lobe lesion, mild sclerosis at the superior endplate of L1 that was previously hypermetabolic, stable small left upper sacral lucent lesion, previous left superior pubic ramus lesion is occult on the CT, CT head negative for malignancy  Cycle 15 pembrolizumab 03/25/2020  Cycle 16 pembrolizumab 04/21/2020  Cycle 17 pembrolizumab 05/17/2020  05/19/2020 bone scan-no definite abnormalities to suggest osseous metastases. Areas of concern on prior PET-CT involving left iliac bone and L1 vertebral body showed no abnormalities on the current study  Cycle 18 Pembrolizumab 06/10/2020  Cycle 19 Pembrolizumab 06/30/2020  CTs 07/16/2020-stable left lower lobe nodule, stable faint superior L1 vertebral lesion, no evidence of disease progression  Cycle 20 pembrolizumab 07/21/2020  Cycle 21  Pembrolizumab 08/11/2020 2. Pain secondary to #1, improved 3. Chronic back pain 4. Type  2 diabetes 5. Essential hypertension 6. CAD 7. Hyperlipidemia 8. Family history significant for multiple members with breast cancer 9. Grade 1 skin rash 07/18/2019 likely related to immunotherapy. Topical steroid cream as needed. 10. E. coli urinary tract infection 07/14/2019. Completed cephalexin. 11. Edema/tenderness at the right greater than left ankle 10/21/2019-etiology unclear, potentially related to systemic therapy or an infection, doxycycline prescribed-improved 10/23/2019; marked improvement 11/20/2019; at office visit 01/02/2020 she reports worsening of lower extremity edema, pain/tenderness, erythema 3 to 4 days following each treatment. Alimta held 01/02/2020. Referral to dermatology. 12. COVID-19 infection 03/30/2020, monoclonal antibody therapy 04/06/2020   Disposition: Ms. Cohrs appears unchanged.  She is on active treatment with Pembrolizumab every 3 weeks.  There is no clinical evidence of disease progression.  Plan to continue the same.  The back and hip pain may be due to degenerative changes.  We will evaluate further if pain medication requirements increase.  We reviewed the CBC from today.  Counts adequate to proceed.  She will return for lab, follow-up, Pembrolizumab in 3 weeks.  We are available to see her sooner if needed.    Ned Card ANP/GNP-BC   08/11/2020  2:42 PM

## 2020-08-11 NOTE — Progress Notes (Signed)
Pt was re enrolled w/ the Patient Access Network for [facility] for $4,700 from 09/07/20 to 09/06/21.

## 2020-08-12 ENCOUNTER — Telehealth: Payer: Self-pay | Admitting: Nurse Practitioner

## 2020-08-12 NOTE — Telephone Encounter (Signed)
Scheduled appointments per 1/12 los. Spoke to patient who is aware of appointments date and times.  

## 2020-08-26 ENCOUNTER — Other Ambulatory Visit: Payer: Self-pay | Admitting: Oncology

## 2020-09-01 ENCOUNTER — Inpatient Hospital Stay: Payer: Medicare Other | Attending: Oncology

## 2020-09-01 ENCOUNTER — Inpatient Hospital Stay: Payer: Medicare Other

## 2020-09-01 ENCOUNTER — Other Ambulatory Visit: Payer: Self-pay

## 2020-09-01 ENCOUNTER — Inpatient Hospital Stay (HOSPITAL_BASED_OUTPATIENT_CLINIC_OR_DEPARTMENT_OTHER): Payer: Medicare Other | Admitting: Oncology

## 2020-09-01 VITALS — BP 142/72 | HR 60 | Temp 97.6°F | Resp 18 | Ht 65.0 in | Wt 180.7 lb

## 2020-09-01 DIAGNOSIS — Z95828 Presence of other vascular implants and grafts: Secondary | ICD-10-CM

## 2020-09-01 DIAGNOSIS — C3432 Malignant neoplasm of lower lobe, left bronchus or lung: Secondary | ICD-10-CM | POA: Diagnosis present

## 2020-09-01 DIAGNOSIS — C3492 Malignant neoplasm of unspecified part of left bronchus or lung: Secondary | ICD-10-CM

## 2020-09-01 DIAGNOSIS — C7951 Secondary malignant neoplasm of bone: Secondary | ICD-10-CM | POA: Diagnosis not present

## 2020-09-01 DIAGNOSIS — Z5112 Encounter for antineoplastic immunotherapy: Secondary | ICD-10-CM | POA: Diagnosis present

## 2020-09-01 DIAGNOSIS — Z79899 Other long term (current) drug therapy: Secondary | ICD-10-CM | POA: Diagnosis not present

## 2020-09-01 LAB — CBC WITH DIFFERENTIAL (CANCER CENTER ONLY)
Abs Immature Granulocytes: 0.02 10*3/uL (ref 0.00–0.07)
Basophils Absolute: 0 10*3/uL (ref 0.0–0.1)
Basophils Relative: 0 %
Eosinophils Absolute: 0 10*3/uL (ref 0.0–0.5)
Eosinophils Relative: 1 %
HCT: 35.3 % — ABNORMAL LOW (ref 36.0–46.0)
Hemoglobin: 11.4 g/dL — ABNORMAL LOW (ref 12.0–15.0)
Immature Granulocytes: 1 %
Lymphocytes Relative: 26 %
Lymphs Abs: 1.1 10*3/uL (ref 0.7–4.0)
MCH: 31.1 pg (ref 26.0–34.0)
MCHC: 32.3 g/dL (ref 30.0–36.0)
MCV: 96.2 fL (ref 80.0–100.0)
Monocytes Absolute: 0.5 10*3/uL (ref 0.1–1.0)
Monocytes Relative: 11 %
Neutro Abs: 2.6 10*3/uL (ref 1.7–7.7)
Neutrophils Relative %: 61 %
Platelet Count: 149 10*3/uL — ABNORMAL LOW (ref 150–400)
RBC: 3.67 MIL/uL — ABNORMAL LOW (ref 3.87–5.11)
RDW: 13.8 % (ref 11.5–15.5)
WBC Count: 4.2 10*3/uL (ref 4.0–10.5)
nRBC: 0 % (ref 0.0–0.2)

## 2020-09-01 LAB — CMP (CANCER CENTER ONLY)
ALT: 17 U/L (ref 0–44)
AST: 17 U/L (ref 15–41)
Albumin: 3.8 g/dL (ref 3.5–5.0)
Alkaline Phosphatase: 62 U/L (ref 38–126)
Anion gap: 6 (ref 5–15)
BUN: 26 mg/dL — ABNORMAL HIGH (ref 8–23)
CO2: 26 mmol/L (ref 22–32)
Calcium: 9.7 mg/dL (ref 8.9–10.3)
Chloride: 104 mmol/L (ref 98–111)
Creatinine: 0.9 mg/dL (ref 0.44–1.00)
GFR, Estimated: >60 mL/min (ref >60)
Glucose, Bld: 116 mg/dL — ABNORMAL HIGH (ref 70–99)
Potassium: 4.4 mmol/L (ref 3.5–5.1)
Sodium: 136 mmol/L (ref 135–145)
Total Bilirubin: 0.4 mg/dL (ref 0.3–1.2)
Total Protein: 6.9 g/dL (ref 6.5–8.1)

## 2020-09-01 MED ORDER — SODIUM CHLORIDE 0.9% FLUSH
10.0000 mL | Freq: Once | INTRAVENOUS | Status: AC
  Administered 2020-09-01: 10 mL
  Filled 2020-09-01: qty 10

## 2020-09-01 MED ORDER — SODIUM CHLORIDE 0.9 % IV SOLN
200.0000 mg | Freq: Once | INTRAVENOUS | Status: AC
  Administered 2020-09-01: 200 mg via INTRAVENOUS
  Filled 2020-09-01: qty 8

## 2020-09-01 MED ORDER — SODIUM CHLORIDE 0.9% FLUSH
10.0000 mL | INTRAVENOUS | Status: DC | PRN
  Administered 2020-09-01: 10 mL
  Filled 2020-09-01: qty 10

## 2020-09-01 MED ORDER — SODIUM CHLORIDE 0.9 % IV SOLN
Freq: Once | INTRAVENOUS | Status: AC
  Filled 2020-09-01: qty 250

## 2020-09-01 MED ORDER — HEPARIN SOD (PORK) LOCK FLUSH 100 UNIT/ML IV SOLN
500.0000 [IU] | Freq: Once | INTRAVENOUS | Status: AC | PRN
  Administered 2020-09-01: 500 [IU]
  Filled 2020-09-01: qty 5

## 2020-09-01 NOTE — Patient Instructions (Signed)

## 2020-09-01 NOTE — Progress Notes (Signed)
[place] OFFICE PROGRESS NOTE   Diagnosis: Non-small cell lung cancer  INTERVAL HISTORY:   [name] returns as scheduled.  She was last treated with pembrolizumab on 08/11/2020.  No rash or diarrhea.  Stable back pain.  She feels well.  Objective:  Vital signs in last 24 hours:  Blood pressure (!) 142/72, pulse 60, temperature 97.6 F (36.4 C), temperature source Tympanic, resp. rate 18, height _0  (1.651 m), weight 180 lb 11.2 oz (82 kg), SpO2 96 %.  Resp: Lungs clear bilaterally Cardio: Regular rate and rhythm GI: No hepatosplenomegaly Vascular: No leg edema  Skin: No rash  Portacath/PICC-without erythema  Lab Results:  Lab Results  Component Value Date   WBC 4.2 09/01/2020   HGB 11.4 (L) 09/01/2020   HCT 35.3 (L) 09/01/2020   MCV 96.2 09/01/2020   PLT 149 (L) 09/01/2020   NEUTROABS 2.6 09/01/2020    CMP  Lab Results  Component Value Date   NA 136 09/01/2020   K 4.4 09/01/2020   CL 104 09/01/2020   CO2 26 09/01/2020   GLUCOSE 116 (H) 09/01/2020   BUN 26 (H) 09/01/2020   CREATININE 0.90 09/01/2020   CALCIUM 9.7 09/01/2020   PROT 6.9 09/01/2020   ALBUMIN 3.8 09/01/2020   AST 17 09/01/2020   ALT 17 09/01/2020   ALKPHOS 62 09/01/2020   BILITOT 0.4 09/01/2020   GFRNONAA >60 09/01/2020   GFRAA >60 04/21/2020    Medications: I have reviewed the patient's current medications.   Assessment/Plan: 1. Non-small cell lung cancer  MRI lumbar spine 04/29/2019-enlarging marrow lesions involving the L1 vertebral body, upper left sacrum and right iliac bone  MRI pelvis 04/29/2019-3.5 cm left iliac bone lesion appears slightly larger; other similar appearing lesions present within the left superior pubic ramus, left superior abdomen acetabulum and upper left sacrum  Kappa free light chains with mild elevation 05/12/2019  CTs10/07/2019-left lower lobe pulmonary mass 3.3 x 3.2 cm; lytic process left iliac bone; spinal lesions; 1.1 cm  low-density left kidney lesion; right thyroid enlargement with heterogeneous appearance with potential for multiple discrete lesions  Biopsy left lower lobe lung mass 05/26/2019-poorly differentiated carcinoma; positive for cytokeratin 5/6, p63 and TTF-1, no EGFR, BRAF, ALK,ERBB2,ROS, orNTRKalteration  Cycle 1 carboplatin/Alimta/pembrolizumab 06/06/2019  Cycle 2 carboplatin/Alimta/pembrolizumab 06/27/2019  Cycle 3 carboplatin/Alimta/pembrolizumab 07/18/2019  Cycle 4 carboplatin/Alimta/pembrolizumab1/01/2020  CTs 08/27/2019-significant decrease in size of lobulated mass left lower lobe. Unchanged appearance of subtle bone lesions.  Cycle 5 Alimta/pembrolizumab 08/28/2019  Cycle 6 Alimta/pembrolizumab 09/18/2019  Cycle 7 Alimta/pembrolizumab 10/09/2019  Cycle 8 Alimta/pembrolizumab 10/30/2019  Cycle 9 Alimta/pembrolizumab 11/20/2019  CTs 12/09/2019-no evidence of disease progression, left lower lobe nodule slightly decreased in size, stable L1, left sacral, and left pubic ramus metastases  Cycle 10 Alimta/pembrolizumab 12/11/2019  Cycle 11 pembrolizumab alone 01/02/2020 (Alimta held due to edema, tenderness, erythema at the lower legs)  Cycle 12 pembrolizumab 01/23/2020  Cycle 13 pembrolizumab 02/12/2020  Cycle 14 pembrolizumab 03/04/2020  CTs 03/18/2020-stable left lower lobe lesion, mild sclerosis at the superior endplate of L1 that was previously hypermetabolic, stable small left upper sacral lucent lesion, previous left superior pubic ramus lesion is occult on the CT, CT head negative for malignancy  Cycle 15 pembrolizumab 03/25/2020  Cycle 16 pembrolizumab 04/21/2020  Cycle 17 pembrolizumab 05/17/2020  05/19/2020 bone scan-no definite abnormalities to suggest osseous metastases. Areas of concern on prior PET-CT involving left iliac bone and L1 vertebral body showed no abnormalities on the current study  Cycle 18 Pembrolizumab 06/10/2020  Cycle 19 Pembrolizumab 06/30/2020  CTs  07/16/2020-stable left lower lobe nodule, stable faint superior L1 vertebral lesion, no evidence of disease progression  Cycle 20 pembrolizumab 07/21/2020  Cycle 21 Pembrolizumab 08/11/2020  Cycle 22 pembrolizumab 09/01/2020 2. Pain secondary to #1, improved 3. Chronic back pain 4. Type 2 diabetes 5. Essential hypertension 6. CAD 7. Hyperlipidemia 8. Family history significant for multiple members with breast cancer 9. Grade 1 skin rash 07/18/2019 likely related to immunotherapy. Topical steroid cream as needed. 10. E. coli urinary tract infection 07/14/2019. Completed cephalexin. 11. Edema/tenderness at the right greater than left ankle 10/21/2019-etiology unclear, potentially related to systemic therapy or an infection, doxycycline prescribed-improved 10/23/2019; marked improvement 11/20/2019; at office visit 01/02/2020 she reports worsening of lower extremity edema, pain/tenderness, erythema 3 to 4 days following each treatment. Alimta held 01/02/2020. Referral to dermatology. 12. COVID-19 infection 03/30/2020, monoclonal antibody therapy 04/06/2020     Disposition: [name] appears stable.  She is tolerating the pembrolizumab well.  She will complete another treatment today.  She will return for an office visit and pembrolizumab in 3 weeks.  Tina Coder, MD  09/01/2020  10:22 AM

## 2020-09-01 NOTE — Patient Instructions (Signed)
[facility] Discharge Instructions for Patients Receiving Chemotherapy  Today you received the following chemotherapy agents Beryle Flock  To help prevent nausea and vomiting after your treatment, we encourage you to take your nausea medication as directed   If you develop nausea and vomiting that is not controlled by your nausea medication, call the clinic.   BELOW ARE SYMPTOMS THAT SHOULD BE REPORTED IMMEDIATELY:  *FEVER GREATER THAN 100.5 F  *CHILLS WITH OR WITHOUT FEVER  NAUSEA AND VOMITING THAT IS NOT CONTROLLED WITH YOUR NAUSEA MEDICATION  *UNUSUAL SHORTNESS OF BREATH  *UNUSUAL BRUISING OR BLEEDING  TENDERNESS IN MOUTH AND THROAT WITH OR WITHOUT PRESENCE OF ULCERS  *URINARY PROBLEMS  *BOWEL PROBLEMS  UNUSUAL RASH Items with * indicate a potential emergency and should be followed up as soon as possible.  Feel free to call the clinic should you have any questions or concerns. The clinic phone number is [phone].  Please show the [facility] at check-in to the Emergency Department and triage nurse.

## 2020-09-02 ENCOUNTER — Telehealth: Payer: Self-pay | Admitting: Oncology

## 2020-09-02 NOTE — Telephone Encounter (Signed)
Scheduled appointments per 2/2 los. Spoke to patient who is aware of appointments date and times.

## 2020-09-06 IMAGING — CT NM PET TUM IMG INITIAL (PI) SKULL BASE T - THIGH
9 series · 21 of 25 positions shown · non-contrast
Comparison: CT scan 05/12/2019

CLINICAL DATA: Initial treatment strategy for left lower lobe lung
mass, also with bone lesions.

EXAM:
NUCLEAR MEDICINE PET SKULL BASE TO THIGH
TECHNIQUE: 8.5 mCi F-18 FDG was injected intravenously. Full-ring PET imaging
was performed from the skull base to thigh after the radiotracer. CT
data was obtained and used for attenuation correction and anatomic
localization.
Fasting blood glucose: 97 mg/dl

[Series 3: ct wb 5.0 b30f · axial · 5.0mm · 0.98mm/px · z∈[-1048,-394]mm · 3 of 329 slices shown]
[im 1/329]
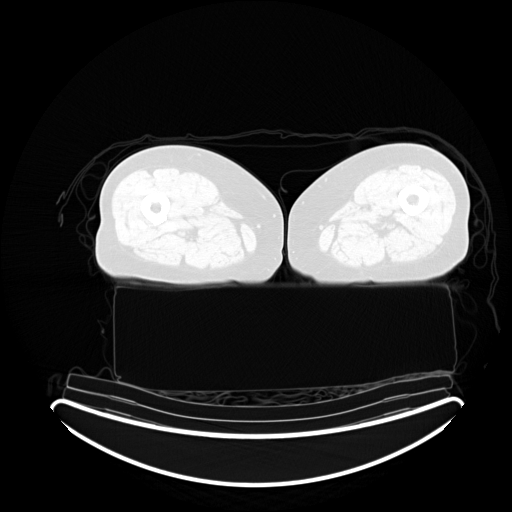
[im 110/329]
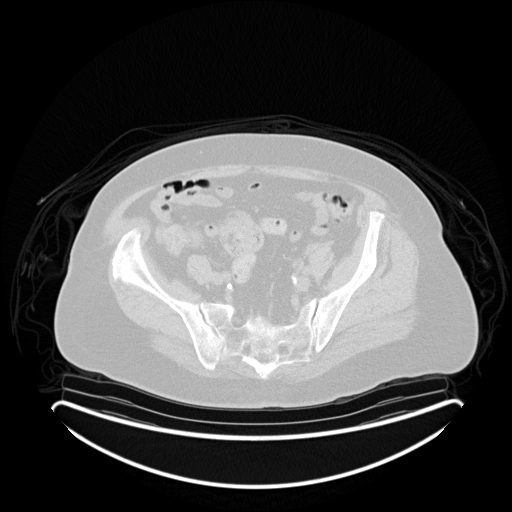
[im 219/329]
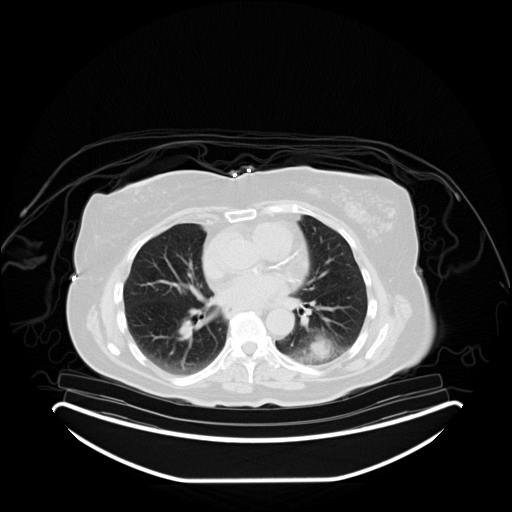

[Series 5: pet wb uncorrected (nac) · axial · 5.0mm · 4.07mm/px · z∈[-1048,-64]mm · 4 of 329 slices shown]
[im 1/329]
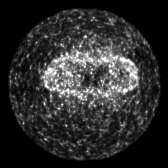
[im 110/329]
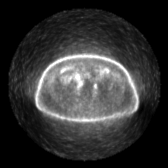
[im 219/329]
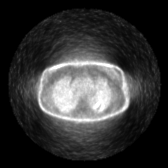
[im 329/329]
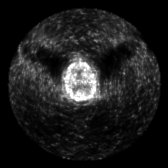

[Series 603: pet axial fused · 4 of 329 slices shown]
[im 1/329]
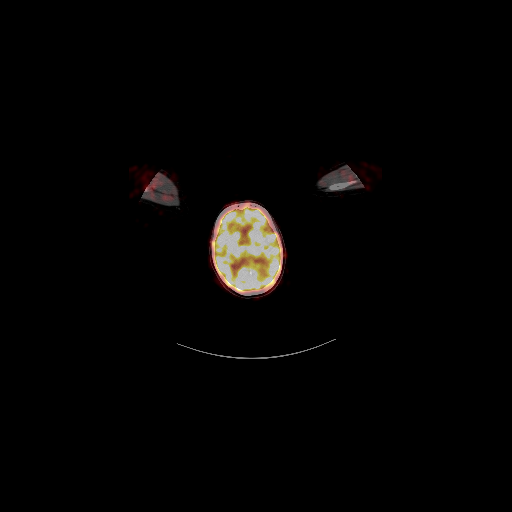
[im 165/329]
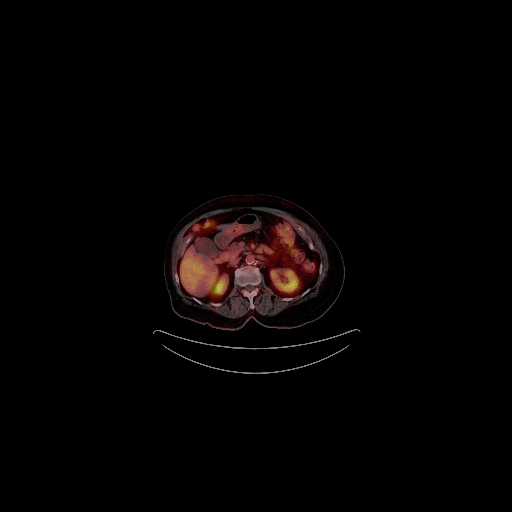
[im 247/329]
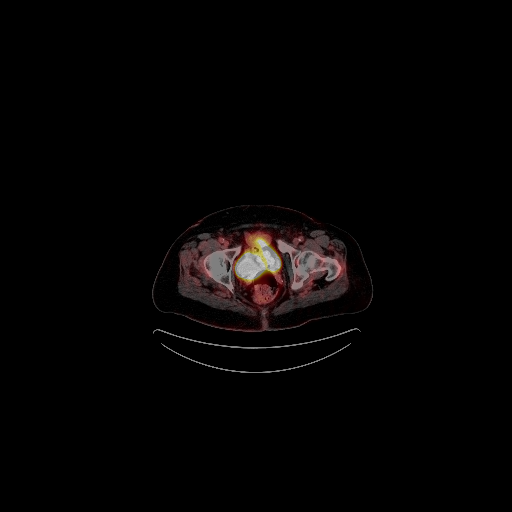
[im 329/329]
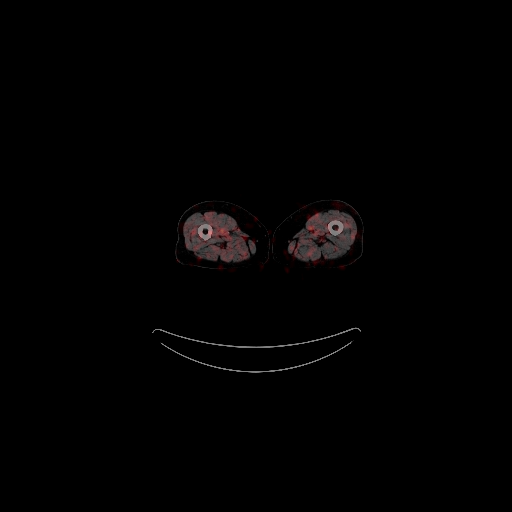

[Series 604: pet coronal fused · 1 of 74 slices shown]
[im 1/74]
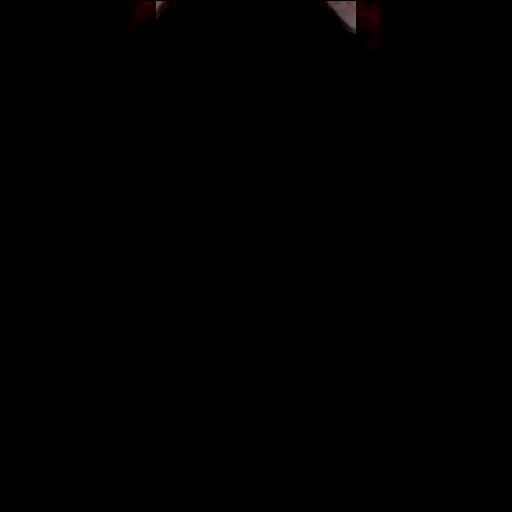

[Series 605: pet sagittal fused · 1 of 151 slices shown]
[im 1/151]
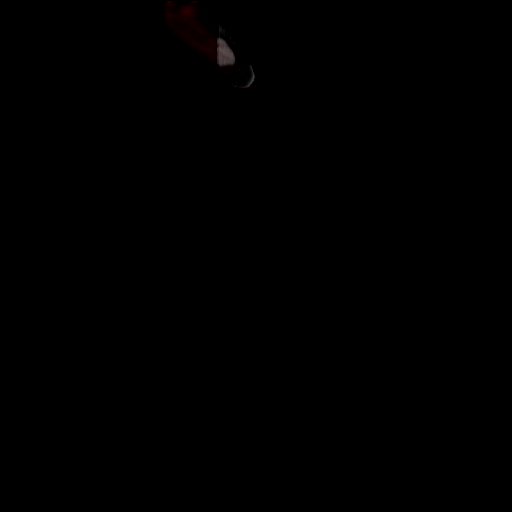

[Series 606: pet axial · 4 of 327 slices shown]
[im 1/327]
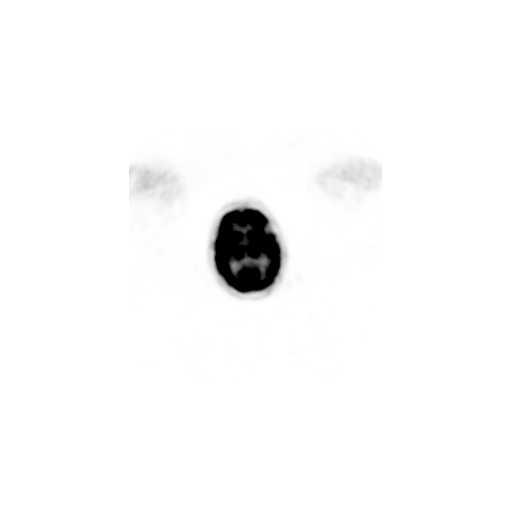
[im 109/327]
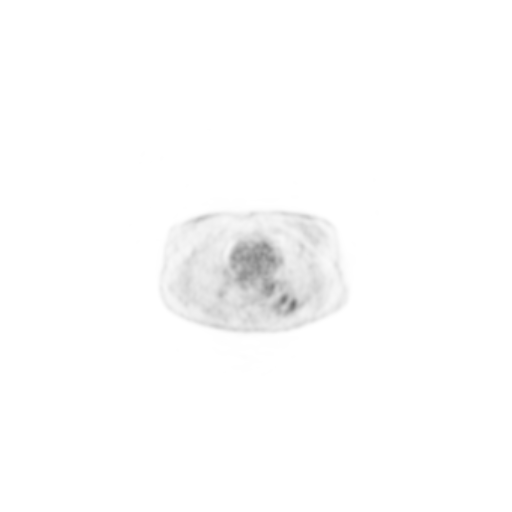
[im 218/327]
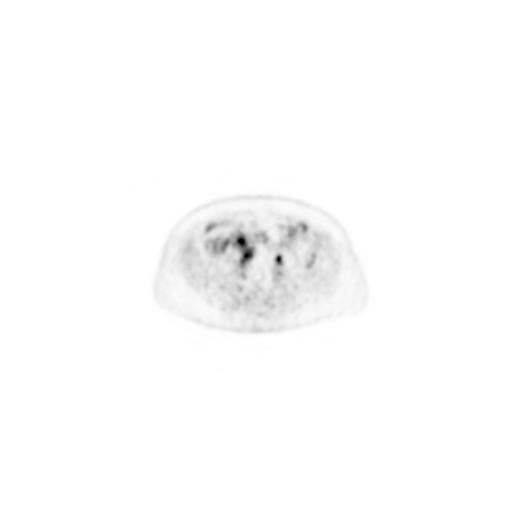
[im 327/327]
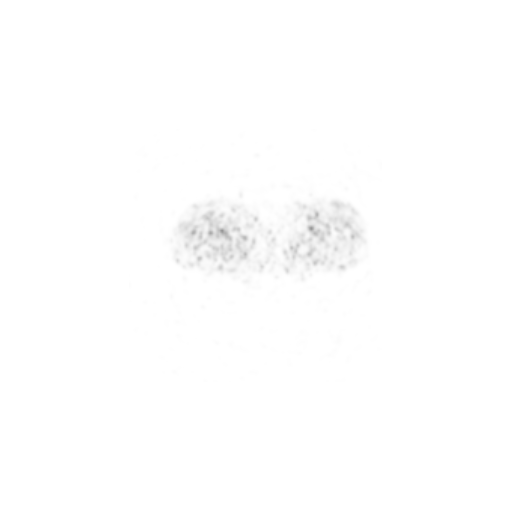

[Series 607: pet coronal · 1 of 116 slices shown]
[im 1/116]
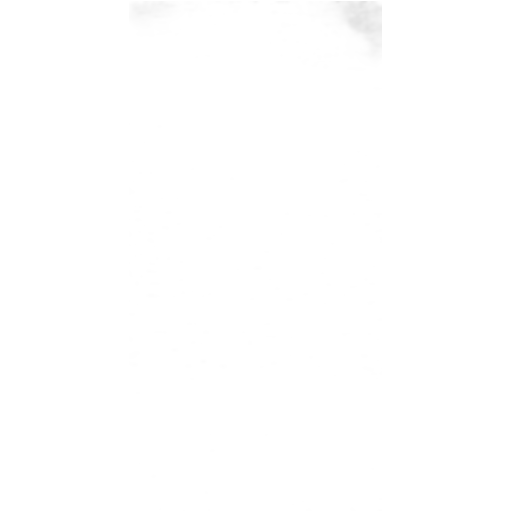

[Series 608: pet sagittal · 2 of 155 slices shown]
[im 1/155]
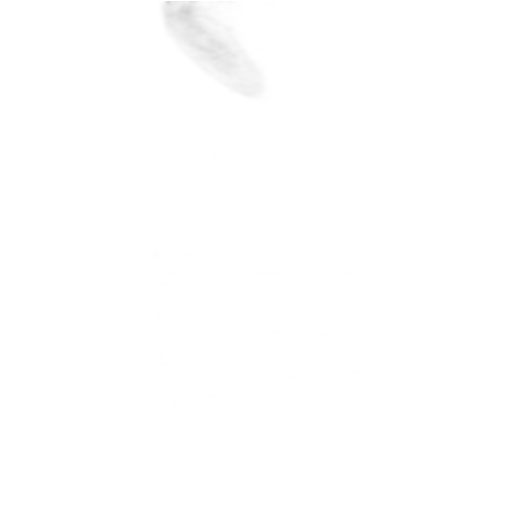
[im 155/155]
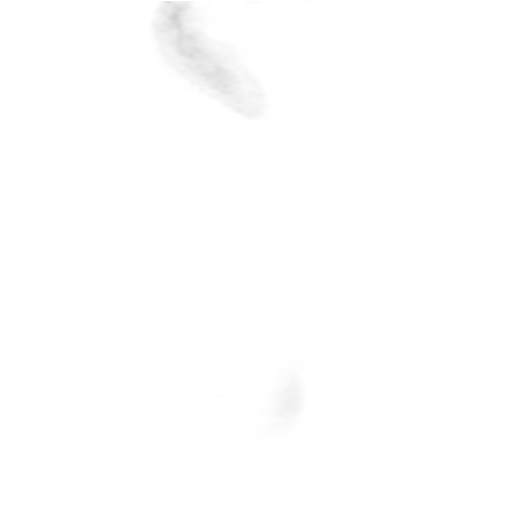

[Series 1051: results mm oncology reading · 5.0mm · 0.63mm/px · 1 of 10 slices shown]
[im 1/10]
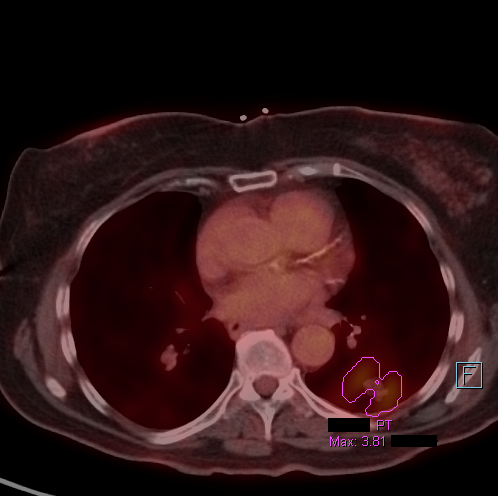

[21 of 25 positions shown; findings below may reference images not displayed]

FINDINGS: Mediastinal blood pool activity: SUV max

Liver activity: SUV max

NECK: No hypermetabolic thyroid activity. No significant abnormal
hypermetabolic activity in this region.

Incidental CT findings: None

CHEST: The [DATE] by 2.7 cm left lower lobe mass has a maximum SUV of
3.8, favoring low-grade malignancy. No hypermetabolic adenopathy in
the chest.

Incidental CT findings: Coronary, aortic arch, and branch vessel
atherosclerotic vascular disease.

ABDOMEN/PELVIS: No significant abnormal hypermetabolic activity in
this region.

Incidental CT findings: Aortoiliac atherosclerotic vascular disease.
Sigmoid colon diverticulosis.

SKELETON: Along the broad Schmorl's node or lesion along the
superior endplate of L1 there is some mildly accentuated metabolic
activity, maximum SUV 3.4. There is only low-grade activity in the
left iliac bone corresponding to the lesion seen on MRI 08/19/2018,
maximum SUV 2.7. Contralateral similar location has a maximum SUV of
1.1. The small lesion in the upper margin of the S1 vertebra
eccentric to the left is not well seen.

A small lateral lucency along the left scapula shown on prior CT is
visible on image 100/3 and likewise has very faintly accentuated
metabolic activity compared to the contralateral side, maximum SUV
2.1, contralateral side 1.2.

Incidental CT findings: None
IMPRESSION: 1. The left lower lobe mass measures 3.8 cm in long axis and has a
maximum SUV of 3.8 favoring low-grade malignancy. Biopsy is
recommended.
2. Previously identified bony lesions on MRI only have faintly
accentuated activity, including the left iliac bone lesion and the
lesion along the superior endplate of L1. These bony lesions remain
nonspecific.
3. No abnormal thyroid activity. Typically this indicates that the
thyroid nodules are benign.
4. Other imaging findings of potential clinical significance: Aortic
Atherosclerosis ([id number].J). Coronary atherosclerosis. Sigmoid
colon diverticulosis.

## 2020-09-19 ENCOUNTER — Other Ambulatory Visit: Payer: Self-pay | Admitting: Oncology

## 2020-09-22 ENCOUNTER — Inpatient Hospital Stay: Payer: Medicare Other

## 2020-09-22 ENCOUNTER — Encounter: Payer: Self-pay | Admitting: Nurse Practitioner

## 2020-09-22 ENCOUNTER — Inpatient Hospital Stay (HOSPITAL_BASED_OUTPATIENT_CLINIC_OR_DEPARTMENT_OTHER): Payer: Medicare Other | Admitting: Nurse Practitioner

## 2020-09-22 ENCOUNTER — Other Ambulatory Visit: Payer: Self-pay

## 2020-09-22 VITALS — HR 66

## 2020-09-22 VITALS — BP 138/64 | HR 57 | Temp 98.1°F | Resp 18 | Ht 65.0 in | Wt 181.5 lb

## 2020-09-22 DIAGNOSIS — Z5112 Encounter for antineoplastic immunotherapy: Secondary | ICD-10-CM | POA: Diagnosis not present

## 2020-09-22 DIAGNOSIS — C3492 Malignant neoplasm of unspecified part of left bronchus or lung: Secondary | ICD-10-CM

## 2020-09-22 DIAGNOSIS — Z95828 Presence of other vascular implants and grafts: Secondary | ICD-10-CM

## 2020-09-22 LAB — CBC WITH DIFFERENTIAL (CANCER CENTER ONLY)
Abs Immature Granulocytes: 0.01 10*3/uL (ref 0.00–0.07)
Basophils Absolute: 0 10*3/uL (ref 0.0–0.1)
Basophils Relative: 1 %
Eosinophils Absolute: 0 10*3/uL (ref 0.0–0.5)
Eosinophils Relative: 1 %
HCT: 34.5 % — ABNORMAL LOW (ref 36.0–46.0)
Hemoglobin: 11.4 g/dL — ABNORMAL LOW (ref 12.0–15.0)
Immature Granulocytes: 0 %
Lymphocytes Relative: 30 %
Lymphs Abs: 1.2 10*3/uL (ref 0.7–4.0)
MCH: 31.2 pg (ref 26.0–34.0)
MCHC: 33 g/dL (ref 30.0–36.0)
MCV: 94.5 fL (ref 80.0–100.0)
Monocytes Absolute: 0.4 10*3/uL (ref 0.1–1.0)
Monocytes Relative: 10 %
Neutro Abs: 2.4 10*3/uL (ref 1.7–7.7)
Neutrophils Relative %: 58 %
Platelet Count: 165 10*3/uL (ref 150–400)
RBC: 3.65 MIL/uL — ABNORMAL LOW (ref 3.87–5.11)
RDW: 13.6 % (ref 11.5–15.5)
WBC Count: 4.1 10*3/uL (ref 4.0–10.5)
nRBC: 0 % (ref 0.0–0.2)

## 2020-09-22 LAB — CMP (CANCER CENTER ONLY)
ALT: 21 U/L (ref 0–44)
AST: 19 U/L (ref 15–41)
Albumin: 3.9 g/dL (ref 3.5–5.0)
Alkaline Phosphatase: 68 U/L (ref 38–126)
Anion gap: 6 (ref 5–15)
BUN: 23 mg/dL (ref 8–23)
CO2: 25 mmol/L (ref 22–32)
Calcium: 9.6 mg/dL (ref 8.9–10.3)
Chloride: 103 mmol/L (ref 98–111)
Creatinine: 0.87 mg/dL (ref 0.44–1.00)
GFR, Estimated: >60 mL/min (ref >60)
Glucose, Bld: 99 mg/dL (ref 70–99)
Potassium: 4.3 mmol/L (ref 3.5–5.1)
Sodium: 134 mmol/L — ABNORMAL LOW (ref 135–145)
Total Bilirubin: 0.4 mg/dL (ref 0.3–1.2)
Total Protein: 6.9 g/dL (ref 6.5–8.1)

## 2020-09-22 LAB — TSH: TSH: 0.776 u[IU]/mL (ref 0.308–3.960)

## 2020-09-22 MED ORDER — SODIUM CHLORIDE 0.9 % IV SOLN
200.0000 mg | Freq: Once | INTRAVENOUS | Status: AC
  Administered 2020-09-22: 200 mg via INTRAVENOUS
  Filled 2020-09-22: qty 8

## 2020-09-22 MED ORDER — SODIUM CHLORIDE 0.9 % IV SOLN
Freq: Once | INTRAVENOUS | Status: AC
  Filled 2020-09-22: qty 250

## 2020-09-22 MED ORDER — SODIUM CHLORIDE 0.9% FLUSH
10.0000 mL | INTRAVENOUS | Status: DC | PRN
  Administered 2020-09-22: 10 mL
  Filled 2020-09-22: qty 10

## 2020-09-22 MED ORDER — SODIUM CHLORIDE 0.9% FLUSH
10.0000 mL | Freq: Once | INTRAVENOUS | Status: AC
  Administered 2020-09-22: 10 mL
  Filled 2020-09-22: qty 10

## 2020-09-22 MED ORDER — HEPARIN SOD (PORK) LOCK FLUSH 100 UNIT/ML IV SOLN
500.0000 [IU] | Freq: Once | INTRAVENOUS | Status: AC | PRN
  Administered 2020-09-22: 500 [IU]
  Filled 2020-09-22: qty 5

## 2020-09-22 NOTE — Patient Instructions (Signed)
[facility] Discharge Instructions for Patients Receiving Chemotherapy  Today you received the following chemotherapy agents Beryle Flock  To help prevent nausea and vomiting after your treatment, we encourage you to take your nausea medication as directed   If you develop nausea and vomiting that is not controlled by your nausea medication, call the clinic.   BELOW ARE SYMPTOMS THAT SHOULD BE REPORTED IMMEDIATELY:  *FEVER GREATER THAN 100.5 F  *CHILLS WITH OR WITHOUT FEVER  NAUSEA AND VOMITING THAT IS NOT CONTROLLED WITH YOUR NAUSEA MEDICATION  *UNUSUAL SHORTNESS OF BREATH  *UNUSUAL BRUISING OR BLEEDING  TENDERNESS IN MOUTH AND THROAT WITH OR WITHOUT PRESENCE OF ULCERS  *URINARY PROBLEMS  *BOWEL PROBLEMS  UNUSUAL RASH Items with * indicate a potential emergency and should be followed up as soon as possible.  Feel free to call the clinic should you have any questions or concerns. The clinic phone number is [phone].  Please show the [facility] at check-in to the Emergency Department and triage nurse.

## 2020-09-22 NOTE — Patient Instructions (Signed)

## 2020-09-22 NOTE — Progress Notes (Signed)
[facility] OFFICE PROGRESS NOTE   Diagnosis: Non-small cell lung cancer  INTERVAL HISTORY:   [name] returns as scheduled.  She completed another cycle of Pembrolizumab 09/01/2020.  Overall she is feeling well.  No diarrhea or rash.  She denies nausea/vomiting.  She has a good appetite.  Objective:  Vital signs in last 24 hours:  Blood pressure 138/64, pulse (!) 57, temperature 98.1 F (36.7 C), temperature source Tympanic, resp. rate 18, height _0  (1.651 m), weight 181 lb 8 oz (82.3 kg), SpO2 96 %.    Resp: Lungs clear bilaterally. Cardio: Regular rate and rhythm. GI: Abdomen soft and nontender.  No hepatomegaly. Vascular: No leg edema.  Skin: No rash. Port-A-Cath without erythema.   Lab Results:  Lab Results  Component Value Date   WBC 4.1 09/22/2020   HGB 11.4 (L) 09/22/2020   HCT 34.5 (L) 09/22/2020   MCV 94.5 09/22/2020   PLT 165 09/22/2020   NEUTROABS 2.4 09/22/2020    Imaging:  No results found.  Medications: I have reviewed the patient's current medications.  Assessment/Plan: 1. Non-small cell lung cancer  MRI lumbar spine 04/29/2019-enlarging marrow lesions involving the L1 vertebral body, upper left sacrum and right iliac bone  MRI pelvis 04/29/2019-3.5 cm left iliac bone lesion appears slightly larger; other similar appearing lesions present within the left superior pubic ramus, left superior abdomen acetabulum and upper left sacrum  Kappa free light chains with mild elevation 05/12/2019  CTs10/07/2019-left lower lobe pulmonary mass 3.3 x 3.2 cm; lytic process left iliac bone; spinal lesions; 1.1 cm low-density left kidney lesion; right thyroid enlargement with heterogeneous appearance with potential for multiple discrete lesions  Biopsy left lower lobe lung mass 05/26/2019-poorly differentiated carcinoma; positive for cytokeratin 5/6, p63 and TTF-1, no EGFR, BRAF, ALK,ERBB2,ROS, orNTRKalteration  Cycle 1  carboplatin/Alimta/pembrolizumab 06/06/2019  Cycle 2 carboplatin/Alimta/pembrolizumab 06/27/2019  Cycle 3 carboplatin/Alimta/pembrolizumab 07/18/2019  Cycle 4 carboplatin/Alimta/pembrolizumab1/01/2020  CTs 08/27/2019-significant decrease in size of lobulated mass left lower lobe. Unchanged appearance of subtle bone lesions.  Cycle 5 Alimta/pembrolizumab 08/28/2019  Cycle 6 Alimta/pembrolizumab 09/18/2019  Cycle 7 Alimta/pembrolizumab 10/09/2019  Cycle 8 Alimta/pembrolizumab 10/30/2019  Cycle 9 Alimta/pembrolizumab 11/20/2019  CTs 12/09/2019-no evidence of disease progression, left lower lobe nodule slightly decreased in size, stable L1, left sacral, and left pubic ramus metastases  Cycle 10 Alimta/pembrolizumab 12/11/2019  Cycle 11 pembrolizumab alone 01/02/2020 (Alimta held due to edema, tenderness, erythema at the lower legs)  Cycle 12 pembrolizumab 01/23/2020  Cycle 13 pembrolizumab 02/12/2020  Cycle 14 pembrolizumab 03/04/2020  CTs 03/18/2020-stable left lower lobe lesion, mild sclerosis at the superior endplate of L1 that was previously hypermetabolic, stable small left upper sacral lucent lesion, previous left superior pubic ramus lesion is occult on the CT, CT head negative for malignancy  Cycle 15 pembrolizumab 03/25/2020  Cycle 16 pembrolizumab 04/21/2020  Cycle 17 pembrolizumab 05/17/2020  05/19/2020 bone scan-no definite abnormalities to suggest osseous metastases. Areas of concern on prior PET-CT involving left iliac bone and L1 vertebral body showed no abnormalities on the current study  Cycle 18 Pembrolizumab 06/10/2020  Cycle 19 Pembrolizumab 06/30/2020  CTs 07/16/2020-stable left lower lobe nodule, stable faint superior L1 vertebral lesion, no evidence of disease progression  Cycle 20 pembrolizumab 07/21/2020  Cycle 21 Pembrolizumab 08/11/2020  Cycle 22 pembrolizumab 09/01/2020  Cycle 23 Pembrolizumab 09/22/2020 2. Pain secondary to #1, improved 3. Chronic back  pain 4. Type 2 diabetes 5. Essential hypertension 6. CAD 7. Hyperlipidemia 8. Family history significant for multiple members with breast  cancer 9. Grade 1 skin rash 07/18/2019 likely related to immunotherapy. Topical steroid cream as needed. 10. E. coli urinary tract infection 07/14/2019. Completed cephalexin. 11. Edema/tenderness at the right greater than left ankle 10/21/2019-etiology unclear, potentially related to systemic therapy or an infection, doxycycline prescribed-improved 10/23/2019; marked improvement 11/20/2019; at office visit 01/02/2020 she reports worsening of lower extremity edema, pain/tenderness, erythema 3 to 4 days following each treatment. Alimta held 01/02/2020. Referral to dermatology. 12. COVID-19 infection 03/30/2020, monoclonal antibody therapy 04/06/2020   Disposition: [name] appears stable.  There is no clinical evidence of disease progression.  Plan to continue every 3-week Pembrolizumab, cycle 23 today.  We reviewed the CBC from today.  Counts adequate to proceed with treatment.  Chemistry panel pending.  She will return for lab, follow-up, Pembrolizumab in 3 weeks.    Ned Card ANP/GNP-BC   09/22/2020  9:30 AM

## 2020-09-23 ENCOUNTER — Telehealth: Payer: Self-pay | Admitting: Nurse Practitioner

## 2020-09-23 NOTE — Telephone Encounter (Signed)
Scheduled appointments per 2/23 los. Will have updated calendar printed for patient at next visit.

## 2020-10-10 ENCOUNTER — Other Ambulatory Visit: Payer: Self-pay | Admitting: Oncology

## 2020-10-13 ENCOUNTER — Inpatient Hospital Stay: Payer: Medicare Other

## 2020-10-13 ENCOUNTER — Inpatient Hospital Stay (HOSPITAL_BASED_OUTPATIENT_CLINIC_OR_DEPARTMENT_OTHER): Payer: Medicare Other | Admitting: Oncology

## 2020-10-13 ENCOUNTER — Inpatient Hospital Stay: Payer: Medicare Other | Attending: Oncology

## 2020-10-13 ENCOUNTER — Other Ambulatory Visit: Payer: Self-pay

## 2020-10-13 VITALS — BP 131/58 | HR 63 | Temp 97.8°F | Resp 20 | Ht 65.0 in | Wt 184.3 lb

## 2020-10-13 DIAGNOSIS — C3492 Malignant neoplasm of unspecified part of left bronchus or lung: Secondary | ICD-10-CM

## 2020-10-13 DIAGNOSIS — C3432 Malignant neoplasm of lower lobe, left bronchus or lung: Secondary | ICD-10-CM | POA: Insufficient documentation

## 2020-10-13 DIAGNOSIS — Z95828 Presence of other vascular implants and grafts: Secondary | ICD-10-CM

## 2020-10-13 DIAGNOSIS — C7951 Secondary malignant neoplasm of bone: Secondary | ICD-10-CM | POA: Diagnosis present

## 2020-10-13 DIAGNOSIS — Z5112 Encounter for antineoplastic immunotherapy: Secondary | ICD-10-CM | POA: Insufficient documentation

## 2020-10-13 LAB — CMP (CANCER CENTER ONLY)
ALT: 21 U/L (ref 0–44)
AST: 22 U/L (ref 15–41)
Albumin: 3.8 g/dL (ref 3.5–5.0)
Alkaline Phosphatase: 63 U/L (ref 38–126)
Anion gap: 6 (ref 5–15)
BUN: 27 mg/dL — ABNORMAL HIGH (ref 8–23)
CO2: 24 mmol/L (ref 22–32)
Calcium: 9.2 mg/dL (ref 8.9–10.3)
Chloride: 104 mmol/L (ref 98–111)
Creatinine: 0.98 mg/dL (ref 0.44–1.00)
GFR, Estimated: 57 mL/min — ABNORMAL LOW (ref >60)
Glucose, Bld: 196 mg/dL — ABNORMAL HIGH (ref 70–99)
Potassium: 4.2 mmol/L (ref 3.5–5.1)
Sodium: 134 mmol/L — ABNORMAL LOW (ref 135–145)
Total Bilirubin: 0.3 mg/dL (ref 0.3–1.2)
Total Protein: 6.4 g/dL — ABNORMAL LOW (ref 6.5–8.1)

## 2020-10-13 LAB — CBC WITH DIFFERENTIAL (CANCER CENTER ONLY)
Abs Immature Granulocytes: 0.02 10*3/uL (ref 0.00–0.07)
Basophils Absolute: 0 10*3/uL (ref 0.0–0.1)
Basophils Relative: 0 %
Eosinophils Absolute: 0 10*3/uL (ref 0.0–0.5)
Eosinophils Relative: 0 %
HCT: 33.8 % — ABNORMAL LOW (ref 36.0–46.0)
Hemoglobin: 10.9 g/dL — ABNORMAL LOW (ref 12.0–15.0)
Immature Granulocytes: 0 %
Lymphocytes Relative: 27 %
Lymphs Abs: 1.8 10*3/uL (ref 0.7–4.0)
MCH: 31.1 pg (ref 26.0–34.0)
MCHC: 32.2 g/dL (ref 30.0–36.0)
MCV: 96.3 fL (ref 80.0–100.0)
Monocytes Absolute: 0.4 10*3/uL (ref 0.1–1.0)
Monocytes Relative: 6 %
Neutro Abs: 4.4 10*3/uL (ref 1.7–7.7)
Neutrophils Relative %: 67 %
Platelet Count: 161 10*3/uL (ref 150–400)
RBC: 3.51 MIL/uL — ABNORMAL LOW (ref 3.87–5.11)
RDW: 13.5 % (ref 11.5–15.5)
WBC Count: 6.6 10*3/uL (ref 4.0–10.5)
nRBC: 0 % (ref 0.0–0.2)

## 2020-10-13 MED ORDER — HEPARIN SOD (PORK) LOCK FLUSH 100 UNIT/ML IV SOLN
500.0000 [IU] | Freq: Once | INTRAVENOUS | Status: AC | PRN
Start: 2020-10-13 — End: 2020-10-13
  Administered 2020-10-13: 500 [IU]
  Filled 2020-10-13: qty 5

## 2020-10-13 MED ORDER — SODIUM CHLORIDE 0.9% FLUSH
10.0000 mL | INTRAVENOUS | Status: DC | PRN
  Administered 2020-10-13: 10 mL
  Filled 2020-10-13: qty 10

## 2020-10-13 MED ORDER — SODIUM CHLORIDE 0.9% FLUSH
10.0000 mL | Freq: Once | INTRAVENOUS | Status: AC
  Administered 2020-10-13: 10 mL
  Filled 2020-10-13: qty 10

## 2020-10-13 MED ORDER — SODIUM CHLORIDE 0.9 % IV SOLN
200.0000 mg | Freq: Once | INTRAVENOUS | Status: AC
  Administered 2020-10-13: 200 mg via INTRAVENOUS
  Filled 2020-10-13: qty 8

## 2020-10-13 MED ORDER — SODIUM CHLORIDE 0.9 % IV SOLN
Freq: Once | INTRAVENOUS | Status: AC
  Filled 2020-10-13: qty 250

## 2020-10-13 NOTE — Patient Instructions (Signed)
[facility] Discharge Instructions for Patients Receiving Chemotherapy  Today you received the following chemotherapy agents: pembrolizumab.  To help prevent nausea and vomiting after your treatment, we encourage you to take your nausea medication as directed.   If you develop nausea and vomiting that is not controlled by your nausea medication, call the clinic.   BELOW ARE SYMPTOMS THAT SHOULD BE REPORTED IMMEDIATELY:  *FEVER GREATER THAN 100.5 F  *CHILLS WITH OR WITHOUT FEVER  NAUSEA AND VOMITING THAT IS NOT CONTROLLED WITH YOUR NAUSEA MEDICATION  *UNUSUAL SHORTNESS OF BREATH  *UNUSUAL BRUISING OR BLEEDING  TENDERNESS IN MOUTH AND THROAT WITH OR WITHOUT PRESENCE OF ULCERS  *URINARY PROBLEMS  *BOWEL PROBLEMS  UNUSUAL RASH Items with * indicate a potential emergency and should be followed up as soon as possible.  Feel free to call the clinic should you have any questions or concerns. The clinic phone number is [phone].  Please show the CHEMO ALERT CARD at check-in to the Emergency Department and triage nurse.   

## 2020-10-13 NOTE — Progress Notes (Signed)
[name]   Diagnosis: Non-small cell lung cancer  INTERVAL HISTORY:   [name] completed another treatment of pembrolizumab on 09/22/2020.  No rash or diarrhea.  Stable bone pain.  She had an episode of "angina" while sitting at her computer this morning.  She describes pain in the back radiating to the anterior chest and jaw.  The pain was relieved after taking nitroglycerin.  She feels well at present.  Objective:  Vital signs in last 24 hours:  Blood pressure (!) 131/58, pulse 63, temperature 97.8 F (36.6 C), temperature source Tympanic, resp. rate 20, height '5\' 5"'  (1.651 m), weight 184 lb 4.8 oz (83.6 kg), SpO2 96 %.   Resp: Lungs clear bilaterally Cardio: Regular rate and rhythm, 2/6 systolic murmur GI: No hepatosplenomegaly Vascular: No leg edema  Skin: No rash  Portacath/PICC-without erythema  Lab Results:  Lab Results  Component Value Date   WBC 6.6 10/13/2020   HGB 10.9 (L) 10/13/2020   HCT 33.8 (L) 10/13/2020   MCV 96.3 10/13/2020   PLT 161 10/13/2020   NEUTROABS 4.4 10/13/2020    CMP  Lab Results  Component Value Date   NA 134 (L) 09/22/2020   K 4.3 09/22/2020   CL 103 09/22/2020   CO2 25 09/22/2020   GLUCOSE 99 09/22/2020   BUN 23 09/22/2020   CREATININE 0.87 09/22/2020   CALCIUM 9.6 09/22/2020   PROT 6.9 09/22/2020   ALBUMIN 3.9 09/22/2020   AST 19 09/22/2020   ALT 21 09/22/2020   ALKPHOS 68 09/22/2020   BILITOT 0.4 09/22/2020   GFRNONAA >60 09/22/2020   GFRAA >60 04/21/2020     Medications: I have reviewed the patient's current medications.   Assessment/Plan: 1. Non-small cell lung cancer  MRI lumbar spine 04/29/2019-enlarging marrow lesions involving the L1 vertebral body, upper left sacrum and right iliac bone  MRI pelvis 04/29/2019-3.5 cm left iliac bone lesion appears slightly larger; other similar appearing lesions present within the left superior pubic ramus, left superior abdomen  acetabulum and upper left sacrum  Kappa free light chains with mild elevation 05/12/2019  CTs10/07/2019-left lower lobe pulmonary mass 3.3 x 3.2 cm; lytic process left iliac bone; spinal lesions; 1.1 cm low-density left kidney lesion; right thyroid enlargement with heterogeneous appearance with potential for multiple discrete lesions  Biopsy left lower lobe lung mass 05/26/2019-poorly differentiated carcinoma; positive for cytokeratin 5/6, p63 and TTF-1, no EGFR, BRAF, ALK,ERBB2,ROS, orNTRKalteration  Cycle 1 carboplatin/Alimta/pembrolizumab 06/06/2019  Cycle 2 carboplatin/Alimta/pembrolizumab 06/27/2019  Cycle 3 carboplatin/Alimta/pembrolizumab 07/18/2019  Cycle 4 carboplatin/Alimta/pembrolizumab1/01/2020  CTs 08/27/2019-significant decrease in size of lobulated mass left lower lobe. Unchanged appearance of subtle bone lesions.  Cycle 5 Alimta/pembrolizumab 08/28/2019  Cycle 6 Alimta/pembrolizumab 09/18/2019  Cycle 7 Alimta/pembrolizumab 10/09/2019  Cycle 8 Alimta/pembrolizumab 10/30/2019  Cycle 9 Alimta/pembrolizumab 11/20/2019  CTs 12/09/2019-no evidence of disease progression, left lower lobe nodule slightly decreased in size, stable L1, left sacral, and left pubic ramus metastases  Cycle 10 Alimta/pembrolizumab 12/11/2019  Cycle 11 pembrolizumab alone 01/02/2020 (Alimta held due to edema, tenderness, erythema at the lower legs)  Cycle 12 pembrolizumab 01/23/2020  Cycle 13 pembrolizumab 02/12/2020  Cycle 14 pembrolizumab 03/04/2020  CTs 03/18/2020-stable left lower lobe lesion, mild sclerosis at the superior endplate of L1 that was previously hypermetabolic, stable small left upper sacral lucent lesion, previous left superior pubic ramus lesion is occult on the CT, CT head negative for malignancy  Cycle 15 pembrolizumab 03/25/2020  Cycle 16 pembrolizumab 04/21/2020  Cycle 17 pembrolizumab 05/17/2020  05/19/2020 bone scan-no definite abnormalities to suggest osseous metastases.  Areas of concern on prior PET-CT involving left iliac bone and L1 vertebral body showed no abnormalities on the current study  Cycle 18 Pembrolizumab 06/10/2020  Cycle 19 Pembrolizumab 06/30/2020  CTs 07/16/2020-stable left lower lobe nodule, stable faint superior L1 vertebral lesion, no evidence of disease progression  Cycle 20 pembrolizumab 07/21/2020  Cycle 21 Pembrolizumab 08/11/2020  Cycle 22 pembrolizumab 09/01/2020  Cycle 23 Pembrolizumab 09/22/2020  Cycle 24 pembrolizumab 10/13/2020 2. Pain secondary to #1, improved 3. Chronic back pain 4. Type 2 diabetes 5. Essential hypertension 6. CAD 7. Hyperlipidemia 8. Family history significant for multiple members with breast cancer 9. Grade 1 skin rash 07/18/2019 likely related to immunotherapy. Topical steroid cream as needed. 10. E. coli urinary tract infection 07/14/2019. Completed cephalexin. 11. Edema/tenderness at the right greater than left ankle 10/21/2019-etiology unclear, potentially related to systemic therapy or an infection, doxycycline prescribed-improved 10/23/2019; marked improvement 11/20/2019; at office visit 01/02/2020 she reports worsening of lower extremity edema, pain/tenderness, erythema 3 to 4 days following each treatment. Alimta held 01/02/2020. Referral to dermatology. 12. COVID-19 infection 03/30/2020, monoclonal antibody therapy 04/06/2020     Disposition: [name] appears unchanged.  She continues to tolerate the pembrolizumab well.  She will complete another treatment today.  She will seek medical attention for recurrent chest pain.  She has chronic mild anemia, likely related to "chronic disease "and metastatic lung cancer.  I have a low suspicion for anemia related to pembrolizumab.  She will return for an office visit and pembrolizumab in 3 weeks.    [name], MD  10/13/2020  9:37 AM

## 2020-10-13 NOTE — Patient Instructions (Signed)

## 2020-10-25 ENCOUNTER — Telehealth: Payer: Self-pay | Admitting: Oncology

## 2020-10-25 NOTE — Telephone Encounter (Signed)
I mailed updated appointment calendar/letter mailed to patient w/ change of location

## 2020-10-30 ENCOUNTER — Other Ambulatory Visit: Payer: Self-pay | Admitting: Oncology

## 2020-11-03 ENCOUNTER — Ambulatory Visit: Payer: Medicare Other

## 2020-11-03 ENCOUNTER — Other Ambulatory Visit: Payer: Medicare Other

## 2020-11-03 ENCOUNTER — Inpatient Hospital Stay: Payer: Medicare Other

## 2020-11-03 ENCOUNTER — Ambulatory Visit: Payer: Medicare Other | Attending: Internal Medicine

## 2020-11-03 ENCOUNTER — Inpatient Hospital Stay: Payer: Medicare Other | Attending: Oncology | Admitting: Nurse Practitioner

## 2020-11-03 ENCOUNTER — Encounter: Payer: Self-pay | Admitting: Nurse Practitioner

## 2020-11-03 ENCOUNTER — Telehealth: Payer: Self-pay | Admitting: Nurse Practitioner

## 2020-11-03 ENCOUNTER — Other Ambulatory Visit: Payer: Self-pay

## 2020-11-03 VITALS — BP 143/59 | HR 52 | Temp 97.8°F | Resp 18

## 2020-11-03 VITALS — BP 160/66 | HR 62 | Temp 97.8°F | Resp 18 | Ht 65.0 in | Wt 182.6 lb

## 2020-11-03 DIAGNOSIS — C3492 Malignant neoplasm of unspecified part of left bronchus or lung: Secondary | ICD-10-CM

## 2020-11-03 DIAGNOSIS — C7951 Secondary malignant neoplasm of bone: Secondary | ICD-10-CM | POA: Diagnosis not present

## 2020-11-03 DIAGNOSIS — Z5112 Encounter for antineoplastic immunotherapy: Secondary | ICD-10-CM | POA: Insufficient documentation

## 2020-11-03 DIAGNOSIS — C3432 Malignant neoplasm of lower lobe, left bronchus or lung: Secondary | ICD-10-CM | POA: Diagnosis present

## 2020-11-03 DIAGNOSIS — Z79899 Other long term (current) drug therapy: Secondary | ICD-10-CM | POA: Diagnosis not present

## 2020-11-03 DIAGNOSIS — Z23 Encounter for immunization: Secondary | ICD-10-CM

## 2020-11-03 LAB — CMP (CANCER CENTER ONLY)
ALT: 13 U/L (ref 0–44)
AST: 15 U/L (ref 15–41)
Albumin: 4.3 g/dL (ref 3.5–5.0)
Alkaline Phosphatase: 65 U/L (ref 38–126)
Anion gap: 7 (ref 5–15)
BUN: 22 mg/dL (ref 8–23)
CO2: 26 mmol/L (ref 22–32)
Calcium: 9.5 mg/dL (ref 8.9–10.3)
Chloride: 102 mmol/L (ref 98–111)
Creatinine: 0.79 mg/dL (ref 0.44–1.00)
GFR, Estimated: >60 mL/min (ref >60)
Glucose, Bld: 109 mg/dL — ABNORMAL HIGH (ref 70–99)
Potassium: 4.3 mmol/L (ref 3.5–5.1)
Sodium: 135 mmol/L (ref 135–145)
Total Bilirubin: 0.5 mg/dL (ref 0.3–1.2)
Total Protein: 6.8 g/dL (ref 6.5–8.1)

## 2020-11-03 LAB — CBC WITH DIFFERENTIAL (CANCER CENTER ONLY)
Abs Immature Granulocytes: 0.01 10*3/uL (ref 0.00–0.07)
Basophils Absolute: 0 10*3/uL (ref 0.0–0.1)
Basophils Relative: 0 %
Eosinophils Absolute: 0 10*3/uL (ref 0.0–0.5)
Eosinophils Relative: 1 %
HCT: 35.8 % — ABNORMAL LOW (ref 36.0–46.0)
Hemoglobin: 11.8 g/dL — ABNORMAL LOW (ref 12.0–15.0)
Immature Granulocytes: 0 %
Lymphocytes Relative: 21 %
Lymphs Abs: 1 10*3/uL (ref 0.7–4.0)
MCH: 32 pg (ref 26.0–34.0)
MCHC: 33 g/dL (ref 30.0–36.0)
MCV: 97 fL (ref 80.0–100.0)
Monocytes Absolute: 0.5 10*3/uL (ref 0.1–1.0)
Monocytes Relative: 9 %
Neutro Abs: 3.5 10*3/uL (ref 1.7–7.7)
Neutrophils Relative %: 69 %
Platelet Count: 162 10*3/uL (ref 150–400)
RBC: 3.69 MIL/uL — ABNORMAL LOW (ref 3.87–5.11)
RDW: 13.1 % (ref 11.5–15.5)
WBC Count: 5 10*3/uL (ref 4.0–10.5)
nRBC: 0 % (ref 0.0–0.2)

## 2020-11-03 MED ORDER — SODIUM CHLORIDE 0.9% FLUSH
10.0000 mL | INTRAVENOUS | Status: DC | PRN
  Filled 2020-11-03: qty 10

## 2020-11-03 MED ORDER — SODIUM CHLORIDE 0.9 % IV SOLN
200.0000 mg | Freq: Once | INTRAVENOUS | Status: AC
  Administered 2020-11-03: 200 mg via INTRAVENOUS
  Filled 2020-11-03: qty 8

## 2020-11-03 MED ORDER — HEPARIN SOD (PORK) LOCK FLUSH 100 UNIT/ML IV SOLN
500.0000 [IU] | Freq: Once | INTRAVENOUS | Status: AC | PRN
  Administered 2020-11-03: 500 [IU]
  Filled 2020-11-03: qty 5

## 2020-11-03 MED ORDER — SODIUM CHLORIDE 0.9 % IV SOLN
Freq: Once | INTRAVENOUS | Status: AC
  Filled 2020-11-03: qty 250

## 2020-11-03 NOTE — Telephone Encounter (Signed)
Scheduled appt per 4/6 los - gave patient AVS and calender in treatment area.

## 2020-11-03 NOTE — Progress Notes (Signed)
   Covid-19 Vaccination Clinic  Name:  [name]    MRN: [id number] DOB: Oct 23, 1934  11/03/2020  [name] was observed post Covid-19 immunization for 15 minutes without incident. She was provided with Vaccine Information Sheet and instruction to access the V-Safe system.   [name] was instructed to call 911 with any severe reactions post vaccine: Marland Kitchen Difficulty breathing  . Swelling of face and throat  . A fast heartbeat  . A bad rash all over body  . Dizziness and weakness   Immunizations Administered    Name Date Dose VIS Date Route   PFIZER Comrnaty(Gray TOP) Covid-19 Vaccine 11/03/2020 11:29 AM 0.3 mL 07/08/2020 Intramuscular   Manufacturer: [place]   Lot: [id number]   [place]: [phone]

## 2020-11-03 NOTE — Progress Notes (Addendum)
[facility] OFFICE PROGRESS NOTE   Diagnosis: Non-small cell lung cancer  INTERVAL HISTORY:   [name] returns as scheduled.  She completed another cycle of Pembrolizumab 10/13/2020.  She denies nausea/vomiting.  No diarrhea.  No skin rash.  Pain overall is stable.  She notes some increase in pain at the left lateral neck.  She has had this pain in the past.  She takes Tylenol with good relief.  She is having intermittent "indigestion".  She takes Prilosec.  She reports a good appetite.  She feels dyspnea exertion has increased.  No fever or cough.  She notes a generalized decrease in her strength over the past 3 to 4 weeks.  Objective:  Vital signs in last 24 hours:  Blood pressure (!) 160/66, pulse 62, temperature 97.8 F (36.6 C), temperature source Tympanic, resp. rate 18, height '5\' 5"'  (1.651 m), weight 182 lb 9.6 oz (82.8 kg), SpO2 96 %.    HEENT: No thrush or ulcers. Lymphatics: No palpable cervical lymph nodes. Resp: Lungs clear bilaterally. Cardio: Regular rate and rhythm, 2/6 systolic murmur. GI: Abdomen soft and nontender.  No hepatomegaly. Vascular: No leg edema. Neuro: Alert and oriented. Skin: No rash. Port-A-Cath without erythema.   Lab Results:  Lab Results  Component Value Date   WBC 5.0 11/03/2020   HGB 11.8 (L) 11/03/2020   HCT 35.8 (L) 11/03/2020   MCV 97.0 11/03/2020   PLT 162 11/03/2020   NEUTROABS 3.5 11/03/2020    Imaging:  No results found.  Medications: I have reviewed the patient's current medications.  Assessment/Plan: 1. Non-small cell lung cancer  MRI lumbar spine 04/29/2019-enlarging marrow lesions involving the L1 vertebral body, upper left sacrum and right iliac bone  MRI pelvis 04/29/2019-3.5 cm left iliac bone lesion appears slightly larger; other similar appearing lesions present within the left superior pubic ramus, left superior abdomen acetabulum and upper left sacrum  Kappa free light chains with mild  elevation 05/12/2019  CTs10/07/2019-left lower lobe pulmonary mass 3.3 x 3.2 cm; lytic process left iliac bone; spinal lesions; 1.1 cm low-density left kidney lesion; right thyroid enlargement with heterogeneous appearance with potential for multiple discrete lesions  Biopsy left lower lobe lung mass 05/26/2019-poorly differentiated carcinoma; positive for cytokeratin 5/6, p63 and TTF-1, no EGFR, BRAF, ALK,ERBB2,ROS, orNTRKalteration  Cycle 1 carboplatin/Alimta/pembrolizumab 06/06/2019  Cycle 2 carboplatin/Alimta/pembrolizumab 06/27/2019  Cycle 3 carboplatin/Alimta/pembrolizumab 07/18/2019  Cycle 4 carboplatin/Alimta/pembrolizumab1/01/2020  CTs 08/27/2019-significant decrease in size of lobulated mass left lower lobe. Unchanged appearance of subtle bone lesions.  Cycle 5 Alimta/pembrolizumab 08/28/2019  Cycle 6 Alimta/pembrolizumab 09/18/2019  Cycle 7 Alimta/pembrolizumab 10/09/2019  Cycle 8 Alimta/pembrolizumab 10/30/2019  Cycle 9 Alimta/pembrolizumab 11/20/2019  CTs 12/09/2019-no evidence of disease progression, left lower lobe nodule slightly decreased in size, stable L1, left sacral, and left pubic ramus metastases  Cycle 10 Alimta/pembrolizumab 12/11/2019  Cycle 11 pembrolizumab alone 01/02/2020 (Alimta held due to edema, tenderness, erythema at the lower legs)  Cycle 12 pembrolizumab 01/23/2020  Cycle 13 pembrolizumab 02/12/2020  Cycle 14 pembrolizumab 03/04/2020  CTs 03/18/2020-stable left lower lobe lesion, mild sclerosis at the superior endplate of L1 that was previously hypermetabolic, stable small left upper sacral lucent lesion, previous left superior pubic ramus lesion is occult on the CT, CT head negative for malignancy  Cycle 15 pembrolizumab 03/25/2020  Cycle 16 pembrolizumab 04/21/2020  Cycle 17 pembrolizumab 05/17/2020  05/19/2020 bone scan-no definite abnormalities to suggest osseous metastases. Areas of concern on prior PET-CT involving left iliac bone and L1  vertebral body showed  no abnormalities on the current study  Cycle 18 Pembrolizumab 06/10/2020  Cycle 19 Pembrolizumab 06/30/2020  CTs 07/16/2020-stable left lower lobe nodule, stable faint superior L1 vertebral lesion, no evidence of disease progression  Cycle 20 pembrolizumab 07/21/2020  Cycle 21 Pembrolizumab 08/11/2020  Cycle 22 pembrolizumab 09/01/2020  Cycle 23 Pembrolizumab 09/22/2020  Cycle 24 pembrolizumab 10/13/2020  Cycle 25 Pembrolizumab 11/03/2020 2. Pain secondary to #1, improved 3. Chronic back pain 4. Type 2 diabetes 5. Essential hypertension 6. CAD 7. Hyperlipidemia 8. Family history significant for multiple members with breast cancer 9. Grade 1 skin rash 07/18/2019 likely related to immunotherapy. Topical steroid cream as needed. 10. E. coli urinary tract infection 07/14/2019. Completed cephalexin. 11. Edema/tenderness at the right greater than left ankle 10/21/2019-etiology unclear, potentially related to systemic therapy or an infection, doxycycline prescribed-improved 10/23/2019; marked improvement 11/20/2019; at office visit 01/02/2020 she reports worsening of lower extremity edema, pain/tenderness, erythema 3 to 4 days following each treatment. Alimta held 01/02/2020. Referral to dermatology. 12. COVID-19 infection 03/30/2020, monoclonal antibody therapy 04/06/2020   Disposition: [name] appears unchanged.  She has completed 24 cycles of Pembrolizumab.  There is no clinical evidence of disease progression.  Plan to proceed with cycle 25 today as scheduled.  She complains of generalized decrease in strength over the past 3 to 4 weeks.  We are checking a TSH and cortisol level.  CBC from today reviewed.  Counts adequate to proceed with treatment.  She will return for lab, follow-up, Pembrolizumab in 3 weeks.  She will contact the office in the interim with any problems.  Patient seen with Dr. Benay Spice.  Ned Card ANP/GNP-BC   11/03/2020  9:38 AM This was a  shared visit with Ned Card.  [name] was interviewed and examined.  Her overall status appears unchanged.  The plan is to continue pembrolizumab.  I was present for greater than 50% of today's visit.  I performed medical decision making.  Julieanne Manson, MD

## 2020-11-11 ENCOUNTER — Other Ambulatory Visit (HOSPITAL_BASED_OUTPATIENT_CLINIC_OR_DEPARTMENT_OTHER): Payer: Self-pay

## 2020-11-11 MED ORDER — COVID-19 MRNA VACCINE (PFIZER) 30 MCG/0.3ML IM SUSP
INTRAMUSCULAR | 0 refills | Status: DC
  Filled 2020-11-11: qty 0.3, 1d supply, fill #0

## 2020-11-21 ENCOUNTER — Other Ambulatory Visit: Payer: Self-pay | Admitting: Oncology

## 2020-11-26 ENCOUNTER — Inpatient Hospital Stay: Payer: Medicare Other

## 2020-11-26 ENCOUNTER — Other Ambulatory Visit: Payer: Self-pay

## 2020-11-26 ENCOUNTER — Ambulatory Visit (HOSPITAL_BASED_OUTPATIENT_CLINIC_OR_DEPARTMENT_OTHER)
Admission: RE | Admit: 2020-11-26 | Discharge: 2020-11-26 | Disposition: A | Discharge status: Home / Self Care | Payer: Medicare Other | Source: Ambulatory Visit | Attending: Oncology | Admitting: Oncology

## 2020-11-26 ENCOUNTER — Inpatient Hospital Stay (HOSPITAL_BASED_OUTPATIENT_CLINIC_OR_DEPARTMENT_OTHER): Payer: Medicare Other | Admitting: Oncology

## 2020-11-26 VITALS — BP 168/77 | HR 63 | Temp 98.1°F | Resp 20 | Ht 65.0 in | Wt 181.4 lb

## 2020-11-26 DIAGNOSIS — C3492 Malignant neoplasm of unspecified part of left bronchus or lung: Secondary | ICD-10-CM

## 2020-11-26 DIAGNOSIS — Z95828 Presence of other vascular implants and grafts: Secondary | ICD-10-CM

## 2020-11-26 DIAGNOSIS — Z5112 Encounter for antineoplastic immunotherapy: Secondary | ICD-10-CM | POA: Diagnosis not present

## 2020-11-26 LAB — CMP (CANCER CENTER ONLY)
ALT: 15 U/L (ref 0–44)
AST: 18 U/L (ref 15–41)
Albumin: 4.6 g/dL (ref 3.5–5.0)
Alkaline Phosphatase: 74 U/L (ref 38–126)
Anion gap: 7 (ref 5–15)
BUN: 19 mg/dL (ref 8–23)
CO2: 26 mmol/L (ref 22–32)
Calcium: 9.8 mg/dL (ref 8.9–10.3)
Chloride: 104 mmol/L (ref 98–111)
Creatinine: 0.72 mg/dL (ref 0.44–1.00)
GFR, Estimated: >60 mL/min (ref >60)
Glucose, Bld: 110 mg/dL — ABNORMAL HIGH (ref 70–99)
Potassium: 4 mmol/L (ref 3.5–5.1)
Sodium: 137 mmol/L (ref 135–145)
Total Bilirubin: 0.4 mg/dL (ref 0.3–1.2)
Total Protein: 7.1 g/dL (ref 6.5–8.1)

## 2020-11-26 LAB — CBC WITH DIFFERENTIAL (CANCER CENTER ONLY)
Abs Immature Granulocytes: 0.02 10*3/uL (ref 0.00–0.07)
Basophils Absolute: 0 10*3/uL (ref 0.0–0.1)
Basophils Relative: 0 %
Eosinophils Absolute: 0.1 10*3/uL (ref 0.0–0.5)
Eosinophils Relative: 1 %
HCT: 39 % (ref 36.0–46.0)
Hemoglobin: 12.5 g/dL (ref 12.0–15.0)
Immature Granulocytes: 0 %
Lymphocytes Relative: 17 %
Lymphs Abs: 1.1 10*3/uL (ref 0.7–4.0)
MCH: 30.6 pg (ref 26.0–34.0)
MCHC: 32.1 g/dL (ref 30.0–36.0)
MCV: 95.6 fL (ref 80.0–100.0)
Monocytes Absolute: 0.6 10*3/uL (ref 0.1–1.0)
Monocytes Relative: 9 %
Neutro Abs: 4.8 10*3/uL (ref 1.7–7.7)
Neutrophils Relative %: 73 %
Platelet Count: 155 10*3/uL (ref 150–400)
RBC: 4.08 MIL/uL (ref 3.87–5.11)
RDW: 12.6 % (ref 11.5–15.5)
WBC Count: 6.6 10*3/uL (ref 4.0–10.5)
nRBC: 0 % (ref 0.0–0.2)

## 2020-11-26 LAB — TSH: TSH: 1.122 u[IU]/mL (ref 0.350–4.500)

## 2020-11-26 MED ORDER — SODIUM CHLORIDE 0.9% FLUSH
10.0000 mL | Freq: Once | INTRAVENOUS | Status: AC
Start: 2020-11-26 — End: 2020-11-26
  Administered 2020-11-26: 10 mL
  Filled 2020-11-26: qty 10

## 2020-11-26 MED ORDER — SODIUM CHLORIDE 0.9 % IV SOLN
200.0000 mg | Freq: Once | INTRAVENOUS | Status: AC
  Administered 2020-11-26: 200 mg via INTRAVENOUS
  Filled 2020-11-26: qty 8

## 2020-11-26 MED ORDER — HEPARIN SOD (PORK) LOCK FLUSH 100 UNIT/ML IV SOLN
500.0000 [IU] | Freq: Once | INTRAVENOUS | Status: DC
  Filled 2020-11-26: qty 5

## 2020-11-26 MED ORDER — SODIUM CHLORIDE 0.9% FLUSH
10.0000 mL | INTRAVENOUS | Status: DC | PRN
  Administered 2020-11-26: 10 mL
  Filled 2020-11-26: qty 10

## 2020-11-26 MED ORDER — HEPARIN SOD (PORK) LOCK FLUSH 100 UNIT/ML IV SOLN
500.0000 [IU] | Freq: Once | INTRAVENOUS | Status: AC | PRN
  Administered 2020-11-26: 500 [IU]
  Filled 2020-11-26: qty 5

## 2020-11-26 MED ORDER — SODIUM CHLORIDE 0.9 % IV SOLN
Freq: Once | INTRAVENOUS | Status: AC
  Filled 2020-11-26: qty 250

## 2020-11-26 NOTE — Progress Notes (Signed)
[facility] OFFICE PROGRESS NOTE   Diagnosis: Non-small cell lung cancer  INTERVAL HISTORY:   [name] was last treated with pembrolizumab on 11/03/2020.  She complains of a cough for the past 2 days.  The cough is waking her up at night.  The cough is productive of a yellow sputum.  She has mild dyspnea.  No fever.  Stable pain.  Objective:  Vital signs in last 24 hours:  Blood pressure (!) 168/77, pulse 63, temperature 98.1 F (36.7 C), temperature source Oral, resp. rate 20, height $RemoveBe'5\' 5"'brOYiAvNu$  (1.651 m), weight 181 lb 6.4 oz (82.3 kg), SpO2 97 %.    Lymphatics: No cervical or supraclavicular nodes Resp: End inspiratory rales at the right posterior base, no respiratory distress Cardio: Regular rate and rhythm GI: No hepatosplenomegaly, nontender Vascular: No leg edema, the left lower leg is slightly larger than the right side    Portacath/PICC-without erythema  Lab Results:  Lab Results  Component Value Date   WBC 6.6 11/26/2020   HGB 12.5 11/26/2020   HCT 39.0 11/26/2020   MCV 95.6 11/26/2020   PLT 155 11/26/2020   NEUTROABS 4.8 11/26/2020    CMP  Lab Results  Component Value Date   NA 137 11/26/2020   K 4.0 11/26/2020   CL 104 11/26/2020   CO2 26 11/26/2020   GLUCOSE 110 (H) 11/26/2020   BUN 19 11/26/2020   CREATININE 0.72 11/26/2020   CALCIUM 9.8 11/26/2020   PROT 7.1 11/26/2020   ALBUMIN 4.6 11/26/2020   AST 18 11/26/2020   ALT 15 11/26/2020   ALKPHOS 74 11/26/2020   BILITOT 0.4 11/26/2020   GFRNONAA >60 11/26/2020   GFRAA >60 04/21/2020    No results found.  Medications: I have reviewed the patient's current medications.   Assessment/Plan: 1. Non-small cell lung cancer  MRI lumbar spine 04/29/2019-enlarging marrow lesions involving the L1 vertebral body, upper left sacrum and right iliac bone  MRI pelvis 04/29/2019-3.5 cm left iliac bone lesion appears slightly larger; other similar appearing lesions present within the left  superior pubic ramus, left superior abdomen acetabulum and upper left sacrum  Kappa free light chains with mild elevation 05/12/2019  CTs10/07/2019-left lower lobe pulmonary mass 3.3 x 3.2 cm; lytic process left iliac bone; spinal lesions; 1.1 cm low-density left kidney lesion; right thyroid enlargement with heterogeneous appearance with potential for multiple discrete lesions  Biopsy left lower lobe lung mass 05/26/2019-poorly differentiated carcinoma; positive for cytokeratin 5/6, p63 and TTF-1, no EGFR, BRAF, ALK,ERBB2,ROS, orNTRKalteration  Cycle 1 carboplatin/Alimta/pembrolizumab 06/06/2019  Cycle 2 carboplatin/Alimta/pembrolizumab 06/27/2019  Cycle 3 carboplatin/Alimta/pembrolizumab 07/18/2019  Cycle 4 carboplatin/Alimta/pembrolizumab1/01/2020  CTs 08/27/2019-significant decrease in size of lobulated mass left lower lobe. Unchanged appearance of subtle bone lesions.  Cycle 5 Alimta/pembrolizumab 08/28/2019  Cycle 6 Alimta/pembrolizumab 09/18/2019  Cycle 7 Alimta/pembrolizumab 10/09/2019  Cycle 8 Alimta/pembrolizumab 10/30/2019  Cycle 9 Alimta/pembrolizumab 11/20/2019  CTs 12/09/2019-no evidence of disease progression, left lower lobe nodule slightly decreased in size, stable L1, left sacral, and left pubic ramus metastases  Cycle 10 Alimta/pembrolizumab 12/11/2019  Cycle 11 pembrolizumab alone 01/02/2020 (Alimta held due to edema, tenderness, erythema at the lower legs)  Cycle 12 pembrolizumab 01/23/2020  Cycle 13 pembrolizumab 02/12/2020  Cycle 14 pembrolizumab 03/04/2020  CTs 03/18/2020-stable left lower lobe lesion, mild sclerosis at the superior endplate of L1 that was previously hypermetabolic, stable small left upper sacral lucent lesion, previous left superior pubic ramus lesion is occult on the CT, CT head negative for malignancy  Cycle 15  pembrolizumab 03/25/2020  Cycle 16 pembrolizumab 04/21/2020  Cycle 17 pembrolizumab 05/17/2020  05/19/2020 bone scan-no definite  abnormalities to suggest osseous metastases. Areas of concern on prior PET-CT involving left iliac bone and L1 vertebral body showed no abnormalities on the current study  Cycle 18 Pembrolizumab 06/10/2020  Cycle 19 Pembrolizumab 06/30/2020  CTs 07/16/2020-stable left lower lobe nodule, stable faint superior L1 vertebral lesion, no evidence of disease progression  Cycle 20 pembrolizumab 07/21/2020  Cycle 21 Pembrolizumab 08/11/2020  Cycle 22 pembrolizumab 09/01/2020  Cycle 23 Pembrolizumab 09/22/2020  Cycle 24 pembrolizumab 10/13/2020  Cycle 25 Pembrolizumab 11/03/2020  Cycle 26 pembrolizumab 11/26/2020 2. Pain secondary to #1, improved 3. Chronic back pain 4. Type 2 diabetes 5. Essential hypertension 6. CAD 7. Hyperlipidemia 8. Family history significant for multiple members with breast cancer 9. Grade 1 skin rash 07/18/2019 likely related to immunotherapy. Topical steroid cream as needed. 10. E. coli urinary tract infection 07/14/2019. Completed cephalexin. 11. Edema/tenderness at the right greater than left ankle 10/21/2019-etiology unclear, potentially related to systemic therapy or an infection, doxycycline prescribed-improved 10/23/2019; marked improvement 11/20/2019; at office visit 01/02/2020 she reports worsening of lower extremity edema, pain/tenderness, erythema 3 to 4 days following each treatment. Alimta held 01/02/2020. Referral to dermatology. 12. COVID-19 infection 03/30/2020, monoclonal antibody therapy 04/06/2020     Disposition: Ms. Duva continues treatment with pembrolizumab.  There is no evidence for progression of the non-small cell lung cancer.  She will complete another treatment of pembrolizumab today.  She will undergo a restaging CT evaluation prior to an office visit in 3 weeks.  She presents today with a cough and mild dyspnea.  I have a low clinical suspicion for COVID-19 infection or pneumonia.  We will refer her for a chest x-ray to look for evidence of  pneumonia.  [name], MD  11/26/2020  10:12 AM

## 2020-11-26 NOTE — Patient Instructions (Signed)
[facility]    Discharge Instructions:  Thank you for choosing [facility] to provide your oncology and hematology care.   If you have a lab appointment with the [facility], please go directly to the [facility] and check in at the registration area.   Wear comfortable clothing and clothing appropriate for easy access to any Portacath or PICC line.   We strive to give you quality time with your provider. You may need to reschedule your appointment if you arrive late (15 or more minutes).  Arriving late affects you and other patients whose appointments are after yours.  Also, if you miss three or more appointments without notifying the office, you may be dismissed from the clinic at the provider's discretion.      For prescription refill requests, have your pharmacy contact our office and allow 72 hours for refills to be completed.    Today you received the following chemotherapy and/or immunotherapy agents Pembrolizumab (KEYTRUDA).   To help prevent nausea and vomiting after your treatment, we encourage you to take your nausea medication as directed.  BELOW ARE SYMPTOMS THAT SHOULD BE REPORTED IMMEDIATELY: . *FEVER GREATER THAN 100.4 F (38 C) OR HIGHER . *CHILLS OR SWEATING . *NAUSEA AND VOMITING THAT IS NOT CONTROLLED WITH YOUR NAUSEA MEDICATION . *UNUSUAL SHORTNESS OF BREATH . *UNUSUAL BRUISING OR BLEEDING . *URINARY PROBLEMS (pain or burning when urinating, or frequent urination) . *BOWEL PROBLEMS (unusual diarrhea, constipation, pain near the anus) . TENDERNESS IN MOUTH AND THROAT WITH OR WITHOUT PRESENCE OF ULCERS (sore throat, sores in mouth, or a toothache) . UNUSUAL RASH, SWELLING OR PAIN  . UNUSUAL VAGINAL DISCHARGE OR ITCHING   Items with * indicate a potential emergency and should be followed up as soon as possible or go to the Emergency Department if any problems should occur.  Please show the CHEMOTHERAPY ALERT CARD or  IMMUNOTHERAPY ALERT CARD at check-in to the Emergency Department and triage nurse.  Should you have questions after your visit or need to cancel or reschedule your appointment, please contact [facility]  Dept: [phone]  and follow the prompts.  Office hours are 8:00 a.m. to 4:30 p.m. Monday - Friday. Please note that voicemails left after 4:00 p.m. may not be returned until the following business day.  We are closed weekends and major holidays. You have access to a nurse at all times for urgent questions. Please call the main number to the clinic Dept: [phone] and follow the prompts.   For any non-urgent questions, you may also contact your provider using MyChart. We now offer e-Visits for anyone 79 and older to request care online for non-urgent symptoms. For details visit [email or web address].   Also download the MyChart app! Go to the app store, search "MyChart", open the app, select [facility], and log in with your MyChart username and password.  Due to Covid, a mask is required upon entering the hospital/clinic. If you do not have a mask, one will be given to you upon arrival. For doctor visits, patients may have 1 support person aged 45 or older with them. For treatment visits, patients cannot have anyone with them due to current Covid guidelines and our immunocompromised population.

## 2020-11-29 ENCOUNTER — Telehealth: Payer: Self-pay | Admitting: Oncology

## 2020-11-29 ENCOUNTER — Encounter: Payer: Self-pay | Admitting: Oncology

## 2020-11-29 NOTE — Telephone Encounter (Signed)
Called spoke with patient regarding lab & PF appointments scheduled for 5/18 prior to CT.Marland Kitchen  She voiced understanding of these times. Per 4/29 sch msg

## 2020-11-30 ENCOUNTER — Telehealth: Payer: Self-pay

## 2020-11-30 NOTE — Telephone Encounter (Signed)
-----   Message from Ladell Pier, MD sent at 11/29/2020  4:35 PM EDT ----- Please call patient, chest x-ray shows no evidence of pneumonia

## 2020-11-30 NOTE — Telephone Encounter (Signed)
TC to Pt to inform her of negative x-ray results. Pt verbalized understanding. Pt still not feeling well stated she went to her PCP who also did a chest x-ray and it was negative. Pt stated she was given an antibiotic by PCP and stated hopefully this will help her. Informed Pt that she could give [place] a call if she needed too. Pt. Verbalized understanding. No further problems or concerns noted.

## 2020-12-12 ENCOUNTER — Other Ambulatory Visit: Payer: Self-pay | Admitting: Oncology

## 2020-12-15 ENCOUNTER — Inpatient Hospital Stay: Payer: Medicare Other | Attending: Oncology

## 2020-12-15 ENCOUNTER — Other Ambulatory Visit: Payer: Self-pay

## 2020-12-15 ENCOUNTER — Ambulatory Visit (HOSPITAL_BASED_OUTPATIENT_CLINIC_OR_DEPARTMENT_OTHER)
Admission: RE | Admit: 2020-12-15 | Discharge: 2020-12-15 | Disposition: A | Discharge status: Home / Self Care | Payer: Medicare Other | Source: Ambulatory Visit | Attending: Oncology | Admitting: Oncology

## 2020-12-15 ENCOUNTER — Inpatient Hospital Stay: Payer: Medicare Other

## 2020-12-15 ENCOUNTER — Encounter (HOSPITAL_BASED_OUTPATIENT_CLINIC_OR_DEPARTMENT_OTHER): Payer: Self-pay

## 2020-12-15 DIAGNOSIS — C3492 Malignant neoplasm of unspecified part of left bronchus or lung: Secondary | ICD-10-CM | POA: Insufficient documentation

## 2020-12-15 DIAGNOSIS — Z5112 Encounter for antineoplastic immunotherapy: Secondary | ICD-10-CM | POA: Insufficient documentation

## 2020-12-15 DIAGNOSIS — Z79899 Other long term (current) drug therapy: Secondary | ICD-10-CM | POA: Diagnosis not present

## 2020-12-15 DIAGNOSIS — C7951 Secondary malignant neoplasm of bone: Secondary | ICD-10-CM | POA: Diagnosis present

## 2020-12-15 DIAGNOSIS — C3432 Malignant neoplasm of lower lobe, left bronchus or lung: Secondary | ICD-10-CM | POA: Insufficient documentation

## 2020-12-15 DIAGNOSIS — Z95828 Presence of other vascular implants and grafts: Secondary | ICD-10-CM

## 2020-12-15 LAB — CBC WITH DIFFERENTIAL (CANCER CENTER ONLY)
Abs Immature Granulocytes: 0.01 K/uL (ref 0.00–0.07)
Basophils Absolute: 0 K/uL (ref 0.0–0.1)
Basophils Relative: 0 %
Eosinophils Absolute: 0.1 K/uL (ref 0.0–0.5)
Eosinophils Relative: 2 %
HCT: 36.1 % (ref 36.0–46.0)
Hemoglobin: 11.8 g/dL — ABNORMAL LOW (ref 12.0–15.0)
Immature Granulocytes: 0 %
Lymphocytes Relative: 25 %
Lymphs Abs: 1.2 K/uL (ref 0.7–4.0)
MCH: 31.1 pg (ref 26.0–34.0)
MCHC: 32.7 g/dL (ref 30.0–36.0)
MCV: 95.3 fL (ref 80.0–100.0)
Monocytes Absolute: 0.4 K/uL (ref 0.1–1.0)
Monocytes Relative: 9 %
Neutro Abs: 3 K/uL (ref 1.7–7.7)
Neutrophils Relative %: 64 %
Platelet Count: 183 K/uL (ref 150–400)
RBC: 3.79 MIL/uL — ABNORMAL LOW (ref 3.87–5.11)
RDW: 12.9 % (ref 11.5–15.5)
WBC Count: 4.7 K/uL (ref 4.0–10.5)
nRBC: 0 % (ref 0.0–0.2)

## 2020-12-15 LAB — CMP (CANCER CENTER ONLY)
ALT: 14 U/L (ref 0–44)
AST: 16 U/L (ref 15–41)
Albumin: 4.1 g/dL (ref 3.5–5.0)
Alkaline Phosphatase: 62 U/L (ref 38–126)
Anion gap: 7 (ref 5–15)
BUN: 18 mg/dL (ref 8–23)
CO2: 25 mmol/L (ref 22–32)
Calcium: 9.2 mg/dL (ref 8.9–10.3)
Chloride: 104 mmol/L (ref 98–111)
Creatinine: 0.88 mg/dL (ref 0.44–1.00)
GFR, Estimated: >60 mL/min (ref >60)
Glucose, Bld: 104 mg/dL — ABNORMAL HIGH (ref 70–99)
Potassium: 4.6 mmol/L (ref 3.5–5.1)
Sodium: 136 mmol/L (ref 135–145)
Total Bilirubin: 0.3 mg/dL (ref 0.3–1.2)
Total Protein: 6.5 g/dL (ref 6.5–8.1)

## 2020-12-15 LAB — TSH: TSH: 0.872 u[IU]/mL (ref 0.350–4.500)

## 2020-12-15 IMAGING — CT CT ABD-PELV W/ CM
2 of 5 series · 13 of 36 positions shown, 16 images · IV contrast (APPLIED)
Comparison: CT chest abdomen pelvis, 05/12/2019, PET-CT,
05/19/2019, MR lumbar spine, 04/29/2019

CLINICAL DATA: Restaging lung cancer

EXAM:
CT CHEST, ABDOMEN, AND PELVIS WITH CONTRAST
TECHNIQUE: Multidetector CT imaging of the chest, abdomen and pelvis was
performed following the standard protocol during bolus
administration of intravenous contrast.
CONTRAST:  100mL OMNIPAQUE IOHEXOL 300 MG/ML SOLN, additional oral
enteric contrast

[Series 2: cap with · axial · 0.80mm/px · z∈[-649,-119]mm · 10 of 130 slices shown, 13 images]
[im 12/130  mediastinal]
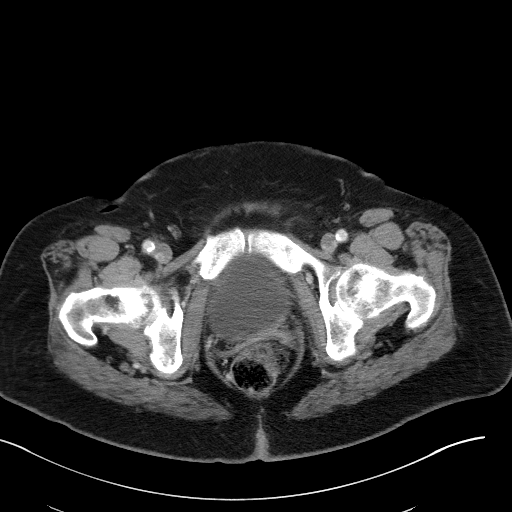
[im 12/130  lung]
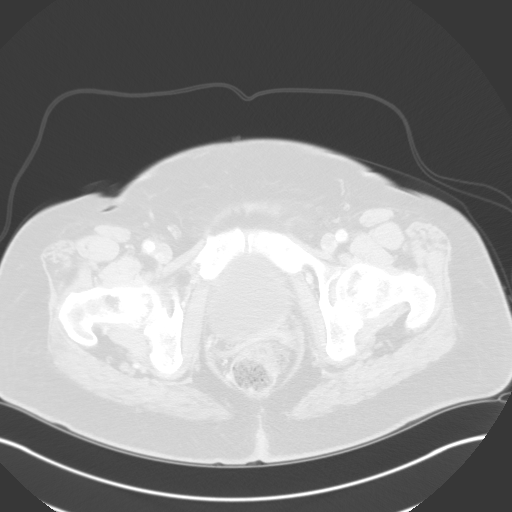
[im 24/130  lung]
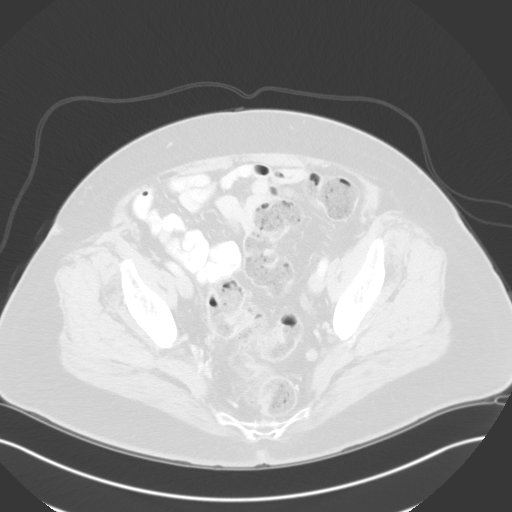
[im 36/130  lung]
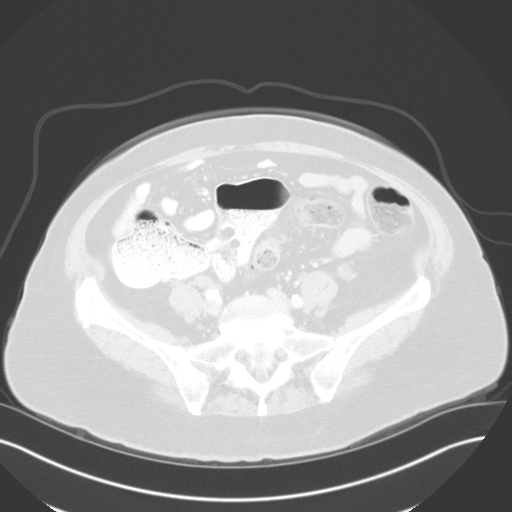
[im 47/130  lung]
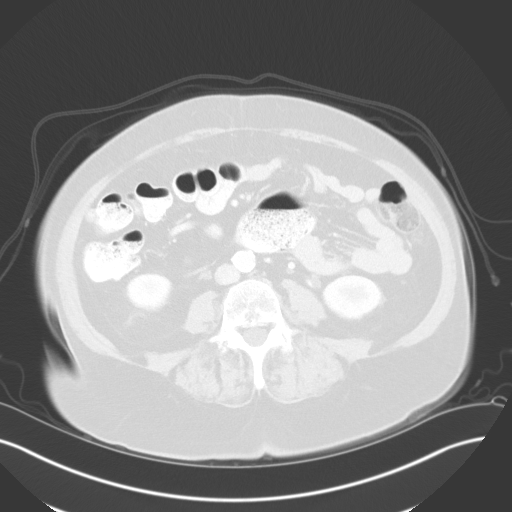
[im 59/130  mediastinal]
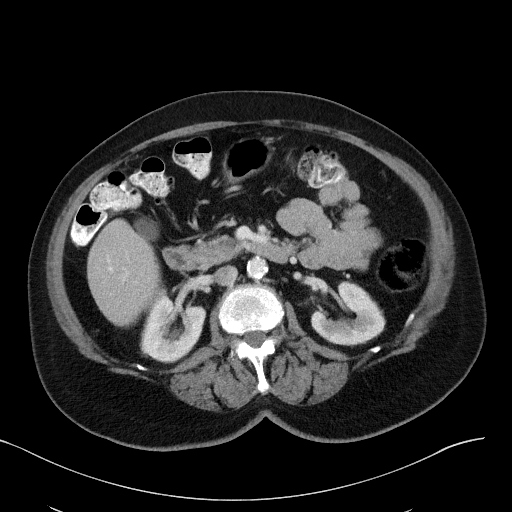
[im 59/130  lung]
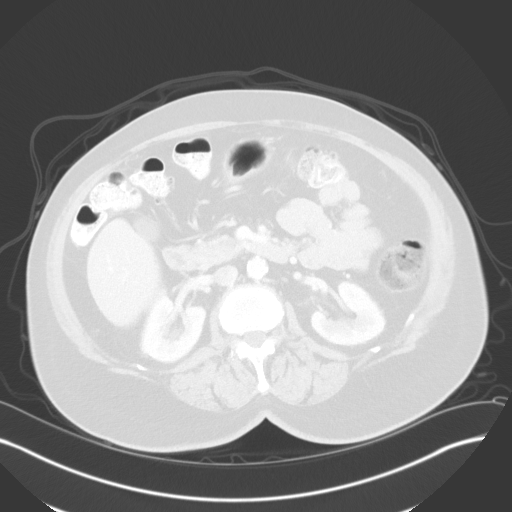
[im 71/130  lung]
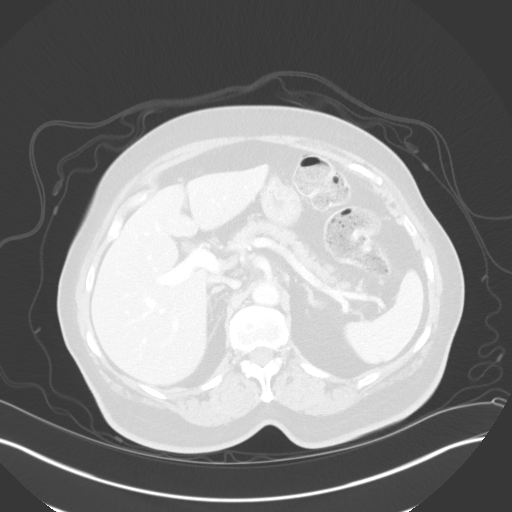
[im 83/130  lung]
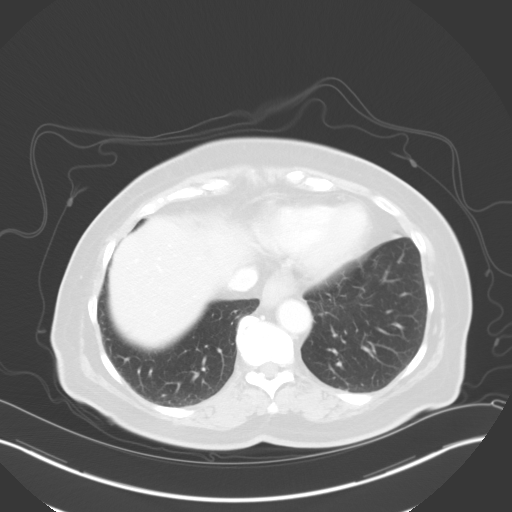
[im 94/130  lung]
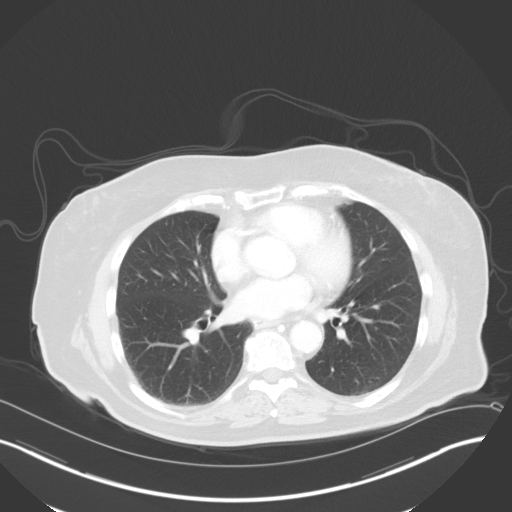
[im 106/130  mediastinal]
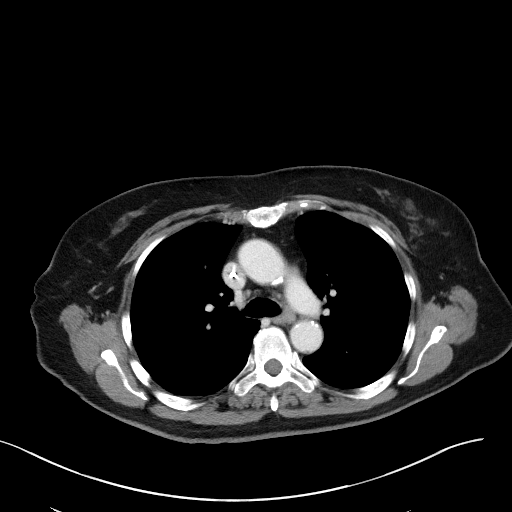
[im 106/130  lung]
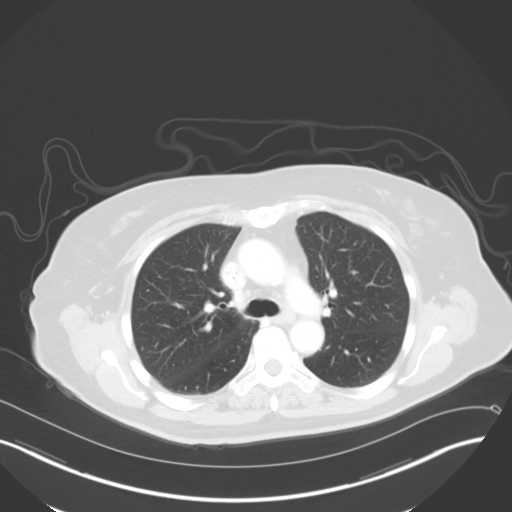
[im 118/130  lung]
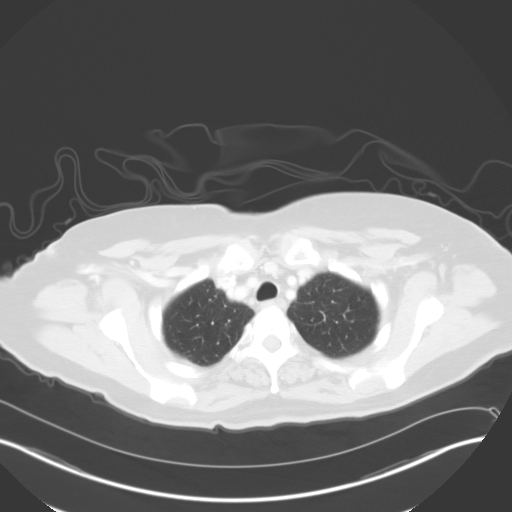

[Series 5: coronals · coronal · 0.82mm/px · 3 of 145 slices shown]
[im 29/145  lung]
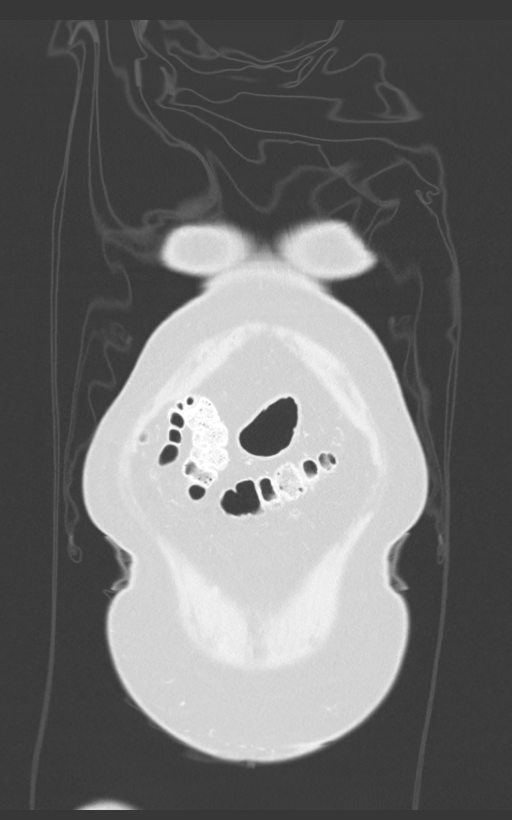
[im 58/145  lung]
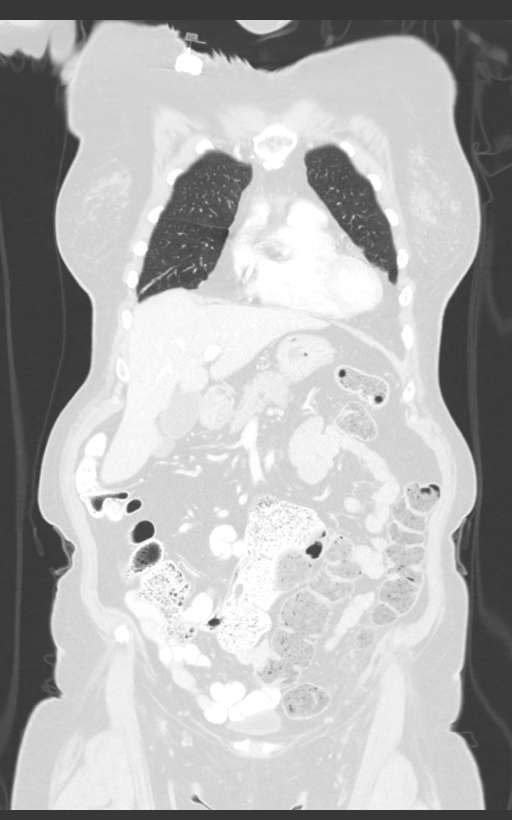
[im 87/145  lung]
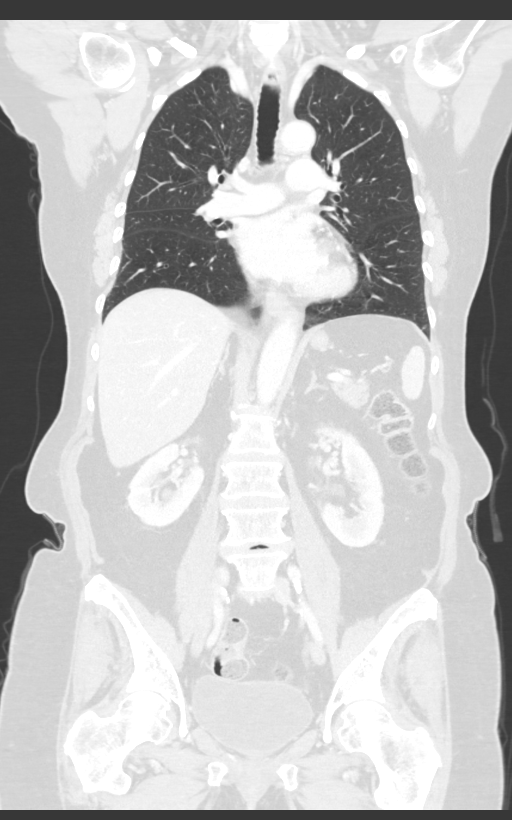

[13 of 36 positions shown; findings below may reference images not displayed]

FINDINGS: CT CHEST FINDINGS

Cardiovascular: Right chest port catheter. Aortic atherosclerosis.
Normal heart size. Three-vessel coronary artery calcifications. No
pericardial effusion.

Mediastinum/Nodes: No enlarged mediastinal, hilar, or axillary lymph
nodes. Heterogeneous multinodular thyroid. Trachea, and esophagus
demonstrate no significant findings.

Lungs/Pleura: Significant interval decrease in size in a lobulated
mass of the dependent left lower lobe, the dominant component now
measuring approximately 2.0 x 1.5 cm, previously 3.3 x 3.2 cm when
measured similarly (series 4, image 99). No pleural effusion or
pneumothorax.

Musculoskeletal: No chest wall mass or suspicious bone lesions
identified.

CT ABDOMEN PELVIS FINDINGS

Hepatobiliary: No solid liver abnormality is seen. No gallstones,
gallbladder wall thickening, or biliary dilatation.

Pancreas: Unremarkable. No pancreatic ductal dilatation or
surrounding inflammatory changes.

Spleen: Normal in size without significant abnormality.

Adrenals/Urinary Tract: Adrenal glands are unremarkable. Kidneys are
normal, without renal calculi, solid lesion, or hydronephrosis.
Bladder is unremarkable.

Stomach/Bowel: Stomach is within normal limits. Appendix is not
clearly visualized and may be surgically absent. No evidence of
bowel wall thickening, distention, or inflammatory changes. Large
burden of stool in the colon.

Vascular/Lymphatic: No significant vascular findings are present. No
enlarged abdominal or pelvic lymph nodes.

Reproductive: Status post hysterectomy.

Other: No abdominal wall hernia or abnormality. No abdominopelvic
ascites.

Musculoskeletal: Unchanged appearance of subtle osseous lesions,
generally better assessed by prior MRI examination dated 04/29/2019
without high level FDG avidity on prior PET-CT, including lucent
lesions of the left aspect of S1 (series 2, image 95 and the left
iliac crest (series 2, image 105). Generally bland appearing Schmorl
type superior endplate deformity of L1 (series 6, image 90). A
previously described lucent lesion of the left scapula is not well
appreciated.
IMPRESSION: 1. Significant interval decrease in size of lobulated mass of the
dependent left lower lobe, the dominant component now measuring
x 1.5 cm, previously 3.3 x 3.2 cm. Findings are consistent with
treatment response of primary lung malignancy.
2. Unchanged appearance of subtle osseous lesions, generally better
assessed by prior MRI examination dated 04/29/2019 without high
level FDG avidity on prior PET-CT. A previously described lucent
lesion of the left scapula is not well appreciated. Findings remain
nonspecific although concerning for osseous metastatic disease.
Attention on follow-up.
3. Coronary artery disease.  Aortic Atherosclerosis ([id number]).

## 2020-12-15 MED ORDER — HEPARIN SOD (PORK) LOCK FLUSH 100 UNIT/ML IV SOLN
500.0000 [IU] | Freq: Once | INTRAVENOUS | Status: AC
  Administered 2020-12-15: 500 [IU] via INTRAVENOUS
  Filled 2020-12-15: qty 5

## 2020-12-15 MED ORDER — SODIUM CHLORIDE 0.9% FLUSH
10.0000 mL | Freq: Once | INTRAVENOUS | Status: AC
  Administered 2020-12-15: 10 mL via INTRAVENOUS
  Filled 2020-12-15: qty 10

## 2020-12-15 MED ORDER — IOHEXOL 300 MG/ML  SOLN
100.0000 mL | Freq: Once | INTRAMUSCULAR | Status: AC | PRN
  Administered 2020-12-15: 100 mL via INTRAVENOUS

## 2020-12-15 NOTE — Addendum Note (Signed)
Addended by: Quita Skye on: 12/15/2020 09:31 AM   Modules accepted: Orders

## 2020-12-17 ENCOUNTER — Other Ambulatory Visit: Payer: Medicare Other

## 2020-12-17 ENCOUNTER — Inpatient Hospital Stay: Payer: Medicare Other

## 2020-12-17 ENCOUNTER — Inpatient Hospital Stay (HOSPITAL_BASED_OUTPATIENT_CLINIC_OR_DEPARTMENT_OTHER): Payer: Medicare Other | Admitting: Oncology

## 2020-12-17 ENCOUNTER — Other Ambulatory Visit: Payer: Self-pay

## 2020-12-17 VITALS — BP 147/73 | HR 60 | Temp 98.0°F | Resp 18 | Ht 65.0 in | Wt 181.2 lb

## 2020-12-17 DIAGNOSIS — C3492 Malignant neoplasm of unspecified part of left bronchus or lung: Secondary | ICD-10-CM | POA: Diagnosis not present

## 2020-12-17 DIAGNOSIS — Z5112 Encounter for antineoplastic immunotherapy: Secondary | ICD-10-CM | POA: Diagnosis not present

## 2020-12-17 MED ORDER — SODIUM CHLORIDE 0.9 % IV SOLN
Freq: Once | INTRAVENOUS | Status: AC
Start: 2020-12-17 — End: 2020-12-17
  Filled 2020-12-17: qty 250

## 2020-12-17 MED ORDER — SODIUM CHLORIDE 0.9 % IV SOLN
Freq: Once | INTRAVENOUS | Status: AC
  Filled 2020-12-17: qty 250

## 2020-12-17 MED ORDER — SODIUM CHLORIDE 0.9% FLUSH
10.0000 mL | INTRAVENOUS | Status: DC | PRN
  Administered 2020-12-17: 10 mL
  Filled 2020-12-17: qty 10

## 2020-12-17 MED ORDER — HEPARIN SOD (PORK) LOCK FLUSH 100 UNIT/ML IV SOLN
500.0000 [IU] | Freq: Once | INTRAVENOUS | Status: AC | PRN
  Administered 2020-12-17: 500 [IU]
  Filled 2020-12-17: qty 5

## 2020-12-17 MED ORDER — SODIUM CHLORIDE 0.9 % IV SOLN
200.0000 mg | Freq: Once | INTRAVENOUS | Status: AC
  Administered 2020-12-17: 200 mg via INTRAVENOUS
  Filled 2020-12-17: qty 8

## 2020-12-17 NOTE — Patient Instructions (Signed)
[facility]    Discharge Instructions:  Thank you for choosing [facility] to provide your oncology and hematology care.   If you have a lab appointment with the [facility], please go directly to the [facility] and check in at the registration area.   Wear comfortable clothing and clothing appropriate for easy access to any Portacath or PICC line.   We strive to give you quality time with your provider. You may need to reschedule your appointment if you arrive late (15 or more minutes).  Arriving late affects you and other patients whose appointments are after yours.  Also, if you miss three or more appointments without notifying the office, you may be dismissed from the clinic at the provider's discretion.      For prescription refill requests, have your pharmacy contact our office and allow 72 hours for refills to be completed.    Today you received the following chemotherapy and/or immunotherapy agents Pembrolizumab (KEYTRUDA).   To help prevent nausea and vomiting after your treatment, we encourage you to take your nausea medication as directed.  BELOW ARE SYMPTOMS THAT SHOULD BE REPORTED IMMEDIATELY: . *FEVER GREATER THAN 100.4 F (38 C) OR HIGHER . *CHILLS OR SWEATING . *NAUSEA AND VOMITING THAT IS NOT CONTROLLED WITH YOUR NAUSEA MEDICATION . *UNUSUAL SHORTNESS OF BREATH . *UNUSUAL BRUISING OR BLEEDING . *URINARY PROBLEMS (pain or burning when urinating, or frequent urination) . *BOWEL PROBLEMS (unusual diarrhea, constipation, pain near the anus) . TENDERNESS IN MOUTH AND THROAT WITH OR WITHOUT PRESENCE OF ULCERS (sore throat, sores in mouth, or a toothache) . UNUSUAL RASH, SWELLING OR PAIN  . UNUSUAL VAGINAL DISCHARGE OR ITCHING   Items with * indicate a potential emergency and should be followed up as soon as possible or go to the Emergency Department if any problems should occur.  Please show the CHEMOTHERAPY ALERT CARD or  IMMUNOTHERAPY ALERT CARD at check-in to the Emergency Department and triage nurse.  Should you have questions after your visit or need to cancel or reschedule your appointment, please contact [facility]  Dept: [phone]  and follow the prompts.  Office hours are 8:00 a.m. to 4:30 p.m. Monday - Friday. Please note that voicemails left after 4:00 p.m. may not be returned until the following business day.  We are closed weekends and major holidays. You have access to a nurse at all times for urgent questions. Please call the main number to the clinic Dept: [phone] and follow the prompts.   For any non-urgent questions, you may also contact your provider using MyChart. We now offer e-Visits for anyone 31 and older to request care online for non-urgent symptoms. For details visit [email or web address].   Also download the MyChart app! Go to the app store, search "MyChart", open the app, select [facility], and log in with your MyChart username and password.  Due to Covid, a mask is required upon entering the hospital/clinic. If you do not have a mask, one will be given to you upon arrival. For doctor visits, patients may have 1 support person aged 30 or older with them. For treatment visits, patients cannot have anyone with them due to current Covid guidelines and our immunocompromised population.   Pembrolizumab injection What is this medicine? PEMBROLIZUMAB (pem broe liz ue mab) is a monoclonal antibody. It is used to treat certain types of cancer. This medicine may be used for other purposes; ask your health care provider  or pharmacist if you have questions. COMMON BRAND NAME(S): Keytruda What should I tell my health care provider before I take this medicine? They need to know if you have any of these conditions:  autoimmune diseases like Crohn's disease, ulcerative colitis, or lupus  have had or planning to have an allogeneic stem cell transplant (uses  someone else's stem cells)  history of organ transplant  history of chest radiation  nervous system problems like myasthenia gravis or Guillain-Barre syndrome  an unusual or allergic reaction to pembrolizumab, other medicines, foods, dyes, or preservatives  pregnant or trying to get pregnant  breast-feeding How should I use this medicine? This medicine is for infusion into a vein. It is given by a health care professional in a hospital or clinic setting. A special MedGuide will be given to you before each treatment. Be sure to read this information carefully each time. Talk to your pediatrician regarding the use of this medicine in children. While this drug may be prescribed for children as young as 6 months for selected conditions, precautions do apply. Overdosage: If you think you have taken too much of this medicine contact a poison control center or emergency room at once. NOTE: This medicine is only for you. Do not share this medicine with others. What if I miss a dose? It is important not to miss your dose. Call your doctor or health care professional if you are unable to keep an appointment. What may interact with this medicine? Interactions have not been studied. This list may not describe all possible interactions. Give your health care provider a list of all the medicines, herbs, non-prescription drugs, or dietary supplements you use. Also tell them if you smoke, drink alcohol, or use illegal drugs. Some items may interact with your medicine. What should I watch for while using this medicine? Your condition will be monitored carefully while you are receiving this medicine. You may need blood work done while you are taking this medicine. Do not become pregnant while taking this medicine or for 4 months after stopping it. Women should inform their doctor if they wish to become pregnant or think they might be pregnant. There is a potential for serious side effects to an unborn  child. Talk to your health care professional or pharmacist for more information. Do not breast-feed an infant while taking this medicine or for 4 months after the last dose. What side effects may I notice from receiving this medicine? Side effects that you should report to your doctor or health care professional as soon as possible:  allergic reactions like skin rash, itching or hives, swelling of the face, lips, or tongue  bloody or black, tarry  breathing problems  changes in vision  chest pain  chills  confusion  constipation  cough  diarrhea  dizziness or feeling faint or lightheaded  fast or irregular heartbeat  fever  flushing  joint pain  low blood counts - this medicine may decrease the number of white blood cells, red blood cells and platelets. You may be at increased risk for infections and bleeding.  muscle pain  muscle weakness  pain, tingling, numbness in the hands or feet  persistent headache  redness, blistering, peeling or loosening of the skin, including inside the mouth  signs and symptoms of high blood sugar such as dizziness; dry mouth; dry skin; fruity breath; nausea; stomach pain; increased hunger or thirst; increased urination  signs and symptoms of kidney injury like trouble passing urine or change in  the amount of urine  signs and symptoms of liver injury like dark urine, light-colored stools, loss of appetite, nausea, right upper belly pain, yellowing of the eyes or skin  sweating  swollen lymph nodes  weight loss Side effects that usually do not require medical attention (report to your doctor or health care professional if they continue or are bothersome):  decreased appetite  hair loss  tiredness This list may not describe all possible side effects. Call your doctor for medical advice about side effects. You may report side effects to FDA at 1-800-FDA-1088. Where should I keep my medicine? This drug is given in a hospital  or clinic and will not be stored at home. NOTE: This sheet is a summary. It may not cover all possible information. If you have questions about this medicine, talk to your doctor, pharmacist, or health care provider.  2021 Elsevier/Gold Standard (2019-06-18 21:44:53)

## 2020-12-17 NOTE — Progress Notes (Signed)
[facility] OFFICE PROGRESS NOTE   Diagnosis: Non-small cell lung cancer  INTERVAL HISTORY:   [name] completed another cycle of pembrolizumab on 11/26/2020.  No rash or diarrhea.  No new complaint.  She has increased pain in both hips and the back.  Injections have not helped the back pain.  She takes Tylenol twice daily and as needed naproxen.  Objective:  Vital signs in last 24 hours:  Blood pressure (!) 147/73, pulse 60, temperature 98 F (36.7 C), temperature source Oral, resp. rate 18, height _0  (1.651 m), weight 181 lb 3.2 oz (82.2 kg), SpO2 98 %.    Resp: Lungs clear bilaterally Cardio: Regular rate and rhythm GI: No hepatosplenomegaly Vascular: No leg edema Musculoskeletal: Tender at the trochanter bilaterally, no spine tenderness  Lab Results:  Lab Results  Component Value Date   WBC 4.7 12/15/2020   HGB 11.8 (L) 12/15/2020   HCT 36.1 12/15/2020   MCV 95.3 12/15/2020   PLT 183 12/15/2020   NEUTROABS 3.0 12/15/2020    CMP  Lab Results  Component Value Date   NA 136 12/15/2020   K 4.6 12/15/2020   CL 104 12/15/2020   CO2 25 12/15/2020   GLUCOSE 104 (H) 12/15/2020   BUN 18 12/15/2020   CREATININE 0.88 12/15/2020   CALCIUM 9.2 12/15/2020   PROT 6.5 12/15/2020   ALBUMIN 4.1 12/15/2020   AST 16 12/15/2020   ALT 14 12/15/2020   ALKPHOS 62 12/15/2020   BILITOT 0.3 12/15/2020   GFRNONAA >60 12/15/2020   GFRAA >60 04/21/2020     Imaging:  CT CHEST ABDOMEN PELVIS W CONTRAST  Result Date: 12/16/2020 CLINICAL DATA:  Restaging lung cancer metastasis. Follow bone lesions. Non-small cell cancer. EXAM: CT CHEST, ABDOMEN, AND PELVIS WITH CONTRAST TECHNIQUE: Multidetector CT imaging of the chest, abdomen and pelvis was performed following the standard protocol during bolus administration of intravenous contrast. CONTRAST:  145m OMNIPAQUE IOHEXOL 300 MG/ML  SOLN COMPARISON:  None. FINDINGS: CT CHEST FINDINGS Cardiovascular: Coronary artery  calcification and aortic atherosclerotic calcification. Mediastinum/Nodes: Port in the anterior chest wall with tip in distal SVC. No axillary or supraclavicular adenopathy. No mediastinal or hilar adenopathy. No pericardial fluid. Esophagus normal. Lungs/Pleura: Lobular nodule in the LEFT lower lobe measures 1.9 x 1.4 cm compared to 1.6 by 1.4 cm. Visually lesion appears unchanged. No new pulmonary nodularity. Musculoskeletal: No aggressive osseous lesion. CT ABDOMEN AND PELVIS FINDINGS Hepatobiliary: No focal hepatic lesion. No biliary ductal dilatation. Gallbladder is normal. Common bile duct is normal. Pancreas: Pancreas is normal. No ductal dilatation. No pancreatic inflammation. Spleen: Normal spleen Adrenals/urinary tract: Adrenal glands and kidneys are normal. The ureters and bladder normal. Stomach/Bowel: Stomach, small bowel, appendix, and cecum are normal. The colon and rectosigmoid colon are normal. Vascular/Lymphatic: Abdominal aorta is normal caliber. There is no retroperitoneal or periportal lymphadenopathy. No pelvic lymphadenopathy. Reproductive: Post hysterectomy.  Adnexa unremarkable Other: No peritoneal metastasis Musculoskeletal: Round sclerotic lesion L1 is unchanged. IMPRESSION: Chest Impression: 1. Stable lobular nodule in the LEFT lower lobe. 2. No mediastinal lymphadenopathy. 3. Coronary artery calcification and Aortic Atherosclerosis (ICD10-I70.0). Abdomen / Pelvis Impression: 1. No evidence of malignancy in the abdomen pelvis. 2. Stable sclerotic lesion L2. Electronically Signed   By: SSuzy BouchardM.D.   On: 12/16/2020 11:25    Medications: I have reviewed the patient's current medications.   Assessment/Plan:  1. Non-small cell lung cancer  MRI lumbar spine 04/29/2019-enlarging marrow lesions involving the L1 vertebral body, upper left sacrum  and right iliac bone  MRI pelvis 04/29/2019-3.5 cm left iliac bone lesion appears slightly larger; other similar appearing lesions  present within the left superior pubic ramus, left superior abdomen acetabulum and upper left sacrum  Kappa free light chains with mild elevation 05/12/2019  CTs10/07/2019-left lower lobe pulmonary mass 3.3 x 3.2 cm; lytic process left iliac bone; spinal lesions; 1.1 cm low-density left kidney lesion; right thyroid enlargement with heterogeneous appearance with potential for multiple discrete lesions  Biopsy left lower lobe lung mass 05/26/2019-poorly differentiated carcinoma; positive for cytokeratin 5/6, p63 and TTF-1, no EGFR, BRAF, ALK,ERBB2,ROS, orNTRKalteration  Cycle 1 carboplatin/Alimta/pembrolizumab 06/06/2019  Cycle 2 carboplatin/Alimta/pembrolizumab 06/27/2019  Cycle 3 carboplatin/Alimta/pembrolizumab 07/18/2019  Cycle 4 carboplatin/Alimta/pembrolizumab1/01/2020  CTs 08/27/2019-significant decrease in size of lobulated mass left lower lobe. Unchanged appearance of subtle bone lesions.  Cycle 5 Alimta/pembrolizumab 08/28/2019  Cycle 6 Alimta/pembrolizumab 09/18/2019  Cycle 7 Alimta/pembrolizumab 10/09/2019  Cycle 8 Alimta/pembrolizumab 10/30/2019  Cycle 9 Alimta/pembrolizumab 11/20/2019  CTs 12/09/2019-no evidence of disease progression, left lower lobe nodule slightly decreased in size, stable L1, left sacral, and left pubic ramus metastases  Cycle 10 Alimta/pembrolizumab 12/11/2019  Cycle 11 pembrolizumab alone 01/02/2020 (Alimta held due to edema, tenderness, erythema at the lower legs)  Cycle 12 pembrolizumab 01/23/2020  Cycle 13 pembrolizumab 02/12/2020  Cycle 14 pembrolizumab 03/04/2020  CTs 03/18/2020-stable left lower lobe lesion, mild sclerosis at the superior endplate of L1 that was previously hypermetabolic, stable small left upper sacral lucent lesion, previous left superior pubic ramus lesion is occult on the CT, CT head negative for malignancy  Cycle 15 pembrolizumab 03/25/2020  Cycle 16 pembrolizumab 04/21/2020  Cycle 17 pembrolizumab  05/17/2020  05/19/2020 bone scan-no definite abnormalities to suggest osseous metastases. Areas of concern on prior PET-CT involving left iliac bone and L1 vertebral body showed no abnormalities on the current study  Cycle 18 Pembrolizumab 06/10/2020  Cycle 19 Pembrolizumab 06/30/2020  CTs 07/16/2020-stable left lower lobe nodule, stable faint superior L1 vertebral lesion, no evidence of disease progression  Cycle 20 pembrolizumab 07/21/2020  Cycle 21 Pembrolizumab 08/11/2020  Cycle 22 pembrolizumab 09/01/2020  Cycle 23 Pembrolizumab 09/22/2020  Cycle 24 pembrolizumab 10/13/2020  Cycle 25 Pembrolizumab 11/03/2020  Cycle 26 pembrolizumab 11/26/2020  CTs 12/15/2020- stable left lower lobe mass, stable sclerotic lesion at L2, no evidence of disease progression  Cycle 27 pembrolizumab 12/17/2020 2. Pain secondary to #1, improved 3. Chronic back pain 4. Type 2 diabetes 5. Essential hypertension 6. CAD 7. Hyperlipidemia 8. Family history significant for multiple members with breast cancer 9. Grade 1 skin rash 07/18/2019 likely related to immunotherapy. Topical steroid cream as needed. 10. E. coli urinary tract infection 07/14/2019. Completed cephalexin. 11. Edema/tenderness at the right greater than left ankle 10/21/2019-etiology unclear, potentially related to systemic therapy or an infection, doxycycline prescribed-improved 10/23/2019; marked improvement 11/20/2019; at office visit 01/02/2020 she reports worsening of lower extremity edema, pain/tenderness, erythema 3 to 4 days following each treatment. Alimta held 01/02/2020. Referral to dermatology. 12. COVID-19 infection 03/30/2020, monoclonal antibody therapy 04/06/2020   Disposition: [name] appears unchanged.  The restaging CTs show no evidence of disease progression.  She is tolerating pembrolizumab well.  The plan is to continue pembrolizumab for a total of 2 years.  I suspect the pain is related to arthritis as opposed to bone  metastases.  We discussed proceeding with additional imaging of the bones such as a PET scan or pelvic MRI.  She does not wish to undergo further evaluation at present.  [name] will complete another  treatment with pembrolizumab today.  She will return for an office visit and pembrolizumab in 3 weeks.  [name], MD  12/17/2020  10:49 AM

## 2020-12-20 ENCOUNTER — Encounter: Payer: Self-pay | Admitting: Oncology

## 2020-12-21 ENCOUNTER — Other Ambulatory Visit: Payer: Self-pay | Admitting: Nurse Practitioner

## 2020-12-21 ENCOUNTER — Other Ambulatory Visit: Payer: Self-pay

## 2020-12-21 ENCOUNTER — Encounter: Payer: Self-pay | Admitting: Cardiovascular Disease

## 2020-12-21 ENCOUNTER — Ambulatory Visit (INDEPENDENT_AMBULATORY_CARE_PROVIDER_SITE_OTHER): Payer: Medicare Other | Admitting: Cardiovascular Disease

## 2020-12-21 VITALS — BP 170/70 | HR 53 | Ht 65.0 in | Wt 184.4 lb

## 2020-12-21 DIAGNOSIS — I358 Other nonrheumatic aortic valve disorders: Secondary | ICD-10-CM

## 2020-12-21 DIAGNOSIS — I25118 Atherosclerotic heart disease of native coronary artery with other forms of angina pectoris: Secondary | ICD-10-CM

## 2020-12-21 DIAGNOSIS — I10 Essential (primary) hypertension: Secondary | ICD-10-CM | POA: Diagnosis not present

## 2020-12-21 DIAGNOSIS — C3492 Malignant neoplasm of unspecified part of left bronchus or lung: Secondary | ICD-10-CM

## 2020-12-21 DIAGNOSIS — E785 Hyperlipidemia, unspecified: Secondary | ICD-10-CM | POA: Diagnosis not present

## 2020-12-21 DIAGNOSIS — I2510 Atherosclerotic heart disease of native coronary artery without angina pectoris: Secondary | ICD-10-CM

## 2020-12-21 MED ORDER — ISOSORBIDE MONONITRATE ER 30 MG PO TB24: 30.0000 mg | ORAL_TABLET | Freq: Every day | ORAL | 3 refills | Status: DC

## 2020-12-21 NOTE — Progress Notes (Signed)
Patient ID: [name], female   DOB: 03/17/1935, 86 y.o.   MRN: [id number]      HPI: [name] is a 86 y.o. female who presents to the office for a 6 month cardiology evaluation.  [name] has known CAD and in March 2012 underwent stenting of a 90% eccentric RCA stenosis with a 3.0x15 mm integrity bare-metal stent. She developed recurrent chest pain in July 2012 and repeat catheterization revealed widely patent stent. She did have 60% postural diagonal 1 stenosis and medical therapy was recommended. She had mild luminal irregularities of the LAD.  In 2012 an echo Doppler study did show normal systolic function with mild aortic sclerosis mild MR and mild TR. She has a history of hyperlipidemia, restless legs, as well as documented insulin resistance.  She developed a herniated disc in her neck and underwent surgery by [name] on November 30 1 2014. She tolerated surgery well from a cardiovascular standpoint.  She has mild nerve damage from an episode of shingles which is improved with Neurontin. She does have restless legs which she takes Sinemet 25/100, one quarter of a tablet 2 times per day. She does have hyperlipidemia. She does have hypertension. She denies presyncope or syncope.    When I saw her in 2016 she had noticed development of rare chest pain when she walks fast or walks up a steep incline.  She underwent a nuclear perfusion study in October 2016 which showed normal perfusion and function without scar or ischemia.    She underwent eye surgery in  August 2017 involving her lacrimal duct.  She did have significant nosebleed following this.  She underwent a follow-up echo Doppler study in December 2017 which showed normal ejection fraction at 55-60% with grade 1 diastolic dysfunction.  There was aortic valve sclerosis without stenosis.  She tells me that her metformin was discontinued this past year by [name].  Laboratory had shown a hemoglobin  A1c of 6.1.  In May 2018 LDL cholesterol was 63 on rosuvastatin.  I last saw her in February 2019.  At that time her blood pressure was stable she has continued to be on lisinopril 5 mg daily, metoprolol 37.5 mg twice a day for hypertension.  She has peripheral neuropathy on gabapentin 300 mg daily.  She has restless legs, and also takes carbidopa levodopa 25/100 mg one quarter of a tablet twice a day with improvement in symptomatology.  She denied any recurrent anginal symptoms.  There is mild shortness of breath with activity.  She is active.  I encouraged at least 150 minutes/week of exercise.  I saw her in February 2020.  At that time felt fairly well from a cardiac standpoint.  She continues to experience some mild shortness of breath with activity without significant change.  She was walking her dog at least 20 minutes a day basis.  She has been documented to have diastolic dysfunction on echocardiography.  She was under  increased stress with her husband's illness who in addition to his bronchiectasis also has developed leukemia.  I saw her in February 2021.  Since her prior evaluation with me she unfortunately  was diagnosed with non-small cell lung CA, stage IV with metastatic bone lesion.  She is followed by [name].  She completed 4 cycles of carboplatin/Alimta/pembrolizumab on August 07, 2019.  She had a remote history of smoking and had smoked 1 pack/day for 25 years and quit smoking in 1990, 31 years ago.  She  denied any chest pain, PND orthopnea.  During that evaluation, on exam she had an early peaking systolic murmur felt consistent with probable mild aortic stenosis.  She had not had an echo Doppler study since 2017 and I commended a follow-up echo Doppler assessment.  She underwent a follow-up echo Doppler study in September 26, 2019.  This showed an EF of 60 to 65%.  There was moderate asymmetric left ventricular hypertrophy of the basal septum without S.A.M. or LVOT obstruction.  There  was grade 1 diastolic dysfunction.  Estimated RV systolic pressure was 73.7 mm.  Aortic valve was tricuspid and there was no evidence for stenosis.  I last saw her in November 2021.  She continues to be followed by oncology for her metastatic lung cancer and  completed 18 cycles of pembrolizumab.  She continues to have back and hip pain.  She also developed a COVID-19 infection on March 30, 2020 and received monoclonal antibody therapy on April 06, 2020.  She denies any chest pain, PND orthopnea.  She denies any presyncope or syncope.    Since I last saw her, she tells me a recent CT had shown some mets to her bone.  She she continues to follow with [name] and had another cycle of Keytruda on November 26, 2020.  At times she admits to rare angina and has taken sublingual nitroglycerin with benefit rarely.  She denies any prolonged episodes of chest pain.  She continues to be on lisinopril 10 mg, metoprolol tartrate 25 mg twice a day and has been taking baby aspirin 3 times per week.  She is on rosuvastatin 40 mg daily for hyperlipidemia.  She presents for evaluation  Past Medical History:  Diagnosis Date  . Aneurysm ([place])    Right eye a non DES stent was placed so that she would not required long term dual antiplatelet therapy.  . Anxiety    not currently taking any meds  . Arthritis   . CAD (coronary artery disease)   . Diabetes mellitus without complication (HCC)    prediabetes per pt  . H/O hiatal hernia   . Heart murmur   . History of stress test 03/2012   The post stress myocardial perfusion images show a normal pattern of perfusion in all region. The post left ventricles is normal in size. There is no scintigraphic of inductible myocardial ischemia. The post EF is 73  . Hx of echocardiogram 11/2010   Ef 67% Normal size chambes, Aortic valve sclerosis without stenosis, No other significant valvular abnormalities, No percardial effusion.  . Hyperlipemia   . Hypertension   . Insulin  resistance   . Multiple thyroid nodules   . Neuromuscular disorder (HCC)    nerve pain after shingles  . Osteoporosis   . Restless legs   . Shingles     Past Surgical History:  Procedure Laterality Date  . ABDOMINAL HYSTERECTOMY  1970  . ANGIOPLASTY     Stenting of a 90% eccentric right coronary artery stenosis and had a 3.0x15 mm Integrity bare-metal stent inserted  . ANTERIOR CERVICAL DECOMP/DISCECTOMY FUSION N/A 07/30/2013   Procedure: ANTERIOR CERVICAL DECOMPRESSION/DISCECTOMY FUSION CERVICAL FIVE -SIX;  Surgeon: Eustace Moore, MD;  Location: [facility];  Service: Neurosurgery;  Laterality: N/A;  . APPENDECTOMY    . BREAST SURGERY Left    cysts removed from left breast (in her 20'2)  . CARDIAC CATHETERIZATION     Showed a widely patent stent, she did have 60% ostial diagonal-1  stenosis, She also had mild luminal irregularities of her LAD.  Marland Kitchen COLONOSCOPY    . EYE SURGERY Bilateral 2009   cateract surgery- bilateral  . IR IMAGING GUIDED PORT INSERTION  06/04/2019  . REFRACTIVE SURGERY Left 2011  . TONSILLECTOMY      Allergies  Allergen Reactions  . Codeine Nausea And Vomiting and Other (See Comments)    Severe constipation.    Current Outpatient Medications  Medication Sig Dispense Refill  . acetaminophen (TYLENOL) 500 MG tablet Take 625 mg by mouth every 6 (six) hours as needed (pain). Per Pt 12/17/20    . ALPRAZolam (XANAX) 0.5 MG tablet Take 1 tablet by mouth 2 (two) times daily as needed for anxiety.     . carboxymethylcellulose (REFRESH PLUS) 0.5 % SOLN Apply 1 drop to eye 3 (three) times daily as needed.    Marland Kitchen COVID-19 mRNA vaccine, Pfizer, 30 MCG/0.3ML injection Inject into the muscle. 0.3 mL 0  . ferrous sulfate 325 (65 FE) MG tablet Take 325 mg by mouth 3 (three) times a week.    . gabapentin (NEURONTIN) 300 MG capsule Take 300 mg by mouth at bedtime.     . isosorbide mononitrate (IMDUR) 30 MG 24 hr tablet Take 1 tablet (30 mg total) by mouth daily. 90 tablet 3   . lidocaine (LIDODERM) 5 % SMARTSIG:0-3 Patch(s) Topical Daily    . lidocaine-prilocaine (EMLA) cream Apply 1 application topically as needed. 30 g 2  . lisinopril (ZESTRIL) 10 MG tablet TAKE 1 TABLET BY MOUTH  DAILY 90 tablet 3  . Magnesium 250 MG TABS Take 250 mg by mouth at bedtime.     . metFORMIN (GLUCOPHAGE-XR) 500 MG 24 hr tablet Take 500 mg by mouth at bedtime.     . metoprolol tartrate (LOPRESSOR) 25 MG tablet Take 25 mg by mouth at bedtime.     . Multiple Vitamins-Minerals (CENTRUM SILVER PO) Take 1 tablet by mouth daily.    . naproxen sodium (ALEVE) 220 MG tablet Take 440 mg by mouth 2 (two) times daily as needed (pain).    . nitroGLYCERIN (NITROSTAT) 0.4 MG SL tablet Place 0.4 mg under the tongue every 5 (five) minutes as needed for chest pain.     Marland Kitchen ondansetron (ZOFRAN) 8 MG tablet Take 1 tablet (8 mg total) by mouth every 8 (eight) hours as needed for nausea or vomiting. 30 tablet 1  . polyethylene glycol (MIRALAX / GLYCOLAX) packet Take 17 g by mouth every morning.     Marland Kitchen rOPINIRole (REQUIP) 1 MG tablet Take 1 mg by mouth 2 (two) times daily. In the evening    . rosuvastatin (CRESTOR) 40 MG tablet Take 40 mg by mouth at bedtime.     . traZODone (DESYREL) 100 MG tablet Take 100 mg by mouth at bedtime.     . Vitamin D, Ergocalciferol, (DRISDOL) 50000 UNITS CAPS capsule Take 50,000 Units by mouth every 7 (seven) days.    Marland Kitchen zinc gluconate 50 MG tablet Take 50 mg by mouth daily.     No current facility-administered medications for this visit.    Social History   Socioeconomic History  . Marital status: Married    Spouse name: Not on file  . Number of children: Not on file  . Years of education: Not on file  . Highest education level: Not on file  Occupational History  . Not on file  Tobacco Use  . Smoking status: Former Smoker    Packs/day: 2.00  Years: 33.00    Pack years: 66.00    Types: Cigarettes  . Smokeless tobacco: Never Used  . Tobacco comment: quit in 1995.   Vaping Use  . Vaping Use: Never used  Substance and Sexual Activity  . Alcohol use: Yes    Alcohol/week: 0.0 standard drinks    Comment: occasional (maybe twice a year)  . Drug use: No  . Sexual activity: Not on file  Other Topics Concern  . Not on file  Social History Narrative  . Not on file   Social Determinants of Health   Financial Resource Strain: Not on file  Food Insecurity: Not on file  Transportation Needs: Not on file  Physical Activity: Not on file  Stress: Not on file  Social Connections: Not on file  Intimate Partner Violence: Not on file    Family History  Problem Relation Age of Onset  . Alzheimer's disease Mother 77  . Colon polyps Mother   . Diabetes Mother   . Heart attack Father 38  . Cancer Maternal Grandmother 16  . Breast cancer Maternal Grandmother   . Diabetes Sister   . Breast cancer Maternal Uncle   . Breast cancer Maternal Aunt        x 2  . Colon polyps Sister   . Colon polyps Maternal Aunt   . Colon cancer Neg Hx   . Esophageal cancer Neg Hx   . Rectal cancer Neg Hx   . Stomach cancer Neg Hx    Socially she is married has one child and 2 grandchildren. There is no tobacco or alcohol use.  ROS General: Negative; No fevers, chills, or night sweats;  HEENT: Negative; No changes in vision or hearing, sinus congestion, difficulty swallowing Pulmonary: Positive for non-small cell lung cancer, metastatic to bone multiple sites Cardiovascular: see HPI GI: GERD; No nausea, vomiting, diarrhea, or abdominal pain GU: Negative; No dysuria, hematuria, or difficulty voiding Musculoskeletal: Negative; no myalgias, joint pain, or weakness Hematologic/Oncology: Negative; no easy bruising, bleeding Endocrine: Negative; no heat/cold intolerance; no diabetes Neuro: Positive for restless legs Skin: Negative; No rashes or skin lesions Psychiatric: Negative; No behavioral problems, depression Sleep: Negative; No snoring, daytime sleepiness,  hypersomnolence, bruxism, restless legs, hypnogognic hallucinations, no cataplexy Other comprehensive 14 point system review is negative   PE. BP (!) 170/70 (BP Location: Left Arm, Patient Position: Sitting)   Pulse (!) 53   Ht _0  (1.651 m)   Wt 184 lb 6.4 oz (83.6 kg)   BMI 30.69 kg/m   Repeat blood pressure by me was 142/70  Wt Readings from Last 3 Encounters:  12/21/20 184 lb 6.4 oz (83.6 kg)  12/17/20 181 lb 3.2 oz (82.2 kg)  11/26/20 181 lb 6.4 oz (82.3 kg)   General: Alert, oriented, no distress.  Skin: normal turgor, no rashes, warm and dry HEENT: Normocephalic, atraumatic. Pupils equal round and reactive to light; sclera anicteric; extraocular muscles intact;  Nose without nasal septal hypertrophy Mouth/Parynx benign; Mallinpatti scale 3 Neck: No JVD, no carotid bruits; normal carotid upstroke Lungs: clear to ausculatation and percussion; no wheezing or rales Chest wall: without tenderness to palpitation Heart: PMI not displaced, RRR, s1 s2 normal, 1/6 systolic murmur, no diastolic murmur, no rubs, gallops, thrills, or heaves Abdomen: soft, nontender; no hepatosplenomehaly, BS+; abdominal aorta nontender and not dilated by palpation. Back: no CVA tenderness Pulses 2+ Musculoskeletal: full range of motion, normal strength, no joint deformities Extremities: no clubbing cyanosis or edema, Homan's sign negative  Neurologic:  grossly nonfocal; Cranial nerves grossly wnl Psychologic: Normal mood and affect   ECG (independently read by me): Sinus bradycardia at 53; Ist degree AV block  November 2021 ECG (independently read by me): Sinus bradycardia at 59; no ectopy; PR 200 msec, QTc 386 msec  September 12, 2019 ECG (independently read by me): Sinus bradycardia at 59; MIld LVH; normal intervals  January 2020 ECG (independently read by me): Sinus bradycardia 53 bpm with first-degree AV block..  Well to 10 ms.  LVH by voltage  February 2019 ECG (independently read by me):  Sinus bradycardia at 59 bpm.  First degree AV block with a PR interval of 2:30 milliseconds.  No syncope ST-T changes.  November 2017 ECG (independently read by me): Normal sinus rhythm at 62 bpm.  No ectopy.  Normal intervals.  May 2017 ECG (independently read by me): Sinus bradycardia 55 beats per minute with first-degree AV block, PR interval 238 ms.  No ST segment changes.  September 2016 ECG (independently read by me): Sinus bradycardia at 55 bpm.  Nonspecific ST changes  March 2015 ECG (independently read by me): Sinus bradycardia 55 beats per minute. Nonspecific ST changes appear.  Prior 06/03/2013 ECG: Sinus rhythm with first degree AV block. QRS complex V1 V2. Nonspecific ST changes which are present previously. Borderline LVH by voltage in aVL  LABS: BMP Latest Ref Rng & Units 12/15/2020 11/26/2020 11/03/2020  Glucose 70 - 99 mg/dL 104(H) 110(H) 109(H)  BUN 8 - 23 mg/dL _0 Creatinine 0.44 - 1.00 mg/dL 0.88 0.72 0.79  Sodium 135 - 145 mmol/L 136 137 135  Potassium 3.5 - 5.1 mmol/L 4.6 4.0 4.3  Chloride 98 - 111 mmol/L 104 104 102  CO2 22 - 32 mmol/L _1 Calcium 8.9 - 10.3 mg/dL 9.2 9.8 9.5   Hepatic Function Latest Ref Rng & Units 12/15/2020 11/26/2020 11/03/2020  Total Protein 6.5 - 8.1 g/dL 6.5 7.1 6.8  Albumin 3.5 - 5.0 g/dL 4.1 4.6 4.3  AST 15 - 41 U/L _2 ALT 0 - 44 U/L _3 Alk Phosphatase 38 - 126 U/L 62 74 65  Total Bilirubin 0.3 - 1.2 mg/dL 0.3 0.4 0.5   CBC Latest Ref Rng & Units 12/15/2020 11/26/2020 11/03/2020  WBC 4.0 - 10.5 K/uL 4.7 6.6 5.0  Hemoglobin 12.0 - 15.0 g/dL 11.8(L) 12.5 11.8(L)  Hematocrit 36.0 - 46.0 % 36.1 39.0 35.8(L)  Platelets 150 - 400 K/uL 183 155 162   Lab Results  Component Value Date   MCV 95.3 12/15/2020   MCV 95.6 11/26/2020   MCV 97.0 11/03/2020   Lab Results  Component Value Date   TSH 0.872 12/15/2020   Lab Results  Component Value Date   HGBA1C 6.8 (H) 02/02/2011   Lipid Panel     Component Value  Date/Time   CHOL 129 04/13/2015 0802   CHOL 141 06/13/2013 0838   TRIG 87 04/13/2015 0802   TRIG 129 06/13/2013 0838   HDL 57 04/13/2015 0802   HDL 53 06/13/2013 0838   CHOLHDL 2.3 04/13/2015 0802   VLDL 17 04/13/2015 0802   LDLCALC 55 04/13/2015 0802   LDLCALC 62 06/13/2013 0838     RADIOLOGY: No results found.  IMPRESSION: 1. Coronary artery disease of native artery of native heart with stable angina pectoris ([place])   2. Essential hypertension   3. Non-small cell cancer of left lung ([place]) metastatic to bone   4. Hyperlipidemia with target LDL  less than 70   5. Aortic valve sclerosis     ASSESSMENT AND PLAN: Ms. Hartung is an 86 year old female who has established CAD and underwent stenting of her RCA with a bare metal stent in March 2012 at which time she also was found to have a 60% ostial diagonal stenosis. She has a history of hyperlipidemia.  An echo Doppler study in December 2014 showed an ejection fraction at 55-60% without wall motion abnormalities. There was mild aortic stenosis. Mitral valve was structurally normal.  Subsequently she had undergone a follow-up nuclear perfusion study due to symptoms of vague chest pain as well as exertional dyspnea which revealed normal perfusion.  An echo Doppler study on July 27, 2016 again showed LVEF at 55 to 60%.  There was grade 1 diastolic dysfunction.  There was minimal PA pressure elevation at 32 mm.  There was mild aortic sclerosis without restrictive mobility.  There was no AR.  Her most recent echo Doppler study in February 2021 showed normal systolic function with EF at 60 to 65%, moderate asymmetric LVH of the basal septal segment without capital S.A.M. or LVOT obstruction.  There was grade 1 diastolic dysfunction.  She had upper normal RV systolic pressure at 30.  She was not felt to have aortic stenosis.  She unfortunately was diagnosed with metastatic non-small cell CA with multiple areas of bone metastases.  She is now on  Keytruda infusions every 3 weeks.  She developed low-grade fever sore throat and general body aches and was diagnosed with COVID-19 infection on March 30, 2020 for which she received Regeneron antibiotic infusion on September 7.  She has experienced rare episodes of anginal-like symptoms and has taken sublingual nitroglycerin with benefit admits to taking approximately 1 every 3 months.  With her blood pressure slightly increased today, I am adding isosorbide 30 mg to her medical regimen.  She will continue her current regimen of lisinopril 10 mg and metoprolol tartrate 25 mg which she now takes twice a day.  She is taking a baby aspirin 3 days/week.  She continues to be on rosuvastatin 40 mg for hyperlipidemia with target LDL less than 70.  She continues to have Keytruda infusions but will be nearing completion of therapy. I reviewed his most recent chest CT from May 2022 which showed stable lobular nodule in the left lower lobe.  There was no mediastinal lymphadenopathy.  There was coronary artery calcification and aortic sclerosis.  She had a stable sclerotic complete lesion at L2 but otherwise no evidence of malignancy abdominal pelvis.  I will see her in 6 months for reevaluation or sooner as needed.  Tina Sine, MD, [facility]  12/26/2020 2:57 PM

## 2020-12-21 NOTE — Patient Instructions (Signed)
Medication Instructions:  Start taking Isosorbide 30 mg daily.  *If you need a refill on your cardiac medications before your next appointment, please call your pharmacy*   Lab Work: None   Testing/Procedures: None   Follow-Up: At [facility], you and your health needs are our priority.  As part of our continuing mission to provide you with exceptional heart care, we have created designated Provider Care Teams.  These Care Teams include your primary Cardiologist (physician) and Advanced Practice Providers (APPs -  Physician Assistants and Nurse Practitioners) who all work together to provide you with the care you need, when you need it.  We recommend signing up for the patient portal called "MyChart".  Sign up information is provided on this After Visit Summary.  MyChart is used to connect with patients for Virtual Visits (Telemedicine).  Patients are able to view lab/test results, encounter notes, upcoming appointments, etc.  Non-urgent messages can be sent to your provider as well.   To learn more about what you can do with MyChart, go to [email or web address].    Your next appointment:   6 month(s)  The format for your next appointment:   In Person  Provider:   Shelva Majestic, MD

## 2020-12-26 ENCOUNTER — Encounter: Payer: Self-pay | Admitting: Cardiovascular Disease

## 2020-12-30 ENCOUNTER — Other Ambulatory Visit: Payer: Self-pay | Admitting: Oncology

## 2021-01-03 ENCOUNTER — Ambulatory Visit (HOSPITAL_COMMUNITY)
Admission: RE | Admit: 2021-01-03 | Discharge: 2021-01-03 | Disposition: A | Discharge status: Home / Self Care | Payer: Medicare Other | Source: Ambulatory Visit | Attending: Nurse Practitioner | Admitting: Nurse Practitioner

## 2021-01-03 ENCOUNTER — Other Ambulatory Visit: Payer: Self-pay

## 2021-01-03 DIAGNOSIS — C3492 Malignant neoplasm of unspecified part of left bronchus or lung: Secondary | ICD-10-CM

## 2021-01-03 DIAGNOSIS — Z79899 Other long term (current) drug therapy: Secondary | ICD-10-CM | POA: Diagnosis not present

## 2021-01-03 LAB — GLUCOSE, CAPILLARY: Glucose-Capillary: 112 mg/dL — ABNORMAL HIGH (ref 70–99)

## 2021-01-03 MED ORDER — FLUDEOXYGLUCOSE F - 18 (FDG) INJECTION
9.5000 | Freq: Once | INTRAVENOUS | Status: AC | PRN
  Administered 2021-01-03: 8.7 via INTRAVENOUS

## 2021-01-07 ENCOUNTER — Inpatient Hospital Stay: Payer: Medicare Other | Attending: Oncology | Admitting: Nurse Practitioner

## 2021-01-07 ENCOUNTER — Inpatient Hospital Stay: Payer: Medicare Other

## 2021-01-07 ENCOUNTER — Other Ambulatory Visit: Payer: Self-pay

## 2021-01-07 VITALS — BP 155/63 | HR 58 | Temp 97.8°F | Resp 18 | Ht 65.0 in | Wt 182.2 lb

## 2021-01-07 DIAGNOSIS — I6782 Cerebral ischemia: Secondary | ICD-10-CM | POA: Insufficient documentation

## 2021-01-07 DIAGNOSIS — C3492 Malignant neoplasm of unspecified part of left bronchus or lung: Secondary | ICD-10-CM

## 2021-01-07 DIAGNOSIS — Z8616 Personal history of COVID-19: Secondary | ICD-10-CM | POA: Diagnosis not present

## 2021-01-07 DIAGNOSIS — I2510 Atherosclerotic heart disease of native coronary artery without angina pectoris: Secondary | ICD-10-CM | POA: Insufficient documentation

## 2021-01-07 DIAGNOSIS — C3432 Malignant neoplasm of lower lobe, left bronchus or lung: Secondary | ICD-10-CM | POA: Diagnosis present

## 2021-01-07 DIAGNOSIS — E042 Nontoxic multinodular goiter: Secondary | ICD-10-CM | POA: Diagnosis not present

## 2021-01-07 DIAGNOSIS — C7951 Secondary malignant neoplasm of bone: Secondary | ICD-10-CM | POA: Insufficient documentation

## 2021-01-07 DIAGNOSIS — Z79899 Other long term (current) drug therapy: Secondary | ICD-10-CM | POA: Diagnosis not present

## 2021-01-07 DIAGNOSIS — M47812 Spondylosis without myelopathy or radiculopathy, cervical region: Secondary | ICD-10-CM | POA: Insufficient documentation

## 2021-01-07 DIAGNOSIS — Z5112 Encounter for antineoplastic immunotherapy: Secondary | ICD-10-CM | POA: Insufficient documentation

## 2021-01-07 DIAGNOSIS — G893 Neoplasm related pain (acute) (chronic): Secondary | ICD-10-CM | POA: Diagnosis not present

## 2021-01-07 DIAGNOSIS — E785 Hyperlipidemia, unspecified: Secondary | ICD-10-CM | POA: Insufficient documentation

## 2021-01-07 DIAGNOSIS — Z87440 Personal history of urinary (tract) infections: Secondary | ICD-10-CM | POA: Diagnosis not present

## 2021-01-07 DIAGNOSIS — Z803 Family history of malignant neoplasm of breast: Secondary | ICD-10-CM | POA: Insufficient documentation

## 2021-01-07 DIAGNOSIS — G8929 Other chronic pain: Secondary | ICD-10-CM | POA: Diagnosis not present

## 2021-01-07 DIAGNOSIS — I10 Essential (primary) hypertension: Secondary | ICD-10-CM | POA: Insufficient documentation

## 2021-01-07 DIAGNOSIS — M549 Dorsalgia, unspecified: Secondary | ICD-10-CM | POA: Diagnosis not present

## 2021-01-07 LAB — CBC WITH DIFFERENTIAL (CANCER CENTER ONLY)
Abs Immature Granulocytes: 0.02 10*3/uL (ref 0.00–0.07)
Basophils Absolute: 0 10*3/uL (ref 0.0–0.1)
Basophils Relative: 0 %
Eosinophils Absolute: 0 10*3/uL (ref 0.0–0.5)
Eosinophils Relative: 0 %
HCT: 36.1 % (ref 36.0–46.0)
Hemoglobin: 11.8 g/dL — ABNORMAL LOW (ref 12.0–15.0)
Immature Granulocytes: 0 %
Lymphocytes Relative: 22 %
Lymphs Abs: 1.6 10*3/uL (ref 0.7–4.0)
MCH: 30.9 pg (ref 26.0–34.0)
MCHC: 32.7 g/dL (ref 30.0–36.0)
MCV: 94.5 fL (ref 80.0–100.0)
Monocytes Absolute: 0.6 10*3/uL (ref 0.1–1.0)
Monocytes Relative: 8 %
Neutro Abs: 4.9 10*3/uL (ref 1.7–7.7)
Neutrophils Relative %: 70 %
Platelet Count: 173 10*3/uL (ref 150–400)
RBC: 3.82 MIL/uL — ABNORMAL LOW (ref 3.87–5.11)
RDW: 13.2 % (ref 11.5–15.5)
WBC Count: 7.2 10*3/uL (ref 4.0–10.5)
nRBC: 0 % (ref 0.0–0.2)

## 2021-01-07 LAB — CMP (CANCER CENTER ONLY)
ALT: 15 U/L (ref 0–44)
AST: 15 U/L (ref 15–41)
Albumin: 4.2 g/dL (ref 3.5–5.0)
Alkaline Phosphatase: 60 U/L (ref 38–126)
Anion gap: 8 (ref 5–15)
BUN: 41 mg/dL — ABNORMAL HIGH (ref 8–23)
CO2: 26 mmol/L (ref 22–32)
Calcium: 9.3 mg/dL (ref 8.9–10.3)
Chloride: 103 mmol/L (ref 98–111)
Creatinine: 1.24 mg/dL — ABNORMAL HIGH (ref 0.44–1.00)
GFR, Estimated: 43 mL/min — ABNORMAL LOW (ref >60)
Glucose, Bld: 109 mg/dL — ABNORMAL HIGH (ref 70–99)
Potassium: 4.4 mmol/L (ref 3.5–5.1)
Sodium: 137 mmol/L (ref 135–145)
Total Bilirubin: 0.4 mg/dL (ref 0.3–1.2)
Total Protein: 6.3 g/dL — ABNORMAL LOW (ref 6.5–8.1)

## 2021-01-07 MED ORDER — TRAMADOL HCL 50 MG PO TABS: 50.0000 mg | ORAL_TABLET | Freq: Three times a day (TID) | ORAL | 0 refills | Status: DC | PRN

## 2021-01-07 NOTE — Patient Instructions (Signed)
Implanted Port Home Guide An implanted port is a device that is placed under the skin. It is usually placed in the chest. The device can be used to give IV medicine, to take blood, or for dialysis. You may have an implanted port if: You need IV medicine that would be irritating to the small veins in your hands or arms. You need IV medicines, such as antibiotics, for a long period of time. You need IV nutrition for a long period of time. You need dialysis. When you have a port, your health care provider can choose to use the port instead of veins in your arms for these procedures. You may have fewer limitations when using a port than you would if you used other types of long-term IVs, and you will likely be able to return to normal activities afteryour incision heals. An implanted port has two main parts: Reservoir. The reservoir is the part where a needle is inserted to give medicines or draw blood. The reservoir is round. After it is placed, it appears as a small, raised area under your skin. Catheter. The catheter is a thin, flexible tube that connects the reservoir to a vein. Medicine that is inserted into the reservoir goes into the catheter and then into the vein. How is my port accessed? To access your port: A numbing cream may be placed on the skin over the port site. Your health care provider will put on a mask and sterile gloves. The skin over your port will be cleaned carefully with a germ-killing soap and allowed to dry. Your health care provider will gently pinch the port and insert a needle into it. Your health care provider will check for a blood return to make sure the port is in the vein and is not clogged. If your port needs to remain accessed to get medicine continuously (constant infusion), your health care provider will place a clear bandage (dressing) over the needle site. The dressing and needle will need to be changed every week, or as told by your health care provider. What  is flushing? Flushing helps keep the port from getting clogged. Follow instructions from your health care provider about how and when to flush the port. Ports are usually flushed with saline solution or a medicine called heparin. The need for flushing will depend on how the port is used: If the port is only used from time to time to give medicines or draw blood, the port may need to be flushed: Before and after medicines have been given. Before and after blood has been drawn. As part of routine maintenance. Flushing may be recommended every 4-6 weeks. If a constant infusion is running, the port may not need to be flushed. Throw away any syringes in a disposal container that is meant for sharp items (sharps container). You can buy a sharps container from a pharmacy, or you can make one by using an empty hard plastic bottle with a cover. How long will my port stay implanted? The port can stay in for as long as your health care provider thinks it is needed. When it is time for the port to come out, a surgery will be done to remove it. The surgery will be similar to the procedure that was done to putthe port in. Follow these instructions at home:  Flush your port as told by your health care provider. If you need an infusion over several days, follow instructions from your health care provider about how to take   care of your port site. Make sure you: Wash your hands with soap and water before you change your dressing. If soap and water are not available, use alcohol-based hand sanitizer. Change your dressing as told by your health care provider. Place any used dressings or infusion bags into a plastic bag. Throw that bag in the trash. Keep the dressing that covers the needle clean and dry. Do not get it wet. Do not use scissors or sharp objects near the tube. Keep the tube clamped, unless it is being used. Check your port site every day for signs of infection. Check for: Redness, swelling, or  pain. Fluid or blood. Pus or a bad smell. Protect the skin around the port site. Avoid wearing bra straps that rub or irritate the site. Protect the skin around your port from seat belts. Place a soft pad over your chest if needed. Bathe or shower as told by your health care provider. The site may get wet as long as you are not actively receiving an infusion. Return to your normal activities as told by your health care provider. Ask your health care provider what activities are safe for you. Carry a medical alert card or wear a medical alert bracelet at all times. This will let health care providers know that you have an implanted port in case of an emergency. Get help right away if: You have redness, swelling, or pain at the port site. You have fluid or blood coming from your port site. You have pus or a bad smell coming from the port site. You have a fever. Summary Implanted ports are usually placed in the chest for long-term IV access. Follow instructions from your health care provider about flushing the port and changing bandages (dressings). Take care of the area around your port by avoiding clothing that puts pressure on the area, and by watching for signs of infection. Protect the skin around your port from seat belts. Place a soft pad over your chest if needed. Get help right away if you have a fever or you have redness, swelling, pain, drainage, or a bad smell at the port site. This information is not intended to replace advice given to you by your health care provider. Make sure you discuss any questions you have with your healthcare provider. Document Revised: 12/01/2019 Document Reviewed: 12/01/2019 Elsevier Patient Education  2022 [employer]  

## 2021-01-07 NOTE — Progress Notes (Signed)
[place] [facility] OFFICE PROGRESS NOTE   Diagnosis: Non-small cell lung cancer  INTERVAL HISTORY:   [name] returns as scheduled.  She completed another cycle of Pembrolizumab 12/17/2020.  She reports chronic intermittent left neck, shoulder, arm pain for the past year.  Last night she began experiencing similar pain.  The pain is still present this morning.  The pain is more severe at this time and has not lasted this long in the past.  She has tried Tylenol, Aleve, heating pad, lidocaine patch.  She denies weakness.  No numbness or tingling.  The pain extends to the left forearm.  She describes as "achy".  She reports remote history of a ruptured disc in the cervical spine followed by surgery.  She denies leg weakness.  No unusual headaches or vision change.  No nausea or vomiting.  No diarrhea.  Objective:  Vital signs in last 24 hours:  Blood pressure (!) 155/63, pulse (!) 58, temperature 97.8 F (36.6 C), temperature source Oral, resp. rate 18, height '5\' 5"'  (1.651 m), weight 182 lb 3.2 oz (82.6 kg), SpO2 97 %.    HEENT: No thrush or ulcers.  Pupils equal round and reactive to light.  Extraocular movements intact. Resp: Lungs clear bilaterally. Cardio: Regular rate and rhythm. GI: Abdomen soft and nontender.  No hepatomegaly. Vascular: No leg edema. Neuro: Alert and oriented.  Follows commands.  Neck with good range of motion.  She may have very mild weakness in the left arm.  Grip strength is good.  Motor strength otherwise is 5/5.  She is able to move the left arm without difficulty. Port-A-Cath without erythema.  Lab Results:  Lab Results  Component Value Date   WBC 7.2 01/07/2021   HGB 11.8 (L) 01/07/2021   HCT 36.1 01/07/2021   MCV 94.5 01/07/2021   PLT 173 01/07/2021   NEUTROABS 4.9 01/07/2021    Imaging:  No results found.  Medications: I have reviewed the patient's current medications.  Assessment/Plan: Non-small cell lung cancer MRI lumbar  spine 04/29/2019- enlarging marrow lesions involving the L1 vertebral body, upper left sacrum and right iliac bone MRI pelvis 04/29/2019- 3.5 cm left iliac bone lesion appears slightly larger; other similar appearing lesions present within the left superior pubic ramus, left superior abdomen acetabulum and upper left sacrum Kappa free light chains with mild elevation 05/12/2019  CTs 05/12/2019- left lower lobe pulmonary mass 3.3 x 3.2 cm; lytic process left iliac bone; spinal lesions; 1.1 cm low-density left kidney lesion; right thyroid enlargement with heterogeneous appearance with potential for multiple discrete lesions Biopsy left lower lobe lung mass 05/26/2019-poorly differentiated carcinoma; positive for cytokeratin 5/6, p63 and TTF-1, no EGFR, BRAF, ALK, ERBB2,ROS, or NTRK alteration Cycle 1 carboplatin/Alimta/pembrolizumab 06/06/2019 Cycle 2 carboplatin/Alimta/pembrolizumab 06/27/2019 Cycle 3 carboplatin/Alimta/pembrolizumab 07/18/2019 Cycle 4 carboplatin/Alimta/pembrolizumab 08/07/2019 CTs 08/27/2019-significant decrease in size of lobulated mass left lower lobe.  Unchanged appearance of subtle bone lesions. Cycle 5 Alimta/pembrolizumab 08/28/2019 Cycle 6 Alimta/pembrolizumab 09/18/2019 Cycle 7 Alimta/pembrolizumab 10/09/2019 Cycle 8 Alimta/pembrolizumab 10/30/2019 Cycle 9 Alimta/pembrolizumab 11/20/2019 CTs 12/09/2019-no evidence of disease progression, left lower lobe nodule slightly decreased in size, stable L1, left sacral, and left pubic ramus metastases Cycle 10 Alimta/pembrolizumab 12/11/2019 Cycle 11 pembrolizumab alone 01/02/2020 (Alimta held due to edema, tenderness, erythema at the lower legs) Cycle 12 pembrolizumab 01/23/2020 Cycle 13 pembrolizumab 02/12/2020 Cycle 14 pembrolizumab 03/04/2020 CTs 03/18/2020-stable left lower lobe lesion, mild sclerosis at the superior endplate of L1 that was previously hypermetabolic, stable small left upper sacral lucent lesion,  previous left superior pubic  ramus lesion is occult on the CT, CT head negative for malignancy Cycle 15 pembrolizumab 03/25/2020 Cycle 16 pembrolizumab 04/21/2020 Cycle 17 pembrolizumab 05/17/2020 05/19/2020 bone scan-no definite abnormalities to suggest osseous metastases.  Areas of concern on prior PET-CT involving left iliac bone and L1 vertebral body showed no abnormalities on the current study Cycle 18 Pembrolizumab 06/10/2020 Cycle 19 Pembrolizumab 06/30/2020 CTs 07/16/2020-stable left lower lobe nodule, stable faint superior L1 vertebral lesion, no evidence of disease progression Cycle 20 pembrolizumab 07/21/2020 Cycle 21 Pembrolizumab 08/11/2020 Cycle 22 pembrolizumab 09/01/2020 Cycle 23 Pembrolizumab 09/22/2020 Cycle 24 pembrolizumab 10/13/2020 Cycle 25 Pembrolizumab 11/03/2020 Cycle 26 pembrolizumab 11/26/2020 CTs 12/15/2020- stable left lower lobe mass, stable sclerotic lesion at L2, no evidence of disease progression Cycle 27 pembrolizumab 12/17/2020 PET scan 01/04/2019 22-1.8 x 1.3 cm left lower lobe nodule similar in size to CT of 12/15/2020 and measures smaller than previous PET/CT from 2020.  Nodule is markedly hypermetabolic.  No evidence for hypermetabolic hilar or mediastinal lymphadenopathy.  Several tiny foci of hypermetabolism identified in bony anatomy raising concern for skeletal metastases.  Comparison of the PET to CT 07/16/2020-lobular left lower lobe pulmonary nodule measured 2.4 x 1.5 cm on the prior study, current study it measured 1.8 x 1.3 cm.  Lesion in the anterior left acetabulum is similar to the 07/16/2020 exam although overlying cortical thinning slightly more pronounced on the current study.  Described lesion in the scapula shows some cortical sclerosis and a tiny central marrow lucency not substantially changed compared to 07/16/2020.  Left third rib lesion shows heterogeneous mineralization similar to 07/16/2020. Pain secondary to #1, improved Chronic back pain Type 2 diabetes Essential  hypertension CAD Hyperlipidemia Family history significant for multiple members with breast cancer Grade 1 skin rash 07/18/2019 likely related to immunotherapy.  Topical steroid cream as needed. E. coli urinary tract infection 07/14/2019.  Completed cephalexin. Edema/tenderness at the right greater than left ankle 10/21/2019-etiology unclear, potentially related to systemic therapy or an infection, doxycycline prescribed-improved 10/23/2019; marked improvement 11/20/2019; at office visit 01/02/2020 she reports worsening of lower extremity edema, pain/tenderness, erythema 3 to 4 days following each treatment.  Alimta held 01/02/2020.  Referral to dermatology. COVID-19 infection 03/30/2020, monoclonal antibody therapy 04/06/2020    Disposition: [name] appears unchanged.  She has completed 27 cycles of Pembrolizumab.  Recent PET scan peer to CT from December 2021 indicates stable disease in the bones, lung mass measures smaller.  Initial plan for Dr. Benay Spice was to continue with Pembrolizumab.  However, at today's visit she reports onset of pain radiating from the left neck to the left forearm since last night.  She has had similar symptoms in the past but the pain is significantly worse.  I discussed her symptoms with Dr. Burr Medico.  We decided to hold today's treatment and refer her for MRI of the cervical spine.  We discussed reasons to seek emergency evaluation including worsening pain, weakness, numbness.  She and her husband understand.  We discussed steroids which she declines due to being on Pembrolizumab.  Prescription sent to her pharmacy for tramadol.  Further follow-up will be scheduled pending MRI results.    Ned Card ANP/GNP-BC   01/07/2021  10:55 AM

## 2021-01-10 ENCOUNTER — Telehealth: Payer: Self-pay | Admitting: Nurse Practitioner

## 2021-01-10 ENCOUNTER — Encounter: Payer: Self-pay | Admitting: Nurse Practitioner

## 2021-01-10 NOTE — Telephone Encounter (Signed)
NO los 6/10

## 2021-01-12 ENCOUNTER — Telehealth: Payer: Self-pay | Admitting: Oncology

## 2021-01-12 NOTE — Telephone Encounter (Signed)
Called pt per 6/14 sch msg - left message for patient with apt date and time

## 2021-01-13 ENCOUNTER — Ambulatory Visit (HOSPITAL_COMMUNITY)
Admission: RE | Admit: 2021-01-13 | Discharge: 2021-01-13 | Disposition: A | Discharge status: Home / Self Care | Payer: Medicare Other | Source: Ambulatory Visit | Attending: Nurse Practitioner | Admitting: Nurse Practitioner

## 2021-01-13 ENCOUNTER — Other Ambulatory Visit: Payer: Self-pay

## 2021-01-13 DIAGNOSIS — C3492 Malignant neoplasm of unspecified part of left bronchus or lung: Secondary | ICD-10-CM | POA: Insufficient documentation

## 2021-01-13 MED ORDER — GADOBUTROL 1 MMOL/ML IV SOLN
8.0000 mL | Freq: Once | INTRAVENOUS | Status: AC | PRN
  Administered 2021-01-13: 8 mL via INTRAVENOUS

## 2021-01-14 ENCOUNTER — Inpatient Hospital Stay (HOSPITAL_BASED_OUTPATIENT_CLINIC_OR_DEPARTMENT_OTHER): Payer: Medicare Other | Admitting: Oncology

## 2021-01-14 ENCOUNTER — Inpatient Hospital Stay: Payer: Medicare Other

## 2021-01-14 VITALS — BP 129/56 | HR 58 | Temp 97.8°F | Resp 18 | Ht 65.0 in | Wt 182.4 lb

## 2021-01-14 DIAGNOSIS — C3492 Malignant neoplasm of unspecified part of left bronchus or lung: Secondary | ICD-10-CM

## 2021-01-14 DIAGNOSIS — Z5112 Encounter for antineoplastic immunotherapy: Secondary | ICD-10-CM | POA: Diagnosis not present

## 2021-01-14 MED ORDER — SODIUM CHLORIDE 0.9% FLUSH
10.0000 mL | INTRAVENOUS | Status: DC | PRN
  Filled 2021-01-14: qty 10

## 2021-01-14 MED ORDER — SODIUM CHLORIDE 0.9 % IV SOLN
200.0000 mg | Freq: Once | INTRAVENOUS | Status: AC
  Administered 2021-01-14: 200 mg via INTRAVENOUS
  Filled 2021-01-14: qty 8

## 2021-01-14 MED ORDER — SODIUM CHLORIDE 0.9 % IV SOLN
Freq: Once | INTRAVENOUS | Status: AC
  Filled 2021-01-14: qty 250

## 2021-01-14 MED ORDER — HEPARIN SOD (PORK) LOCK FLUSH 100 UNIT/ML IV SOLN
500.0000 [IU] | Freq: Once | INTRAVENOUS | Status: DC | PRN
  Filled 2021-01-14: qty 5

## 2021-01-14 NOTE — Progress Notes (Signed)
[name] OFFICE PROGRESS NOTE   Diagnosis: Non-small cell lung cancer  INTERVAL HISTORY:   [name] completed another treatment with pembrolizumab on 12/17/2020.  No rash or diarrhea.  She continues to have discomfort throughout the back, neck, and shoulders.  She uses Tylenol and Aleve as needed.  Objective:  Vital signs in last 24 hours:  Blood pressure (!) 129/56, pulse (!) 58, temperature 97.8 F (36.6 C), temperature source Oral, resp. rate 18, height _0  (1.651 m), weight 182 lb 6.4 oz (82.7 kg), SpO2 96 %.   Resp: Lungs clear bilaterally Cardio: Regular rate and rhythm GI: No hepatosplenomegaly Vascular: No leg edema Musculoskeletal: Tender throughout the spine and neck, no mass  Portacath/PICC-without erythema  Lab Results:  Lab Results  Component Value Date   WBC 7.2 01/07/2021   HGB 11.8 (L) 01/07/2021   HCT 36.1 01/07/2021   MCV 94.5 01/07/2021   PLT 173 01/07/2021   NEUTROABS 4.9 01/07/2021    CMP  Lab Results  Component Value Date   NA 137 01/07/2021   K 4.4 01/07/2021   CL 103 01/07/2021   CO2 26 01/07/2021   GLUCOSE 109 (H) 01/07/2021   BUN 41 (H) 01/07/2021   CREATININE 1.24 (H) 01/07/2021   CALCIUM 9.3 01/07/2021   PROT 6.3 (L) 01/07/2021   ALBUMIN 4.2 01/07/2021   AST 15 01/07/2021   ALT 15 01/07/2021   ALKPHOS 60 01/07/2021   BILITOT 0.4 01/07/2021   GFRNONAA 43 (L) 01/07/2021   GFRAA >60 04/21/2020    No results found for: CEA1  Lab Results  Component Value Date   INR 1.0 06/04/2019    Imaging:  MR CERVICAL SPINE W WO CONTRAST  Result Date: 01/13/2021 CLINICAL DATA:  Neck and left arm pain for 6 months. History of metastatic lung cancer and cervical fusion. EXAM: MRI CERVICAL SPINE WITHOUT AND WITH CONTRAST TECHNIQUE: Multiplanar and multiecho pulse sequences of the cervical spine, to include the craniocervical junction and cervicothoracic junction, were obtained without and with intravenous contrast.  CONTRAST:  3m GADAVIST GADOBUTROL 1 MMOL/ML IV SOLN COMPARISON:  10/04/2017 FINDINGS: Alignment: Normal. Vertebrae: No fracture or suspicious marrow lesion. Prior C5-6 ACDF with evidence of solid interbody arthrodesis. Increased left facet edema at C5-6, possibly with developing early ankylosis across the facet joint. Cord: Normal cord signal and morphology. No abnormal intradural enhancement. Posterior Fossa, vertebral arteries, paraspinal tissues: Preserved vertebral artery flow voids. Patchy T2 hyperintensity in the pons, similar to the prior study and nonspecific but most often seen with chronic small vessel ischemia. Partial imaging of bilateral thyroid nodules measuring up to approximately 1.2 cm in size. In the setting of significant comorbidities or limited life expectancy, no follow-up recommended (ref: J Am Coll Radiol. 2015 Feb;12(2): 143-50). Disc levels: C2-3: Negative. C3-4: Minimal disc bulging, uncovertebral spurring, and progressive moderate to severe right facet arthrosis without significant stenosis. C4-5: Minimal endplate spurring without stenosis. C5-6: ACDF.  Asymmetric left facet arthropathy.  No stenosis. C6-7: Minimal disc bulging and left facet arthrosis without stenosis, unchanged. C7-T1: Minimal disc bulging and progressive severe left facet arthrosis without significant stenosis. IMPRESSION: 1. Prior C5-6 ACDF without residual stenosis. Increased left facet edema at this level which may be a source of neck pain. 2. Progressive severe facet arthrosis on the left at C7-T1 and on the right at C3-4 without significant stenosis. 3. No evidence of metastatic disease in the cervical spine. Electronically Signed   By: ALogan BoresM.D.   On:  01/13/2021 09:35    Medications: I have reviewed the patient's current medications.   Assessment/Plan: Non-small cell lung cancer MRI lumbar spine 04/29/2019- enlarging marrow lesions involving the L1 vertebral body, upper left sacrum and right iliac  bone MRI pelvis 04/29/2019- 3.5 cm left iliac bone lesion appears slightly larger; other similar appearing lesions present within the left superior pubic ramus, left superior abdomen acetabulum and upper left sacrum Kappa free light chains with mild elevation 05/12/2019  CTs 05/12/2019- left lower lobe pulmonary mass 3.3 x 3.2 cm; lytic process left iliac bone; spinal lesions; 1.1 cm low-density left kidney lesion; right thyroid enlargement with heterogeneous appearance with potential for multiple discrete lesions Biopsy left lower lobe lung mass 05/26/2019-poorly differentiated carcinoma; positive for cytokeratin 5/6, p63 and TTF-1, no EGFR, BRAF, ALK, ERBB2,ROS, or NTRK alteration Cycle 1 carboplatin/Alimta/pembrolizumab 06/06/2019 Cycle 2 carboplatin/Alimta/pembrolizumab 06/27/2019 Cycle 3 carboplatin/Alimta/pembrolizumab 07/18/2019 Cycle 4 carboplatin/Alimta/pembrolizumab 08/07/2019 CTs 08/27/2019-significant decrease in size of lobulated mass left lower lobe.  Unchanged appearance of subtle bone lesions. Cycle 5 Alimta/pembrolizumab 08/28/2019 Cycle 6 Alimta/pembrolizumab 09/18/2019 Cycle 7 Alimta/pembrolizumab 10/09/2019 Cycle 8 Alimta/pembrolizumab 10/30/2019 Cycle 9 Alimta/pembrolizumab 11/20/2019 CTs 12/09/2019-no evidence of disease progression, left lower lobe nodule slightly decreased in size, stable L1, left sacral, and left pubic ramus metastases Cycle 10 Alimta/pembrolizumab 12/11/2019 Cycle 11 pembrolizumab alone 01/02/2020 (Alimta held due to edema, tenderness, erythema at the lower legs) Cycle 12 pembrolizumab 01/23/2020 Cycle 13 pembrolizumab 02/12/2020 Cycle 14 pembrolizumab 03/04/2020 CTs 03/18/2020-stable left lower lobe lesion, mild sclerosis at the superior endplate of L1 that was previously hypermetabolic, stable small left upper sacral lucent lesion, previous left superior pubic ramus lesion is occult on the CT, CT head negative for malignancy Cycle 15 pembrolizumab 03/25/2020 Cycle 16  pembrolizumab 04/21/2020 Cycle 17 pembrolizumab 05/17/2020 05/19/2020 bone scan-no definite abnormalities to suggest osseous metastases.  Areas of concern on prior PET-CT involving left iliac bone and L1 vertebral body showed no abnormalities on the current study Cycle 18 Pembrolizumab 06/10/2020 Cycle 19 Pembrolizumab 06/30/2020 CTs 07/16/2020-stable left lower lobe nodule, stable faint superior L1 vertebral lesion, no evidence of disease progression Cycle 20 pembrolizumab 07/21/2020 Cycle 21 Pembrolizumab 08/11/2020 Cycle 22 pembrolizumab 09/01/2020 Cycle 23 Pembrolizumab 09/22/2020 Cycle 24 pembrolizumab 10/13/2020 Cycle 25 Pembrolizumab 11/03/2020 Cycle 26 pembrolizumab 11/26/2020 CTs 12/15/2020- stable left lower lobe mass, stable sclerotic lesion at L2, no evidence of disease progression Cycle 27 pembrolizumab 12/17/2020 PET scan 01/04/2019 22-1.8 x 1.3 cm left lower lobe nodule similar in size to CT of 12/15/2020 and measures smaller than previous PET/CT from 2020.  Nodule is markedly hypermetabolic.  No evidence for hypermetabolic hilar or mediastinal lymphadenopathy.  Several tiny foci of hypermetabolism identified in bony anatomy raising concern for skeletal metastases.  Comparison of the PET to CT 07/16/2020-lobular left lower lobe pulmonary nodule measured 2.4 x 1.5 cm on the prior study, current study it measured 1.8 x 1.3 cm.  Lesion in the anterior left acetabulum is similar to the 07/16/2020 exam although overlying cortical thinning slightly more pronounced on the current study.  Described lesion in the scapula shows some cortical sclerosis and a tiny central marrow lucency not substantially changed compared to 07/16/2020.  Left third rib lesion shows heterogeneous mineralization similar to 07/16/2020. MRI of cervical spine 01/13/2021-increased left facet edema at C5-C6, severe facet arthrosis on the left at C7-T1 and on the right at C3-C4, no evidence of metastatic disease Pain secondary to #1,  improved Chronic back pain Type 2 diabetes Essential hypertension CAD Hyperlipidemia Family history significant for multiple  members with breast cancer Grade 1 skin rash 07/18/2019 likely related to immunotherapy.  Topical steroid cream as needed. E. coli urinary tract infection 07/14/2019.  Completed cephalexin. Edema/tenderness at the right greater than left ankle 10/21/2019-etiology unclear, potentially related to systemic therapy or an infection, doxycycline prescribed-improved 10/23/2019; marked improvement 11/20/2019; at office visit 01/02/2020 she reports worsening of lower extremity edema, pain/tenderness, erythema 3 to 4 days following each treatment.  Alimta held 01/02/2020.  Referral to dermatology. COVID-19 infection 03/30/2020, monoclonal antibody therapy 04/06/2020      Disposition: [name] has a history metastatic non-small cell lung cancer.  Her overall status appears unchanged.  Restaging imaging studies including a PET and MRI of the spine have not revealed clear evidence of disease progression.  The plan is to continue pembrolizumab.  [name] will complete another cycle today.  She will return for an office visit and pembrolizumab in 3 weeks.  I suspect her discomfort is related to degenerative changes in the spine.  [name], MD  01/14/2021  12:14 PM

## 2021-01-14 NOTE — Patient Instructions (Signed)
[facility]    Discharge Instructions:  Thank you for choosing [facility] to provide your oncology and hematology care.   If you have a lab appointment with the [facility], please go directly to the [facility] and check in at the registration area.   Wear comfortable clothing and clothing appropriate for easy access to any Portacath or PICC line.   We strive to give you quality time with your provider. You may need to reschedule your appointment if you arrive late (15 or more minutes).  Arriving late affects you and other patients whose appointments are after yours.  Also, if you miss three or more appointments without notifying the office, you may be dismissed from the clinic at the provider's discretion.      For prescription refill requests, have your pharmacy contact our office and allow 72 hours for refills to be completed.    Today you received the following chemotherapy and/or immunotherapy agents Pembrolizumab (KEYTRUDA).   To help prevent nausea and vomiting after your treatment, we encourage you to take your nausea medication as directed.  BELOW ARE SYMPTOMS THAT SHOULD BE REPORTED IMMEDIATELY: *FEVER GREATER THAN 100.4 F (38 C) OR HIGHER *CHILLS OR SWEATING *NAUSEA AND VOMITING THAT IS NOT CONTROLLED WITH YOUR NAUSEA MEDICATION *UNUSUAL SHORTNESS OF BREATH *UNUSUAL BRUISING OR BLEEDING *URINARY PROBLEMS (pain or burning when urinating, or frequent urination) *BOWEL PROBLEMS (unusual diarrhea, constipation, pain near the anus) TENDERNESS IN MOUTH AND THROAT WITH OR WITHOUT PRESENCE OF ULCERS (sore throat, sores in mouth, or a toothache) UNUSUAL RASH, SWELLING OR PAIN  UNUSUAL VAGINAL DISCHARGE OR ITCHING   Items with * indicate a potential emergency and should be followed up as soon as possible or go to the Emergency Department if any problems should occur.  Please show the CHEMOTHERAPY ALERT CARD or IMMUNOTHERAPY ALERT CARD  at check-in to the Emergency Department and triage nurse.  Should you have questions after your visit or need to cancel or reschedule your appointment, please contact [facility]  Dept: [phone]  and follow the prompts.  Office hours are 8:00 a.m. to 4:30 p.m. Monday - Friday. Please note that voicemails left after 4:00 p.m. may not be returned until the following business day.  We are closed weekends and major holidays. You have access to a nurse at all times for urgent questions. Please call the main number to the clinic Dept: [phone] and follow the prompts.   For any non-urgent questions, you may also contact your provider using MyChart. We now offer e-Visits for anyone 63 and older to request care online for non-urgent symptoms. For details visit [email or web address].   Also download the MyChart app! Go to the app store, search "MyChart", open the app, select [facility], and log in with your MyChart username and password.  Due to Covid, a mask is required upon entering the hospital/clinic. If you do not have a mask, one will be given to you upon arrival. For doctor visits, patients may have 1 support person aged 32 or older with them. For treatment visits, patients cannot have anyone with them due to current Covid guidelines and our immunocompromised population.   Pembrolizumab injection What is this medicine? PEMBROLIZUMAB (pem broe liz ue mab) is a monoclonal antibody. It is used to treat certain types of cancer. This medicine may be used for other purposes; ask your health care provider or pharmacist if you have questions. COMMON BRAND NAME(S): [employer]  What should I tell my health care provider before I take this medicine? They need to know if you have any of these conditions: autoimmune diseases like Crohn's disease, ulcerative colitis, or lupus have had or planning to have an allogeneic stem cell transplant (uses someone else's stem cells) history  of organ transplant history of chest radiation nervous system problems like myasthenia gravis or Guillain-Barre syndrome an unusual or allergic reaction to pembrolizumab, other medicines, foods, dyes, or preservatives pregnant or trying to get pregnant breast-feeding How should I use this medicine? This medicine is for infusion into a vein. It is given by a health care professional in a hospital or clinic setting. A special MedGuide will be given to you before each treatment. Be sure to read this information carefully each time. Talk to your pediatrician regarding the use of this medicine in children. While this drug may be prescribed for children as young as 6 months for selected conditions, precautions do apply. Overdosage: If you think you have taken too much of this medicine contact a poison control center or emergency room at once. NOTE: This medicine is only for you. Do not share this medicine with others. What if I miss a dose? It is important not to miss your dose. Call your doctor or health care professional if you are unable to keep an appointment. What may interact with this medicine? Interactions have not been studied. This list may not describe all possible interactions. Give your health care provider a list of all the medicines, herbs, non-prescription drugs, or dietary supplements you use. Also tell them if you smoke, drink alcohol, or use illegal drugs. Some items may interact with your medicine. What should I watch for while using this medicine? Your condition will be monitored carefully while you are receiving this medicine. You may need blood work done while you are taking this medicine. Do not become pregnant while taking this medicine or for 4 months after stopping it. Women should inform their doctor if they wish to become pregnant or think they might be pregnant. There is a potential for serious side effects to an unborn child. Talk to your health care professional or  pharmacist for more information. Do not breast-feed an infant while taking this medicine or for 4 months after the last dose. What side effects may I notice from receiving this medicine? Side effects that you should report to your doctor or health care professional as soon as possible: allergic reactions like skin rash, itching or hives, swelling of the face, lips, or tongue bloody or black, tarry breathing problems changes in vision chest pain chills confusion constipation cough diarrhea dizziness or feeling faint or lightheaded fast or irregular heartbeat fever flushing joint pain low blood counts - this medicine may decrease the number of white blood cells, red blood cells and platelets. You may be at increased risk for infections and bleeding. muscle pain muscle weakness pain, tingling, numbness in the hands or feet persistent headache redness, blistering, peeling or loosening of the skin, including inside the mouth signs and symptoms of high blood sugar such as dizziness; dry mouth; dry skin; fruity breath; nausea; stomach pain; increased hunger or thirst; increased urination signs and symptoms of kidney injury like trouble passing urine or change in the amount of urine signs and symptoms of liver injury like dark urine, light-colored stools, loss of appetite, nausea, right upper belly pain, yellowing of the eyes or skin sweating swollen lymph nodes weight loss Side effects that usually do not  require medical attention (report to your doctor or health care professional if they continue or are bothersome): decreased appetite hair loss tiredness This list may not describe all possible side effects. Call your doctor for medical advice about side effects. You may report side effects to FDA at 1-800-FDA-1088. Where should I keep my medicine? This drug is given in a hospital or clinic and will not be stored at home. NOTE: This sheet is a summary. It may not cover all possible  information. If you have questions about this medicine, talk to your doctor, pharmacist, or health care provider.  2021 Elsevier/Gold Standard (2019-06-18 21:44:53)

## 2021-01-14 NOTE — Progress Notes (Signed)
Per Dr. Benay Spice, ok to treat with labs from 01/07/21.

## 2021-01-17 ENCOUNTER — Ambulatory Visit (HOSPITAL_COMMUNITY): Payer: Medicare Other

## 2021-01-24 ENCOUNTER — Encounter: Payer: Self-pay | Admitting: Oncology

## 2021-01-27 ENCOUNTER — Encounter: Payer: Self-pay | Admitting: Oncology

## 2021-01-28 ENCOUNTER — Inpatient Hospital Stay: Payer: Medicare Other

## 2021-01-28 ENCOUNTER — Inpatient Hospital Stay: Payer: Medicare Other | Admitting: Oncology

## 2021-01-31 ENCOUNTER — Other Ambulatory Visit: Payer: Self-pay | Admitting: Oncology

## 2021-02-03 ENCOUNTER — Other Ambulatory Visit: Payer: Self-pay

## 2021-02-03 ENCOUNTER — Encounter: Payer: Self-pay | Admitting: *Deleted

## 2021-02-03 ENCOUNTER — Inpatient Hospital Stay: Payer: Medicare Other

## 2021-02-03 ENCOUNTER — Inpatient Hospital Stay (HOSPITAL_BASED_OUTPATIENT_CLINIC_OR_DEPARTMENT_OTHER): Payer: Medicare Other | Admitting: Nurse Practitioner

## 2021-02-03 ENCOUNTER — Inpatient Hospital Stay: Payer: Medicare Other | Attending: Oncology

## 2021-02-03 ENCOUNTER — Encounter: Payer: Self-pay | Admitting: Nurse Practitioner

## 2021-02-03 VITALS — BP 152/62 | HR 60 | Temp 97.8°F | Resp 20 | Ht 65.0 in | Wt 182.2 lb

## 2021-02-03 DIAGNOSIS — E785 Hyperlipidemia, unspecified: Secondary | ICD-10-CM | POA: Insufficient documentation

## 2021-02-03 DIAGNOSIS — G8929 Other chronic pain: Secondary | ICD-10-CM | POA: Insufficient documentation

## 2021-02-03 DIAGNOSIS — M47812 Spondylosis without myelopathy or radiculopathy, cervical region: Secondary | ICD-10-CM | POA: Insufficient documentation

## 2021-02-03 DIAGNOSIS — M549 Dorsalgia, unspecified: Secondary | ICD-10-CM | POA: Diagnosis not present

## 2021-02-03 DIAGNOSIS — Z87440 Personal history of urinary (tract) infections: Secondary | ICD-10-CM | POA: Diagnosis not present

## 2021-02-03 DIAGNOSIS — G893 Neoplasm related pain (acute) (chronic): Secondary | ICD-10-CM | POA: Diagnosis not present

## 2021-02-03 DIAGNOSIS — C7951 Secondary malignant neoplasm of bone: Secondary | ICD-10-CM | POA: Diagnosis present

## 2021-02-03 DIAGNOSIS — M25552 Pain in left hip: Secondary | ICD-10-CM | POA: Insufficient documentation

## 2021-02-03 DIAGNOSIS — Z5112 Encounter for antineoplastic immunotherapy: Secondary | ICD-10-CM | POA: Diagnosis present

## 2021-02-03 DIAGNOSIS — E119 Type 2 diabetes mellitus without complications: Secondary | ICD-10-CM | POA: Insufficient documentation

## 2021-02-03 DIAGNOSIS — Z8616 Personal history of COVID-19: Secondary | ICD-10-CM | POA: Insufficient documentation

## 2021-02-03 DIAGNOSIS — Z79899 Other long term (current) drug therapy: Secondary | ICD-10-CM | POA: Diagnosis not present

## 2021-02-03 DIAGNOSIS — I10 Essential (primary) hypertension: Secondary | ICD-10-CM | POA: Insufficient documentation

## 2021-02-03 DIAGNOSIS — I2510 Atherosclerotic heart disease of native coronary artery without angina pectoris: Secondary | ICD-10-CM | POA: Diagnosis not present

## 2021-02-03 DIAGNOSIS — C3492 Malignant neoplasm of unspecified part of left bronchus or lung: Secondary | ICD-10-CM | POA: Diagnosis not present

## 2021-02-03 DIAGNOSIS — Z803 Family history of malignant neoplasm of breast: Secondary | ICD-10-CM | POA: Insufficient documentation

## 2021-02-03 DIAGNOSIS — C3432 Malignant neoplasm of lower lobe, left bronchus or lung: Secondary | ICD-10-CM | POA: Insufficient documentation

## 2021-02-03 LAB — CMP (CANCER CENTER ONLY)
ALT: 19 U/L (ref 0–44)
AST: 19 U/L (ref 15–41)
Albumin: 4.2 g/dL (ref 3.5–5.0)
Alkaline Phosphatase: 52 U/L (ref 38–126)
Anion gap: 7 (ref 5–15)
BUN: 26 mg/dL — ABNORMAL HIGH (ref 8–23)
CO2: 25 mmol/L (ref 22–32)
Calcium: 9.6 mg/dL (ref 8.9–10.3)
Chloride: 103 mmol/L (ref 98–111)
Creatinine: 0.86 mg/dL (ref 0.44–1.00)
GFR, Estimated: >60 mL/min (ref >60)
Glucose, Bld: 110 mg/dL — ABNORMAL HIGH (ref 70–99)
Potassium: 4.3 mmol/L (ref 3.5–5.1)
Sodium: 135 mmol/L (ref 135–145)
Total Bilirubin: 0.4 mg/dL (ref 0.3–1.2)
Total Protein: 6.3 g/dL — ABNORMAL LOW (ref 6.5–8.1)

## 2021-02-03 LAB — CBC WITH DIFFERENTIAL (CANCER CENTER ONLY)
Abs Immature Granulocytes: 0.01 10*3/uL (ref 0.00–0.07)
Basophils Absolute: 0 10*3/uL (ref 0.0–0.1)
Basophils Relative: 1 %
Eosinophils Absolute: 0.1 10*3/uL (ref 0.0–0.5)
Eosinophils Relative: 2 %
HCT: 35.9 % — ABNORMAL LOW (ref 36.0–46.0)
Hemoglobin: 11.5 g/dL — ABNORMAL LOW (ref 12.0–15.0)
Immature Granulocytes: 0 %
Lymphocytes Relative: 29 %
Lymphs Abs: 1 10*3/uL (ref 0.7–4.0)
MCH: 30.3 pg (ref 26.0–34.0)
MCHC: 32 g/dL (ref 30.0–36.0)
MCV: 94.7 fL (ref 80.0–100.0)
Monocytes Absolute: 0.4 10*3/uL (ref 0.1–1.0)
Monocytes Relative: 10 %
Neutro Abs: 2.1 10*3/uL (ref 1.7–7.7)
Neutrophils Relative %: 58 %
Platelet Count: 148 10*3/uL — ABNORMAL LOW (ref 150–400)
RBC: 3.79 MIL/uL — ABNORMAL LOW (ref 3.87–5.11)
RDW: 13.7 % (ref 11.5–15.5)
WBC Count: 3.5 10*3/uL — ABNORMAL LOW (ref 4.0–10.5)
nRBC: 0 % (ref 0.0–0.2)

## 2021-02-03 MED ORDER — HEPARIN SOD (PORK) LOCK FLUSH 100 UNIT/ML IV SOLN
500.0000 [IU] | Freq: Once | INTRAVENOUS | Status: AC | PRN
  Administered 2021-02-03: 500 [IU]
  Filled 2021-02-03: qty 5

## 2021-02-03 MED ORDER — SODIUM CHLORIDE 0.9 % IV SOLN
Freq: Once | INTRAVENOUS | Status: AC
  Filled 2021-02-03: qty 250

## 2021-02-03 MED ORDER — SODIUM CHLORIDE 0.9 % IV SOLN
200.0000 mg | Freq: Once | INTRAVENOUS | Status: AC
  Administered 2021-02-03: 200 mg via INTRAVENOUS
  Filled 2021-02-03: qty 8

## 2021-02-03 MED ORDER — SODIUM CHLORIDE 0.9% FLUSH
10.0000 mL | INTRAVENOUS | Status: DC | PRN
  Administered 2021-02-03: 10 mL
  Filled 2021-02-03: qty 10

## 2021-02-03 NOTE — Progress Notes (Signed)
[facility] OFFICE PROGRESS NOTE   Diagnosis: Non-small cell lung cancer  INTERVAL HISTORY:   [name] returns as scheduled.  She completed another cycle of Pembrolizumab 01/14/2021.  No rash or diarrhea.  No nausea or vomiting.  She has a good appetite.  She continues to have left neck and bilateral hip pain.  Objective:  Vital signs in last 24 hours:  Blood pressure (!) 152/62, pulse 60, temperature 97.8 F (36.6 C), temperature source Oral, resp. rate 20, height '5\' 5"'  (1.651 m), weight 182 lb 3.2 oz (82.6 kg), SpO2 96 %.    HEENT: No thrush or ulcers. Resp: Lungs clear bilaterally. Cardio: Regular rate and rhythm. GI: Abdomen soft and nontender.  No hepatomegaly. Vascular: No leg edema. Skin: No rash. Port-A-Cath without erythema.   Lab Results:  Lab Results  Component Value Date   WBC 3.5 (L) 02/03/2021   HGB 11.5 (L) 02/03/2021   HCT 35.9 (L) 02/03/2021   MCV 94.7 02/03/2021   PLT 148 (L) 02/03/2021   NEUTROABS 2.1 02/03/2021    Imaging:  No results found.  Medications: I have reviewed the patient's current medications.  Assessment/Plan: Non-small cell lung cancer MRI lumbar spine 04/29/2019- enlarging marrow lesions involving the L1 vertebral body, upper left sacrum and right iliac bone MRI pelvis 04/29/2019- 3.5 cm left iliac bone lesion appears slightly larger; other similar appearing lesions present within the left superior pubic ramus, left superior abdomen acetabulum and upper left sacrum Kappa free light chains with mild elevation 05/12/2019  CTs 05/12/2019- left lower lobe pulmonary mass 3.3 x 3.2 cm; lytic process left iliac bone; spinal lesions; 1.1 cm low-density left kidney lesion; right thyroid enlargement with heterogeneous appearance with potential for multiple discrete lesions Biopsy left lower lobe lung mass 05/26/2019-poorly differentiated carcinoma; positive for cytokeratin 5/6, p63 and TTF-1, no EGFR, BRAF, ALK, ERBB2,ROS,  or NTRK alteration Cycle 1 carboplatin/Alimta/pembrolizumab 06/06/2019 Cycle 2 carboplatin/Alimta/pembrolizumab 06/27/2019 Cycle 3 carboplatin/Alimta/pembrolizumab 07/18/2019 Cycle 4 carboplatin/Alimta/pembrolizumab 08/07/2019 CTs 08/27/2019-significant decrease in size of lobulated mass left lower lobe.  Unchanged appearance of subtle bone lesions. Cycle 5 Alimta/pembrolizumab 08/28/2019 Cycle 6 Alimta/pembrolizumab 09/18/2019 Cycle 7 Alimta/pembrolizumab 10/09/2019 Cycle 8 Alimta/pembrolizumab 10/30/2019 Cycle 9 Alimta/pembrolizumab 11/20/2019 CTs 12/09/2019-no evidence of disease progression, left lower lobe nodule slightly decreased in size, stable L1, left sacral, and left pubic ramus metastases Cycle 10 Alimta/pembrolizumab 12/11/2019 Cycle 11 pembrolizumab alone 01/02/2020 (Alimta held due to edema, tenderness, erythema at the lower legs) Cycle 12 pembrolizumab 01/23/2020 Cycle 13 pembrolizumab 02/12/2020 Cycle 14 pembrolizumab 03/04/2020 CTs 03/18/2020-stable left lower lobe lesion, mild sclerosis at the superior endplate of L1 that was previously hypermetabolic, stable small left upper sacral lucent lesion, previous left superior pubic ramus lesion is occult on the CT, CT head negative for malignancy Cycle 15 pembrolizumab 03/25/2020 Cycle 16 pembrolizumab 04/21/2020 Cycle 17 pembrolizumab 05/17/2020 05/19/2020 bone scan-no definite abnormalities to suggest osseous metastases.  Areas of concern on prior PET-CT involving left iliac bone and L1 vertebral body showed no abnormalities on the current study Cycle 18 Pembrolizumab 06/10/2020 Cycle 19 Pembrolizumab 06/30/2020 CTs 07/16/2020-stable left lower lobe nodule, stable faint superior L1 vertebral lesion, no evidence of disease progression Cycle 20 pembrolizumab 07/21/2020 Cycle 21 Pembrolizumab 08/11/2020 Cycle 22 pembrolizumab 09/01/2020 Cycle 23 Pembrolizumab 09/22/2020 Cycle 24 pembrolizumab 10/13/2020 Cycle 25 Pembrolizumab 11/03/2020 Cycle 26  pembrolizumab 11/26/2020 CTs 12/15/2020- stable left lower lobe mass, stable sclerotic lesion at L2, no evidence of disease progression Cycle 27 pembrolizumab 12/17/2020 PET scan 01/03/2021-1.8 x 1.3 cm left lower  lobe nodule similar in size to CT of 12/15/2020 and measures smaller than previous PET/CT from 2020.  Nodule is markedly hypermetabolic.  No evidence for hypermetabolic hilar or mediastinal lymphadenopathy.  Several tiny foci of hypermetabolism identified in bony anatomy raising concern for skeletal metastases.  Comparison of the PET to CT 07/16/2020-lobular left lower lobe pulmonary nodule measured 2.4 x 1.5 cm on the prior study, current study it measured 1.8 x 1.3 cm.  Lesion in the anterior left acetabulum is similar to the 07/16/2020 exam although overlying cortical thinning slightly more pronounced on the current study.  Described lesion in the scapula shows some cortical sclerosis and a tiny central marrow lucency not substantially changed compared to 07/16/2020.  Left third rib lesion shows heterogeneous mineralization similar to 07/16/2020. MRI of cervical spine 01/13/2021-increased left facet edema at C5-C6, severe facet arthrosis on the left at C7-T1 and on the right at C3-C4, no evidence of metastatic disease Cycle 28 Pembrolizumab 01/14/2021 Cycle 29 Pembrolizumab 02/03/2021 Pain secondary to #1, improved Chronic back pain Type 2 diabetes Essential hypertension CAD Hyperlipidemia Family history significant for multiple members with breast cancer Grade 1 skin rash 07/18/2019 likely related to immunotherapy.  Topical steroid cream as needed. E. coli urinary tract infection 07/14/2019.  Completed cephalexin. Edema/tenderness at the right greater than left ankle 10/21/2019-etiology unclear, potentially related to systemic therapy or an infection, doxycycline prescribed-improved 10/23/2019; marked improvement 11/20/2019; at office visit 01/02/2020 she reports worsening of lower extremity edema,  pain/tenderness, erythema 3 to 4 days following each treatment.  Alimta held 01/02/2020.  Referral to dermatology. COVID-19 infection 03/30/2020, monoclonal antibody therapy 04/06/2020    Disposition: [name] appears unchanged.  There is no clinical evidence of disease progression.  Plan to continue Pembrolizumab every 3 weeks.  We reviewed the CBC and chemistry panel from today.  Labs adequate to proceed as above.  She is interested in a referral to rheumatology to evaluate arthritis pain.  Referral made to Dr. Amil Amen.  She will return for lab, follow-up, Pembrolizumab in 3 weeks.  She will contact the office in the interim with any problems.    Ned Card ANP/GNP-BC   02/03/2021  9:49 AM

## 2021-02-03 NOTE — Progress Notes (Unsigned)
Faxed referral order, demographics and chart information to Dr. Leigh Aurora at [phone].

## 2021-02-03 NOTE — Patient Instructions (Signed)
[facility]    Discharge Instructions:  Thank you for choosing [facility] to provide your oncology and hematology care.   If you have a lab appointment with the [facility], please go directly to the [facility] and check in at the registration area.   Wear comfortable clothing and clothing appropriate for easy access to any Portacath or PICC line.   We strive to give you quality time with your provider. You may need to reschedule your appointment if you arrive late (15 or more minutes).  Arriving late affects you and other patients whose appointments are after yours.  Also, if you miss three or more appointments without notifying the office, you may be dismissed from the clinic at the provider's discretion.      For prescription refill requests, have your pharmacy contact our office and allow 72 hours for refills to be completed.    Today you received the following chemotherapy and/or immunotherapy agents Pembrolizumab (KEYTRUDA).   To help prevent nausea and vomiting after your treatment, we encourage you to take your nausea medication as directed.  BELOW ARE SYMPTOMS THAT SHOULD BE REPORTED IMMEDIATELY: *FEVER GREATER THAN 100.4 F (38 C) OR HIGHER *CHILLS OR SWEATING *NAUSEA AND VOMITING THAT IS NOT CONTROLLED WITH YOUR NAUSEA MEDICATION *UNUSUAL SHORTNESS OF BREATH *UNUSUAL BRUISING OR BLEEDING *URINARY PROBLEMS (pain or burning when urinating, or frequent urination) *BOWEL PROBLEMS (unusual diarrhea, constipation, pain near the anus) TENDERNESS IN MOUTH AND THROAT WITH OR WITHOUT PRESENCE OF ULCERS (sore throat, sores in mouth, or a toothache) UNUSUAL RASH, SWELLING OR PAIN  UNUSUAL VAGINAL DISCHARGE OR ITCHING   Items with * indicate a potential emergency and should be followed up as soon as possible or go to the Emergency Department if any problems should occur.  Please show the CHEMOTHERAPY ALERT CARD or IMMUNOTHERAPY ALERT CARD  at check-in to the Emergency Department and triage nurse.  Should you have questions after your visit or need to cancel or reschedule your appointment, please contact [facility]  Dept: [phone]  and follow the prompts.  Office hours are 8:00 a.m. to 4:30 p.m. Monday - Friday. Please note that voicemails left after 4:00 p.m. may not be returned until the following business day.  We are closed weekends and major holidays. You have access to a nurse at all times for urgent questions. Please call the main number to the clinic Dept: [phone] and follow the prompts.   For any non-urgent questions, you may also contact your provider using MyChart. We now offer e-Visits for anyone 65 and older to request care online for non-urgent symptoms. For details visit [email or web address].   Also download the MyChart app! Go to the app store, search "MyChart", open the app, select [facility], and log in with your MyChart username and password.  Due to Covid, a mask is required upon entering the hospital/clinic. If you do not have a mask, one will be given to you upon arrival. For doctor visits, patients may have 1 support person aged 61 or older with them. For treatment visits, patients cannot have anyone with them due to current Covid guidelines and our immunocompromised population.   Pembrolizumab injection What is this medicine? PEMBROLIZUMAB (pem broe liz ue mab) is a monoclonal antibody. It is used to treat certain types of cancer. This medicine may be used for other purposes; ask your health care provider or pharmacist if you have questions. COMMON BRAND NAME(S): [employer]  What should I tell my health care provider before I take this medicine? They need to know if you have any of these conditions: autoimmune diseases like Crohn's disease, ulcerative colitis, or lupus have had or planning to have an allogeneic stem cell transplant (uses someone else's stem cells) history  of organ transplant history of chest radiation nervous system problems like myasthenia gravis or Guillain-Barre syndrome an unusual or allergic reaction to pembrolizumab, other medicines, foods, dyes, or preservatives pregnant or trying to get pregnant breast-feeding How should I use this medicine? This medicine is for infusion into a vein. It is given by a health care professional in a hospital or clinic setting. A special MedGuide will be given to you before each treatment. Be sure to read this information carefully each time. Talk to your pediatrician regarding the use of this medicine in children. While this drug may be prescribed for children as young as 6 months for selected conditions, precautions do apply. Overdosage: If you think you have taken too much of this medicine contact a poison control center or emergency room at once. NOTE: This medicine is only for you. Do not share this medicine with others. What if I miss a dose? It is important not to miss your dose. Call your doctor or health care professional if you are unable to keep an appointment. What may interact with this medicine? Interactions have not been studied. This list may not describe all possible interactions. Give your health care provider a list of all the medicines, herbs, non-prescription drugs, or dietary supplements you use. Also tell them if you smoke, drink alcohol, or use illegal drugs. Some items may interact with your medicine. What should I watch for while using this medicine? Your condition will be monitored carefully while you are receiving this medicine. You may need blood work done while you are taking this medicine. Do not become pregnant while taking this medicine or for 4 months after stopping it. Women should inform their doctor if they wish to become pregnant or think they might be pregnant. There is a potential for serious side effects to an unborn child. Talk to your health care professional or  pharmacist for more information. Do not breast-feed an infant while taking this medicine or for 4 months after the last dose. What side effects may I notice from receiving this medicine? Side effects that you should report to your doctor or health care professional as soon as possible: allergic reactions like skin rash, itching or hives, swelling of the face, lips, or tongue bloody or black, tarry breathing problems changes in vision chest pain chills confusion constipation cough diarrhea dizziness or feeling faint or lightheaded fast or irregular heartbeat fever flushing joint pain low blood counts - this medicine may decrease the number of white blood cells, red blood cells and platelets. You may be at increased risk for infections and bleeding. muscle pain muscle weakness pain, tingling, numbness in the hands or feet persistent headache redness, blistering, peeling or loosening of the skin, including inside the mouth signs and symptoms of high blood sugar such as dizziness; dry mouth; dry skin; fruity breath; nausea; stomach pain; increased hunger or thirst; increased urination signs and symptoms of kidney injury like trouble passing urine or change in the amount of urine signs and symptoms of liver injury like dark urine, light-colored stools, loss of appetite, nausea, right upper belly pain, yellowing of the eyes or skin sweating swollen lymph nodes weight loss Side effects that usually do not  require medical attention (report to your doctor or health care professional if they continue or are bothersome): decreased appetite hair loss tiredness This list may not describe all possible side effects. Call your doctor for medical advice about side effects. You may report side effects to FDA at 1-800-FDA-1088. Where should I keep my medicine? This drug is given in a hospital or clinic and will not be stored at home. NOTE: This sheet is a summary. It may not cover all possible  information. If you have questions about this medicine, talk to your doctor, pharmacist, or health care provider.  2021 Elsevier/Gold Standard (2019-06-18 21:44:53)

## 2021-02-04 ENCOUNTER — Other Ambulatory Visit: Payer: Medicare Other

## 2021-02-04 ENCOUNTER — Ambulatory Visit: Payer: Medicare Other

## 2021-02-04 ENCOUNTER — Ambulatory Visit: Payer: Medicare Other | Admitting: Oncology

## 2021-02-18 ENCOUNTER — Inpatient Hospital Stay: Payer: Medicare Other

## 2021-02-18 ENCOUNTER — Inpatient Hospital Stay: Payer: Medicare Other | Admitting: Nurse Practitioner

## 2021-02-19 ENCOUNTER — Other Ambulatory Visit: Payer: Self-pay | Admitting: Oncology

## 2021-02-24 ENCOUNTER — Other Ambulatory Visit: Payer: Self-pay | Admitting: Nurse Practitioner

## 2021-02-24 ENCOUNTER — Encounter: Payer: Self-pay | Admitting: Nurse Practitioner

## 2021-02-24 ENCOUNTER — Inpatient Hospital Stay (HOSPITAL_BASED_OUTPATIENT_CLINIC_OR_DEPARTMENT_OTHER): Payer: Medicare Other | Admitting: Nurse Practitioner

## 2021-02-24 ENCOUNTER — Inpatient Hospital Stay: Payer: Medicare Other

## 2021-02-24 ENCOUNTER — Other Ambulatory Visit: Payer: Self-pay

## 2021-02-24 ENCOUNTER — Ambulatory Visit (HOSPITAL_BASED_OUTPATIENT_CLINIC_OR_DEPARTMENT_OTHER)
Admission: RE | Admit: 2021-02-24 | Discharge: 2021-02-24 | Disposition: A | Discharge status: Home / Self Care | Payer: Medicare Other | Source: Ambulatory Visit | Attending: Nurse Practitioner | Admitting: Nurse Practitioner

## 2021-02-24 VITALS — BP 165/71 | HR 63 | Temp 97.8°F | Resp 20 | Ht 65.0 in | Wt 182.3 lb

## 2021-02-24 DIAGNOSIS — C3492 Malignant neoplasm of unspecified part of left bronchus or lung: Secondary | ICD-10-CM

## 2021-02-24 DIAGNOSIS — Z5112 Encounter for antineoplastic immunotherapy: Secondary | ICD-10-CM | POA: Diagnosis not present

## 2021-02-24 LAB — CBC WITH DIFFERENTIAL (CANCER CENTER ONLY)
Abs Immature Granulocytes: 0.01 10*3/uL (ref 0.00–0.07)
Basophils Absolute: 0 10*3/uL (ref 0.0–0.1)
Basophils Relative: 0 %
Eosinophils Absolute: 0.1 10*3/uL (ref 0.0–0.5)
Eosinophils Relative: 1 %
HCT: 36 % (ref 36.0–46.0)
Hemoglobin: 11.6 g/dL — ABNORMAL LOW (ref 12.0–15.0)
Immature Granulocytes: 0 %
Lymphocytes Relative: 21 %
Lymphs Abs: 1.1 10*3/uL (ref 0.7–4.0)
MCH: 30.4 pg (ref 26.0–34.0)
MCHC: 32.2 g/dL (ref 30.0–36.0)
MCV: 94.5 fL (ref 80.0–100.0)
Monocytes Absolute: 0.5 10*3/uL (ref 0.1–1.0)
Monocytes Relative: 9 %
Neutro Abs: 3.8 10*3/uL (ref 1.7–7.7)
Neutrophils Relative %: 69 %
Platelet Count: 148 10*3/uL — ABNORMAL LOW (ref 150–400)
RBC: 3.81 MIL/uL — ABNORMAL LOW (ref 3.87–5.11)
RDW: 13.9 % (ref 11.5–15.5)
WBC Count: 5.5 10*3/uL (ref 4.0–10.5)
nRBC: 0 % (ref 0.0–0.2)

## 2021-02-24 LAB — CMP (CANCER CENTER ONLY)
ALT: 17 U/L (ref 0–44)
AST: 17 U/L (ref 15–41)
Albumin: 4.2 g/dL (ref 3.5–5.0)
Alkaline Phosphatase: 56 U/L (ref 38–126)
Anion gap: 8 (ref 5–15)
BUN: 19 mg/dL (ref 8–23)
CO2: 25 mmol/L (ref 22–32)
Calcium: 9.5 mg/dL (ref 8.9–10.3)
Chloride: 104 mmol/L (ref 98–111)
Creatinine: 0.75 mg/dL (ref 0.44–1.00)
GFR, Estimated: >60 mL/min (ref >60)
Glucose, Bld: 97 mg/dL (ref 70–99)
Potassium: 4.4 mmol/L (ref 3.5–5.1)
Sodium: 137 mmol/L (ref 135–145)
Total Bilirubin: 0.4 mg/dL (ref 0.3–1.2)
Total Protein: 6.5 g/dL (ref 6.5–8.1)

## 2021-02-24 LAB — TSH: TSH: 1.075 u[IU]/mL (ref 0.350–4.500)

## 2021-02-24 MED ORDER — SODIUM CHLORIDE 0.9 % IV SOLN
Freq: Once | INTRAVENOUS | Status: AC
  Filled 2021-02-24: qty 250

## 2021-02-24 MED ORDER — HEPARIN SOD (PORK) LOCK FLUSH 100 UNIT/ML IV SOLN
500.0000 [IU] | Freq: Once | INTRAVENOUS | Status: AC | PRN
  Administered 2021-02-24: 500 [IU]
  Filled 2021-02-24: qty 5

## 2021-02-24 MED ORDER — SODIUM CHLORIDE 0.9% FLUSH
10.0000 mL | INTRAVENOUS | Status: DC | PRN
  Administered 2021-02-24: 10 mL
  Filled 2021-02-24: qty 10

## 2021-02-24 MED ORDER — SODIUM CHLORIDE 0.9 % IV SOLN
200.0000 mg | Freq: Once | INTRAVENOUS | Status: AC
  Administered 2021-02-24: 200 mg via INTRAVENOUS
  Filled 2021-02-24: qty 8

## 2021-02-24 NOTE — Progress Notes (Signed)
[facility] OFFICE PROGRESS NOTE   Diagnosis: Non-small cell lung cancer  INTERVAL HISTORY:   [name] returns as scheduled.  She completed another cycle of Pembrolizumab 02/03/2021.  No rash or diarrhea.  No nausea or vomiting.  She reports increased pain at the left hip since her last visit.  The pain is worse with weightbearing and if she sits in certain positions.  She continues to have neck and back pain.  Objective:  Vital signs in last 24 hours:  Blood pressure (!) 165/71, pulse 63, temperature 97.8 F (36.6 C), temperature source Oral, resp. rate 20, height '5\' 5"'  (1.651 m), weight 182 lb 4.8 oz (82.7 kg), SpO2 96 %.    HEENT: No thrush or ulcers. Resp: Lungs clear bilaterally. Cardio: Regular rate and rhythm. GI: Abdomen soft and nontender.  No hepatomegaly. Vascular: No leg edema. Neuro: Leg strength appears intact. Skin: No rash. Port-A-Cath without erythema.  Lab Results:  Lab Results  Component Value Date   WBC 5.5 02/24/2021   HGB 11.6 (L) 02/24/2021   HCT 36.0 02/24/2021   MCV 94.5 02/24/2021   PLT 148 (L) 02/24/2021   NEUTROABS 3.8 02/24/2021    Imaging:  No results found.  Medications: I have reviewed the patient's current medications.  Assessment/Plan: Non-small cell lung cancer MRI lumbar spine 04/29/2019- enlarging marrow lesions involving the L1 vertebral body, upper left sacrum and right iliac bone MRI pelvis 04/29/2019- 3.5 cm left iliac bone lesion appears slightly larger; other similar appearing lesions present within the left superior pubic ramus, left superior abdomen acetabulum and upper left sacrum Kappa free light chains with mild elevation 05/12/2019  CTs 05/12/2019- left lower lobe pulmonary mass 3.3 x 3.2 cm; lytic process left iliac bone; spinal lesions; 1.1 cm low-density left kidney lesion; right thyroid enlargement with heterogeneous appearance with potential for multiple discrete lesions Biopsy left lower lobe  lung mass 05/26/2019-poorly differentiated carcinoma; positive for cytokeratin 5/6, p63 and TTF-1, no EGFR, BRAF, ALK, ERBB2,ROS, or NTRK alteration Cycle 1 carboplatin/Alimta/pembrolizumab 06/06/2019 Cycle 2 carboplatin/Alimta/pembrolizumab 06/27/2019 Cycle 3 carboplatin/Alimta/pembrolizumab 07/18/2019 Cycle 4 carboplatin/Alimta/pembrolizumab 08/07/2019 CTs 08/27/2019-significant decrease in size of lobulated mass left lower lobe.  Unchanged appearance of subtle bone lesions. Cycle 5 Alimta/pembrolizumab 08/28/2019 Cycle 6 Alimta/pembrolizumab 09/18/2019 Cycle 7 Alimta/pembrolizumab 10/09/2019 Cycle 8 Alimta/pembrolizumab 10/30/2019 Cycle 9 Alimta/pembrolizumab 11/20/2019 CTs 12/09/2019-no evidence of disease progression, left lower lobe nodule slightly decreased in size, stable L1, left sacral, and left pubic ramus metastases Cycle 10 Alimta/pembrolizumab 12/11/2019 Cycle 11 pembrolizumab alone 01/02/2020 (Alimta held due to edema, tenderness, erythema at the lower legs) Cycle 12 pembrolizumab 01/23/2020 Cycle 13 pembrolizumab 02/12/2020 Cycle 14 pembrolizumab 03/04/2020 CTs 03/18/2020-stable left lower lobe lesion, mild sclerosis at the superior endplate of L1 that was previously hypermetabolic, stable small left upper sacral lucent lesion, previous left superior pubic ramus lesion is occult on the CT, CT head negative for malignancy Cycle 15 pembrolizumab 03/25/2020 Cycle 16 pembrolizumab 04/21/2020 Cycle 17 pembrolizumab 05/17/2020 05/19/2020 bone scan-no definite abnormalities to suggest osseous metastases.  Areas of concern on prior PET-CT involving left iliac bone and L1 vertebral body showed no abnormalities on the current study Cycle 18 Pembrolizumab 06/10/2020 Cycle 19 Pembrolizumab 06/30/2020 CTs 07/16/2020-stable left lower lobe nodule, stable faint superior L1 vertebral lesion, no evidence of disease progression Cycle 20 pembrolizumab 07/21/2020 Cycle 21 Pembrolizumab 08/11/2020 Cycle 22  pembrolizumab 09/01/2020 Cycle 23 Pembrolizumab 09/22/2020 Cycle 24 pembrolizumab 10/13/2020 Cycle 25 Pembrolizumab 11/03/2020 Cycle 26 pembrolizumab 11/26/2020 CTs 12/15/2020- stable left lower lobe mass, stable  sclerotic lesion at L2, no evidence of disease progression Cycle 27 pembrolizumab 12/17/2020 PET scan 01/03/2021-1.8 x 1.3 cm left lower lobe nodule similar in size to CT of 12/15/2020 and measures smaller than previous PET/CT from 2020.  Nodule is markedly hypermetabolic.  No evidence for hypermetabolic hilar or mediastinal lymphadenopathy.  Several tiny foci of hypermetabolism identified in bony anatomy raising concern for skeletal metastases.  Comparison of the PET to CT 07/16/2020-lobular left lower lobe pulmonary nodule measured 2.4 x 1.5 cm on the prior study, current study it measured 1.8 x 1.3 cm.  Lesion in the anterior left acetabulum is similar to the 07/16/2020 exam although overlying cortical thinning slightly more pronounced on the current study.  Described lesion in the scapula shows some cortical sclerosis and a tiny central marrow lucency not substantially changed compared to 07/16/2020.  Left third rib lesion shows heterogeneous mineralization similar to 07/16/2020. MRI of cervical spine 01/13/2021-increased left facet edema at C5-C6, severe facet arthrosis on the left at C7-T1 and on the right at C3-C4, no evidence of metastatic disease Cycle 28 Pembrolizumab 01/14/2021 Cycle 29 Pembrolizumab 02/03/2021 Cycle 30 Pembrolizumab 02/24/2021 Pain secondary to #1, improved Chronic back pain Type 2 diabetes Essential hypertension CAD Hyperlipidemia Family history significant for multiple members with breast cancer Grade 1 skin rash 07/18/2019 likely related to immunotherapy.  Topical steroid cream as needed. E. coli urinary tract infection 07/14/2019.  Completed cephalexin. Edema/tenderness at the right greater than left ankle 10/21/2019-etiology unclear, potentially related to systemic  therapy or an infection, doxycycline prescribed-improved 10/23/2019; marked improvement 11/20/2019; at office visit 01/02/2020 she reports worsening of lower extremity edema, pain/tenderness, erythema 3 to 4 days following each treatment.  Alimta held 01/02/2020.  Referral to dermatology. COVID-19 infection 03/30/2020, monoclonal antibody therapy 04/06/2020    Disposition: [name] appears unchanged.  She continues every 3-week Pembrolizumab, treatment today.    We reviewed the CBC and chemistry panel from today.  Labs adequate to proceed as above.    She is having increased pain at the left hip.  Left acetabular lesion noted on recent PET scan.  Plan for plain x-ray of the left hip today.  She will return for lab, follow-up, Pembrolizumab in 3 weeks.    Ned Card ANP/GNP-BC   02/24/2021  11:33 AM

## 2021-02-24 NOTE — Patient Instructions (Signed)
[facility]  Discharge Instructions: Thank you for choosing [facility] to provide your oncology and hematology care.   If you have a lab appointment with the [facility], please go directly to the [facility] and check in at the registration area.   Wear comfortable clothing and clothing appropriate for easy access to any Portacath or PICC line.   We strive to give you quality time with your provider. You may need to reschedule your appointment if you arrive late (15 or more minutes).  Arriving late affects you and other patients whose appointments are after yours.  Also, if you miss three or more appointments without notifying the office, you may be dismissed from the clinic at the provider's discretion.      For prescription refill requests, have your pharmacy contact our office and allow 72 hours for refills to be completed.    Today you received the following chemotherapy and/or immunotherapy agents pembrolizumab    To help prevent nausea and vomiting after your treatment, we encourage you to take your nausea medication as directed.  BELOW ARE SYMPTOMS THAT SHOULD BE REPORTED IMMEDIATELY: *FEVER GREATER THAN 100.4 F (38 C) OR HIGHER *CHILLS OR SWEATING *NAUSEA AND VOMITING THAT IS NOT CONTROLLED WITH YOUR NAUSEA MEDICATION *UNUSUAL SHORTNESS OF BREATH *UNUSUAL BRUISING OR BLEEDING *URINARY PROBLEMS (pain or burning when urinating, or frequent urination) *BOWEL PROBLEMS (unusual diarrhea, constipation, pain near the anus) TENDERNESS IN MOUTH AND THROAT WITH OR WITHOUT PRESENCE OF ULCERS (sore throat, sores in mouth, or a toothache) UNUSUAL RASH, SWELLING OR PAIN  UNUSUAL VAGINAL DISCHARGE OR ITCHING   Items with * indicate a potential emergency and should be followed up as soon as possible or go to the Emergency Department if any problems should occur.  Please show the CHEMOTHERAPY ALERT CARD or IMMUNOTHERAPY ALERT CARD at check-in to  the Emergency Department and triage nurse.  Should you have questions after your visit or need to cancel or reschedule your appointment, please contact [facility]  Dept: [phone]  and follow the prompts.  Office hours are 8:00 a.m. to 4:30 p.m. Monday - Friday. Please note that voicemails left after 4:00 p.m. may not be returned until the following business day.  We are closed weekends and major holidays. You have access to a nurse at all times for urgent questions. Please call the main number to the clinic Dept: [phone] and follow the prompts.   For any non-urgent questions, you may also contact your provider using MyChart. We now offer e-Visits for anyone 32 and older to request care online for non-urgent symptoms. For details visit [email or web address].   Also download the MyChart app! Go to the app store, search "MyChart", open the app, select [facility], and log in with your MyChart username and password.  Due to Covid, a mask is required upon entering the hospital/clinic. If you do not have a mask, one will be given to you upon arrival. For doctor visits, patients may have 1 support person aged 44 or older with them. For treatment visits, patients cannot have anyone with them due to current Covid guidelines and our immunocompromised population.   Pembrolizumab injection What is this medication? PEMBROLIZUMAB (pem broe liz ue mab) is a monoclonal antibody. It is used totreat certain types of cancer. This medicine may be used for other purposes; ask your health care provider orpharmacist if you have questions. COMMON BRAND NAME(S): Keytruda What should I tell my  care team before I take this medication? They need to know if you have any of these conditions: autoimmune diseases like Crohn's disease, ulcerative colitis, or lupus have had or planning to have an allogeneic stem cell transplant (uses someone else's stem cells) history of organ  transplant history of chest radiation nervous system problems like myasthenia gravis or Guillain-Barre syndrome an unusual or allergic reaction to pembrolizumab, other medicines, foods, dyes, or preservatives pregnant or trying to get pregnant breast-feeding How should I use this medication? This medicine is for infusion into a vein. It is given by a health careprofessional in a hospital or clinic setting. A special MedGuide will be given to you before each treatment. Be sure to readthis information carefully each time. Talk to your pediatrician regarding the use of this medicine in children. While this drug may be prescribed for children as young as 6 months for selectedconditions, precautions do apply. Overdosage: If you think you have taken too much of this medicine contact apoison control center or emergency room at once. NOTE: This medicine is only for you. Do not share this medicine with others. What if I miss a dose? It is important not to miss your dose. Call your doctor or health careprofessional if you are unable to keep an appointment. What may interact with this medication? Interactions have not been studied. This list may not describe all possible interactions. Give your health care provider a list of all the medicines, herbs, non-prescription drugs, or dietary supplements you use. Also tell them if you smoke, drink alcohol, or use illegaldrugs. Some items may interact with your medicine. What should I watch for while using this medication? Your condition will be monitored carefully while you are receiving thismedicine. You may need blood work done while you are taking this medicine. Do not become pregnant while taking this medicine or for 4 months after stopping it. Women should inform their doctor if they wish to become pregnant or think they might be pregnant. There is a potential for serious side effects to an unborn child. Talk to your health care professional or pharmacist for  more information. Do not breast-feed an infant while taking this medicine orfor 4 months after the last dose. What side effects may I notice from receiving this medication? Side effects that you should report to your doctor or health care professionalas soon as possible: allergic reactions like skin rash, itching or hives, swelling of the face, lips, or tongue bloody or black, tarry breathing problems changes in vision chest pain chills confusion constipation cough diarrhea dizziness or feeling faint or lightheaded fast or irregular heartbeat fever flushing joint pain low blood counts - this medicine may decrease the number of white blood cells, red blood cells and platelets. You may be at increased risk for infections and bleeding. muscle pain muscle weakness pain, tingling, numbness in the hands or feet persistent headache redness, blistering, peeling or loosening of the skin, including inside the mouth signs and symptoms of high blood sugar such as dizziness; dry mouth; dry skin; fruity breath; nausea; stomach pain; increased hunger or thirst; increased urination signs and symptoms of kidney injury like trouble passing urine or change in the amount of urine signs and symptoms of liver injury like dark urine, light-colored stools, loss of appetite, nausea, right upper belly pain, yellowing of the eyes or skin sweating swollen lymph nodes weight loss Side effects that usually do not require medical attention (report to yourdoctor or health care professional if they continue or are  bothersome): decreased appetite hair loss tiredness This list may not describe all possible side effects. Call your doctor for medical advice about side effects. You may report side effects to FDA at1-800-FDA-1088. Where should I keep my medication? This drug is given in a hospital or clinic and will not be stored at home. NOTE: This sheet is a summary. It may not cover all possible information. If you  have questions about this medicine, talk to your doctor, pharmacist, orhealth care provider.  2022 Elsevier/Gold Standard (2019-06-18 21:44:53)

## 2021-02-25 ENCOUNTER — Encounter: Payer: Self-pay | Admitting: Nurse Practitioner

## 2021-02-28 ENCOUNTER — Telehealth: Payer: Self-pay | Admitting: Nurse Practitioner

## 2021-02-28 NOTE — Telephone Encounter (Signed)
I contacted Ms. Heather with the left hip x-ray results, mainly degenerative changes, no abnormality corresponding to the PET scan.

## 2021-03-11 ENCOUNTER — Other Ambulatory Visit: Payer: Self-pay | Admitting: Nurse Practitioner

## 2021-03-11 ENCOUNTER — Telehealth: Payer: Self-pay | Admitting: Radiation Oncology

## 2021-03-11 ENCOUNTER — Telehealth: Payer: Self-pay | Admitting: Nurse Practitioner

## 2021-03-11 ENCOUNTER — Encounter: Payer: Self-pay | Admitting: Nurse Practitioner

## 2021-03-11 DIAGNOSIS — C3492 Malignant neoplasm of unspecified part of left bronchus or lung: Secondary | ICD-10-CM

## 2021-03-11 MED ORDER — HYDROCODONE-ACETAMINOPHEN 5-325 MG PO TABS: 1.0000 | ORAL_TABLET | Freq: Four times a day (QID) | ORAL | 0 refills | Status: DC | PRN

## 2021-03-11 NOTE — Telephone Encounter (Signed)
I contacted [name] by phone regarding her MyChart messages.  She is having increased left hip pain.  The pain is different than her typical "arthritis pain".  She is taking Tylenol and tramadol with minimal relief.  She reports having an MRI of the left hip through the neurosurgeons office.  She has requested they send [place] this report.  I recommend she significantly limit weightbearing in case this is related to cancer.  She would like a stronger pain medication.  She has a a codeine allergy listed.  She reports nausea/vomiting and constipation.  We decided to try Norco 1 to 2 tablets every 6 hours as needed.  She can take nausea medicine and a laxative if needed.  Prescription sent to her pharmacy.  She has an appointment here on 03/17/2021.  We will follow-up on the results of the recent MRI.

## 2021-03-11 NOTE — Telephone Encounter (Signed)
I called [name] to let her know we were able to obtain a copy of the left hip MRI report done on 03/02/2021.  Dr. Benay Spice has reviewed the report.  There has been mild increase in size of the lesion at the left iliac bone and left anterior acetabulum.  Dr. Benay Spice recommends a referral to radiation oncology.  The referral order has been placed.

## 2021-03-11 NOTE — Telephone Encounter (Signed)
Called patient to schedule consultation with Dr. Lisbeth Renshaw. No answer, LVM for return call.

## 2021-03-13 ENCOUNTER — Other Ambulatory Visit: Payer: Self-pay | Admitting: Oncology

## 2021-03-15 NOTE — Progress Notes (Signed)
Histology and Location of Primary Cancer: Non-small cell lung cancer- Left  Location(s) of Symptomatic Metastases: Left Iliac bone and left anterior acetabulum  MRI 03/02/2021: Metastatic involvement of the left iliac bone, with lesions mildly increased in size from 04/29/2019.  Essentially stable lesion of the left superior pubic ramus, with reduced conspicuity of a left upper sacral lesion compared to 04/29/2019.  Past/Anticipated chemotherapy by medical oncology, if any:  Dr. Benay Spice -Currently on treatment for lung cancer.   -Has completed 30 cycles of Pembrolizumab -Next infusion 03/17/2021   Pain on a scale of 0-10 is: She states her pain is not out of the ordinary, has pain from her neck down.   Ambulatory status? Walker? Wheelchair?: Uses a cane  SAFETY ISSUES: Prior radiation? No Pacemaker/ICD? No Pacer, has a stent Possible current pregnancy? Hysterectomy Is the patient on methotrexate? No  Current Complaints / other details:   Has [facility]

## 2021-03-17 ENCOUNTER — Encounter: Payer: Self-pay | Admitting: Radiation Oncology

## 2021-03-17 ENCOUNTER — Ambulatory Visit
Admission: RE | Admit: 2021-03-17 | Discharge: 2021-03-17 | Disposition: A | Discharge status: Home / Self Care | Payer: Self-pay | Source: Ambulatory Visit | Attending: Radiation Oncology | Admitting: Radiation Oncology

## 2021-03-17 ENCOUNTER — Inpatient Hospital Stay: Payer: Medicare Other | Attending: Oncology

## 2021-03-17 ENCOUNTER — Other Ambulatory Visit: Payer: Self-pay

## 2021-03-17 ENCOUNTER — Inpatient Hospital Stay: Payer: Medicare Other

## 2021-03-17 ENCOUNTER — Ambulatory Visit
Admission: RE | Admit: 2021-03-17 | Discharge: 2021-03-17 | Disposition: A | Discharge status: Home / Self Care | Payer: Medicare Other | Source: Ambulatory Visit | Attending: Radiation Oncology | Admitting: Radiation Oncology

## 2021-03-17 ENCOUNTER — Inpatient Hospital Stay (HOSPITAL_BASED_OUTPATIENT_CLINIC_OR_DEPARTMENT_OTHER): Payer: Medicare Other | Admitting: Oncology

## 2021-03-17 VITALS — BP 144/60 | HR 57 | Temp 97.9°F | Resp 18 | Wt 186.4 lb

## 2021-03-17 VITALS — BP 158/69 | HR 60 | Temp 97.8°F | Resp 18 | Ht 65.0 in | Wt 184.8 lb

## 2021-03-17 DIAGNOSIS — G2581 Restless legs syndrome: Secondary | ICD-10-CM | POA: Diagnosis not present

## 2021-03-17 DIAGNOSIS — E042 Nontoxic multinodular goiter: Secondary | ICD-10-CM | POA: Diagnosis not present

## 2021-03-17 DIAGNOSIS — C3492 Malignant neoplasm of unspecified part of left bronchus or lung: Secondary | ICD-10-CM

## 2021-03-17 DIAGNOSIS — Z5112 Encounter for antineoplastic immunotherapy: Secondary | ICD-10-CM | POA: Insufficient documentation

## 2021-03-17 DIAGNOSIS — R011 Cardiac murmur, unspecified: Secondary | ICD-10-CM | POA: Diagnosis not present

## 2021-03-17 DIAGNOSIS — G893 Neoplasm related pain (acute) (chronic): Secondary | ICD-10-CM | POA: Diagnosis not present

## 2021-03-17 DIAGNOSIS — C3432 Malignant neoplasm of lower lobe, left bronchus or lung: Secondary | ICD-10-CM | POA: Insufficient documentation

## 2021-03-17 DIAGNOSIS — Z7984 Long term (current) use of oral hypoglycemic drugs: Secondary | ICD-10-CM | POA: Insufficient documentation

## 2021-03-17 DIAGNOSIS — I2510 Atherosclerotic heart disease of native coronary artery without angina pectoris: Secondary | ICD-10-CM | POA: Diagnosis not present

## 2021-03-17 DIAGNOSIS — C7951 Secondary malignant neoplasm of bone: Secondary | ICD-10-CM | POA: Insufficient documentation

## 2021-03-17 DIAGNOSIS — I10 Essential (primary) hypertension: Secondary | ICD-10-CM | POA: Insufficient documentation

## 2021-03-17 DIAGNOSIS — Z803 Family history of malignant neoplasm of breast: Secondary | ICD-10-CM | POA: Diagnosis not present

## 2021-03-17 DIAGNOSIS — Z87891 Personal history of nicotine dependence: Secondary | ICD-10-CM | POA: Diagnosis not present

## 2021-03-17 DIAGNOSIS — M810 Age-related osteoporosis without current pathological fracture: Secondary | ICD-10-CM | POA: Insufficient documentation

## 2021-03-17 DIAGNOSIS — E785 Hyperlipidemia, unspecified: Secondary | ICD-10-CM | POA: Diagnosis not present

## 2021-03-17 DIAGNOSIS — K449 Diaphragmatic hernia without obstruction or gangrene: Secondary | ICD-10-CM | POA: Diagnosis not present

## 2021-03-17 DIAGNOSIS — Z79899 Other long term (current) drug therapy: Secondary | ICD-10-CM | POA: Diagnosis not present

## 2021-03-17 HISTORY — DX: Malignant neoplasm of unspecified part of unspecified bronchus or lung: C34.90

## 2021-03-17 LAB — CBC WITH DIFFERENTIAL (CANCER CENTER ONLY)
Abs Immature Granulocytes: 0.02 10*3/uL (ref 0.00–0.07)
Basophils Absolute: 0 10*3/uL (ref 0.0–0.1)
Basophils Relative: 0 %
Eosinophils Absolute: 0.1 10*3/uL (ref 0.0–0.5)
Eosinophils Relative: 1 %
HCT: 35.4 % — ABNORMAL LOW (ref 36.0–46.0)
Hemoglobin: 11.3 g/dL — ABNORMAL LOW (ref 12.0–15.0)
Immature Granulocytes: 0 %
Lymphocytes Relative: 23 %
Lymphs Abs: 1.2 10*3/uL (ref 0.7–4.0)
MCH: 30.2 pg (ref 26.0–34.0)
MCHC: 31.9 g/dL (ref 30.0–36.0)
MCV: 94.7 fL (ref 80.0–100.0)
Monocytes Absolute: 0.4 10*3/uL (ref 0.1–1.0)
Monocytes Relative: 7 %
Neutro Abs: 3.6 10*3/uL (ref 1.7–7.7)
Neutrophils Relative %: 69 %
Platelet Count: 156 10*3/uL (ref 150–400)
RBC: 3.74 MIL/uL — ABNORMAL LOW (ref 3.87–5.11)
RDW: 13.8 % (ref 11.5–15.5)
WBC Count: 5.3 10*3/uL (ref 4.0–10.5)
nRBC: 0 % (ref 0.0–0.2)

## 2021-03-17 LAB — CMP (CANCER CENTER ONLY)
ALT: 17 U/L (ref 0–44)
AST: 19 U/L (ref 15–41)
Albumin: 4.2 g/dL (ref 3.5–5.0)
Alkaline Phosphatase: 67 U/L (ref 38–126)
Anion gap: 9 (ref 5–15)
BUN: 21 mg/dL (ref 8–23)
CO2: 25 mmol/L (ref 22–32)
Calcium: 9.7 mg/dL (ref 8.9–10.3)
Chloride: 104 mmol/L (ref 98–111)
Creatinine: 0.75 mg/dL (ref 0.44–1.00)
GFR, Estimated: >60 mL/min (ref >60)
Glucose, Bld: 106 mg/dL — ABNORMAL HIGH (ref 70–99)
Potassium: 4.5 mmol/L (ref 3.5–5.1)
Sodium: 138 mmol/L (ref 135–145)
Total Bilirubin: 0.4 mg/dL (ref 0.3–1.2)
Total Protein: 6.6 g/dL (ref 6.5–8.1)

## 2021-03-17 MED ORDER — HEPARIN SOD (PORK) LOCK FLUSH 100 UNIT/ML IV SOLN
500.0000 [IU] | Freq: Once | INTRAVENOUS | Status: AC | PRN
  Administered 2021-03-17: 500 [IU]

## 2021-03-17 MED ORDER — SODIUM CHLORIDE 0.9 % IV SOLN
200.0000 mg | Freq: Once | INTRAVENOUS | Status: AC
  Administered 2021-03-17: 200 mg via INTRAVENOUS
  Filled 2021-03-17: qty 8

## 2021-03-17 MED ORDER — SODIUM CHLORIDE 0.9 % IV SOLN: Freq: Once | INTRAVENOUS | Status: AC

## 2021-03-17 MED ORDER — SODIUM CHLORIDE 0.9% FLUSH
10.0000 mL | INTRAVENOUS | Status: DC | PRN
  Administered 2021-03-17: 10 mL

## 2021-03-17 NOTE — Patient Instructions (Signed)
[facility]   Discharge Instructions: Thank you for choosing [facility] to provide your oncology and hematology care.   If you have a lab appointment with the [facility], please go directly to the [facility] and check in at the registration area.   Wear comfortable clothing and clothing appropriate for easy access to any Portacath or PICC line.   We strive to give you quality time with your provider. You may need to reschedule your appointment if you arrive late (15 or more minutes).  Arriving late affects you and other patients whose appointments are after yours.  Also, if you miss three or more appointments without notifying the office, you may be dismissed from the clinic at the provider's discretion.      For prescription refill requests, have your pharmacy contact our office and allow 72 hours for refills to be completed.    Today you received the following chemotherapy and/or immunotherapy agents Pembrolizumab (KEYTRUDA).      To help prevent nausea and vomiting after your treatment, we encourage you to take your nausea medication as directed.  BELOW ARE SYMPTOMS THAT SHOULD BE REPORTED IMMEDIATELY: *FEVER GREATER THAN 100.4 F (38 C) OR HIGHER *CHILLS OR SWEATING *NAUSEA AND VOMITING THAT IS NOT CONTROLLED WITH YOUR NAUSEA MEDICATION *UNUSUAL SHORTNESS OF BREATH *UNUSUAL BRUISING OR BLEEDING *URINARY PROBLEMS (pain or burning when urinating, or frequent urination) *BOWEL PROBLEMS (unusual diarrhea, constipation, pain near the anus) TENDERNESS IN MOUTH AND THROAT WITH OR WITHOUT PRESENCE OF ULCERS (sore throat, sores in mouth, or a toothache) UNUSUAL RASH, SWELLING OR PAIN  UNUSUAL VAGINAL DISCHARGE OR ITCHING   Items with * indicate a potential emergency and should be followed up as soon as possible or go to the Emergency Department if any problems should occur.  Please show the CHEMOTHERAPY ALERT CARD or IMMUNOTHERAPY ALERT CARD  at check-in to the Emergency Department and triage nurse.  Should you have questions after your visit or need to cancel or reschedule your appointment, please contact [facility]  Dept: [phone]  and follow the prompts.  Office hours are 8:00 a.m. to 4:30 p.m. Monday - Friday. Please note that voicemails left after 4:00 p.m. may not be returned until the following business day.  We are closed weekends and major holidays. You have access to a nurse at all times for urgent questions. Please call the main number to the clinic Dept: [phone] and follow the prompts.   For any non-urgent questions, you may also contact your provider using MyChart. We now offer e-Visits for anyone 55 and older to request care online for non-urgent symptoms. For details visit [email or web address].   Also download the MyChart app! Go to the app store, search "MyChart", open the app, select [facility], and log in with your MyChart username and password.  Due to Covid, a mask is required upon entering the hospital/clinic. If you do not have a mask, one will be given to you upon arrival. For doctor visits, patients may have 1 support person aged 67 or older with them. For treatment visits, patients cannot have anyone with them due to current Covid guidelines and our immunocompromised population.   Pembrolizumab injection What is this medication? PEMBROLIZUMAB (pem broe liz ue mab) is a monoclonal antibody. It is used totreat certain types of cancer. This medicine may be used for other purposes; ask your health care provider orpharmacist if you have questions. COMMON BRAND NAME(S): Keytruda What  should I tell my care team before I take this medication? They need to know if you have any of these conditions: autoimmune diseases like Crohn's disease, ulcerative colitis, or lupus have had or planning to have an allogeneic stem cell transplant (uses someone else's stem cells) history of organ  transplant history of chest radiation nervous system problems like myasthenia gravis or Guillain-Barre syndrome an unusual or allergic reaction to pembrolizumab, other medicines, foods, dyes, or preservatives pregnant or trying to get pregnant breast-feeding How should I use this medication? This medicine is for infusion into a vein. It is given by a health careprofessional in a hospital or clinic setting. A special MedGuide will be given to you before each treatment. Be sure to readthis information carefully each time. Talk to your pediatrician regarding the use of this medicine in children. While this drug may be prescribed for children as young as 6 months for selectedconditions, precautions do apply. Overdosage: If you think you have taken too much of this medicine contact apoison control center or emergency room at once. NOTE: This medicine is only for you. Do not share this medicine with others. What if I miss a dose? It is important not to miss your dose. Call your doctor or health careprofessional if you are unable to keep an appointment. What may interact with this medication? Interactions have not been studied. This list may not describe all possible interactions. Give your health care provider a list of all the medicines, herbs, non-prescription drugs, or dietary supplements you use. Also tell them if you smoke, drink alcohol, or use illegaldrugs. Some items may interact with your medicine. What should I watch for while using this medication? Your condition will be monitored carefully while you are receiving thismedicine. You may need blood work done while you are taking this medicine. Do not become pregnant while taking this medicine or for 4 months after stopping it. Women should inform their doctor if they wish to become pregnant or think they might be pregnant. There is a potential for serious side effects to an unborn child. Talk to your health care professional or pharmacist for  more information. Do not breast-feed an infant while taking this medicine orfor 4 months after the last dose. What side effects may I notice from receiving this medication? Side effects that you should report to your doctor or health care professionalas soon as possible: allergic reactions like skin rash, itching or hives, swelling of the face, lips, or tongue bloody or black, tarry breathing problems changes in vision chest pain chills confusion constipation cough diarrhea dizziness or feeling faint or lightheaded fast or irregular heartbeat fever flushing joint pain low blood counts - this medicine may decrease the number of white blood cells, red blood cells and platelets. You may be at increased risk for infections and bleeding. muscle pain muscle weakness pain, tingling, numbness in the hands or feet persistent headache redness, blistering, peeling or loosening of the skin, including inside the mouth signs and symptoms of high blood sugar such as dizziness; dry mouth; dry skin; fruity breath; nausea; stomach pain; increased hunger or thirst; increased urination signs and symptoms of kidney injury like trouble passing urine or change in the amount of urine signs and symptoms of liver injury like dark urine, light-colored stools, loss of appetite, nausea, right upper belly pain, yellowing of the eyes or skin sweating swollen lymph nodes weight loss Side effects that usually do not require medical attention (report to yourdoctor or health care professional if  they continue or are bothersome): decreased appetite hair loss tiredness This list may not describe all possible side effects. Call your doctor for medical advice about side effects. You may report side effects to FDA at1-800-FDA-1088. Where should I keep my medication? This drug is given in a hospital or clinic and will not be stored at home. NOTE: This sheet is a summary. It may not cover all possible information. If you  have questions about this medicine, talk to your doctor, pharmacist, orhealth care provider.  2022 Elsevier/Gold Standard (2019-06-18 21:44:53)

## 2021-03-17 NOTE — Progress Notes (Signed)
Radiation Oncology         [phone] ________________________________  Name: [name]        MRN: [id number]  Date of Service: 03/17/2021 DOB: 1934-12-11  [id number], Quillian Quince, MD  Ladell Pier, MD     REFERRING PHYSICIAN: Ladell Pier, MD   DIAGNOSIS: The primary encounter diagnosis was Lung cancer, primary, with metastasis from lung to other site, left [facility]). A diagnosis of Secondary malignant neoplasm of bone [facility]) was also pertinent to this visit.   HISTORY OF PRESENT ILLNESS: [name] is a 86 y.o. female seen at the request of Dr. Benay Spice with a history of stage IV, NSCLC of the left lung. 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 She had bony disease at presentation and began systemic treatment with carboplatin Alimta and pembrolizumab which was begun on 06/06/2019, she has since been on single agent pembrolizumab after the completion of cycle 10 of chemotherapy.  Her most recent infusion was on 02/24/2021.  Recent imaging of the hip to evaluate the left hip and acetabular region showed degenerative changes without pathologic findings however she did have a PET scan in June 2022 that showed similar findings in the left acetabulum dating back to 2021, the SUV in this location was 4.2 and measured 7 mm on that 01/03/2021 PET scan.  She also had a MRI scan on 03/02/2021 at [facility], ordered by Dr. Ronnald Ramp that showed an enhancing left iliac bone lesion measuring 2.8 x 3.4 x 1 cm, which had been previously 3 x 0.8 x 2.8 cm in September 2020. In addition there was a 1 x 1.3 x 1.7 cm left acetabular lesion previously 1.2 x 1 x 1 cm in September 2020. given her symptoms of pain, she is seen today to discuss palliative radiotherapy to her left hip.    PREVIOUS RADIATION THERAPY: No   PAST MEDICAL HISTORY:  Past Medical History:  Diagnosis Date    Aneurysm ([place])    Right eye a non DES stent was placed so that she would not required long term dual antiplatelet therapy.   Anxiety    not currently taking any meds   Arthritis    CAD (coronary artery disease)    Diabetes mellitus without complication (HCC)    prediabetes per pt   H/O hiatal hernia    Heart murmur    History of stress test 03/31/2012   The post stress myocardial perfusion images show a normal pattern of perfusion in all region. The post left ventricles is normal in size. There is no scintigraphic of inductible myocardial ischemia. The post EF is 73   Hx of echocardiogram 11/29/2010   Ef 67% Normal size chambes, Aortic valve sclerosis without stenosis, No other significant valvular abnormalities, No percardial effusion.   Hyperlipemia    Hypertension    Insulin resistance    Lung cancer (HCC)    Multiple thyroid nodules    Neuromuscular disorder (HCC)    nerve pain after shingles   Osteoporosis    Restless legs    Shingles        PAST SURGICAL HISTORY: Past Surgical History:  Procedure Laterality Date   ABDOMINAL HYSTERECTOMY  1970   ANGIOPLASTY     Stenting of a 90% eccentric right coronary artery stenosis and had a 3.0x15 mm Integrity bare-metal stent inserted   ANTERIOR CERVICAL DECOMP/DISCECTOMY FUSION N/A 07/30/2013  Procedure: ANTERIOR CERVICAL DECOMPRESSION/DISCECTOMY FUSION CERVICAL FIVE -SIX;  Surgeon: Eustace Moore, MD;  Location: [facility];  Service: Neurosurgery;  Laterality: N/A;   APPENDECTOMY     BREAST SURGERY Left    cysts removed from left breast (in her 20'2)   CARDIAC CATHETERIZATION     Showed a widely patent stent, she did have 60% ostial diagonal-1 stenosis, She also had mild luminal irregularities of her LAD.   COLONOSCOPY     EYE SURGERY Bilateral 2009   cateract surgery- bilateral   IR IMAGING GUIDED PORT INSERTION  06/04/2019   REFRACTIVE SURGERY Left 2011   TONSILLECTOMY       FAMILY HISTORY:  Family History  Problem  Relation Age of Onset   Alzheimer's disease Mother 20   Colon polyps Mother    Diabetes Mother    Heart attack Father 69   Cancer Maternal Grandmother 33   Breast cancer Maternal Grandmother    Diabetes Sister    Breast cancer Maternal Uncle    Breast cancer Maternal Aunt        x 2   Colon polyps Sister    Colon polyps Maternal Aunt    Colon cancer Neg Hx    Esophageal cancer Neg Hx    Rectal cancer Neg Hx    Stomach cancer Neg Hx      SOCIAL HISTORY:  reports that she has quit smoking. Her smoking use included cigarettes. She has a 66.00 pack-year smoking history. She has never used smokeless tobacco. She reports current alcohol use. She reports that she does not use drugs. The patient is married and lives in [place]. She's accompanied by her daughter.   ALLERGIES: Codeine   MEDICATIONS:  Current Outpatient Medications  Medication Sig Dispense Refill   acetaminophen (TYLENOL) 500 MG tablet Take 625 mg by mouth every 6 (six) hours as needed (pain). Per Pt 12/17/20     ALPRAZolam (XANAX) 0.5 MG tablet Take 1 tablet by mouth 2 (two) times daily as needed for anxiety.      carboxymethylcellulose (REFRESH PLUS) 0.5 % SOLN Apply 1 drop to eye 3 (three) times daily as needed.     gabapentin (NEURONTIN) 300 MG capsule Take 300 mg by mouth at bedtime.      HYDROcodone-acetaminophen (NORCO) 5-325 MG tablet Take 1-2 tablets by mouth every 6 (six) hours as needed for moderate pain. 30 tablet 0   isosorbide mononitrate (IMDUR) 30 MG 24 hr tablet Take 1 tablet (30 mg total) by mouth daily. 90 tablet 3   lisinopril (ZESTRIL) 10 MG tablet TAKE 1 TABLET BY MOUTH  DAILY 90 tablet 3   Magnesium 250 MG TABS Take 250 mg by mouth at bedtime.      metFORMIN (GLUCOPHAGE-XR) 500 MG 24 hr tablet Take 500 mg by mouth at bedtime.      metoprolol tartrate (LOPRESSOR) 25 MG tablet Take 25 mg by mouth at bedtime.      Multiple Vitamins-Minerals (CENTRUM SILVER PO) Take 1 tablet by mouth daily.      polyethylene glycol (MIRALAX / GLYCOLAX) packet Take 17 g by mouth every morning.      rOPINIRole (REQUIP) 1 MG tablet Take 1 mg by mouth 2 (two) times daily. In the evening     rosuvastatin (CRESTOR) 40 MG tablet Take 40 mg by mouth at bedtime.      traZODone (DESYREL) 100 MG tablet Take 100 mg by mouth at bedtime.      Vitamin D, Ergocalciferol, (DRISDOL)  50000 UNITS CAPS capsule Take 50,000 Units by mouth every 7 (seven) days.     zinc gluconate 50 MG tablet Take 50 mg by mouth daily.     COVID-19 mRNA vaccine, Pfizer, 30 MCG/0.3ML injection Inject into the muscle. 0.3 mL 0   lidocaine (LIDODERM) 5 % SMARTSIG:0-3 Patch(s) Topical Daily     lidocaine-prilocaine (EMLA) cream Apply 1 application topically as needed. (Patient not taking: No sig reported) 30 g 2   naproxen sodium (ALEVE) 220 MG tablet Take 440 mg by mouth 2 (two) times daily as needed (pain).     nitroGLYCERIN (NITROSTAT) 0.4 MG SL tablet Place 0.4 mg under the tongue every 5 (five) minutes as needed for chest pain.  (Patient not taking: No sig reported)     ondansetron (ZOFRAN) 8 MG tablet Take 1 tablet (8 mg total) by mouth every 8 (eight) hours as needed for nausea or vomiting. (Patient not taking: No sig reported) 30 tablet 1   No current facility-administered medications for this encounter.     REVIEW OF SYSTEMS: On review of systems, the patient reports that she is doing well overall but is having pain that she states is from the neck down.  She has a history of arthritis that predates her cancer diagnosis.  She describes her pain primarily in the left hip region and that it hurts when she steps and puts weight on her left foot.  She describes occasional pain in her right hip but states this is been ongoing for some time.  She also describes discomfort along the thoracic spine region at the level of her bra strap, she denies any loss of sensation, but this does have some radicular symptoms to some degree a stinging or burning  sensation mainly on the right side at the posterior trunk radiating to the anterior.  No other complaints of new pains however are otherwise noted.  She denies any chest pain shortness of breath fevers or chills.  No other complaints are verbalized.  PHYSICAL EXAM:  Wt Readings from Last 3 Encounters:  03/17/21 184 lb 12.8 oz (83.8 kg)  03/17/21 186 lb 6 oz (84.5 kg)  02/24/21 182 lb 4.8 oz (82.7 kg)   Temp Readings from Last 3 Encounters:  03/17/21 97.8 F (36.6 C) (Oral)  03/17/21 97.9 F (36.6 C) (Oral)  02/24/21 97.8 F (36.6 C) (Oral)   BP Readings from Last 3 Encounters:  03/17/21 (!) 158/69  03/17/21 (!) 144/60  02/24/21 (!) 165/71   Pulse Readings from Last 3 Encounters:  03/17/21 60  03/17/21 (!) 57  02/24/21 63   Pain Assessment Pain Score: 2  Pain Loc: Generalized/10  In general this is a well appearing caucasian female in no acute distress. She's alert and oriented x4 and appropriate throughout the examination. Cardiopulmonary assessment is negative for acute distress and she exhibits normal effort.     ECOG = 1  0 - Asymptomatic (Fully active, able to carry on all predisease activities without restriction)  1 - Symptomatic but completely ambulatory (Restricted in physically strenuous activity but ambulatory and able to carry out work of a light or sedentary nature. For example, light housework, office work)  2 - Symptomatic, <50% in bed during the day (Ambulatory and capable of all self care but unable to carry out any work activities. Up and about more than 50% of waking hours)  3 - Symptomatic, >50% in bed, but not bedbound (Capable of only limited self-care, confined to bed or chair 50%  or more of waking hours)  4 - Bedbound (Completely disabled. Cannot carry on any self-care. Totally confined to bed or chair)  5 - Death   Eustace Pen MM, Creech RH, Tormey DC, et al. [phone]). "Toxicity and response criteria of the [facility]". [place] Oncol. 5 (6): 649-55    LABORATORY DATA:  Lab Results  Component Value Date   WBC 5.3 03/17/2021   HGB 11.3 (L) 03/17/2021   HCT 35.4 (L) 03/17/2021   MCV 94.7 03/17/2021   PLT 156 03/17/2021   Lab Results  Component Value Date   NA 138 03/17/2021   K 4.5 03/17/2021   CL 104 03/17/2021   CO2 25 03/17/2021   Lab Results  Component Value Date   ALT 17 03/17/2021   AST 19 03/17/2021   ALKPHOS 67 03/17/2021   BILITOT 0.4 03/17/2021      RADIOGRAPHY: DG HIP UNILAT WITH PELVIS 2-3 VIEWS LEFT  Result Date: 02/27/2021 CLINICAL DATA:  Metastatic lung cancer, increased LEFT hip pain, lesion LEFT acetabulum on PET EXAM: DG HIP (WITH OR WITHOUT PELVIS) 2-3V LEFT COMPARISON:  07/26/2020 Correlation: PET-CT 01/03/2021 FINDINGS: Diffuse osseous demineralization. Slight narrowing of the hip joints bilaterally. SI joints grossly preserved. No acute fracture, dislocation, or bone destruction. Degenerative disc and facet disease changes at visualized lower lumbar spine. Specifically, no abnormalities are identified at the site of abnormal FDG accumulation on PET-CT. IMPRESSION: Osseous demineralization with degenerative changes of the visualized lower lumbar spine and mildly of the hip joints bilaterally. No radiographic abnormalities are seen at the LEFT hip region to correspond to the site of abnormal FDG localization on PET-CT, though lesion was subtly detected on the concurrent CT imaging; if this lesion requires follow-up imaging, consider CT. Electronically Signed   By: Lavonia Dana M.D.   On: 02/27/2021 14:41       IMPRESSION/PLAN: 1. 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 Dr. Lisbeth Renshaw discusses the patient's course and the utility of palliative radiotherapy to the left hip region including the acetabulum and left iliac bone.  He also personally reviewed her pet imaging and did not see any findings in the thoracic spine  corresponding to her disease.  We will follow this expectantly however and if there was a site for potential treatment we could revisit this in the future.  We discussed the risks, benefits, short, and long term effects of radiotherapy, as well as the palliative intent, and the patient is interested in proceeding. Dr. Lisbeth Renshaw discusses the delivery and logistics of radiotherapy and anticipates a course of 2 weeks of radiotherapy to the left hip including acetabulum and left iliac bone.. Written consent is obtained and placed in the chart, a copy was provided to the patient. She will simulate on 03/25/21. 2. Painful bone metastases. The patient's regimen includes Norco and also over-the-counter anti-inflammatories.  We will follow this expectantly as she tries not to take pain medication regularly.  We anticipate improvement from her treatment specific to the left hip region. 3. Pain along with thoracic spine.  We will follow the 6 subsequent visits and see how she is doing, and if there is an indication for additional radiotherapy in the future Dr. Lisbeth Renshaw discusses we could revisit options of palliative radiotherapy at that time.  The above documentation reflects my direct findings during this shared patient visit. Please see the separate note by Dr. Lisbeth Renshaw on this date for the remainder of the patient's  plan of care.    Carola Rhine, [facility]   **Disclaimer: This note was dictated with voice recognition software. Similar sounding words can inadvertently be transcribed and this note may contain transcription errors which may not have been corrected upon publication of note.**

## 2021-03-17 NOTE — Progress Notes (Signed)
[place] OFFICE PROGRESS NOTE   Diagnosis: Non-small cell lung cancer  INTERVAL HISTORY:   [name] returns as scheduled.  She has increased pain in the left pelvis.  An MRI of the l left hip on 03/02/2021 revealed enlargement of an enhancing lesion at the left iliac and a lesion at the left anterior acetabulum compared to an MRI from September 2020.  A lesion at the left upper sacrum is less conspicuous.  Stable lesion at the left superior pubic ramus.  No other metastases.  The pain is relieved with hydrocodone we prescribed last week.  She saw a radiation oncology earlier today and plans to begin a course of palliative radiation. She complains of tenderness at the right upper anterior neck.  She is also developed hoarseness. Objective:  Vital signs in last 24 hours:  Blood pressure (!) 158/69, pulse 60, temperature 97.8 F (36.6 C), temperature source Oral, resp. rate 18, height '5\' 5"'  (1.651 m), weight 184 lb 12.8 oz (83.8 kg), SpO2 96 %.    HEENT: No neck mass. Lymphatics: No cervical or submandibular nodes Resp: Lungs clear bilaterally Cardio: Regular rate and rhythm GI: No hepatosplenomegaly Vascular: No leg edema Musculoskeletal: Tenderness at the left lateral iliac and bilateral trochanter, pain with motion at the left hip Skin: No rash  Portacath/PICC-without erythema  Lab Results:  Lab Results  Component Value Date   WBC 5.3 03/17/2021   HGB 11.3 (L) 03/17/2021   HCT 35.4 (L) 03/17/2021   MCV 94.7 03/17/2021   PLT 156 03/17/2021   NEUTROABS 3.6 03/17/2021    CMP  Lab Results  Component Value Date   NA 138 03/17/2021   K 4.5 03/17/2021   CL 104 03/17/2021   CO2 25 03/17/2021   GLUCOSE 106 (H) 03/17/2021   BUN 21 03/17/2021   CREATININE 0.75 03/17/2021   CALCIUM 9.7 03/17/2021   PROT 6.6 03/17/2021   ALBUMIN 4.2 03/17/2021   AST 19 03/17/2021   ALT 17 03/17/2021   ALKPHOS 67 03/17/2021   BILITOT 0.4 03/17/2021   GFRNONAA >60  03/17/2021   GFRAA >60 04/21/2020    Imaging:  03/02/2021 MRI images reviewed with [name] and her daughter  Medications: I have reviewed the patient's current medications.   Assessment/Plan: Non-small cell lung cancer MRI lumbar spine 04/29/2019- enlarging marrow lesions involving the L1 vertebral body, upper left sacrum and right iliac bone MRI pelvis 04/29/2019- 3.5 cm left iliac bone lesion appears slightly larger; other similar appearing lesions present within the left superior pubic ramus, left superior abdomen acetabulum and upper left sacrum Kappa free light chains with mild elevation 05/12/2019  CTs 05/12/2019- left lower lobe pulmonary mass 3.3 x 3.2 cm; lytic process left iliac bone; spinal lesions; 1.1 cm low-density left kidney lesion; right thyroid enlargement with heterogeneous appearance with potential for multiple discrete lesions Biopsy left lower lobe lung mass 05/26/2019-poorly differentiated carcinoma; positive for cytokeratin 5/6, p63 and TTF-1, no EGFR, BRAF, ALK, ERBB2,ROS, or NTRK alteration Cycle 1 carboplatin/Alimta/pembrolizumab 06/06/2019 Cycle 2 carboplatin/Alimta/pembrolizumab 06/27/2019 Cycle 3 carboplatin/Alimta/pembrolizumab 07/18/2019 Cycle 4 carboplatin/Alimta/pembrolizumab 08/07/2019 CTs 08/27/2019-significant decrease in size of lobulated mass left lower lobe.  Unchanged appearance of subtle bone lesions. Cycle 5 Alimta/pembrolizumab 08/28/2019 Cycle 6 Alimta/pembrolizumab 09/18/2019 Cycle 7 Alimta/pembrolizumab 10/09/2019 Cycle 8 Alimta/pembrolizumab 10/30/2019 Cycle 9 Alimta/pembrolizumab 11/20/2019 CTs 12/09/2019-no evidence of disease progression, left lower lobe nodule slightly decreased in size, stable L1, left sacral, and left pubic ramus metastases Cycle 10 Alimta/pembrolizumab 12/11/2019 Cycle 11 pembrolizumab alone 01/02/2020 (  Alimta held due to edema, tenderness, erythema at the lower legs) Cycle 12 pembrolizumab 01/23/2020 Cycle 13 pembrolizumab  02/12/2020 Cycle 14 pembrolizumab 03/04/2020 CTs 03/18/2020-stable left lower lobe lesion, mild sclerosis at the superior endplate of L1 that was previously hypermetabolic, stable small left upper sacral lucent lesion, previous left superior pubic ramus lesion is occult on the CT, CT head negative for malignancy Cycle 15 pembrolizumab 03/25/2020 Cycle 16 pembrolizumab 04/21/2020 Cycle 17 pembrolizumab 05/17/2020 05/19/2020 bone scan-no definite abnormalities to suggest osseous metastases.  Areas of concern on prior PET-CT involving left iliac bone and L1 vertebral body showed no abnormalities on the current study Cycle 18 Pembrolizumab 06/10/2020 Cycle 19 Pembrolizumab 06/30/2020 CTs 07/16/2020-stable left lower lobe nodule, stable faint superior L1 vertebral lesion, no evidence of disease progression Cycle 20 pembrolizumab 07/21/2020 Cycle 21 Pembrolizumab 08/11/2020 Cycle 22 pembrolizumab 09/01/2020 Cycle 23 Pembrolizumab 09/22/2020 Cycle 24 pembrolizumab 10/13/2020 Cycle 25 Pembrolizumab 11/03/2020 Cycle 26 pembrolizumab 11/26/2020 CTs 12/15/2020- stable left lower lobe mass, stable sclerotic lesion at L2, no evidence of disease progression Cycle 27 pembrolizumab 12/17/2020 PET scan 01/03/2021-1.8 x 1.3 cm left lower lobe nodule similar in size to CT of 12/15/2020 and measures smaller than previous PET/CT from 2020.  Nodule is markedly hypermetabolic.  No evidence for hypermetabolic hilar or mediastinal lymphadenopathy.  Several tiny foci of hypermetabolism identified in bony anatomy raising concern for skeletal metastases.  Comparison of the PET to CT 07/16/2020-lobular left lower lobe pulmonary nodule measured 2.4 x 1.5 cm on the prior study, current study it measured 1.8 x 1.3 cm.  Lesion in the anterior left acetabulum is similar to the 07/16/2020 exam although overlying cortical thinning slightly more pronounced on the current study.  Described lesion in the scapula shows some cortical sclerosis and a tiny  central marrow lucency not substantially changed compared to 07/16/2020.  Left third rib lesion shows heterogeneous mineralization similar to 07/16/2020. MRI of cervical spine 01/13/2021-increased left facet edema at C5-C6, severe facet arthrosis on the left at C7-T1 and on the right at C3-C4, no evidence of metastatic disease Cycle 28 Pembrolizumab 01/14/2021 Cycle 29 Pembrolizumab 02/03/2021 Cycle 30 Pembrolizumab 02/24/2021 MRI left hip 03/02/2021-compared to 04/29/2019, slight increase in size of metastases at the left iliac and left acetabulum.  Lesion at the left upper sacrum is less conspicuous and a lesion at the left superior pubic ramus is stable Cycle 31 pembrolizumab 03/17/2021 Pain secondary to #1, improved Chronic back pain Type 2 diabetes Essential hypertension CAD Hyperlipidemia Family history significant for multiple members with breast cancer Grade 1 skin rash 07/18/2019 likely related to immunotherapy.  Topical steroid cream as needed. E. coli urinary tract infection 07/14/2019.  Completed cephalexin. Edema/tenderness at the right greater than left ankle 10/21/2019-etiology unclear, potentially related to systemic therapy or an infection, doxycycline prescribed-improved 10/23/2019; marked improvement 11/20/2019; at office visit 01/02/2020 she reports worsening of lower extremity edema, pain/tenderness, erythema 3 to 4 days following each treatment.  Alimta held 01/02/2020.  Referral to dermatology. COVID-19 infection 03/30/2020, monoclonal antibody therapy 04/06/2020      Disposition: [name] appears unchanged.  She has developed increased pain at the left hip secondary to metastases involving the left iliac and acetabulum.  She saw Dr. Lisbeth Renshaw earlier today and will complete a course of palliative radiation.  No new lesions are seen on the MRI.  I recommend continuing pembrolizumab.  The etiology of the discomfort at the right anterior neck is unclear.  An MRI of the cervical spine  01/13/2021 revealed no evidence of  metastatic disease.  Hoarseness could be related to tumor progression in the mediastinum, but there is been no history of metastatic chest adenopathy.  [name] will complete another cycle of pembrolizumab today.  She will continue hydrocodone as needed for pain.  She will return for an office visit and pembrolizumab in 3 weeks.  [name], MD  03/17/2021  1:00 PM

## 2021-03-18 ENCOUNTER — Encounter: Payer: Self-pay | Admitting: General Practice

## 2021-03-18 ENCOUNTER — Ambulatory Visit: Payer: Medicare Other | Admitting: Radiation Oncology

## 2021-03-18 NOTE — Progress Notes (Signed)
[place] Psychosocial Distress Screening Clinical Social Work  Clinical Social Work was referred by distress screening protocol.  The patient scored a 5 on the Psychosocial Distress Thermometer which indicates moderate distress. Clinical Social Worker contacted patient by phone to assess for distress and other psychosocial needs. No concerns at this time, she is a bit anxious about upcoming radiation as she has not had this before.  She has support from husband and daughter, both of whom can help w transportation and other issues as needed.  Provided brief description of [place], encouraged her to call us as needed for support/resources, she has our contact information.    ON[facility]N DISTRESS SCREENING 03/17/2021  Screening Type Initial Screening  Distress experienced in past week (1-10) 5  Emotional problem type Nervousness/Anxiety  Information Concerns Type Lack of info about treatment  Physical Problem type Pain;Sleep/insomnia  Other Contact via phone [phone]    Clinical Social Worker follow up needed: No.  If yes, follow up plan:  Beverely Pace, [place], LCSW Clinical Social Worker Phone:  [phone]

## 2021-03-25 ENCOUNTER — Ambulatory Visit
Admission: RE | Admit: 2021-03-25 | Discharge: 2021-03-25 | Disposition: A | Discharge status: Home / Self Care | Payer: Medicare Other | Source: Ambulatory Visit | Attending: Radiation Oncology | Admitting: Radiation Oncology

## 2021-03-25 ENCOUNTER — Other Ambulatory Visit: Payer: Self-pay

## 2021-03-25 DIAGNOSIS — C7951 Secondary malignant neoplasm of bone: Secondary | ICD-10-CM | POA: Insufficient documentation

## 2021-03-25 DIAGNOSIS — Z510 Encounter for antineoplastic radiation therapy: Secondary | ICD-10-CM | POA: Diagnosis present

## 2021-03-25 DIAGNOSIS — C3432 Malignant neoplasm of lower lobe, left bronchus or lung: Secondary | ICD-10-CM | POA: Diagnosis present

## 2021-03-25 DIAGNOSIS — Z87891 Personal history of nicotine dependence: Secondary | ICD-10-CM | POA: Diagnosis not present

## 2021-03-29 DIAGNOSIS — Z510 Encounter for antineoplastic radiation therapy: Secondary | ICD-10-CM | POA: Diagnosis not present

## 2021-03-30 ENCOUNTER — Other Ambulatory Visit: Payer: Self-pay

## 2021-03-30 ENCOUNTER — Ambulatory Visit
Admission: RE | Admit: 2021-03-30 | Discharge: 2021-03-30 | Disposition: A | Discharge status: Home / Self Care | Payer: Medicare Other | Source: Ambulatory Visit | Attending: Radiation Oncology | Admitting: Radiation Oncology

## 2021-03-30 DIAGNOSIS — Z510 Encounter for antineoplastic radiation therapy: Secondary | ICD-10-CM | POA: Diagnosis not present

## 2021-03-31 ENCOUNTER — Ambulatory Visit
Admission: RE | Admit: 2021-03-31 | Discharge: 2021-03-31 | Disposition: A | Discharge status: Home / Self Care | Payer: Medicare Other | Source: Ambulatory Visit | Attending: Radiation Oncology | Admitting: Radiation Oncology

## 2021-03-31 DIAGNOSIS — C7951 Secondary malignant neoplasm of bone: Secondary | ICD-10-CM | POA: Insufficient documentation

## 2021-03-31 DIAGNOSIS — C3432 Malignant neoplasm of lower lobe, left bronchus or lung: Secondary | ICD-10-CM | POA: Insufficient documentation

## 2021-03-31 DIAGNOSIS — Z87891 Personal history of nicotine dependence: Secondary | ICD-10-CM | POA: Insufficient documentation

## 2021-03-31 DIAGNOSIS — Z510 Encounter for antineoplastic radiation therapy: Secondary | ICD-10-CM | POA: Insufficient documentation

## 2021-04-01 ENCOUNTER — Ambulatory Visit
Admission: RE | Admit: 2021-04-01 | Discharge: 2021-04-01 | Disposition: A | Discharge status: Home / Self Care | Payer: Medicare Other | Source: Ambulatory Visit | Attending: Radiation Oncology | Admitting: Radiation Oncology

## 2021-04-01 ENCOUNTER — Other Ambulatory Visit: Payer: Self-pay

## 2021-04-01 DIAGNOSIS — Z510 Encounter for antineoplastic radiation therapy: Secondary | ICD-10-CM | POA: Diagnosis not present

## 2021-04-04 ENCOUNTER — Other Ambulatory Visit: Payer: Self-pay | Admitting: Oncology

## 2021-04-05 ENCOUNTER — Other Ambulatory Visit: Payer: Self-pay

## 2021-04-05 ENCOUNTER — Ambulatory Visit
Admission: RE | Admit: 2021-04-05 | Discharge: 2021-04-05 | Disposition: A | Discharge status: Home / Self Care | Payer: Medicare Other | Source: Ambulatory Visit | Attending: Radiation Oncology | Admitting: Radiation Oncology

## 2021-04-05 DIAGNOSIS — Z510 Encounter for antineoplastic radiation therapy: Secondary | ICD-10-CM | POA: Diagnosis not present

## 2021-04-06 ENCOUNTER — Ambulatory Visit
Admission: RE | Admit: 2021-04-06 | Discharge: 2021-04-06 | Disposition: A | Discharge status: Home / Self Care | Payer: Medicare Other | Source: Ambulatory Visit | Attending: Radiation Oncology | Admitting: Radiation Oncology

## 2021-04-06 DIAGNOSIS — Z510 Encounter for antineoplastic radiation therapy: Secondary | ICD-10-CM | POA: Diagnosis not present

## 2021-04-07 ENCOUNTER — Ambulatory Visit
Admission: RE | Admit: 2021-04-07 | Discharge: 2021-04-07 | Disposition: A | Discharge status: Home / Self Care | Payer: Medicare Other | Source: Ambulatory Visit | Attending: Radiation Oncology | Admitting: Radiation Oncology

## 2021-04-07 ENCOUNTER — Inpatient Hospital Stay: Payer: Medicare Other | Attending: Oncology

## 2021-04-07 ENCOUNTER — Inpatient Hospital Stay (HOSPITAL_BASED_OUTPATIENT_CLINIC_OR_DEPARTMENT_OTHER): Payer: Medicare Other | Admitting: Oncology

## 2021-04-07 ENCOUNTER — Other Ambulatory Visit: Payer: Self-pay

## 2021-04-07 ENCOUNTER — Inpatient Hospital Stay: Payer: Medicare Other

## 2021-04-07 VITALS — BP 140/64 | HR 63 | Temp 98.1°F | Resp 18 | Ht 65.0 in | Wt 186.0 lb

## 2021-04-07 DIAGNOSIS — C3492 Malignant neoplasm of unspecified part of left bronchus or lung: Secondary | ICD-10-CM

## 2021-04-07 DIAGNOSIS — Z79899 Other long term (current) drug therapy: Secondary | ICD-10-CM | POA: Insufficient documentation

## 2021-04-07 DIAGNOSIS — C7951 Secondary malignant neoplasm of bone: Secondary | ICD-10-CM | POA: Insufficient documentation

## 2021-04-07 DIAGNOSIS — Z510 Encounter for antineoplastic radiation therapy: Secondary | ICD-10-CM | POA: Diagnosis not present

## 2021-04-07 DIAGNOSIS — Z5112 Encounter for antineoplastic immunotherapy: Secondary | ICD-10-CM | POA: Diagnosis present

## 2021-04-07 DIAGNOSIS — C3432 Malignant neoplasm of lower lobe, left bronchus or lung: Secondary | ICD-10-CM | POA: Insufficient documentation

## 2021-04-07 LAB — CBC WITH DIFFERENTIAL (CANCER CENTER ONLY)
Abs Immature Granulocytes: 0.01 10*3/uL (ref 0.00–0.07)
Basophils Absolute: 0 10*3/uL (ref 0.0–0.1)
Basophils Relative: 1 %
Eosinophils Absolute: 0.1 10*3/uL (ref 0.0–0.5)
Eosinophils Relative: 2 %
HCT: 35.4 % — ABNORMAL LOW (ref 36.0–46.0)
Hemoglobin: 11.4 g/dL — ABNORMAL LOW (ref 12.0–15.0)
Immature Granulocytes: 0 %
Lymphocytes Relative: 20 %
Lymphs Abs: 0.9 10*3/uL (ref 0.7–4.0)
MCH: 30.3 pg (ref 26.0–34.0)
MCHC: 32.2 g/dL (ref 30.0–36.0)
MCV: 94.1 fL (ref 80.0–100.0)
Monocytes Absolute: 0.3 10*3/uL (ref 0.1–1.0)
Monocytes Relative: 8 %
Neutro Abs: 3.1 10*3/uL (ref 1.7–7.7)
Neutrophils Relative %: 69 %
Platelet Count: 140 10*3/uL — ABNORMAL LOW (ref 150–400)
RBC: 3.76 MIL/uL — ABNORMAL LOW (ref 3.87–5.11)
RDW: 13.6 % (ref 11.5–15.5)
WBC Count: 4.4 10*3/uL (ref 4.0–10.5)
nRBC: 0 % (ref 0.0–0.2)

## 2021-04-07 LAB — CMP (CANCER CENTER ONLY)
ALT: 16 U/L (ref 0–44)
AST: 20 U/L (ref 15–41)
Albumin: 4.3 g/dL (ref 3.5–5.0)
Alkaline Phosphatase: 62 U/L (ref 38–126)
Anion gap: 9 (ref 5–15)
BUN: 22 mg/dL (ref 8–23)
CO2: 24 mmol/L (ref 22–32)
Calcium: 9.8 mg/dL (ref 8.9–10.3)
Chloride: 103 mmol/L (ref 98–111)
Creatinine: 0.89 mg/dL (ref 0.44–1.00)
GFR, Estimated: >60 mL/min (ref >60)
Glucose, Bld: 132 mg/dL — ABNORMAL HIGH (ref 70–99)
Potassium: 4.1 mmol/L (ref 3.5–5.1)
Sodium: 136 mmol/L (ref 135–145)
Total Bilirubin: 0.4 mg/dL (ref 0.3–1.2)
Total Protein: 6.7 g/dL (ref 6.5–8.1)

## 2021-04-07 LAB — TSH: TSH: 0.756 u[IU]/mL (ref 0.350–4.500)

## 2021-04-07 MED ORDER — SODIUM CHLORIDE 0.9% FLUSH
10.0000 mL | INTRAVENOUS | Status: DC | PRN
  Administered 2021-04-07: 10 mL

## 2021-04-07 MED ORDER — HEPARIN SOD (PORK) LOCK FLUSH 100 UNIT/ML IV SOLN
500.0000 [IU] | Freq: Once | INTRAVENOUS | Status: AC | PRN
  Administered 2021-04-07: 500 [IU]

## 2021-04-07 MED ORDER — SODIUM CHLORIDE 0.9 % IV SOLN: Freq: Once | INTRAVENOUS | Status: AC

## 2021-04-07 MED ORDER — SODIUM CHLORIDE 0.9 % IV SOLN
200.0000 mg | Freq: Once | INTRAVENOUS | Status: AC
  Administered 2021-04-07: 200 mg via INTRAVENOUS
  Filled 2021-04-07: qty 8

## 2021-04-07 NOTE — Progress Notes (Signed)
Patient presents for treatment. RN assessment completed along with the following:  Labs reviewed - Yes, and within treatment parameters Vitals reviewed including weight - Yes, and within treatment parameters Oncology Treatment Attestation completed for current therapy- Yes, on date 11/19/2019 Consent completed and reflects current therapy/intent - Yes, on date 06/06/2019 Provider progress note reviewed - Yes, today's progress note was reviewed. Treatment plan/orders reviewed - Yes, and there are no adjustments needed to today's treatment. Previous treatment date reviewed - Yes, and the appropriate amount of time has elapsed between treatments. Clinic Hand Off Received from - There was no handoff.  Patient to proceed with treatment.

## 2021-04-07 NOTE — Progress Notes (Signed)
[place] OFFICE PROGRESS NOTE   Diagnosis: Non-small cell lung cancer  INTERVAL HISTORY:   [name] returns as scheduled.  She completed another treatment with pembrolizumab on 03/17/2021.  No rash or diarrhea.  She continues to have pain at the left hip, though this has improved since starting radiation.  She takes approximately one half of a hydrocodone tablet twice daily.  She has pain in the mid upper back.  Objective:  Vital signs in last 24 hours:  Blood pressure 140/64, pulse 63, temperature 98.1 F (36.7 C), temperature source Oral, resp. rate 18, height '5\' 5"'  (1.651 m), weight 186 lb (84.4 kg), SpO2 96 %.   Resp: Lungs clear bilaterally Cardio: Regular rate and rhythm GI: No hepatosplenomegaly Vascular: No leg edema Musculoskeletal: Tender at the mid thoracic spine, no mass  Portacath/PICC-without erythema  Lab Results:  Lab Results  Component Value Date   WBC 4.4 04/07/2021   HGB 11.4 (L) 04/07/2021   HCT 35.4 (L) 04/07/2021   MCV 94.1 04/07/2021   PLT 140 (L) 04/07/2021   NEUTROABS 3.1 04/07/2021    CMP  Lab Results  Component Value Date   NA 138 03/17/2021   K 4.5 03/17/2021   CL 104 03/17/2021   CO2 25 03/17/2021   GLUCOSE 106 (H) 03/17/2021   BUN 21 03/17/2021   CREATININE 0.75 03/17/2021   CALCIUM 9.7 03/17/2021   PROT 6.6 03/17/2021   ALBUMIN 4.2 03/17/2021   AST 19 03/17/2021   ALT 17 03/17/2021   ALKPHOS 67 03/17/2021   BILITOT 0.4 03/17/2021   GFRNONAA >60 03/17/2021   GFRAA >60 04/21/2020     Medications: I have reviewed the patient's current medications.   Assessment/Plan: Non-small cell lung cancer MRI lumbar spine 04/29/2019- enlarging marrow lesions involving the L1 vertebral body, upper left sacrum and right iliac bone MRI pelvis 04/29/2019- 3.5 cm left iliac bone lesion appears slightly larger; other similar appearing lesions present within the left superior pubic ramus, left superior abdomen acetabulum  and upper left sacrum Kappa free light chains with mild elevation 05/12/2019  CTs 05/12/2019- left lower lobe pulmonary mass 3.3 x 3.2 cm; lytic process left iliac bone; spinal lesions; 1.1 cm low-density left kidney lesion; right thyroid enlargement with heterogeneous appearance with potential for multiple discrete lesions Biopsy left lower lobe lung mass 05/26/2019-poorly differentiated carcinoma; positive for cytokeratin 5/6, p63 and TTF-1, no EGFR, BRAF, ALK, ERBB2,ROS, or NTRK alteration Cycle 1 carboplatin/Alimta/pembrolizumab 06/06/2019 Cycle 2 carboplatin/Alimta/pembrolizumab 06/27/2019 Cycle 3 carboplatin/Alimta/pembrolizumab 07/18/2019 Cycle 4 carboplatin/Alimta/pembrolizumab 08/07/2019 CTs 08/27/2019-significant decrease in size of lobulated mass left lower lobe.  Unchanged appearance of subtle bone lesions. Cycle 5 Alimta/pembrolizumab 08/28/2019 Cycle 6 Alimta/pembrolizumab 09/18/2019 Cycle 7 Alimta/pembrolizumab 10/09/2019 Cycle 8 Alimta/pembrolizumab 10/30/2019 Cycle 9 Alimta/pembrolizumab 11/20/2019 CTs 12/09/2019-no evidence of disease progression, left lower lobe nodule slightly decreased in size, stable L1, left sacral, and left pubic ramus metastases Cycle 10 Alimta/pembrolizumab 12/11/2019 Cycle 11 pembrolizumab alone 01/02/2020 (Alimta held due to edema, tenderness, erythema at the lower legs) Cycle 12 pembrolizumab 01/23/2020 Cycle 13 pembrolizumab 02/12/2020 Cycle 14 pembrolizumab 03/04/2020 CTs 03/18/2020-stable left lower lobe lesion, mild sclerosis at the superior endplate of L1 that was previously hypermetabolic, stable small left upper sacral lucent lesion, previous left superior pubic ramus lesion is occult on the CT, CT head negative for malignancy Cycle 15 pembrolizumab 03/25/2020 Cycle 16 pembrolizumab 04/21/2020 Cycle 17 pembrolizumab 05/17/2020 05/19/2020 bone scan-no definite abnormalities to suggest osseous metastases.  Areas of concern on prior PET-CT involving left iliac  bone and L1 vertebral body showed no abnormalities on the current study Cycle 18 Pembrolizumab 06/10/2020 Cycle 19 Pembrolizumab 06/30/2020 CTs 07/16/2020-stable left lower lobe nodule, stable faint superior L1 vertebral lesion, no evidence of disease progression Cycle 20 pembrolizumab 07/21/2020 Cycle 21 Pembrolizumab 08/11/2020 Cycle 22 pembrolizumab 09/01/2020 Cycle 23 Pembrolizumab 09/22/2020 Cycle 24 pembrolizumab 10/13/2020 Cycle 25 Pembrolizumab 11/03/2020 Cycle 26 pembrolizumab 11/26/2020 CTs 12/15/2020- stable left lower lobe mass, stable sclerotic lesion at L2, no evidence of disease progression Cycle 27 pembrolizumab 12/17/2020 PET scan 01/03/2021-1.8 x 1.3 cm left lower lobe nodule similar in size to CT of 12/15/2020 and measures smaller than previous PET/CT from 2020.  Nodule is markedly hypermetabolic.  No evidence for hypermetabolic hilar or mediastinal lymphadenopathy.  Several tiny foci of hypermetabolism identified in bony anatomy raising concern for skeletal metastases.  Comparison of the PET to CT 07/16/2020-lobular left lower lobe pulmonary nodule measured 2.4 x 1.5 cm on the prior study, current study it measured 1.8 x 1.3 cm.  Lesion in the anterior left acetabulum is similar to the 07/16/2020 exam although overlying cortical thinning slightly more pronounced on the current study.  Described lesion in the scapula shows some cortical sclerosis and a tiny central marrow lucency not substantially changed compared to 07/16/2020.  Left third rib lesion shows heterogeneous mineralization similar to 07/16/2020. MRI of cervical spine 01/13/2021-increased left facet edema at C5-C6, severe facet arthrosis on the left at C7-T1 and on the right at C3-C4, no evidence of metastatic disease Cycle 28 Pembrolizumab 01/14/2021 Cycle 29 Pembrolizumab 02/03/2021 Cycle 30 Pembrolizumab 02/24/2021 MRI left hip 03/02/2021-compared to 04/29/2019, slight increase in size of metastases at the left iliac and left  acetabulum.  Lesion at the left upper sacrum is less conspicuous and a lesion at the left superior pubic ramus is stable Cycle 31 pembrolizumab 03/17/2021 Radiation to left acetabulum and left iliac 03/30/2021 Cycle 32 pembrolizumab 04/07/2021 Pain secondary to #1, improved Chronic back pain Type 2 diabetes Essential hypertension CAD Hyperlipidemia Family history significant for multiple members with breast cancer Grade 1 skin rash 07/18/2019 likely related to immunotherapy.  Topical steroid cream as needed. E. coli urinary tract infection 07/14/2019.  Completed cephalexin. Edema/tenderness at the right greater than left ankle 10/21/2019-etiology unclear, potentially related to systemic therapy or an infection, doxycycline prescribed-improved 10/23/2019; marked improvement 11/20/2019; at office visit 01/02/2020 she reports worsening of lower extremity edema, pain/tenderness, erythema 3 to 4 days following each treatment.  Alimta held 01/02/2020.  Referral to dermatology. COVID-19 infection 03/30/2020, monoclonal antibody therapy 04/06/2020       Disposition: [name] appears stable.  She is completing palliative radiation to the left pelvis.  She will complete radiation on 04/13/2021.  She will continue hydrocodone as needed for pain.  She has pain at the mid upper back.  She is scheduled for a thoracic MRI with Dr. Ronnald Ramp next month.  I reviewed the PET scan from June.  I do not see evidence of a thoracic metastasis.  [name] will return for an office visit and pembrolizumab in 3 weeks.  She will complete another treatment with pembrolizumab today.  [name], MD  04/07/2021  11:29 AM

## 2021-04-07 NOTE — Patient Instructions (Signed)
[facility]   Discharge Instructions: Thank you for choosing [facility] to provide your oncology and hematology care.   If you have a lab appointment with the [facility], please go directly to the [facility] and check in at the registration area.   Wear comfortable clothing and clothing appropriate for easy access to any Portacath or PICC line.   We strive to give you quality time with your provider. You may need to reschedule your appointment if you arrive late (15 or more minutes).  Arriving late affects you and other patients whose appointments are after yours.  Also, if you miss three or more appointments without notifying the office, you may be dismissed from the clinic at the provider's discretion.      For prescription refill requests, have your pharmacy contact our office and allow 72 hours for refills to be completed.    Today you received the following chemotherapy and/or immunotherapy agents Pembrolizumab (KEYTRUDA).      To help prevent nausea and vomiting after your treatment, we encourage you to take your nausea medication as directed.  BELOW ARE SYMPTOMS THAT SHOULD BE REPORTED IMMEDIATELY: *FEVER GREATER THAN 100.4 F (38 C) OR HIGHER *CHILLS OR SWEATING *NAUSEA AND VOMITING THAT IS NOT CONTROLLED WITH YOUR NAUSEA MEDICATION *UNUSUAL SHORTNESS OF BREATH *UNUSUAL BRUISING OR BLEEDING *URINARY PROBLEMS (pain or burning when urinating, or frequent urination) *BOWEL PROBLEMS (unusual diarrhea, constipation, pain near the anus) TENDERNESS IN MOUTH AND THROAT WITH OR WITHOUT PRESENCE OF ULCERS (sore throat, sores in mouth, or a toothache) UNUSUAL RASH, SWELLING OR PAIN  UNUSUAL VAGINAL DISCHARGE OR ITCHING   Items with * indicate a potential emergency and should be followed up as soon as possible or go to the Emergency Department if any problems should occur.  Please show the CHEMOTHERAPY ALERT CARD or IMMUNOTHERAPY ALERT CARD  at check-in to the Emergency Department and triage nurse.  Should you have questions after your visit or need to cancel or reschedule your appointment, please contact [facility]  Dept: [phone]  and follow the prompts.  Office hours are 8:00 a.m. to 4:30 p.m. Monday - Friday. Please note that voicemails left after 4:00 p.m. may not be returned until the following business day.  We are closed weekends and major holidays. You have access to a nurse at all times for urgent questions. Please call the main number to the clinic Dept: [phone] and follow the prompts.   For any non-urgent questions, you may also contact your provider using MyChart. We now offer e-Visits for anyone 38 and older to request care online for non-urgent symptoms. For details visit [email or web address].   Also download the MyChart app! Go to the app store, search "MyChart", open the app, select [facility], and log in with your MyChart username and password.  Due to Covid, a mask is required upon entering the hospital/clinic. If you do not have a mask, one will be given to you upon arrival. For doctor visits, patients may have 1 support person aged 51 or older with them. For treatment visits, patients cannot have anyone with them due to current Covid guidelines and our immunocompromised population.   Pembrolizumab injection What is this medication? PEMBROLIZUMAB (pem broe liz ue mab) is a monoclonal antibody. It is used to treat certain types of cancer. This medicine may be used for other purposes; ask your health care provider or pharmacist if you have questions. COMMON BRAND NAME(S):  Keytruda What should I tell my care team before I take this medication? They need to know if you have any of these conditions: autoimmune diseases like Crohn's disease, ulcerative colitis, or lupus have had or planning to have an allogeneic stem cell transplant (uses someone else's stem cells) history of  organ transplant history of chest radiation nervous system problems like myasthenia gravis or Guillain-Barre syndrome an unusual or allergic reaction to pembrolizumab, other medicines, foods, dyes, or preservatives pregnant or trying to get pregnant breast-feeding How should I use this medication? This medicine is for infusion into a vein. It is given by a health care professional in a hospital or clinic setting. A special MedGuide will be given to you before each treatment. Be sure to read this information carefully each time. Talk to your pediatrician regarding the use of this medicine in children. While this drug may be prescribed for children as young as 6 months for selected conditions, precautions do apply. Overdosage: If you think you have taken too much of this medicine contact a poison control center or emergency room at once. NOTE: This medicine is only for you. Do not share this medicine with others. What if I miss a dose? It is important not to miss your dose. Call your doctor or health care professional if you are unable to keep an appointment. What may interact with this medication? Interactions have not been studied. This list may not describe all possible interactions. Give your health care provider a list of all the medicines, herbs, non-prescription drugs, or dietary supplements you use. Also tell them if you smoke, drink alcohol, or use illegal drugs. Some items may interact with your medicine. What should I watch for while using this medication? Your condition will be monitored carefully while you are receiving this medicine. You may need blood work done while you are taking this medicine. Do not become pregnant while taking this medicine or for 4 months after stopping it. Women should inform their doctor if they wish to become pregnant or think they might be pregnant. There is a potential for serious side effects to an unborn child. Talk to your health care professional or  pharmacist for more information. Do not breast-feed an infant while taking this medicine or for 4 months after the last dose. What side effects may I notice from receiving this medication? Side effects that you should report to your doctor or health care professional as soon as possible: allergic reactions like skin rash, itching or hives, swelling of the face, lips, or tongue bloody or black, tarry breathing problems changes in vision chest pain chills confusion constipation cough diarrhea dizziness or feeling faint or lightheaded fast or irregular heartbeat fever flushing joint pain low blood counts - this medicine may decrease the number of white blood cells, red blood cells and platelets. You may be at increased risk for infections and bleeding. muscle pain muscle weakness pain, tingling, numbness in the hands or feet persistent headache redness, blistering, peeling or loosening of the skin, including inside the mouth signs and symptoms of high blood sugar such as dizziness; dry mouth; dry skin; fruity breath; nausea; stomach pain; increased hunger or thirst; increased urination signs and symptoms of kidney injury like trouble passing urine or change in the amount of urine signs and symptoms of liver injury like dark urine, light-colored stools, loss of appetite, nausea, right upper belly pain, yellowing of the eyes or skin sweating swollen lymph nodes weight loss Side effects that usually do not  require medical attention (report to your doctor or health care professional if they continue or are bothersome): decreased appetite hair loss tiredness This list may not describe all possible side effects. Call your doctor for medical advice about side effects. You may report side effects to FDA at 1-800-FDA-1088. Where should I keep my medication? This drug is given in a hospital or clinic and will not be stored at home. NOTE: This sheet is a summary. It may not cover all possible  information. If you have questions about this medicine, talk to your doctor, pharmacist, or health care provider.  2022 Elsevier/Gold Standard (2019-06-18 21:44:53)

## 2021-04-08 ENCOUNTER — Ambulatory Visit
Admission: RE | Admit: 2021-04-08 | Discharge: 2021-04-08 | Disposition: A | Discharge status: Home / Self Care | Payer: Medicare Other | Source: Ambulatory Visit | Attending: Radiation Oncology | Admitting: Radiation Oncology

## 2021-04-08 DIAGNOSIS — Z510 Encounter for antineoplastic radiation therapy: Secondary | ICD-10-CM | POA: Diagnosis not present

## 2021-04-11 ENCOUNTER — Other Ambulatory Visit: Payer: Self-pay

## 2021-04-11 ENCOUNTER — Ambulatory Visit
Admission: RE | Admit: 2021-04-11 | Discharge: 2021-04-11 | Disposition: A | Discharge status: Home / Self Care | Payer: Medicare Other | Source: Ambulatory Visit | Attending: Radiation Oncology | Admitting: Radiation Oncology

## 2021-04-11 DIAGNOSIS — Z510 Encounter for antineoplastic radiation therapy: Secondary | ICD-10-CM | POA: Diagnosis not present

## 2021-04-12 ENCOUNTER — Ambulatory Visit
Admission: RE | Admit: 2021-04-12 | Discharge: 2021-04-12 | Disposition: A | Discharge status: Home / Self Care | Payer: Medicare Other | Source: Ambulatory Visit | Attending: Radiation Oncology | Admitting: Radiation Oncology

## 2021-04-12 DIAGNOSIS — Z510 Encounter for antineoplastic radiation therapy: Secondary | ICD-10-CM | POA: Diagnosis not present

## 2021-04-13 ENCOUNTER — Ambulatory Visit
Admission: RE | Admit: 2021-04-13 | Discharge: 2021-04-13 | Disposition: A | Discharge status: Home / Self Care | Payer: Medicare Other | Source: Ambulatory Visit | Attending: Radiation Oncology | Admitting: Radiation Oncology

## 2021-04-13 ENCOUNTER — Other Ambulatory Visit: Payer: Self-pay

## 2021-04-13 ENCOUNTER — Encounter: Payer: Self-pay | Admitting: Radiation Oncology

## 2021-04-13 DIAGNOSIS — Z510 Encounter for antineoplastic radiation therapy: Secondary | ICD-10-CM | POA: Diagnosis not present

## 2021-04-16 ENCOUNTER — Encounter: Payer: Self-pay | Admitting: Oncology

## 2021-04-16 ENCOUNTER — Other Ambulatory Visit: Payer: Self-pay

## 2021-04-16 ENCOUNTER — Encounter (HOSPITAL_BASED_OUTPATIENT_CLINIC_OR_DEPARTMENT_OTHER): Payer: Self-pay | Admitting: Emergency Medicine

## 2021-04-16 ENCOUNTER — Emergency Department (HOSPITAL_BASED_OUTPATIENT_CLINIC_OR_DEPARTMENT_OTHER)
Admission: EM | Admit: 2021-04-16 | Discharge: 2021-04-16 | Disposition: A | Discharge status: Home / Self Care | Payer: Medicare Other | Attending: Emergency Medicine | Admitting: Emergency Medicine

## 2021-04-16 DIAGNOSIS — I2510 Atherosclerotic heart disease of native coronary artery without angina pectoris: Secondary | ICD-10-CM | POA: Insufficient documentation

## 2021-04-16 DIAGNOSIS — E119 Type 2 diabetes mellitus without complications: Secondary | ICD-10-CM | POA: Insufficient documentation

## 2021-04-16 DIAGNOSIS — I10 Essential (primary) hypertension: Secondary | ICD-10-CM | POA: Insufficient documentation

## 2021-04-16 DIAGNOSIS — Z79899 Other long term (current) drug therapy: Secondary | ICD-10-CM | POA: Diagnosis not present

## 2021-04-16 DIAGNOSIS — Z7984 Long term (current) use of oral hypoglycemic drugs: Secondary | ICD-10-CM | POA: Insufficient documentation

## 2021-04-16 DIAGNOSIS — U071 COVID-19: Secondary | ICD-10-CM | POA: Diagnosis present

## 2021-04-16 DIAGNOSIS — Z87891 Personal history of nicotine dependence: Secondary | ICD-10-CM | POA: Diagnosis not present

## 2021-04-16 DIAGNOSIS — Z85118 Personal history of other malignant neoplasm of bronchus and lung: Secondary | ICD-10-CM | POA: Diagnosis not present

## 2021-04-16 MED ORDER — NIRMATRELVIR/RITONAVIR (PAXLOVID)TABLET: 3.0000 | ORAL_TABLET | Freq: Two times a day (BID) | ORAL | 0 refills | Status: AC

## 2021-04-16 NOTE — ED Triage Notes (Signed)
Pt has stage iv lung cancer,had radiation on Wednesday, is on immunotherapy. Started having headache/chills/congestion on Thursday, denies fever. Pt tested positive today for covid and was scared with her history not to get checked out.

## 2021-04-16 NOTE — Discharge Instructions (Addendum)
While taking the Paxlovid, you should cut your trazodone dose in half and hold your amlodipine.  Once you finish the course of Paxlovid then you may resume your medications.  Please follow-up with either your primary doctor or oncologist early next week for virtual visit for close recheck.  Please come back to the ER if you develop difficulty breathing, vomiting, fever or other new concerning symptom.

## 2021-04-16 NOTE — ED Provider Notes (Signed)
[facility] EMERGENCY DEPT Provider Note   CSN: [id number] Arrival date & time: 04/16/21  1201     History Chief Complaint  Patient presents with   Covid Positive    [name] is a 86 y.o. female.  Presents to ER with concern for COVID.  Patient reports that she has stage IV lung cancer, immunotherapy and undergoing radiation.  On Thursday she started having some mild headache as well as chills and some nasal congestion.  Tested positive today for COVID-19 at home.  States that her symptoms are not worsening in any way today but due to her medical history wanted to be evaluated.  Denies any fever.  No difficulty in breathing.  No significant cough, no production.  No nausea or vomiting.  Has been eating and drinking without difficulty.  HPI     Past Medical History:  Diagnosis Date   Aneurysm ([place])    Right eye a non DES stent was placed so that she would not required long term dual antiplatelet therapy.   Anxiety    not currently taking any meds   Arthritis    CAD (coronary artery disease)    Diabetes mellitus without complication (HCC)    prediabetes per pt   H/O hiatal hernia    Heart murmur    History of stress test 03/31/2012   The post stress myocardial perfusion images show a normal pattern of perfusion in all region. The post left ventricles is normal in size. There is no scintigraphic of inductible myocardial ischemia. The post EF is 89   Hx of echocardiogram 11/29/2010   Ef 67% Normal size chambes, Aortic valve sclerosis without stenosis, No other significant valvular abnormalities, No percardial effusion.   Hyperlipemia    Hypertension    Insulin resistance    Lung cancer (HCC)    Multiple thyroid nodules    Neuromuscular disorder ([place])    nerve pain after shingles   Osteoporosis    Restless legs    Shingles     Patient Active Problem List   Diagnosis Date Noted   Port-A-Cath in place 07/18/2019   Lung cancer, primary, with  metastasis from lung to other site, left ([place]) 05/29/2019   Pain in joint of right hip 04/18/2018   Anxiety 06/30/2016   Insomnia 06/30/2016   Nasolacrimal duct obstruction, acquired 02/21/2016   Type 2 diabetes mellitus ([facility] [place]) 12/18/2015   S/P cervical spinal fusion 07/30/2013   CAD (coronary artery disease) 06/03/2013   Restless leg syndrome 06/03/2013   Essential hypertension 06/03/2013   Cardiac murmur 06/03/2013   Hyperlipidemia with target LDL less than 70 06/03/2013    Past Surgical History:  Procedure Laterality Date   ABDOMINAL HYSTERECTOMY  1970   ANGIOPLASTY     Stenting of a 90% eccentric right coronary artery stenosis and had a 3.0x15 mm Integrity bare-metal stent inserted   ANTERIOR CERVICAL DECOMP/DISCECTOMY FUSION N/A 07/30/2013   Procedure: ANTERIOR CERVICAL DECOMPRESSION/DISCECTOMY FUSION CERVICAL FIVE -SIX;  Surgeon: Eustace Moore, MD;  Location: [facility];  Service: Neurosurgery;  Laterality: N/A;   APPENDECTOMY     BREAST SURGERY Left    cysts removed from left breast (in her 20'2)   CARDIAC CATHETERIZATION     Showed a widely patent stent, she did have 60% ostial diagonal-1 stenosis, She also had mild luminal irregularities of her LAD.   COLONOSCOPY     EYE SURGERY Bilateral 2009   cateract surgery- bilateral   IR IMAGING GUIDED PORT INSERTION  06/04/2019   REFRACTIVE SURGERY Left 2011   TONSILLECTOMY       OB History   No obstetric history on file.     Family History  Problem Relation Age of Onset   Alzheimer's disease Mother 10   Colon polyps Mother    Diabetes Mother    Heart attack Father 46   Cancer Maternal Grandmother 45   Breast cancer Maternal Grandmother    Diabetes Sister    Breast cancer Maternal Uncle    Breast cancer Maternal Aunt        x 2   Colon polyps Sister    Colon polyps Maternal Aunt    Colon cancer Neg Hx    Esophageal cancer Neg Hx    Rectal cancer Neg Hx    Stomach cancer Neg Hx     Social History   Tobacco  Use   Smoking status: Former    Packs/day: 2.00    Years: 33.00    Pack years: 66.00    Types: Cigarettes   Smokeless tobacco: Never   Tobacco comments:    quit in 1995.  Vaping Use   Vaping Use: Never used  Substance Use Topics   Alcohol use: Yes    Alcohol/week: 0.0 standard drinks    Comment: occasional (maybe twice a year)   Drug use: No    Home Medications Prior to Admission medications   Medication Sig Start Date End Date Taking? Authorizing Provider  nirmatrelvir/ritonavir EUA (PAXLOVID) 20 x 150 MG & 10 x 100MG  TABS Take 3 tablets by mouth 2 (two) times daily for 5 days. Patient GFR is greater than 60. Take nirmatrelvir (150 mg) two tablets twice daily for 5 days and ritonavir (100 mg) one tablet twice daily for 5 days. 04/16/21 04/21/21 Yes Kaicen Desena, Ellwood Dense, MD  acetaminophen (TYLENOL) 500 MG tablet Take 625 mg by mouth every 6 (six) hours as needed (pain). Per Pt 12/17/20    [provider]  ALPRAZolam Duanne Moron) 0.5 MG tablet Take 1 tablet by mouth 2 (two) times daily as needed for anxiety.  09/11/13   [provider]  carboxymethylcellulose (REFRESH PLUS) 0.5 % SOLN Apply 1 drop to eye 3 (three) times daily as needed.    [provider]  COVID-19 mRNA vaccine, Pfizer, 30 MCG/0.3ML injection Inject into the muscle. 11/03/20   Carlyle Basques, MD  gabapentin (NEURONTIN) 300 MG capsule Take 300 mg by mouth at bedtime.     [provider]  HYDROcodone-acetaminophen (NORCO) 5-325 MG tablet Take 1-2 tablets by mouth every 6 (six) hours as needed for moderate pain. 03/11/21   Owens Shark, NP  isosorbide mononitrate (IMDUR) 30 MG 24 hr tablet Take 1 tablet (30 mg total) by mouth daily. 12/21/20 03/21/21  Troy Sine, MD  lidocaine (LIDODERM) 5 % SMARTSIG:0-3 Patch(s) Topical Daily 06/17/20   [provider]  lidocaine-prilocaine (EMLA) cream Apply 1 application topically as needed. 03/04/20   Owens Shark, NP  lisinopril (ZESTRIL) 10 MG  tablet TAKE 1 TABLET BY MOUTH  DAILY 06/25/20   Troy Sine, MD  Magnesium 250 MG TABS Take 250 mg by mouth at bedtime.     [provider]  metFORMIN (GLUCOPHAGE-XR) 500 MG 24 hr tablet Take 500 mg by mouth at bedtime.  02/20/15   [provider]  metoprolol tartrate (LOPRESSOR) 25 MG tablet Take 25 mg by mouth at bedtime.     [provider]  Multiple Vitamins-Minerals (CENTRUM SILVER PO) Take  1 tablet by mouth daily.    [provider]  naproxen sodium (ALEVE) 220 MG tablet Take 440 mg by mouth 2 (two) times daily as needed (pain).    [provider]  nitroGLYCERIN (NITROSTAT) 0.4 MG SL tablet Place 0.4 mg under the tongue every 5 (five) minutes as needed for chest pain.  Patient not taking: No sig reported    [provider]  ondansetron (ZOFRAN) 8 MG tablet Take 1 tablet (8 mg total) by mouth every 8 (eight) hours as needed for nausea or vomiting. Patient not taking: No sig reported 06/10/19   Owens Shark, NP  polyethylene glycol (MIRALAX / GLYCOLAX) packet Take 17 g by mouth every morning.     [provider]  rOPINIRole (REQUIP) 1 MG tablet Take 1 mg by mouth 2 (two) times daily. In the evening    [provider]  rosuvastatin (CRESTOR) 40 MG tablet Take 40 mg by mouth at bedtime.     [provider]  traZODone (DESYREL) 100 MG tablet Take 100 mg by mouth at bedtime.     [provider]  Vitamin D, Ergocalciferol, (DRISDOL) 50000 UNITS CAPS capsule Take 50,000 Units by mouth every 7 (seven) days.    [provider]  zinc gluconate 50 MG tablet Take 50 mg by mouth daily.    [provider]    Allergies    Codeine  Review of Systems   Review of Systems  Constitutional:  Positive for chills and fatigue. Negative for fever.  HENT:  Negative for ear pain and sore throat.   Eyes:  Negative for pain and visual disturbance.  Respiratory:  Negative for cough and shortness of  breath.   Cardiovascular:  Negative for chest pain and palpitations.  Gastrointestinal:  Negative for abdominal pain and vomiting.  Genitourinary:  Negative for dysuria and hematuria.  Musculoskeletal:  Negative for arthralgias and back pain.  Skin:  Negative for color change and rash.  Neurological:  Positive for headaches. Negative for seizures and syncope.  All other systems reviewed and are negative.  Physical Exam Updated Vital Signs BP 139/78 (BP Location: Right Arm)   Pulse 75   Temp 97.8 F (36.6 C) (Oral)   Resp 16   SpO2 96%   Physical Exam Vitals and nursing note reviewed.  Constitutional:      General: She is not in acute distress.    Appearance: She is well-developed.  HENT:     Head: Normocephalic and atraumatic.  Eyes:     Conjunctiva/sclera: Conjunctivae normal.  Cardiovascular:     Rate and Rhythm: Normal rate and regular rhythm.     Heart sounds: No murmur heard. Pulmonary:     Effort: Pulmonary effort is normal. No respiratory distress.     Breath sounds: Normal breath sounds.  Abdominal:     Palpations: Abdomen is soft.     Tenderness: There is no abdominal tenderness.  Musculoskeletal:     Cervical back: Neck supple.  Skin:    General: Skin is warm and dry.  Neurological:     General: No focal deficit present.     Mental Status: She is alert.  Psychiatric:        Mood and Affect: Mood normal.    ED Results / Procedures / Treatments   Labs (all labs ordered are listed, but only abnormal results are displayed) Labs Reviewed - No data to display  EKG None  Radiology No results found.  Procedures Procedures  Medications Ordered in ED Medications - No data to display  ED Course  I have reviewed the triage vital signs and the nursing notes.  Pertinent labs & imaging results that were available during my care of the patient were reviewed by me and considered in my medical decision making (see chart for details).    MDM  Rules/Calculators/A&P                           86 year old lady with history of non-small cell lung cancer, diabetes, hypertension, hyperlipidemia, CAD presenting to ER with concern for COVID positive status.  Her symptoms at present are relatively mild.  She appears well in no distress, lungs are clear.  No tachypnea, no hypoxia.  Eating and drinking without difficulty, moist mucous membranes.  At present, given her relatively minimal symptoms, believe patient is appropriate to continue outpatient management.  Given medical comorbidities, offered Rx for paxlovid.  Patient would like to pursue this therapy.  Discussed with pharmacy, recommended reducing trazodone dose by 50% and holding her amlodipine she is taking this medication.  Discussed with patient regarding need for temporary modification chronic medications.  Reviewed return precautions in detail with patient, she demonstrated her understanding.  Recommended getting virtual recheck with primary or oncology early this coming week.    After the discussed management above, the patient was determined to be safe for discharge.  The patient was in agreement with this plan and all questions regarding their care were answered.  ED return precautions were discussed and the patient will return to the ED with any significant worsening of condition.  Final Clinical Impression(s) / ED Diagnoses Final diagnoses:  COVID-19    Rx / DC Orders ED Discharge Orders          Ordered    nirmatrelvir/ritonavir EUA (PAXLOVID) 20 x 150 MG & 10 x 100MG  TABS  2 times daily        04/16/21 1349             Lucrezia Starch, MD 04/17/21 [phone]

## 2021-04-18 ENCOUNTER — Encounter: Payer: Self-pay | Admitting: Oncology

## 2021-04-18 NOTE — Progress Notes (Signed)
                                                                                                                                                             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 Her pain improved during treatment, but she did note fatigue.  Plan: The patient will receive a call in about one month from the radiation oncology department. She will continue follow up with Dr. Benay Spice as well.   ________________________________________________    Carola Rhine, [facility]

## 2021-04-22 ENCOUNTER — Other Ambulatory Visit: Payer: Self-pay

## 2021-04-22 DIAGNOSIS — C3492 Malignant neoplasm of unspecified part of left bronchus or lung: Secondary | ICD-10-CM

## 2021-04-22 NOTE — Progress Notes (Signed)
Lab orders entered per phlebotomist for 04/28/2021

## 2021-04-24 ENCOUNTER — Other Ambulatory Visit: Payer: Self-pay | Admitting: Oncology

## 2021-04-26 ENCOUNTER — Encounter: Payer: Self-pay | Admitting: Oncology

## 2021-04-27 ENCOUNTER — Telehealth: Payer: Self-pay | Admitting: *Deleted

## 2021-04-27 NOTE — Telephone Encounter (Signed)
Left VM for patient OK to come to her appointment tomorrow as long as she has no COVID symptoms or tests +. Must wear mask at all times and Mr. Weimann can not accompany her to appointment tomorrow.

## 2021-04-28 ENCOUNTER — Inpatient Hospital Stay: Payer: Medicare Other

## 2021-04-28 ENCOUNTER — Other Ambulatory Visit: Payer: Self-pay

## 2021-04-28 ENCOUNTER — Inpatient Hospital Stay (HOSPITAL_BASED_OUTPATIENT_CLINIC_OR_DEPARTMENT_OTHER): Payer: Medicare Other | Admitting: Nurse Practitioner

## 2021-04-28 ENCOUNTER — Encounter: Payer: Self-pay | Admitting: Nurse Practitioner

## 2021-04-28 VITALS — BP 139/62 | HR 58 | Temp 98.1°F | Resp 18 | Ht 65.0 in | Wt 185.6 lb

## 2021-04-28 DIAGNOSIS — C3492 Malignant neoplasm of unspecified part of left bronchus or lung: Secondary | ICD-10-CM

## 2021-04-28 DIAGNOSIS — Z5112 Encounter for antineoplastic immunotherapy: Secondary | ICD-10-CM | POA: Diagnosis not present

## 2021-04-28 LAB — CMP (CANCER CENTER ONLY)
ALT: 17 U/L (ref 0–44)
AST: 18 U/L (ref 15–41)
Albumin: 4.2 g/dL (ref 3.5–5.0)
Alkaline Phosphatase: 70 U/L (ref 38–126)
Anion gap: 8 (ref 5–15)
BUN: 26 mg/dL — ABNORMAL HIGH (ref 8–23)
CO2: 26 mmol/L (ref 22–32)
Calcium: 9.7 mg/dL (ref 8.9–10.3)
Chloride: 102 mmol/L (ref 98–111)
Creatinine: 0.84 mg/dL (ref 0.44–1.00)
GFR, Estimated: >60 mL/min (ref >60)
Glucose, Bld: 113 mg/dL — ABNORMAL HIGH (ref 70–99)
Potassium: 4.5 mmol/L (ref 3.5–5.1)
Sodium: 136 mmol/L (ref 135–145)
Total Bilirubin: 0.3 mg/dL (ref 0.3–1.2)
Total Protein: 6.3 g/dL — ABNORMAL LOW (ref 6.5–8.1)

## 2021-04-28 LAB — CBC WITH DIFFERENTIAL (CANCER CENTER ONLY)
Abs Immature Granulocytes: 0.01 10*3/uL (ref 0.00–0.07)
Basophils Absolute: 0 10*3/uL (ref 0.0–0.1)
Basophils Relative: 1 %
Eosinophils Absolute: 0.1 10*3/uL (ref 0.0–0.5)
Eosinophils Relative: 2 %
HCT: 33.9 % — ABNORMAL LOW (ref 36.0–46.0)
Hemoglobin: 10.9 g/dL — ABNORMAL LOW (ref 12.0–15.0)
Immature Granulocytes: 0 %
Lymphocytes Relative: 19 %
Lymphs Abs: 0.8 10*3/uL (ref 0.7–4.0)
MCH: 29.9 pg (ref 26.0–34.0)
MCHC: 32.2 g/dL (ref 30.0–36.0)
MCV: 93.1 fL (ref 80.0–100.0)
Monocytes Absolute: 0.4 10*3/uL (ref 0.1–1.0)
Monocytes Relative: 9 %
Neutro Abs: 3 10*3/uL (ref 1.7–7.7)
Neutrophils Relative %: 69 %
Platelet Count: 152 10*3/uL (ref 150–400)
RBC: 3.64 MIL/uL — ABNORMAL LOW (ref 3.87–5.11)
RDW: 13.2 % (ref 11.5–15.5)
WBC Count: 4.3 10*3/uL (ref 4.0–10.5)
nRBC: 0 % (ref 0.0–0.2)

## 2021-04-28 LAB — TSH: TSH: 0.971 u[IU]/mL (ref 0.350–4.500)

## 2021-04-28 MED ORDER — SODIUM CHLORIDE 0.9 % IV SOLN
200.0000 mg | Freq: Once | INTRAVENOUS | Status: AC
  Administered 2021-04-28: 200 mg via INTRAVENOUS
  Filled 2021-04-28: qty 8

## 2021-04-28 MED ORDER — SODIUM CHLORIDE 0.9 % IV SOLN: Freq: Once | INTRAVENOUS | Status: AC

## 2021-04-28 MED ORDER — HEPARIN SOD (PORK) LOCK FLUSH 100 UNIT/ML IV SOLN
500.0000 [IU] | Freq: Once | INTRAVENOUS | Status: AC | PRN
  Administered 2021-04-28: 500 [IU]

## 2021-04-28 MED ORDER — SODIUM CHLORIDE 0.9% FLUSH: 10.0000 mL | INTRAVENOUS | Status: DC | PRN

## 2021-04-28 NOTE — Progress Notes (Signed)
[facility] OFFICE PROGRESS NOTE   Diagnosis: Non-small cell lung cancer  INTERVAL HISTORY:   [name] returns as scheduled.  She completed another cycle of Pembrolizumab 04/07/2021.  No diarrhea or rash.  She continues to have left hip pain.  Objective:  Vital signs in last 24 hours:  Blood pressure 139/62, pulse (!) 58, temperature 98.1 F (36.7 C), temperature source Oral, resp. rate 18, height '5\' 5"'  (1.651 m), weight 185 lb 9.6 oz (84.2 kg), SpO2 96 %.    HEENT: No thrush or ulcers. Resp: Lungs clear bilaterally. Cardio: Regular rate and rhythm. GI: No hepatomegaly. Vascular: No leg edema. Skin: No rash. Port-A-Cath without erythema.   Lab Results:  Lab Results  Component Value Date   WBC 4.3 04/28/2021   HGB 10.9 (L) 04/28/2021   HCT 33.9 (L) 04/28/2021   MCV 93.1 04/28/2021   PLT 152 04/28/2021   NEUTROABS 3.0 04/28/2021    Imaging:  No results found.  Medications: I have reviewed the patient's current medications.  Assessment/Plan: Non-small cell lung cancer MRI lumbar spine 04/29/2019- enlarging marrow lesions involving the L1 vertebral body, upper left sacrum and right iliac bone MRI pelvis 04/29/2019- 3.5 cm left iliac bone lesion appears slightly larger; other similar appearing lesions present within the left superior pubic ramus, left superior abdomen acetabulum and upper left sacrum Kappa free light chains with mild elevation 05/12/2019  CTs 05/12/2019- left lower lobe pulmonary mass 3.3 x 3.2 cm; lytic process left iliac bone; spinal lesions; 1.1 cm low-density left kidney lesion; right thyroid enlargement with heterogeneous appearance with potential for multiple discrete lesions Biopsy left lower lobe lung mass 05/26/2019-poorly differentiated carcinoma; positive for cytokeratin 5/6, p63 and TTF-1, no EGFR, BRAF, ALK, ERBB2,ROS, or NTRK alteration Cycle 1 carboplatin/Alimta/pembrolizumab 06/06/2019 Cycle 2  carboplatin/Alimta/pembrolizumab 06/27/2019 Cycle 3 carboplatin/Alimta/pembrolizumab 07/18/2019 Cycle 4 carboplatin/Alimta/pembrolizumab 08/07/2019 CTs 08/27/2019-significant decrease in size of lobulated mass left lower lobe.  Unchanged appearance of subtle bone lesions. Cycle 5 Alimta/pembrolizumab 08/28/2019 Cycle 6 Alimta/pembrolizumab 09/18/2019 Cycle 7 Alimta/pembrolizumab 10/09/2019 Cycle 8 Alimta/pembrolizumab 10/30/2019 Cycle 9 Alimta/pembrolizumab 11/20/2019 CTs 12/09/2019-no evidence of disease progression, left lower lobe nodule slightly decreased in size, stable L1, left sacral, and left pubic ramus metastases Cycle 10 Alimta/pembrolizumab 12/11/2019 Cycle 11 pembrolizumab alone 01/02/2020 (Alimta held due to edema, tenderness, erythema at the lower legs) Cycle 12 pembrolizumab 01/23/2020 Cycle 13 pembrolizumab 02/12/2020 Cycle 14 pembrolizumab 03/04/2020 CTs 03/18/2020-stable left lower lobe lesion, mild sclerosis at the superior endplate of L1 that was previously hypermetabolic, stable small left upper sacral lucent lesion, previous left superior pubic ramus lesion is occult on the CT, CT head negative for malignancy Cycle 15 pembrolizumab 03/25/2020 Cycle 16 pembrolizumab 04/21/2020 Cycle 17 pembrolizumab 05/17/2020 05/19/2020 bone scan-no definite abnormalities to suggest osseous metastases.  Areas of concern on prior PET-CT involving left iliac bone and L1 vertebral body showed no abnormalities on the current study Cycle 18 Pembrolizumab 06/10/2020 Cycle 19 Pembrolizumab 06/30/2020 CTs 07/16/2020-stable left lower lobe nodule, stable faint superior L1 vertebral lesion, no evidence of disease progression Cycle 20 pembrolizumab 07/21/2020 Cycle 21 Pembrolizumab 08/11/2020 Cycle 22 pembrolizumab 09/01/2020 Cycle 23 Pembrolizumab 09/22/2020 Cycle 24 pembrolizumab 10/13/2020 Cycle 25 Pembrolizumab 11/03/2020 Cycle 26 pembrolizumab 11/26/2020 CTs 12/15/2020- stable left lower lobe mass, stable sclerotic  lesion at L2, no evidence of disease progression Cycle 27 pembrolizumab 12/17/2020 PET scan 01/03/2021-1.8 x 1.3 cm left lower lobe nodule similar in size to CT of 12/15/2020 and measures smaller than previous PET/CT from 2020.  Nodule is markedly  hypermetabolic.  No evidence for hypermetabolic hilar or mediastinal lymphadenopathy.  Several tiny foci of hypermetabolism identified in bony anatomy raising concern for skeletal metastases.  Comparison of the PET to CT 07/16/2020-lobular left lower lobe pulmonary nodule measured 2.4 x 1.5 cm on the prior study, current study it measured 1.8 x 1.3 cm.  Lesion in the anterior left acetabulum is similar to the 07/16/2020 exam although overlying cortical thinning slightly more pronounced on the current study.  Described lesion in the scapula shows some cortical sclerosis and a tiny central marrow lucency not substantially changed compared to 07/16/2020.  Left third rib lesion shows heterogeneous mineralization similar to 07/16/2020. MRI of cervical spine 01/13/2021-increased left facet edema at C5-C6, severe facet arthrosis on the left at C7-T1 and on the right at C3-C4, no evidence of metastatic disease Cycle 28 Pembrolizumab 01/14/2021 Cycle 29 Pembrolizumab 02/03/2021 Cycle 30 Pembrolizumab 02/24/2021 MRI left hip 03/02/2021-compared to 04/29/2019, slight increase in size of metastases at the left iliac and left acetabulum.  Lesion at the left upper sacrum is less conspicuous and a lesion at the left superior pubic ramus is stable Cycle 31 pembrolizumab 03/17/2021 Radiation to left acetabulum and left iliac 03/30/2021-04/13/2021 Cycle 32 pembrolizumab 04/07/2021 Cycle 33 Pembrolizumab 04/28/2021 Pain secondary to #1, improved Chronic back pain Type 2 diabetes Essential hypertension CAD Hyperlipidemia Family history significant for multiple members with breast cancer Grade 1 skin rash 07/18/2019 likely related to immunotherapy.  Topical steroid cream as needed. E. coli  urinary tract infection 07/14/2019.  Completed cephalexin. Edema/tenderness at the right greater than left ankle 10/21/2019-etiology unclear, potentially related to systemic therapy or an infection, doxycycline prescribed-improved 10/23/2019; marked improvement 11/20/2019; at office visit 01/02/2020 she reports worsening of lower extremity edema, pain/tenderness, erythema 3 to 4 days following each treatment.  Alimta held 01/02/2020.  Referral to dermatology. COVID-19 infection 03/30/2020, monoclonal antibody therapy 04/06/2020      Disposition: [name] appears unchanged.  Plan to proceed with Pembrolizumab today as scheduled.  CBC and chemistry panel reviewed.  Labs adequate to proceed as above.  She continues to have left hip pain.  Hopefully she will notice some improvement over the next few weeks.  She finished the course of radiation approximately 2 weeks ago.  She has pain medication to take if needed.  She will return for lab, follow-up, Pembrolizumab in 3 weeks.  We are available to see her sooner if needed.      Ned Card ANP/GNP-BC   04/28/2021  9:47 AM

## 2021-04-28 NOTE — Patient Instructions (Signed)
[facility]  Discharge Instructions: Thank you for choosing [facility] to provide your oncology and hematology care.   If you have a lab appointment with the [facility], please go directly to the [facility] and check in at the registration area.   Wear comfortable clothing and clothing appropriate for easy access to any Portacath or PICC line.   We strive to give you quality time with your provider. You may need to reschedule your appointment if you arrive late (15 or more minutes).  Arriving late affects you and other patients whose appointments are after yours.  Also, if you miss three or more appointments without notifying the office, you may be dismissed from the clinic at the provider's discretion.      For prescription refill requests, have your pharmacy contact our office and allow 72 hours for refills to be completed.    Today you received the following chemotherapy and/or immunotherapy agents:: Keytruda Pembrolizumab injection What is this medication? PEMBROLIZUMAB (pem broe liz ue mab) is a monoclonal antibody. It is used to treat certain types of cancer. This medicine may be used for other purposes; ask your health care provider or pharmacist if you have questions. COMMON BRAND NAME(S): Keytruda What should I tell my care team before I take this medication? They need to know if you have any of these conditions: autoimmune diseases like Crohn's disease, ulcerative colitis, or lupus have had or planning to have an allogeneic stem cell transplant (uses someone else's stem cells) history of organ transplant history of chest radiation nervous system problems like myasthenia gravis or Guillain-Barre syndrome an unusual or allergic reaction to pembrolizumab, other medicines, foods, dyes, or preservatives pregnant or trying to get pregnant breast-feeding How should I use this medication? This medicine is for infusion into a vein. It is  given by a health care professional in a hospital or clinic setting. A special MedGuide will be given to you before each treatment. Be sure to read this information carefully each time. Talk to your pediatrician regarding the use of this medicine in children. While this drug may be prescribed for children as young as 6 months for selected conditions, precautions do apply. Overdosage: If you think you have taken too much of this medicine contact a poison control center or emergency room at once. NOTE: This medicine is only for you. Do not share this medicine with others. What if I miss a dose? It is important not to miss your dose. Call your doctor or health care professional if you are unable to keep an appointment. What may interact with this medication? Interactions have not been studied. This list may not describe all possible interactions. Give your health care provider a list of all the medicines, herbs, non-prescription drugs, or dietary supplements you use. Also tell them if you smoke, drink alcohol, or use illegal drugs. Some items may interact with your medicine. What should I watch for while using this medication? Your condition will be monitored carefully while you are receiving this medicine. You may need blood work done while you are taking this medicine. Do not become pregnant while taking this medicine or for 4 months after stopping it. Women should inform their doctor if they wish to become pregnant or think they might be pregnant. There is a potential for serious side effects to an unborn child. Talk to your health care professional or pharmacist for more information. Do not breast-feed an infant while taking this medicine  or for 4 months after the last dose. What side effects may I notice from receiving this medication? Side effects that you should report to your doctor or health care professional as soon as possible: allergic reactions like skin rash, itching or hives, swelling of  the face, lips, or tongue bloody or black, tarry breathing problems changes in vision chest pain chills confusion constipation cough diarrhea dizziness or feeling faint or lightheaded fast or irregular heartbeat fever flushing joint pain low blood counts - this medicine may decrease the number of white blood cells, red blood cells and platelets. You may be at increased risk for infections and bleeding. muscle pain muscle weakness pain, tingling, numbness in the hands or feet persistent headache redness, blistering, peeling or loosening of the skin, including inside the mouth signs and symptoms of high blood sugar such as dizziness; dry mouth; dry skin; fruity breath; nausea; stomach pain; increased hunger or thirst; increased urination signs and symptoms of kidney injury like trouble passing urine or change in the amount of urine signs and symptoms of liver injury like dark urine, light-colored stools, loss of appetite, nausea, right upper belly pain, yellowing of the eyes or skin sweating swollen lymph nodes weight loss Side effects that usually do not require medical attention (report to your doctor or health care professional if they continue or are bothersome): decreased appetite hair loss tiredness This list may not describe all possible side effects. Call your doctor for medical advice about side effects. You may report side effects to FDA at 1-800-FDA-1088. Where should I keep my medication? This drug is given in a hospital or clinic and will not be stored at home. NOTE: This sheet is a summary. It may not cover all possible information. If you have questions about this medicine, talk to your doctor, pharmacist, or health care provider.  2022 Elsevier/Gold Standard (2019-06-18 21:44:53)      To help prevent nausea and vomiting after your treatment, we encourage you to take your nausea medication as directed.  BELOW ARE SYMPTOMS THAT SHOULD BE REPORTED  IMMEDIATELY: *FEVER GREATER THAN 100.4 F (38 C) OR HIGHER *CHILLS OR SWEATING *NAUSEA AND VOMITING THAT IS NOT CONTROLLED WITH YOUR NAUSEA MEDICATION *UNUSUAL SHORTNESS OF BREATH *UNUSUAL BRUISING OR BLEEDING *URINARY PROBLEMS (pain or burning when urinating, or frequent urination) *BOWEL PROBLEMS (unusual diarrhea, constipation, pain near the anus) TENDERNESS IN MOUTH AND THROAT WITH OR WITHOUT PRESENCE OF ULCERS (sore throat, sores in mouth, or a toothache) UNUSUAL RASH, SWELLING OR PAIN  UNUSUAL VAGINAL DISCHARGE OR ITCHING   Items with * indicate a potential emergency and should be followed up as soon as possible or go to the Emergency Department if any problems should occur.  Please show the CHEMOTHERAPY ALERT CARD or IMMUNOTHERAPY ALERT CARD at check-in to the Emergency Department and triage nurse.  Should you have questions after your visit or need to cancel or reschedule your appointment, please contact [facility]  Dept: [phone]  and follow the prompts.  Office hours are 8:00 a.m. to 4:30 p.m. Monday - Friday. Please note that voicemails left after 4:00 p.m. may not be returned until the following business day.  We are closed weekends and major holidays. You have access to a nurse at all times for urgent questions. Please call the main number to the clinic Dept: [phone] and follow the prompts.   For any non-urgent questions, you may also contact your provider using MyChart. We now offer e-Visits for anyone 75  and older to request care online for non-urgent symptoms. For details visit [email or web address].   Also download the MyChart app! Go to the app store, search "MyChart", open the app, select [facility], and log in with your MyChart username and password.  Due to Covid, a mask is required upon entering the hospital/clinic. If you do not have a mask, one will be given to you upon arrival. For doctor visits, patients may have 1 support  person aged 49 or older with them. For treatment visits, patients cannot have anyone with them due to current Covid guidelines and our immunocompromised population.

## 2021-04-28 NOTE — Progress Notes (Signed)
Patient presents for treatment. RN assessment completed along with the following:  Labs/vitals reviewed - Yes, and within treatment parameters.   Weight within 10% of previous measurement - Yes Oncology Treatment Attestation completed for current therapy- Yes, on date 11/19/19 Informed consent completed and reflects current therapy/intent - Yes, on date 06/11/2019             Provider progress note reviewed - Yes, today's provider note was reviewed. Treatment/Antibody/Supportive plan reviewed - Yes, and there are no adjustments needed for today's treatment. S&H and other orders reviewed - Yes, and there are no additional orders identified. Previous treatment date reviewed - Yes, and the appropriate amount of time has elapsed between treatments. Clinic Hand Off Received from - Ned Card NP  Patient to proceed with treatment.

## 2021-05-03 ENCOUNTER — Encounter: Payer: Self-pay | Admitting: Oncology

## 2021-05-05 ENCOUNTER — Telehealth: Payer: Self-pay

## 2021-05-05 ENCOUNTER — Other Ambulatory Visit (HOSPITAL_BASED_OUTPATIENT_CLINIC_OR_DEPARTMENT_OTHER): Payer: Self-pay

## 2021-05-05 MED ORDER — INFLUENZA VAC A&B SA ADJ QUAD 0.5 ML IM PRSY
PREFILLED_SYRINGE | INTRAMUSCULAR | 0 refills | Status: DC
  Filled 2021-05-05: qty 0.5, 1d supply, fill #0

## 2021-05-05 NOTE — Telephone Encounter (Signed)
   Name: [name]  DOB: [birth year]  MRN: [id number]   Primary Cardiologist: Shelva Majestic, MD  Chart reviewed as part of pre-operative protocol coverage. Patient was contacted 05/05/2021 in reference to pre-operative risk assessment for pending surgery as outlined below.  [name] was last seen on 11/2020 by Dr. Claiborne Billings, chart reviewed. Pertinent cardiac hx includes CAD s/p prior BMS to RCA in 2012 with residual diagonal stenosis treated medically. Last stress test in 2016 was normal. Last echo 09/2019 EF 60-65% with moderate asymmetric LVH of the basal-septal segment, grade 1 DD, mildly elevated PASP.   I spoke with patient to see how she was feeling. She admits to occasional "angina" episodes but has a difficult time describing them, since they actually have improved significantly ever since Dr. Claiborne Billings started Imdur at last visit and she has not had an episode in the last few months. She gets short-winded with exertion but states this is not new. She is not able to complete 4 METS due to her back issues. Given above symptoms/history and inability to complete 4 METS, will route to Dr. Claiborne Billings for input on whether OK to proceed with clearing.  Of note, Dr. Evette Georges last [facility] indicated plan to continue ASA 3x/week but the patient states she is no longer taking this - she self-discontinued this a while back but denies a particular reason why. Dr. Claiborne Billings, please advise on resuming aspirin as well, and how long to hold for surgery if restarting. Please route response to P [facility] PREOP (the pre-op pool). Thank you.  Charlie Pitter, PA-C 05/05/2021, 2:21 PM

## 2021-05-05 NOTE — Telephone Encounter (Signed)
   [facility] Pre-operative Risk Assessment    Patient Name: [name]  DOB: 1935-03-14 MRN: [id number]  HEARTCARE STAFF:  - IMPORTANT!!!!!! Under Visit Info/Reason for Call, type in Other and utilize the format Clearance MM/DD/YY or Clearance TBD. Do not use dashes or single digits. - Please review there is not already an duplicate clearance open for this procedure. - If request is for dental extraction, please clarify the # of teeth to be extracted. - If the patient is currently at the dentist's office, call Pre-Op Callback Staff (MA/nurse) to input urgent request.  - If the patient is not currently in the dentist office, please route to the Pre-Op pool.  Request for surgical clearance:  What type of surgery is being performed? L4-S1 Lumbar Laminectomy w/L4-5 Posterior Lateral Fusion  When is this surgery scheduled? TBD  What type of clearance is required (medical clearance vs. Pharmacy clearance to hold med vs. Both)? Medical Clearance  Are there any medications that need to be held prior to surgery and how long? None listed   Practice name and name of physician performing surgery? [facility],   What is the office phone number? [phone]   7.   What is the office fax number? [phone]  8.   Anesthesia type (None, local, MAC, general) ? General    Jacqulynn Cadet 05/05/2021, 1:28 PM  _________________________________________________________________   (provider comments below)

## 2021-05-09 ENCOUNTER — Ambulatory Visit
Admission: RE | Admit: 2021-05-09 | Discharge: 2021-05-09 | Disposition: A | Discharge status: Home / Self Care | Payer: Medicare Other | Source: Ambulatory Visit | Attending: Nurse Practitioner | Admitting: Nurse Practitioner

## 2021-05-09 DIAGNOSIS — C3492 Malignant neoplasm of unspecified part of left bronchus or lung: Secondary | ICD-10-CM

## 2021-05-09 NOTE — Telephone Encounter (Signed)
I would advise patient be seen prior to giving definitive cardiac clearance since when I last saw her I added isosorbide to her medical regimen which I believe may have helped.

## 2021-05-09 NOTE — Progress Notes (Signed)
  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 During treatment she did very well with radiotherapy and did not have significant desquamation. Her pain improved and she did develop fatigue related to therapy. She reports she had an MRI of her spine on 9/29 at Dr. Ronnald Ramp' office at [facility] and was told she has progressive disease in her spine. Her images are not available in epic and I can see it was done in PACs but no report or images are viewable. She has been having pain in her back from her shoulder blades down. No loss of control or weakness is noted. She is due for her next Keytruda next week.  Impression/Plan: 1. 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 The patient has been doing well since completion of radiotherapy. We discussed that we would be happy to continue to follow her as needed, and will also follow up with her MRI results, but she will also continue to follow up with Dr. Benay Spice in medical oncology.  If additional radiation is recommended after we review her imaging, we will reach back out to her.      Carola Rhine, PAC

## 2021-05-10 NOTE — Telephone Encounter (Signed)
   Name: NANIE DUNKLEBERGER  DOB: 1935/04/05  MRN: [id number]  Primary Cardiologist: Shelva Majestic, MD  Chart reviewed as part of pre-operative protocol coverage. Because of ZIASIA LENOIR past medical history and time since last visit, she will require a follow-up visit in order to better assess preoperative cardiovascular risk.  Pre-op covering staff: - Please schedule appointment and call patient to inform them. If patient already had an upcoming appointment within acceptable timeframe, please add "pre-op clearance" to the appointment notes so provider is aware. - Please contact requesting surgeon's office via preferred method (i.e, phone, fax) to inform them of need for appointment prior to surgery.  If applicable, this message will also be routed to pharmacy pool and/or primary cardiologist for input on holding anticoagulant/antiplatelet agent as requested below so that this information is available to the clearing provider at time of patient's appointment.   Patient has follow up scheduled for 11/28, please make sure this surgery is not urgent and can wait until then. If can wait, I have updated the reason behind Nov follow up to include preop clearance (not just a 6 month follow up).  [place], [place]  05/10/2021, 11:35 AM

## 2021-05-10 NOTE — Telephone Encounter (Signed)
I tried to reach pt to offer maybe a sooner appt for her pre op clearance, see one of the APP's and keep the appt in Nov with Dr. Claiborne Billings if she chose too. No answer and no vm came on.

## 2021-05-11 ENCOUNTER — Telehealth: Payer: Self-pay | Admitting: Nurse Practitioner

## 2021-05-11 NOTE — Telephone Encounter (Signed)
I let [name] know Dr. Benay Spice has reviewed the thoracic MRI report-does not recommend intervention at this time.  She reports overall she is doing well, good and bad days.  She will follow-up as scheduled.

## 2021-05-12 ENCOUNTER — Encounter: Payer: Self-pay | Admitting: Oncology

## 2021-05-13 ENCOUNTER — Ambulatory Visit: Payer: Medicare Other | Admitting: Podiatry

## 2021-05-13 NOTE — Telephone Encounter (Signed)
Pt has appt with Dr. Claiborne Billings 06/27/21. Will send FYI to requesting office pt has appt.

## 2021-05-15 ENCOUNTER — Other Ambulatory Visit: Payer: Self-pay | Admitting: Oncology

## 2021-05-18 ENCOUNTER — Inpatient Hospital Stay: Payer: Medicare Other | Attending: Oncology

## 2021-05-18 ENCOUNTER — Inpatient Hospital Stay: Payer: Medicare Other

## 2021-05-18 ENCOUNTER — Other Ambulatory Visit: Payer: Self-pay

## 2021-05-18 ENCOUNTER — Telehealth: Payer: Self-pay

## 2021-05-18 ENCOUNTER — Inpatient Hospital Stay (HOSPITAL_BASED_OUTPATIENT_CLINIC_OR_DEPARTMENT_OTHER): Payer: Medicare Other | Admitting: Oncology

## 2021-05-18 VITALS — BP 143/63 | HR 63 | Temp 97.9°F | Resp 18 | Ht 65.0 in | Wt 185.6 lb

## 2021-05-18 DIAGNOSIS — C3490 Malignant neoplasm of unspecified part of unspecified bronchus or lung: Secondary | ICD-10-CM

## 2021-05-18 DIAGNOSIS — C3492 Malignant neoplasm of unspecified part of left bronchus or lung: Secondary | ICD-10-CM

## 2021-05-18 DIAGNOSIS — C7951 Secondary malignant neoplasm of bone: Secondary | ICD-10-CM | POA: Diagnosis present

## 2021-05-18 DIAGNOSIS — Z5112 Encounter for antineoplastic immunotherapy: Secondary | ICD-10-CM | POA: Insufficient documentation

## 2021-05-18 DIAGNOSIS — C3432 Malignant neoplasm of lower lobe, left bronchus or lung: Secondary | ICD-10-CM | POA: Diagnosis present

## 2021-05-18 LAB — CBC WITH DIFFERENTIAL (CANCER CENTER ONLY)
Abs Immature Granulocytes: 0.01 10*3/uL (ref 0.00–0.07)
Basophils Absolute: 0 10*3/uL (ref 0.0–0.1)
Basophils Relative: 0 %
Eosinophils Absolute: 0.1 10*3/uL (ref 0.0–0.5)
Eosinophils Relative: 1 %
HCT: 34.5 % — ABNORMAL LOW (ref 36.0–46.0)
Hemoglobin: 11.2 g/dL — ABNORMAL LOW (ref 12.0–15.0)
Immature Granulocytes: 0 %
Lymphocytes Relative: 18 %
Lymphs Abs: 0.8 10*3/uL (ref 0.7–4.0)
MCH: 30.4 pg (ref 26.0–34.0)
MCHC: 32.5 g/dL (ref 30.0–36.0)
MCV: 93.5 fL (ref 80.0–100.0)
Monocytes Absolute: 0.4 10*3/uL (ref 0.1–1.0)
Monocytes Relative: 11 %
Neutro Abs: 2.9 10*3/uL (ref 1.7–7.7)
Neutrophils Relative %: 70 %
Platelet Count: 146 10*3/uL — ABNORMAL LOW (ref 150–400)
RBC: 3.69 MIL/uL — ABNORMAL LOW (ref 3.87–5.11)
RDW: 13.6 % (ref 11.5–15.5)
WBC Count: 4.2 10*3/uL (ref 4.0–10.5)
nRBC: 0 % (ref 0.0–0.2)

## 2021-05-18 LAB — CMP (CANCER CENTER ONLY)
ALT: 13 U/L (ref 0–44)
AST: 17 U/L (ref 15–41)
Albumin: 4.3 g/dL (ref 3.5–5.0)
Alkaline Phosphatase: 64 U/L (ref 38–126)
Anion gap: 6 (ref 5–15)
BUN: 24 mg/dL — ABNORMAL HIGH (ref 8–23)
CO2: 25 mmol/L (ref 22–32)
Calcium: 9.6 mg/dL (ref 8.9–10.3)
Chloride: 102 mmol/L (ref 98–111)
Creatinine: 0.79 mg/dL (ref 0.44–1.00)
GFR, Estimated: >60 mL/min (ref >60)
Glucose, Bld: 105 mg/dL — ABNORMAL HIGH (ref 70–99)
Potassium: 4.7 mmol/L (ref 3.5–5.1)
Sodium: 133 mmol/L — ABNORMAL LOW (ref 135–145)
Total Bilirubin: 0.4 mg/dL (ref 0.3–1.2)
Total Protein: 6.7 g/dL (ref 6.5–8.1)

## 2021-05-18 MED ORDER — HEPARIN SOD (PORK) LOCK FLUSH 100 UNIT/ML IV SOLN
500.0000 [IU] | Freq: Once | INTRAVENOUS | Status: AC | PRN
  Administered 2021-05-18: 500 [IU]

## 2021-05-18 MED ORDER — SODIUM CHLORIDE 0.9 % IV SOLN
200.0000 mg | Freq: Once | INTRAVENOUS | Status: AC
  Administered 2021-05-18: 200 mg via INTRAVENOUS
  Filled 2021-05-18: qty 8

## 2021-05-18 MED ORDER — LIDOCAINE-PRILOCAINE 2.5-2.5 % EX CREA
1.0000 | TOPICAL_CREAM | CUTANEOUS | 2 refills | Status: DC | PRN
Start: 2021-05-18 — End: 2021-07-07

## 2021-05-18 MED ORDER — SODIUM CHLORIDE 0.9% FLUSH
10.0000 mL | INTRAVENOUS | Status: DC | PRN
  Administered 2021-05-18: 10 mL

## 2021-05-18 MED ORDER — SODIUM CHLORIDE 0.9 % IV SOLN: Freq: Once | INTRAVENOUS | Status: AC

## 2021-05-18 NOTE — Progress Notes (Signed)
[facility] OFFICE PROGRESS NOTE   Diagnosis: Non-small cell lung cancer  INTERVAL HISTORY:   [name] returns as scheduled.  She completed another treatment with pembrolizumab on 05/18/2021.  She continues to have pain in the lower back.  She takes hydrocodone intermittently.  She reports a rash over the back.  She saw Dr. Ronnald Ramp to evaluate back pain and MRI revealed several metastases involving the spine.  She reports Dr. Ronnald Ramp recommends a surgical procedure to help alleviate her pain (I do not have Dr. Ronnald Ramp note available today).  Objective:  Vital signs in last 24 hours:  Blood pressure (!) 143/63, pulse 63, temperature 97.9 F (36.6 C), temperature source Oral, resp. rate 18, height 5' 5" (1.651 m), weight 185 lb 9.6 oz (84.2 kg), SpO2 96 %.    Resp: Lungs clear bilaterally Cardio: Regular rate and rhythm GI: No hepatosplenomegaly Vascular: No leg edema  Skin: Mild fading erythematous maculopapular rash at the upper back  Portacath/PICC-without erythema  Lab Results:  Lab Results  Component Value Date   WBC 4.2 05/18/2021   HGB 11.2 (L) 05/18/2021   HCT 34.5 (L) 05/18/2021   MCV 93.5 05/18/2021   PLT 146 (L) 05/18/2021   NEUTROABS 2.9 05/18/2021    CMP  Lab Results  Component Value Date   NA 133 (L) 05/18/2021   K 4.7 05/18/2021   CL 102 05/18/2021   CO2 25 05/18/2021   GLUCOSE 105 (H) 05/18/2021   BUN 24 (H) 05/18/2021   CREATININE 0.79 05/18/2021   CALCIUM 9.6 05/18/2021   PROT 6.7 05/18/2021   ALBUMIN 4.3 05/18/2021   AST 17 05/18/2021   ALT 13 05/18/2021   ALKPHOS 64 05/18/2021   BILITOT 0.4 05/18/2021   GFRNONAA >60 05/18/2021   GFRAA >60 04/21/2020    No results found for: CEA1, CEA, CAN199, CA125  Lab Results  Component Value Date   INR 1.0 06/04/2019   LABPROT 12.8 06/04/2019    Imaging:  No results found.  Medications: I have reviewed the patient's current medications.   Assessment/Plan: Non-small cell lung  cancer MRI lumbar spine 04/29/2019- enlarging marrow lesions involving the L1 vertebral body, upper left sacrum and right iliac bone MRI pelvis 04/29/2019- 3.5 cm left iliac bone lesion appears slightly larger; other similar appearing lesions present within the left superior pubic ramus, left superior abdomen acetabulum and upper left sacrum Kappa free light chains with mild elevation 05/12/2019  CTs 05/12/2019- left lower lobe pulmonary mass 3.3 x 3.2 cm; lytic process left iliac bone; spinal lesions; 1.1 cm low-density left kidney lesion; right thyroid enlargement with heterogeneous appearance with potential for multiple discrete lesions Biopsy left lower lobe lung mass 05/26/2019-poorly differentiated carcinoma; positive for cytokeratin 5/6, p63 and TTF-1, no EGFR, BRAF, ALK, ERBB2,ROS, or NTRK alteration Cycle 1 carboplatin/Alimta/pembrolizumab 06/06/2019 Cycle 2 carboplatin/Alimta/pembrolizumab 06/27/2019 Cycle 3 carboplatin/Alimta/pembrolizumab 07/18/2019 Cycle 4 carboplatin/Alimta/pembrolizumab 08/07/2019 CTs 08/27/2019-significant decrease in size of lobulated mass left lower lobe.  Unchanged appearance of subtle bone lesions. Cycle 5 Alimta/pembrolizumab 08/28/2019 Cycle 6 Alimta/pembrolizumab 09/18/2019 Cycle 7 Alimta/pembrolizumab 10/09/2019 Cycle 8 Alimta/pembrolizumab 10/30/2019 Cycle 9 Alimta/pembrolizumab 11/20/2019 CTs 12/09/2019-no evidence of disease progression, left lower lobe nodule slightly decreased in size, stable L1, left sacral, and left pubic ramus metastases Cycle 10 Alimta/pembrolizumab 12/11/2019 Cycle 11 pembrolizumab alone 01/02/2020 (Alimta held due to edema, tenderness, erythema at the lower legs) Cycle 12 pembrolizumab 01/23/2020 Cycle 13 pembrolizumab 02/12/2020 Cycle 14 pembrolizumab 03/04/2020 CTs 03/18/2020-stable left lower lobe lesion, mild sclerosis at the superior  endplate of L1 that was previously hypermetabolic, stable small left upper sacral lucent lesion, previous left  superior pubic ramus lesion is occult on the CT, CT head negative for malignancy Cycle 15 pembrolizumab 03/25/2020 Cycle 16 pembrolizumab 04/21/2020 Cycle 17 pembrolizumab 05/17/2020 05/19/2020 bone scan-no definite abnormalities to suggest osseous metastases.  Areas of concern on prior PET-CT involving left iliac bone and L1 vertebral body showed no abnormalities on the current study Cycle 18 Pembrolizumab 06/10/2020 Cycle 19 Pembrolizumab 06/30/2020 CTs 07/16/2020-stable left lower lobe nodule, stable faint superior L1 vertebral lesion, no evidence of disease progression Cycle 20 pembrolizumab 07/21/2020 Cycle 21 Pembrolizumab 08/11/2020 Cycle 22 pembrolizumab 09/01/2020 Cycle 23 Pembrolizumab 09/22/2020 Cycle 24 pembrolizumab 10/13/2020 Cycle 25 Pembrolizumab 11/03/2020 Cycle 26 pembrolizumab 11/26/2020 CTs 12/15/2020- stable left lower lobe mass, stable sclerotic lesion at L2, no evidence of disease progression Cycle 27 pembrolizumab 12/17/2020 PET scan 01/03/2021-1.8 x 1.3 cm left lower lobe nodule similar in size to CT of 12/15/2020 and measures smaller than previous PET/CT from 2020.  Nodule is markedly hypermetabolic.  No evidence for hypermetabolic hilar or mediastinal lymphadenopathy.  Several tiny foci of hypermetabolism identified in bony anatomy raising concern for skeletal metastases.  Comparison of the PET to CT 07/16/2020-lobular left lower lobe pulmonary nodule measured 2.4 x 1.5 cm on the prior study, current study it measured 1.8 x 1.3 cm.  Lesion in the anterior left acetabulum is similar to the 07/16/2020 exam although overlying cortical thinning slightly more pronounced on the current study.  Described lesion in the scapula shows some cortical sclerosis and a tiny central marrow lucency not substantially changed compared to 07/16/2020.  Left third rib lesion shows heterogeneous mineralization similar to 07/16/2020. MRI of cervical spine 01/13/2021-increased left facet edema at C5-C6, severe  facet arthrosis on the left at C7-T1 and on the right at C3-C4, no evidence of metastatic disease Cycle 28 Pembrolizumab 01/14/2021 Cycle 29 Pembrolizumab 02/03/2021 Cycle 30 Pembrolizumab 02/24/2021 MRI left hip 03/02/2021-compared to 04/29/2019, slight increase in size of metastases at the left iliac and left acetabulum.  Lesion at the left upper sacrum is less conspicuous and a lesion at the left superior pubic ramus is stable Cycle 31 pembrolizumab 03/17/2021 Radiation to left acetabulum and left iliac 03/30/2021-04/13/2021 Cycle 32 pembrolizumab 04/07/2021 Cycle 33 Pembrolizumab 04/28/2021 Cycle 34 pembrolizumab 05/18/2021 Pain secondary to #1, improved Chronic back pain Type 2 diabetes Essential hypertension CAD Hyperlipidemia Family history significant for multiple members with breast cancer Grade 1 skin rash 07/18/2019 likely related to immunotherapy.  Topical steroid cream as needed. E. coli urinary tract infection 07/14/2019.  Completed cephalexin. Edema/tenderness at the right greater than left ankle 10/21/2019-etiology unclear, potentially related to systemic therapy or an infection, doxycycline prescribed-improved 10/23/2019; marked improvement 11/20/2019; at office visit 01/02/2020 she reports worsening of lower extremity edema, pain/tenderness, erythema 3 to 4 days following each treatment.  Alimta held 01/02/2020.  Referral to dermatology. COVID-19 infection 03/30/2020, monoclonal antibody therapy 04/06/2020       Disposition: [name] appears unchanged.  She will complete another treat with pembrolizumab today.  We discussed the indication for continuing pembrolizumab versus a treatment break.  There is been no clear evidence of disease progression while on pembrolizumab.  Bone lesions were present at diagnosis.  It is likely the recently noted spinal lesions on MRI have been present for some time.  I will follow-up on the office note from Dr. Ronnald Ramp.  She will complete another treatment  with pembrolizumab today.  She will undergo a restaging PET scan  after this cycle.  [name] will return for an office visit in 3 weeks.  [name], MD  05/18/2021  11:46 AM

## 2021-05-18 NOTE — Progress Notes (Signed)
Patient presents for treatment. RN assessment completed along with the following:  Labs/vitals reviewed - Yes, and within treatment parameters.   Weight within 10% of previous measurement - Yes Oncology Treatment Attestation completed for current therapy- Yes, on date 11/19/2019 Informed consent completed and reflects current therapy/intent - Yes, on date 06/06/2019             Provider progress note reviewed - Today's provider note is not yet available. I reviewed the most recent oncology provider progress note in chart dated 04/28/2021. Treatment/Antibody/Supportive plan reviewed - Yes, and there are no adjustments needed for today's treatment. S&H and other orders reviewed - Yes, and there are no additional orders identified. Previous treatment date reviewed - Yes, and the appropriate amount of time has elapsed between treatments. Clinic Hand Off Received from - [facility] Nurse Note from [place], [place].  Patient to proceed with treatment.

## 2021-05-18 NOTE — Telephone Encounter (Signed)
Patient seen by Dr. Benay Spice today  Vitals are within treatment parameters.  Labs reviewed by Dr. Benay Spice and are not all within treatment parameters. But ok to treat  Per physician team, patient is ready for treatment and there are NO modifications to the treatment plan.

## 2021-05-19 ENCOUNTER — Encounter: Payer: Self-pay | Admitting: Oncology

## 2021-05-26 ENCOUNTER — Other Ambulatory Visit: Payer: Self-pay | Admitting: Cardiovascular Disease

## 2021-06-01 ENCOUNTER — Other Ambulatory Visit: Payer: Self-pay

## 2021-06-01 ENCOUNTER — Ambulatory Visit (HOSPITAL_COMMUNITY)
Admission: RE | Admit: 2021-06-01 | Discharge: 2021-06-01 | Disposition: A | Discharge status: Home / Self Care | Payer: Medicare Other | Source: Ambulatory Visit | Attending: Oncology | Admitting: Oncology

## 2021-06-01 DIAGNOSIS — I2510 Atherosclerotic heart disease of native coronary artery without angina pectoris: Secondary | ICD-10-CM | POA: Diagnosis not present

## 2021-06-01 DIAGNOSIS — C7951 Secondary malignant neoplasm of bone: Secondary | ICD-10-CM | POA: Diagnosis not present

## 2021-06-01 DIAGNOSIS — C3490 Malignant neoplasm of unspecified part of unspecified bronchus or lung: Secondary | ICD-10-CM | POA: Diagnosis present

## 2021-06-01 DIAGNOSIS — C3492 Malignant neoplasm of unspecified part of left bronchus or lung: Secondary | ICD-10-CM | POA: Diagnosis not present

## 2021-06-01 DIAGNOSIS — I700 Atherosclerosis of aorta: Secondary | ICD-10-CM | POA: Insufficient documentation

## 2021-06-01 LAB — GLUCOSE, CAPILLARY: Glucose-Capillary: 119 mg/dL — ABNORMAL HIGH (ref 70–99)

## 2021-06-01 MED ORDER — FLUDEOXYGLUCOSE F - 18 (FDG) INJECTION
9.2000 | Freq: Once | INTRAVENOUS | Status: AC | PRN
  Administered 2021-06-01: 9.23 via INTRAVENOUS

## 2021-06-03 ENCOUNTER — Encounter: Payer: Self-pay | Admitting: *Deleted

## 2021-06-05 ENCOUNTER — Other Ambulatory Visit: Payer: Self-pay | Admitting: Oncology

## 2021-06-09 ENCOUNTER — Other Ambulatory Visit: Payer: Self-pay

## 2021-06-09 DIAGNOSIS — C3492 Malignant neoplasm of unspecified part of left bronchus or lung: Secondary | ICD-10-CM

## 2021-06-10 ENCOUNTER — Ambulatory Visit: Payer: Medicare Other

## 2021-06-10 ENCOUNTER — Other Ambulatory Visit: Payer: Medicare Other

## 2021-06-10 ENCOUNTER — Encounter: Payer: Self-pay | Admitting: Nurse Practitioner

## 2021-06-10 ENCOUNTER — Ambulatory Visit: Payer: Medicare Other | Admitting: Nurse Practitioner

## 2021-06-10 ENCOUNTER — Inpatient Hospital Stay: Payer: Medicare Other

## 2021-06-10 ENCOUNTER — Inpatient Hospital Stay (HOSPITAL_BASED_OUTPATIENT_CLINIC_OR_DEPARTMENT_OTHER): Payer: Medicare Other | Admitting: Nurse Practitioner

## 2021-06-10 ENCOUNTER — Inpatient Hospital Stay: Payer: Medicare Other | Attending: Oncology

## 2021-06-10 ENCOUNTER — Other Ambulatory Visit: Payer: Self-pay

## 2021-06-10 VITALS — BP 153/61 | HR 64 | Temp 98.1°F | Resp 20 | Ht 65.0 in | Wt 186.0 lb

## 2021-06-10 DIAGNOSIS — Z5112 Encounter for antineoplastic immunotherapy: Secondary | ICD-10-CM | POA: Diagnosis present

## 2021-06-10 DIAGNOSIS — C3432 Malignant neoplasm of lower lobe, left bronchus or lung: Secondary | ICD-10-CM | POA: Insufficient documentation

## 2021-06-10 DIAGNOSIS — C7951 Secondary malignant neoplasm of bone: Secondary | ICD-10-CM | POA: Diagnosis present

## 2021-06-10 DIAGNOSIS — C3492 Malignant neoplasm of unspecified part of left bronchus or lung: Secondary | ICD-10-CM

## 2021-06-10 LAB — CMP (CANCER CENTER ONLY)
ALT: 18 U/L (ref 0–44)
AST: 23 U/L (ref 15–41)
Albumin: 4.3 g/dL (ref 3.5–5.0)
Alkaline Phosphatase: 61 U/L (ref 38–126)
Anion gap: 6 (ref 5–15)
BUN: 21 mg/dL (ref 8–23)
CO2: 26 mmol/L (ref 22–32)
Calcium: 9.7 mg/dL (ref 8.9–10.3)
Chloride: 104 mmol/L (ref 98–111)
Creatinine: 0.91 mg/dL (ref 0.44–1.00)
GFR, Estimated: >60 mL/min (ref >60)
Glucose, Bld: 107 mg/dL — ABNORMAL HIGH (ref 70–99)
Potassium: 4.5 mmol/L (ref 3.5–5.1)
Sodium: 136 mmol/L (ref 135–145)
Total Bilirubin: 0.4 mg/dL (ref 0.3–1.2)
Total Protein: 6.7 g/dL (ref 6.5–8.1)

## 2021-06-10 LAB — CBC WITH DIFFERENTIAL (CANCER CENTER ONLY)
Abs Immature Granulocytes: 0.01 10*3/uL (ref 0.00–0.07)
Basophils Absolute: 0 10*3/uL (ref 0.0–0.1)
Basophils Relative: 0 %
Eosinophils Absolute: 0 10*3/uL (ref 0.0–0.5)
Eosinophils Relative: 1 %
HCT: 35.7 % — ABNORMAL LOW (ref 36.0–46.0)
Hemoglobin: 11.2 g/dL — ABNORMAL LOW (ref 12.0–15.0)
Immature Granulocytes: 0 %
Lymphocytes Relative: 17 %
Lymphs Abs: 0.7 10*3/uL (ref 0.7–4.0)
MCH: 29.4 pg (ref 26.0–34.0)
MCHC: 31.4 g/dL (ref 30.0–36.0)
MCV: 93.7 fL (ref 80.0–100.0)
Monocytes Absolute: 0.5 10*3/uL (ref 0.1–1.0)
Monocytes Relative: 11 %
Neutro Abs: 3 10*3/uL (ref 1.7–7.7)
Neutrophils Relative %: 71 %
Platelet Count: 148 10*3/uL — ABNORMAL LOW (ref 150–400)
RBC: 3.81 MIL/uL — ABNORMAL LOW (ref 3.87–5.11)
RDW: 13.4 % (ref 11.5–15.5)
WBC Count: 4.3 10*3/uL (ref 4.0–10.5)
nRBC: 0 % (ref 0.0–0.2)

## 2021-06-10 MED ORDER — HEPARIN SOD (PORK) LOCK FLUSH 100 UNIT/ML IV SOLN
500.0000 [IU] | Freq: Once | INTRAVENOUS | Status: AC | PRN
  Administered 2021-06-10: 500 [IU]

## 2021-06-10 MED ORDER — SODIUM CHLORIDE 0.9 % IV SOLN
200.0000 mg | Freq: Once | INTRAVENOUS | Status: AC
  Administered 2021-06-10: 200 mg via INTRAVENOUS
  Filled 2021-06-10: qty 8

## 2021-06-10 MED ORDER — SODIUM CHLORIDE 0.9 % IV SOLN
Freq: Once | INTRAVENOUS | Status: AC
Start: 2021-06-10 — End: 2021-06-10

## 2021-06-10 NOTE — Progress Notes (Signed)
[facility] OFFICE PROGRESS NOTE   Diagnosis: Non-small cell lung cancer  INTERVAL HISTORY:   [name] returns as scheduled.  She completed another treatment with Pembrolizumab 05/18/2021.  No diarrhea.  She reports a rash with associated pruritus on her back for the past 6 weeks.  There has been no significant change.  She continues to have bilateral hip pain right greater than left.  She takes Vicodin as needed but notes significant constipation when she takes pain medicine.  She is currently taking MiraLAX and magnesium.  She went to a nail salon for a pedicure about 6 weeks ago.  Since then she has had significant pain involving the right great toe.  She has completed a course of antibiotic per PCP with no significant improvement.  She denies fever.  Objective:  Vital signs in last 24 hours:  Blood pressure (!) 153/61, pulse 64, temperature 98.1 F (36.7 C), temperature source Oral, resp. rate 20, height _0  (1.651 m), weight 186 lb (84.4 kg), SpO2 96 %.    HEENT: No thrush or ulcers. Resp: Lungs clear bilaterally. Cardio: Regular rate and rhythm. GI: Abdomen soft and nontender.  No hepatosplenomegaly. Vascular: Trace edema lower leg bilaterally. Neuro: Alert and oriented. Skin: Scratch marks at the left upper back.  There are a few macular type skin lesions scattered around the back.  No significant rash.  Right great toe is erythematous, markedly tender.  No drainage noted. Port-A-Cath without erythema.  Lab Results:  Lab Results  Component Value Date   WBC 4.3 06/10/2021   HGB 11.2 (L) 06/10/2021   HCT 35.7 (L) 06/10/2021   MCV 93.7 06/10/2021   PLT 148 (L) 06/10/2021   NEUTROABS 3.0 06/10/2021    Imaging:  No results found.  Medications: I have reviewed the patient's current medications.  Assessment/Plan: Non-small cell lung cancer MRI lumbar spine 04/29/2019- enlarging marrow lesions involving the L1 vertebral body, upper left sacrum and  right iliac bone MRI pelvis 04/29/2019- 3.5 cm left iliac bone lesion appears slightly larger; other similar appearing lesions present within the left superior pubic ramus, left superior abdomen acetabulum and upper left sacrum Kappa free light chains with mild elevation 05/12/2019  CTs 05/12/2019- left lower lobe pulmonary mass 3.3 x 3.2 cm; lytic process left iliac bone; spinal lesions; 1.1 cm low-density left kidney lesion; right thyroid enlargement with heterogeneous appearance with potential for multiple discrete lesions Biopsy left lower lobe lung mass 05/26/2019-poorly differentiated carcinoma; positive for cytokeratin 5/6, p63 and TTF-1, no EGFR, BRAF, ALK, ERBB2,ROS, or NTRK alteration Cycle 1 carboplatin/Alimta/pembrolizumab 06/06/2019 Cycle 2 carboplatin/Alimta/pembrolizumab 06/27/2019 Cycle 3 carboplatin/Alimta/pembrolizumab 07/18/2019 Cycle 4 carboplatin/Alimta/pembrolizumab 08/07/2019 CTs 08/27/2019-significant decrease in size of lobulated mass left lower lobe.  Unchanged appearance of subtle bone lesions. Cycle 5 Alimta/pembrolizumab 08/28/2019 Cycle 6 Alimta/pembrolizumab 09/18/2019 Cycle 7 Alimta/pembrolizumab 10/09/2019 Cycle 8 Alimta/pembrolizumab 10/30/2019 Cycle 9 Alimta/pembrolizumab 11/20/2019 CTs 12/09/2019-no evidence of disease progression, left lower lobe nodule slightly decreased in size, stable L1, left sacral, and left pubic ramus metastases Cycle 10 Alimta/pembrolizumab 12/11/2019 Cycle 11 pembrolizumab alone 01/02/2020 (Alimta held due to edema, tenderness, erythema at the lower legs) Cycle 12 pembrolizumab 01/23/2020 Cycle 13 pembrolizumab 02/12/2020 Cycle 14 pembrolizumab 03/04/2020 CTs 03/18/2020-stable left lower lobe lesion, mild sclerosis at the superior endplate of L1 that was previously hypermetabolic, stable small left upper sacral lucent lesion, previous left superior pubic ramus lesion is occult on the CT, CT head negative for malignancy Cycle 15 pembrolizumab  03/25/2020 Cycle 16 pembrolizumab 04/21/2020 Cycle 17  pembrolizumab 05/17/2020 05/19/2020 bone scan-no definite abnormalities to suggest osseous metastases.  Areas of concern on prior PET-CT involving left iliac bone and L1 vertebral body showed no abnormalities on the current study Cycle 18 Pembrolizumab 06/10/2020 Cycle 19 Pembrolizumab 06/30/2020 CTs 07/16/2020-stable left lower lobe nodule, stable faint superior L1 vertebral lesion, no evidence of disease progression Cycle 20 pembrolizumab 07/21/2020 Cycle 21 Pembrolizumab 08/11/2020 Cycle 22 pembrolizumab 09/01/2020 Cycle 23 Pembrolizumab 09/22/2020 Cycle 24 pembrolizumab 10/13/2020 Cycle 25 Pembrolizumab 11/03/2020 Cycle 26 pembrolizumab 11/26/2020 CTs 12/15/2020- stable left lower lobe mass, stable sclerotic lesion at L2, no evidence of disease progression Cycle 27 pembrolizumab 12/17/2020 PET scan 01/03/2021-1.8 x 1.3 cm left lower lobe nodule similar in size to CT of 12/15/2020 and measures smaller than previous PET/CT from 2020.  Nodule is markedly hypermetabolic.  No evidence for hypermetabolic hilar or mediastinal lymphadenopathy.  Several tiny foci of hypermetabolism identified in bony anatomy raising concern for skeletal metastases.  Comparison of the PET to CT 07/16/2020-lobular left lower lobe pulmonary nodule measured 2.4 x 1.5 cm on the prior study, current study it measured 1.8 x 1.3 cm.  Lesion in the anterior left acetabulum is similar to the 07/16/2020 exam although overlying cortical thinning slightly more pronounced on the current study.  Described lesion in the scapula shows some cortical sclerosis and a tiny central marrow lucency not substantially changed compared to 07/16/2020.  Left third rib lesion shows heterogeneous mineralization similar to 07/16/2020. MRI of cervical spine 01/13/2021-increased left facet edema at C5-C6, severe facet arthrosis on the left at C7-T1 and on the right at C3-C4, no evidence of metastatic disease Cycle 28  Pembrolizumab 01/14/2021 Cycle 29 Pembrolizumab 02/03/2021 Cycle 30 Pembrolizumab 02/24/2021 MRI left hip 03/02/2021-compared to 04/29/2019, slight increase in size of metastases at the left iliac and left acetabulum.  Lesion at the left upper sacrum is less conspicuous and a lesion at the left superior pubic ramus is stable Cycle 31 pembrolizumab 03/17/2021 Radiation to left acetabulum and left iliac 03/30/2021-04/13/2021 Cycle 32 pembrolizumab 04/07/2021 Cycle 33 Pembrolizumab 04/28/2021 Cycle 34 pembrolizumab 05/18/2021 PET scan 06/01/2021-no significant change in size or degree of FDG uptake associated with FDG avid left lower lobe lung nodule compatible with neoplasm.  Stable to improved appearance of multifocal FDG avid bone metastasis. Pain secondary to #1, improved Chronic back pain Type 2 diabetes Essential hypertension CAD Hyperlipidemia Family history significant for multiple members with breast cancer Grade 1 skin rash 07/18/2019 likely related to immunotherapy.  Topical steroid cream as needed. E. coli urinary tract infection 07/14/2019.  Completed cephalexin. Edema/tenderness at the right greater than left ankle 10/21/2019-etiology unclear, potentially related to systemic therapy or an infection, doxycycline prescribed-improved 10/23/2019; marked improvement 11/20/2019; at office visit 01/02/2020 she reports worsening of lower extremity edema, pain/tenderness, erythema 3 to 4 days following each treatment.  Alimta held 01/02/2020.  Referral to dermatology. COVID-19 infection 03/30/2020, monoclonal antibody therapy 04/06/2020      Disposition: [name] appears unchanged.  She is on active treatment with every 3-week Pembrolizumab.  Recent restaging PET scan was stable.  Dr. Benay Spice reviewed the results/images reviewed with her and her daughter at today's visit and recommends continuation of every 3-week Pembrolizumab.  She agrees with this plan.  With regard to the radicular right sided back  pain.  She will continue to follow-up with Dr. Ronnald Ramp.  She is considering surgery.  She will follow-up with podiatry for the persistent right great toe pain following a pedicure.  She will return for lab, follow-up, Pembrolizumab  in 3 weeks.  We are available to see her sooner if needed.  Patient seen with Dr. Benay Spice.    Ned Card ANP/GNP-BC   06/10/2021  8:45 AM  This was a shared visit with Ned Card.  We reviewed the PET findings and images with [name].  There is no PET scan evidence of disease progression.  There is a persistent hypermetabolic left lung mass and multiple stable to improved bone lesions.  There is no PET finding to explain the right leg pain.  She plans to follow-up with Dr. Ronnald Ramp to discuss surgery for treatment of the right leg pain.  I was present for greater than 50% of today's visit.  I performed medical decision making.  Tina Manson, MD

## 2021-06-10 NOTE — Progress Notes (Signed)
Patient seen by [name] NP today  Vitals are within treatment parameters.  Labs reviewed by [name] NP and are within treatment parameters.  Per physician team, patient is ready for treatment and there are NO modifications to the treatment plan.     

## 2021-06-10 NOTE — Progress Notes (Signed)
Patient presents for treatment. RN assessment completed along with the following:  Labs/vitals reviewed - Yes, and within treatment parameters.   Weight within 10% of previous measurement - Yes Oncology Treatment Attestation completed for current therapy- Yes, on date 05-29-2019 Informed consent completed and reflects current therapy/intent - Yes, on date 06-06-2019             Provider progress note reviewed - Last MD note reviewed. Today's note not yet available. Per pt, Dr. Benay Spice and Lattie Haw saw her and reviewed scan results and we will proceed with immunotherapy.  Supportive plan reviewed - Yes, and there are no adjustments needed for today's treatment. S&H and other orders reviewed - Yes, and there are no additional orders identified. Previous treatment date reviewed - Yes, and the appropriate amount of time has elapsed between treatments.  Patient to proceed with treatment.

## 2021-06-10 NOTE — Patient Instructions (Signed)
[facility]  Discharge Instructions: Thank you for choosing [facility] to provide your oncology and hematology care.   If you have a lab appointment with the [facility], please go directly to the [facility] and check in at the registration area.   Wear comfortable clothing and clothing appropriate for easy access to any Portacath or PICC line.   We strive to give you quality time with your provider. You may need to reschedule your appointment if you arrive late (15 or more minutes).  Arriving late affects you and other patients whose appointments are after yours.  Also, if you miss three or more appointments without notifying the office, you may be dismissed from the clinic at the provider's discretion.      For prescription refill requests, have your pharmacy contact our office and allow 72 hours for refills to be completed.    Today you received the following chemotherapy and/or immunotherapy agents Keytruda  Pembrolizumab injection What is this medication? PEMBROLIZUMAB (pem broe liz ue mab) is a monoclonal antibody. It is used to treat certain types of cancer. This medicine may be used for other purposes; ask your health care provider or pharmacist if you have questions. COMMON BRAND NAME(S): Keytruda What should I tell my care team before I take this medication? They need to know if you have any of these conditions: autoimmune diseases like Crohn's disease, ulcerative colitis, or lupus have had or planning to have an allogeneic stem cell transplant (uses someone else's stem cells) history of organ transplant history of chest radiation nervous system problems like myasthenia gravis or Guillain-Barre syndrome an unusual or allergic reaction to pembrolizumab, other medicines, foods, dyes, or preservatives pregnant or trying to get pregnant breast-feeding How should I use this medication? This medicine is for infusion into a vein. It is  given by a health care professional in a hospital or clinic setting. A special MedGuide will be given to you before each treatment. Be sure to read this information carefully each time. Talk to your pediatrician regarding the use of this medicine in children. While this drug may be prescribed for children as young as 6 months for selected conditions, precautions do apply. Overdosage: If you think you have taken too much of this medicine contact a poison control center or emergency room at once. NOTE: This medicine is only for you. Do not share this medicine with others. What if I miss a dose? It is important not to miss your dose. Call your doctor or health care professional if you are unable to keep an appointment. What may interact with this medication? Interactions have not been studied. This list may not describe all possible interactions. Give your health care provider a list of all the medicines, herbs, non-prescription drugs, or dietary supplements you use. Also tell them if you smoke, drink alcohol, or use illegal drugs. Some items may interact with your medicine. What should I watch for while using this medication? Your condition will be monitored carefully while you are receiving this medicine. You may need blood work done while you are taking this medicine. Do not become pregnant while taking this medicine or for 4 months after stopping it. Women should inform their doctor if they wish to become pregnant or think they might be pregnant. There is a potential for serious side effects to an unborn child. Talk to your health care professional or pharmacist for more information. Do not breast-feed an infant while taking this  medicine or for 4 months after the last dose. What side effects may I notice from receiving this medication? Side effects that you should report to your doctor or health care professional as soon as possible: allergic reactions like skin rash, itching or hives, swelling of  the face, lips, or tongue bloody or black, tarry breathing problems changes in vision chest pain chills confusion constipation cough diarrhea dizziness or feeling faint or lightheaded fast or irregular heartbeat fever flushing joint pain low blood counts - this medicine may decrease the number of white blood cells, red blood cells and platelets. You may be at increased risk for infections and bleeding. muscle pain muscle weakness pain, tingling, numbness in the hands or feet persistent headache redness, blistering, peeling or loosening of the skin, including inside the mouth signs and symptoms of high blood sugar such as dizziness; dry mouth; dry skin; fruity breath; nausea; stomach pain; increased hunger or thirst; increased urination signs and symptoms of kidney injury like trouble passing urine or change in the amount of urine signs and symptoms of liver injury like dark urine, light-colored stools, loss of appetite, nausea, right upper belly pain, yellowing of the eyes or skin sweating swollen lymph nodes weight loss Side effects that usually do not require medical attention (report to your doctor or health care professional if they continue or are bothersome): decreased appetite hair loss tiredness This list may not describe all possible side effects. Call your doctor for medical advice about side effects. You may report side effects to FDA at 1-800-FDA-1088. Where should I keep my medication? This drug is given in a hospital or clinic and will not be stored at home. NOTE: This sheet is a summary. It may not cover all possible information. If you have questions about this medicine, talk to your doctor, pharmacist, or health care provider.  2022 [employer]/Gold Standard (2021-04-05 00:00:00)  To help prevent nausea and vomiting after your treatment, we encourage you to take your nausea medication as directed.  BELOW ARE SYMPTOMS THAT SHOULD BE REPORTED IMMEDIATELY: *FEVER  GREATER THAN 100.4 F (38 C) OR HIGHER *CHILLS OR SWEATING *NAUSEA AND VOMITING THAT IS NOT CONTROLLED WITH YOUR NAUSEA MEDICATION *UNUSUAL SHORTNESS OF BREATH *UNUSUAL BRUISING OR BLEEDING *URINARY PROBLEMS (pain or burning when urinating, or frequent urination) *BOWEL PROBLEMS (unusual diarrhea, constipation, pain near the anus) TENDERNESS IN MOUTH AND THROAT WITH OR WITHOUT PRESENCE OF ULCERS (sore throat, sores in mouth, or a toothache) UNUSUAL RASH, SWELLING OR PAIN  UNUSUAL VAGINAL DISCHARGE OR ITCHING   Items with * indicate a potential emergency and should be followed up as soon as possible or go to the Emergency Department if any problems should occur.  Please show the CHEMOTHERAPY ALERT CARD or IMMUNOTHERAPY ALERT CARD at check-in to the Emergency Department and triage nurse.  Should you have questions after your visit or need to cancel or reschedule your appointment, please contact [facility]  Dept: [phone]  and follow the prompts.  Office hours are 8:00 a.m. to 4:30 p.m. Monday - Friday. Please note that voicemails left after 4:00 p.m. may not be returned until the following business day.  We are closed weekends and major holidays. You have access to a nurse at all times for urgent questions. Please call the main number to the clinic Dept: [phone] and follow the prompts.   For any non-urgent questions, you may also contact your provider using MyChart. We now offer e-Visits for anyone 82 and older to  request care online for non-urgent symptoms. For details visit [email or web address].   Also download the MyChart app! Go to the app store, search "MyChart", open the app, select [facility], and log in with your MyChart username and password.  Due to Covid, a mask is required upon entering the hospital/clinic. If you do not have a mask, one will be given to you upon arrival. For doctor visits, patients may have 1 support person aged 29 or older  with them. For treatment visits, patients cannot have anyone with them due to current Covid guidelines and our immunocompromised population.

## 2021-06-15 ENCOUNTER — Other Ambulatory Visit: Payer: Self-pay | Admitting: Neurological Surgery

## 2021-06-26 ENCOUNTER — Other Ambulatory Visit: Payer: Self-pay | Admitting: Oncology

## 2021-06-27 ENCOUNTER — Encounter: Payer: Self-pay | Admitting: Oncology

## 2021-06-27 ENCOUNTER — Encounter: Payer: Self-pay | Admitting: Cardiovascular Disease

## 2021-06-27 ENCOUNTER — Other Ambulatory Visit: Payer: Self-pay

## 2021-06-27 ENCOUNTER — Ambulatory Visit (INDEPENDENT_AMBULATORY_CARE_PROVIDER_SITE_OTHER): Payer: Medicare Other | Admitting: Cardiovascular Disease

## 2021-06-27 DIAGNOSIS — R600 Localized edema: Secondary | ICD-10-CM

## 2021-06-27 DIAGNOSIS — I25118 Atherosclerotic heart disease of native coronary artery with other forms of angina pectoris: Secondary | ICD-10-CM | POA: Diagnosis not present

## 2021-06-27 DIAGNOSIS — Z01818 Encounter for other preprocedural examination: Secondary | ICD-10-CM

## 2021-06-27 DIAGNOSIS — G2581 Restless legs syndrome: Secondary | ICD-10-CM

## 2021-06-27 DIAGNOSIS — I10 Essential (primary) hypertension: Secondary | ICD-10-CM

## 2021-06-27 DIAGNOSIS — C3492 Malignant neoplasm of unspecified part of left bronchus or lung: Secondary | ICD-10-CM | POA: Diagnosis not present

## 2021-06-27 DIAGNOSIS — I422 Other hypertrophic cardiomyopathy: Secondary | ICD-10-CM

## 2021-06-27 DIAGNOSIS — E785 Hyperlipidemia, unspecified: Secondary | ICD-10-CM

## 2021-06-27 MED ORDER — HYDROCHLOROTHIAZIDE 12.5 MG PO CAPS: 12.5000 mg | ORAL_CAPSULE | Freq: Every day | ORAL | 3 refills | Status: DC

## 2021-06-27 MED ORDER — NITROGLYCERIN 0.4 MG SL SUBL: 0.4000 mg | SUBLINGUAL_TABLET | SUBLINGUAL | 11 refills | Status: DC | PRN

## 2021-06-27 NOTE — Progress Notes (Signed)
Patient ID: [name], female   DOB: 02-24-1935, 86 y.o.   MRN: [id number]      HPI: [name] is a 86 y.o. female who presents to the office for a 6 month cardiology evaluation.  [name] has known CAD and in March 2012 underwent stenting of a 90% eccentric RCA stenosis with a 3.0x15 mm integrity bare-metal stent. She developed recurrent chest pain in July 2012 and repeat catheterization revealed widely patent stent. She did have 60% postural diagonal 1 stenosis and medical therapy was recommended. She had mild luminal irregularities of the LAD.  In 2012 an echo Doppler study did show normal systolic function with mild aortic sclerosis mild MR and mild TR. She has a history of hyperlipidemia, restless legs, as well as documented insulin resistance.  She developed a herniated disc in her neck and underwent surgery by Dr. Sherley [name] on November 30 1 2014. She tolerated surgery well from a cardiovascular standpoint.  She has mild nerve damage from an episode of shingles which is improved with Neurontin. She does have restless legs which she takes Sinemet 25/100, one quarter of a tablet 2 times per day. She does have hyperlipidemia. She does have hypertension. She denies presyncope or syncope.    When I saw her in 2016 she had noticed development of rare chest pain when she walks fast or walks up a steep incline.  She underwent a nuclear perfusion study in October 2016 which showed normal perfusion and function without scar or ischemia.    She underwent eye surgery in  August 2017 involving her lacrimal duct.  She did have significant nosebleed following this.  She underwent a follow-up echo Doppler study in December 2017 which showed normal ejection fraction at 55-60% with grade 1 diastolic dysfunction.  There was aortic valve sclerosis without stenosis.  She tells me that her metformin was discontinued this past year by Dr. Sharlett [name].  Laboratory had shown a hemoglobin  A1c of 6.1.  In May 2018 LDL cholesterol was 63 on rosuvastatin.  I last saw her in February 2019.  At that time her blood pressure was stable she has continued to be on lisinopril 5 mg daily, metoprolol 37.5 mg twice a day for hypertension.  She has peripheral neuropathy on gabapentin 300 mg daily.  She has restless legs, and also takes carbidopa levodopa 25/100 mg one quarter of a tablet twice a day with improvement in symptomatology.  She denied any recurrent anginal symptoms.  There is mild shortness of breath with activity.  She is active.  I encouraged at least 150 minutes/week of exercise.  I saw her in February 2020.  At that time felt fairly well from a cardiac standpoint.  She continues to experience some mild shortness of breath with activity without significant change.  She was walking her dog at least 20 minutes a day basis.  She has been documented to have diastolic dysfunction on echocardiography.  She was under  increased stress with her husband's illness who in addition to his bronchiectasis also has developed leukemia.  I saw her in February 2021.  Since her prior evaluation with me she unfortunately  was diagnosed with non-small cell lung CA, stage IV with metastatic bone lesion.  She is followed by Dr. Benay [name].  She completed 4 cycles of carboplatin/Alimta/pembrolizumab on August 07, 2019.  She had a remote history of smoking and had smoked 1 pack/day for 25 years and quit smoking in 1990, 31 years ago.  She  denied any chest pain, PND orthopnea.  During that evaluation, on exam she had an early peaking systolic murmur felt consistent with probable mild aortic stenosis.  She had not had an echo Doppler study since 2017 and I commended a follow-up echo Doppler assessment.  She underwent a follow-up echo Doppler study in September 26, 2019.  This showed an EF of 60 to 65%.  There was moderate asymmetric left ventricular hypertrophy of the basal septum without S.A.M. or LVOT obstruction.  There  was grade 1 diastolic dysfunction.  Estimated RV systolic pressure was 44.0 mm.  Aortic valve was tricuspid and there was no evidence for stenosis.  I saw her in November 2021.  She continues to be followed by oncology for her metastatic lung cancer and  completed 18 cycles of pembrolizumab.  She continues to have back and hip pain.  She also developed a COVID-19 infection on March 30, 2020 and received monoclonal antibody therapy on April 06, 2020.  She denies any chest pain, PND orthopnea.  She denies any presyncope or syncope.    I last saw her on Dec 21, 2020.  At that time a recent CT had shown some mets to her bone.  She she continues to follow with Dr. Benay [name] and had another cycle of Keytruda on November 26, 2020.  At times she admits to rare angina and has taken sublingual nitroglycerin with benefit rarely.  She denies any prolonged episodes of chest pain.  She continues to be on lisinopril 10 mg, metoprolol tartrate 25 mg twice a day and was taking baby aspirin 3 times per week.  She is on rosuvastatin 40 mg daily for hyperlipidemia.    Since I last saw her, she has had progressive difficulty with low back pain.  She also has undergone additional treatment with Keytruda followed by Dr. Benay [name] for her metastatic non-small cell lung CA, stage IV.  She tells me recent PET scan did show improvement in her lesions which are now smaller.  Due to progressive lower back discomfort, she is tentatively scheduled to undergo laminectomy and foramina ectomy at L4-5 and L5-S1 by Dr. Sherley [name] scheduled for July 18, 2021.  He denies any chest pain.  She had run out of her sublingual nitroglycerin and needs a renewal.  Presently she is on amlodipine 2.5 mg, isosorbide 30 mg daily, lisinopril 10 mg, metoprolol tartrate 25 mg at bedtime, in addition to rosuvastatin 40 mg for significant hyperlipidemia.  She is no longer on aspirin therapy.  She has noted some very mild lower extremity edema to her pretibial  are. She has not had any anginal symptomatology.  She is diabetic on metformin.  She presents for follow-up evaluation and preoperative clearance.  Past Medical History:  Diagnosis Date   Aneurysm ([place])    Right eye a non DES stent was placed so that she would not required long term dual antiplatelet therapy.   Anxiety    not currently taking any meds   Arthritis    CAD (coronary artery disease)    Diabetes mellitus without complication (HCC)    prediabetes per pt   H/O hiatal hernia    Heart murmur    History of stress test 03/31/2012   The post stress myocardial perfusion images show a normal pattern of perfusion in all region. The post left ventricles is normal in size. There is no scintigraphic of inductible myocardial ischemia. The post EF is 73   Hx of echocardiogram 11/29/2010   Ef 67%  Normal size chambes, Aortic valve sclerosis without stenosis, No other significant valvular abnormalities, No percardial effusion.   Hyperlipemia    Hypertension    Insulin resistance    Lung cancer (HCC)    Multiple thyroid nodules    Neuromuscular disorder (HCC)    nerve pain after shingles   Osteoporosis    Restless legs    Shingles     Past Surgical History:  Procedure Laterality Date   ABDOMINAL HYSTERECTOMY  1970   ANGIOPLASTY     Stenting of a 90% eccentric right coronary artery stenosis and had a 3.0x15 mm Integrity bare-metal stent inserted   ANTERIOR CERVICAL DECOMP/DISCECTOMY FUSION N/A 07/30/2013   Procedure: ANTERIOR CERVICAL DECOMPRESSION/DISCECTOMY FUSION CERVICAL FIVE -SIX;  Surgeon: Eustace Moore, MD;  Location: [facility];  Service: Neurosurgery;  Laterality: N/A;   APPENDECTOMY     BREAST SURGERY Left    cysts removed from left breast (in her 20'2)   CARDIAC CATHETERIZATION     Showed a widely patent stent, she did have 60% ostial diagonal-1 stenosis, She also had mild luminal irregularities of her LAD.   COLONOSCOPY     EYE SURGERY Bilateral 2009   cateract  surgery- bilateral   IR IMAGING GUIDED PORT INSERTION  06/04/2019   REFRACTIVE SURGERY Left 2011   TONSILLECTOMY      Allergies  Allergen Reactions   Codeine Nausea And Vomiting and Other (See Comments)    Severe constipation.    Current Outpatient Medications  Medication Sig Dispense Refill   acetaminophen (TYLENOL) 500 MG tablet Take 625 mg by mouth every 6 (six) hours as needed (pain). Per Pt 12/17/20     ALPRAZolam (XANAX) 0.5 MG tablet Take 1 tablet by mouth 2 (two) times daily as needed for anxiety.      amLODipine (NORVASC) 2.5 MG tablet Take 2.5 mg by mouth daily.     buPROPion (WELLBUTRIN SR) 150 MG 12 hr tablet Take 150 mg by mouth daily.     carboxymethylcellulose (REFRESH PLUS) 0.5 % SOLN Apply 1 drop to eye 3 (three) times daily as needed.     cefdinir (OMNICEF) 300 MG capsule Take 300 mg by mouth 2 (two) times daily.     COVID-19 mRNA vaccine, Pfizer, 30 MCG/0.3ML injection Inject into the muscle. 0.3 mL 0   gabapentin (NEURONTIN) 300 MG capsule Take 300 mg by mouth at bedtime.      hydrochlorothiazide (MICROZIDE) 12.5 MG capsule Take 1 capsule (12.5 mg total) by mouth daily. 90 capsule 3   HYDROcodone-acetaminophen (NORCO) 5-325 MG tablet Take 1-2 tablets by mouth every 6 (six) hours as needed for moderate pain. 30 tablet 0   influenza vaccine adjuvanted (FLUAD) 0.5 ML injection Inject into the muscle. 0.5 mL 0   isosorbide mononitrate (IMDUR) 30 MG 24 hr tablet Take 1 tablet (30 mg total) by mouth daily. 90 tablet 3   lidocaine (LIDODERM) 5 % SMARTSIG:0-3 Patch(s) Topical Daily     lidocaine-prilocaine (EMLA) cream Apply 1 application topically as needed. 30 g 2   lisinopril (ZESTRIL) 10 MG tablet TAKE 1 TABLET BY MOUTH  DAILY 90 tablet 3   Magnesium 250 MG TABS Take 250 mg by mouth at bedtime.      metFORMIN (GLUCOPHAGE) 500 MG tablet Take 500 mg by mouth daily.     metFORMIN (GLUCOPHAGE-XR) 500 MG 24 hr tablet Take 500 mg by mouth at bedtime.      metoprolol tartrate  (LOPRESSOR) 25 MG tablet Take 25 mg  by mouth at bedtime.      Multiple Vitamins-Minerals (CENTRUM SILVER PO) Take 1 tablet by mouth daily.     ondansetron (ZOFRAN) 8 MG tablet Take 1 tablet (8 mg total) by mouth every 8 (eight) hours as needed for nausea or vomiting. 30 tablet 1   polyethylene glycol (MIRALAX / GLYCOLAX) packet Take 17 g by mouth every morning.      rOPINIRole (REQUIP) 1 MG tablet Take 1 mg by mouth 2 (two) times daily. In the evening     rosuvastatin (CRESTOR) 40 MG tablet Take 40 mg by mouth at bedtime.      traZODone (DESYREL) 100 MG tablet Take 100 mg by mouth at bedtime.      Vitamin D, Ergocalciferol, (DRISDOL) 50000 UNITS CAPS capsule Take 50,000 Units by mouth every 7 (seven) days.     naproxen sodium (ALEVE) 220 MG tablet Take 440 mg by mouth 2 (two) times daily as needed (pain).     nitroGLYCERIN (NITROSTAT) 0.4 MG SL tablet Place 1 tablet (0.4 mg total) under the tongue every 5 (five) minutes as needed for chest pain. 25 tablet 11   zinc gluconate 50 MG tablet Take 50 mg by mouth daily.     No current facility-administered medications for this visit.    Social History   Socioeconomic History   Marital status: Married    Spouse name: Not on file   Number of children: Not on file   Years of education: Not on file   Highest education level: Not on file  Occupational History   Not on file  Tobacco Use   Smoking status: Former    Packs/day: 2.00    Years: 33.00    Pack years: 66.00    Types: Cigarettes   Smokeless tobacco: Never   Tobacco comments:    quit in 1995.  Vaping Use   Vaping Use: Never used  Substance and Sexual Activity   Alcohol use: Yes    Alcohol/week: 0.0 standard drinks    Comment: occasional (maybe twice a year)   Drug use: No   Sexual activity: Not on file  Other Topics Concern   Not on file  Social History Narrative   Not on file   Social Determinants of Health   Financial Resource Strain: Not on file  Food Insecurity: No  Food Insecurity   Worried About Running Out of Food in the Last Year: Never true   Ran Out of Food in the Last Year: Never true  Transportation Needs: No Transportation Needs   Lack of Transportation (Medical): No   Lack of Transportation (Non-Medical): No  Physical Activity: Not on file  Stress: Not on file  Social Connections: Not on file  Intimate Partner Violence: Not on file    Family History  Problem Relation Age of Onset   Alzheimer's disease Mother 1   Colon polyps Mother    Diabetes Mother    Heart attack Father 97   Cancer Maternal Grandmother 83   Breast cancer Maternal Grandmother    Diabetes Sister    Breast cancer Maternal Uncle    Breast cancer Maternal Aunt        x 2   Colon polyps Sister    Colon polyps Maternal Aunt    Colon cancer Neg Hx    Esophageal cancer Neg Hx    Rectal cancer Neg Hx    Stomach cancer Neg Hx    Socially she is married has one child and 2 grandchildren.  There is no tobacco or alcohol use.  ROS General: Negative; No fevers, chills, or night sweats;  HEENT: Negative; No changes in vision or hearing, sinus congestion, difficulty swallowing Pulmonary: Positive for non-small cell lung cancer, metastatic to bone multiple sites Cardiovascular: see HPI GI: GERD; No nausea, vomiting, diarrhea, or abdominal pain GU: Negative; No dysuria, hematuria, or difficulty voiding Musculoskeletal: Negative; no myalgias, joint pain, or weakness Hematologic/Oncology: Negative; no easy bruising, bleeding Endocrine: Negative; no heat/cold intolerance; no diabetes Neuro: Positive for restless legs Skin: Negative; No rashes or skin lesions Psychiatric: Negative; No behavioral problems, depression Sleep: Negative; No snoring, daytime sleepiness, hypersomnolence, bruxism, restless legs, hypnogognic hallucinations, no cataplexy Other comprehensive 14 point system review is negative   PE. BP (!) 144/60   Pulse (!) 59   Ht '5\' 5"'  (1.651 m)   Wt 188 lb  12.8 oz (85.6 kg)   SpO2 97%   BMI 31.42 kg/m   Repeat blood pressure by me was 140/70  Wt Readings from Last 3 Encounters:  06/27/21 188 lb 12.8 oz (85.6 kg)  06/10/21 186 lb (84.4 kg)  05/18/21 185 lb 9.6 oz (84.2 kg)   General: Alert, oriented, no distress.  Skin: normal turgor, no rashes, warm and dry HEENT: Normocephalic, atraumatic. Pupils equal round and reactive to light; sclera anicteric; extraocular muscles intact;  Nose without nasal septal hypertrophy Mouth/Parynx benign; Mallinpatti scale 3 Neck: No JVD, no carotid bruits; normal carotid upstroke Lungs: clear to ausculatation and percussion; no wheezing or rales Chest wall: without tenderness to palpitation Heart: PMI not displaced, RRR, s1 s2 normal, 1/6 systolic murmur, no diastolic murmur, no rubs, gallops, thrills, or heaves Abdomen: soft, nontender; no hepatosplenomehaly, BS+; abdominal aorta nontender and not dilated by palpation. Back: no CVA tenderness Pulses 2+ Musculoskeletal: full range of motion, normal strength, no joint deformities Extremities: Trace to 1+ pretibial edema bilaterally right lower extremity greater than left; no clubbing cyanosis, Homan's sign negative  Neurologic: grossly nonfocal; Cranial nerves grossly wnl Psychologic: Normal mood and affect  June 27, 2021 ECG (independently read by me): Sinus bradycardia at 53; Ist degree AV block; PR 236 msec  November 2021 ECG (independently read by me): Sinus bradycardia at 59; no ectopy; PR 200 msec, QTc 386 msec  September 12, 2019 ECG (independently read by me): Sinus bradycardia at 59; MIld LVH; normal intervals  January 2020 ECG (independently read by me): Sinus bradycardia 53 bpm with first-degree AV block..  Well to 10 ms.  LVH by voltage  February 2019 ECG (independently read by me): Sinus bradycardia at 59 bpm.  First degree AV block with a PR interval of 2:30 milliseconds.  No syncope ST-T changes.  November 2017 ECG (independently  read by me): Normal sinus rhythm at 62 bpm.  No ectopy.  Normal intervals.  May 2017 ECG (independently read by me): Sinus bradycardia 55 beats per minute with first-degree AV block, PR interval 238 ms.  No ST segment changes.  September 2016 ECG (independently read by me): Sinus bradycardia at 55 bpm.  Nonspecific ST changes  March 2015 ECG (independently read by me): Sinus bradycardia 55 beats per minute. Nonspecific ST changes appear.  Prior 06/03/2013 ECG: Sinus rhythm with first degree AV block. QRS complex V1 V2. Nonspecific ST changes which are present previously. Borderline LVH by voltage in aVL  LABS: BMP Latest Ref Rng & Units 06/10/2021 05/18/2021 04/28/2021  Glucose 70 - 99 mg/dL 107(H) 105(H) 113(H)  BUN 8 - 23 mg/dL 21 24(H) 26(H)  Creatinine 0.44 - 1.00 mg/dL 0.91 0.79 0.84  Sodium 135 - 145 mmol/L 136 133(L) 136  Potassium 3.5 - 5.1 mmol/L 4.5 4.7 4.5  Chloride 98 - 111 mmol/L 104 102 102  CO2 22 - 32 mmol/L '26 25 26  ' Calcium 8.9 - 10.3 mg/dL 9.7 9.6 9.7   Hepatic Function Latest Ref Rng & Units 06/10/2021 05/18/2021 04/28/2021  Total Protein 6.5 - 8.1 g/dL 6.7 6.7 6.3(L)  Albumin 3.5 - 5.0 g/dL 4.3 4.3 4.2  AST 15 - 41 U/L '23 17 18  ' ALT 0 - 44 U/L '18 13 17  ' Alk Phosphatase 38 - 126 U/L 61 64 70  Total Bilirubin 0.3 - 1.2 mg/dL 0.4 0.4 0.3   CBC Latest Ref Rng & Units 06/10/2021 05/18/2021 04/28/2021  WBC 4.0 - 10.5 K/uL 4.3 4.2 4.3  Hemoglobin 12.0 - 15.0 g/dL 11.2(L) 11.2(L) 10.9(L)  Hematocrit 36.0 - 46.0 % 35.7(L) 34.5(L) 33.9(L)  Platelets 150 - 400 K/uL 148(L) 146(L) 152   Lab Results  Component Value Date   MCV 93.7 06/10/2021   MCV 93.5 05/18/2021   MCV 93.1 04/28/2021   Lab Results  Component Value Date   TSH 0.971 04/28/2021   Lab Results  Component Value Date   HGBA1C 6.8 (H) 02/02/2011   Lipid Panel     Component Value Date/Time   CHOL 129 04/13/2015 0802   CHOL 141 06/13/2013 0838   TRIG 87 04/13/2015 0802   TRIG 129 06/13/2013 0838    HDL 57 04/13/2015 0802   HDL 53 06/13/2013 0838   CHOLHDL 2.3 04/13/2015 0802   VLDL 17 04/13/2015 0802   LDLCALC 55 04/13/2015 0802   LDLCALC 62 06/13/2013 0838     RADIOLOGY: No results found.  IMPRESSION: 1. Preoperative clearance   2. Coronary artery disease of native artery of native heart with stable angina pectoris ([place])   3. Non-small cell cancer of left lung ([place]) metastatic to bone   4. Bilateral lower extremity edema   5. Essential hypertension   6. Hyperlipidemia with target LDL less than 70   7. Asymmetric septal hypertrophy (HCC)   8. Restless leg syndrome     ASSESSMENT AND PLAN: Ms. Salmi is an 86 year old female who has established CAD and underwent stenting of her RCA with a bare metal stent in March 2012 at which time she also was found to have a 60% ostial diagonal stenosis. She has a history of hyperlipidemia.  An echo Doppler study in December 2014 showed an ejection fraction at 55-60% without wall motion abnormalities. There was mild aortic stenosis. Mitral valve was structurally normal.  Subsequently she had undergone a follow-up nuclear perfusion study due to symptoms of vague chest pain as well as exertional dyspnea which revealed normal perfusion.  An echo Doppler study on July 27, 2016 again showed LVEF at 55 to 60%.  There was grade 1 diastolic dysfunction.  There was minimal PA pressure elevation at 32 mm.  There was mild aortic sclerosis without restrictive mobility.  There was no AR.  Her most recent echo Doppler study in February 2021 showed normal systolic function with EF at 60 to 65%, moderate asymmetric LVH of the basal septal segment without  S.A.M. or LVOT obstruction.  There was grade 1 diastolic dysfunction.  She had upper normal RV systolic pressure at 30.  She was not felt to have aortic stenosis.  She unfortunately was diagnosed with metastatic non-small cell CA with multiple areas of bone metastases.  She is now on Keytruda infusions every 3  weeks.  She developed low-grade fever sore throat and general body aches and was diagnosed with COVID-19 infection on March 30, 2020 for which she received Regeneron antibiotic infusion on September 7.  When I saw her in May 2022 she was experiencing rare episodes of anginal-like symptoms and was taking sublingual nitroglycerin with benefit and was taking approximately 1 every 3 months.  At that time, her blood pressure was mildly elevated and I elected to add isosorbide 30 mg to her medical regimen.  Since the addition of low-dose nitrates she has been without recurrent anginal symptomatology.  She continues to be on amlodipine 2.5 mg, lisinopril 10 mg daily in addition to metoprolol tartrate her blood pressure today on recheck by me was 140/70.   I have renewed her sublingual nitroglycerin and provided her with a prescription for HCTZ 12.5 mg to initiate daily and then changed to as needed depending upon her ankle/pretibial edema and blood pressure.  She continues to be on rosuvastatin 40 mg daily with target LDL less than 70.  She is on Requip for restless legs.  She has benefited from additional Keytruda infusion.  Due to progressive back discomfort with sciatica symptoms down her right leg she is scheduled to undergo L4-L5; L5-S1 laminectomy and foraminotomy with interspinous plate by Dr. Sherley [name].  With her clinical stability, I have given her clearance to undergo the surgical procedure.  She is no longer on aspirin therapy.   I will see her in 6 months for follow-up evaluation or sooner if needed.     Troy Sine, MD, [facility]  06/27/2021 6:15 PM

## 2021-06-27 NOTE — Patient Instructions (Signed)
Medication Instructions:   START HCTZ 12.5 MG ONCE DAILY   *If you need a refill on your cardiac medications before your next appointment, please call your pharmacy*   Follow-Up: At [facility], you and your health needs are our priority.  As part of our continuing mission to provide you with exceptional heart care, we have created designated Provider Care Teams.  These Care Teams include your primary Cardiologist (physician) and Advanced Practice Providers (APPs -  Physician Assistants and Nurse Practitioners) who all work together to provide you with the care you need, when you need it.  We recommend signing up for the patient portal called "MyChart".  Sign up information is provided on this After Visit Summary.  MyChart is used to connect with patients for Virtual Visits (Telemedicine).  Patients are able to view lab/test results, encounter notes, upcoming appointments, etc.  Non-urgent messages can be sent to your provider as well.   To learn more about what you can do with MyChart, go to [email or web address].    Your next appointment:   6 month(s)  The format for your next appointment:   In Person  Provider:   Shelva Majestic, MD

## 2021-06-28 ENCOUNTER — Encounter: Payer: Self-pay | Admitting: Surgery

## 2021-07-01 ENCOUNTER — Inpatient Hospital Stay (HOSPITAL_BASED_OUTPATIENT_CLINIC_OR_DEPARTMENT_OTHER): Payer: Medicare Other | Admitting: Oncology

## 2021-07-01 ENCOUNTER — Other Ambulatory Visit (HOSPITAL_BASED_OUTPATIENT_CLINIC_OR_DEPARTMENT_OTHER): Payer: Self-pay

## 2021-07-01 ENCOUNTER — Telehealth: Payer: Self-pay

## 2021-07-01 ENCOUNTER — Inpatient Hospital Stay: Payer: Medicare Other | Attending: Oncology

## 2021-07-01 ENCOUNTER — Other Ambulatory Visit: Payer: Self-pay

## 2021-07-01 ENCOUNTER — Inpatient Hospital Stay: Payer: Medicare Other

## 2021-07-01 ENCOUNTER — Ambulatory Visit: Payer: Medicare Other | Attending: Internal Medicine

## 2021-07-01 ENCOUNTER — Other Ambulatory Visit: Payer: Self-pay | Admitting: Nurse Practitioner

## 2021-07-01 VITALS — BP 126/55 | HR 60 | Temp 98.0°F | Resp 20

## 2021-07-01 VITALS — BP 145/73 | HR 60 | Temp 98.1°F | Resp 18 | Ht 65.0 in | Wt 187.6 lb

## 2021-07-01 DIAGNOSIS — C3490 Malignant neoplasm of unspecified part of unspecified bronchus or lung: Secondary | ICD-10-CM

## 2021-07-01 DIAGNOSIS — Z5112 Encounter for antineoplastic immunotherapy: Secondary | ICD-10-CM | POA: Insufficient documentation

## 2021-07-01 DIAGNOSIS — C3432 Malignant neoplasm of lower lobe, left bronchus or lung: Secondary | ICD-10-CM | POA: Diagnosis present

## 2021-07-01 DIAGNOSIS — C3492 Malignant neoplasm of unspecified part of left bronchus or lung: Secondary | ICD-10-CM

## 2021-07-01 DIAGNOSIS — C7951 Secondary malignant neoplasm of bone: Secondary | ICD-10-CM | POA: Diagnosis present

## 2021-07-01 DIAGNOSIS — Z79899 Other long term (current) drug therapy: Secondary | ICD-10-CM | POA: Insufficient documentation

## 2021-07-01 DIAGNOSIS — Z23 Encounter for immunization: Secondary | ICD-10-CM

## 2021-07-01 LAB — CBC WITH DIFFERENTIAL (CANCER CENTER ONLY)
Abs Immature Granulocytes: 0.03 10*3/uL (ref 0.00–0.07)
Basophils Absolute: 0 10*3/uL (ref 0.0–0.1)
Basophils Relative: 1 %
Eosinophils Absolute: 0 10*3/uL (ref 0.0–0.5)
Eosinophils Relative: 1 %
HCT: 34.7 % — ABNORMAL LOW (ref 36.0–46.0)
Hemoglobin: 11.3 g/dL — ABNORMAL LOW (ref 12.0–15.0)
Immature Granulocytes: 1 %
Lymphocytes Relative: 19 %
Lymphs Abs: 0.9 10*3/uL (ref 0.7–4.0)
MCH: 30.1 pg (ref 26.0–34.0)
MCHC: 32.6 g/dL (ref 30.0–36.0)
MCV: 92.3 fL (ref 80.0–100.0)
Monocytes Absolute: 0.4 10*3/uL (ref 0.1–1.0)
Monocytes Relative: 9 %
Neutro Abs: 3 10*3/uL (ref 1.7–7.7)
Neutrophils Relative %: 69 %
Platelet Count: 152 10*3/uL (ref 150–400)
RBC: 3.76 MIL/uL — ABNORMAL LOW (ref 3.87–5.11)
RDW: 13.6 % (ref 11.5–15.5)
WBC Count: 4.4 10*3/uL (ref 4.0–10.5)
nRBC: 0 % (ref 0.0–0.2)

## 2021-07-01 LAB — CMP (CANCER CENTER ONLY)
ALT: 18 U/L (ref 0–44)
AST: 22 U/L (ref 15–41)
Albumin: 4.5 g/dL (ref 3.5–5.0)
Alkaline Phosphatase: 64 U/L (ref 38–126)
Anion gap: 8 (ref 5–15)
BUN: 27 mg/dL — ABNORMAL HIGH (ref 8–23)
CO2: 24 mmol/L (ref 22–32)
Calcium: 10.1 mg/dL (ref 8.9–10.3)
Chloride: 104 mmol/L (ref 98–111)
Creatinine: 0.84 mg/dL (ref 0.44–1.00)
GFR, Estimated: >60 mL/min (ref >60)
Glucose, Bld: 122 mg/dL — ABNORMAL HIGH (ref 70–99)
Potassium: 4.1 mmol/L (ref 3.5–5.1)
Sodium: 136 mmol/L (ref 135–145)
Total Bilirubin: 0.4 mg/dL (ref 0.3–1.2)
Total Protein: 6.7 g/dL (ref 6.5–8.1)

## 2021-07-01 LAB — TSH: TSH: 1.043 u[IU]/mL (ref 0.350–4.500)

## 2021-07-01 MED ORDER — SODIUM CHLORIDE 0.9 % IV SOLN: Freq: Once | INTRAVENOUS | Status: AC

## 2021-07-01 MED ORDER — PFIZER COVID-19 VAC BIVALENT 30 MCG/0.3ML IM SUSP
INTRAMUSCULAR | 0 refills | Status: DC
  Filled 2021-07-01: qty 0.3, 1d supply, fill #0

## 2021-07-01 MED ORDER — SODIUM CHLORIDE 0.9% FLUSH
10.0000 mL | INTRAVENOUS | Status: DC | PRN
  Administered 2021-07-01: 10 mL

## 2021-07-01 MED ORDER — SODIUM CHLORIDE 0.9 % IV SOLN
200.0000 mg | Freq: Once | INTRAVENOUS | Status: AC
  Administered 2021-07-01: 200 mg via INTRAVENOUS
  Filled 2021-07-01: qty 8

## 2021-07-01 MED ORDER — HYDROCODONE-ACETAMINOPHEN 5-325 MG PO TABS: 1.0000 | ORAL_TABLET | Freq: Four times a day (QID) | ORAL | 0 refills | Status: DC | PRN

## 2021-07-01 MED ORDER — HEPARIN SOD (PORK) LOCK FLUSH 100 UNIT/ML IV SOLN
500.0000 [IU] | Freq: Once | INTRAVENOUS | Status: AC | PRN
  Administered 2021-07-01: 500 [IU]

## 2021-07-01 NOTE — Progress Notes (Signed)
[facility] OFFICE PROGRESS NOTE   Diagnosis: Non-small cell lung cancer  INTERVAL HISTORY:   [name] completed another treatment pembrolizumab on 06/10/2021.  She continues to have pain in the right "hip "area.  She is scheduled to see Dr. Ronnald Ramp to decide on lumbar surgery.  She reports increased lower leg swelling bilaterally.  She saw Dr. Claiborne Billings earlier this week and was started on HCTZ.  Objective:  Vital signs in last 24 hours:  Blood pressure (!) 145/73, pulse 60, temperature 98.1 F (36.7 C), temperature source Oral, resp. rate 18, height _0  (1.651 m), weight 187 lb 9.6 oz (85.1 kg), SpO2 100 %.    Resp: Lungs clear bilaterally Cardio: Regular rate and rhythm GI: No hepatosplenomegaly Vascular: Trace lower pretibial edema bilaterally, no erythema or palpable cord     Portacath/PICC-without erythema  Lab Results:  Lab Results  Component Value Date   WBC 4.4 07/01/2021   HGB 11.3 (L) 07/01/2021   HCT 34.7 (L) 07/01/2021   MCV 92.3 07/01/2021   PLT 152 07/01/2021   NEUTROABS 3.0 07/01/2021    CMP  Lab Results  Component Value Date   NA 136 07/01/2021   K 4.1 07/01/2021   CL 104 07/01/2021   CO2 24 07/01/2021   GLUCOSE 122 (H) 07/01/2021   BUN 27 (H) 07/01/2021   CREATININE 0.84 07/01/2021   CALCIUM 10.1 07/01/2021   PROT 6.7 07/01/2021   ALBUMIN 4.5 07/01/2021   AST 22 07/01/2021   ALT 18 07/01/2021   ALKPHOS 64 07/01/2021   BILITOT 0.4 07/01/2021   GFRNONAA >60 07/01/2021   GFRAA >60 04/21/2020     Medications: I have reviewed the patient's current medications.   Assessment/Plan: Non-small cell lung cancer MRI lumbar spine 04/29/2019- enlarging marrow lesions involving the L1 vertebral body, upper left sacrum and right iliac bone MRI pelvis 04/29/2019- 3.5 cm left iliac bone lesion appears slightly larger; other similar appearing lesions present within the left superior pubic ramus, left superior abdomen acetabulum and upper  left sacrum Kappa free light chains with mild elevation 05/12/2019  CTs 05/12/2019- left lower lobe pulmonary mass 3.3 x 3.2 cm; lytic process left iliac bone; spinal lesions; 1.1 cm low-density left kidney lesion; right thyroid enlargement with heterogeneous appearance with potential for multiple discrete lesions Biopsy left lower lobe lung mass 05/26/2019-poorly differentiated carcinoma; positive for cytokeratin 5/6, p63 and TTF-1, no EGFR, BRAF, ALK, ERBB2,ROS, or NTRK alteration Cycle 1 carboplatin/Alimta/pembrolizumab 06/06/2019 Cycle 2 carboplatin/Alimta/pembrolizumab 06/27/2019 Cycle 3 carboplatin/Alimta/pembrolizumab 07/18/2019 Cycle 4 carboplatin/Alimta/pembrolizumab 08/07/2019 CTs 08/27/2019-significant decrease in size of lobulated mass left lower lobe.  Unchanged appearance of subtle bone lesions. Cycle 5 Alimta/pembrolizumab 08/28/2019 Cycle 6 Alimta/pembrolizumab 09/18/2019 Cycle 7 Alimta/pembrolizumab 10/09/2019 Cycle 8 Alimta/pembrolizumab 10/30/2019 Cycle 9 Alimta/pembrolizumab 11/20/2019 CTs 12/09/2019-no evidence of disease progression, left lower lobe nodule slightly decreased in size, stable L1, left sacral, and left pubic ramus metastases Cycle 10 Alimta/pembrolizumab 12/11/2019 Cycle 11 pembrolizumab alone 01/02/2020 (Alimta held due to edema, tenderness, erythema at the lower legs) Cycle 12 pembrolizumab 01/23/2020 Cycle 13 pembrolizumab 02/12/2020 Cycle 14 pembrolizumab 03/04/2020 CTs 03/18/2020-stable left lower lobe lesion, mild sclerosis at the superior endplate of L1 that was previously hypermetabolic, stable small left upper sacral lucent lesion, previous left superior pubic ramus lesion is occult on the CT, CT head negative for malignancy Cycle 15 pembrolizumab 03/25/2020 Cycle 16 pembrolizumab 04/21/2020 Cycle 17 pembrolizumab 05/17/2020 05/19/2020 bone scan-no definite abnormalities to suggest osseous metastases.  Areas of concern on prior PET-CT involving left  iliac bone and L1  vertebral body showed no abnormalities on the current study Cycle 18 Pembrolizumab 06/10/2020 Cycle 19 Pembrolizumab 06/30/2020 CTs 07/16/2020-stable left lower lobe nodule, stable faint superior L1 vertebral lesion, no evidence of disease progression Cycle 20 pembrolizumab 07/21/2020 Cycle 21 Pembrolizumab 08/11/2020 Cycle 22 pembrolizumab 09/01/2020 Cycle 23 Pembrolizumab 09/22/2020 Cycle 24 pembrolizumab 10/13/2020 Cycle 25 Pembrolizumab 11/03/2020 Cycle 26 pembrolizumab 11/26/2020 CTs 12/15/2020- stable left lower lobe mass, stable sclerotic lesion at L2, no evidence of disease progression Cycle 27 pembrolizumab 12/17/2020 PET scan 01/03/2021-1.8 x 1.3 cm left lower lobe nodule similar in size to CT of 12/15/2020 and measures smaller than previous PET/CT from 2020.  Nodule is markedly hypermetabolic.  No evidence for hypermetabolic hilar or mediastinal lymphadenopathy.  Several tiny foci of hypermetabolism identified in bony anatomy raising concern for skeletal metastases.  Comparison of the PET to CT 07/16/2020-lobular left lower lobe pulmonary nodule measured 2.4 x 1.5 cm on the prior study, current study it measured 1.8 x 1.3 cm.  Lesion in the anterior left acetabulum is similar to the 07/16/2020 exam although overlying cortical thinning slightly more pronounced on the current study.  Described lesion in the scapula shows some cortical sclerosis and a tiny central marrow lucency not substantially changed compared to 07/16/2020.  Left third rib lesion shows heterogeneous mineralization similar to 07/16/2020. MRI of cervical spine 01/13/2021-increased left facet edema at C5-C6, severe facet arthrosis on the left at C7-T1 and on the right at C3-C4, no evidence of metastatic disease Cycle 28 Pembrolizumab 01/14/2021 Cycle 29 Pembrolizumab 02/03/2021 Cycle 30 Pembrolizumab 02/24/2021 MRI left hip 03/02/2021-compared to 04/29/2019, slight increase in size of metastases at the left iliac and left acetabulum.  Lesion at  the left upper sacrum is less conspicuous and a lesion at the left superior pubic ramus is stable Cycle 31 pembrolizumab 03/17/2021 Radiation to left acetabulum and left iliac 03/30/2021-04/13/2021 Cycle 32 pembrolizumab 04/07/2021 Cycle 33 Pembrolizumab 04/28/2021 Cycle 34 pembrolizumab 05/18/2021 PET scan 06/01/2021-no significant change in size or degree of FDG uptake associated with FDG avid left lower lobe lung nodule compatible with neoplasm.  Stable to improved appearance of multifocal FDG avid bone metastasis. Cycle 35 pembrolizumab 06/10/2021 Cycle 36 pembrolizumab 07/01/2021 Pain secondary to #1, improved Chronic back pain Type 2 diabetes Essential hypertension CAD Hyperlipidemia Family history significant for multiple members with breast cancer Grade 1 skin rash 07/18/2019 likely related to immunotherapy.  Topical steroid cream as needed. E. coli urinary tract infection 07/14/2019.  Completed cephalexin. Edema/tenderness at the right greater than left ankle 10/21/2019-etiology unclear, potentially related to systemic therapy or an infection, doxycycline prescribed-improved 10/23/2019; marked improvement 11/20/2019; at office visit 01/02/2020 she reports worsening of lower extremity edema, pain/tenderness, erythema 3 to 4 days following each treatment.  Alimta held 01/02/2020.  Referral to dermatology. COVID-19 infection 03/30/2020, monoclonal antibody therapy 04/06/2020       Disposition: [name] appears unchanged.  She will complete another treatment pembrolizumab today.  She is deciding on proceeding with lumbar spine surgery later this month.  She is scheduled to return for an office visit and pembrolizumab on 07/22/2021.  [name] will receive a COVID-19 vaccine today.  I recommended she try leg elevation and support stockings for the lower extremity edema.  [name], MD  07/01/2021  11:05 AM

## 2021-07-01 NOTE — Telephone Encounter (Signed)
Patient seen by Dr. Sherrill today ? ?Vitals are within treatment parameters. ? ?Labs reviewed by Dr. Sherrill and are within treatment parameters. ? ?Per physician team, patient is ready for treatment and there are NO modifications to the treatment plan.  ?

## 2021-07-01 NOTE — Progress Notes (Signed)
Patient presents for treatment. RN assessment completed along with the following:  Labs/vitals reviewed - Yes, and within treatment parameters.   Weight within 10% of previous measurement - Yes Oncology Treatment Attestation completed for current therapy- Yes, on date 05/29/2019 Informed consent completed and reflects current therapy/intent - Yes, on date 06/06/2019             Provider progress note reviewed - Today's provider note is not yet available. I reviewed the most recent oncology provider progress note in chart dated 11/11/20222. Treatment/Antibody/Supportive plan reviewed - Yes, and there are no adjustments needed for today's treatment. S&H and other orders reviewed - Yes, and there are no additional orders identified. Previous treatment date reviewed - Yes, and the appropriate amount of time has elapsed between treatments. Clinic Hand Off Received from - [facility] Nurse Note  Patient to proceed with treatment.

## 2021-07-01 NOTE — Progress Notes (Signed)
   Covid-19 Vaccination Clinic  Name:  LELANI GARNETT    MRN: [id number] DOB: 09-07-1934  07/01/2021  Ms. Dack was observed post Covid-19 immunization for 15 minutes without incident. She was provided with Vaccine Information Sheet and instruction to access the V-Safe system.   Ms. Marasco was instructed to call 911 with any severe reactions post vaccine: Difficulty breathing  Swelling of face and throat  A fast heartbeat  A bad rash all over body  Dizziness and weakness   Immunizations Administered     Name Date Dose VIS Date Route   Pfizer Covid-19 Vaccine Bivalent Booster 07/01/2021 12:54 PM 0.3 mL 03/30/2021 Intramuscular   Manufacturer: [place]   Lot: [id number]   [place]: [phone]

## 2021-07-01 NOTE — Patient Instructions (Signed)
[facility]   Discharge Instructions: Thank you for choosing [facility] to provide your oncology and hematology care.   If you have a lab appointment with the [facility], please go directly to the [facility] and check in at the registration area.   Wear comfortable clothing and clothing appropriate for easy access to any Portacath or PICC line.   We strive to give you quality time with your provider. You may need to reschedule your appointment if you arrive late (15 or more minutes).  Arriving late affects you and other patients whose appointments are after yours.  Also, if you miss three or more appointments without notifying the office, you may be dismissed from the clinic at the provider's discretion.      For prescription refill requests, have your pharmacy contact our office and allow 72 hours for refills to be completed.    Today you received the following chemotherapy and/or immunotherapy agents Pembrolizumab (KEYTRUDA).      To help prevent nausea and vomiting after your treatment, we encourage you to take your nausea medication as directed.  BELOW ARE SYMPTOMS THAT SHOULD BE REPORTED IMMEDIATELY: *FEVER GREATER THAN 100.4 F (38 C) OR HIGHER *CHILLS OR SWEATING *NAUSEA AND VOMITING THAT IS NOT CONTROLLED WITH YOUR NAUSEA MEDICATION *UNUSUAL SHORTNESS OF BREATH *UNUSUAL BRUISING OR BLEEDING *URINARY PROBLEMS (pain or burning when urinating, or frequent urination) *BOWEL PROBLEMS (unusual diarrhea, constipation, pain near the anus) TENDERNESS IN MOUTH AND THROAT WITH OR WITHOUT PRESENCE OF ULCERS (sore throat, sores in mouth, or a toothache) UNUSUAL RASH, SWELLING OR PAIN  UNUSUAL VAGINAL DISCHARGE OR ITCHING   Items with * indicate a potential emergency and should be followed up as soon as possible or go to the Emergency Department if any problems should occur.  Please show the CHEMOTHERAPY ALERT CARD or IMMUNOTHERAPY ALERT CARD  at check-in to the Emergency Department and triage nurse.  Should you have questions after your visit or need to cancel or reschedule your appointment, please contact [facility]  Dept: [phone]  and follow the prompts.  Office hours are 8:00 a.m. to 4:30 p.m. Monday - Friday. Please note that voicemails left after 4:00 p.m. may not be returned until the following business day.  We are closed weekends and major holidays. You have access to a nurse at all times for urgent questions. Please call the main number to the clinic Dept: [phone] and follow the prompts.   For any non-urgent questions, you may also contact your provider using MyChart. We now offer e-Visits for anyone 47 and older to request care online for non-urgent symptoms. For details visit [email or web address].   Also download the MyChart app! Go to the app store, search "MyChart", open the app, select [facility], and log in with your MyChart username and password.  Due to Covid, a mask is required upon entering the hospital/clinic. If you do not have a mask, one will be given to you upon arrival. For doctor visits, patients may have 1 support person aged 59 or older with them. For treatment visits, patients cannot have anyone with them due to current Covid guidelines and our immunocompromised population.   Pembrolizumab injection What is this medication? PEMBROLIZUMAB (pem broe liz ue mab) is a monoclonal antibody. It is used to treat certain types of cancer. This medicine may be used for other purposes; ask your health care provider or pharmacist if you have questions. COMMON BRAND NAME(S):  Keytruda What should I tell my care team before I take this medication? They need to know if you have any of these conditions: autoimmune diseases like Crohn's disease, ulcerative colitis, or lupus have had or planning to have an allogeneic stem cell transplant (uses someone else's stem cells) history of  organ transplant history of chest radiation nervous system problems like myasthenia gravis or Guillain-Barre syndrome an unusual or allergic reaction to pembrolizumab, other medicines, foods, dyes, or preservatives pregnant or trying to get pregnant breast-feeding How should I use this medication? This medicine is for infusion into a vein. It is given by a health care professional in a hospital or clinic setting. A special MedGuide will be given to you before each treatment. Be sure to read this information carefully each time. Talk to your pediatrician regarding the use of this medicine in children. While this drug may be prescribed for children as young as 6 months for selected conditions, precautions do apply. Overdosage: If you think you have taken too much of this medicine contact a poison control center or emergency room at once. NOTE: This medicine is only for you. Do not share this medicine with others. What if I miss a dose? It is important not to miss your dose. Call your doctor or health care professional if you are unable to keep an appointment. What may interact with this medication? Interactions have not been studied. This list may not describe all possible interactions. Give your health care provider a list of all the medicines, herbs, non-prescription drugs, or dietary supplements you use. Also tell them if you smoke, drink alcohol, or use illegal drugs. Some items may interact with your medicine. What should I watch for while using this medication? Your condition will be monitored carefully while you are receiving this medicine. You may need blood work done while you are taking this medicine. Do not become pregnant while taking this medicine or for 4 months after stopping it. Women should inform their doctor if they wish to become pregnant or think they might be pregnant. There is a potential for serious side effects to an unborn child. Talk to your health care professional or  pharmacist for more information. Do not breast-feed an infant while taking this medicine or for 4 months after the last dose. What side effects may I notice from receiving this medication? Side effects that you should report to your doctor or health care professional as soon as possible: allergic reactions like skin rash, itching or hives, swelling of the face, lips, or tongue bloody or black, tarry breathing problems changes in vision chest pain chills confusion constipation cough diarrhea dizziness or feeling faint or lightheaded fast or irregular heartbeat fever flushing joint pain low blood counts - this medicine may decrease the number of white blood cells, red blood cells and platelets. You may be at increased risk for infections and bleeding. muscle pain muscle weakness pain, tingling, numbness in the hands or feet persistent headache redness, blistering, peeling or loosening of the skin, including inside the mouth signs and symptoms of high blood sugar such as dizziness; dry mouth; dry skin; fruity breath; nausea; stomach pain; increased hunger or thirst; increased urination signs and symptoms of kidney injury like trouble passing urine or change in the amount of urine signs and symptoms of liver injury like dark urine, light-colored stools, loss of appetite, nausea, right upper belly pain, yellowing of the eyes or skin sweating swollen lymph nodes weight loss Side effects that usually do not  require medical attention (report to your doctor or health care professional if they continue or are bothersome): decreased appetite hair loss tiredness This list may not describe all possible side effects. Call your doctor for medical advice about side effects. You may report side effects to FDA at 1-800-FDA-1088. Where should I keep my medication? This drug is given in a hospital or clinic and will not be stored at home. NOTE: This sheet is a summary. It may not cover all possible  information. If you have questions about this medicine, talk to your doctor, pharmacist, or health care provider.  2022 Elsevier/Gold Standard (2021-04-05 00:00:00)

## 2021-07-11 NOTE — Progress Notes (Signed)
Surgical Instructions    Your procedure is scheduled on Monday, December 19th, 2022.   Report to [facility] Main Entrance "A" at 05:30 A.M., then check in with the Admitting office.  Call this number if you have problems the morning of surgery:  [phone]   If you have any questions prior to your surgery date call [phone]: Open Monday-Friday 8am-4pm    Remember:  Do not eat or drink after midnight the night before your surgery    Take these medicines the morning of surgery with A SIP OF WATER:  amLODipine (NORVASC) isosorbide mononitrate (IMDUR) rOPINIRole (REQUIP)   If needed:  ALPRAZolam Duanne Moron)  carboxymethylcellulose (REFRESH PLUS) chlorpheniramine (CHLOR-TRIMETON) HYDROcodone-acetaminophen (NORCO) nitroGLYCERIN (NITROSTAT)  ondansetron (ZOFRAN)    As of today, STOP taking any Aspirin (unless otherwise instructed by your surgeon) Aleve, Naproxen, Ibuprofen, Motrin, Advil, Goody's, BC's, all herbal medications, fish oil, and all vitamins.   WHAT DO I DO ABOUT MY DIABETES MEDICATION?   Do not take metFORMIN (GLUCOPHAGE) the morning of surgery       HOW TO MANAGE YOUR DIABETES BEFORE AND AFTER SURGERY  Why is it important to control my blood sugar before and after surgery? Improving blood sugar levels before and after surgery helps healing and can limit problems. A way of improving blood sugar control is eating a healthy diet by:  Eating less sugar and carbohydrates  Increasing activity/exercise  Talking with your doctor about reaching your blood sugar goals High blood sugars (greater than 180 mg/dL) can raise your risk of infections and slow your recovery, so you will need to focus on controlling your diabetes during the weeks before surgery. Make sure that the doctor who takes care of your diabetes knows about your planned surgery including the date and location.  How do I manage my blood sugar before surgery? Check your blood sugar at least 4 times a  day, starting 2 days before surgery, to make sure that the level is not too high or low.  Check your blood sugar the morning of your surgery when you wake up and every 2 hours until you get to the Short Stay unit.  If your blood sugar is less than 70 mg/dL, you will need to treat for low blood sugar: Do not take insulin. Treat a low blood sugar (less than 70 mg/dL) with  cup of clear juice (cranberry or apple), 4 glucose tablets, OR glucose gel. Recheck blood sugar in 15 minutes after treatment (to make sure it is greater than 70 mg/dL). If your blood sugar is not greater than 70 mg/dL on recheck, call [phone] for further instructions. Report your blood sugar to the short stay nurse when you get to Short Stay.  If you are admitted to the hospital after surgery: Your blood sugar will be checked by the staff and you will probably be given insulin after surgery (instead of oral diabetes medicines) to make sure you have good blood sugar levels. The goal for blood sugar control after surgery is 80-180 mg/dL.   After your COVID test   You are not required to quarantine however you are required to wear a well-fitting mask when you are out and around people not in your household.  If your mask becomes wet or soiled, replace with a new one.  Wash your hands often with soap and water for 20 seconds or clean your hands with an alcohol-based hand sanitizer that contains at least 60% alcohol.  Do not share personal items.  Notify  your provider: if you are in close contact with someone who has COVID  or if you develop a fever of 100.4 or greater, sneezing, cough, sore throat, shortness of breath or body aches.    The day of surgery:          Do not wear jewelry or makeup Do not wear lotions, powders, perfumes, or deodorant. Do not shave 48 hours prior to surgery.   Do not bring valuables to the hospital. DO Not wear nail polish, gel polish, artificial nails, or any other type of covering on  natural nails including finger and toenails. If patients have artificial nails, gel coating, etc. that need to be removed by a nail salon, please have this removed prior to surgery or surgery may need to be canceled/delayed if the surgeon/ anesthesia feels like the patient is unable to be adequately monitored.              [facility] is not responsible for any belongings or valuables.  Do NOT Smoke (Tobacco/Vaping)  24 hours prior to your procedure  If you use a CPAP at night, you may bring your mask for your overnight stay.   Contacts, glasses, hearing aids, dentures or partials may not be worn into surgery, please bring cases for these belongings   For patients admitted to the hospital, discharge time will be determined by your treatment team.   Patients discharged the day of surgery will not be allowed to drive home, and someone needs to stay with them for 24 hours.  NO VISITORS WILL BE ALLOWED IN PRE-OP WHERE PATIENTS ARE PREPPED FOR SURGERY.  ONLY 1 SUPPORT PERSON MAY BE PRESENT IN THE WAITING ROOM WHILE YOU ARE IN SURGERY.  IF YOU ARE TO BE ADMITTED, ONCE YOU ARE IN YOUR ROOM YOU WILL BE ALLOWED TWO (2) VISITORS. 1 (ONE) VISITOR MAY STAY OVERNIGHT BUT MUST ARRIVE TO THE ROOM BY 8pm.  Minor children may have two parents present. Special consideration for safety and communication needs will be reviewed on a case by case basis.  Special instructions:    Oral Hygiene is also important to reduce your risk of infection.  Remember - BRUSH YOUR TEETH THE MORNING OF SURGERY WITH YOUR REGULAR TOOTHPASTE   [place]- Preparing For Surgery  Before surgery, you can play an important role. Because skin is not sterile, your skin needs to be as free of germs as possible. You can reduce the number of germs on your skin by washing with CHG (chlorahexidine gluconate) Soap before surgery.  CHG is an antiseptic cleaner which kills germs and bonds with the skin to continue killing germs even after  washing.     Please do not use if you have an allergy to CHG or antibacterial soaps. If your skin becomes reddened/irritated stop using the CHG.  Do not shave (including legs and underarms) for at least 48 hours prior to first CHG shower. It is OK to shave your face.  Please follow these instructions carefully.     Shower the NIGHT BEFORE SURGERY and the MORNING OF SURGERY with CHG Soap.   If you chose to wash your hair, wash your hair first as usual with your normal shampoo. After you shampoo, rinse your hair and body thoroughly to remove the shampoo.  Then [employer] and genitals (private parts) with your normal soap and rinse thoroughly to remove soap.  After that Use CHG Soap as you would any other liquid soap. You can apply CHG directly  to the skin and wash gently with a scrungie or a clean washcloth.   Apply the CHG Soap to your body ONLY FROM THE NECK DOWN.  Do not use on open wounds or open sores. Avoid contact with your eyes, ears, mouth and genitals (private parts). Wash Face and genitals (private parts)  with your normal soap.   Wash thoroughly, paying special attention to the area where your surgery will be performed.  Thoroughly rinse your body with warm water from the neck down.  DO NOT shower/wash with your normal soap after using and rinsing off the CHG Soap.  Pat yourself dry with a CLEAN TOWEL.  Wear CLEAN PAJAMAS to bed the night before surgery  Place CLEAN SHEETS on your bed the night before your surgery  DO NOT SLEEP WITH PETS.   Day of Surgery:  Take a shower with CHG soap. Wear Clean/Comfortable clothing the morning of surgery Do not apply any deodorants/lotions.   Remember to brush your teeth WITH YOUR REGULAR TOOTHPASTE.   Please read over the following fact sheets that you were given.

## 2021-07-12 ENCOUNTER — Encounter (HOSPITAL_COMMUNITY): Payer: Self-pay

## 2021-07-12 ENCOUNTER — Other Ambulatory Visit: Payer: Self-pay

## 2021-07-12 ENCOUNTER — Encounter (HOSPITAL_COMMUNITY)
Admission: RE | Admit: 2021-07-12 | Discharge: 2021-07-12 | Disposition: A | Discharge status: Home / Self Care | Payer: Medicare Other | Source: Ambulatory Visit | Attending: Neurological Surgery | Admitting: Neurological Surgery

## 2021-07-12 VITALS — BP 141/61 | HR 60 | Temp 97.7°F | Resp 18

## 2021-07-12 DIAGNOSIS — Z85118 Personal history of other malignant neoplasm of bronchus and lung: Secondary | ICD-10-CM | POA: Diagnosis not present

## 2021-07-12 DIAGNOSIS — I2510 Atherosclerotic heart disease of native coronary artery without angina pectoris: Secondary | ICD-10-CM | POA: Diagnosis not present

## 2021-07-12 DIAGNOSIS — M4310 Spondylolisthesis, site unspecified: Secondary | ICD-10-CM | POA: Insufficient documentation

## 2021-07-12 DIAGNOSIS — K449 Diaphragmatic hernia without obstruction or gangrene: Secondary | ICD-10-CM | POA: Diagnosis not present

## 2021-07-12 DIAGNOSIS — E785 Hyperlipidemia, unspecified: Secondary | ICD-10-CM | POA: Insufficient documentation

## 2021-07-12 DIAGNOSIS — Z87891 Personal history of nicotine dependence: Secondary | ICD-10-CM | POA: Insufficient documentation

## 2021-07-12 DIAGNOSIS — Z8616 Personal history of COVID-19: Secondary | ICD-10-CM | POA: Insufficient documentation

## 2021-07-12 DIAGNOSIS — Z923 Personal history of irradiation: Secondary | ICD-10-CM | POA: Insufficient documentation

## 2021-07-12 DIAGNOSIS — Z01812 Encounter for preprocedural laboratory examination: Secondary | ICD-10-CM | POA: Diagnosis not present

## 2021-07-12 DIAGNOSIS — Z955 Presence of coronary angioplasty implant and graft: Secondary | ICD-10-CM | POA: Diagnosis not present

## 2021-07-12 DIAGNOSIS — E1136 Type 2 diabetes mellitus with diabetic cataract: Secondary | ICD-10-CM | POA: Diagnosis not present

## 2021-07-12 DIAGNOSIS — Z01818 Encounter for other preprocedural examination: Secondary | ICD-10-CM

## 2021-07-12 DIAGNOSIS — C7951 Secondary malignant neoplasm of bone: Secondary | ICD-10-CM | POA: Insufficient documentation

## 2021-07-12 DIAGNOSIS — I10 Essential (primary) hypertension: Secondary | ICD-10-CM | POA: Insufficient documentation

## 2021-07-12 DIAGNOSIS — R011 Cardiac murmur, unspecified: Secondary | ICD-10-CM | POA: Diagnosis not present

## 2021-07-12 HISTORY — DX: Angina pectoris, unspecified: I20.9

## 2021-07-12 LAB — TYPE AND SCREEN
ABO/RH(D): A POS
Antibody Screen: NEGATIVE

## 2021-07-12 LAB — GLUCOSE, CAPILLARY: Glucose-Capillary: 98 mg/dL (ref 70–99)

## 2021-07-12 LAB — PROTIME-INR
INR: 1 (ref 0.8–1.2)
Prothrombin Time: 12.7 seconds (ref 11.4–15.2)

## 2021-07-12 LAB — HEMOGLOBIN A1C
Hgb A1c MFr Bld: 7 % — ABNORMAL HIGH (ref 4.8–5.6)
Mean Plasma Glucose: 154.2 mg/dL

## 2021-07-12 LAB — SURGICAL PCR SCREEN
MRSA, PCR: NEGATIVE
Staphylococcus aureus: NEGATIVE

## 2021-07-12 NOTE — Progress Notes (Signed)
PCP - Leanna Battles, MD Cardiologist - Shelva Majestic, MD  PPM/ICD - denies Device Orders - n/a Rep Notified - n/a  Chest x-ray - 11/26/2020 EKG - 06/27/2021 Stress Test - 05/13/2015 ECHO - 09/26/2019 Cardiac Cath - 2012 - CE  Sleep Study - denies CPAP - n/a  Fasting Blood Sugar - patient is not checking CBG at home CBG today - 98 A1C - done in PAT on 07/12/2021  Blood Thinner Instructions: n/a  Aspirin Instructions: Patient was instructed: As of today, STOP taking any Aspirin (unless otherwise instructed by your surgeon) Aleve, Naproxen, Ibuprofen, Motrin, Advil, Goody's, BC's, all herbal medications, fish oil, and all vitamins.   ERAS Protcol - n/a   COVID TEST- the test will be done on 07/15/2021 @ 09:30 o'clock. Patient verbalized understanding.   Anesthesia review: yes - cardiac history; in treatment for Lung cancer  Patient denies shortness of breath, fever, cough and chest pain at PAT appointment   All instructions explained to the patient, with a verbal understanding of the material. Patient agrees to go over the instructions while at home for a better understanding. Patient also instructed to self quarantine after being tested for COVID-19. The opportunity to ask questions was provided.

## 2021-07-13 ENCOUNTER — Encounter (HOSPITAL_COMMUNITY): Payer: Self-pay

## 2021-07-13 ENCOUNTER — Encounter: Payer: Self-pay | Admitting: Oncology

## 2021-07-13 NOTE — Anesthesia Preprocedure Evaluation (Addendum)
Anesthesia Evaluation  Patient identified by MRN, date of birth, ID band Patient awake    Reviewed: Allergy & Precautions, NPO status , Patient's Chart, lab work & pertinent test results, reviewed documented beta blocker date and time   History of Anesthesia Complications Negative for: history of anesthetic complications  Airway Mallampati: III  TM Distance: >3 FB Neck ROM: Full    Dental  (+) Dental Advisory Given, Partial Lower, Partial Upper   Pulmonary former smoker,   Lung cancer with mets to bone s/p chemotx    Pulmonary exam normal        Cardiovascular hypertension, Pt. on home beta blockers and Pt. on medications + CAD and + Cardiac Stents  Normal cardiovascular exam   '21 TTE - EF 60 to 65%. There is moderate asymmetric left ventricular hypertrophy of the basal-septal segment.  No SAM or LVOT obstruction noted. Grade I diastolic dysfunction (impaired relaxation). There is mildly elevated pulmonary artery systolic pressure. Trivial MR    Neuro/Psych PSYCHIATRIC DISORDERS Anxiety  Neuromuscular disease (RLS)    GI/Hepatic Neg liver ROS, hiatal hernia,   Endo/Other  diabetes, Type 2, Oral Hypoglycemic Agents Obesity   Renal/GU negative Renal ROS     Musculoskeletal  (+) Arthritis ,   Abdominal   Peds  Hematology negative hematology ROS (+)   Anesthesia Other Findings See PAT note  Reproductive/Obstetrics                           Anesthesia Physical Anesthesia Plan  ASA: 3  Anesthesia Plan: General   Post-op Pain Management: Tylenol PO (pre-op)   Induction: Intravenous  PONV Risk Score and Plan: 4 or greater and Treatment may vary due to age or medical condition, Ondansetron and Aprepitant  Airway Management Planned: Oral ETT  Additional Equipment: None  Intra-op Plan:   Post-operative Plan: Extubation in OR  Informed Consent: I have reviewed the patients History  and Physical, chart, labs and discussed the procedure including the risks, benefits and alternatives for the proposed anesthesia with the patient or authorized representative who has indicated his/her understanding and acceptance.     Dental advisory given  Plan Discussed with: CRNA and Anesthesiologist  Anesthesia Plan Comments:       Anesthesia Quick Evaluation

## 2021-07-13 NOTE — Progress Notes (Signed)
Anesthesia Chart Review: ° Case: [id number] Date/Time: 07/18/21 0715  ° Procedure: Laminectomy and Foraminotomy - L4-L5 - L5-S1 - right, posterolateral fusion L4-5 with interspinous plate (Right: Back)  ° Anesthesia type: General  ° Pre-op diagnosis: Spondylolisthesis  ° Location: [facility] ROOM 20 / [facility] OR  ° Surgeons: Jones, David S, MD  ° °  ° ° °DISCUSSION: Patient is an 86 year old female scheduled for the above procedure. ° °History includes former smoker, CAD (s/p BMS RCA 10/07/10; patent RCA stent, 50-60% small D1, medical treatment 02/02/11; history of stable angina), murmur (trivial MR 09/2019), HLD, HTN, DM2 (A1c 7.0% 07/12/21), thyroid nodules, hiatal hernia, spinal surgery (C5-6 ACDF 07/30/13), lung cancer (non-small cell lung cancer LLL diagnosed 05/26/19 with L1/sacroiliac metastatic lesions, s/p chemoradiation; right IJ PowerPort 06/04/19), COVID-19 (03/30/20, s/p monoclonal antibody treatment 04/06/20).  ° °She had routine cardiology follow-up and preoperative cardiology evaluation by Dr. Kelly on 06/27/2021.  He wrote, "Due to progressive back discomfort with sciatica symptoms down her right leg she is scheduled to undergo L4-L5; L5-S1 laminectomy and foraminotomy with interspinous plate by Dr. David Jones.  With her clinical stability, I have given her clearance to undergo the surgical procedure.  She is no longer on aspirin therapy." Six month follow-up is planned. °   °She last saw oncologist Dr. Sherrill on 07/01/2021.  She received Keytruda.  He is aware of upcoming lumbar spine surgery.  She received her Pfizer COVID-19 vaccine booster that day as well.   ° °Preoperative COVID-19 testing is scheduled for 07/15/2021. Anesthesia team to evaluate on the day of surgery. ° ° °VS: BP (!) 141/61    Pulse 60    Temp 36.5 °C (Oral)    Resp 18    SpO2 99%  ° ° °PROVIDERS: °Paterson, Daniel, MD is PCP  °Thomas, Kelly, MD is cardiologist  °Sherrill, Gary, MD is HEM-ONC. Last visit 07/01/21.  °Moody, John, MD is RAD-ONC.  Last visit 04/30/21 with Perkins, Alison, PA-C. ° ° °LABS: Lab results as of 07/01/21 and 07/12/21 included: °BP Readings from Last 3 Encounters:  °07/12/21 (!) 141/61  °07/01/21 (!) 126/55  °07/01/21 (!) 145/73  ° °Pulse Readings from Last 3 Encounters:  °07/12/21 60  °07/01/21 60  °07/01/21 60  °  ° °IMAGES: °PET Scan 06/01/21: °IMPRESSION: °1. No significant change in size or degree of FDG uptake associated °with FDG avid left lower lobe lung nodule compatible with neoplasm. °2. Stable to improved appearance of multifocal FDG avid bone °metastasis. °  °MRI T-spine 05/03/21 (Canopy/PACS): °IMPRESSION: °1. Subcentimeter enhancing metastatic lesion within the T10 °vertebral body. Additional enhancing lesion in the T4 spinous °process. °2. Slight interval enlargement of enhancing 2.0 cm lesion at the °superior endplate of L1. °3. No pathologic fractures or extraosseous soft tissue component. °4. No significant foraminal or canal stenosis at any level. °5. Lobulated 2.4 x 1.8 cm mass in the posterior aspect of the left °lower lobe, grossly unchanged from PET-CT 01/03/2021 when accounting °for differences in technique. ° °MRI L-spine 03/02/21 (Canopy/PACS): °IMPRESSION: °1. Unchanged examination of the lumbar spine with mild right L5-S1 °neural foraminal stenosis. °2. Unchanged L4-5 and L5-S1 facet arthrosis with grade 1 °anterolisthesis at L4-5. °3. Unchanged superior L1 endplate lesion, favored to be a Schmorl's °node. °4. Unchanged appearance of small low T1/T2-weighted signal lesions °of the right iliac bone and left sacral wing, which were both °hypermetabolic on the 01/03/2021 PET CT and could be small °metastases. ° °CT chest/abd/pelvis 12/15/20: °IMPRESSION: °Chest Impression: °1.   Stable lobular nodule in the LEFT lower lobe. °2. No mediastinal lymphadenopathy. °3. Coronary artery calcification and Aortic Atherosclerosis °(ICD10-I70.0). °Abdomen / Pelvis Impression: °1. No evidence of malignancy in the abdomen  pelvis. °2. Stable sclerotic lesion L2. ° ° °EKG: 06/27/21: °Sinus bradycardia with 1st degree AV block °Left axis deviation °Minimal voltage criteria for LVH, may be normal variant °Septal infarct, age undetermined ° ° °CV: °Echo 09/26/19: °IMPRESSIONS  ° 1. Left ventricular ejection fraction, by estimation, is 60 to 65%. The  °left ventricle has normal function. The left ventricle has no regional  °wall motion abnormalities. There is moderate asymmetric left ventricular  °hypertrophy of the basal-septal  °segment. No SAM or LVOT obstruction noted. Left ventricular diastolic  °parameters are consistent with Grade I diastolic dysfunction (impaired  °relaxation).  ° 2. Right ventricular systolic function is normal. The right ventricular  °size is normal. There is mildly elevated pulmonary artery systolic  °pressure. The estimated right ventricular systolic pressure is 30.2 mmHg.  ° 3. The mitral valve is normal in structure and function. Trivial mitral  °valve regurgitation. No evidence of mitral stenosis.  ° 4. The aortic valve is tricuspid. Aortic valve regurgitation is not  °visualized. No aortic stenosis is present.  ° 5. The inferior vena cava is normal in size with greater than 50%  °respiratory variability, suggesting right atrial pressure of 3 mmHg.  ° ° °Nuclear exercise stress test 05/13/15: °Nuclear stress EF: 70%. °The left ventricular ejection fraction is hyperdynamic (>65%). °There was no ST segment deviation noted during stress. °The study is normal. °This is a low risk study. °Normal exercise nuclear study demonstrating normal myocardial perfusion and function; EF 70%. The patient exercised to a 7 met workload, without chest pain or ECG changes of ischemia. There were rare PVC's and PAC's during stress. Duke treadmill score 6. ° ° °Cardiac cath 02/02/11: °FINDINGS: °1. Left main - no disease. °2. LAD - mild luminal irregularities. °3. LCX - no significant disease. °4. RCA - patent mid RCA stent. °5.  LV-gram - EF 55-60%, no wall motion abnormalities, no mitral °    regurgitation. °6. LVEDP = 6 mmHg. ° °IMPRESSION: °1. Patent mid right coronary artery stent. °2. 50-60% ostial diagonal 1 disease, stable, however, this is a small °    branch. °3. Ejection fraction 60%, no wall motion abnormalities. °4. Left ventricular end-diastolic pressure = 6 mmHg. °  °PLAN:  We will plan to pursue medical therapy of coronary artery °disease.  This may represent some arterial spasm or chest pain secondary °to intermittent hypertension.  She may benefit from a long-acting °nitrate. ° ° °Past Medical History:  °Diagnosis Date  ° Aneurysm (HCC)   ° Right eye a non DES stent was placed so that she would not required long term dual antiplatelet therapy.  ° Anginal pain (HCC)   ° Anxiety   ° not currently taking any meds  ° Arthritis   ° CAD (coronary artery disease)   ° Diabetes mellitus without complication (HCC)   ° H/O hiatal hernia   ° Heart murmur   ° History of stress test 03/31/2012  ° The post stress myocardial perfusion images show a normal pattern of perfusion in all region. The post left ventricles is normal in size. There is no scintigraphic of inductible myocardial ischemia. The post EF is 73  ° Hx of echocardiogram 11/29/2010  ° Ef 67% Normal size chambes, Aortic valve sclerosis without stenosis, No other significant valvular abnormalities, No percardial effusion.  °   Hyperlipemia    Hypertension    Insulin resistance    Lung cancer (HCC)    Multiple thyroid nodules    Neuromuscular disorder (HCC)    nerve pain after shingles   Osteoporosis    Restless legs    Shingles     Past Surgical History:  Procedure Laterality Date   ABDOMINAL HYSTERECTOMY  1970   ANGIOPLASTY     Stenting of a 90% eccentric right coronary artery stenosis and had a 3.0x15 mm Integrity bare-metal stent inserted   ANTERIOR CERVICAL DECOMP/DISCECTOMY FUSION N/A 07/30/2013   Procedure: ANTERIOR CERVICAL DECOMPRESSION/DISCECTOMY FUSION  CERVICAL FIVE -SIX;  Surgeon: Eustace Moore, MD;  Location: [facility];  Service: Neurosurgery;  Laterality: N/A;   APPENDECTOMY     BREAST SURGERY Left    cysts removed from left breast (in her 20'2)   CARDIAC CATHETERIZATION     Showed a widely patent stent, she did have 60% ostial diagonal-1 stenosis, She also had mild luminal irregularities of her LAD.   COLONOSCOPY     EYE SURGERY Bilateral 2009   cateract surgery- bilateral   IR IMAGING GUIDED PORT INSERTION  06/04/2019   REFRACTIVE SURGERY Left 2011   TONSILLECTOMY      MEDICATIONS:  ALPRAZolam (XANAX) 0.5 MG tablet   amLODipine (NORVASC) 2.5 MG tablet   carboxymethylcellulose (REFRESH PLUS) 0.5 % SOLN   chlorpheniramine (CHLOR-TRIMETON) 4 MG tablet   COVID-19 mRNA bivalent vaccine, Pfizer, (PFIZER COVID-19 VAC BIVALENT) injection   COVID-19 mRNA vaccine, Pfizer, 30 MCG/0.3ML injection   diclofenac Sodium (VOLTAREN) 1 % GEL   gabapentin (NEURONTIN) 300 MG capsule   hydrochlorothiazide (MICROZIDE) 12.5 MG capsule   HYDROcodone-acetaminophen (NORCO) 5-325 MG tablet   influenza vaccine adjuvanted (FLUAD) 0.5 ML injection   isosorbide mononitrate (IMDUR) 30 MG 24 hr tablet   lidocaine (LIDODERM) 5 %   lisinopril (ZESTRIL) 10 MG tablet   Magnesium 250 MG TABS   metFORMIN (GLUCOPHAGE) 500 MG tablet   metoprolol tartrate (LOPRESSOR) 25 MG tablet   Multiple Vitamins-Minerals (CENTRUM SILVER PO)   nitroGLYCERIN (NITROSTAT) 0.4 MG SL tablet   ondansetron (ZOFRAN) 8 MG tablet   polyethylene glycol (MIRALAX / GLYCOLAX) packet   rOPINIRole (REQUIP) 1 MG tablet   rosuvastatin (CRESTOR) 40 MG tablet   traZODone (DESYREL) 100 MG tablet   Vitamin D, Ergocalciferol, (DRISDOL) 50000 UNITS CAPS capsule   No current facility-administered medications for this encounter.    Myra Gianotti, PA-C Surgical Short Stay/Anesthesiology [facility] Phone [phone] [facility] Phone [phone] 07/13/2021 6:06 PM

## 2021-07-15 ENCOUNTER — Other Ambulatory Visit (HOSPITAL_COMMUNITY)
Admission: RE | Admit: 2021-07-15 | Discharge: 2021-07-15 | Disposition: A | Discharge status: Home / Self Care | Payer: Medicare Other | Source: Ambulatory Visit | Attending: Neurological Surgery | Admitting: Neurological Surgery

## 2021-07-15 DIAGNOSIS — Z20822 Contact with and (suspected) exposure to covid-19: Secondary | ICD-10-CM | POA: Diagnosis not present

## 2021-07-15 DIAGNOSIS — Z01812 Encounter for preprocedural laboratory examination: Secondary | ICD-10-CM | POA: Diagnosis present

## 2021-07-15 DIAGNOSIS — Z01818 Encounter for other preprocedural examination: Secondary | ICD-10-CM

## 2021-07-15 LAB — SARS CORONAVIRUS 2 (TAT 6-24 HRS): SARS Coronavirus 2: NEGATIVE

## 2021-07-16 ENCOUNTER — Other Ambulatory Visit: Payer: Self-pay

## 2021-07-17 ENCOUNTER — Other Ambulatory Visit: Payer: Self-pay | Admitting: Oncology

## 2021-07-18 ENCOUNTER — Encounter (HOSPITAL_COMMUNITY)
Admission: RE | Disposition: A | Discharge status: Home / Self Care | Payer: Self-pay | Source: Home / Self Care | Attending: Neurological Surgery

## 2021-07-18 ENCOUNTER — Ambulatory Visit (HOSPITAL_COMMUNITY): Payer: Medicare Other

## 2021-07-18 ENCOUNTER — Other Ambulatory Visit: Payer: Self-pay

## 2021-07-18 ENCOUNTER — Encounter (HOSPITAL_COMMUNITY): Payer: Self-pay | Admitting: Neurological Surgery

## 2021-07-18 ENCOUNTER — Observation Stay (HOSPITAL_COMMUNITY)
Admission: RE | Admit: 2021-07-18 | Discharge: 2021-07-18 | Disposition: A | Discharge status: Home / Self Care | Payer: Medicare Other | Attending: Neurological Surgery | Admitting: Neurological Surgery

## 2021-07-18 ENCOUNTER — Ambulatory Visit (HOSPITAL_COMMUNITY): Payer: Medicare Other | Admitting: Vascular Surgery

## 2021-07-18 DIAGNOSIS — M4316 Spondylolisthesis, lumbar region: Secondary | ICD-10-CM | POA: Insufficient documentation

## 2021-07-18 DIAGNOSIS — Z85118 Personal history of other malignant neoplasm of bronchus and lung: Secondary | ICD-10-CM | POA: Insufficient documentation

## 2021-07-18 DIAGNOSIS — Z7984 Long term (current) use of oral hypoglycemic drugs: Secondary | ICD-10-CM | POA: Insufficient documentation

## 2021-07-18 DIAGNOSIS — Z79899 Other long term (current) drug therapy: Secondary | ICD-10-CM | POA: Diagnosis not present

## 2021-07-18 DIAGNOSIS — M48062 Spinal stenosis, lumbar region with neurogenic claudication: Secondary | ICD-10-CM | POA: Diagnosis present

## 2021-07-18 DIAGNOSIS — C3492 Malignant neoplasm of unspecified part of left bronchus or lung: Secondary | ICD-10-CM

## 2021-07-18 DIAGNOSIS — Z419 Encounter for procedure for purposes other than remedying health state, unspecified: Secondary | ICD-10-CM

## 2021-07-18 DIAGNOSIS — Z87891 Personal history of nicotine dependence: Secondary | ICD-10-CM | POA: Insufficient documentation

## 2021-07-18 DIAGNOSIS — Z9889 Other specified postprocedural states: Secondary | ICD-10-CM

## 2021-07-18 DIAGNOSIS — I10 Essential (primary) hypertension: Secondary | ICD-10-CM | POA: Diagnosis not present

## 2021-07-18 DIAGNOSIS — I2510 Atherosclerotic heart disease of native coronary artery without angina pectoris: Secondary | ICD-10-CM | POA: Diagnosis not present

## 2021-07-18 DIAGNOSIS — E119 Type 2 diabetes mellitus without complications: Secondary | ICD-10-CM | POA: Diagnosis not present

## 2021-07-18 HISTORY — PX: LAMINECTOMY WITH POSTERIOR LATERAL ARTHRODESIS LEVEL 2: SHX6336

## 2021-07-18 LAB — GLUCOSE, CAPILLARY
Glucose-Capillary: 142 mg/dL — ABNORMAL HIGH (ref 70–99)
Glucose-Capillary: 151 mg/dL — ABNORMAL HIGH (ref 70–99)
Glucose-Capillary: 252 mg/dL — ABNORMAL HIGH (ref 70–99)

## 2021-07-18 LAB — ABO/RH: ABO/RH(D): A POS

## 2021-07-18 SURGERY — LAMINECTOMY WITH POSTERIOR LATERAL ARTHRODESIS LEVEL 2
Anesthesia: General | Site: Back | Laterality: Right

## 2021-07-18 MED ORDER — THROMBIN 20000 UNITS EX SOLR
CUTANEOUS | Status: AC
  Filled 2021-07-18: qty 20000

## 2021-07-18 MED ORDER — ONDANSETRON HCL 4 MG/2ML IJ SOLN
INTRAMUSCULAR | Status: DC | PRN
  Administered 2021-07-18: 4 mg via INTRAVENOUS

## 2021-07-18 MED ORDER — FENTANYL CITRATE (PF) 250 MCG/5ML IJ SOLN
INTRAMUSCULAR | Status: AC
  Filled 2021-07-18: qty 5

## 2021-07-18 MED ORDER — TRAZODONE HCL 50 MG PO TABS: 100.0000 mg | ORAL_TABLET | Freq: Every day | ORAL | Status: DC

## 2021-07-18 MED ORDER — FENTANYL CITRATE (PF) 100 MCG/2ML IJ SOLN
25.0000 ug | INTRAMUSCULAR | Status: DC | PRN
  Administered 2021-07-18 (×4): 25 ug via INTRAVENOUS

## 2021-07-18 MED ORDER — SENNA 8.6 MG PO TABS: 1.0000 | ORAL_TABLET | Freq: Two times a day (BID) | ORAL | Status: DC

## 2021-07-18 MED ORDER — SUGAMMADEX SODIUM 200 MG/2ML IV SOLN
INTRAVENOUS | Status: DC | PRN
  Administered 2021-07-18: 400 mg via INTRAVENOUS

## 2021-07-18 MED ORDER — PHENOL 1.4 % MT LIQD: 1.0000 | OROMUCOSAL | Status: DC | PRN

## 2021-07-18 MED ORDER — VANCOMYCIN HCL 1000 MG IV SOLR
INTRAVENOUS | Status: AC
  Filled 2021-07-18: qty 20

## 2021-07-18 MED ORDER — DEXAMETHASONE SODIUM PHOSPHATE 10 MG/ML IJ SOLN
INTRAMUSCULAR | Status: AC
  Filled 2021-07-18: qty 1

## 2021-07-18 MED ORDER — APREPITANT 40 MG PO CAPS
40.0000 mg | ORAL_CAPSULE | Freq: Once | ORAL | Status: AC
  Administered 2021-07-18: 06:00:00 40 mg via ORAL
  Filled 2021-07-18: qty 1

## 2021-07-18 MED ORDER — AMLODIPINE BESYLATE 5 MG PO TABS: 2.5000 mg | ORAL_TABLET | Freq: Every day | ORAL | Status: DC

## 2021-07-18 MED ORDER — ISOSORBIDE MONONITRATE ER 30 MG PO TB24: 30.0000 mg | ORAL_TABLET | Freq: Every day | ORAL | Status: DC

## 2021-07-18 MED ORDER — LIDOCAINE 2% (20 MG/ML) 5 ML SYRINGE
INTRAMUSCULAR | Status: DC | PRN
  Administered 2021-07-18: 60 mg via INTRAVENOUS

## 2021-07-18 MED ORDER — ONDANSETRON HCL 4 MG PO TABS: 4.0000 mg | ORAL_TABLET | Freq: Four times a day (QID) | ORAL | Status: DC | PRN

## 2021-07-18 MED ORDER — ORAL CARE MOUTH RINSE: 15.0000 mL | Freq: Once | OROMUCOSAL | Status: DC

## 2021-07-18 MED ORDER — EPHEDRINE SULFATE-NACL 50-0.9 MG/10ML-% IV SOSY
PREFILLED_SYRINGE | INTRAVENOUS | Status: DC | PRN
  Administered 2021-07-18 (×3): 5 mg via INTRAVENOUS

## 2021-07-18 MED ORDER — INSULIN ASPART 100 UNIT/ML IJ SOLN
0.0000 [IU] | INTRAMUSCULAR | Status: DC
Start: 2021-07-18 — End: 2021-07-18

## 2021-07-18 MED ORDER — MORPHINE SULFATE (PF) 2 MG/ML IV SOLN: 2.0000 mg | INTRAVENOUS | Status: DC | PRN

## 2021-07-18 MED ORDER — FENTANYL CITRATE (PF) 100 MCG/2ML IJ SOLN
INTRAMUSCULAR | Status: AC
  Filled 2021-07-18: qty 2

## 2021-07-18 MED ORDER — ACETAMINOPHEN 500 MG PO TABS
1000.0000 mg | ORAL_TABLET | Freq: Once | ORAL | Status: AC
  Administered 2021-07-18: 06:00:00 1000 mg via ORAL
  Filled 2021-07-18: qty 2

## 2021-07-18 MED ORDER — LISINOPRIL 10 MG PO TABS: 10.0000 mg | ORAL_TABLET | Freq: Every day | ORAL | Status: DC

## 2021-07-18 MED ORDER — DEXAMETHASONE SODIUM PHOSPHATE 10 MG/ML IJ SOLN
10.0000 mg | Freq: Once | INTRAMUSCULAR | Status: DC
  Filled 2021-07-18: qty 1

## 2021-07-18 MED ORDER — BUPIVACAINE HCL (PF) 0.25 % IJ SOLN
INTRAMUSCULAR | Status: AC
  Filled 2021-07-18: qty 30

## 2021-07-18 MED ORDER — EPHEDRINE 5 MG/ML INJ
INTRAVENOUS | Status: AC
  Filled 2021-07-18: qty 5

## 2021-07-18 MED ORDER — DEXAMETHASONE SODIUM PHOSPHATE 10 MG/ML IJ SOLN
INTRAMUSCULAR | Status: DC | PRN
  Administered 2021-07-18: 10 mg via INTRAVENOUS

## 2021-07-18 MED ORDER — METHOCARBAMOL 1000 MG/10ML IJ SOLN
500.0000 mg | Freq: Four times a day (QID) | INTRAVENOUS | Status: DC | PRN
  Filled 2021-07-18: qty 5

## 2021-07-18 MED ORDER — THROMBIN 20000 UNITS EX SOLR
OROMUCOSAL | Status: DC | PRN
  Administered 2021-07-18: 08:00:00 20 mL via TOPICAL

## 2021-07-18 MED ORDER — THROMBIN 5000 UNITS EX SOLR
CUTANEOUS | Status: AC
  Filled 2021-07-18: qty 5000

## 2021-07-18 MED ORDER — BUPIVACAINE HCL (PF) 0.25 % IJ SOLN
INTRAMUSCULAR | Status: DC | PRN
  Administered 2021-07-18: 16 mL
  Administered 2021-07-18: 4 mL

## 2021-07-18 MED ORDER — PROPOFOL 10 MG/ML IV BOLUS
INTRAVENOUS | Status: DC | PRN
  Administered 2021-07-18: 80 mg via INTRAVENOUS
  Administered 2021-07-18: 50 mg via INTRAVENOUS

## 2021-07-18 MED ORDER — CEFAZOLIN SODIUM-DEXTROSE 2-4 GM/100ML-% IV SOLN
2.0000 g | Freq: Three times a day (TID) | INTRAVENOUS | Status: DC
  Administered 2021-07-18: 15:00:00 2 g via INTRAVENOUS
  Filled 2021-07-18: qty 100

## 2021-07-18 MED ORDER — INSULIN ASPART 100 UNIT/ML IJ SOLN
0.0000 [IU] | Freq: Three times a day (TID) | INTRAMUSCULAR | Status: DC
  Administered 2021-07-18: 19:00:00 8 [IU] via SUBCUTANEOUS

## 2021-07-18 MED ORDER — LACTATED RINGERS IV SOLN: INTRAVENOUS | Status: DC

## 2021-07-18 MED ORDER — ROCURONIUM BROMIDE 10 MG/ML (PF) SYRINGE
PREFILLED_SYRINGE | INTRAVENOUS | Status: AC
  Filled 2021-07-18: qty 10

## 2021-07-18 MED ORDER — METFORMIN HCL 500 MG PO TABS: 500.0000 mg | ORAL_TABLET | Freq: Every day | ORAL | Status: DC

## 2021-07-18 MED ORDER — CHLORHEXIDINE GLUCONATE 0.12 % MT SOLN
15.0000 mL | Freq: Once | OROMUCOSAL | Status: DC
  Filled 2021-07-18: qty 15

## 2021-07-18 MED ORDER — SODIUM CHLORIDE 0.9% FLUSH: 3.0000 mL | INTRAVENOUS | Status: DC | PRN

## 2021-07-18 MED ORDER — METOPROLOL TARTRATE 25 MG PO TABS: 25.0000 mg | ORAL_TABLET | Freq: Every day | ORAL | Status: DC

## 2021-07-18 MED ORDER — METHOCARBAMOL 500 MG PO TABS
500.0000 mg | ORAL_TABLET | Freq: Four times a day (QID) | ORAL | Status: DC | PRN
  Administered 2021-07-18: 14:00:00 500 mg via ORAL
  Filled 2021-07-18: qty 1

## 2021-07-18 MED ORDER — PROPOFOL 10 MG/ML IV BOLUS
INTRAVENOUS | Status: AC
  Filled 2021-07-18: qty 20

## 2021-07-18 MED ORDER — ONDANSETRON HCL 4 MG/2ML IJ SOLN
INTRAMUSCULAR | Status: AC
  Filled 2021-07-18: qty 2

## 2021-07-18 MED ORDER — CHLORHEXIDINE GLUCONATE CLOTH 2 % EX PADS: 6.0000 | MEDICATED_PAD | Freq: Once | CUTANEOUS | Status: DC

## 2021-07-18 MED ORDER — GABAPENTIN 300 MG PO CAPS: 300.0000 mg | ORAL_CAPSULE | Freq: Every day | ORAL | Status: DC

## 2021-07-18 MED ORDER — HYDROCODONE-ACETAMINOPHEN 5-325 MG PO TABS
1.0000 | ORAL_TABLET | Freq: Four times a day (QID) | ORAL | Status: DC | PRN
Start: 2021-07-18 — End: 2021-07-19
  Administered 2021-07-18: 14:00:00 2 via ORAL
  Filled 2021-07-18: qty 2

## 2021-07-18 MED ORDER — CEFAZOLIN SODIUM-DEXTROSE 2-4 GM/100ML-% IV SOLN
2.0000 g | INTRAVENOUS | Status: AC
  Administered 2021-07-18: 08:00:00 2 g via INTRAVENOUS
  Filled 2021-07-18: qty 100

## 2021-07-18 MED ORDER — ACETAMINOPHEN 325 MG PO TABS: 650.0000 mg | ORAL_TABLET | ORAL | Status: DC | PRN

## 2021-07-18 MED ORDER — HYDROCODONE-ACETAMINOPHEN 5-325 MG PO TABS: 1.0000 | ORAL_TABLET | Freq: Four times a day (QID) | ORAL | 0 refills | Status: DC | PRN

## 2021-07-18 MED ORDER — OXYCODONE HCL 5 MG PO TABS: 5.0000 mg | ORAL_TABLET | Freq: Once | ORAL | Status: DC | PRN

## 2021-07-18 MED ORDER — ACETAMINOPHEN 500 MG PO TABS: 1000.0000 mg | ORAL_TABLET | ORAL | Status: DC

## 2021-07-18 MED ORDER — OXYCODONE HCL 5 MG/5ML PO SOLN: 5.0000 mg | Freq: Once | ORAL | Status: DC | PRN

## 2021-07-18 MED ORDER — PHENYLEPHRINE HCL-NACL 20-0.9 MG/250ML-% IV SOLN
INTRAVENOUS | Status: DC | PRN
  Administered 2021-07-18: 50 ug/min via INTRAVENOUS

## 2021-07-18 MED ORDER — 0.9 % SODIUM CHLORIDE (POUR BTL) OPTIME
TOPICAL | Status: DC | PRN
  Administered 2021-07-18: 08:00:00 1000 mL

## 2021-07-18 MED ORDER — GABAPENTIN 300 MG PO CAPS
300.0000 mg | ORAL_CAPSULE | ORAL | Status: AC
  Administered 2021-07-18: 06:00:00 300 mg via ORAL
  Filled 2021-07-18: qty 1

## 2021-07-18 MED ORDER — MENTHOL 3 MG MT LOZG: 1.0000 | LOZENGE | OROMUCOSAL | Status: DC | PRN

## 2021-07-18 MED ORDER — SODIUM CHLORIDE 0.9 % IV SOLN
250.0000 mL | INTRAVENOUS | Status: DC
  Administered 2021-07-18: 14:00:00 250 mL via INTRAVENOUS

## 2021-07-18 MED ORDER — ROPINIROLE HCL 1 MG PO TABS: 1.0000 mg | ORAL_TABLET | Freq: Three times a day (TID) | ORAL | Status: DC

## 2021-07-18 MED ORDER — LIDOCAINE 2% (20 MG/ML) 5 ML SYRINGE
INTRAMUSCULAR | Status: AC
  Filled 2021-07-18: qty 5

## 2021-07-18 MED ORDER — POTASSIUM CHLORIDE IN NACL 20-0.9 MEQ/L-% IV SOLN: INTRAVENOUS | Status: DC

## 2021-07-18 MED ORDER — ONDANSETRON HCL 4 MG/2ML IJ SOLN: 4.0000 mg | Freq: Once | INTRAMUSCULAR | Status: DC | PRN

## 2021-07-18 MED ORDER — THROMBIN 5000 UNITS EX SOLR
OROMUCOSAL | Status: DC | PRN
  Administered 2021-07-18: 08:00:00 5 mL via TOPICAL

## 2021-07-18 MED ORDER — ONDANSETRON HCL 4 MG/2ML IJ SOLN: 4.0000 mg | Freq: Four times a day (QID) | INTRAMUSCULAR | Status: DC | PRN

## 2021-07-18 MED ORDER — ACETAMINOPHEN 650 MG RE SUPP: 650.0000 mg | RECTAL | Status: DC | PRN

## 2021-07-18 MED ORDER — SODIUM CHLORIDE 0.9% FLUSH
3.0000 mL | Freq: Two times a day (BID) | INTRAVENOUS | Status: DC
  Administered 2021-07-18: 14:00:00 3 mL via INTRAVENOUS

## 2021-07-18 MED ORDER — ROCURONIUM BROMIDE 10 MG/ML (PF) SYRINGE
PREFILLED_SYRINGE | INTRAVENOUS | Status: DC | PRN
  Administered 2021-07-18 (×2): 10 mg via INTRAVENOUS
  Administered 2021-07-18: 50 mg via INTRAVENOUS

## 2021-07-18 MED ORDER — FENTANYL CITRATE (PF) 250 MCG/5ML IJ SOLN
INTRAMUSCULAR | Status: DC | PRN
  Administered 2021-07-18 (×3): 50 ug via INTRAVENOUS

## 2021-07-18 MED ORDER — ALPRAZOLAM 0.5 MG PO TABS: 0.5000 mg | ORAL_TABLET | Freq: Two times a day (BID) | ORAL | Status: DC | PRN

## 2021-07-18 SURGICAL SUPPLY — 56 items
BAG COUNTER SPONGE SURGICOUNT (BAG) ×3 IMPLANT
BAG SURGICOUNT SPONGE COUNTING (BAG) ×2
BAND RUBBER #18 3X1/16 STRL (MISCELLANEOUS) IMPLANT
BASKET BONE COLLECTION (BASKET) ×2 IMPLANT
BENZOIN TINCTURE PRP APPL 2/3 (GAUZE/BANDAGES/DRESSINGS) ×3 IMPLANT
BLADE CLIPPER SURG (BLADE) IMPLANT
BUR CARBIDE MATCH 3.0 (BURR) ×3 IMPLANT
CANISTER SUCT 3000ML PPV (MISCELLANEOUS) ×3 IMPLANT
CLOSURE WOUND 1/2 X4 (GAUZE/BANDAGES/DRESSINGS) ×1
CNTNR URN SCR LID CUP LEK RST (MISCELLANEOUS) ×1 IMPLANT
CONT SPEC 4OZ STRL OR WHT (MISCELLANEOUS) ×3
COVER BACK TABLE 60X90IN (DRAPES) ×3 IMPLANT
DERMABOND ADVANCED (GAUZE/BANDAGES/DRESSINGS) ×2
DERMABOND ADVANCED .7 DNX12 (GAUZE/BANDAGES/DRESSINGS) IMPLANT
DRAPE C-ARM 42X72 X-RAY (DRAPES) ×6 IMPLANT
DRAPE LAPAROTOMY 100X72X124 (DRAPES) ×3 IMPLANT
DRAPE MICROSCOPE LEICA (MISCELLANEOUS) IMPLANT
DRAPE SURG 17X23 STRL (DRAPES) ×3 IMPLANT
DURAPREP 26ML APPLICATOR (WOUND CARE) ×3 IMPLANT
ELECT REM PT RETURN 9FT ADLT (ELECTROSURGICAL) ×3
ELECTRODE REM PT RTRN 9FT ADLT (ELECTROSURGICAL) ×1 IMPLANT
EVACUATOR 1/8 PVC DRAIN (DRAIN) IMPLANT
GAUZE 4X4 16PLY ~~LOC~~+RFID DBL (SPONGE) ×2 IMPLANT
GLOVE SURG ENC MOIS LTX SZ7 (GLOVE) ×2 IMPLANT
GLOVE SURG ENC MOIS LTX SZ8 (GLOVE) ×8 IMPLANT
GLOVE SURG ENC MOIS LTX SZ8.5 (GLOVE) ×2 IMPLANT
GLOVE SURG MICRO LTX SZ6.5 (GLOVE) ×2 IMPLANT
GLOVE SURG POLYISO LF SZ6 (GLOVE) ×2 IMPLANT
GLOVE SURG UNDER POLY LF SZ7 (GLOVE) ×2 IMPLANT
GOWN STRL REUS W/ TWL LRG LVL3 (GOWN DISPOSABLE) IMPLANT
GOWN STRL REUS W/ TWL XL LVL3 (GOWN DISPOSABLE) ×2 IMPLANT
GOWN STRL REUS W/TWL 2XL LVL3 (GOWN DISPOSABLE) IMPLANT
GOWN STRL REUS W/TWL LRG LVL3 (GOWN DISPOSABLE) ×6
GOWN STRL REUS W/TWL XL LVL3 (GOWN DISPOSABLE) ×9
HEMOSTAT POWDER KIT SURGIFOAM (HEMOSTASIS) ×2 IMPLANT
KIT BASIN OR (CUSTOM PROCEDURE TRAY) ×3 IMPLANT
KIT TURNOVER KIT B (KITS) ×3 IMPLANT
NDL HYPO 25X1 1.5 SAFETY (NEEDLE) ×1 IMPLANT
NEEDLE HYPO 25X1 1.5 SAFETY (NEEDLE) ×3 IMPLANT
NS IRRIG 1000ML POUR BTL (IV SOLUTION) ×3 IMPLANT
PACK LAMINECTOMY NEURO (CUSTOM PROCEDURE TRAY) ×3 IMPLANT
PAD ARMBOARD 7.5X6 YLW CONV (MISCELLANEOUS) ×7 IMPLANT
PLATE SPINOUS SMALL (Plate) ×2 IMPLANT
PUTTY DBM 10CC (Putty) ×2 IMPLANT
SPONGE SURGIFOAM ABS GEL 100 (HEMOSTASIS) ×3 IMPLANT
SPONGE T-LAP 4X18 ~~LOC~~+RFID (SPONGE) ×2 IMPLANT
STRIP CLOSURE SKIN 1/2X4 (GAUZE/BANDAGES/DRESSINGS) ×3 IMPLANT
SUT VIC AB 0 CT1 18XCR BRD8 (SUTURE) ×1 IMPLANT
SUT VIC AB 0 CT1 8-18 (SUTURE) ×3
SUT VIC AB 2-0 CP2 18 (SUTURE) ×3 IMPLANT
SUT VIC AB 3-0 SH 8-18 (SUTURE) ×6 IMPLANT
TOWEL GREEN STERILE (TOWEL DISPOSABLE) ×3 IMPLANT
TOWEL GREEN STERILE FF (TOWEL DISPOSABLE) ×3 IMPLANT
TRAY FOLEY MTR SLVR 14FR STAT (SET/KITS/TRAYS/PACK) IMPLANT
TRAY FOLEY MTR SLVR 16FR STAT (SET/KITS/TRAYS/PACK) IMPLANT
WATER STERILE IRR 1000ML POUR (IV SOLUTION) ×3 IMPLANT

## 2021-07-18 NOTE — Discharge Instructions (Signed)
Wound Care Keep incision covered and dry for two - three days.   Do not put any creams, lotions, or ointments on incision. Leave steri-strips on back.  They will fall off by themselves.  Activity Walk each and every day, increasing distance each day. No lifting greater than 5 lbs.  Avoid excessive back bending. No driving for 2 weeks; may ride as a passenger locally.  Diet Resume your normal diet.   Call Your Doctor If Any of These Occur Redness, drainage, or swelling at the wound.  Temperature greater than 101 degrees. Severe pain not relieved by pain medication. Incision starts to come apart. Follow Up Appt Call today for appointment in 1-2 weeks ([phone]) or for problems.

## 2021-07-18 NOTE — H&P (Signed)
Subjective: Patient is a 86 y.o. female admitted for stenosis. Onset of symptoms was several years ago, gradually worsening since that time.  The pain is rated severe, and is located at the across the lower back and radiates to RLE. The pain is described as aching and occurs all day. The symptoms have been progressive. Symptoms are exacerbated by exercise, standing, and walking for more than a few minutes. MRI or CT showed stenosis with spondylolisthesis L4-5 L5-s1   Past Medical History:  Diagnosis Date   Aneurysm ([place])    Right eye a non DES stent was placed so that she would not required long term dual antiplatelet therapy.   Anginal pain (HCC)    Anxiety    not currently taking any meds   Arthritis    CAD (coronary artery disease)    Diabetes mellitus without complication ([place])    H/O hiatal hernia    Heart murmur    History of stress test 03/31/2012   The post stress myocardial perfusion images show a normal pattern of perfusion in all region. The post left ventricles is normal in size. There is no scintigraphic of inductible myocardial ischemia. The post EF is 17   Hx of echocardiogram 11/29/2010   Ef 67% Normal size chambes, Aortic valve sclerosis without stenosis, No other significant valvular abnormalities, No percardial effusion.   Hyperlipemia    Hypertension    Insulin resistance    Lung cancer (HCC)    Multiple thyroid nodules    Neuromuscular disorder (HCC)    nerve pain after shingles   Osteoporosis    Restless legs    Shingles     Past Surgical History:  Procedure Laterality Date   ABDOMINAL HYSTERECTOMY  1970   ANGIOPLASTY     Stenting of a 90% eccentric right coronary artery stenosis and had a 3.0x15 mm Integrity bare-metal stent inserted   ANTERIOR CERVICAL DECOMP/DISCECTOMY FUSION N/A 07/30/2013   Procedure: ANTERIOR CERVICAL DECOMPRESSION/DISCECTOMY FUSION CERVICAL FIVE -SIX;  Surgeon: Eustace Moore, MD;  Location: [facility];  Service: Neurosurgery;   Laterality: N/A;   APPENDECTOMY     BREAST SURGERY Left    cysts removed from left breast (in her 20'2)   CARDIAC CATHETERIZATION     Showed a widely patent stent, she did have 60% ostial diagonal-1 stenosis, She also had mild luminal irregularities of her LAD.   COLONOSCOPY     EYE SURGERY Bilateral 2009   cateract surgery- bilateral   IR IMAGING GUIDED PORT INSERTION  06/04/2019   REFRACTIVE SURGERY Left 2011   TONSILLECTOMY      Prior to Admission medications   Medication Sig Start Date End Date Taking? Authorizing Provider  ALPRAZolam Duanne Moron) 0.5 MG tablet Take 0.5 mg by mouth 2 (two) times daily as needed for anxiety. 09/11/13  Yes [provider]  amLODipine (NORVASC) 2.5 MG tablet Take 2.5 mg by mouth daily. 05/23/21  Yes [provider]  carboxymethylcellulose (REFRESH PLUS) 0.5 % SOLN Place 1 drop into both eyes 3 (three) times daily as needed (dry eyes).   Yes [provider]  chlorpheniramine (CHLOR-TRIMETON) 4 MG tablet Take 4 mg by mouth 2 (two) times daily as needed for rhinitis.   Yes [provider]  diclofenac Sodium (VOLTAREN) 1 % GEL Apply 1 application topically at bedtime.   Yes [provider]  gabapentin (NEURONTIN) 300 MG capsule Take 300 mg by mouth at bedtime.    Yes [provider]  hydrochlorothiazide (MICROZIDE) 12.5  MG capsule Take 1 capsule (12.5 mg total) by mouth daily. Patient taking differently: Take 12.5 mg by mouth daily as needed (fluid). 06/27/21 09/25/21 Yes Troy Sine, MD  HYDROcodone-acetaminophen (NORCO) 5-325 MG tablet Take 1-2 tablets by mouth every 6 (six) hours as needed for moderate pain. 07/01/21  Yes Owens Shark, NP  isosorbide mononitrate (IMDUR) 30 MG 24 hr tablet Take 30 mg by mouth daily.   Yes [provider]  lidocaine (LIDODERM) 5 % Place 1 patch onto the skin daily as needed (pain). 06/17/20  Yes [provider]  lisinopril (ZESTRIL) 10 MG tablet TAKE 1  TABLET BY MOUTH  DAILY 05/27/21  Yes Troy Sine, MD  Magnesium 250 MG TABS Take 250 mg by mouth at bedtime.    Yes [provider]  metFORMIN (GLUCOPHAGE) 500 MG tablet Take 500 mg by mouth at bedtime. 12/18/20  Yes [provider]  metoprolol tartrate (LOPRESSOR) 25 MG tablet Take 25 mg by mouth at bedtime.    Yes [provider]  Multiple Vitamins-Minerals (CENTRUM SILVER PO) Take 1 tablet by mouth daily.   Yes [provider]  polyethylene glycol (MIRALAX / GLYCOLAX) packet Take 17 g by mouth every morning.    Yes [provider]  rOPINIRole (REQUIP) 1 MG tablet Take 1 mg by mouth 3 (three) times daily.   Yes [provider]  rosuvastatin (CRESTOR) 40 MG tablet Take 40 mg by mouth at bedtime.    Yes [provider]  traZODone (DESYREL) 100 MG tablet Take 100 mg by mouth at bedtime.    Yes [provider]  Vitamin D, Ergocalciferol, (DRISDOL) 50000 UNITS CAPS capsule Take 50,000 Units by mouth every Sunday.   Yes [provider]  COVID-19 mRNA bivalent vaccine, Pfizer, (PFIZER COVID-19 VAC BIVALENT) injection Inject into the muscle. 07/01/21   Carlyle Basques, MD  COVID-19 mRNA vaccine, Pfizer, 30 MCG/0.3ML injection Inject into the muscle. 11/03/20   Carlyle Basques, MD  influenza vaccine adjuvanted (FLUAD) 0.5 ML injection Inject into the muscle. 05/05/21   Carlyle Basques, MD  nitroGLYCERIN (NITROSTAT) 0.4 MG SL tablet Place 1 tablet (0.4 mg total) under the tongue every 5 (five) minutes as needed for chest pain. 06/27/21   Troy Sine, MD  ondansetron (ZOFRAN) 8 MG tablet Take 1 tablet (8 mg total) by mouth every 8 (eight) hours as needed for nausea or vomiting. 06/10/19   Owens Shark, NP   Allergies  Allergen Reactions   Codeine Nausea And Vomiting and Other (See Comments)    Severe constipation.    Social History   Tobacco Use   Smoking status: Former    Packs/day: 2.00    Years: 33.00    Pack  years: 66.00    Types: Cigarettes   Smokeless tobacco: Never   Tobacco comments:    quit in 1995.  Substance Use Topics   Alcohol use: Yes    Alcohol/week: 0.0 standard drinks    Comment: occasional (maybe twice a year)    Family History  Problem Relation Age of Onset   Alzheimer's disease Mother 56   Colon polyps Mother    Diabetes Mother    Heart attack Father 61   Cancer Maternal Grandmother 76   Breast cancer Maternal Grandmother    Diabetes Sister    Breast cancer Maternal Uncle    Breast cancer Maternal Aunt        x 2   Colon polyps Sister  Colon polyps Maternal Aunt    Colon cancer Neg Hx    Esophageal cancer Neg Hx    Rectal cancer Neg Hx    Stomach cancer Neg Hx      Review of Systems  Positive ROS: neg  All other systems have been reviewed and were otherwise negative with the exception of those mentioned in the HPI and as above.  Objective: Vital signs in last 24 hours: Temp:  [97.5 F (36.4 C)] 97.5 F (36.4 C) (12/19 0555) Pulse Rate:  [64] 64 (12/19 0555) Resp:  [18] 18 (12/19 0555) BP: (134)/(46) 134/46 (12/19 0555) SpO2:  [94 %] 94 % (12/19 0555) Weight:  [83.9 kg] 83.9 kg (12/19 0555)  General Appearance: Alert, cooperative, no distress, appears stated age Head: Normocephalic, without obvious abnormality, atraumatic Eyes: PERRL, conjunctiva/corneas clear, EOM's intact    Neck: Supple, symmetrical, trachea midline Back: Symmetric, no curvature, ROM normal, no CVA tenderness Lungs:  respirations unlabored Heart: Regular rate and rhythm Abdomen: Soft, non-tender Extremities: Extremities normal, atraumatic, no cyanosis or edema Pulses: 2+ and symmetric all extremities Skin: Skin color, texture, turgor normal, no rashes or lesions  NEUROLOGIC:   Mental status: Alert and oriented x4,  no aphasia, good attention span, fund of knowledge, and memory Motor Exam - grossly normal Sensory Exam - grossly normal Reflexes: 1= Coordination - grossly  normal Gait - grossly normal Balance - grossly normal Cranial Nerves: I: smell Not tested  II: visual acuity  OS: nl    OD: nl  II: visual fields Full to confrontation  II: pupils Equal, round, reactive to light  III,VII: ptosis None  III,IV,VI: extraocular muscles  Full ROM  V: mastication Normal  V: facial light touch sensation  Normal  V,VII: corneal reflex  Present  VII: facial muscle function - upper  Normal  VII: facial muscle function - lower Normal  VIII: hearing Not tested  IX: soft palate elevation  Normal  IX,X: gag reflex Present  XI: trapezius strength  5/5  XI: sternocleidomastoid strength 5/5  XI: neck flexion strength  5/5  XII: tongue strength  Normal    Data Review Lab Results  Component Value Date   WBC 4.4 07/01/2021   HGB 11.3 (L) 07/01/2021   HCT 34.7 (L) 07/01/2021   MCV 92.3 07/01/2021   PLT 152 07/01/2021   Lab Results  Component Value Date   NA 136 07/01/2021   K 4.1 07/01/2021   CL 104 07/01/2021   CO2 24 07/01/2021   BUN 27 (H) 07/01/2021   CREATININE 0.84 07/01/2021   GLUCOSE 122 (H) 07/01/2021   Lab Results  Component Value Date   INR 1.0 07/12/2021    Assessment/Plan:  Estimated body mass index is 30.79 kg/m as calculated from the following:   Height as of this encounter: 5\' 5"  (1.651 m).   Weight as of this encounter: 83.9 kg. Patient admitted for decompressive laminectomy L4-5, L5-s1 L and on-lay fusion. Patient has failed a reasonable attempt at conservative therapy.  I explained the condition and procedure to the patient and answered any questions.  Patient wishes to proceed with procedure as planned. Understands risks/ benefits and typical outcomes of procedure.   Eustace Moore 07/18/2021 7:30 AM

## 2021-07-18 NOTE — Plan of Care (Signed)
Patient alert and oriented, mae's well, voiding adequate amount of urine, swallowing without difficulty, no c/o pain at time of discharge. Patient discharged home with family. Script and discharged instructions given to patient. Patient and family stated understanding of instructions given. Patient has an appointment with Dr. Jones in 2 weeks ?

## 2021-07-18 NOTE — Discharge Summary (Signed)
Physician Discharge Summary  Patient ID: [name] MRN: [id number] DOB/AGE: [birth year] 84 y.o.  Admit date: 07/18/2021 Discharge date: 07/18/2021  Admission Diagnoses: lumbar radiculopathy   Discharge Diagnoses: same   Discharged Condition: good  Hospital Course: The patient was admitted on 07/18/2021 and taken to the operating room where the patient underwent decompression and instrumented fusion L4-5 L5-S1. The patient tolerated the procedure well and was taken to the recovery room and then to the floor in stable condition. The hospital course was routine. There were no complications. The wound remained clean dry and intact. Pt had appropriate back soreness. No complaints of leg pain or new N/T/W. The patient remained afebrile with stable vital signs, and tolerated a regular diet. The patient continued to increase activities, and pain was well controlled with oral pain medications.   Consults: None  Significant Diagnostic Studies:  Results for orders placed or performed during the hospital encounter of 07/18/21  Glucose, capillary  Result Value Ref Range   Glucose-Capillary 142 (H) 70 - 99 mg/dL  Glucose, capillary  Result Value Ref Range   Glucose-Capillary 151 (H) 70 - 99 mg/dL  Glucose, capillary  Result Value Ref Range   Glucose-Capillary 252 (H) 70 - 99 mg/dL   Comment 1 Notify RN    Comment 2 Document in Chart   ABO/Rh  Result Value Ref Range   ABO/RH(D)      A POS Performed at [facility] [address] [place], [place] [address]     DG Lumbar Spine 2-3 Views  Result Date: 07/18/2021 CLINICAL DATA:  Laminectomy EXAM: LUMBAR SPINE - 2-3 VIEW COMPARISON:  MR 03/02/2021 FINDINGS: Fluoroscopic intra op images document interspinous plate overlying L4 and L5 spinous processes. IMPRESSION: Interspinous plate placement X2-1. Electronically Signed   By: Lucrezia Europe M.D.   On: 07/18/2021 10:07   DG C-Arm 1-60 Min-No Report  Result Date:  07/18/2021 Fluoroscopy was utilized by the requesting physician.  No radiographic interpretation.   DG C-Arm 1-60 Min-No Report  Result Date: 07/18/2021 Fluoroscopy was utilized by the requesting physician.  No radiographic interpretation.    Antibiotics:  Anti-infectives (From admission, onward)    Start     Dose/Rate Route Frequency Ordered Stop   07/18/21 1600  ceFAZolin (ANCEF) IVPB 2g/100 mL premix        2 g 200 mL/hr over 30 Minutes Intravenous Every 8 hours 07/18/21 1259 07/19/21 0759   07/18/21 0600  ceFAZolin (ANCEF) IVPB 2g/100 mL premix        2 g 200 mL/hr over 30 Minutes Intravenous On call to O.R. 07/18/21 0548 07/18/21 0819       Discharge Exam: Blood pressure (!) 114/53, pulse 63, temperature 98.1 F (36.7 C), resp. rate 16, height 5\' 5"  (1.651 m), weight 83.9 kg, SpO2 93 %. Neurologic: Grossly normal Dressing dry  Discharge Medications:   Allergies as of 07/18/2021       Reactions   Codeine Nausea And Vomiting, Other (See Comments)   Severe constipation.        Medication List     TAKE these medications    ALPRAZolam 0.5 MG tablet Commonly known as: XANAX Take 0.5 mg by mouth 2 (two) times daily as needed for anxiety.   amLODipine 2.5 MG tablet Commonly known as: NORVASC Take 2.5 mg by mouth daily.   carboxymethylcellulose 0.5 % Soln Commonly known as: REFRESH PLUS Place 1 drop into both eyes 3 (three) times daily as needed (dry eyes).  CENTRUM SILVER PO Take 1 tablet by mouth daily.   chlorpheniramine 4 MG tablet Commonly known as: CHLOR-TRIMETON Take 4 mg by mouth 2 (two) times daily as needed for rhinitis.   diclofenac Sodium 1 % Gel Commonly known as: VOLTAREN Apply 1 application topically at bedtime.   Fluad Quadrivalent 0.5 ML injection Generic drug: influenza vaccine adjuvanted Inject into the muscle.   gabapentin 300 MG capsule Commonly known as: NEURONTIN Take 300 mg by mouth at bedtime.   hydrochlorothiazide 12.5  MG capsule Commonly known as: MICROZIDE Take 1 capsule (12.5 mg total) by mouth daily. What changed:  when to take this reasons to take this   HYDROcodone-acetaminophen 5-325 MG tablet Commonly known as: Norco Take 1-2 tablets by mouth every 6 (six) hours as needed for moderate pain.   isosorbide mononitrate 30 MG 24 hr tablet Commonly known as: IMDUR Take 30 mg by mouth daily.   lidocaine 5 % Commonly known as: LIDODERM Place 1 patch onto the skin daily as needed (pain).   lisinopril 10 MG tablet Commonly known as: ZESTRIL TAKE 1 TABLET BY MOUTH  DAILY   Magnesium 250 MG Tabs Take 250 mg by mouth at bedtime.   metFORMIN 500 MG tablet Commonly known as: GLUCOPHAGE Take 500 mg by mouth at bedtime.   metoprolol tartrate 25 MG tablet Commonly known as: LOPRESSOR Take 25 mg by mouth at bedtime.   nitroGLYCERIN 0.4 MG SL tablet Commonly known as: NITROSTAT Place 1 tablet (0.4 mg total) under the tongue every 5 (five) minutes as needed for chest pain.   ondansetron 8 MG tablet Commonly known as: ZOFRAN Take 1 tablet (8 mg total) by mouth every 8 (eight) hours as needed for nausea or vomiting.   Pfizer COVID-19 Vac Bivalent injection Generic drug: COVID-19 mRNA bivalent vaccine Therapist, music) Inject into the muscle.   Pfizer-BioNTech COVID-19 Vacc 30 MCG/0.3ML injection Generic drug: COVID-19 mRNA vaccine (Pfizer) Inject into the muscle.   polyethylene glycol 17 g packet Commonly known as: MIRALAX / GLYCOLAX Take 17 g by mouth every morning.   rOPINIRole 1 MG tablet Commonly known as: REQUIP Take 1 mg by mouth 3 (three) times daily.   rosuvastatin 40 MG tablet Commonly known as: CRESTOR Take 40 mg by mouth at bedtime.   traZODone 100 MG tablet Commonly known as: DESYREL Take 100 mg by mouth at bedtime.   Vitamin D (Ergocalciferol) 1.25 MG (50000 UNIT) Caps capsule Commonly known as: DRISDOL Take 50,000 Units by mouth every Sunday.        Disposition:  home   Final Dx: decompression and instrumented fusion L4-5 L5-S1  Discharge Instructions      Remove dressing in 72 hours   Complete by: As directed    Call MD for:  redness, tenderness, or signs of infection (pain, swelling, redness, odor or green/yellow discharge around incision site)   Complete by: As directed    Call MD for:  severe uncontrolled pain   Complete by: As directed    Call MD for:  temperature >100.4   Complete by: As directed    Diet - low sodium heart healthy   Complete by: As directed    Increase activity slowly   Complete by: As directed         Follow-up Information     Eustace Moore, MD. Call.   Specialty: Neurosurgery Why: As needed, If symptoms worsen, For wound re-check Contact information: [address] [place] [address] [phone]  Signed: Eustace Moore 07/18/2021, 8:46 PM

## 2021-07-18 NOTE — Anesthesia Postprocedure Evaluation (Signed)
Anesthesia Post Note  Patient: [name]  Procedure(s) Performed: Laminectomy and Foraminotomy - Lumbar four-Lumbar five - Lumbar five-Sacral one- right, posterolateral fusion Lumbar four-five with interspinous plate (Right: Back)     Patient location during evaluation: PACU Anesthesia Type: General Level of consciousness: awake and alert Pain management: pain level controlled Vital Signs Assessment: post-procedure vital signs reviewed and stable Respiratory status: spontaneous breathing, nonlabored ventilation, respiratory function stable and patient connected to nasal cannula oxygen Cardiovascular status: stable and blood pressure returned to baseline Anesthetic complications: no   No notable events documented.  Last Vitals:  Vitals:   07/18/21 1218 07/18/21 1234  BP: (!) 93/55 (!) 98/54  Pulse: (!) 53 (!) 57  Resp: 12 16  Temp:  36.6 C  SpO2: 93% 97%    Last Pain:  Vitals:   07/18/21 1234  PainSc: [name]

## 2021-07-18 NOTE — Transfer of Care (Signed)
Immediate Anesthesia Transfer of Care Note  Patient: [name]  Procedure(s) Performed: Laminectomy and Foraminotomy - Lumbar four-Lumbar five - Lumbar five-Sacral one- right, posterolateral fusion Lumbar four-five with interspinous plate (Right: Back)  Patient Location: PACU  Anesthesia Type:General  Level of Consciousness: alert   Airway & Oxygen Therapy: Patient Spontanous Breathing and Patient connected to face mask oxygen  Post-op Assessment: Report given to RN and Post -op Vital signs reviewed and stable  Post vital signs: Reviewed and stable  Last Vitals:  Vitals Value Taken Time  BP 107/50 07/18/21 1019  Temp    Pulse 64 07/18/21 1019  Resp 17 07/18/21 1019  SpO2 100 % 07/18/21 1019  Vitals shown include unvalidated device data.  Last Pain:  Vitals:   07/18/21 0613  PainSc: 5       Patients Stated Pain Goal: 3 (29/79/89 2119)  Complications: No notable events documented.

## 2021-07-18 NOTE — Op Note (Signed)
07/18/2021  10:12 AM  PATIENT:  [name]  86 y.o. female  PRE-OPERATIVE DIAGNOSIS: Dynamic instability with grade 1 spondylolisthesis L4-5, lateral recess stenosis L4-5, foraminal stenosis L5-S1 right with compression of the right L5 nerve root, right L5 radiculopathy  POST-OPERATIVE DIAGNOSIS:  same  PROCEDURE:  1.  Decompressive lumbar hemilaminectomy medial facetectomy foraminotomies L4-5 on the right, 2.  Decompressive lumbar hemilaminectomy medial facetectomy and foraminotomies L5-S1 on the right for decompression of the L5 and the S1 nerve root, 3.  Nonsegmental fixation L4-5 utilizing an Alphatec interspinous plate, 4.  Posterolateral arthrodesis L4-S1 inclusive left utilizing locally harvested morselized autologous bone graft mixed with DBM putty  SURGEON:  Sherley Bounds, MD  ASSISTANTS: Dr. Ellene Route  ANESTHESIA:   General  EBL: Less than 100 ml  Total I/O In: 1000 [I.V.:1000] Out: 100 [Blood:100]  BLOOD ADMINISTERED: none  DRAINS: None  SPECIMEN:  none  INDICATION FOR PROCEDURE: This patient presented with right L5 radicular pain. Imaging showed foraminal stenosis L5-S1 on the right and a dynamic spondylolisthesis L4-5. The patient tried conservative measures without relief. Pain was debilitating. Recommended right L4-5 L5-S1 decompressive hemilaminectomy medial facetectomy foraminotomies followed by instrumented fusion L4-5. Patient understood the risks, benefits, and alternatives and potential outcomes and wished to proceed.  PROCEDURE DETAILS: The patient was taken to the operating room and after induction of adequate generalized endotracheal anesthesia, the patient was rolled into the prone position on the Wilson frame and all pressure points were padded. The lumbar region was cleaned and then prepped with DuraPrep and draped in the usual sterile fashion. 5 cc of local anesthesia was injected and then a dorsal midline incision was made and carried down to the  lumbo sacral fascia. The fascia was opened and the paraspinous musculature was taken down in a subperiosteal fashion to expose L4-5 and L5-S1 bilaterally. Intraoperative fluoroscopy confirmed my level, and then I used a combination of the high-speed drill and the Kerrison punches to perform a hemilaminectomy, medial facetectomy, and foraminotomy at L4-5 and L5-S1 on the right.  The drill shavings were saved in a mucous trap for later arthrodesis.  The underlying yellow ligament was opened and removed in a piecemeal fashion to expose the underlying dura and exiting nerve root. I undercut the lateral recess and dissected down until I was medial to and distal to the pedicle. The L5 and S1 nerve root was well decompressed.  At L5-S1 I extended my laminotomy and foraminotomies both superiorly and laterally.  There was a large osteophyte coming off of the superior articular process of S1 on the right.  This was removed and undercut to decompress the L5 and the S1 nerve root.  I then palpated with a coronary dilator along the nerve root and into the foramen to assure adequate decompression. I felt no more compression of the nerve root.   I then remove the interspinous ligament at L4-5 and placed an Alphatec interspinous plate and squeeze this into position to offer some posterior tension band at L4-5.  It was locked into position connecting the L4 and the L5 spinous processes.  The bar in between the plates offered some distraction of the disc base also.  This was locked into position and then checked with AP and lateral fluoroscopy and it looked to be in excellent position.  I then used a high-speed drill to decorticate the lamina and facet complex at L4-5 and L5-S1 on the left.  I placed a mixture of local autograft saved during the  decompression and DBM putty to perform arthrodesis L4-5 L5-S1 left.  I irrigated with saline solution containing bacitracin. Achieved hemostasis with bipolar cautery, lined the dura with  Gelfoam, and then closed the fascia with 0 Vicryl. I closed the subcutaneous tissues with 2-0 Vicryl and the subcuticular tissues with 3-0 Vicryl. The skin was then closed with benzoin and Steri-Strips. The drapes were removed, a sterile dressing was applied.  Dr. Ellene Route was involved in the placement of the interspinous plate. the patient was awakened from general anesthesia and transferred to the recovery room in stable condition. At the end of the procedure all sponge, needle and instrument counts were correct.    PLAN OF CARE: Admit for overnight observation  PATIENT DISPOSITION:  PACU - hemodynamically stable.   Delay start of Pharmacological VTE agent (>24hrs) due to surgical blood loss or risk of bleeding:  yes

## 2021-07-18 NOTE — Anesthesia Procedure Notes (Signed)
Procedure Name: Intubation Date/Time: 07/18/2021 7:48 AM Performed by: Erick Colace, CRNA Pre-anesthesia Checklist: Patient identified, Emergency Drugs available, Suction available and Patient being monitored Patient Re-evaluated:Patient Re-evaluated prior to induction Oxygen Delivery Method: Circle system utilized Preoxygenation: Pre-oxygenation with 100% oxygen Induction Type: IV induction Ventilation: Mask ventilation without difficulty Laryngoscope Size: Glidescope and 3 Grade View: Grade I Tube type: Oral Tube size: 7.5 mm Number of attempts: 1 Airway Equipment and Method: Stylet and Oral airway Placement Confirmation: ETT inserted through vocal cords under direct vision, positive ETCO2 and breath sounds checked- equal and bilateral Secured at: 21 cm Tube secured with: Tape Dental Injury: Teeth and Oropharynx as per pre-operative assessment  Comments: Elective glidescope due to history of ACDF

## 2021-07-21 ENCOUNTER — Encounter (HOSPITAL_COMMUNITY): Payer: Self-pay | Admitting: Neurological Surgery

## 2021-07-22 ENCOUNTER — Inpatient Hospital Stay: Payer: Medicare Other

## 2021-07-22 ENCOUNTER — Encounter: Payer: Self-pay | Admitting: Oncology

## 2021-07-22 ENCOUNTER — Inpatient Hospital Stay: Payer: Medicare Other | Admitting: Nurse Practitioner

## 2021-07-28 ENCOUNTER — Encounter: Payer: Self-pay | Admitting: Physician Assistant

## 2021-07-28 ENCOUNTER — Inpatient Hospital Stay: Payer: Medicare Other

## 2021-07-28 ENCOUNTER — Other Ambulatory Visit: Payer: Self-pay

## 2021-07-28 ENCOUNTER — Inpatient Hospital Stay (HOSPITAL_BASED_OUTPATIENT_CLINIC_OR_DEPARTMENT_OTHER): Payer: Medicare Other | Admitting: Physician Assistant

## 2021-07-28 ENCOUNTER — Ambulatory Visit: Payer: Medicare Other

## 2021-07-28 VITALS — BP 154/72 | HR 92 | Temp 97.8°F | Resp 18 | Ht 65.0 in | Wt 186.6 lb

## 2021-07-28 DIAGNOSIS — C9000 Multiple myeloma not having achieved remission: Secondary | ICD-10-CM

## 2021-07-28 DIAGNOSIS — C3492 Malignant neoplasm of unspecified part of left bronchus or lung: Secondary | ICD-10-CM

## 2021-07-28 DIAGNOSIS — Z5112 Encounter for antineoplastic immunotherapy: Secondary | ICD-10-CM | POA: Diagnosis not present

## 2021-07-28 LAB — CBC WITH DIFFERENTIAL (CANCER CENTER ONLY)
Abs Immature Granulocytes: 0.02 10*3/uL (ref 0.00–0.07)
Basophils Absolute: 0 10*3/uL (ref 0.0–0.1)
Basophils Relative: 0 %
Eosinophils Absolute: 0.1 10*3/uL (ref 0.0–0.5)
Eosinophils Relative: 1 %
HCT: 33.4 % — ABNORMAL LOW (ref 36.0–46.0)
Hemoglobin: 10.7 g/dL — ABNORMAL LOW (ref 12.0–15.0)
Immature Granulocytes: 0 %
Lymphocytes Relative: 11 %
Lymphs Abs: 0.7 10*3/uL (ref 0.7–4.0)
MCH: 29.4 pg (ref 26.0–34.0)
MCHC: 32 g/dL (ref 30.0–36.0)
MCV: 91.8 fL (ref 80.0–100.0)
Monocytes Absolute: 0.5 10*3/uL (ref 0.1–1.0)
Monocytes Relative: 8 %
Neutro Abs: 5 10*3/uL (ref 1.7–7.7)
Neutrophils Relative %: 80 %
Platelet Count: 209 10*3/uL (ref 150–400)
RBC: 3.64 MIL/uL — ABNORMAL LOW (ref 3.87–5.11)
RDW: 13.4 % (ref 11.5–15.5)
WBC Count: 6.3 10*3/uL (ref 4.0–10.5)
nRBC: 0 % (ref 0.0–0.2)

## 2021-07-28 LAB — CMP (CANCER CENTER ONLY)
ALT: 12 U/L (ref 0–44)
AST: 16 U/L (ref 15–41)
Albumin: 4 g/dL (ref 3.5–5.0)
Alkaline Phosphatase: 67 U/L (ref 38–126)
Anion gap: 8 (ref 5–15)
BUN: 17 mg/dL (ref 8–23)
CO2: 27 mmol/L (ref 22–32)
Calcium: 9.8 mg/dL (ref 8.9–10.3)
Chloride: 100 mmol/L (ref 98–111)
Creatinine: 0.85 mg/dL (ref 0.44–1.00)
GFR, Estimated: >60 mL/min (ref >60)
Glucose, Bld: 111 mg/dL — ABNORMAL HIGH (ref 70–99)
Potassium: 4.3 mmol/L (ref 3.5–5.1)
Sodium: 135 mmol/L (ref 135–145)
Total Bilirubin: 0.4 mg/dL (ref 0.3–1.2)
Total Protein: 7.2 g/dL (ref 6.5–8.1)

## 2021-07-28 NOTE — Progress Notes (Signed)
[facility] OFFICE PROGRESS NOTE   Diagnosis: Non-small cell lung cancer  INTERVAL HISTORY:   Ms. Bosket returns for follow-up prior to her scheduled pembrolizumab infusion.  He was last seen on 07/01/2021.  Since then, patient underwent decompressive lumbar laminectomy on 07/18/2021.  She is recovering well from surgery but does have back pain that she rates 7 out of 10 on a pain scale.  She reports that pain that radiated down the right lower extremity has nearly resolved.  She does take muscle relaxants and pain medication for her back pain as needed.  Otherwise her energy levels are improving.  He denies any changes to her appetite or weight.  Patient denies nausea, vomiting or abdominal pain.  Her bowel habits are regular without any diarrhea or constipation.  She denies easy bruising or signs of bleeding.  Patient denies any fevers, chills, night sweats, shortness of breath, chest pain, cough, rash.  No other complaints.  Remaining review of systems is negative.   Objective:  Vital signs in last 24 hours:  There were no vitals taken for this visit.    Resp: Lungs clear bilaterally Cardio: Regular rate and rhythm GI: No hepatosplenomegaly Vascular: Lower pretibial edema bilaterally, no erythema or palpable cord     Portacath/PICC-without erythema  Lab Results:  Lab Results  Component Value Date   WBC 6.3 07/28/2021   HGB 10.7 (L) 07/28/2021   HCT 33.4 (L) 07/28/2021   MCV 91.8 07/28/2021   PLT 209 07/28/2021   NEUTROABS 5.0 07/28/2021    CMP  Lab Results  Component Value Date   NA 136 07/01/2021   K 4.1 07/01/2021   CL 104 07/01/2021   CO2 24 07/01/2021   GLUCOSE 122 (H) 07/01/2021   BUN 27 (H) 07/01/2021   CREATININE 0.84 07/01/2021   CALCIUM 10.1 07/01/2021   PROT 6.7 07/01/2021   ALBUMIN 4.5 07/01/2021   AST 22 07/01/2021   ALT 18 07/01/2021   ALKPHOS 64 07/01/2021   BILITOT 0.4 07/01/2021   GFRNONAA >60 07/01/2021   GFRAA >60  04/21/2020     Medications: I have reviewed the patient's current medications.   Assessment/Plan: Non-small cell lung cancer MRI lumbar spine 04/29/2019- enlarging marrow lesions involving the L1 vertebral body, upper left sacrum and right iliac bone MRI pelvis 04/29/2019- 3.5 cm left iliac bone lesion appears slightly larger; other similar appearing lesions present within the left superior pubic ramus, left superior abdomen acetabulum and upper left sacrum Kappa free light chains with mild elevation 05/12/2019  CTs 05/12/2019- left lower lobe pulmonary mass 3.3 x 3.2 cm; lytic process left iliac bone; spinal lesions; 1.1 cm low-density left kidney lesion; right thyroid enlargement with heterogeneous appearance with potential for multiple discrete lesions Biopsy left lower lobe lung mass 05/26/2019-poorly differentiated carcinoma; positive for cytokeratin 5/6, p63 and TTF-1, no EGFR, BRAF, ALK, ERBB2,ROS, or NTRK alteration Cycle 1 carboplatin/Alimta/pembrolizumab 06/06/2019 Cycle 2 carboplatin/Alimta/pembrolizumab 06/27/2019 Cycle 3 carboplatin/Alimta/pembrolizumab 07/18/2019 Cycle 4 carboplatin/Alimta/pembrolizumab 08/07/2019 CTs 08/27/2019-significant decrease in size of lobulated mass left lower lobe.  Unchanged appearance of subtle bone lesions. Cycle 5 Alimta/pembrolizumab 08/28/2019 Cycle 6 Alimta/pembrolizumab 09/18/2019 Cycle 7 Alimta/pembrolizumab 10/09/2019 Cycle 8 Alimta/pembrolizumab 10/30/2019 Cycle 9 Alimta/pembrolizumab 11/20/2019 CTs 12/09/2019-no evidence of disease progression, left lower lobe nodule slightly decreased in size, stable L1, left sacral, and left pubic ramus metastases Cycle 10 Alimta/pembrolizumab 12/11/2019 Cycle 11 pembrolizumab alone 01/02/2020 (Alimta held due to edema, tenderness, erythema at the lower legs) Cycle 12 pembrolizumab 01/23/2020 Cycle 13 pembrolizumab 02/12/2020  Cycle 14 pembrolizumab 03/04/2020 CTs 03/18/2020-stable left lower lobe lesion, mild sclerosis  at the superior endplate of L1 that was previously hypermetabolic, stable small left upper sacral lucent lesion, previous left superior pubic ramus lesion is occult on the CT, CT head negative for malignancy Cycle 15 pembrolizumab 03/25/2020 Cycle 16 pembrolizumab 04/21/2020 Cycle 17 pembrolizumab 05/17/2020 05/19/2020 bone scan-no definite abnormalities to suggest osseous metastases.  Areas of concern on prior PET-CT involving left iliac bone and L1 vertebral body showed no abnormalities on the current study Cycle 18 Pembrolizumab 06/10/2020 Cycle 19 Pembrolizumab 06/30/2020 CTs 07/16/2020-stable left lower lobe nodule, stable faint superior L1 vertebral lesion, no evidence of disease progression Cycle 20 pembrolizumab 07/21/2020 Cycle 21 Pembrolizumab 08/11/2020 Cycle 22 pembrolizumab 09/01/2020 Cycle 23 Pembrolizumab 09/22/2020 Cycle 24 pembrolizumab 10/13/2020 Cycle 25 Pembrolizumab 11/03/2020 Cycle 26 pembrolizumab 11/26/2020 CTs 12/15/2020- stable left lower lobe mass, stable sclerotic lesion at L2, no evidence of disease progression Cycle 27 pembrolizumab 12/17/2020 PET scan 01/03/2021-1.8 x 1.3 cm left lower lobe nodule similar in size to CT of 12/15/2020 and measures smaller than previous PET/CT from 2020.  Nodule is markedly hypermetabolic.  No evidence for hypermetabolic hilar or mediastinal lymphadenopathy.  Several tiny foci of hypermetabolism identified in bony anatomy raising concern for skeletal metastases.  Comparison of the PET to CT 07/16/2020-lobular left lower lobe pulmonary nodule measured 2.4 x 1.5 cm on the prior study, current study it measured 1.8 x 1.3 cm.  Lesion in the anterior left acetabulum is similar to the 07/16/2020 exam although overlying cortical thinning slightly more pronounced on the current study.  Described lesion in the scapula shows some cortical sclerosis and a tiny central marrow lucency not substantially changed compared to 07/16/2020.  Left third rib lesion shows  heterogeneous mineralization similar to 07/16/2020. MRI of cervical spine 01/13/2021-increased left facet edema at C5-C6, severe facet arthrosis on the left at C7-T1 and on the right at C3-C4, no evidence of metastatic disease Cycle 28 Pembrolizumab 01/14/2021 Cycle 29 Pembrolizumab 02/03/2021 Cycle 30 Pembrolizumab 02/24/2021 MRI left hip 03/02/2021-compared to 04/29/2019, slight increase in size of metastases at the left iliac and left acetabulum.  Lesion at the left upper sacrum is less conspicuous and a lesion at the left superior pubic ramus is stable Cycle 31 pembrolizumab 03/17/2021 Radiation to left acetabulum and left iliac 03/30/2021-04/13/2021 Cycle 32 pembrolizumab 04/07/2021 Cycle 33 Pembrolizumab 04/28/2021 Cycle 34 pembrolizumab 05/18/2021 PET scan 06/01/2021-no significant change in size or degree of FDG uptake associated with FDG avid left lower lobe lung nodule compatible with neoplasm.  Stable to improved appearance of multifocal FDG avid bone metastasis. Cycle 35 pembrolizumab 06/10/2021 Cycle 36 pembrolizumab 07/01/2021 Cycle 37 pembrolizumab 07/29/2021  Pain secondary to #1, improved Chronic back pain, underwent decompressive lumbar laminectomy on 07/18/2021.  Type 2 diabetes Essential hypertension CAD Hyperlipidemia Family history significant for multiple members with breast cancer Grade 1 skin rash 07/18/2019 likely related to immunotherapy.  Topical steroid cream as needed. E. coli urinary tract infection 07/14/2019.  Completed cephalexin. Edema/tenderness at the right greater than left ankle 10/21/2019-etiology unclear, potentially related to systemic therapy or an infection, doxycycline prescribed-improved 10/23/2019; marked improvement 11/20/2019; at office visit 01/02/2020 she reports worsening of lower extremity edema, pain/tenderness, erythema 3 to 4 days following each treatment.  Alimta held 01/02/2020.  Referral to dermatology. COVID-19 infection 03/30/2020, monoclonal antibody  therapy 04/06/2020       Disposition: Ms. Vullo is doing well since undergoing decompressive lumbar laminectomy on 07/18/2021.  She is managing her postsurgical back pain  with muscle relaxants and pain medications.  Otherwise, she is doing very well with no significant limitations.  Regarding her lower extremity edema, I advised her to continue to elevate her legs and take hydrochlorothiazide once daily.  Labs from today were reviewed and adequate for treatment.  She will proceed with cycle 37 of pembrolizumab scheduled for tomorrow.  She will return in 3 weeks prior to her next treatment.   Follow up: -Treatment with cycle 37 pembrolizumab tomorrow -RTC on 08/12/2021 for labs, office visit with Dr. Benay Spice and treatment with pembrolizumab.   Patient was agreeable to the plan and had no other questions  I have spent a total of 25 minutes minutes of face-to-face and non-face-to-face time, preparing to see the patient, performing a medically appropriate examination, counseling and educating the patient, documenting clinical information in the electronic health record,  and care coordination.   Lincoln Brigham, PA-C Hematology and Oncology [facility]

## 2021-07-29 ENCOUNTER — Inpatient Hospital Stay: Payer: Medicare Other

## 2021-07-29 VITALS — BP 114/60 | HR 57 | Temp 98.2°F | Resp 18

## 2021-07-29 DIAGNOSIS — Z5112 Encounter for antineoplastic immunotherapy: Secondary | ICD-10-CM | POA: Diagnosis not present

## 2021-07-29 DIAGNOSIS — C3492 Malignant neoplasm of unspecified part of left bronchus or lung: Secondary | ICD-10-CM

## 2021-07-29 MED ORDER — SODIUM CHLORIDE 0.9% FLUSH
10.0000 mL | INTRAVENOUS | Status: DC | PRN
  Administered 2021-07-29 (×2): 10 mL

## 2021-07-29 MED ORDER — HEPARIN SOD (PORK) LOCK FLUSH 100 UNIT/ML IV SOLN
500.0000 [IU] | Freq: Once | INTRAVENOUS | Status: AC
  Administered 2021-07-29: 12:00:00 500 [IU] via INTRAVENOUS

## 2021-07-29 MED ORDER — SODIUM CHLORIDE 0.9 % IV SOLN: Freq: Once | INTRAVENOUS | Status: AC

## 2021-07-29 MED ORDER — SODIUM CHLORIDE 0.9 % IV SOLN
200.0000 mg | Freq: Once | INTRAVENOUS | Status: AC
  Administered 2021-07-29: 11:00:00 200 mg via INTRAVENOUS
  Filled 2021-07-29: qty 8

## 2021-07-29 NOTE — Patient Instructions (Signed)
[facility]  Discharge Instructions: Thank you for choosing [facility] to provide your oncology and hematology care.   If you have a lab appointment with the [facility], please go directly to the [facility] and check in at the registration area.   Wear comfortable clothing and clothing appropriate for easy access to any Portacath or PICC line.   We strive to give you quality time with your provider. You may need to reschedule your appointment if you arrive late (15 or more minutes).  Arriving late affects you and other patients whose appointments are after yours.  Also, if you miss three or more appointments without notifying the office, you may be dismissed from the clinic at the providers discretion.      For prescription refill requests, have your pharmacy contact our office and allow 72 hours for refills to be completed.    Today you received the following chemotherapy and/or immunotherapy agents keytruda  Pembrolizumab injection What is this medication? PEMBROLIZUMAB (pem broe liz ue mab) is a monoclonal antibody. It is used to treat certain types of cancer. This medicine may be used for other purposes; ask your health care provider or pharmacist if you have questions. COMMON BRAND NAME(S): Keytruda What should I tell my care team before I take this medication? They need to know if you have any of these conditions: autoimmune diseases like Crohn's disease, ulcerative colitis, or lupus have had or planning to have an allogeneic stem cell transplant (uses someone else's stem cells) history of organ transplant history of chest radiation nervous system problems like myasthenia gravis or Guillain-Barre syndrome an unusual or allergic reaction to pembrolizumab, other medicines, foods, dyes, or preservatives pregnant or trying to get pregnant breast-feeding How should I use this medication? This medicine is for infusion into a vein. It is  given by a health care professional in a hospital or clinic setting. A special MedGuide will be given to you before each treatment. Be sure to read this information carefully each time. Talk to your pediatrician regarding the use of this medicine in children. While this drug may be prescribed for children as young as 6 months for selected conditions, precautions do apply. Overdosage: If you think you have taken too much of this medicine contact a poison control center or emergency room at once. NOTE: This medicine is only for you. Do not share this medicine with others. What if I miss a dose? It is important not to miss your dose. Call your doctor or health care professional if you are unable to keep an appointment. What may interact with this medication? Interactions have not been studied. This list may not describe all possible interactions. Give your health care provider a list of all the medicines, herbs, non-prescription drugs, or dietary supplements you use. Also tell them if you smoke, drink alcohol, or use illegal drugs. Some items may interact with your medicine. What should I watch for while using this medication? Your condition will be monitored carefully while you are receiving this medicine. You may need blood work done while you are taking this medicine. Do not become pregnant while taking this medicine or for 4 months after stopping it. Women should inform their doctor if they wish to become pregnant or think they might be pregnant. There is a potential for serious side effects to an unborn child. Talk to your health care professional or pharmacist for more information. Do not breast-feed an infant while taking this  medicine or for 4 months after the last dose. What side effects may I notice from receiving this medication? Side effects that you should report to your doctor or health care professional as soon as possible: allergic reactions like skin rash, itching or hives, swelling of  the face, lips, or tongue bloody or black, tarry breathing problems changes in vision chest pain chills confusion constipation cough diarrhea dizziness or feeling faint or lightheaded fast or irregular heartbeat fever flushing joint pain low blood counts - this medicine may decrease the number of white blood cells, red blood cells and platelets. You may be at increased risk for infections and bleeding. muscle pain muscle weakness pain, tingling, numbness in the hands or feet persistent headache redness, blistering, peeling or loosening of the skin, including inside the mouth signs and symptoms of high blood sugar such as dizziness; dry mouth; dry skin; fruity breath; nausea; stomach pain; increased hunger or thirst; increased urination signs and symptoms of kidney injury like trouble passing urine or change in the amount of urine signs and symptoms of liver injury like dark urine, light-colored stools, loss of appetite, nausea, right upper belly pain, yellowing of the eyes or skin sweating swollen lymph nodes weight loss Side effects that usually do not require medical attention (report to your doctor or health care professional if they continue or are bothersome): decreased appetite hair loss tiredness This list may not describe all possible side effects. Call your doctor for medical advice about side effects. You may report side effects to FDA at 1-800-FDA-1088. Where should I keep my medication? This drug is given in a hospital or clinic and will not be stored at home. NOTE: This sheet is a summary. It may not cover all possible information. If you have questions about this medicine, talk to your doctor, pharmacist, or health care provider.  2022 Elsevier/Gold Standard (2021-04-05 00:00:00)       To help prevent nausea and vomiting after your treatment, we encourage you to take your nausea medication as directed.  BELOW ARE SYMPTOMS THAT SHOULD BE REPORTED  IMMEDIATELY: *FEVER GREATER THAN 100.4 F (38 C) OR HIGHER *CHILLS OR SWEATING *NAUSEA AND VOMITING THAT IS NOT CONTROLLED WITH YOUR NAUSEA MEDICATION *UNUSUAL SHORTNESS OF BREATH *UNUSUAL BRUISING OR BLEEDING *URINARY PROBLEMS (pain or burning when urinating, or frequent urination) *BOWEL PROBLEMS (unusual diarrhea, constipation, pain near the anus) TENDERNESS IN MOUTH AND THROAT WITH OR WITHOUT PRESENCE OF ULCERS (sore throat, sores in mouth, or a toothache) UNUSUAL RASH, SWELLING OR PAIN  UNUSUAL VAGINAL DISCHARGE OR ITCHING   Items with * indicate a potential emergency and should be followed up as soon as possible or go to the Emergency Department if any problems should occur.  Please show the CHEMOTHERAPY ALERT CARD or IMMUNOTHERAPY ALERT CARD at check-in to the Emergency Department and triage nurse.  Should you have questions after your visit or need to cancel or reschedule your appointment, please contact [facility]  Dept: [phone]  and follow the prompts.  Office hours are 8:00 a.m. to 4:30 p.m. Monday - Friday. Please note that voicemails left after 4:00 p.m. may not be returned until the following business day.  We are closed weekends and major holidays. You have access to a nurse at all times for urgent questions. Please call the main number to the clinic Dept: [phone] and follow the prompts.   For any non-urgent questions, you may also contact your provider using MyChart. We now offer e-Visits for  anyone 56 and older to request care online for non-urgent symptoms. For details visit [email or web address].   Also download the MyChart app! Go to the app store, search "MyChart", open the app, select [facility], and log in with your MyChart username and password.  Due to Covid, a mask is required upon entering the hospital/clinic. If you do not have a mask, one will be given to you upon arrival. For doctor visits, patients may have 1 support  person aged 37 or older with them. For treatment visits, patients cannot have anyone with them due to current Covid guidelines and our immunocompromised population.

## 2021-08-10 ENCOUNTER — Other Ambulatory Visit: Payer: Self-pay | Admitting: Nurse Practitioner

## 2021-08-10 ENCOUNTER — Encounter: Payer: Self-pay | Admitting: Oncology

## 2021-08-10 DIAGNOSIS — C3492 Malignant neoplasm of unspecified part of left bronchus or lung: Secondary | ICD-10-CM

## 2021-08-11 ENCOUNTER — Telehealth: Payer: Self-pay

## 2021-08-11 ENCOUNTER — Other Ambulatory Visit: Payer: Self-pay | Admitting: Nurse Practitioner

## 2021-08-11 ENCOUNTER — Encounter: Payer: Self-pay | Admitting: Nurse Practitioner

## 2021-08-11 DIAGNOSIS — C3492 Malignant neoplasm of unspecified part of left bronchus or lung: Secondary | ICD-10-CM

## 2021-08-11 MED ORDER — OXYCODONE-ACETAMINOPHEN 5-325 MG PO TABS: 1.0000 | ORAL_TABLET | Freq: Three times a day (TID) | ORAL | 0 refills | Status: DC | PRN

## 2021-08-11 NOTE — Telephone Encounter (Signed)
Called and spoke with the patient about her pain medication. Patient stating she is in pain shoulder and lower back 8 out of 10 and she need she refill. This message was giving to [employer]. Marcello Moores place on her desk.

## 2021-08-12 ENCOUNTER — Telehealth: Payer: Self-pay

## 2021-08-12 ENCOUNTER — Ambulatory Visit: Payer: Medicare Other

## 2021-08-12 ENCOUNTER — Ambulatory Visit: Payer: Medicare Other | Admitting: Oncology

## 2021-08-12 ENCOUNTER — Other Ambulatory Visit: Payer: Medicare Other

## 2021-08-12 ENCOUNTER — Other Ambulatory Visit: Payer: Self-pay | Admitting: Nurse Practitioner

## 2021-08-12 ENCOUNTER — Inpatient Hospital Stay: Payer: Medicare Other

## 2021-08-12 DIAGNOSIS — C3492 Malignant neoplasm of unspecified part of left bronchus or lung: Secondary | ICD-10-CM

## 2021-08-12 MED ORDER — HYDROCODONE-ACETAMINOPHEN 5-325 MG PO TABS: 1.0000 | ORAL_TABLET | Freq: Four times a day (QID) | ORAL | 0 refills | Status: DC | PRN

## 2021-08-12 NOTE — Telephone Encounter (Signed)
Called and spoke with patient regarding her pain medication. Hydrocodone-acetaminophen was send in at [facility] drug. Patient voiced understanding

## 2021-08-14 ENCOUNTER — Other Ambulatory Visit: Payer: Self-pay | Admitting: Oncology

## 2021-08-15 ENCOUNTER — Other Ambulatory Visit: Payer: Self-pay | Admitting: Nurse Practitioner

## 2021-08-15 ENCOUNTER — Encounter: Payer: Self-pay | Admitting: Oncology

## 2021-08-15 DIAGNOSIS — C3492 Malignant neoplasm of unspecified part of left bronchus or lung: Secondary | ICD-10-CM

## 2021-08-15 MED ORDER — HYDROCODONE-ACETAMINOPHEN 5-325 MG PO TABS: 1.0000 | ORAL_TABLET | Freq: Four times a day (QID) | ORAL | 0 refills | Status: DC | PRN

## 2021-08-19 ENCOUNTER — Inpatient Hospital Stay: Payer: Medicare Other

## 2021-08-19 ENCOUNTER — Encounter: Payer: Self-pay | Admitting: *Deleted

## 2021-08-19 ENCOUNTER — Inpatient Hospital Stay: Payer: Medicare Other | Attending: Oncology | Admitting: Oncology

## 2021-08-19 ENCOUNTER — Other Ambulatory Visit: Payer: Self-pay

## 2021-08-19 VITALS — BP 151/62 | HR 69 | Temp 97.8°F | Resp 19 | Ht 65.0 in | Wt 184.0 lb

## 2021-08-19 VITALS — BP 151/58 | HR 55 | Temp 97.7°F | Resp 18

## 2021-08-19 DIAGNOSIS — C3492 Malignant neoplasm of unspecified part of left bronchus or lung: Secondary | ICD-10-CM

## 2021-08-19 DIAGNOSIS — C7951 Secondary malignant neoplasm of bone: Secondary | ICD-10-CM | POA: Diagnosis present

## 2021-08-19 DIAGNOSIS — C3432 Malignant neoplasm of lower lobe, left bronchus or lung: Secondary | ICD-10-CM | POA: Insufficient documentation

## 2021-08-19 DIAGNOSIS — C9000 Multiple myeloma not having achieved remission: Secondary | ICD-10-CM

## 2021-08-19 DIAGNOSIS — Z5112 Encounter for antineoplastic immunotherapy: Secondary | ICD-10-CM | POA: Insufficient documentation

## 2021-08-19 LAB — CMP (CANCER CENTER ONLY)
ALT: 15 U/L (ref 0–44)
AST: 21 U/L (ref 15–41)
Albumin: 4.4 g/dL (ref 3.5–5.0)
Alkaline Phosphatase: 90 U/L (ref 38–126)
Anion gap: 8 (ref 5–15)
BUN: 23 mg/dL (ref 8–23)
CO2: 26 mmol/L (ref 22–32)
Calcium: 10.2 mg/dL (ref 8.9–10.3)
Chloride: 102 mmol/L (ref 98–111)
Creatinine: 0.91 mg/dL (ref 0.44–1.00)
GFR, Estimated: >60 mL/min (ref >60)
Glucose, Bld: 107 mg/dL — ABNORMAL HIGH (ref 70–99)
Potassium: 4.3 mmol/L (ref 3.5–5.1)
Sodium: 136 mmol/L (ref 135–145)
Total Bilirubin: 0.4 mg/dL (ref 0.3–1.2)
Total Protein: 7.2 g/dL (ref 6.5–8.1)

## 2021-08-19 LAB — CBC WITH DIFFERENTIAL (CANCER CENTER ONLY)
Abs Immature Granulocytes: 0.01 10*3/uL (ref 0.00–0.07)
Basophils Absolute: 0 10*3/uL (ref 0.0–0.1)
Basophils Relative: 0 %
Eosinophils Absolute: 0.1 10*3/uL (ref 0.0–0.5)
Eosinophils Relative: 2 %
HCT: 35.4 % — ABNORMAL LOW (ref 36.0–46.0)
Hemoglobin: 11.3 g/dL — ABNORMAL LOW (ref 12.0–15.0)
Immature Granulocytes: 0 %
Lymphocytes Relative: 18 %
Lymphs Abs: 0.8 10*3/uL (ref 0.7–4.0)
MCH: 28.6 pg (ref 26.0–34.0)
MCHC: 31.9 g/dL (ref 30.0–36.0)
MCV: 89.6 fL (ref 80.0–100.0)
Monocytes Absolute: 0.4 10*3/uL (ref 0.1–1.0)
Monocytes Relative: 10 %
Neutro Abs: 3 10*3/uL (ref 1.7–7.7)
Neutrophils Relative %: 70 %
Platelet Count: 148 10*3/uL — ABNORMAL LOW (ref 150–400)
RBC: 3.95 MIL/uL (ref 3.87–5.11)
RDW: 13.5 % (ref 11.5–15.5)
WBC Count: 4.4 10*3/uL (ref 4.0–10.5)
nRBC: 0 % (ref 0.0–0.2)

## 2021-08-19 MED ORDER — SODIUM CHLORIDE 0.9 % IV SOLN
200.0000 mg | Freq: Once | INTRAVENOUS | Status: AC
  Administered 2021-08-19: 200 mg via INTRAVENOUS
  Filled 2021-08-19: qty 8

## 2021-08-19 MED ORDER — HEPARIN SOD (PORK) LOCK FLUSH 100 UNIT/ML IV SOLN
500.0000 [IU] | Freq: Once | INTRAVENOUS | Status: AC | PRN
  Administered 2021-08-19: 500 [IU]

## 2021-08-19 MED ORDER — SODIUM CHLORIDE 0.9 % IV SOLN: Freq: Once | INTRAVENOUS | Status: AC

## 2021-08-19 MED ORDER — SODIUM CHLORIDE 0.9% FLUSH
10.0000 mL | INTRAVENOUS | Status: DC | PRN
  Administered 2021-08-19: 10 mL

## 2021-08-19 NOTE — Progress Notes (Signed)
Patient seen by Dr. Sherrill today ? ?Vitals are within treatment parameters. ? ?Labs reviewed by Dr. Sherrill and are within treatment parameters. ? ?Per physician team, patient is ready for treatment and there are NO modifications to the treatment plan.  ?

## 2021-08-19 NOTE — Patient Instructions (Signed)
[name]   Discharge Instructions: Thank you for choosing [facility] to provide your oncology and hematology care.   If you have a lab appointment with the [facility], please go directly to the [facility] and check in at the registration area.   Wear comfortable clothing and clothing appropriate for easy access to any Portacath or PICC line.   We strive to give you quality time with your provider. You may need to reschedule your appointment if you arrive late (15 or more minutes).  Arriving late affects you and other patients whose appointments are after yours.  Also, if you miss three or more appointments without notifying the office, you may be dismissed from the clinic at the providers discretion.      For prescription refill requests, have your pharmacy contact our office and allow 72 hours for refills to be completed.    Today you received the following chemotherapy and/or immunotherapy agents Pembrolizumab (KEYTRUDA).      To help prevent nausea and vomiting after your treatment, we encourage you to take your nausea medication as directed.  BELOW ARE SYMPTOMS THAT SHOULD BE REPORTED IMMEDIATELY: *FEVER GREATER THAN 100.4 F (38 C) OR HIGHER *CHILLS OR SWEATING *NAUSEA AND VOMITING THAT IS NOT CONTROLLED WITH YOUR NAUSEA MEDICATION *UNUSUAL SHORTNESS OF BREATH *UNUSUAL BRUISING OR BLEEDING *URINARY PROBLEMS (pain or burning when urinating, or frequent urination) *BOWEL PROBLEMS (unusual diarrhea, constipation, pain near the anus) TENDERNESS IN MOUTH AND THROAT WITH OR WITHOUT PRESENCE OF ULCERS (sore throat, sores in mouth, or a toothache) UNUSUAL RASH, SWELLING OR PAIN  UNUSUAL VAGINAL DISCHARGE OR ITCHING   Items with * indicate a potential emergency and should be followed up as soon as possible or go to the Emergency Department if any problems should occur.  Please show the CHEMOTHERAPY ALERT CARD or IMMUNOTHERAPY ALERT CARD  at check-in to the Emergency Department and triage nurse.  Should you have questions after your visit or need to cancel or reschedule your appointment, please contact [facility]  Dept: [phone]  and follow the prompts.  Office hours are 8:00 a.m. to 4:30 p.m. Monday - Friday. Please note that voicemails left after 4:00 p.m. may not be returned until the following business day.  We are closed weekends and major holidays. You have access to a nurse at all times for urgent questions. Please call the main number to the clinic Dept: [phone] and follow the prompts.   For any non-urgent questions, you may also contact your provider using MyChart. We now offer e-Visits for anyone 31 and older to request care online for non-urgent symptoms. For details visit [email or web address].   Also download the MyChart app! Go to the app store, search "MyChart", open the app, select [facility], and log in with your MyChart username and password.  Due to Covid, a mask is required upon entering the hospital/clinic. If you do not have a mask, one will be given to you upon arrival. For doctor visits, patients may have 1 support person aged 22 or older with them. For treatment visits, patients cannot have anyone with them due to current Covid guidelines and our immunocompromised population.   Pembrolizumab injection What is this medication? PEMBROLIZUMAB (pem broe liz ue mab) is a monoclonal antibody. It is used to treat certain types of cancer. This medicine may be used for other purposes; ask your health care provider or pharmacist if you have questions. COMMON BRAND NAME(S):  Keytruda What should I tell my care team before I take this medication? They need to know if you have any of these conditions: autoimmune diseases like Crohn's disease, ulcerative colitis, or lupus have had or planning to have an allogeneic stem cell transplant (uses someone else's stem cells) history of  organ transplant history of chest radiation nervous system problems like myasthenia gravis or Guillain-Barre syndrome an unusual or allergic reaction to pembrolizumab, other medicines, foods, dyes, or preservatives pregnant or trying to get pregnant breast-feeding How should I use this medication? This medicine is for infusion into a vein. It is given by a health care professional in a hospital or clinic setting. A special MedGuide will be given to you before each treatment. Be sure to read this information carefully each time. Talk to your pediatrician regarding the use of this medicine in children. While this drug may be prescribed for children as young as 6 months for selected conditions, precautions do apply. Overdosage: If you think you have taken too much of this medicine contact a poison control center or emergency room at once. NOTE: This medicine is only for you. Do not share this medicine with others. What if I miss a dose? It is important not to miss your dose. Call your doctor or health care professional if you are unable to keep an appointment. What may interact with this medication? Interactions have not been studied. This list may not describe all possible interactions. Give your health care provider a list of all the medicines, herbs, non-prescription drugs, or dietary supplements you use. Also tell them if you smoke, drink alcohol, or use illegal drugs. Some items may interact with your medicine. What should I watch for while using this medication? Your condition will be monitored carefully while you are receiving this medicine. You may need blood work done while you are taking this medicine. Do not become pregnant while taking this medicine or for 4 months after stopping it. Women should inform their doctor if they wish to become pregnant or think they might be pregnant. There is a potential for serious side effects to an unborn child. Talk to your health care professional or  pharmacist for more information. Do not breast-feed an infant while taking this medicine or for 4 months after the last dose. What side effects may I notice from receiving this medication? Side effects that you should report to your doctor or health care professional as soon as possible: allergic reactions like skin rash, itching or hives, swelling of the face, lips, or tongue bloody or black, tarry breathing problems changes in vision chest pain chills confusion constipation cough diarrhea dizziness or feeling faint or lightheaded fast or irregular heartbeat fever flushing joint pain low blood counts - this medicine may decrease the number of white blood cells, red blood cells and platelets. You may be at increased risk for infections and bleeding. muscle pain muscle weakness pain, tingling, numbness in the hands or feet persistent headache redness, blistering, peeling or loosening of the skin, including inside the mouth signs and symptoms of high blood sugar such as dizziness; dry mouth; dry skin; fruity breath; nausea; stomach pain; increased hunger or thirst; increased urination signs and symptoms of kidney injury like trouble passing urine or change in the amount of urine signs and symptoms of liver injury like dark urine, light-colored stools, loss of appetite, nausea, right upper belly pain, yellowing of the eyes or skin sweating swollen lymph nodes weight loss Side effects that usually do not  require medical attention (report to your doctor or health care professional if they continue or are bothersome): decreased appetite hair loss tiredness This list may not describe all possible side effects. Call your doctor for medical advice about side effects. You may report side effects to FDA at 1-800-FDA-1088. Where should I keep my medication? This drug is given in a hospital or clinic and will not be stored at home. NOTE: This sheet is a summary. It may not cover all possible  information. If you have questions about this medicine, talk to your doctor, pharmacist, or health care provider.  2022 Elsevier/Gold Standard (2021-04-05 00:00:00)

## 2021-08-19 NOTE — Progress Notes (Signed)
Patient presents for treatment. RN assessment completed along with the following:   Labs/vitals reviewed - Yes, and within treatment parameters.   Weight within 10% of previous measurement - Yes Oncology Treatment Attestation completed for current therapy- Yes, on date 05/29/2019 Informed consent completed and reflects current therapy/intent - Yes, on date 06/06/2019             Provider progress note reviewed - Today's provider note reviewed. Treatment/Antibody/Supportive plan reviewed - Yes, and there are no adjustments needed for today's treatment. S&H and other orders reviewed - Yes, and there are no additional orders identified. Previous treatment date reviewed - Yes, and the appropriate amount of time has elapsed between treatments. Clinic Hand Off Received from - Yes from [place], [place].   Patient to proceed with treatment.

## 2021-08-19 NOTE — Progress Notes (Signed)
[facility] OFFICE PROGRESS NOTE   Diagnosis: Non-small cell lung cancer  INTERVAL HISTORY:   Ms. Fencl completed another treatment with pembrolizumab on 07/29/2021.  No diarrhea.  She thinks she may have a rash at the back.  She has developed discomfort in the right great and left shoulder.  The pain is present with certain movements.  Leg numbness improved following the spine surgery.  She continues to have hip pain.  Objective:  Vital signs in last 24 hours:  Blood pressure (!) 151/62, pulse 69, temperature 97.8 F (36.6 C), temperature source Oral, resp. rate 19, height _0  (1.651 m), weight 184 lb (83.5 kg), SpO2 98 %.    Resp: Lungs clear bilaterally Cardio: Regular rate and rhythm GI: No hepatosplenomegaly Vascular: No leg edema Musculoskeletal: Pain with rotation at the right shoulder.  Portacath/PICC-without erythema  Lab Results:  Lab Results  Component Value Date   WBC 4.4 08/19/2021   HGB 11.3 (L) 08/19/2021   HCT 35.4 (L) 08/19/2021   MCV 89.6 08/19/2021   PLT 148 (L) 08/19/2021   NEUTROABS 3.0 08/19/2021    CMP  Lab Results  Component Value Date   NA 136 08/19/2021   K 4.3 08/19/2021   CL 102 08/19/2021   CO2 26 08/19/2021   GLUCOSE 107 (H) 08/19/2021   BUN 23 08/19/2021   CREATININE 0.91 08/19/2021   CALCIUM 10.2 08/19/2021   PROT 7.2 08/19/2021   ALBUMIN 4.4 08/19/2021   AST 21 08/19/2021   ALT 15 08/19/2021   ALKPHOS 90 08/19/2021   BILITOT 0.4 08/19/2021   GFRNONAA >60 08/19/2021   GFRAA >60 04/21/2020     Medications: I have reviewed the patient's current medications.   Assessment/Plan: Non-small cell lung cancer MRI lumbar spine 04/29/2019- enlarging marrow lesions involving the L1 vertebral body, upper left sacrum and right iliac bone MRI pelvis 04/29/2019- 3.5 cm left iliac bone lesion appears slightly larger; other similar appearing lesions present within the left superior pubic ramus, left superior abdomen  acetabulum and upper left sacrum Kappa free light chains with mild elevation 05/12/2019  CTs 05/12/2019- left lower lobe pulmonary mass 3.3 x 3.2 cm; lytic process left iliac bone; spinal lesions; 1.1 cm low-density left kidney lesion; right thyroid enlargement with heterogeneous appearance with potential for multiple discrete lesions Biopsy left lower lobe lung mass 05/26/2019-poorly differentiated carcinoma; positive for cytokeratin 5/6, p63 and TTF-1, no EGFR, BRAF, ALK, ERBB2,ROS, or NTRK alteration Cycle 1 carboplatin/Alimta/pembrolizumab 06/06/2019 Cycle 2 carboplatin/Alimta/pembrolizumab 06/27/2019 Cycle 3 carboplatin/Alimta/pembrolizumab 07/18/2019 Cycle 4 carboplatin/Alimta/pembrolizumab 08/07/2019 CTs 08/27/2019-significant decrease in size of lobulated mass left lower lobe.  Unchanged appearance of subtle bone lesions. Cycle 5 Alimta/pembrolizumab 08/28/2019 Cycle 6 Alimta/pembrolizumab 09/18/2019 Cycle 7 Alimta/pembrolizumab 10/09/2019 Cycle 8 Alimta/pembrolizumab 10/30/2019 Cycle 9 Alimta/pembrolizumab 11/20/2019 CTs 12/09/2019-no evidence of disease progression, left lower lobe nodule slightly decreased in size, stable L1, left sacral, and left pubic ramus metastases Cycle 10 Alimta/pembrolizumab 12/11/2019 Cycle 11 pembrolizumab alone 01/02/2020 (Alimta held due to edema, tenderness, erythema at the lower legs) Cycle 12 pembrolizumab 01/23/2020 Cycle 13 pembrolizumab 02/12/2020 Cycle 14 pembrolizumab 03/04/2020 CTs 03/18/2020-stable left lower lobe lesion, mild sclerosis at the superior endplate of L1 that was previously hypermetabolic, stable small left upper sacral lucent lesion, previous left superior pubic ramus lesion is occult on the CT, CT head negative for malignancy Cycle 15 pembrolizumab 03/25/2020 Cycle 16 pembrolizumab 04/21/2020 Cycle 17 pembrolizumab 05/17/2020 05/19/2020 bone scan-no definite abnormalities to suggest osseous metastases.  Areas of concern on prior PET-CT involving  left iliac bone and L1 vertebral body showed no abnormalities on the current study Cycle 18 Pembrolizumab 06/10/2020 Cycle 19 Pembrolizumab 06/30/2020 CTs 07/16/2020-stable left lower lobe nodule, stable faint superior L1 vertebral lesion, no evidence of disease progression Cycle 20 pembrolizumab 07/21/2020 Cycle 21 Pembrolizumab 08/11/2020 Cycle 22 pembrolizumab 09/01/2020 Cycle 23 Pembrolizumab 09/22/2020 Cycle 24 pembrolizumab 10/13/2020 Cycle 25 Pembrolizumab 11/03/2020 Cycle 26 pembrolizumab 11/26/2020 CTs 12/15/2020- stable left lower lobe mass, stable sclerotic lesion at L2, no evidence of disease progression Cycle 27 pembrolizumab 12/17/2020 PET scan 01/03/2021-1.8 x 1.3 cm left lower lobe nodule similar in size to CT of 12/15/2020 and measures smaller than previous PET/CT from 2020.  Nodule is markedly hypermetabolic.  No evidence for hypermetabolic hilar or mediastinal lymphadenopathy.  Several tiny foci of hypermetabolism identified in bony anatomy raising concern for skeletal metastases.  Comparison of the PET to CT 07/16/2020-lobular left lower lobe pulmonary nodule measured 2.4 x 1.5 cm on the prior study, current study it measured 1.8 x 1.3 cm.  Lesion in the anterior left acetabulum is similar to the 07/16/2020 exam although overlying cortical thinning slightly more pronounced on the current study.  Described lesion in the scapula shows some cortical sclerosis and a tiny central marrow lucency not substantially changed compared to 07/16/2020.  Left third rib lesion shows heterogeneous mineralization similar to 07/16/2020. MRI of cervical spine 01/13/2021-increased left facet edema at C5-C6, severe facet arthrosis on the left at C7-T1 and on the right at C3-C4, no evidence of metastatic disease Cycle 28 Pembrolizumab 01/14/2021 Cycle 29 Pembrolizumab 02/03/2021 Cycle 30 Pembrolizumab 02/24/2021 MRI left hip 03/02/2021-compared to 04/29/2019, slight increase in size of metastases at the left iliac and left  acetabulum.  Lesion at the left upper sacrum is less conspicuous and a lesion at the left superior pubic ramus is stable Cycle 31 pembrolizumab 03/17/2021 Radiation to left acetabulum and left iliac 03/30/2021-04/13/2021 Cycle 32 pembrolizumab 04/07/2021 Cycle 33 Pembrolizumab 04/28/2021 Cycle 34 pembrolizumab 05/18/2021 PET scan 06/01/2021-no significant change in size or degree of FDG uptake associated with FDG avid left lower lobe lung nodule compatible with neoplasm.  Stable to improved appearance of multifocal FDG avid bone metastasis. Cycle 35 pembrolizumab 06/10/2021 Cycle 36 pembrolizumab 07/01/2021 Cycle 37 pembrolizumab 07/29/2021 Cycle 38 pembrolizumab 08/19/2021 Pain secondary to #1, improved Chronic back pain Type 2 diabetes Essential hypertension CAD Hyperlipidemia Family history significant for multiple members with breast cancer Grade 1 skin rash 07/18/2019 likely related to immunotherapy.  Topical steroid cream as needed. E. coli urinary tract infection 07/14/2019.  Completed cephalexin. Edema/tenderness at the right greater than left ankle 10/21/2019-etiology unclear, potentially related to systemic therapy or an infection, doxycycline prescribed-improved 10/23/2019; marked improvement 11/20/2019; at office visit 01/02/2020 she reports worsening of lower extremity edema, pain/tenderness, erythema 3 to 4 days following each treatment.  Alimta held 01/02/2020.  Referral to dermatology. COVID-19 infection 03/30/2020, monoclonal antibody therapy 04/06/2020         Disposition: Ms. Youngberg appears unchanged.  She will complete another treatment with pembrolizumab today.  The right shoulder discomfort is most likely related to a benign musculoskeletal condition.  I reviewed the most recent pet imaging and I do not see a metastasis in this area.  She will call for increased shoulder pain.  Ms. Ayub will return for an office visit and pembrolizumab in 3 weeks.  [name],  MD  08/19/2021  10:55 AM

## 2021-09-03 ENCOUNTER — Other Ambulatory Visit: Payer: Self-pay | Admitting: Oncology

## 2021-09-09 ENCOUNTER — Inpatient Hospital Stay: Payer: Medicare Other

## 2021-09-09 ENCOUNTER — Inpatient Hospital Stay (HOSPITAL_BASED_OUTPATIENT_CLINIC_OR_DEPARTMENT_OTHER): Payer: Medicare Other | Admitting: Nurse Practitioner

## 2021-09-09 ENCOUNTER — Encounter: Payer: Self-pay | Admitting: Nurse Practitioner

## 2021-09-09 ENCOUNTER — Encounter: Payer: Self-pay | Admitting: *Deleted

## 2021-09-09 ENCOUNTER — Other Ambulatory Visit: Payer: Self-pay

## 2021-09-09 ENCOUNTER — Inpatient Hospital Stay: Payer: Medicare Other | Attending: Oncology

## 2021-09-09 VITALS — BP 122/51 | HR 61

## 2021-09-09 VITALS — BP 121/72 | HR 63 | Temp 97.7°F | Resp 20 | Ht 65.0 in | Wt 182.6 lb

## 2021-09-09 DIAGNOSIS — C3492 Malignant neoplasm of unspecified part of left bronchus or lung: Secondary | ICD-10-CM

## 2021-09-09 DIAGNOSIS — C7951 Secondary malignant neoplasm of bone: Secondary | ICD-10-CM | POA: Diagnosis present

## 2021-09-09 DIAGNOSIS — C3432 Malignant neoplasm of lower lobe, left bronchus or lung: Secondary | ICD-10-CM | POA: Insufficient documentation

## 2021-09-09 DIAGNOSIS — Z5112 Encounter for antineoplastic immunotherapy: Secondary | ICD-10-CM | POA: Diagnosis not present

## 2021-09-09 LAB — CMP (CANCER CENTER ONLY)
ALT: 13 U/L (ref 0–44)
AST: 16 U/L (ref 15–41)
Albumin: 4.1 g/dL (ref 3.5–5.0)
Alkaline Phosphatase: 79 U/L (ref 38–126)
Anion gap: 7 (ref 5–15)
BUN: 28 mg/dL — ABNORMAL HIGH (ref 8–23)
CO2: 26 mmol/L (ref 22–32)
Calcium: 9.4 mg/dL (ref 8.9–10.3)
Chloride: 103 mmol/L (ref 98–111)
Creatinine: 0.99 mg/dL (ref 0.44–1.00)
GFR, Estimated: 56 mL/min — ABNORMAL LOW (ref >60)
Glucose, Bld: 111 mg/dL — ABNORMAL HIGH (ref 70–99)
Potassium: 4.5 mmol/L (ref 3.5–5.1)
Sodium: 136 mmol/L (ref 135–145)
Total Bilirubin: 0.3 mg/dL (ref 0.3–1.2)
Total Protein: 6.6 g/dL (ref 6.5–8.1)

## 2021-09-09 MED ORDER — SODIUM CHLORIDE 0.9 % IV SOLN: Freq: Once | INTRAVENOUS | Status: AC

## 2021-09-09 MED ORDER — SODIUM CHLORIDE 0.9% FLUSH
10.0000 mL | INTRAVENOUS | Status: DC | PRN
  Administered 2021-09-09: 10 mL

## 2021-09-09 MED ORDER — SODIUM CHLORIDE 0.9 % IV SOLN
200.0000 mg | Freq: Once | INTRAVENOUS | Status: AC
  Administered 2021-09-09: 200 mg via INTRAVENOUS
  Filled 2021-09-09: qty 8

## 2021-09-09 MED ORDER — HEPARIN SOD (PORK) LOCK FLUSH 100 UNIT/ML IV SOLN
500.0000 [IU] | Freq: Once | INTRAVENOUS | Status: AC | PRN
  Administered 2021-09-09: 500 [IU]

## 2021-09-09 NOTE — Progress Notes (Signed)
Patient presents for treatment. RN assessment completed along with the following:  Labs/vitals reviewed - Yes, and within treatment parameters.   Weight within 10% of previous measurement - Yes Informed consent completed and reflects current therapy/intent - Yes, on date 06/06/19             Provider progress note reviewed - Yes, today's provider note was reviewed. Treatment/Antibody/Supportive plan reviewed - Yes, and there are no adjustments needed for today's treatment. S&H and other orders reviewed - Yes, and there are no additional orders identified. Previous treatment date reviewed - Yes, and the appropriate amount of time has elapsed between treatments. Clinic Hand Off Received from - susan C, RN  Patient to proceed with treatment.

## 2021-09-09 NOTE — Progress Notes (Signed)
[facility] OFFICE PROGRESS NOTE   Diagnosis: Non-small cell lung cancer  INTERVAL HISTORY:   [name] returns as scheduled.  She completed another cycle of Pembrolizumab 08/19/2021.  No diarrhea or rash.  No nausea or vomiting.  She has a good appetite.  She continues to have pain at the upper back/shoulder regions right greater than left.  The pain is unchanged as compared to 3 weeks ago.  She occasionally takes one half of a pain pill, not on a daily basis.  Objective:  Vital signs in last 24 hours:  Blood pressure 121/72, pulse 63, temperature 97.7 F (36.5 C), temperature source Oral, resp. rate 20, height $RemoveBe'5\' 5"'cvwmwvncO$  (1.651 m), weight 182 lb 9.6 oz (82.8 kg), SpO2 95 %.    HEENT: No thrush or ulcers. Resp: Lungs clear bilaterally. Cardio: Regular rate and rhythm. GI: Abdomen soft and nontender.  No hepatosplenomegaly. Vascular: No leg edema. Skin: No rash.   Lab Results:  Lab Results  Component Value Date   WBC 4.4 08/19/2021   HGB 11.3 (L) 08/19/2021   HCT 35.4 (L) 08/19/2021   MCV 89.6 08/19/2021   PLT 148 (L) 08/19/2021   NEUTROABS 3.0 08/19/2021    Imaging:  No results found.  Medications: I have reviewed the patient's current medications.  Assessment/Plan: Non-small cell lung cancer MRI lumbar spine 04/29/2019- enlarging marrow lesions involving the L1 vertebral body, upper left sacrum and right iliac bone MRI pelvis 04/29/2019- 3.5 cm left iliac bone lesion appears slightly larger; other similar appearing lesions present within the left superior pubic ramus, left superior abdomen acetabulum and upper left sacrum Kappa free light chains with mild elevation 05/12/2019  CTs 05/12/2019- left lower lobe pulmonary mass 3.3 x 3.2 cm; lytic process left iliac bone; spinal lesions; 1.1 cm low-density left kidney lesion; right thyroid enlargement with heterogeneous appearance with potential for multiple discrete lesions Biopsy left lower lobe lung mass  05/26/2019-poorly differentiated carcinoma; positive for cytokeratin 5/6, p63 and TTF-1, no EGFR, BRAF, ALK, ERBB2,ROS, or NTRK alteration Cycle 1 carboplatin/Alimta/pembrolizumab 06/06/2019 Cycle 2 carboplatin/Alimta/pembrolizumab 06/27/2019 Cycle 3 carboplatin/Alimta/pembrolizumab 07/18/2019 Cycle 4 carboplatin/Alimta/pembrolizumab 08/07/2019 CTs 08/27/2019-significant decrease in size of lobulated mass left lower lobe.  Unchanged appearance of subtle bone lesions. Cycle 5 Alimta/pembrolizumab 08/28/2019 Cycle 6 Alimta/pembrolizumab 09/18/2019 Cycle 7 Alimta/pembrolizumab 10/09/2019 Cycle 8 Alimta/pembrolizumab 10/30/2019 Cycle 9 Alimta/pembrolizumab 11/20/2019 CTs 12/09/2019-no evidence of disease progression, left lower lobe nodule slightly decreased in size, stable L1, left sacral, and left pubic ramus metastases Cycle 10 Alimta/pembrolizumab 12/11/2019 Cycle 11 pembrolizumab alone 01/02/2020 (Alimta held due to edema, tenderness, erythema at the lower legs) Cycle 12 pembrolizumab 01/23/2020 Cycle 13 pembrolizumab 02/12/2020 Cycle 14 pembrolizumab 03/04/2020 CTs 03/18/2020-stable left lower lobe lesion, mild sclerosis at the superior endplate of L1 that was previously hypermetabolic, stable small left upper sacral lucent lesion, previous left superior pubic ramus lesion is occult on the CT, CT head negative for malignancy Cycle 15 pembrolizumab 03/25/2020 Cycle 16 pembrolizumab 04/21/2020 Cycle 17 pembrolizumab 05/17/2020 05/19/2020 bone scan-no definite abnormalities to suggest osseous metastases.  Areas of concern on prior PET-CT involving left iliac bone and L1 vertebral body showed no abnormalities on the current study Cycle 18 Pembrolizumab 06/10/2020 Cycle 19 Pembrolizumab 06/30/2020 CTs 07/16/2020-stable left lower lobe nodule, stable faint superior L1 vertebral lesion, no evidence of disease progression Cycle 20 pembrolizumab 07/21/2020 Cycle 21 Pembrolizumab 08/11/2020 Cycle 22 pembrolizumab  09/01/2020 Cycle 23 Pembrolizumab 09/22/2020 Cycle 24 pembrolizumab 10/13/2020 Cycle 25 Pembrolizumab 11/03/2020 Cycle 26 pembrolizumab 11/26/2020 CTs 12/15/2020- stable left  lower lobe mass, stable sclerotic lesion at L2, no evidence of disease progression Cycle 27 pembrolizumab 12/17/2020 PET scan 01/03/2021-1.8 x 1.3 cm left lower lobe nodule similar in size to CT of 12/15/2020 and measures smaller than previous PET/CT from 2020.  Nodule is markedly hypermetabolic.  No evidence for hypermetabolic hilar or mediastinal lymphadenopathy.  Several tiny foci of hypermetabolism identified in bony anatomy raising concern for skeletal metastases.  Comparison of the PET to CT 07/16/2020-lobular left lower lobe pulmonary nodule measured 2.4 x 1.5 cm on the prior study, current study it measured 1.8 x 1.3 cm.  Lesion in the anterior left acetabulum is similar to the 07/16/2020 exam although overlying cortical thinning slightly more pronounced on the current study.  Described lesion in the scapula shows some cortical sclerosis and a tiny central marrow lucency not substantially changed compared to 07/16/2020.  Left third rib lesion shows heterogeneous mineralization similar to 07/16/2020. MRI of cervical spine 01/13/2021-increased left facet edema at C5-C6, severe facet arthrosis on the left at C7-T1 and on the right at C3-C4, no evidence of metastatic disease Cycle 28 Pembrolizumab 01/14/2021 Cycle 29 Pembrolizumab 02/03/2021 Cycle 30 Pembrolizumab 02/24/2021 MRI left hip 03/02/2021-compared to 04/29/2019, slight increase in size of metastases at the left iliac and left acetabulum.  Lesion at the left upper sacrum is less conspicuous and a lesion at the left superior pubic ramus is stable Cycle 31 pembrolizumab 03/17/2021 Radiation to left acetabulum and left iliac 03/30/2021-04/13/2021 Cycle 32 pembrolizumab 04/07/2021 Cycle 33 Pembrolizumab 04/28/2021 Cycle 34 pembrolizumab 05/18/2021 PET scan 06/01/2021-no significant change in  size or degree of FDG uptake associated with FDG avid left lower lobe lung nodule compatible with neoplasm.  Stable to improved appearance of multifocal FDG avid bone metastasis. Cycle 35 pembrolizumab 06/10/2021 Cycle 36 pembrolizumab 07/01/2021 Cycle 37 pembrolizumab 07/29/2021 Cycle 38 pembrolizumab 08/19/2021 Cycle 39 Pembrolizumab 09/09/2021 Pain secondary to #1, improved Chronic back pain Type 2 diabetes Essential hypertension CAD Hyperlipidemia Family history significant for multiple members with breast cancer Grade 1 skin rash 07/18/2019 likely related to immunotherapy.  Topical steroid cream as needed. E. coli urinary tract infection 07/14/2019.  Completed cephalexin. Edema/tenderness at the right greater than left ankle 10/21/2019-etiology unclear, potentially related to systemic therapy or an infection, doxycycline prescribed-improved 10/23/2019; marked improvement 11/20/2019; at office visit 01/02/2020 she reports worsening of lower extremity edema, pain/tenderness, erythema 3 to 4 days following each treatment.  Alimta held 01/02/2020.  Referral to dermatology. COVID-19 infection 03/30/2020, monoclonal antibody therapy 04/06/2020          Disposition: Ms. Schomburg appears unchanged.  She continues to tolerate Pembrolizumab well.  Plan to proceed with treatment today as scheduled.  Chemistry panel reviewed.  Adequate to proceed with treatment.  Etiology of shoulder pain is unclear.  Last office visit Dr. Benay Spice reviewed the most recent PET imaging and did not see metastasis in this area.  The pain is unchanged.  She will let us know if the pain worsens or she develops new symptoms.  She will return for lab, follow-up and Pembrolizumab in 3 weeks.  We are available to see her sooner if needed.    Ned Card ANP/GNP-BC   09/09/2021  9:51 AM

## 2021-09-09 NOTE — Progress Notes (Signed)
Patient seen by Ned Card NP today  Vitals are within treatment parameters.  Labs reviewed by Ned Card NP and are within treatment parameters. No need for CBC today.  Per physician team, patient is ready for treatment and there are NO modifications to the treatment plan.

## 2021-09-09 NOTE — Patient Instructions (Signed)
[facility]   Discharge Instructions: Thank you for choosing [facility] to provide your oncology and hematology care.   If you have a lab appointment with the [facility], please go directly to the [facility] and check in at the registration area.   Wear comfortable clothing and clothing appropriate for easy access to any Portacath or PICC line.   We strive to give you quality time with your provider. You may need to reschedule your appointment if you arrive late (15 or more minutes).  Arriving late affects you and other patients whose appointments are after yours.  Also, if you miss three or more appointments without notifying the office, you may be dismissed from the clinic at the providers discretion.      For prescription refill requests, have your pharmacy contact our office and allow 72 hours for refills to be completed.    Today you received the following chemotherapy and/or immunotherapy agents Keytruda      To help prevent nausea and vomiting after your treatment, we encourage you to take your nausea medication as directed.  BELOW ARE SYMPTOMS THAT SHOULD BE REPORTED IMMEDIATELY: *FEVER GREATER THAN 100.4 F (38 C) OR HIGHER *CHILLS OR SWEATING *NAUSEA AND VOMITING THAT IS NOT CONTROLLED WITH YOUR NAUSEA MEDICATION *UNUSUAL SHORTNESS OF BREATH *UNUSUAL BRUISING OR BLEEDING *URINARY PROBLEMS (pain or burning when urinating, or frequent urination) *BOWEL PROBLEMS (unusual diarrhea, constipation, pain near the anus) TENDERNESS IN MOUTH AND THROAT WITH OR WITHOUT PRESENCE OF ULCERS (sore throat, sores in mouth, or a toothache) UNUSUAL RASH, SWELLING OR PAIN  UNUSUAL VAGINAL DISCHARGE OR ITCHING   Items with * indicate a potential emergency and should be followed up as soon as possible or go to the Emergency Department if any problems should occur.  Please show the CHEMOTHERAPY ALERT CARD or IMMUNOTHERAPY ALERT CARD at check-in to the  Emergency Department and triage nurse.  Should you have questions after your visit or need to cancel or reschedule your appointment, please contact [facility]  Dept: [phone]  and follow the prompts.  Office hours are 8:00 a.m. to 4:30 p.m. Monday - Friday. Please note that voicemails left after 4:00 p.m. may not be returned until the following business day.  We are closed weekends and major holidays. You have access to a nurse at all times for urgent questions. Please call the main number to the clinic Dept: [phone] and follow the prompts.   For any non-urgent questions, you may also contact your provider using MyChart. We now offer e-Visits for anyone 42 and older to request care online for non-urgent symptoms. For details visit [email or web address].   Also download the MyChart app! Go to the app store, search "MyChart", open the app, select [facility], and log in with your MyChart username and password.  Due to Covid, a mask is required upon entering the hospital/clinic. If you do not have a mask, one will be given to you upon arrival. For doctor visits, patients may have 1 support person aged 80 or older with them. For treatment visits, patients cannot have anyone with them due to current Covid guidelines and our immunocompromised population.   Pembrolizumab injection What is this medication? PEMBROLIZUMAB (pem broe liz ue mab) is a monoclonal antibody. It is used to treat certain types of cancer. This medicine may be used for other purposes; ask your health care provider or pharmacist if you have questions. COMMON BRAND NAME(S): [employer]  What should I tell my care team before I take this medication? They need to know if you have any of these conditions: autoimmune diseases like Crohn's disease, ulcerative colitis, or lupus have had or planning to have an allogeneic stem cell transplant (uses someone else's stem cells) history of organ  transplant history of chest radiation nervous system problems like myasthenia gravis or Guillain-Barre syndrome an unusual or allergic reaction to pembrolizumab, other medicines, foods, dyes, or preservatives pregnant or trying to get pregnant breast-feeding How should I use this medication? This medicine is for infusion into a vein. It is given by a health care professional in a hospital or clinic setting. A special MedGuide will be given to you before each treatment. Be sure to read this information carefully each time. Talk to your pediatrician regarding the use of this medicine in children. While this drug may be prescribed for children as young as 6 months for selected conditions, precautions do apply. Overdosage: If you think you have taken too much of this medicine contact a poison control center or emergency room at once. NOTE: This medicine is only for you. Do not share this medicine with others. What if I miss a dose? It is important not to miss your dose. Call your doctor or health care professional if you are unable to keep an appointment. What may interact with this medication? Interactions have not been studied. This list may not describe all possible interactions. Give your health care provider a list of all the medicines, herbs, non-prescription drugs, or dietary supplements you use. Also tell them if you smoke, drink alcohol, or use illegal drugs. Some items may interact with your medicine. What should I watch for while using this medication? Your condition will be monitored carefully while you are receiving this medicine. You may need blood work done while you are taking this medicine. Do not become pregnant while taking this medicine or for 4 months after stopping it. Women should inform their doctor if they wish to become pregnant or think they might be pregnant. There is a potential for serious side effects to an unborn child. Talk to your health care professional or  pharmacist for more information. Do not breast-feed an infant while taking this medicine or for 4 months after the last dose. What side effects may I notice from receiving this medication? Side effects that you should report to your doctor or health care professional as soon as possible: allergic reactions like skin rash, itching or hives, swelling of the face, lips, or tongue bloody or black, tarry breathing problems changes in vision chest pain chills confusion constipation cough diarrhea dizziness or feeling faint or lightheaded fast or irregular heartbeat fever flushing joint pain low blood counts - this medicine may decrease the number of white blood cells, red blood cells and platelets. You may be at increased risk for infections and bleeding. muscle pain muscle weakness pain, tingling, numbness in the hands or feet persistent headache redness, blistering, peeling or loosening of the skin, including inside the mouth signs and symptoms of high blood sugar such as dizziness; dry mouth; dry skin; fruity breath; nausea; stomach pain; increased hunger or thirst; increased urination signs and symptoms of kidney injury like trouble passing urine or change in the amount of urine signs and symptoms of liver injury like dark urine, light-colored stools, loss of appetite, nausea, right upper belly pain, yellowing of the eyes or skin sweating swollen lymph nodes weight loss Side effects that usually do not require  medical attention (report to your doctor or health care professional if they continue or are bothersome): decreased appetite hair loss tiredness This list may not describe all possible side effects. Call your doctor for medical advice about side effects. You may report side effects to FDA at 1-800-FDA-1088. Where should I keep my medication? This drug is given in a hospital or clinic and will not be stored at home. NOTE: This sheet is a summary. It may not cover all possible  information. If you have questions about this medicine, talk to your doctor, pharmacist, or health care provider.  2022 Elsevier/Gold Standard (2021-04-05 00:00:00)

## 2021-09-22 ENCOUNTER — Encounter: Payer: Self-pay | Admitting: Nurse Practitioner

## 2021-09-25 ENCOUNTER — Other Ambulatory Visit: Payer: Self-pay | Admitting: Oncology

## 2021-09-30 ENCOUNTER — Encounter: Payer: Self-pay | Admitting: *Deleted

## 2021-09-30 ENCOUNTER — Inpatient Hospital Stay: Payer: Medicare Other

## 2021-09-30 ENCOUNTER — Inpatient Hospital Stay: Payer: Medicare Other | Admitting: Oncology

## 2021-09-30 ENCOUNTER — Other Ambulatory Visit: Payer: Self-pay

## 2021-09-30 ENCOUNTER — Inpatient Hospital Stay: Payer: Medicare Other | Attending: Oncology

## 2021-09-30 VITALS — BP 131/64 | HR 68 | Temp 97.9°F | Resp 18 | Ht 65.0 in | Wt 186.0 lb

## 2021-09-30 DIAGNOSIS — C3492 Malignant neoplasm of unspecified part of left bronchus or lung: Secondary | ICD-10-CM

## 2021-09-30 DIAGNOSIS — C7951 Secondary malignant neoplasm of bone: Secondary | ICD-10-CM | POA: Insufficient documentation

## 2021-09-30 DIAGNOSIS — C3432 Malignant neoplasm of lower lobe, left bronchus or lung: Secondary | ICD-10-CM | POA: Diagnosis present

## 2021-09-30 DIAGNOSIS — Z5112 Encounter for antineoplastic immunotherapy: Secondary | ICD-10-CM | POA: Insufficient documentation

## 2021-09-30 DIAGNOSIS — Z79899 Other long term (current) drug therapy: Secondary | ICD-10-CM | POA: Diagnosis not present

## 2021-09-30 LAB — CBC WITH DIFFERENTIAL (CANCER CENTER ONLY)
Abs Immature Granulocytes: 0.02 10*3/uL (ref 0.00–0.07)
Basophils Absolute: 0 10*3/uL (ref 0.0–0.1)
Basophils Relative: 0 %
Eosinophils Absolute: 0 10*3/uL (ref 0.0–0.5)
Eosinophils Relative: 1 %
HCT: 35.3 % — ABNORMAL LOW (ref 36.0–46.0)
Hemoglobin: 11.3 g/dL — ABNORMAL LOW (ref 12.0–15.0)
Immature Granulocytes: 0 %
Lymphocytes Relative: 16 %
Lymphs Abs: 0.7 10*3/uL (ref 0.7–4.0)
MCH: 28.9 pg (ref 26.0–34.0)
MCHC: 32 g/dL (ref 30.0–36.0)
MCV: 90.3 fL (ref 80.0–100.0)
Monocytes Absolute: 0.4 10*3/uL (ref 0.1–1.0)
Monocytes Relative: 10 %
Neutro Abs: 3.3 10*3/uL (ref 1.7–7.7)
Neutrophils Relative %: 73 %
Platelet Count: 170 10*3/uL (ref 150–400)
RBC: 3.91 MIL/uL (ref 3.87–5.11)
RDW: 14.4 % (ref 11.5–15.5)
WBC Count: 4.5 10*3/uL (ref 4.0–10.5)
nRBC: 0 % (ref 0.0–0.2)

## 2021-09-30 LAB — CMP (CANCER CENTER ONLY)
ALT: 16 U/L (ref 0–44)
AST: 18 U/L (ref 15–41)
Albumin: 4.2 g/dL (ref 3.5–5.0)
Alkaline Phosphatase: 73 U/L (ref 38–126)
Anion gap: 7 (ref 5–15)
BUN: 27 mg/dL — ABNORMAL HIGH (ref 8–23)
CO2: 26 mmol/L (ref 22–32)
Calcium: 9.9 mg/dL (ref 8.9–10.3)
Chloride: 102 mmol/L (ref 98–111)
Creatinine: 0.97 mg/dL (ref 0.44–1.00)
GFR, Estimated: 58 mL/min — ABNORMAL LOW (ref >60)
Glucose, Bld: 99 mg/dL (ref 70–99)
Potassium: 4.8 mmol/L (ref 3.5–5.1)
Sodium: 135 mmol/L (ref 135–145)
Total Bilirubin: 0.4 mg/dL (ref 0.3–1.2)
Total Protein: 7 g/dL (ref 6.5–8.1)

## 2021-09-30 LAB — TSH: TSH: 0.837 u[IU]/mL (ref 0.350–4.500)

## 2021-09-30 MED ORDER — SODIUM CHLORIDE 0.9% FLUSH
10.0000 mL | INTRAVENOUS | Status: DC | PRN
  Administered 2021-09-30: 10 mL

## 2021-09-30 MED ORDER — SODIUM CHLORIDE 0.9 % IV SOLN: Freq: Once | INTRAVENOUS | Status: AC

## 2021-09-30 MED ORDER — SODIUM CHLORIDE 0.9 % IV SOLN
200.0000 mg | Freq: Once | INTRAVENOUS | Status: AC
  Administered 2021-09-30: 200 mg via INTRAVENOUS
  Filled 2021-09-30: qty 8

## 2021-09-30 MED ORDER — HEPARIN SOD (PORK) LOCK FLUSH 100 UNIT/ML IV SOLN
500.0000 [IU] | Freq: Once | INTRAVENOUS | Status: AC | PRN
  Administered 2021-09-30: 500 [IU]

## 2021-09-30 NOTE — Progress Notes (Signed)
[facility] OFFICE PROGRESS NOTE   Diagnosis: Non-small cell lung cancer  INTERVAL HISTORY:   [name] completed another treatment with pembrolizumab on 09/09/2021.  She tolerated treatment well.  No rash or diarrhea.  She continues to have pain in the joints, chiefly the shoulders.  The pain is partially relieved with Tylenol and naproxen.  Objective:  Vital signs in last 24 hours:  Blood pressure 131/64, pulse 68, temperature 97.9 F (36.6 C), temperature source Oral, resp. rate 18, height '5\' 5"'  (1.651 m), weight 186 lb (84.4 kg), SpO2 98 %.   Resp: Inspiratory rhonchi at the lower posterior chest bilaterally, no respiratory distress Cardio: Regular rate and rhythm GI: No hepatosplenomegaly, nontender Vascular: No leg edema Musculoskeletal: Pain with motion at both shoulders  Portacath/PICC-without erythema  Lab Results:  Lab Results  Component Value Date   WBC 4.5 09/30/2021   HGB 11.3 (L) 09/30/2021   HCT 35.3 (L) 09/30/2021   MCV 90.3 09/30/2021   PLT 170 09/30/2021   NEUTROABS 3.3 09/30/2021    CMP  Lab Results  Component Value Date   NA 135 09/30/2021   K 4.8 09/30/2021   CL 102 09/30/2021   CO2 26 09/30/2021   GLUCOSE 99 09/30/2021   BUN 27 (H) 09/30/2021   CREATININE 0.97 09/30/2021   CALCIUM 9.9 09/30/2021   PROT 7.0 09/30/2021   ALBUMIN 4.2 09/30/2021   AST 18 09/30/2021   ALT 16 09/30/2021   ALKPHOS 73 09/30/2021   BILITOT 0.4 09/30/2021   GFRNONAA 58 (L) 09/30/2021   GFRAA >60 04/21/2020    No results found for: CEA1, CEA, CAN199, CA125  Lab Results  Component Value Date   INR 1.0 07/12/2021   LABPROT 12.7 07/12/2021    Imaging:  No results found.  Medications: I have reviewed the patient's current medications.   Assessment/Plan: Non-small cell lung cancer MRI lumbar spine 04/29/2019- enlarging marrow lesions involving the L1 vertebral body, upper left sacrum and right iliac bone MRI pelvis 04/29/2019- 3.5 cm  left iliac bone lesion appears slightly larger; other similar appearing lesions present within the left superior pubic ramus, left superior abdomen acetabulum and upper left sacrum Kappa free light chains with mild elevation 05/12/2019  CTs 05/12/2019- left lower lobe pulmonary mass 3.3 x 3.2 cm; lytic process left iliac bone; spinal lesions; 1.1 cm low-density left kidney lesion; right thyroid enlargement with heterogeneous appearance with potential for multiple discrete lesions Biopsy left lower lobe lung mass 05/26/2019-poorly differentiated carcinoma; positive for cytokeratin 5/6, p63 and TTF-1, no EGFR, BRAF, ALK, ERBB2,ROS, or NTRK alteration Cycle 1 carboplatin/Alimta/pembrolizumab 06/06/2019 Cycle 2 carboplatin/Alimta/pembrolizumab 06/27/2019 Cycle 3 carboplatin/Alimta/pembrolizumab 07/18/2019 Cycle 4 carboplatin/Alimta/pembrolizumab 08/07/2019 CTs 08/27/2019-significant decrease in size of lobulated mass left lower lobe.  Unchanged appearance of subtle bone lesions. Cycle 5 Alimta/pembrolizumab 08/28/2019 Cycle 6 Alimta/pembrolizumab 09/18/2019 Cycle 7 Alimta/pembrolizumab 10/09/2019 Cycle 8 Alimta/pembrolizumab 10/30/2019 Cycle 9 Alimta/pembrolizumab 11/20/2019 CTs 12/09/2019-no evidence of disease progression, left lower lobe nodule slightly decreased in size, stable L1, left sacral, and left pubic ramus metastases Cycle 10 Alimta/pembrolizumab 12/11/2019 Cycle 11 pembrolizumab alone 01/02/2020 (Alimta held due to edema, tenderness, erythema at the lower legs) Cycle 12 pembrolizumab 01/23/2020 Cycle 13 pembrolizumab 02/12/2020 Cycle 14 pembrolizumab 03/04/2020 CTs 03/18/2020-stable left lower lobe lesion, mild sclerosis at the superior endplate of L1 that was previously hypermetabolic, stable small left upper sacral lucent lesion, previous left superior pubic ramus lesion is occult on the CT, CT head negative for malignancy Cycle 15 pembrolizumab 03/25/2020 Cycle 16 pembrolizumab  04/21/2020 Cycle 17  pembrolizumab 05/17/2020 05/19/2020 bone scan-no definite abnormalities to suggest osseous metastases.  Areas of concern on prior PET-CT involving left iliac bone and L1 vertebral body showed no abnormalities on the current study Cycle 18 Pembrolizumab 06/10/2020 Cycle 19 Pembrolizumab 06/30/2020 CTs 07/16/2020-stable left lower lobe nodule, stable faint superior L1 vertebral lesion, no evidence of disease progression Cycle 20 pembrolizumab 07/21/2020 Cycle 21 Pembrolizumab 08/11/2020 Cycle 22 pembrolizumab 09/01/2020 Cycle 23 Pembrolizumab 09/22/2020 Cycle 24 pembrolizumab 10/13/2020 Cycle 25 Pembrolizumab 11/03/2020 Cycle 26 pembrolizumab 11/26/2020 CTs 12/15/2020- stable left lower lobe mass, stable sclerotic lesion at L2, no evidence of disease progression Cycle 27 pembrolizumab 12/17/2020 PET scan 01/03/2021-1.8 x 1.3 cm left lower lobe nodule similar in size to CT of 12/15/2020 and measures smaller than previous PET/CT from 2020.  Nodule is markedly hypermetabolic.  No evidence for hypermetabolic hilar or mediastinal lymphadenopathy.  Several tiny foci of hypermetabolism identified in bony anatomy raising concern for skeletal metastases.  Comparison of the PET to CT 07/16/2020-lobular left lower lobe pulmonary nodule measured 2.4 x 1.5 cm on the prior study, current study it measured 1.8 x 1.3 cm.  Lesion in the anterior left acetabulum is similar to the 07/16/2020 exam although overlying cortical thinning slightly more pronounced on the current study.  Described lesion in the scapula shows some cortical sclerosis and a tiny central marrow lucency not substantially changed compared to 07/16/2020.  Left third rib lesion shows heterogeneous mineralization similar to 07/16/2020. MRI of cervical spine 01/13/2021-increased left facet edema at C5-C6, severe facet arthrosis on the left at C7-T1 and on the right at C3-C4, no evidence of metastatic disease Cycle 28 Pembrolizumab 01/14/2021 Cycle 29 Pembrolizumab  02/03/2021 Cycle 30 Pembrolizumab 02/24/2021 MRI left hip 03/02/2021-compared to 04/29/2019, slight increase in size of metastases at the left iliac and left acetabulum.  Lesion at the left upper sacrum is less conspicuous and a lesion at the left superior pubic ramus is stable Cycle 31 pembrolizumab 03/17/2021 Radiation to left acetabulum and left iliac 03/30/2021-04/13/2021 Cycle 32 pembrolizumab 04/07/2021 Cycle 33 Pembrolizumab 04/28/2021 Cycle 34 pembrolizumab 05/18/2021 PET scan 06/01/2021-no significant change in size or degree of FDG uptake associated with FDG avid left lower lobe lung nodule compatible with neoplasm.  Stable to improved appearance of multifocal FDG avid bone metastasis. Cycle 35 pembrolizumab 06/10/2021 Cycle 36 pembrolizumab 07/01/2021 Cycle 37 pembrolizumab 07/29/2021 Cycle 38 pembrolizumab 08/19/2021 Cycle 39 Pembrolizumab 09/09/2021 Cycle 40 pembrolizumab 09/30/2021 Pain secondary to #1, improved Chronic back pain Type 2 diabetes Essential hypertension CAD Hyperlipidemia Family history significant for multiple members with breast cancer Grade 1 skin rash 07/18/2019 likely related to immunotherapy.  Topical steroid cream as needed. E. coli urinary tract infection 07/14/2019.  Completed cephalexin. Edema/tenderness at the right greater than left ankle 10/21/2019-etiology unclear, potentially related to systemic therapy or an infection, doxycycline prescribed-improved 10/23/2019; marked improvement 11/20/2019; at office visit 01/02/2020 she reports worsening of lower extremity edema, pain/tenderness, erythema 3 to 4 days following each treatment.  Alimta held 01/02/2020.  Referral to dermatology. COVID-19 infection 03/30/2020, monoclonal antibody therapy 04/06/2020           Disposition: [name] has a history of metastatic non-small cell lung cancer.  She continues treatment with pembrolizumab.  There is no clinical evidence for progression of the lung cancer.  She continues to  have arthralgias.  I have a low clinical suspicion for development of rheumatoid arthritis secondary to pembrolizumab.  She is scheduled to see rheumatology within the next few weeks.  The  plan is to continue pembrolizumab for now.  She will try hydrocodone as needed for pain.  [name], MD  09/30/2021  11:01 AM

## 2021-09-30 NOTE — Progress Notes (Signed)
Patient seen by Dr. Sherrill today ? ?Vitals are within treatment parameters. ? ?Labs reviewed by Dr. Sherrill and are within treatment parameters. ? ?Per physician team, patient is ready for treatment and there are NO modifications to the treatment plan.  ?

## 2021-09-30 NOTE — Progress Notes (Signed)
Patient presents for treatment. RN assessment completed along with the following: ? ?Labs/vitals reviewed - Yes, and Please see collab nurse note.    ?Weight within 10% of previous measurement - Yes ?Informed consent completed and reflects current therapy/intent - Yes, on date 06/06/2019             ?Provider progress note reviewed - Yes, today's provider note was reviewed. ?Treatment/Antibody/Supportive plan reviewed - Yes, and there are no adjustments needed for today's treatment. ?S&H and other orders reviewed - Yes, and there are no additional orders identified. ?Previous treatment date reviewed - Yes, and the appropriate amount of time has elapsed between treatments. ?Clinic Hand Off Received from - Yes from [place], RN ? ?Patient to proceed with treatment.  ? ?

## 2021-09-30 NOTE — Patient Instructions (Addendum)
[place]   ?Discharge Instructions: ?Thank you for choosing [facility] to provide your oncology and hematology care.  ? ?If you have a lab appointment with the [facility], please go directly to the [facility] and check in at the registration area. ?  ?Wear comfortable clothing and clothing appropriate for easy access to any Portacath or PICC line.  ? ?We strive to give you quality time with your provider. You may need to reschedule your appointment if you arrive late (15 or more minutes).  Arriving late affects you and other patients whose appointments are after yours.  Also, if you miss three or more appointments without notifying the office, you may be dismissed from the clinic at the provider?s discretion.    ?  ?For prescription refill requests, have your pharmacy contact our office and allow 72 hours for refills to be completed.   ? ?Today you received the following chemotherapy and/or immunotherapy agents Pembrolizumab (KEYTRUDA).    ?  ?To help prevent nausea and vomiting after your treatment, we encourage you to take your nausea medication as directed. ? ?BELOW ARE SYMPTOMS THAT SHOULD BE REPORTED IMMEDIATELY: ?*FEVER GREATER THAN 100.4 F (38 ?C) OR HIGHER ?*CHILLS OR SWEATING ?*NAUSEA AND VOMITING THAT IS NOT CONTROLLED WITH YOUR NAUSEA MEDICATION ?*UNUSUAL SHORTNESS OF BREATH ?*UNUSUAL BRUISING OR BLEEDING ?*URINARY PROBLEMS (pain or burning when urinating, or frequent urination) ?*BOWEL PROBLEMS (unusual diarrhea, constipation, pain near the anus) ?TENDERNESS IN MOUTH AND THROAT WITH OR WITHOUT PRESENCE OF ULCERS (sore throat, sores in mouth, or a toothache) ?UNUSUAL RASH, SWELLING OR PAIN  ?UNUSUAL VAGINAL DISCHARGE OR ITCHING  ? ?Items with * indicate a potential emergency and should be followed up as soon as possible or go to the Emergency Department if any problems should occur. ? ?Please show the CHEMOTHERAPY ALERT CARD or IMMUNOTHERAPY ALERT CARD  at check-in to the Emergency Department and triage nurse. ? ?Should you have questions after your visit or need to cancel or reschedule your appointment, please contact [facility]  Dept: [phone]  and follow the prompts.  Office hours are 8:00 a.m. to 4:30 p.m. Monday - Friday. Please note that voicemails left after 4:00 p.m. may not be returned until the following business day.  We are closed weekends and major holidays. You have access to a nurse at all times for urgent questions. Please call the main number to the clinic Dept: [phone] and follow the prompts. ? ? ?For any non-urgent questions, you may also contact your provider using MyChart. We now offer e-Visits for anyone 91 and older to request care online for non-urgent symptoms. For details visit [email or web address]. ?  ?Also download the MyChart app! Go to the app store, search "MyChart", open the app, select [facility], and log in with your MyChart username and password. ? ?Due to Covid, a mask is required upon entering the hospital/clinic. If you do not have a mask, one will be given to you upon arrival. For doctor visits, patients may have 1 support person aged 22 or older with them. For treatment visits, patients cannot have anyone with them due to current Covid guidelines and our immunocompromised population.  ? ?Pembrolizumab injection ?What is this medication? ?PEMBROLIZUMAB (pem broe liz ue mab) is a monoclonal antibody. It is used to treat certain types of cancer. ?This medicine may be used for other purposes; ask your health care provider or pharmacist if you have questions. ?COMMON BRAND NAME(S):  Keytruda ?What should I tell my care team before I take this medication? ?They need to know if you have any of these conditions: ?autoimmune diseases like Crohn's disease, ulcerative colitis, or lupus ?have had or planning to have an allogeneic stem cell transplant (uses someone else's stem cells) ?history of  organ transplant ?history of chest radiation ?nervous system problems like myasthenia gravis or Guillain-Barre syndrome ?an unusual or allergic reaction to pembrolizumab, other medicines, foods, dyes, or preservatives ?pregnant or trying to get pregnant ?breast-feeding ?How should I use this medication? ?This medicine is for infusion into a vein. It is given by a health care professional in a hospital or clinic setting. ?A special MedGuide will be given to you before each treatment. Be sure to read this information carefully each time. ?Talk to your pediatrician regarding the use of this medicine in children. While this drug may be prescribed for children as young as 6 months for selected conditions, precautions do apply. ?Overdosage: If you think you have taken too much of this medicine contact a poison control center or emergency room at once. ?NOTE: This medicine is only for you. Do not share this medicine with others. ?What if I miss a dose? ?It is important not to miss your dose. Call your doctor or health care professional if you are unable to keep an appointment. ?What may interact with this medication? ?Interactions have not been studied. ?This list may not describe all possible interactions. Give your health care provider a list of all the medicines, herbs, non-prescription drugs, or dietary supplements you use. Also tell them if you smoke, drink alcohol, or use illegal drugs. Some items may interact with your medicine. ?What should I watch for while using this medication? ?Your condition will be monitored carefully while you are receiving this medicine. ?You may need blood work done while you are taking this medicine. ?Do not become pregnant while taking this medicine or for 4 months after stopping it. Women should inform their doctor if they wish to become pregnant or think they might be pregnant. There is a potential for serious side effects to an unborn child. Talk to your health care professional or  pharmacist for more information. Do not breast-feed an infant while taking this medicine or for 4 months after the last dose. ?What side effects may I notice from receiving this medication? ?Side effects that you should report to your doctor or health care professional as soon as possible: ?allergic reactions like skin rash, itching or hives, swelling of the face, lips, or tongue ?bloody or black, tarry ?breathing problems ?changes in vision ?chest pain ?chills ?confusion ?constipation ?cough ?diarrhea ?dizziness or feeling faint or lightheaded ?fast or irregular heartbeat ?fever ?flushing ?joint pain ?low blood counts - this medicine may decrease the number of white blood cells, red blood cells and platelets. You may be at increased risk for infections and bleeding. ?muscle pain ?muscle weakness ?pain, tingling, numbness in the hands or feet ?persistent headache ?redness, blistering, peeling or loosening of the skin, including inside the mouth ?signs and symptoms of high blood sugar such as dizziness; dry mouth; dry skin; fruity breath; nausea; stomach pain; increased hunger or thirst; increased urination ?signs and symptoms of kidney injury like trouble passing urine or change in the amount of urine ?signs and symptoms of liver injury like dark urine, light-colored stools, loss of appetite, nausea, right upper belly pain, yellowing of the eyes or skin ?sweating ?swollen lymph nodes ?weight loss ?Side effects that usually do not  require medical attention (report to your doctor or health care professional if they continue or are bothersome): ?decreased appetite ?hair loss ?tiredness ?This list may not describe all possible side effects. Call your doctor for medical advice about side effects. You may report side effects to FDA at 1-800-FDA-1088. ?Where should I keep my medication? ?This drug is given in a hospital or clinic and will not be stored at home. ?NOTE: This sheet is a summary. It may not cover all possible  information. If you have questions about this medicine, talk to your doctor, pharmacist, or health care provider. ?? 2022 Elsevier/Gold Standard (2021-04-05 00:00:00) ? ? ?

## 2021-10-05 ENCOUNTER — Other Ambulatory Visit: Payer: Self-pay | Admitting: Cardiovascular Disease

## 2021-10-16 ENCOUNTER — Other Ambulatory Visit: Payer: Self-pay | Admitting: Oncology

## 2021-10-18 ENCOUNTER — Encounter: Payer: Self-pay | Admitting: Cardiovascular Disease

## 2021-10-20 ENCOUNTER — Emergency Department (HOSPITAL_BASED_OUTPATIENT_CLINIC_OR_DEPARTMENT_OTHER): Payer: Medicare Other

## 2021-10-20 ENCOUNTER — Emergency Department (HOSPITAL_BASED_OUTPATIENT_CLINIC_OR_DEPARTMENT_OTHER)
Admission: EM | Admit: 2021-10-20 | Discharge: 2021-10-20 | Disposition: A | Discharge status: Home / Self Care | Payer: Medicare Other | Attending: Emergency Medicine | Admitting: Emergency Medicine

## 2021-10-20 ENCOUNTER — Emergency Department (HOSPITAL_BASED_OUTPATIENT_CLINIC_OR_DEPARTMENT_OTHER): Payer: Medicare Other | Admitting: Radiology

## 2021-10-20 ENCOUNTER — Encounter (HOSPITAL_BASED_OUTPATIENT_CLINIC_OR_DEPARTMENT_OTHER): Payer: Self-pay | Admitting: Obstetrics and Gynecology

## 2021-10-20 ENCOUNTER — Other Ambulatory Visit: Payer: Self-pay

## 2021-10-20 DIAGNOSIS — E119 Type 2 diabetes mellitus without complications: Secondary | ICD-10-CM | POA: Diagnosis not present

## 2021-10-20 DIAGNOSIS — Z7984 Long term (current) use of oral hypoglycemic drugs: Secondary | ICD-10-CM | POA: Diagnosis not present

## 2021-10-20 DIAGNOSIS — I2510 Atherosclerotic heart disease of native coronary artery without angina pectoris: Secondary | ICD-10-CM | POA: Diagnosis not present

## 2021-10-20 DIAGNOSIS — I10 Essential (primary) hypertension: Secondary | ICD-10-CM | POA: Diagnosis not present

## 2021-10-20 DIAGNOSIS — R0789 Other chest pain: Secondary | ICD-10-CM | POA: Insufficient documentation

## 2021-10-20 DIAGNOSIS — R079 Chest pain, unspecified: Secondary | ICD-10-CM

## 2021-10-20 DIAGNOSIS — Z85118 Personal history of other malignant neoplasm of bronchus and lung: Secondary | ICD-10-CM | POA: Diagnosis not present

## 2021-10-20 DIAGNOSIS — Z79899 Other long term (current) drug therapy: Secondary | ICD-10-CM | POA: Diagnosis not present

## 2021-10-20 DIAGNOSIS — R918 Other nonspecific abnormal finding of lung field: Secondary | ICD-10-CM

## 2021-10-20 LAB — BASIC METABOLIC PANEL
Anion gap: 9 (ref 5–15)
BUN: 29 mg/dL — ABNORMAL HIGH (ref 8–23)
CO2: 24 mmol/L (ref 22–32)
Calcium: 10.2 mg/dL (ref 8.9–10.3)
Chloride: 104 mmol/L (ref 98–111)
Creatinine, Ser: 0.99 mg/dL (ref 0.44–1.00)
GFR, Estimated: 56 mL/min — ABNORMAL LOW (ref >60)
Glucose, Bld: 115 mg/dL — ABNORMAL HIGH (ref 70–99)
Potassium: 4.8 mmol/L (ref 3.5–5.1)
Sodium: 137 mmol/L (ref 135–145)

## 2021-10-20 LAB — CBC
HCT: 38 % (ref 36.0–46.0)
Hemoglobin: 12.3 g/dL (ref 12.0–15.0)
MCH: 29.2 pg (ref 26.0–34.0)
MCHC: 32.4 g/dL (ref 30.0–36.0)
MCV: 90.3 fL (ref 80.0–100.0)
Platelets: 197 10*3/uL (ref 150–400)
RBC: 4.21 MIL/uL (ref 3.87–5.11)
RDW: 15.1 % (ref 11.5–15.5)
WBC: 5.3 10*3/uL (ref 4.0–10.5)
nRBC: 0 % (ref 0.0–0.2)

## 2021-10-20 LAB — TROPONIN I (HIGH SENSITIVITY)
Troponin I (High Sensitivity): 3 ng/L (ref <18)
Troponin I (High Sensitivity): 3 ng/L (ref <18)

## 2021-10-20 MED ORDER — IOHEXOL 350 MG/ML SOLN
100.0000 mL | Freq: Once | INTRAVENOUS | Status: AC | PRN
  Administered 2021-10-20: 75 mL via INTRAVENOUS

## 2021-10-20 NOTE — ED Triage Notes (Signed)
Patient reports she has a hx of lung cancer and Angina and has had x5 episodes of angina in the past 48 hours. ?

## 2021-10-20 NOTE — ED Provider Notes (Signed)
87 yo female w/ stage 4 lung cancer, chronic chest pain, here with worsening chest pain, relieved with nitro. ? ?Pending 2nd trop, CT PE study ? ?Delta troponin is flat and unremarkable.  The patient remains pain-free and wanted to go home.  The PE study shows no acute pulmonary embolism or pneumonia per my interpretation, there is some mild enlargement of her known pulmonary malignancy, but no significant mass effect.  I think she is reasonably safe for discharge home at this time. ?  ?Wyvonnia Dusky, MD ?10/20/21 1657 ? ?

## 2021-10-20 NOTE — ED Provider Notes (Signed)
?[facility] ?Provider Note ? ? ?CSN: [id number] ?Arrival date & time: 10/20/21  1209 ? ?  ? ?History ? ?Chief Complaint  ?Patient presents with  ? Chest Pain  ? ? ?[name] is a 87 y.o. female. ? ?HPI ? ?87 year old female with past medical history of stage IV lung cancer, HTN, HLD, DM, CAD presents to the emergency department with concern for chest pain.  Patient states this morning she had an episode of midsternal chest discomfort that was relieved with 2 nitro.  She is having increased frequency of the symptoms, her last episode being yesterday.  She called her cardiologist who referred her here for further evaluation.  Currently she is chest pain-free.  She has intermittently associated shortness of breath.  No swelling of her lower extremities, no history of CHF, no recent traveling, no new medications.  Denies previous history of DVT/PE.  No recent fever or illness.  No productive cough. ? ?Home Medications ?Prior to Admission medications   ?Medication Sig Start Date End Date Taking? Authorizing Provider  ?ALPRAZolam (XANAX) 0.5 MG tablet Take 0.5 mg by mouth 2 (two) times daily as needed for anxiety. 09/11/13   [provider]  ?amLODipine (NORVASC) 2.5 MG tablet Take 2.5 mg by mouth daily. 05/23/21   [provider]  ?carboxymethylcellulose (REFRESH PLUS) 0.5 % SOLN Place 1 drop into both eyes 3 (three) times daily as needed (dry eyes).    [provider]  ?chlorpheniramine (CHLOR-TRIMETON) 4 MG tablet Take 4 mg by mouth 2 (two) times daily as needed for rhinitis. ?Patient not taking: Reported on 09/09/2021    [provider]  ?COVID-19 mRNA bivalent vaccine, Pfizer, (PFIZER COVID-19 VAC BIVALENT) injection Inject into the muscle. ?Patient not taking: Reported on 09/09/2021 07/01/21   Carlyle Basques, MD  ?COVID-19 mRNA vaccine, Pfizer, 30 MCG/0.3ML injection Inject into the muscle. ?Patient not taking: Reported on 09/09/2021 11/03/20    Carlyle Basques, MD  ?diclofenac Sodium (VOLTAREN) 1 % GEL Apply 1 application topically at bedtime.    [provider]  ?gabapentin (NEURONTIN) 300 MG capsule Take 300 mg by mouth at bedtime.     [provider]  ?hydrochlorothiazide (MICROZIDE) 12.5 MG capsule Take 1 capsule (12.5 mg total) by mouth daily. ?Patient taking differently: Take 12.5 mg by mouth daily as needed (fluid). 06/27/21 09/25/21  Troy Sine, MD  ?HYDROcodone-acetaminophen (NORCO) 5-325 MG tablet Take 1-2 tablets by mouth every 6 (six) hours as needed for moderate pain. Do not drive while taking ?Patient not taking: Reported on 09/09/2021 08/15/21   Owens Shark, NP  ?influenza vaccine adjuvanted (FLUAD) 0.5 ML injection Inject into the muscle. ?Patient not taking: Reported on 09/09/2021 05/05/21   Carlyle Basques, MD  ?isosorbide mononitrate (IMDUR) 30 MG 24 hr tablet TAKE 1 TABLET BY MOUTH  DAILY 10/06/21   Troy Sine, MD  ?lidocaine (LIDODERM) 5 % Place 1 patch onto the skin daily as needed (pain). ?Patient not taking: Reported on 09/09/2021 06/17/20   [provider]  ?lisinopril (ZESTRIL) 10 MG tablet TAKE 1 TABLET BY MOUTH  DAILY 05/27/21   Troy Sine, MD  ?lisinopril (ZESTRIL) 20 MG tablet 1 tablet Orally Once a day    [provider]  ?Magnesium 250 MG TABS Take 250 mg by mouth at bedtime.     [provider]  ?metFORMIN (GLUCOPHAGE) 500 MG tablet Take 500 mg by mouth at bedtime. 12/18/20   [provider]  ?methocarbamol (ROBAXIN) 500  MG tablet Take by mouth. ?Patient not taking: Reported on 09/09/2021 07/19/21   [provider]  ?metoprolol tartrate (LOPRESSOR) 25 MG tablet Take 25 mg by mouth at bedtime.     [provider]  ?Multiple Vitamins-Minerals (CENTRUM SILVER PO) Take 1 tablet by mouth daily.    [provider]  ?naproxen sodium (ALEVE) 220 MG tablet Take 220 mg by mouth.    [provider]  ?nitroGLYCERIN (NITROSTAT) 0.4 MG SL  tablet Place 1 tablet (0.4 mg total) under the tongue every 5 (five) minutes as needed for chest pain. 06/27/21   Troy Sine, MD  ?ondansetron (ZOFRAN) 8 MG tablet Take 1 tablet (8 mg total) by mouth every 8 (eight) hours as needed for nausea or vomiting. ?Patient not taking: Reported on 09/09/2021 06/10/19   Owens Shark, NP  ?pembrolizumab [facility]) 100 MG/4ML SOLN as directed Intravenous    [provider]  ?polyethylene glycol (MIRALAX / GLYCOLAX) packet Take 17 g by mouth every morning.     [provider]  ?rOPINIRole (REQUIP) 1 MG tablet Take 1 mg by mouth 3 (three) times daily.    [provider]  ?rosuvastatin (CRESTOR) 40 MG tablet Take 40 mg by mouth at bedtime.     [provider]  ?traZODone (DESYREL) 100 MG tablet Take 100 mg by mouth at bedtime.     [provider]  ?Vitamin D, Ergocalciferol, (DRISDOL) 50000 UNITS CAPS capsule Take 50,000 Units by mouth every Sunday.    [provider]  ?   ? ?Allergies    ?Codeine   ? ?Review of Systems   ?Review of Systems  ?Constitutional:  Negative for fatigue and fever.  ?Respiratory:  Positive for shortness of breath. Negative for cough.   ?Cardiovascular:  Positive for chest pain. Negative for palpitations and leg swelling.  ?Gastrointestinal:  Negative for abdominal pain, diarrhea and vomiting.  ?Skin:  Negative for rash.  ?Neurological:  Negative for headaches.  ? ?Physical Exam ?Updated Vital Signs ?BP 129/62   Pulse (!) 59   Temp (!) 97.4 ?F (36.3 ?C)   Resp 16   Ht 5\' 5"  (1.651 m)   Wt 83.9 kg   SpO2 96%   BMI 30.79 kg/m?  ?Physical Exam ?Vitals and nursing note reviewed.  ?Constitutional:   ?   General: She is not in acute distress. ?   Appearance: Normal appearance. She is not diaphoretic.  ?HENT:  ?   Head: Normocephalic.  ?   Mouth/Throat:  ?   Mouth: Mucous membranes are moist.  ?Cardiovascular:  ?   Rate and Rhythm: Normal rate.  ?Pulmonary:  ?   Effort: Pulmonary effort is normal.  No tachypnea or respiratory distress.  ?   Breath sounds: No decreased breath sounds.  ?Abdominal:  ?   Palpations: Abdomen is soft.  ?   Tenderness: There is no abdominal tenderness.  ?Musculoskeletal:  ?   Right lower leg: No edema.  ?   Left lower leg: No edema.  ?Skin: ?   General: Skin is warm.  ?Neurological:  ?   Mental Status: She is alert and oriented to person, place, and time. Mental status is at baseline.  ?Psychiatric:     ?   Mood and Affect: Mood normal.  ? ? ?ED Results / Procedures / Treatments   ?Labs ?(all labs ordered are listed, but only abnormal results are displayed) ?Labs Reviewed  ?BASIC METABOLIC PANEL - Abnormal; Notable for the following components:  ?  Result Value  ? Glucose, Bld 115 (*)   ? BUN 29 (*)   ? GFR, Estimated 56 (*)   ? All other components within normal limits  ?CBC  ?TROPONIN I (HIGH SENSITIVITY)  ?TROPONIN I (HIGH SENSITIVITY)  ? ? ?EKG ?EKG Interpretation ? ?Date/Time:  Thursday October 20 2021 12:17:32 EDT ?Ventricular Rate:  63 ?PR Interval:  208 ?QRS Duration: 70 ?QT Interval:  372 ?QTC Calculation: 380 ?R Axis:   -28 ?Text Interpretation: Normal sinus rhythm Cannot rule out Anterior infarct , age undetermined Abnormal ECG When compared with ECG of 02-Feb-2011 07:22, QT has shortened Confirmed by Lavenia Atlas [phone]) on 10/20/2021 12:57:19 PM ? ?Radiology ?DG Chest 2 View ? ?Result Date: 10/20/2021 ?CLINICAL DATA:  Shortness of breath, chest pain EXAM: CHEST - 2 VIEW COMPARISON:  Previous studies including the examination of 11/26/2020 FINDINGS: Cardiac size is within normal limits. There are no signs of pulmonary edema or focal pulmonary consolidation. There is 2.4 cm noncalcified nodule in the left lower lung fields with interval increase in size. There is no pleural effusion or pneumothorax. Tip of right IJ chest port is seen in the superior vena cava. There is surgical fusion in the lower cervical spine. IMPRESSION: There is 2.4 cm noncalcified nodule in the left  lower lung fields suggesting neoplastic process. There is interval increase in size of this nodule. There are no signs of pulmonary edema or focal pulmonary consolidation. Electronically Signed   By: Royston Cowper  Rat

## 2021-10-20 NOTE — Telephone Encounter (Signed)
Patient reports having angina yesterday, where she needed 2 ntg. Today she had another episode of angina and needed 1 ntg. She stated it took about 5-6 minutes before each ntg worked. While on phone, BP 162/78, P 67. I recommended for her to go to the ED to be evaluated. She stated her husband is getting chemo treatments a [facility], so she will go there. ?

## 2021-10-20 NOTE — Discharge Instructions (Signed)
Please follow-up with your cardiology office for the episode of chest pain today. ?

## 2021-10-21 ENCOUNTER — Inpatient Hospital Stay: Payer: Medicare Other | Admitting: Nurse Practitioner

## 2021-10-21 ENCOUNTER — Encounter: Payer: Self-pay | Admitting: *Deleted

## 2021-10-21 ENCOUNTER — Inpatient Hospital Stay: Payer: Medicare Other

## 2021-10-21 ENCOUNTER — Encounter: Payer: Self-pay | Admitting: Nurse Practitioner

## 2021-10-21 VITALS — BP 129/72 | HR 72 | Temp 97.8°F | Resp 20 | Ht 65.0 in | Wt 185.6 lb

## 2021-10-21 DIAGNOSIS — C3492 Malignant neoplasm of unspecified part of left bronchus or lung: Secondary | ICD-10-CM | POA: Diagnosis not present

## 2021-10-21 DIAGNOSIS — Z5112 Encounter for antineoplastic immunotherapy: Secondary | ICD-10-CM | POA: Diagnosis not present

## 2021-10-21 DIAGNOSIS — C9000 Multiple myeloma not having achieved remission: Secondary | ICD-10-CM

## 2021-10-21 LAB — CBC WITH DIFFERENTIAL (CANCER CENTER ONLY)
Abs Immature Granulocytes: 0.02 10*3/uL (ref 0.00–0.07)
Basophils Absolute: 0 10*3/uL (ref 0.0–0.1)
Basophils Relative: 0 %
Eosinophils Absolute: 0 10*3/uL (ref 0.0–0.5)
Eosinophils Relative: 0 %
HCT: 37.2 % (ref 36.0–46.0)
Hemoglobin: 11.9 g/dL — ABNORMAL LOW (ref 12.0–15.0)
Immature Granulocytes: 0 %
Lymphocytes Relative: 8 %
Lymphs Abs: 0.6 10*3/uL — ABNORMAL LOW (ref 0.7–4.0)
MCH: 29.2 pg (ref 26.0–34.0)
MCHC: 32 g/dL (ref 30.0–36.0)
MCV: 91.4 fL (ref 80.0–100.0)
Monocytes Absolute: 0.6 10*3/uL (ref 0.1–1.0)
Monocytes Relative: 9 %
Neutro Abs: 5.9 10*3/uL (ref 1.7–7.7)
Neutrophils Relative %: 83 %
Platelet Count: 171 10*3/uL (ref 150–400)
RBC: 4.07 MIL/uL (ref 3.87–5.11)
RDW: 15 % (ref 11.5–15.5)
WBC Count: 7.2 10*3/uL (ref 4.0–10.5)
nRBC: 0 % (ref 0.0–0.2)

## 2021-10-21 LAB — CMP (CANCER CENTER ONLY)
ALT: 17 U/L (ref 0–44)
AST: 16 U/L (ref 15–41)
Albumin: 4.6 g/dL (ref 3.5–5.0)
Alkaline Phosphatase: 70 U/L (ref 38–126)
Anion gap: 7 (ref 5–15)
BUN: 36 mg/dL — ABNORMAL HIGH (ref 8–23)
CO2: 25 mmol/L (ref 22–32)
Calcium: 10.1 mg/dL (ref 8.9–10.3)
Chloride: 104 mmol/L (ref 98–111)
Creatinine: 1.13 mg/dL — ABNORMAL HIGH (ref 0.44–1.00)
GFR, Estimated: 48 mL/min — ABNORMAL LOW (ref >60)
Glucose, Bld: 99 mg/dL (ref 70–99)
Potassium: 4.9 mmol/L (ref 3.5–5.1)
Sodium: 136 mmol/L (ref 135–145)
Total Bilirubin: 0.4 mg/dL (ref 0.3–1.2)
Total Protein: 7.4 g/dL (ref 6.5–8.1)

## 2021-10-21 MED ORDER — SODIUM CHLORIDE 0.9 % IV SOLN: Freq: Once | INTRAVENOUS | Status: AC

## 2021-10-21 MED ORDER — SODIUM CHLORIDE 0.9% FLUSH
10.0000 mL | INTRAVENOUS | Status: DC | PRN
  Administered 2021-10-21: 10 mL

## 2021-10-21 MED ORDER — SODIUM CHLORIDE 0.9 % IV SOLN
200.0000 mg | Freq: Once | INTRAVENOUS | Status: AC
  Administered 2021-10-21: 200 mg via INTRAVENOUS
  Filled 2021-10-21: qty 8

## 2021-10-21 MED ORDER — HEPARIN SOD (PORK) LOCK FLUSH 100 UNIT/ML IV SOLN
500.0000 [IU] | Freq: Once | INTRAVENOUS | Status: AC | PRN
  Administered 2021-10-21: 500 [IU]

## 2021-10-21 NOTE — Patient Instructions (Signed)
[facility]   ?Discharge Instructions: ?Thank you for choosing [facility] to provide your oncology and hematology care.  ? ?If you have a lab appointment with the [facility], please go directly to the [facility] and check in at the registration area. ?  ?Wear comfortable clothing and clothing appropriate for easy access to any Portacath or PICC line.  ? ?We strive to give you quality time with your provider. You may need to reschedule your appointment if you arrive late (15 or more minutes).  Arriving late affects you and other patients whose appointments are after yours.  Also, if you miss three or more appointments without notifying the office, you may be dismissed from the clinic at the provider?s discretion.    ?  ?For prescription refill requests, have your pharmacy contact our office and allow 72 hours for refills to be completed.   ? ?Today you received the following chemotherapy and/or immunotherapy agents Pembrolizumab (KEYTRUDA).    ?  ?To help prevent nausea and vomiting after your treatment, we encourage you to take your nausea medication as directed. ? ?BELOW ARE SYMPTOMS THAT SHOULD BE REPORTED IMMEDIATELY: ?*FEVER GREATER THAN 100.4 F (38 ?C) OR HIGHER ?*CHILLS OR SWEATING ?*NAUSEA AND VOMITING THAT IS NOT CONTROLLED WITH YOUR NAUSEA MEDICATION ?*UNUSUAL SHORTNESS OF BREATH ?*UNUSUAL BRUISING OR BLEEDING ?*URINARY PROBLEMS (pain or burning when urinating, or frequent urination) ?*BOWEL PROBLEMS (unusual diarrhea, constipation, pain near the anus) ?TENDERNESS IN MOUTH AND THROAT WITH OR WITHOUT PRESENCE OF ULCERS (sore throat, sores in mouth, or a toothache) ?UNUSUAL RASH, SWELLING OR PAIN  ?UNUSUAL VAGINAL DISCHARGE OR ITCHING  ? ?Items with * indicate a potential emergency and should be followed up as soon as possible or go to the Emergency Department if any problems should occur. ? ?Please show the CHEMOTHERAPY ALERT CARD or IMMUNOTHERAPY ALERT CARD  at check-in to the Emergency Department and triage nurse. ? ?Should you have questions after your visit or need to cancel or reschedule your appointment, please contact [facility]  Dept: [phone]  and follow the prompts.  Office hours are 8:00 a.m. to 4:30 p.m. Monday - Friday. Please note that voicemails left after 4:00 p.m. may not be returned until the following business day.  We are closed weekends and major holidays. You have access to a nurse at all times for urgent questions. Please call the main number to the clinic Dept: [phone] and follow the prompts. ? ? ?For any non-urgent questions, you may also contact your provider using MyChart. We now offer e-Visits for anyone 93 and older to request care online for non-urgent symptoms. For details visit [email or web address]. ?  ?Also download the MyChart app! Go to the app store, search "MyChart", open the app, select [facility], and log in with your MyChart username and password. ? ?Due to Covid, a mask is required upon entering the hospital/clinic. If you do not have a mask, one will be given to you upon arrival. For doctor visits, patients may have 1 support person aged 45 or older with them. For treatment visits, patients cannot have anyone with them due to current Covid guidelines and our immunocompromised population.  ? ?Pembrolizumab injection ?What is this medication? ?PEMBROLIZUMAB (pem broe liz ue mab) is a monoclonal antibody. It is used to treat certain types of cancer. ?This medicine may be used for other purposes; ask your health care provider or pharmacist if you have questions. ?COMMON BRAND NAME(S):  Keytruda ?What should I tell my care team before I take this medication? ?They need to know if you have any of these conditions: ?autoimmune diseases like Crohn's disease, ulcerative colitis, or lupus ?have had or planning to have an allogeneic stem cell transplant (uses someone else's stem cells) ?history of  organ transplant ?history of chest radiation ?nervous system problems like myasthenia gravis or Guillain-Barre syndrome ?an unusual or allergic reaction to pembrolizumab, other medicines, foods, dyes, or preservatives ?pregnant or trying to get pregnant ?breast-feeding ?How should I use this medication? ?This medicine is for infusion into a vein. It is given by a health care professional in a hospital or clinic setting. ?A special MedGuide will be given to you before each treatment. Be sure to read this information carefully each time. ?Talk to your pediatrician regarding the use of this medicine in children. While this drug may be prescribed for children as young as 6 months for selected conditions, precautions do apply. ?Overdosage: If you think you have taken too much of this medicine contact a poison control center or emergency room at once. ?NOTE: This medicine is only for you. Do not share this medicine with others. ?What if I miss a dose? ?It is important not to miss your dose. Call your doctor or health care professional if you are unable to keep an appointment. ?What may interact with this medication? ?Interactions have not been studied. ?This list may not describe all possible interactions. Give your health care provider a list of all the medicines, herbs, non-prescription drugs, or dietary supplements you use. Also tell them if you smoke, drink alcohol, or use illegal drugs. Some items may interact with your medicine. ?What should I watch for while using this medication? ?Your condition will be monitored carefully while you are receiving this medicine. ?You may need blood work done while you are taking this medicine. ?Do not become pregnant while taking this medicine or for 4 months after stopping it. Women should inform their doctor if they wish to become pregnant or think they might be pregnant. There is a potential for serious side effects to an unborn child. Talk to your health care professional or  pharmacist for more information. Do not breast-feed an infant while taking this medicine or for 4 months after the last dose. ?What side effects may I notice from receiving this medication? ?Side effects that you should report to your doctor or health care professional as soon as possible: ?allergic reactions like skin rash, itching or hives, swelling of the face, lips, or tongue ?bloody or black, tarry ?breathing problems ?changes in vision ?chest pain ?chills ?confusion ?constipation ?cough ?diarrhea ?dizziness or feeling faint or lightheaded ?fast or irregular heartbeat ?fever ?flushing ?joint pain ?low blood counts - this medicine may decrease the number of white blood cells, red blood cells and platelets. You may be at increased risk for infections and bleeding. ?muscle pain ?muscle weakness ?pain, tingling, numbness in the hands or feet ?persistent headache ?redness, blistering, peeling or loosening of the skin, including inside the mouth ?signs and symptoms of high blood sugar such as dizziness; dry mouth; dry skin; fruity breath; nausea; stomach pain; increased hunger or thirst; increased urination ?signs and symptoms of kidney injury like trouble passing urine or change in the amount of urine ?signs and symptoms of liver injury like dark urine, light-colored stools, loss of appetite, nausea, right upper belly pain, yellowing of the eyes or skin ?sweating ?swollen lymph nodes ?weight loss ?Side effects that usually do not  require medical attention (report to your doctor or health care professional if they continue or are bothersome): ?decreased appetite ?hair loss ?tiredness ?This list may not describe all possible side effects. Call your doctor for medical advice about side effects. You may report side effects to FDA at 1-800-FDA-1088. ?Where should I keep my medication? ?This drug is given in a hospital or clinic and will not be stored at home. ?NOTE: This sheet is a summary. It may not cover all possible  information. If you have questions about this medicine, talk to your doctor, pharmacist, or health care provider. ?? 2022 Elsevier/Gold Standard (2021-04-05 00:00:00) ? ?

## 2021-10-21 NOTE — Progress Notes (Unsigned)
Patient seen by [name] NP today  Vitals are within treatment parameters.  Labs reviewed by [name] NP and are within treatment parameters.  Per physician team, patient is ready for treatment and there are NO modifications to the treatment plan.     

## 2021-10-21 NOTE — Progress Notes (Signed)
Patient presents for treatment. RN assessment completed along with the following: ? ?Labs/vitals reviewed - Yes, and within treatment parameters.   ?Weight within 10% of previous measurement - Yes ?Informed consent completed and reflects current therapy/intent - Yes, on date 06/06/2019             ?Provider progress note reviewed - Today's provider note is not yet available. I reviewed the most recent oncology provider progress note in chart dated 09/30/2021. ?Treatment/Antibody/Supportive plan reviewed - Yes, and there are no adjustments needed for today's treatment. ?S&H and other orders reviewed - Yes, and there are no additional orders identified. ?Previous treatment date reviewed - Yes, and the appropriate amount of time has elapsed between treatments. ?Clinic Hand Off Received from - Yes from [place], RN ? ?Patient to proceed with treatment.  ? ?

## 2021-10-21 NOTE — Progress Notes (Signed)
?[facility] ?OFFICE PROGRESS NOTE ? ? ?Diagnosis: Non-small cell lung cancer ? ?INTERVAL HISTORY:  ? ?Ms. Ortner returns as scheduled.  She completed another treatment with Pembrolizumab 09/30/2021.  She was seen in the emergency department last night due to complaints of increased frequency of chest pain.  CT chest showed a lobulated 2.7 x 2.1 cm noncalcified nodule in the left lower lobe, increased in size.  No other discrete lung nodules were seen.  No evidence of pulmonary embolism. ? ?She continues to have intermittent chest pain.  She takes nitroglycerin periodically.  Generalized joint pains are better since beginning medrol as prescribed by rheumatology.  She especially notes improvement in shoulder pain.  No rash or diarrhea. ? ?Objective: ? ?Vital signs in last 24 hours: ? ?Blood pressure 129/72, pulse 72, temperature 97.8 ?F (36.6 ?C), temperature source Oral, resp. rate 20, height _0  (1.651 m), weight 185 lb 9.6 oz (84.2 kg), SpO2 96 %. ?  ? ?HEENT: No thrush or ulcers. ?Resp: Lungs clear bilaterally. ?Cardio: Regular rate and rhythm. ?GI: Abdomen soft and nontender.  No hepatomegaly. ?Vascular: No leg edema. ?Skin: No rash. ?Port-A-Cath without erythema. ? ? ?Lab Results: ? ?Lab Results  ?Component Value Date  ? WBC 5.3 10/20/2021  ? HGB 12.3 10/20/2021  ? HCT 38.0 10/20/2021  ? MCV 90.3 10/20/2021  ? PLT 197 10/20/2021  ? NEUTROABS 3.3 09/30/2021  ? ? ?Imaging: ? ?DG Chest 2 View ? ?Result Date: 10/20/2021 ?CLINICAL DATA:  Shortness of breath, chest pain EXAM: CHEST - 2 VIEW COMPARISON:  Previous studies including the examination of 11/26/2020 FINDINGS: Cardiac size is within normal limits. There are no signs of pulmonary edema or focal pulmonary consolidation. There is 2.4 cm noncalcified nodule in the left lower lung fields with interval increase in size. There is no pleural effusion or pneumothorax. Tip of right IJ chest port is seen in the superior vena cava. There is surgical  fusion in the lower cervical spine. IMPRESSION: There is 2.4 cm noncalcified nodule in the left lower lung fields suggesting neoplastic process. There is interval increase in size of this nodule. There are no signs of pulmonary edema or focal pulmonary consolidation. Electronically Signed   By: Elmer Picker M.D.   On: 10/20/2021 12:50  ? ?CT Angio Chest PE W/Cm &/Or Wo Cm ? ?Result Date: 10/20/2021 ?CLINICAL DATA:  Chest pain history of lung carcinoma EXAM: CT ANGIOGRAPHY CHEST WITH CONTRAST TECHNIQUE: Multidetector CT imaging of the chest was performed using the standard protocol during bolus administration of intravenous contrast. Multiplanar CT image reconstructions and MIPs were obtained to evaluate the vascular anatomy. RADIATION DOSE REDUCTION: This exam was performed according to the departmental dose-optimization program which includes automated exposure control, adjustment of the mA and/or kV according to patient size and/or use of iterative reconstruction technique. CONTRAST:  49m OMNIPAQUE IOHEXOL 350 MG/ML SOLN COMPARISON:  12/15/2020 FINDINGS: Cardiovascular: There is homogeneous enhancement in thoracic aorta. There are no intraluminal filling defects in the pulmonary artery branches. Main pulmonary artery measures 3.7 cm in diameter suggesting pulmonary arterial hypertension. Mediastinum/Nodes: No new significant lymphadenopathy seen. There is inhomogeneous attenuation in the thyroid. Lungs/Pleura: There is a lobulated 2.7 x 2.1 cm noncalcified nodule in the left lower lobe with interval increase in size. No other discrete lung nodules are seen. There is no focal pulmonary consolidation. There is no pleural effusion or pneumothorax. Upper Abdomen: No focal abnormality is seen in the visualized portions of liver. Adrenals are not enlarged.  Musculoskeletal: Unremarkable. Review of the MIP images confirms the above findings. IMPRESSION: There is no evidence of pulmonary artery embolism. There is  ectasia of main pulmonary artery suggesting pulmonary arterial hypertension. There is no evidence of thoracic aortic dissection. There is 2.7 x 2.1 cm lobulated noncalcified nodule in the left lower lobe suggesting malignant neoplasm. There is interval increase in size since 12/15/2020. There is no significant lymphadenopathy or other signs of metastatic disease in the chest and upper abdomen. Other findings as described in the body of the report. Electronically Signed   By: Elmer Picker M.D.   On: 10/20/2021 16:38   ? ?Medications: I have reviewed the patient's current medications. ? ?Assessment/Plan: ?Non-small cell lung cancer ?MRI lumbar spine 04/29/2019- enlarging marrow lesions involving the L1 vertebral body, upper left sacrum and right iliac bone ?MRI pelvis 04/29/2019- 3.5 cm left iliac bone lesion appears slightly larger; other similar appearing lesions present within the left superior pubic ramus, left superior abdomen acetabulum and upper left sacrum ?Kappa free light chains with mild elevation 05/12/2019  ?CTs 05/12/2019- left lower lobe pulmonary mass 3.3 x 3.2 cm; lytic process left iliac bone; spinal lesions; 1.1 cm low-density left kidney lesion; right thyroid enlargement with heterogeneous appearance with potential for multiple discrete lesions ?Biopsy left lower lobe lung mass 05/26/2019-poorly differentiated carcinoma; positive for cytokeratin 5/6, p63 and TTF-1, no EGFR, BRAF, ALK, ERBB2,ROS, or NTRK alteration ?Cycle 1 carboplatin/Alimta/pembrolizumab 06/06/2019 ?Cycle 2 carboplatin/Alimta/pembrolizumab 06/27/2019 ?Cycle 3 carboplatin/Alimta/pembrolizumab 07/18/2019 ?Cycle 4 carboplatin/Alimta/pembrolizumab 08/07/2019 ?CTs 08/27/2019-significant decrease in size of lobulated mass left lower lobe.  Unchanged appearance of subtle bone lesions. ?Cycle 5 Alimta/pembrolizumab 08/28/2019 ?Cycle 6 Alimta/pembrolizumab 09/18/2019 ?Cycle 7 Alimta/pembrolizumab 10/09/2019 ?Cycle 8 Alimta/pembrolizumab  10/30/2019 ?Cycle 9 Alimta/pembrolizumab 11/20/2019 ?CTs 12/09/2019-no evidence of disease progression, left lower lobe nodule slightly decreased in size, stable L1, left sacral, and left pubic ramus metastases ?Cycle 10 Alimta/pembrolizumab 12/11/2019 ?Cycle 11 pembrolizumab alone 01/02/2020 (Alimta held due to edema, tenderness, erythema at the lower legs) ?Cycle 12 pembrolizumab 01/23/2020 ?Cycle 13 pembrolizumab 02/12/2020 ?Cycle 14 pembrolizumab 03/04/2020 ?CTs 03/18/2020-stable left lower lobe lesion, mild sclerosis at the superior endplate of L1 that was previously hypermetabolic, stable small left upper sacral lucent lesion, previous left superior pubic ramus lesion is occult on the CT, CT head negative for malignancy ?Cycle 15 pembrolizumab 03/25/2020 ?Cycle 16 pembrolizumab 04/21/2020 ?Cycle 17 pembrolizumab 05/17/2020 ?05/19/2020 bone scan-no definite abnormalities to suggest osseous metastases.  Areas of concern on prior PET-CT involving left iliac bone and L1 vertebral body showed no abnormalities on the current study ?Cycle 18 Pembrolizumab 06/10/2020 ?Cycle 19 Pembrolizumab 06/30/2020 ?CTs 07/16/2020-stable left lower lobe nodule, stable faint superior L1 vertebral lesion, no evidence of disease progression ?Cycle 20 pembrolizumab 07/21/2020 ?Cycle 21 Pembrolizumab 08/11/2020 ?Cycle 22 pembrolizumab 09/01/2020 ?Cycle 23 Pembrolizumab 09/22/2020 ?Cycle 24 pembrolizumab 10/13/2020 ?Cycle 25 Pembrolizumab 11/03/2020 ?Cycle 26 pembrolizumab 11/26/2020 ?CTs 12/15/2020- stable left lower lobe mass, stable sclerotic lesion at L2, no evidence of disease progression ?Cycle 27 pembrolizumab 12/17/2020 ?PET scan 01/03/2021-1.8 x 1.3 cm left lower lobe nodule similar in size to CT of 12/15/2020 and measures smaller than previous PET/CT from 2020.  Nodule is markedly hypermetabolic.  No evidence for hypermetabolic hilar or mediastinal lymphadenopathy.  Several tiny foci of hypermetabolism identified in bony anatomy raising concern for  skeletal metastases.  Comparison of the PET to CT 07/16/2020-lobular left lower lobe pulmonary nodule measured 2.4 x 1.5 cm on the prior study, current study it measured 1.8 x 1.3 cm.  Lesion in the anterior  left ac

## 2021-11-01 ENCOUNTER — Ambulatory Visit (INDEPENDENT_AMBULATORY_CARE_PROVIDER_SITE_OTHER): Payer: Medicare Other | Admitting: Cardiovascular Disease

## 2021-11-01 ENCOUNTER — Encounter: Payer: Self-pay | Admitting: Cardiovascular Disease

## 2021-11-01 VITALS — BP 132/64 | HR 64 | Ht 65.0 in | Wt 185.4 lb

## 2021-11-01 DIAGNOSIS — I25118 Atherosclerotic heart disease of native coronary artery with other forms of angina pectoris: Secondary | ICD-10-CM

## 2021-11-01 DIAGNOSIS — I10 Essential (primary) hypertension: Secondary | ICD-10-CM | POA: Diagnosis not present

## 2021-11-01 DIAGNOSIS — E118 Type 2 diabetes mellitus with unspecified complications: Secondary | ICD-10-CM

## 2021-11-01 DIAGNOSIS — I5189 Other ill-defined heart diseases: Secondary | ICD-10-CM

## 2021-11-01 DIAGNOSIS — C3492 Malignant neoplasm of unspecified part of left bronchus or lung: Secondary | ICD-10-CM | POA: Diagnosis not present

## 2021-11-01 DIAGNOSIS — E785 Hyperlipidemia, unspecified: Secondary | ICD-10-CM | POA: Diagnosis not present

## 2021-11-01 MED ORDER — METOPROLOL TARTRATE 25 MG PO TABS: ORAL_TABLET | ORAL | 6 refills | Status: DC

## 2021-11-01 MED ORDER — ASPIRIN EC 81 MG PO TBEC: 81.0000 mg | DELAYED_RELEASE_TABLET | Freq: Every day | ORAL | 3 refills | Status: DC

## 2021-11-01 MED ORDER — ISOSORBIDE MONONITRATE ER 30 MG PO TB24: 60.0000 mg | ORAL_TABLET | Freq: Every day | ORAL | 6 refills | Status: DC

## 2021-11-01 NOTE — Progress Notes (Signed)
Patient ID: [name], female   DOB: Jun 16, 1935, 87 y.o.   MRN: [id number] ? ? ? ? ? ?HPI: [name] is a 87 y.o. female who presents to the office for a 5 month cardiology evaluation with a chief complaint of recurrent chest pain. ? ?[name] has known CAD and in March 2012 underwent stenting of a 90% eccentric RCA stenosis with a 3.0x15 mm integrity bare-metal stent. She developed recurrent chest pain in July 2012 and repeat catheterization revealed widely patent stent. She had a 60% postural diagonal 1 stenosis and medical therapy was recommended. She had mild luminal irregularities of the LAD. ? ?In 2012 an echo Doppler study showed normal systolic function with mild aortic sclerosis mild MR and mild TR. She has a history of hyperlipidemia, restless legs, as well as documented insulin resistance. ? ?She developed a herniated disc in her neck and underwent surgery by Dr. Sherley Bounds on November 30 1 2014. She tolerated surgery well from a cardiovascular standpoint. ? ?She has mild nerve damage from an episode of shingles which is improved with Neurontin. She does have restless legs which she takes Sinemet 25/100, one quarter of a tablet 2 times per day. She does have hyperlipidemia. She does have hypertension. She denies presyncope or syncope.   ? ?When I saw her in 2016 she had noticed development of rare chest pain when she walks fast or walks up a steep incline.  She underwent a nuclear perfusion study in October 2016 which showed normal perfusion and function without scar or ischemia.   ? ?She underwent eye surgery in  August 2017 involving her lacrimal duct.  She did have significant nosebleed following this. ? ?She underwent a follow-up echo Doppler study in December 2017 which showed normal ejection fraction at 55-60% with grade 1 diastolic dysfunction.  There was aortic valve sclerosis without stenosis.  She tells me that her metformin was discontinued this past year by Dr.  [name].  Laboratory had shown a hemoglobin A1c of 6.1.  In May 2018 LDL cholesterol was 63 on rosuvastatin.  I last saw her in February 2019.  At that time her blood pressure was stable she has continued to be on lisinopril 5 mg daily, metoprolol 37.5 mg twice a day for hypertension.  She has peripheral neuropathy on gabapentin 300 mg daily.  She has restless legs, and also takes carbidopa levodopa 25/100 mg one quarter of a tablet twice a day with improvement in symptomatology.  She denied any recurrent anginal symptoms.  There is mild shortness of breath with activity.  She is active.  I encouraged at least 150 minutes/week of exercise. ? ?I saw her in February 2020.  At that time felt fairly well from a cardiac standpoint.  She continues to experience some mild shortness of breath with activity without significant change.  She was walking her dog at least 20 minutes a day basis.  She has been documented to have diastolic dysfunction on echocardiography.  She was under  increased stress with her husband's illness who in addition to his bronchiectasis also has developed leukemia. ? ?I saw her in February 2021.  Since her prior evaluation with me she unfortunately  was diagnosed with non-small cell lung CA, stage IV with metastatic bone lesion.  She is followed by Dr. Benay Spice.  She completed 4 cycles of carboplatin/Alimta/pembrolizumab on August 07, 2019.  She had a remote history of smoking and had smoked 1 pack/day for 25 years and quit smoking  in 1990, 31 years ago.  She denied any chest pain, PND orthopnea.  During that evaluation, on exam she had an early peaking systolic murmur felt consistent with probable mild aortic stenosis.  She had not had an echo Doppler study since 2017 and I commended a follow-up echo Doppler assessment. ? ?She underwent a follow-up echo Doppler study in September 26, 2019.  This showed an EF of 60 to 65%.  There was moderate asymmetric left ventricular hypertrophy of the basal  septum without S.A.M. or LVOT obstruction.  There was grade 1 diastolic dysfunction.  Estimated RV systolic pressure was 93.2 mm.  Aortic valve was tricuspid and there was no evidence for stenosis. ? ?I saw her in November 2021.  She continues to be followed by oncology for her metastatic lung cancer and  completed 18 cycles of pembrolizumab.  She continues to have back and hip pain.  She also developed a COVID-19 infection on March 30, 2020 and received monoclonal antibody therapy on April 06, 2020.  She denies any chest pain, PND orthopnea.  She denies any presyncope or syncope.   ? ?I  saw her on Dec 21, 2020.  At that time a recent CT had shown some mets to her bone.  She she continues to follow with Dr. Benay Spice and had another cycle of Keytruda on November 26, 2020.  At times she admits to rare angina and has taken sublingual nitroglycerin with benefit rarely.  She denies any prolonged episodes of chest pain.  She continues to be on lisinopril 10 mg, metoprolol tartrate 25 mg twice a day and was taking baby aspirin 3 times per week.  She is on rosuvastatin 40 mg daily for hyperlipidemia.   ? ?I last saw her on June 27, 2021.  Since her prior evaluation she had progressive difficulty with low back pain.  She also has undergone additional treatment with Keytruda followed by Dr. Benay Spice for her metastatic non-small cell lung CA, stage IV.  She tells me recent PET scan did show improvement in her lesions which are now smaller.  Due to progressive lower back discomfort, she was tentatively scheduled to undergo laminectomy and foraminectomy at L4-5 and L5-S1 by Dr. Sherley Bounds scheduled for July 18, 2021.  She denied any chest pain.  She had run out of her sublingual nitroglycerin and was in need for a renewal.  She was on amlodipine 2.5 mg, isosorbide 30 mg daily, lisinopril 10 mg, metoprolol tartrate 25 mg at bedtime, in addition to rosuvastatin 40 mg for significant hyperlipidemia.  She is no longer on  aspirin therapy.  She has noted some very mild lower extremity edema to her pretibial are. She has not had any anginal symptomatology.  She is diabetic on metformin.  She presented for follow-up evaluation and was given preoperative clearance for her upcoming surgery. ? ?Presently, she continues to be followed for her non-small cell lung CA by Dr. Ammie Dalton and has received 41 cycles of pembrolizumab.  Recently, she has noticed more shortness of breath with activity and walking.  She has developed several episodes of chest tightness which occurred at night which have been nitrate responsive.  She states this discomfort commences in her chest and goes up to her neck and is very similar to her prior anginal symptomatology.  Due to her similar characteristics to her prior angina she feels she needs another cardiac catheterization.  She presents to the office today for evaluation. ? ? ?Past Medical History:  ?Diagnosis Date  ?  Aneurysm ([place])   ? Right eye a non DES stent was placed so that she would not required long term dual antiplatelet therapy.  ? Anginal pain ([place])   ? Anxiety   ? not currently taking any meds  ? Arthritis   ? CAD (coronary artery disease)   ? Diabetes mellitus without complication ([place])   ? H/O hiatal hernia   ? Heart murmur   ? History of stress test 03/31/2012  ? The post stress myocardial perfusion images show a normal pattern of perfusion in all region. The post left ventricles is normal in size. There is no scintigraphic of inductible myocardial ischemia. The post EF is 77  ? Hx of echocardiogram 11/29/2010  ? Ef 67% Normal size chambes, Aortic valve sclerosis without stenosis, No other significant valvular abnormalities, No percardial effusion.  ? Hyperlipemia   ? Hypertension   ? Insulin resistance   ? Lung cancer ([place])   ? Multiple thyroid nodules   ? Neuromuscular disorder ([place])   ? nerve pain after shingles  ? Osteoporosis   ? Restless legs   ? Shingles   ? ? ?Past Surgical History:   ?Procedure Laterality Date  ? ABDOMINAL HYSTERECTOMY  1970  ? ANGIOPLASTY    ? Stenting of a 90% eccentric right coronary artery stenosis and had a 3.0x15 mm Integrity bare-metal stent inserted  ? ANTERIOR CERVICA

## 2021-11-01 NOTE — Patient Instructions (Addendum)
Medication Instructions:  ? ?Aspirin 81 mg daily  ? ? Change Metoprolol tartrate 37.5 mg  in the morning and 25 mg in the evening  ? ? Increase Isosorbide mono (Imdur) to 60 mg daily  ? ?*If you need a refill on your cardiac medications before your next appointment, please call your pharmacy* ? ? ?Lab Work: ?Cbc,bmp  today  ?If you have labs (blood work) drawn today and your tests are completely normal, you will receive your results only by: ?MyChart Message (if you have MyChart) OR ?A paper copy in the mail ?If you have any lab test that is abnormal or we need to change your treatment, we will call you to review the results. ? ? ?Testing/Procedures: ? Will be schedule at [address] ?Your physician has requested that you have an echocardiogram. Echocardiography is a painless test that uses sound waves to create images of your heart. It provides your doctor with information about the size and shape of your heart and how well your heart?s chambers and valves are working. This procedure takes approximately one hour. There are no restrictions for this procedure. ? ?And ? Schedule cardiac cath November 15, 2021 at [address]  ?Your physician has requested that you have a cardiac catheterization. Cardiac catheterization is used to diagnose and/or treat various heart conditions. Doctors may recommend this procedure for a number of different reasons. The most common reason is to evaluate chest pain. Chest pain can be a symptom of coronary artery disease (CAD), and cardiac catheterization can show whether plaque is narrowing or blocking your heart?s arteries. This procedure is also used to evaluate the valves, as well as measure the blood flow and oxygen levels in different parts of your heart. For further information please visit [email or web address]. Please follow instruction sheet, as given.  ? ?Follow-Up: ?At [facility], you and your health needs are our priority.  As part of our  continuing mission to provide you with exceptional heart care, we have created designated Provider Care Teams.  These Care Teams include your primary Cardiologist (physician) and Advanced Practice Providers (APPs -  Physician Assistants and Nurse Practitioners) who all work together to provide you with the care you need, when you need it. ? ?  ? ?Your next appointment:   ?5 to 6 week(s) ? ?The format for your next appointment:   ?In Person ? ?Provider:   ?Shelva Majestic, MD  ? ? ?Other Instructions  ? ?[facility] ?[address] ?[place] [address] ?Dept: [phone] ?Loc: [phone] ? ?[name]  11/01/2021 ? ?You are scheduled for a Cardiac Catheterization on Tuesday, April 18 with Dr. Shelva Majestic. ? ?1. Please arrive at the Main Entrance A at [facility]: [place], [place] [address] at 5:30 AM (This time is two hours before your procedure to ensure your preparation). Free valet parking service is available.  ? ?Special note: Every effort is made to have your procedure done on time. Please understand that emergencies sometimes delay scheduled procedures. ? ?2. Diet: Do not eat solid foods after midnight.  You may have clear liquids until 5 AM upon the day of the procedure. ? ?3. Labs: You will need to have blood drawn today. You do not need to be fasting. ? ?4. Medication instructions in preparation for your procedure: ? ? Contrast Allergy: no ? ? ? ?Do Not  taking, lisinopril  or  Hydrochlorothiazide Tuesday, April 18, ? ? ?On the morning of your procedure, take Aspirin 81mg   and any morning medicines NOT listed above.  You may use sips of water. ? ?5. Plan to go home the same day, you will only stay overnight if medically necessary. ?6. You MUST have a responsible adult to drive you home. ?7. An adult MUST be with you the first 24 hours after you arrive home. ?8. Bring a current list of  your medications, and the last time and date medication taken. ?9. Bring ID and current insurance cards. ?10.Please wear clothes that are easy to get on and off and wear slip-on shoes. ? ?Thank you for allowing [place] to care for you! ?  --  Invasive Cardiovascular services  ?

## 2021-11-01 NOTE — H&P (View-Only) (Signed)
Patient ID: [name], female   DOB: 06/21/1935, 87 y.o.   MRN: [id number] ? ? ? ? ? ?HPI: [name] is a 87 y.o. female who presents to the office for a 5 month cardiology evaluation with a chief complaint of recurrent chest pain. ? ?[name] has known CAD and in March 2012 underwent stenting of a 90% eccentric RCA stenosis with a 3.0x15 mm integrity bare-metal stent. She developed recurrent chest pain in July 2012 and repeat catheterization revealed widely patent stent. She had a 60% postural diagonal 1 stenosis and medical therapy was recommended. She had mild luminal irregularities of the LAD. ? ?In 2012 an echo Doppler study showed normal systolic function with mild aortic sclerosis mild MR and mild TR. She has a history of hyperlipidemia, restless legs, as well as documented insulin resistance. ? ?She developed a herniated disc in her neck and underwent surgery by Dr. Sherley Bounds on November 30 1 2014. She tolerated surgery well from a cardiovascular standpoint. ? ?She has mild nerve damage from an episode of shingles which is improved with Neurontin. She does have restless legs which she takes Sinemet 25/100, one quarter of a tablet 2 times per day. She does have hyperlipidemia. She does have hypertension. She denies presyncope or syncope.   ? ?When I saw her in 2016 she had noticed development of rare chest pain when she walks fast or walks up a steep incline.  She underwent a nuclear perfusion study in October 2016 which showed normal perfusion and function without scar or ischemia.   ? ?She underwent eye surgery in  August 2017 involving her lacrimal duct.  She did have significant nosebleed following this. ? ?She underwent a follow-up echo Doppler study in December 2017 which showed normal ejection fraction at 55-60% with grade 1 diastolic dysfunction.  There was aortic valve sclerosis without stenosis.  She tells me that her metformin was discontinued this past year by Dr.  [name].  Laboratory had shown a hemoglobin A1c of 6.1.  In May 2018 LDL cholesterol was 63 on rosuvastatin.  I last saw her in February 2019.  At that time her blood pressure was stable she has continued to be on lisinopril 5 mg daily, metoprolol 37.5 mg twice a day for hypertension.  She has peripheral neuropathy on gabapentin 300 mg daily.  She has restless legs, and also takes carbidopa levodopa 25/100 mg one quarter of a tablet twice a day with improvement in symptomatology.  She denied any recurrent anginal symptoms.  There is mild shortness of breath with activity.  She is active.  I encouraged at least 150 minutes/week of exercise. ? ?I saw her in February 2020.  At that time felt fairly well from a cardiac standpoint.  She continues to experience some mild shortness of breath with activity without significant change.  She was walking her dog at least 20 minutes a day basis.  She has been documented to have diastolic dysfunction on echocardiography.  She was under  increased stress with her husband's illness who in addition to his bronchiectasis also has developed leukemia. ? ?I saw her in February 2021.  Since her prior evaluation with me she unfortunately  was diagnosed with non-small cell lung CA, stage IV with metastatic bone lesion.  She is followed by Dr. Benay Spice.  She completed 4 cycles of carboplatin/Alimta/pembrolizumab on August 07, 2019.  She had a remote history of smoking and had smoked 1 pack/day for 25 years and quit smoking  in 1990, 31 years ago.  She denied any chest pain, PND orthopnea.  During that evaluation, on exam she had an early peaking systolic murmur felt consistent with probable mild aortic stenosis.  She had not had an echo Doppler study since 2017 and I commended a follow-up echo Doppler assessment. ? ?She underwent a follow-up echo Doppler study in September 26, 2019.  This showed an EF of 60 to 65%.  There was moderate asymmetric left ventricular hypertrophy of the basal  septum without S.A.M. or LVOT obstruction.  There was grade 1 diastolic dysfunction.  Estimated RV systolic pressure was 10.2 mm.  Aortic valve was tricuspid and there was no evidence for stenosis. ? ?I saw her in November 2021.  She continues to be followed by oncology for her metastatic lung cancer and  completed 18 cycles of pembrolizumab.  She continues to have back and hip pain.  She also developed a COVID-19 infection on March 30, 2020 and received monoclonal antibody therapy on April 06, 2020.  She denies any chest pain, PND orthopnea.  She denies any presyncope or syncope.   ? ?I  saw her on Dec 21, 2020.  At that time a recent CT had shown some mets to her bone.  She she continues to follow with Dr. Benay Spice and had another cycle of Keytruda on November 26, 2020.  At times she admits to rare angina and has taken sublingual nitroglycerin with benefit rarely.  She denies any prolonged episodes of chest pain.  She continues to be on lisinopril 10 mg, metoprolol tartrate 25 mg twice a day and was taking baby aspirin 3 times per week.  She is on rosuvastatin 40 mg daily for hyperlipidemia.   ? ?I last saw her on June 27, 2021.  Since her prior evaluation she had progressive difficulty with low back pain.  She also has undergone additional treatment with Keytruda followed by Dr. Benay Spice for her metastatic non-small cell lung CA, stage IV.  She tells me recent PET scan did show improvement in her lesions which are now smaller.  Due to progressive lower back discomfort, she was tentatively scheduled to undergo laminectomy and foraminectomy at L4-5 and L5-S1 by Dr. Sherley Bounds scheduled for July 18, 2021.  She denied any chest pain.  She had run out of her sublingual nitroglycerin and was in need for a renewal.  She was on amlodipine 2.5 mg, isosorbide 30 mg daily, lisinopril 10 mg, metoprolol tartrate 25 mg at bedtime, in addition to rosuvastatin 40 mg for significant hyperlipidemia.  She is no longer on  aspirin therapy.  She has noted some very mild lower extremity edema to her pretibial are. She has not had any anginal symptomatology.  She is diabetic on metformin.  She presented for follow-up evaluation and was given preoperative clearance for her upcoming surgery. ? ?Presently, she continues to be followed for her non-small cell lung CA by Dr. Ammie Dalton and has received 41 cycles of pembrolizumab.  Recently, she has noticed more shortness of breath with activity and walking.  She has developed several episodes of chest tightness which occurred at night which have been nitrate responsive.  She states this discomfort commences in her chest and goes up to her neck and is very similar to her prior anginal symptomatology.  Due to her similar characteristics to her prior angina she feels she needs another cardiac catheterization.  She presents to the office today for evaluation. ? ? ?Past Medical History:  ?Diagnosis Date  ?  Aneurysm ([place])   ? Right eye a non DES stent was placed so that she would not required long term dual antiplatelet therapy.  ? Anginal pain ([place])   ? Anxiety   ? not currently taking any meds  ? Arthritis   ? CAD (coronary artery disease)   ? Diabetes mellitus without complication ([place])   ? H/O hiatal hernia   ? Heart murmur   ? History of stress test 03/31/2012  ? The post stress myocardial perfusion images show a normal pattern of perfusion in all region. The post left ventricles is normal in size. There is no scintigraphic of inductible myocardial ischemia. The post EF is 69  ? Hx of echocardiogram 11/29/2010  ? Ef 67% Normal size chambes, Aortic valve sclerosis without stenosis, No other significant valvular abnormalities, No percardial effusion.  ? Hyperlipemia   ? Hypertension   ? Insulin resistance   ? Lung cancer ([place])   ? Multiple thyroid nodules   ? Neuromuscular disorder ([place])   ? nerve pain after shingles  ? Osteoporosis   ? Restless legs   ? Shingles   ? ? ?Past Surgical History:   ?Procedure Laterality Date  ? ABDOMINAL HYSTERECTOMY  1970  ? ANGIOPLASTY    ? Stenting of a 90% eccentric right coronary artery stenosis and had a 3.0x15 mm Integrity bare-metal stent inserted  ? ANTERIOR CERVICA

## 2021-11-02 ENCOUNTER — Encounter: Payer: Self-pay | Admitting: Oncology

## 2021-11-02 LAB — CBC
Hematocrit: 35.8 % (ref 34.0–46.6)
Hemoglobin: 12.5 g/dL (ref 11.1–15.9)
MCH: 30.3 pg (ref 26.6–33.0)
MCHC: 34.9 g/dL (ref 31.5–35.7)
MCV: 87 fL (ref 79–97)
Platelets: 126 10*3/uL — ABNORMAL LOW (ref 150–450)
RBC: 4.12 x10E6/uL (ref 3.77–5.28)
RDW: 14.6 % (ref 11.7–15.4)
WBC: 5.7 10*3/uL (ref 3.4–10.8)

## 2021-11-02 LAB — BASIC METABOLIC PANEL
BUN/Creatinine Ratio: 30 — ABNORMAL HIGH (ref 12–28)
BUN: 30 mg/dL — ABNORMAL HIGH (ref 8–27)
CO2: 23 mmol/L (ref 20–29)
Calcium: 10.1 mg/dL (ref 8.7–10.3)
Chloride: 100 mmol/L (ref 96–106)
Creatinine, Ser: 1 mg/dL (ref 0.57–1.00)
Glucose: 118 mg/dL — ABNORMAL HIGH (ref 70–99)
Potassium: 4.4 mmol/L (ref 3.5–5.2)
Sodium: 138 mmol/L (ref 134–144)
eGFR: 56 mL/min/{1.73_m2} — ABNORMAL LOW (ref >59)

## 2021-11-05 ENCOUNTER — Encounter: Payer: Self-pay | Admitting: Cardiovascular Disease

## 2021-11-06 ENCOUNTER — Other Ambulatory Visit: Payer: Self-pay | Admitting: Oncology

## 2021-11-07 ENCOUNTER — Encounter: Payer: Self-pay | Admitting: Oncology

## 2021-11-09 ENCOUNTER — Encounter: Payer: Self-pay | Admitting: Oncology

## 2021-11-09 ENCOUNTER — Ambulatory Visit (HOSPITAL_COMMUNITY): Payer: Medicare Other | Attending: Cardiovascular Disease

## 2021-11-09 DIAGNOSIS — I10 Essential (primary) hypertension: Secondary | ICD-10-CM | POA: Insufficient documentation

## 2021-11-09 DIAGNOSIS — I25118 Atherosclerotic heart disease of native coronary artery with other forms of angina pectoris: Secondary | ICD-10-CM | POA: Diagnosis present

## 2021-11-09 LAB — ECHOCARDIOGRAM COMPLETE
Area-P 1/2: 3.12 cm2
S' Lateral: 2.8 cm

## 2021-11-11 ENCOUNTER — Inpatient Hospital Stay: Payer: Medicare Other

## 2021-11-11 ENCOUNTER — Inpatient Hospital Stay: Payer: Medicare Other | Attending: Oncology

## 2021-11-11 ENCOUNTER — Inpatient Hospital Stay (HOSPITAL_BASED_OUTPATIENT_CLINIC_OR_DEPARTMENT_OTHER): Payer: Medicare Other | Admitting: Oncology

## 2021-11-11 VITALS — BP 136/59 | HR 62 | Temp 98.1°F | Resp 18 | Ht 65.0 in | Wt 184.6 lb

## 2021-11-11 DIAGNOSIS — Z5112 Encounter for antineoplastic immunotherapy: Secondary | ICD-10-CM | POA: Insufficient documentation

## 2021-11-11 DIAGNOSIS — C3492 Malignant neoplasm of unspecified part of left bronchus or lung: Secondary | ICD-10-CM

## 2021-11-11 DIAGNOSIS — C7951 Secondary malignant neoplasm of bone: Secondary | ICD-10-CM | POA: Diagnosis present

## 2021-11-11 DIAGNOSIS — C3432 Malignant neoplasm of lower lobe, left bronchus or lung: Secondary | ICD-10-CM | POA: Diagnosis present

## 2021-11-11 DIAGNOSIS — Z79899 Other long term (current) drug therapy: Secondary | ICD-10-CM | POA: Diagnosis not present

## 2021-11-11 DIAGNOSIS — Z95828 Presence of other vascular implants and grafts: Secondary | ICD-10-CM

## 2021-11-11 LAB — CMP (CANCER CENTER ONLY)
ALT: 21 U/L (ref 0–44)
AST: 19 U/L (ref 15–41)
Albumin: 4.2 g/dL (ref 3.5–5.0)
Alkaline Phosphatase: 51 U/L (ref 38–126)
Anion gap: 8 (ref 5–15)
BUN: 35 mg/dL — ABNORMAL HIGH (ref 8–23)
CO2: 24 mmol/L (ref 22–32)
Calcium: 9.9 mg/dL (ref 8.9–10.3)
Chloride: 106 mmol/L (ref 98–111)
Creatinine: 1.19 mg/dL — ABNORMAL HIGH (ref 0.44–1.00)
GFR, Estimated: 45 mL/min — ABNORMAL LOW (ref >60)
Glucose, Bld: 93 mg/dL (ref 70–99)
Potassium: 4.3 mmol/L (ref 3.5–5.1)
Sodium: 138 mmol/L (ref 135–145)
Total Bilirubin: 0.4 mg/dL (ref 0.3–1.2)
Total Protein: 6.9 g/dL (ref 6.5–8.1)

## 2021-11-11 LAB — TSH: TSH: 0.841 u[IU]/mL (ref 0.350–4.500)

## 2021-11-11 MED ORDER — HEPARIN SOD (PORK) LOCK FLUSH 100 UNIT/ML IV SOLN
500.0000 [IU] | Freq: Once | INTRAVENOUS | Status: AC | PRN
  Administered 2021-11-11: 500 [IU]

## 2021-11-11 MED ORDER — SODIUM CHLORIDE 0.9% FLUSH
10.0000 mL | Freq: Once | INTRAVENOUS | Status: AC
  Administered 2021-11-11: 10 mL

## 2021-11-11 MED ORDER — SODIUM CHLORIDE 0.9 % IV SOLN
200.0000 mg | Freq: Once | INTRAVENOUS | Status: AC
  Administered 2021-11-11: 200 mg via INTRAVENOUS
  Filled 2021-11-11: qty 8

## 2021-11-11 MED ORDER — SODIUM CHLORIDE 0.9 % IV SOLN: Freq: Once | INTRAVENOUS | Status: AC

## 2021-11-11 MED ORDER — SODIUM CHLORIDE 0.9% FLUSH
10.0000 mL | INTRAVENOUS | Status: DC | PRN
  Administered 2021-11-11: 10 mL

## 2021-11-11 NOTE — Patient Instructions (Signed)

## 2021-11-11 NOTE — Patient Instructions (Signed)
Pembrolizumab injection ?What is this medication? ?PEMBROLIZUMAB (pem broe liz ue mab) is a monoclonal antibody. It is used to treat certain types of cancer. ?This medicine may be used for other purposes; ask your health care provider or pharmacist if you have questions. ?COMMON BRAND NAME(S): Keytruda ?What should I tell my care team before I take this medication? ?They need to know if you have any of these conditions: ?autoimmune diseases like Crohn's disease, ulcerative colitis, or lupus ?have had or planning to have an allogeneic stem cell transplant (uses someone else's stem cells) ?history of organ transplant ?history of chest radiation ?nervous system problems like myasthenia gravis or Guillain-Barre syndrome ?an unusual or allergic reaction to pembrolizumab, other medicines, foods, dyes, or preservatives ?pregnant or trying to get pregnant ?breast-feeding ?How should I use this medication? ?This medicine is for infusion into a vein. It is given by a health care professional in a hospital or clinic setting. ?A special MedGuide will be given to you before each treatment. Be sure to read this information carefully each time. ?Talk to your pediatrician regarding the use of this medicine in children. While this drug may be prescribed for children as young as 6 months for selected conditions, precautions do apply. ?Overdosage: If you think you have taken too much of this medicine contact a poison control center or emergency room at once. ?NOTE: This medicine is only for you. Do not share this medicine with others. ?What if I miss a dose? ?It is important not to miss your dose. Call your doctor or health care professional if you are unable to keep an appointment. ?What may interact with this medication? ?Interactions have not been studied. ?This list may not describe all possible interactions. Give your health care provider a list of all the medicines, herbs, non-prescription drugs, or dietary supplements you use.  Also tell them if you smoke, drink alcohol, or use illegal drugs. Some items may interact with your medicine. ?What should I watch for while using this medication? ?Your condition will be monitored carefully while you are receiving this medicine. ?You may need blood work done while you are taking this medicine. ?Do not become pregnant while taking this medicine or for 4 months after stopping it. Women should inform their doctor if they wish to become pregnant or think they might be pregnant. There is a potential for serious side effects to an unborn child. Talk to your health care professional or pharmacist for more information. Do not breast-feed an infant while taking this medicine or for 4 months after the last dose. ?What side effects may I notice from receiving this medication? ?Side effects that you should report to your doctor or health care professional as soon as possible: ?allergic reactions like skin rash, itching or hives, swelling of the face, lips, or tongue ?bloody or black, tarry ?breathing problems ?changes in vision ?chest pain ?chills ?confusion ?constipation ?cough ?diarrhea ?dizziness or feeling faint or lightheaded ?fast or irregular heartbeat ?fever ?flushing ?joint pain ?low blood counts - this medicine may decrease the number of white blood cells, red blood cells and platelets. You may be at increased risk for infections and bleeding. ?muscle pain ?muscle weakness ?pain, tingling, numbness in the hands or feet ?persistent headache ?redness, blistering, peeling or loosening of the skin, including inside the mouth ?signs and symptoms of high blood sugar such as dizziness; dry mouth; dry skin; fruity breath; nausea; stomach pain; increased hunger or thirst; increased urination ?signs and symptoms of kidney injury like trouble  passing urine or change in the amount of urine ?signs and symptoms of liver injury like dark urine, light-colored stools, loss of appetite, nausea, right upper belly pain,  yellowing of the eyes or skin ?sweating ?swollen lymph nodes ?weight loss ?Side effects that usually do not require medical attention (report to your doctor or health care professional if they continue or are bothersome): ?decreased appetite ?hair loss ?tiredness ?This list may not describe all possible side effects. Call your doctor for medical advice about side effects. You may report side effects to FDA at 1-800-FDA-1088. ?Where should I keep my medication? ?This drug is given in a hospital or clinic and will not be stored at home. ?NOTE: This sheet is a summary. It may not cover all possible information. If you have questions about this medicine, talk to your doctor, pharmacist, or health care provider. ?? 2023 Elsevier/Gold Standard (2021-06-17 00:00:00) ? ?

## 2021-11-11 NOTE — Progress Notes (Signed)
?[place] ?OFFICE PROGRESS NOTE ? ? ?Diagnosis: Non-small cell lung cancer ? ?INTERVAL HISTORY:  ? ?[name] completed another treatment with pembrolizumab 10/21/2021.  No rash or diarrhea.  She reports feeling weaker since the isosorbide and metoprolol doses were increased.  She is scheduled for a cardiac catheterization procedure next week.  Episodes of angina have improved with the increased medical regimen. ?Her pain remains much improved while on Medrol.  The Medrol dose has been decreased to 4 mg daily. ? ?Objective: ? ?Vital signs in last 24 hours: ? ?Blood pressure (!) 136/59, pulse 62, temperature 98.1 ?F (36.7 ?C), temperature source Oral, resp. rate 18, height 5\' 5"  (1.651 m), weight 184 lb 9.6 oz (83.7 kg), SpO2 96 %. ?  ? ?Resp: Lungs clear bilaterally ?Cardio: Regular rate and rhythm ?GI: No hepatosplenomegaly ?Vascular: No leg edema ?  ? ?Portacath/PICC-without erythema ? ?Lab Results: ? ?Lab Results  ?Component Value Date  ? WBC 5.7 11/01/2021  ? HGB 12.5 11/01/2021  ? HCT 35.8 11/01/2021  ? MCV 87 11/01/2021  ? PLT 126 (L) 11/01/2021  ? NEUTROABS 5.9 10/21/2021  ? ? ?CMP  ?Lab Results  ?Component Value Date  ? NA 138 11/01/2021  ? K 4.4 11/01/2021  ? CL 100 11/01/2021  ? CO2 23 11/01/2021  ? GLUCOSE 118 (H) 11/01/2021  ? BUN 30 (H) 11/01/2021  ? CREATININE 1.00 11/01/2021  ? CALCIUM 10.1 11/01/2021  ? PROT 7.4 10/21/2021  ? ALBUMIN 4.6 10/21/2021  ? AST 16 10/21/2021  ? ALT 17 10/21/2021  ? ALKPHOS 70 10/21/2021  ? BILITOT 0.4 10/21/2021  ? GFRNONAA 48 (L) 10/21/2021  ? GFRAA >60 04/21/2020  ? ? ? ? ?Imaging: ? ?ECHOCARDIOGRAM COMPLETE ? ?Result Date: 11/09/2021 ?   ECHOCARDIOGRAM REPORT   [name] Date of Exam: 11/09/2021 Medical Rec #:  [id number]             Height:       65.0 in Accession #:    [id number]            Weight:       185.4 lb Date of Birth:  1935/06/20             BSA:          1.915 m? Patient Age:    87 years              BP:            119/62 mmHg Patient Gender: F                     HR:           54 bpm. Exam Location:  [address] Procedure: 2D Echo, 3D Echo, Cardiac Doppler, Color Doppler and Strain Analysis Indications:    R07.9 Chest Pain  History:        Patient has prior history of Echocardiogram examinations, most                 recent 09/26/2019. CAD, Lung cancer, Signs/Symptoms:Murmur; Risk                 Factors:Diabetes and HLD.  Sonographer:    Marygrace Drought RCS Referring Phys: [place]  1. Left ventricular ejection fraction, by estimation, is 60 to 65%. Left ventricular ejection fraction by 3D volume is 66 %. The left ventricle has normal function. The left ventricle has no  regional wall motion abnormalities. Left ventricular diastolic  parameters are consistent with Grade I diastolic dysfunction (impaired relaxation). The average left ventricular global longitudinal strain is -18.5 %. The global longitudinal strain is normal.  2. Right ventricular systolic function is normal. The right ventricular size is normal. There is normal pulmonary artery systolic pressure. The estimated right ventricular systolic pressure is 45.0 mmHg.  3. The mitral valve is normal in structure. No evidence of mitral valve regurgitation. No evidence of mitral stenosis.  4. The aortic valve is tricuspid. There is mild calcification of the aortic valve. Aortic valve regurgitation is trivial. Aortic valve sclerosis is present, with no evidence of aortic valve stenosis.  5. The inferior vena cava is normal in size with greater than 50% respiratory variability, suggesting right atrial pressure of 3 mmHg. Comparison(s): No significant change from prior study. Prior images reviewed side by side. FINDINGS  Left Ventricle: Left ventricular ejection fraction, by estimation, is 60 to 65%. Left ventricular ejection fraction by 3D volume is 66 %. The left ventricle has normal function. The left ventricle has no regional wall motion  abnormalities. The average left ventricular global longitudinal strain is -18.5 %. The global longitudinal strain is normal. The left ventricular internal cavity size was normal in size. There is no left ventricular hypertrophy. Left ventricular diastolic parameters are consistent  with Grade I diastolic dysfunction (impaired relaxation). Indeterminate filling pressures. Right Ventricle: The right ventricular size is normal. No increase in right ventricular wall thickness. Right ventricular systolic function is normal. There is normal pulmonary artery systolic pressure. The tricuspid regurgitant velocity is 2.20 m/s, and  with an assumed right atrial pressure of 3 mmHg, the estimated right ventricular systolic pressure is 38.8 mmHg. Left Atrium: Left atrial size was normal in size. Right Atrium: Right atrial size was normal in size. Pericardium: There is no evidence of pericardial effusion. Mitral Valve: The mitral valve is normal in structure. No evidence of mitral valve regurgitation. No evidence of mitral valve stenosis. Tricuspid Valve: The tricuspid valve is normal in structure. Tricuspid valve regurgitation is trivial. No evidence of tricuspid stenosis. Aortic Valve: The aortic valve is tricuspid. There is mild calcification of the aortic valve. Aortic valve regurgitation is trivial. Aortic valve sclerosis is present, with no evidence of aortic valve stenosis. Pulmonic Valve: The pulmonic valve was normal in structure. Pulmonic valve regurgitation is trivial. No evidence of pulmonic stenosis. Aorta: The aortic root is normal in size and structure. Venous: The inferior vena cava is normal in size with greater than 50% respiratory variability, suggesting right atrial pressure of 3 mmHg. IAS/Shunts: No atrial level shunt detected by color flow Doppler.  LEFT VENTRICLE PLAX 2D LVIDd:         3.80 cm         Diastology LVIDs:         2.80 cm         LV e' medial:    6.20 cm/s LV PW:         1.00 cm         LV E/e'  medial:  11.2 LV IVS:        1.10 cm         LV e' lateral:   5.44 cm/s LVOT diam:     1.60 cm         LV E/e' lateral: 12.7 LV SV:         45 LV SV Index:   23  2D LVOT Area:     2.01 cm?        Longitudinal                                Strain                                2D Strain GLS  -17.8 %                                (A2C):                                2D Strain GLS  -17.4 %                                (A3C):                                2D Strain GLS  -20.3 %                                (A4C):                                2D Strain GLS  -18.5 %                                Avg:                                 3D Volume EF                                LV 3D EF:    Left                                             ventricul                                             ar                                             ejection                                             fraction  by 3D                                             volume is                                             66 %.                                 3D Volume EF:                                3D EF:        66 %                                LV EDV:       74 ml                                LV ESV:       25 ml                                LV SV:        49 ml RIGHT VENTRICLE RV Basal diam:  2.80 cm RV S prime:     11.60 cm/s TAPSE (M-mode): 1.9 cm RVSP:           22.4 mmHg LEFT ATRIUM             Index        RIGHT ATRIUM           Index LA diam:        2.70 cm 1.41 cm/m?   RA Pressure: 3.00 mmHg LA Vol (A2C):   32.4 ml 16.92 ml/m?  RA Area:     11.20 cm? LA Vol (A4C):   24.5 ml 12.79 ml/m?  RA Volume:   22.00 ml  11.49 ml/m? LA Biplane Vol: 28.1 ml 14.67 ml/m?  AORTIC VALVE LVOT Vmax:   113.00 cm/s LVOT Vmean:  75.200 cm/s LVOT VTI:    0.222 m  AORTA Ao Root diam: 3.30 cm Ao Asc diam:  3.30 cm MITRAL VALVE               TRICUSPID VALVE MV Area (PHT):             TR  Peak grad:   19.4 mmHg MV Decel Time:             TR Vmax:        220.00 cm/s MV E velocity: 69.20 cm/s  Estimated RAP:  3.00 mmHg MV A velocity: 95.40 cm/s  RVSP:           22.4 mmHg MV E/A ratio:  0.73

## 2021-11-11 NOTE — Progress Notes (Signed)
Patient presents for treatment. RN assessment completed along with the following: ? ?Labs/vitals reviewed - Yes, and Per Dr. Benay Spice, ok to treat with CBC from 11/01/21.     ?Weight within 10% of previous measurement - Yes ?Informed consent completed and reflects current therapy/intent - Yes, on date 06/06/19             ?Provider progress note reviewed - Yes, today's provider note was reviewed. ?Treatment/Antibody/Supportive plan reviewed - Yes, and there are no adjustments needed for today's treatment. ?S&H and other orders reviewed - Yes, and there are no additional orders identified. ?Previous treatment date reviewed - Yes, and the appropriate amount of time has elapsed between treatments. ? ?Patient to proceed with treatment.   ?

## 2021-11-14 ENCOUNTER — Encounter: Payer: Self-pay | Admitting: Oncology

## 2021-11-14 ENCOUNTER — Telehealth: Payer: Self-pay | Admitting: *Deleted

## 2021-11-14 NOTE — Telephone Encounter (Addendum)
Cardiac Catheterization scheduled at [facility] for: Tuesday November 15, 2021 7:30 AM ?Arrival time and place: [facility] at: 5:30 AM ? ? ?No solid food after midnight prior to cath, clear liquids until 5 AM day of procedure. ? ?Medication instructions: ?-Hold: ? Lisinopril/HCTZ-day before and day of procedure-per protocol ? Metformin-day of procedure and 48 hours post procedure ? NSAIDs-day before and day of procedure ?-Except hold medications usual morning medications can be taken with sips of water including aspirin 81 mg. ? ?Confirmed patient has responsible adult to drive home post procedure and be with patient first 24 hours after arriving home. ? ?Patient reports no new symptoms concerning for COVID-19/no exposure to COVID-19 in the past 10 days. ? ?Reviewed procedure instructions with patient, discussed hydrating well today.  ?

## 2021-11-15 ENCOUNTER — Ambulatory Visit (HOSPITAL_COMMUNITY)
Admission: RE | Admit: 2021-11-15 | Discharge: 2021-11-15 | Disposition: A | Discharge status: Home / Self Care | Payer: Medicare Other | Attending: Cardiovascular Disease | Admitting: Cardiovascular Disease

## 2021-11-15 ENCOUNTER — Encounter (HOSPITAL_COMMUNITY)
Admission: RE | Disposition: A | Discharge status: Home / Self Care | Payer: Medicare Other | Source: Home / Self Care | Attending: Cardiovascular Disease

## 2021-11-15 ENCOUNTER — Other Ambulatory Visit: Payer: Self-pay

## 2021-11-15 ENCOUNTER — Encounter (HOSPITAL_COMMUNITY): Payer: Self-pay | Admitting: Cardiovascular Disease

## 2021-11-15 DIAGNOSIS — Z87891 Personal history of nicotine dependence: Secondary | ICD-10-CM | POA: Diagnosis not present

## 2021-11-15 DIAGNOSIS — C7951 Secondary malignant neoplasm of bone: Secondary | ICD-10-CM | POA: Diagnosis not present

## 2021-11-15 DIAGNOSIS — Z955 Presence of coronary angioplasty implant and graft: Secondary | ICD-10-CM | POA: Insufficient documentation

## 2021-11-15 DIAGNOSIS — I10 Essential (primary) hypertension: Secondary | ICD-10-CM

## 2021-11-15 DIAGNOSIS — E785 Hyperlipidemia, unspecified: Secondary | ICD-10-CM | POA: Insufficient documentation

## 2021-11-15 DIAGNOSIS — I119 Hypertensive heart disease without heart failure: Secondary | ICD-10-CM | POA: Diagnosis not present

## 2021-11-15 DIAGNOSIS — I25118 Atherosclerotic heart disease of native coronary artery with other forms of angina pectoris: Secondary | ICD-10-CM | POA: Diagnosis present

## 2021-11-15 DIAGNOSIS — C3492 Malignant neoplasm of unspecified part of left bronchus or lung: Secondary | ICD-10-CM | POA: Insufficient documentation

## 2021-11-15 DIAGNOSIS — E119 Type 2 diabetes mellitus without complications: Secondary | ICD-10-CM | POA: Diagnosis not present

## 2021-11-15 HISTORY — PX: LEFT HEART CATH AND CORONARY ANGIOGRAPHY: CATH118249

## 2021-11-15 LAB — GLUCOSE, CAPILLARY: Glucose-Capillary: 116 mg/dL — ABNORMAL HIGH (ref 70–99)

## 2021-11-15 SURGERY — LEFT HEART CATH AND CORONARY ANGIOGRAPHY
Anesthesia: LOCAL

## 2021-11-15 MED ORDER — SODIUM CHLORIDE 0.9 % WEIGHT BASED INFUSION: 1.0000 mL/kg/h | INTRAVENOUS | Status: DC

## 2021-11-15 MED ORDER — SODIUM CHLORIDE 0.9 % WEIGHT BASED INFUSION: 3.0000 mL/kg/h | INTRAVENOUS | Status: AC

## 2021-11-15 MED ORDER — VERAPAMIL HCL 2.5 MG/ML IV SOLN
INTRAVENOUS | Status: AC
  Filled 2021-11-15: qty 2

## 2021-11-15 MED ORDER — HEPARIN SODIUM (PORCINE) 1000 UNIT/ML IJ SOLN
INTRAMUSCULAR | Status: DC | PRN
  Administered 2021-11-15: 4200 [IU] via INTRAVENOUS

## 2021-11-15 MED ORDER — LABETALOL HCL 5 MG/ML IV SOLN: 10.0000 mg | INTRAVENOUS | Status: DC | PRN

## 2021-11-15 MED ORDER — SODIUM CHLORIDE 0.9 % IV SOLN: INTRAVENOUS | Status: DC

## 2021-11-15 MED ORDER — ONDANSETRON HCL 4 MG/2ML IJ SOLN: 4.0000 mg | Freq: Four times a day (QID) | INTRAMUSCULAR | Status: DC | PRN

## 2021-11-15 MED ORDER — SODIUM CHLORIDE 0.9% FLUSH: 3.0000 mL | INTRAVENOUS | Status: DC | PRN

## 2021-11-15 MED ORDER — HEPARIN SODIUM (PORCINE) 1000 UNIT/ML IJ SOLN
INTRAMUSCULAR | Status: AC
  Filled 2021-11-15: qty 10

## 2021-11-15 MED ORDER — LIDOCAINE HCL (PF) 1 % IJ SOLN
INTRAMUSCULAR | Status: AC
  Filled 2021-11-15: qty 30

## 2021-11-15 MED ORDER — VERAPAMIL HCL 2.5 MG/ML IV SOLN: INTRAVENOUS | Status: DC | PRN

## 2021-11-15 MED ORDER — ACETAMINOPHEN 325 MG PO TABS: 650.0000 mg | ORAL_TABLET | ORAL | Status: DC | PRN

## 2021-11-15 MED ORDER — ASPIRIN 81 MG PO CHEW: 81.0000 mg | CHEWABLE_TABLET | Freq: Every day | ORAL | Status: DC

## 2021-11-15 MED ORDER — MIDAZOLAM HCL 2 MG/2ML IJ SOLN
INTRAMUSCULAR | Status: DC | PRN
  Administered 2021-11-15: 1 mg via INTRAVENOUS

## 2021-11-15 MED ORDER — HEPARIN (PORCINE) IN NACL 1000-0.9 UT/500ML-% IV SOLN
INTRAVENOUS | Status: DC | PRN
  Administered 2021-11-15 (×2): 500 mL

## 2021-11-15 MED ORDER — IOHEXOL 350 MG/ML SOLN
INTRAVENOUS | Status: DC | PRN
  Administered 2021-11-15: 35 mL

## 2021-11-15 MED ORDER — FENTANYL CITRATE (PF) 100 MCG/2ML IJ SOLN
INTRAMUSCULAR | Status: AC
  Filled 2021-11-15: qty 2

## 2021-11-15 MED ORDER — HYDRALAZINE HCL 20 MG/ML IJ SOLN: 10.0000 mg | INTRAMUSCULAR | Status: DC | PRN

## 2021-11-15 MED ORDER — FENTANYL CITRATE (PF) 100 MCG/2ML IJ SOLN
INTRAMUSCULAR | Status: DC | PRN
Start: 2021-11-15 — End: 2021-11-15
  Administered 2021-11-15: 25 ug via INTRAVENOUS

## 2021-11-15 MED ORDER — MIDAZOLAM HCL 2 MG/2ML IJ SOLN
INTRAMUSCULAR | Status: AC
  Filled 2021-11-15: qty 2

## 2021-11-15 MED ORDER — SODIUM CHLORIDE 0.9% FLUSH: 3.0000 mL | Freq: Two times a day (BID) | INTRAVENOUS | Status: DC

## 2021-11-15 MED ORDER — LIDOCAINE HCL (PF) 1 % IJ SOLN
INTRAMUSCULAR | Status: DC | PRN
  Administered 2021-11-15: 2 mL

## 2021-11-15 MED ORDER — HEPARIN (PORCINE) IN NACL 1000-0.9 UT/500ML-% IV SOLN
INTRAVENOUS | Status: AC
  Filled 2021-11-15: qty 1000

## 2021-11-15 MED ORDER — SODIUM CHLORIDE 0.9 % IV SOLN: 250.0000 mL | INTRAVENOUS | Status: DC | PRN

## 2021-11-15 SURGICAL SUPPLY — 11 items
CATH OPTITORQUE TIG 4.0 5F (CATHETERS) ×1 IMPLANT
DEVICE RAD COMP TR BAND LRG (VASCULAR PRODUCTS) ×1 IMPLANT
GLIDESHEATH SLEND SS 6F .021 (SHEATH) ×1 IMPLANT
GUIDEWIRE INQWIRE 1.5J.035X260 (WIRE) IMPLANT
INQWIRE 1.5J .035X260CM (WIRE) ×2
KIT HEART LEFT (KITS) ×2 IMPLANT
PACK CARDIAC CATHETERIZATION (CUSTOM PROCEDURE TRAY) ×2 IMPLANT
SHEATH PROBE COVER 6X72 (BAG) ×1 IMPLANT
TRANSDUCER W/STOPCOCK (MISCELLANEOUS) ×2 IMPLANT
TUBING CIL FLEX 10 FLL-RA (TUBING) ×2 IMPLANT
WIRE HI TORQ VERSACORE-J 145CM (WIRE) ×1 IMPLANT

## 2021-11-15 NOTE — Interval H&P Note (Signed)
Cath Lab Visit (complete for each Cath Lab visit) ? ?Clinical Evaluation Leading to the Procedure:  ? ?ACS: No. ? ?Non-ACS:   ? ?Anginal Classification: CCS III ? ?Anti-ischemic medical therapy: Maximal Therapy (2 or more classes of medications) ? ?Non-Invasive Test Results: No non-invasive testing performed ? ?Prior CABG: No previous CABG ? ? ? ? ? ?History and Physical Interval Note: ? ?11/15/2021 ?7:40 AM ? ?[name]  has presented today for surgery, with the diagnosis of angina.  The various methods of treatment have been discussed with the patient and family. After consideration of risks, benefits and other options for treatment, the patient has consented to  Procedure(s): ?LEFT HEART CATH AND CORONARY ANGIOGRAPHY (N/A) as a surgical intervention.  The patient's history has been reviewed, patient examined, no change in status, stable for surgery.  I have reviewed the patient's chart and labs.  Questions were answered to the patient's satisfaction.   ? ? ?Shelva Majestic ? ? ?

## 2021-11-16 ENCOUNTER — Telehealth: Payer: Self-pay | Admitting: *Deleted

## 2021-11-16 NOTE — Telephone Encounter (Signed)
Left VM for patient to call for PET scan appointment and prep. Also sent message via Mychart with request to confirm receipt via MyChart or call office. ?

## 2021-11-24 ENCOUNTER — Ambulatory Visit (HOSPITAL_COMMUNITY)
Admission: RE | Admit: 2021-11-24 | Discharge: 2021-11-24 | Disposition: A | Discharge status: Home / Self Care | Payer: Medicare Other | Source: Ambulatory Visit | Attending: Oncology | Admitting: Oncology

## 2021-11-24 DIAGNOSIS — C3492 Malignant neoplasm of unspecified part of left bronchus or lung: Secondary | ICD-10-CM | POA: Insufficient documentation

## 2021-11-24 LAB — GLUCOSE, CAPILLARY: Glucose-Capillary: 122 mg/dL — ABNORMAL HIGH (ref 70–99)

## 2021-11-24 MED ORDER — FLUDEOXYGLUCOSE F - 18 (FDG) INJECTION
8.9700 | Freq: Once | INTRAVENOUS | Status: AC
  Administered 2021-11-24: 8.97 via INTRAVENOUS

## 2021-11-27 ENCOUNTER — Other Ambulatory Visit: Payer: Self-pay | Admitting: Oncology

## 2021-11-29 ENCOUNTER — Encounter: Payer: Self-pay | Admitting: *Deleted

## 2021-11-29 ENCOUNTER — Encounter: Payer: Self-pay | Admitting: Oncology

## 2021-11-29 ENCOUNTER — Inpatient Hospital Stay: Payer: Medicare Other | Attending: Oncology

## 2021-11-29 ENCOUNTER — Inpatient Hospital Stay: Payer: Medicare Other

## 2021-11-29 ENCOUNTER — Inpatient Hospital Stay (HOSPITAL_BASED_OUTPATIENT_CLINIC_OR_DEPARTMENT_OTHER): Payer: Medicare Other | Admitting: Oncology

## 2021-11-29 DIAGNOSIS — Z79899 Other long term (current) drug therapy: Secondary | ICD-10-CM | POA: Diagnosis not present

## 2021-11-29 DIAGNOSIS — C7951 Secondary malignant neoplasm of bone: Secondary | ICD-10-CM | POA: Insufficient documentation

## 2021-11-29 DIAGNOSIS — C3492 Malignant neoplasm of unspecified part of left bronchus or lung: Secondary | ICD-10-CM

## 2021-11-29 DIAGNOSIS — C3432 Malignant neoplasm of lower lobe, left bronchus or lung: Secondary | ICD-10-CM | POA: Diagnosis present

## 2021-11-29 DIAGNOSIS — Z5112 Encounter for antineoplastic immunotherapy: Secondary | ICD-10-CM | POA: Insufficient documentation

## 2021-11-29 LAB — CBC WITH DIFFERENTIAL (CANCER CENTER ONLY)
Abs Immature Granulocytes: 0.02 10*3/uL (ref 0.00–0.07)
Basophils Absolute: 0 10*3/uL (ref 0.0–0.1)
Basophils Relative: 0 %
Eosinophils Absolute: 0.1 10*3/uL (ref 0.0–0.5)
Eosinophils Relative: 1 %
HCT: 33.3 % — ABNORMAL LOW (ref 36.0–46.0)
Hemoglobin: 10.8 g/dL — ABNORMAL LOW (ref 12.0–15.0)
Immature Granulocytes: 0 %
Lymphocytes Relative: 12 %
Lymphs Abs: 0.6 10*3/uL — ABNORMAL LOW (ref 0.7–4.0)
MCH: 29.8 pg (ref 26.0–34.0)
MCHC: 32.4 g/dL (ref 30.0–36.0)
MCV: 91.7 fL (ref 80.0–100.0)
Monocytes Absolute: 0.5 10*3/uL (ref 0.1–1.0)
Monocytes Relative: 10 %
Neutro Abs: 4 10*3/uL (ref 1.7–7.7)
Neutrophils Relative %: 77 %
Platelet Count: 141 10*3/uL — ABNORMAL LOW (ref 150–400)
RBC: 3.63 MIL/uL — ABNORMAL LOW (ref 3.87–5.11)
RDW: 15.8 % — ABNORMAL HIGH (ref 11.5–15.5)
WBC Count: 5.3 10*3/uL (ref 4.0–10.5)
nRBC: 0 % (ref 0.0–0.2)

## 2021-11-29 LAB — CMP (CANCER CENTER ONLY)
ALT: 18 U/L (ref 0–44)
AST: 16 U/L (ref 15–41)
Albumin: 4 g/dL (ref 3.5–5.0)
Alkaline Phosphatase: 57 U/L (ref 38–126)
Anion gap: 10 (ref 5–15)
BUN: 30 mg/dL — ABNORMAL HIGH (ref 8–23)
CO2: 23 mmol/L (ref 22–32)
Calcium: 9.4 mg/dL (ref 8.9–10.3)
Chloride: 102 mmol/L (ref 98–111)
Creatinine: 0.95 mg/dL (ref 0.44–1.00)
GFR, Estimated: 59 mL/min — ABNORMAL LOW (ref >60)
Glucose, Bld: 141 mg/dL — ABNORMAL HIGH (ref 70–99)
Potassium: 4.1 mmol/L (ref 3.5–5.1)
Sodium: 135 mmol/L (ref 135–145)
Total Bilirubin: 0.5 mg/dL (ref 0.3–1.2)
Total Protein: 6.5 g/dL (ref 6.5–8.1)

## 2021-11-29 LAB — TSH: TSH: 0.938 u[IU]/mL (ref 0.350–4.500)

## 2021-11-29 MED ORDER — HYDROCODONE-ACETAMINOPHEN 5-325 MG PO TABS: 1.0000 | ORAL_TABLET | Freq: Four times a day (QID) | ORAL | 0 refills | Status: DC | PRN

## 2021-11-29 MED ORDER — HEPARIN SOD (PORK) LOCK FLUSH 100 UNIT/ML IV SOLN
500.0000 [IU] | Freq: Once | INTRAVENOUS | Status: AC | PRN
  Administered 2021-11-29: 500 [IU]

## 2021-11-29 MED ORDER — SODIUM CHLORIDE 0.9 % IV SOLN: Freq: Once | INTRAVENOUS | Status: AC

## 2021-11-29 MED ORDER — SODIUM CHLORIDE 0.9 % IV SOLN
200.0000 mg | Freq: Once | INTRAVENOUS | Status: AC
  Administered 2021-11-29: 200 mg via INTRAVENOUS
  Filled 2021-11-29: qty 8

## 2021-11-29 MED ORDER — SODIUM CHLORIDE 0.9% FLUSH
10.0000 mL | INTRAVENOUS | Status: DC | PRN
  Administered 2021-11-29: 10 mL

## 2021-11-29 NOTE — Progress Notes (Signed)
Patient seen by Dr. Sherrill today ? ?Vitals are within treatment parameters. ? ?Labs reviewed by Dr. Sherrill and are within treatment parameters. ? ?Per physician team, patient is ready for treatment and there are NO modifications to the treatment plan.  ?

## 2021-11-29 NOTE — Progress Notes (Signed)
?[place] ?OFFICE PROGRESS NOTE ? ? ?Diagnosis: Non-small cell lung cancer ? ?INTERVAL HISTORY:  ? ?[name] completed another cycle of pembrolizumab 11/11/2021.  No rash or diarrhea.  She has persistent pain at multiple bone sites including low back.  She continues Medrol daily. ? ?Objective: ? ?Vital signs in last 24 hours: ? ?Blood pressure (!) 129/55, pulse 74, temperature 98.1 ?F (36.7 ?C), temperature source Oral, resp. rate 18, height _0  (1.651 m), weight 188 lb (85.3 kg), SpO2 96 %. ?  ? ? ?Resp: Lungs clear bilaterally ?Cardio: Regular rate and rhythm, 2/6 systolic murmur ?GI: No hepatosplenomegaly ?Vascular: No leg edema  ? ?Portacath/PICC-without erythema ? ?Lab Results: ? ?Lab Results  ?Component Value Date  ? WBC 5.3 11/29/2021  ? HGB 10.8 (L) 11/29/2021  ? HCT 33.3 (L) 11/29/2021  ? MCV 91.7 11/29/2021  ? PLT 141 (L) 11/29/2021  ? NEUTROABS 4.0 11/29/2021  ? ? ?CMP  ?Lab Results  ?Component Value Date  ? NA 138 11/11/2021  ? K 4.3 11/11/2021  ? CL 106 11/11/2021  ? CO2 24 11/11/2021  ? GLUCOSE 93 11/11/2021  ? BUN 35 (H) 11/11/2021  ? CREATININE 1.19 (H) 11/11/2021  ? CALCIUM 9.9 11/11/2021  ? PROT 6.9 11/11/2021  ? ALBUMIN 4.2 11/11/2021  ? AST 19 11/11/2021  ? ALT 21 11/11/2021  ? ALKPHOS 51 11/11/2021  ? BILITOT 0.4 11/11/2021  ? GFRNONAA 45 (L) 11/11/2021  ? GFRAA >60 04/21/2020  ? ? ? ?Medications: I have reviewed the patient's current medications. ? ? ?Assessment/Plan: ?Non-small cell lung cancer ?MRI lumbar spine 04/29/2019- enlarging marrow lesions involving the L1 vertebral body, upper left sacrum and right iliac bone ?MRI pelvis 04/29/2019- 3.5 cm left iliac bone lesion appears slightly larger; other similar appearing lesions present within the left superior pubic ramus, left superior abdomen acetabulum and upper left sacrum ?Kappa free light chains with mild elevation 05/12/2019  ?CTs 05/12/2019- left lower lobe pulmonary mass 3.3 x 3.2 cm; lytic process left iliac  bone; spinal lesions; 1.1 cm low-density left kidney lesion; right thyroid enlargement with heterogeneous appearance with potential for multiple discrete lesions ?Biopsy left lower lobe lung mass 05/26/2019-poorly differentiated carcinoma; positive for cytokeratin 5/6, p63 and TTF-1, no EGFR, BRAF, ALK, ERBB2,ROS, or NTRK alteration ?Cycle 1 carboplatin/Alimta/pembrolizumab 06/06/2019 ?Cycle 2 carboplatin/Alimta/pembrolizumab 06/27/2019 ?Cycle 3 carboplatin/Alimta/pembrolizumab 07/18/2019 ?Cycle 4 carboplatin/Alimta/pembrolizumab 08/07/2019 ?CTs 08/27/2019-significant decrease in size of lobulated mass left lower lobe.  Unchanged appearance of subtle bone lesions. ?Cycle 5 Alimta/pembrolizumab 08/28/2019 ?Cycle 6 Alimta/pembrolizumab 09/18/2019 ?Cycle 7 Alimta/pembrolizumab 10/09/2019 ?Cycle 8 Alimta/pembrolizumab 10/30/2019 ?Cycle 9 Alimta/pembrolizumab 11/20/2019 ?CTs 12/09/2019-no evidence of disease progression, left lower lobe nodule slightly decreased in size, stable L1, left sacral, and left pubic ramus metastases ?Cycle 10 Alimta/pembrolizumab 12/11/2019 ?Cycle 11 pembrolizumab alone 01/02/2020 (Alimta held due to edema, tenderness, erythema at the lower legs) ?Cycle 12 pembrolizumab 01/23/2020 ?Cycle 13 pembrolizumab 02/12/2020 ?Cycle 14 pembrolizumab 03/04/2020 ?CTs 03/18/2020-stable left lower lobe lesion, mild sclerosis at the superior endplate of L1 that was previously hypermetabolic, stable small left upper sacral lucent lesion, previous left superior pubic ramus lesion is occult on the CT, CT head negative for malignancy ?Cycle 15 pembrolizumab 03/25/2020 ?Cycle 16 pembrolizumab 04/21/2020 ?Cycle 17 pembrolizumab 05/17/2020 ?05/19/2020 bone scan-no definite abnormalities to suggest osseous metastases.  Areas of concern on prior PET-CT involving left iliac bone and L1 vertebral body showed no abnormalities on the current study ?Cycle 18 Pembrolizumab 06/10/2020 ?Cycle 19 Pembrolizumab 06/30/2020 ?CTs 07/16/2020-stable left  lower lobe nodule,  stable faint superior L1 vertebral lesion, no evidence of disease progression ?Cycle 20 pembrolizumab 07/21/2020 ?Cycle 21 Pembrolizumab 08/11/2020 ?Cycle 22 pembrolizumab 09/01/2020 ?Cycle 23 Pembrolizumab 09/22/2020 ?Cycle 24 pembrolizumab 10/13/2020 ?Cycle 25 Pembrolizumab 11/03/2020 ?Cycle 26 pembrolizumab 11/26/2020 ?CTs 12/15/2020- stable left lower lobe mass, stable sclerotic lesion at L2, no evidence of disease progression ?Cycle 27 pembrolizumab 12/17/2020 ?PET scan 01/03/2021-1.8 x 1.3 cm left lower lobe nodule similar in size to CT of 12/15/2020 and measures smaller than previous PET/CT from 2020.  Nodule is markedly hypermetabolic.  No evidence for hypermetabolic hilar or mediastinal lymphadenopathy.  Several tiny foci of hypermetabolism identified in bony anatomy raising concern for skeletal metastases.  Comparison of the PET to CT 07/16/2020-lobular left lower lobe pulmonary nodule measured 2.4 x 1.5 cm on the prior study, current study it measured 1.8 x 1.3 cm.  Lesion in the anterior left acetabulum is similar to the 07/16/2020 exam although overlying cortical thinning slightly more pronounced on the current study.  Described lesion in the scapula shows some cortical sclerosis and a tiny central marrow lucency not substantially changed compared to 07/16/2020.  Left third rib lesion shows heterogeneous mineralization similar to 07/16/2020. ?MRI of cervical spine 01/13/2021-increased left facet edema at C5-C6, severe facet arthrosis on the left at C7-T1 and on the right at C3-C4, no evidence of metastatic disease ?Cycle 28 Pembrolizumab 01/14/2021 ?Cycle 29 Pembrolizumab 02/03/2021 ?Cycle 30 Pembrolizumab 02/24/2021 ?MRI left hip 03/02/2021-compared to 04/29/2019, slight increase in size of metastases at the left iliac and left acetabulum.  Lesion at the left upper sacrum is less conspicuous and a lesion at the left superior pubic ramus is stable ?Cycle 31 pembrolizumab 03/17/2021 ?Radiation to left  acetabulum and left iliac 03/30/2021-04/13/2021 ?Cycle 32 pembrolizumab 04/07/2021 ?Cycle 33 Pembrolizumab 04/28/2021 ?Cycle 34 pembrolizumab 05/18/2021 ?PET scan 06/01/2021-no significant change in size or degree of FDG uptake associated with FDG avid left lower lobe lung nodule compatible with neoplasm.  Stable to improved appearance of multifocal FDG avid bone metastasis. ?Cycle 35 pembrolizumab 06/10/2021 ?Cycle 36 pembrolizumab 07/01/2021 ?Cycle 37 pembrolizumab 07/29/2021 ?Cycle 38 pembrolizumab 08/19/2021 ?Cycle 39 Pembrolizumab 09/09/2021 ?Cycle 40 pembrolizumab 09/30/2021 ?CT chest 10/20/2021 to evaluate complaints of chest pain-no PE.  Interval increase in left lower lobe mass. ?Cycle 41 Pembrolizumab 10/21/2021 ?Cycle 42 pembrolizumab 11/11/2021 ?PET 11/24/2021-mild increase in size of left lower lobe mass, no evidence of solid organ or nodal metastases, stable multifocal bone metastases.  No new sites of metastatic disease. ?Cycle 43 pembrolizumab 11/29/2021 ?Pain secondary to #1, improved ?Chronic back pain ?Type 2 diabetes ?Essential hypertension ?CAD ?Hyperlipidemia ?Family history significant for multiple members with breast cancer ?Grade 1 skin rash 07/18/2019 likely related to immunotherapy.  Topical steroid cream as needed. ?E. coli urinary tract infection 07/14/2019.  Completed cephalexin. ?Edema/tenderness at the right greater than left ankle 10/21/2019-etiology unclear, potentially related to systemic therapy or an infection, doxycycline prescribed-improved 10/23/2019; marked improvement 11/20/2019; at office visit 01/02/2020 she reports worsening of lower extremity edema, pain/tenderness, erythema 3 to 4 days following each treatment.  Alimta held 01/02/2020.  Referral to dermatology. ?COVID-19 infection 03/30/2020, monoclonal antibody therapy 04/06/2020 ?Arthritis, possibly related to Pembrolizumab, improved since beginning Medrol, now followed by Dr. Amil Amen ?  ?  ? ? ? ?Disposition: ?[name] appears stable.  The  restaging PET scan reveals stable disease.  She will continue pembrolizumab.  She will complete another cycle today.  I refilled a prescription for hydrocodone to use as needed for pain.  She continues follow

## 2021-11-29 NOTE — Patient Instructions (Signed)
[facility]   ?Discharge Instructions: ?Thank you for choosing [facility] to provide your oncology and hematology care.  ? ?If you have a lab appointment with the [facility], please go directly to the [facility] and check in at the registration area. ?  ?Wear comfortable clothing and clothing appropriate for easy access to any Portacath or PICC line.  ? ?We strive to give you quality time with your provider. You may need to reschedule your appointment if you arrive late (15 or more minutes).  Arriving late affects you and other patients whose appointments are after yours.  Also, if you miss three or more appointments without notifying the office, you may be dismissed from the clinic at the provider?s discretion.    ?  ?For prescription refill requests, have your pharmacy contact our office and allow 72 hours for refills to be completed.   ? ?Today you received the following chemotherapy and/or immunotherapy agents Keytruda ?    ?  ?To help prevent nausea and vomiting after your treatment, we encourage you to take your nausea medication as directed. ? ?BELOW ARE SYMPTOMS THAT SHOULD BE REPORTED IMMEDIATELY: ?*FEVER GREATER THAN 100.4 F (38 ?C) OR HIGHER ?*CHILLS OR SWEATING ?*NAUSEA AND VOMITING THAT IS NOT CONTROLLED WITH YOUR NAUSEA MEDICATION ?*UNUSUAL SHORTNESS OF BREATH ?*UNUSUAL BRUISING OR BLEEDING ?*URINARY PROBLEMS (pain or burning when urinating, or frequent urination) ?*BOWEL PROBLEMS (unusual diarrhea, constipation, pain near the anus) ?TENDERNESS IN MOUTH AND THROAT WITH OR WITHOUT PRESENCE OF ULCERS (sore throat, sores in mouth, or a toothache) ?UNUSUAL RASH, SWELLING OR PAIN  ?UNUSUAL VAGINAL DISCHARGE OR ITCHING  ? ?Items with * indicate a potential emergency and should be followed up as soon as possible or go to the Emergency Department if any problems should occur. ? ?Please show the CHEMOTHERAPY ALERT CARD or IMMUNOTHERAPY ALERT CARD at check-in to  the Emergency Department and triage nurse. ? ?Should you have questions after your visit or need to cancel or reschedule your appointment, please contact [facility]  Dept: [phone]  and follow the prompts.  Office hours are 8:00 a.m. to 4:30 p.m. Monday - Friday. Please note that voicemails left after 4:00 p.m. may not be returned until the following business day.  We are closed weekends and major holidays. You have access to a nurse at all times for urgent questions. Please call the main number to the clinic Dept: [phone] and follow the prompts. ? ? ?For any non-urgent questions, you may also contact your provider using MyChart. We now offer e-Visits for anyone 79 and older to request care online for non-urgent symptoms. For details visit [email or web address]. ?  ?Also download the MyChart app! Go to the app store, search "MyChart", open the app, select [facility], and log in with your MyChart username and password. ? ?Due to Covid, a mask is required upon entering the hospital/clinic. If you do not have a mask, one will be given to you upon arrival. For doctor visits, patients may have 1 support person aged 30 or older with them. For treatment visits, patients cannot have anyone with them due to current Covid guidelines and our immunocompromised population.  ? ?Pembrolizumab injection ?What is this medication? ?PEMBROLIZUMAB (pem broe liz ue mab) is a monoclonal antibody. It is used to treat certain types of cancer. ?This medicine may be used for other purposes; ask your health care provider or pharmacist if you have questions. ?COMMON BRAND NAME(S):  Keytruda ?What should I tell my care team before I take this medication? ?They need to know if you have any of these conditions: ?autoimmune diseases like Crohn's disease, ulcerative colitis, or lupus ?have had or planning to have an allogeneic stem cell transplant (uses someone else's stem cells) ?history of organ  transplant ?history of chest radiation ?nervous system problems like myasthenia gravis or Guillain-Barre syndrome ?an unusual or allergic reaction to pembrolizumab, other medicines, foods, dyes, or preservatives ?pregnant or trying to get pregnant ?breast-feeding ?How should I use this medication? ?This medicine is for infusion into a vein. It is given by a health care professional in a hospital or clinic setting. ?A special MedGuide will be given to you before each treatment. Be sure to read this information carefully each time. ?Talk to your pediatrician regarding the use of this medicine in children. While this drug may be prescribed for children as young as 6 months for selected conditions, precautions do apply. ?Overdosage: If you think you have taken too much of this medicine contact a poison control center or emergency room at once. ?NOTE: This medicine is only for you. Do not share this medicine with others. ?What if I miss a dose? ?It is important not to miss your dose. Call your doctor or health care professional if you are unable to keep an appointment. ?What may interact with this medication? ?Interactions have not been studied. ?This list may not describe all possible interactions. Give your health care provider a list of all the medicines, herbs, non-prescription drugs, or dietary supplements you use. Also tell them if you smoke, drink alcohol, or use illegal drugs. Some items may interact with your medicine. ?What should I watch for while using this medication? ?Your condition will be monitored carefully while you are receiving this medicine. ?You may need blood work done while you are taking this medicine. ?Do not become pregnant while taking this medicine or for 4 months after stopping it. Women should inform their doctor if they wish to become pregnant or think they might be pregnant. There is a potential for serious side effects to an unborn child. Talk to your health care professional or  pharmacist for more information. Do not breast-feed an infant while taking this medicine or for 4 months after the last dose. ?What side effects may I notice from receiving this medication? ?Side effects that you should report to your doctor or health care professional as soon as possible: ?allergic reactions like skin rash, itching or hives, swelling of the face, lips, or tongue ?bloody or black, tarry ?breathing problems ?changes in vision ?chest pain ?chills ?confusion ?constipation ?cough ?diarrhea ?dizziness or feeling faint or lightheaded ?fast or irregular heartbeat ?fever ?flushing ?joint pain ?low blood counts - this medicine may decrease the number of white blood cells, red blood cells and platelets. You may be at increased risk for infections and bleeding. ?muscle pain ?muscle weakness ?pain, tingling, numbness in the hands or feet ?persistent headache ?redness, blistering, peeling or loosening of the skin, including inside the mouth ?signs and symptoms of high blood sugar such as dizziness; dry mouth; dry skin; fruity breath; nausea; stomach pain; increased hunger or thirst; increased urination ?signs and symptoms of kidney injury like trouble passing urine or change in the amount of urine ?signs and symptoms of liver injury like dark urine, light-colored stools, loss of appetite, nausea, right upper belly pain, yellowing of the eyes or skin ?sweating ?swollen lymph nodes ?weight loss ?Side effects that usually do not  require medical attention (report to your doctor or health care professional if they continue or are bothersome): ?decreased appetite ?hair loss ?tiredness ?This list may not describe all possible side effects. Call your doctor for medical advice about side effects. You may report side effects to FDA at 1-800-FDA-1088. ?Where should I keep my medication? ?This drug is given in a hospital or clinic and will not be stored at home. ?NOTE: This sheet is a summary. It may not cover all possible  information. If you have questions about this medicine, talk to your doctor, pharmacist, or health care provider. ?? 2023 Elsevier/Gold Standard (2021-06-17 00:00:00) ? ?

## 2021-11-29 NOTE — Progress Notes (Signed)
Patient presents for treatment. RN assessment completed along with the following: ? ?Labs/vitals reviewed - Yes, and within treatment parameters.   ?Weight within 10% of previous measurement - Yes ?Informed consent completed and reflects current therapy/intent - Yes, on date 06/06/19             ?Provider progress note reviewed - Yes, today's provider note was reviewed. ?Treatment/Antibody/Supportive plan reviewed - Yes, and there are no adjustments needed for today's treatment. ?S&H and other orders reviewed - Yes, and there are no additional orders identified. ?Previous treatment date reviewed - Yes, and the appropriate amount of time has elapsed between treatments. ?Clinic Hand Off Received from - Cristy Friedlander, RN ? ?Patient to proceed with treatment.  ? ?

## 2021-12-02 ENCOUNTER — Other Ambulatory Visit: Payer: Medicare Other

## 2021-12-02 ENCOUNTER — Ambulatory Visit: Payer: Medicare Other | Admitting: Oncology

## 2021-12-02 ENCOUNTER — Ambulatory Visit: Payer: Medicare Other

## 2021-12-02 ENCOUNTER — Inpatient Hospital Stay: Payer: Medicare Other

## 2021-12-02 ENCOUNTER — Ambulatory Visit: Payer: Medicare Other | Admitting: Nurse Practitioner

## 2021-12-08 ENCOUNTER — Ambulatory Visit (INDEPENDENT_AMBULATORY_CARE_PROVIDER_SITE_OTHER): Payer: Medicare Other | Admitting: Cardiovascular Disease

## 2021-12-08 ENCOUNTER — Encounter: Payer: Self-pay | Admitting: Cardiovascular Disease

## 2021-12-08 VITALS — BP 122/57 | HR 54 | Ht 65.0 in | Wt 184.0 lb

## 2021-12-08 DIAGNOSIS — I5189 Other ill-defined heart diseases: Secondary | ICD-10-CM | POA: Diagnosis not present

## 2021-12-08 DIAGNOSIS — C3492 Malignant neoplasm of unspecified part of left bronchus or lung: Secondary | ICD-10-CM | POA: Diagnosis not present

## 2021-12-08 DIAGNOSIS — I10 Essential (primary) hypertension: Secondary | ICD-10-CM | POA: Diagnosis not present

## 2021-12-08 DIAGNOSIS — I25118 Atherosclerotic heart disease of native coronary artery with other forms of angina pectoris: Secondary | ICD-10-CM | POA: Diagnosis not present

## 2021-12-08 DIAGNOSIS — E118 Type 2 diabetes mellitus with unspecified complications: Secondary | ICD-10-CM | POA: Diagnosis not present

## 2021-12-08 MED ORDER — METOPROLOL TARTRATE 25 MG PO TABS: ORAL_TABLET | ORAL | 6 refills | Status: DC

## 2021-12-08 MED ORDER — ISOSORBIDE MONONITRATE ER 30 MG PO TB24: 30.0000 mg | ORAL_TABLET | Freq: Every day | ORAL | 6 refills | Status: DC

## 2021-12-08 NOTE — Progress Notes (Signed)
Patient ID: [name], female   DOB: 04/23/1935, 87 y.o.   MRN: [id number] ? ? ? ? ? ?HPI: [name] is a 87 y.o. female who presents to the office for a 6 week cardiology evaluation in follow-up of her cardiac catheterization.  ? ?[name] has known CAD and in March 2012 underwent stenting of a 90% eccentric RCA stenosis with a 3.0x15 mm integrity bare-metal stent. She developed recurrent chest pain in July 2012 and repeat catheterization revealed widely patent stent. She had a 60% postural diagonal 1 stenosis and medical therapy was recommended. She had mild luminal irregularities of the LAD. ? ?In 2012 an echo Doppler study showed normal systolic function with mild aortic sclerosis mild MR and mild TR. She has a history of hyperlipidemia, restless legs, as well as documented insulin resistance. ? ?She developed a herniated disc in her neck and underwent surgery by Dr. Sherley Bounds on November 30 1 2014. She tolerated surgery well from a cardiovascular standpoint. ? ?She has mild nerve damage from an episode of shingles which is improved with Neurontin. She does have restless legs which she takes Sinemet 25/100, one quarter of a tablet 2 times per day. She does have hyperlipidemia. She does have hypertension. She denies presyncope or syncope.   ? ?When I saw her in 2016 she had noticed development of rare chest pain when she walks fast or walks up a steep incline.  She underwent a nuclear perfusion study in October 2016 which showed normal perfusion and function without scar or ischemia.   ? ?She underwent eye surgery in  August 2017 involving her lacrimal duct.  She did have significant nosebleed following this. ? ?She underwent a follow-up echo Doppler study in December 2017 which showed normal ejection fraction at 55-60% with grade 1 diastolic dysfunction.  There was aortic valve sclerosis without stenosis.  She tells me that her metformin was discontinued this past year by Dr.  [name].  Laboratory had shown a hemoglobin A1c of 6.1.  In May 2018 LDL cholesterol was 63 on rosuvastatin.  I last saw her in February 2019.  At that time her blood pressure was stable she has continued to be on lisinopril 5 mg daily, metoprolol 37.5 mg twice a day for hypertension.  She has peripheral neuropathy on gabapentin 300 mg daily.  She has restless legs, and also takes carbidopa levodopa 25/100 mg one quarter of a tablet twice a day with improvement in symptomatology.  She denied any recurrent anginal symptoms.  There is mild shortness of breath with activity.  She is active.  I encouraged at least 150 minutes/week of exercise. ? ?I saw her in February 2020.  At that time felt fairly well from a cardiac standpoint.  She continues to experience some mild shortness of breath with activity without significant change.  She was walking her dog at least 20 minutes a day basis.  She has been documented to have diastolic dysfunction on echocardiography.  She was under  increased stress with her husband's illness who in addition to his bronchiectasis also has developed leukemia. ? ?I saw her in February 2021.  Since her prior evaluation with me she unfortunately  was diagnosed with non-small cell lung CA, stage IV with metastatic bone lesion.  She is followed by Dr. Benay Spice.  She completed 4 cycles of carboplatin/Alimta/pembrolizumab on August 07, 2019.  She had a remote history of smoking and had smoked 1 pack/day for 25 years and quit smoking in  1990, 31 years ago.  She denied any chest pain, PND orthopnea.  During that evaluation, on exam she had an early peaking systolic murmur felt consistent with probable mild aortic stenosis.  She had not had an echo Doppler study since 2017 and I commended a follow-up echo Doppler assessment. ? ?She underwent a follow-up echo Doppler study in September 26, 2019.  This showed an EF of 60 to 65%.  There was moderate asymmetric left ventricular hypertrophy of the basal  septum without S.A.M. or LVOT obstruction.  There was grade 1 diastolic dysfunction.  Estimated RV systolic pressure was 80.3 mm.  Aortic valve was tricuspid and there was no evidence for stenosis. ? ?I saw her in November 2021.  She continues to be followed by oncology for her metastatic lung cancer and  completed 18 cycles of pembrolizumab.  She continues to have back and hip pain.  She also developed a COVID-19 infection on March 30, 2020 and received monoclonal antibody therapy on April 06, 2020.  She denies any chest pain, PND orthopnea.  She denies any presyncope or syncope.   ? ?I  saw her on Dec 21, 2020.  At that time a recent CT had shown some mets to her bone.  She she continues to follow with Dr. Benay Spice and had another cycle of Keytruda on November 26, 2020.  At times she admits to rare angina and has taken sublingual nitroglycerin with benefit rarely.  She denies any prolonged episodes of chest pain.  She continues to be on lisinopril 10 mg, metoprolol tartrate 25 mg twice a day and was taking baby aspirin 3 times per week.  She is on rosuvastatin 40 mg daily for hyperlipidemia.   ? ?I last saw her on June 27, 2021.  Since her prior evaluation she had progressive difficulty with low back pain.  She also has undergone additional treatment with Keytruda followed by Dr. Benay Spice for her metastatic non-small cell lung CA, stage IV.  She tells me recent PET scan did show improvement in her lesions which are now smaller.  Due to progressive lower back discomfort, she was tentatively scheduled to undergo laminectomy and foraminectomy at L4-5 and L5-S1 by Dr. Sherley Bounds scheduled for July 18, 2021.  She denied any chest pain.  She had run out of her sublingual nitroglycerin and was in need for a renewal.  She was on amlodipine 2.5 mg, isosorbide 30 mg daily, lisinopril 10 mg, metoprolol tartrate 25 mg at bedtime, in addition to rosuvastatin 40 mg for significant hyperlipidemia.  She is no longer on  aspirin therapy.  She has noted some very mild lower extremity edema to her pretibial are. She has not had any anginal symptomatology.  She is diabetic on metformin.  She presented for follow-up evaluation and was given preoperative clearance for her upcoming surgery. ? ?Last saw her on November 01, 2021.  She continues to be followed for her non-small cell lung CA by Dr. Ammie Dalton and has received 41 cycles of pembrolizumab.  Recently, she has noticed more shortness of breath with activity and walking.  She has developed several episodes of chest tightness which occurred at night which have been nitrate responsive.  She states this discomfort commences in her chest and goes up to her neck and is very similar to her prior anginal symptomatology.  Due to her similar characteristics to her prior angina she feels she needs another cardiac catheterization.  That evaluation, I recommended that she undergo an echo Doppler study  and scheduled her for definitive repeat cardiac catheterization.  I recommended that she reinitiate aspirin therapy 81 mg.  I further titrated her metoprolol to tartrate from 25 mg twice a day to 37.5 mg in the morning and continue to take 25 mg at night and increase isosorbide to 60 mg daily.  She was continue to take amlodipine 2.5 mg and she has a prescription for lisinopril 10 mg daily and takes HCTZ 12.5 mg on a as needed basis. ? ?She underwent an echo Doppler study on November 09, 2021 showed normal LV function with EF 60 to 65% without wall motion abnormalities.  There was grade 1 diastolic dysfunction, normal right heart pressures, and mild aortic valve sclerosis without stenosis.  Definitive cardiac catheterization was performed on November 15, 2021 and she was not found to have significant obstructive disease.  Her mid RCA stent was widely patent and there was mild calcification and irregularity of the LAD with 10% narrowing, 25% mild diagonal stenosis and 10% narrowing in her RCA proximal to a  previously placed stent. ? ?Since her catheterization she has felt well.  However she has been taking her blood pressure and this typically has been low.  When she awakens her blood pressure typically is in the 90s

## 2021-12-08 NOTE — Patient Instructions (Addendum)
Medication Instructions:  ? ? ?Decrease Metoprolol tartrate to 25 mg in the morning  and 12.5 mg in the evening after taking  this for 2 weeks   if your morning blood pressure  systolic is less than 373  then decrease taking Lisinopril to 5 mg ( 1/2 tablet of 10 mg )  ? ?*If you need a refill on your cardiac medications before your next appointment, please call your pharmacy* ? ? ?Lab Work: ?Not needed ? ? ? ?Testing/Procedures: ? ?Not needed ? ?Follow-Up: ?At [facility], you and your health needs are our priority.  As part of our continuing mission to provide you with exceptional heart care, we have created designated Provider Care Teams.  These Care Teams include your primary Cardiologist (physician) and Advanced Practice Providers (APPs -  Physician Assistants and Nurse Practitioners) who all work together to provide you with the care you need, when you need it. ? ?  ? ?Your next appointment:   ?6 month(s) ? ?The format for your next appointment:   ?In Person ? ?Provider:   ?Shelva Majestic, MD  ? ? ?Other Instructions  ?

## 2021-12-17 ENCOUNTER — Other Ambulatory Visit: Payer: Self-pay | Admitting: Oncology

## 2021-12-20 ENCOUNTER — Inpatient Hospital Stay: Payer: Medicare Other

## 2021-12-20 ENCOUNTER — Inpatient Hospital Stay (HOSPITAL_BASED_OUTPATIENT_CLINIC_OR_DEPARTMENT_OTHER): Payer: Medicare Other | Admitting: Nurse Practitioner

## 2021-12-20 ENCOUNTER — Encounter: Payer: Self-pay | Admitting: Nurse Practitioner

## 2021-12-20 ENCOUNTER — Encounter: Payer: Self-pay | Admitting: *Deleted

## 2021-12-20 VITALS — BP 126/61 | HR 61 | Temp 98.8°F | Resp 16 | Wt 185.8 lb

## 2021-12-20 DIAGNOSIS — C3492 Malignant neoplasm of unspecified part of left bronchus or lung: Secondary | ICD-10-CM

## 2021-12-20 DIAGNOSIS — Z5112 Encounter for antineoplastic immunotherapy: Secondary | ICD-10-CM | POA: Diagnosis not present

## 2021-12-20 LAB — CMP (CANCER CENTER ONLY)
ALT: 20 U/L (ref 0–44)
AST: 18 U/L (ref 15–41)
Albumin: 4 g/dL (ref 3.5–5.0)
Alkaline Phosphatase: 50 U/L (ref 38–126)
Anion gap: 8 (ref 5–15)
BUN: 29 mg/dL — ABNORMAL HIGH (ref 8–23)
CO2: 26 mmol/L (ref 22–32)
Calcium: 9.6 mg/dL (ref 8.9–10.3)
Chloride: 104 mmol/L (ref 98–111)
Creatinine: 0.98 mg/dL (ref 0.44–1.00)
GFR, Estimated: 57 mL/min — ABNORMAL LOW (ref >60)
Glucose, Bld: 102 mg/dL — ABNORMAL HIGH (ref 70–99)
Potassium: 3.9 mmol/L (ref 3.5–5.1)
Sodium: 138 mmol/L (ref 135–145)
Total Bilirubin: 0.4 mg/dL (ref 0.3–1.2)
Total Protein: 6.5 g/dL (ref 6.5–8.1)

## 2021-12-20 LAB — CBC WITH DIFFERENTIAL (CANCER CENTER ONLY)
Abs Immature Granulocytes: 0.02 10*3/uL (ref 0.00–0.07)
Basophils Absolute: 0 10*3/uL (ref 0.0–0.1)
Basophils Relative: 0 %
Eosinophils Absolute: 0.1 10*3/uL (ref 0.0–0.5)
Eosinophils Relative: 1 %
HCT: 33.4 % — ABNORMAL LOW (ref 36.0–46.0)
Hemoglobin: 10.7 g/dL — ABNORMAL LOW (ref 12.0–15.0)
Immature Granulocytes: 0 %
Lymphocytes Relative: 27 %
Lymphs Abs: 1.4 10*3/uL (ref 0.7–4.0)
MCH: 29.5 pg (ref 26.0–34.0)
MCHC: 32 g/dL (ref 30.0–36.0)
MCV: 92 fL (ref 80.0–100.0)
Monocytes Absolute: 0.5 10*3/uL (ref 0.1–1.0)
Monocytes Relative: 9 %
Neutro Abs: 3.2 10*3/uL (ref 1.7–7.7)
Neutrophils Relative %: 63 %
Platelet Count: 159 10*3/uL (ref 150–400)
RBC: 3.63 MIL/uL — ABNORMAL LOW (ref 3.87–5.11)
RDW: 15.3 % (ref 11.5–15.5)
WBC Count: 5.3 10*3/uL (ref 4.0–10.5)
nRBC: 0 % (ref 0.0–0.2)

## 2021-12-20 MED ORDER — SODIUM CHLORIDE 0.9 % IV SOLN: Freq: Once | INTRAVENOUS | Status: AC

## 2021-12-20 MED ORDER — SODIUM CHLORIDE 0.9 % IV SOLN
200.0000 mg | Freq: Once | INTRAVENOUS | Status: AC
  Administered 2021-12-20: 200 mg via INTRAVENOUS
  Filled 2021-12-20: qty 8

## 2021-12-20 MED ORDER — HEPARIN SOD (PORK) LOCK FLUSH 100 UNIT/ML IV SOLN
500.0000 [IU] | Freq: Once | INTRAVENOUS | Status: AC | PRN
  Administered 2021-12-20: 500 [IU]

## 2021-12-20 MED ORDER — SODIUM CHLORIDE 0.9% FLUSH
10.0000 mL | INTRAVENOUS | Status: DC | PRN
  Administered 2021-12-20: 10 mL

## 2021-12-20 NOTE — Progress Notes (Signed)
Patient seen by [name] NP today  Vitals are within treatment parameters.  Labs reviewed by [name] NP and are within treatment parameters.  Per physician team, patient is ready for treatment and there are NO modifications to the treatment plan.     

## 2021-12-20 NOTE — Patient Instructions (Signed)

## 2021-12-20 NOTE — Patient Instructions (Signed)
[facility]   Discharge Instructions: Thank you for choosing [facility] to provide your oncology and hematology care.   If you have a lab appointment with the [facility], please go directly to the [facility] and check in at the registration area.   Wear comfortable clothing and clothing appropriate for easy access to any Portacath or PICC line.   We strive to give you quality time with your provider. You may need to reschedule your appointment if you arrive late (15 or more minutes).  Arriving late affects you and other patients whose appointments are after yours.  Also, if you miss three or more appointments without notifying the office, you may be dismissed from the clinic at the provider's discretion.      For prescription refill requests, have your pharmacy contact our office and allow 72 hours for refills to be completed.    Today you received the following chemotherapy and/or immunotherapy agents Keytruda       To help prevent nausea and vomiting after your treatment, we encourage you to take your nausea medication as directed.  BELOW ARE SYMPTOMS THAT SHOULD BE REPORTED IMMEDIATELY: *FEVER GREATER THAN 100.4 F (38 C) OR HIGHER *CHILLS OR SWEATING *NAUSEA AND VOMITING THAT IS NOT CONTROLLED WITH YOUR NAUSEA MEDICATION *UNUSUAL SHORTNESS OF BREATH *UNUSUAL BRUISING OR BLEEDING *URINARY PROBLEMS (pain or burning when urinating, or frequent urination) *BOWEL PROBLEMS (unusual diarrhea, constipation, pain near the anus) TENDERNESS IN MOUTH AND THROAT WITH OR WITHOUT PRESENCE OF ULCERS (sore throat, sores in mouth, or a toothache) UNUSUAL RASH, SWELLING OR PAIN  UNUSUAL VAGINAL DISCHARGE OR ITCHING   Items with * indicate a potential emergency and should be followed up as soon as possible or go to the Emergency Department if any problems should occur.  Please show the CHEMOTHERAPY ALERT CARD or IMMUNOTHERAPY ALERT CARD at check-in to  the Emergency Department and triage nurse.  Should you have questions after your visit or need to cancel or reschedule your appointment, please contact [facility]  Dept: [phone]  and follow the prompts.  Office hours are 8:00 a.m. to 4:30 p.m. Monday - Friday. Please note that voicemails left after 4:00 p.m. may not be returned until the following business day.  We are closed weekends and major holidays. You have access to a nurse at all times for urgent questions. Please call the main number to the clinic Dept: [phone] and follow the prompts.   For any non-urgent questions, you may also contact your provider using MyChart. We now offer e-Visits for anyone 85 and older to request care online for non-urgent symptoms. For details visit [email or web address].   Also download the MyChart app! Go to the app store, search "MyChart", open the app, select [facility], and log in with your MyChart username and password.  Due to Covid, a mask is required upon entering the hospital/clinic. If you do not have a mask, one will be given to you upon arrival. For doctor visits, patients may have 1 support person aged 37 or older with them. For treatment visits, patients cannot have anyone with them due to current Covid guidelines and our immunocompromised population.   Pembrolizumab injection What is this medication? PEMBROLIZUMAB (pem broe liz ue mab) is a monoclonal antibody. It is used to treat certain types of cancer. This medicine may be used for other purposes; ask your health care provider or pharmacist if you have questions. COMMON BRAND NAME(S):  Keytruda What should I tell my care team before I take this medication? They need to know if you have any of these conditions: autoimmune diseases like Crohn's disease, ulcerative colitis, or lupus have had or planning to have an allogeneic stem cell transplant (uses someone else's stem cells) history of organ  transplant history of chest radiation nervous system problems like myasthenia gravis or Guillain-Barre syndrome an unusual or allergic reaction to pembrolizumab, other medicines, foods, dyes, or preservatives pregnant or trying to get pregnant breast-feeding How should I use this medication? This medicine is for infusion into a vein. It is given by a health care professional in a hospital or clinic setting. A special MedGuide will be given to you before each treatment. Be sure to read this information carefully each time. Talk to your pediatrician regarding the use of this medicine in children. While this drug may be prescribed for children as young as 6 months for selected conditions, precautions do apply. Overdosage: If you think you have taken too much of this medicine contact a poison control center or emergency room at once. NOTE: This medicine is only for you. Do not share this medicine with others. What if I miss a dose? It is important not to miss your dose. Call your doctor or health care professional if you are unable to keep an appointment. What may interact with this medication? Interactions have not been studied. This list may not describe all possible interactions. Give your health care provider a list of all the medicines, herbs, non-prescription drugs, or dietary supplements you use. Also tell them if you smoke, drink alcohol, or use illegal drugs. Some items may interact with your medicine. What should I watch for while using this medication? Your condition will be monitored carefully while you are receiving this medicine. You may need blood work done while you are taking this medicine. Do not become pregnant while taking this medicine or for 4 months after stopping it. Women should inform their doctor if they wish to become pregnant or think they might be pregnant. There is a potential for serious side effects to an unborn child. Talk to your health care professional or  pharmacist for more information. Do not breast-feed an infant while taking this medicine or for 4 months after the last dose. What side effects may I notice from receiving this medication? Side effects that you should report to your doctor or health care professional as soon as possible: allergic reactions like skin rash, itching or hives, swelling of the face, lips, or tongue bloody or black, tarry breathing problems changes in vision chest pain chills confusion constipation cough diarrhea dizziness or feeling faint or lightheaded fast or irregular heartbeat fever flushing joint pain low blood counts - this medicine may decrease the number of white blood cells, red blood cells and platelets. You may be at increased risk for infections and bleeding. muscle pain muscle weakness pain, tingling, numbness in the hands or feet persistent headache redness, blistering, peeling or loosening of the skin, including inside the mouth signs and symptoms of high blood sugar such as dizziness; dry mouth; dry skin; fruity breath; nausea; stomach pain; increased hunger or thirst; increased urination signs and symptoms of kidney injury like trouble passing urine or change in the amount of urine signs and symptoms of liver injury like dark urine, light-colored stools, loss of appetite, nausea, right upper belly pain, yellowing of the eyes or skin sweating swollen lymph nodes weight loss Side effects that usually do not  require medical attention (report to your doctor or health care professional if they continue or are bothersome): decreased appetite hair loss tiredness This list may not describe all possible side effects. Call your doctor for medical advice about side effects. You may report side effects to FDA at 1-800-FDA-1088. Where should I keep my medication? This drug is given in a hospital or clinic and will not be stored at home. NOTE: This sheet is a summary. It may not cover all possible  information. If you have questions about this medicine, talk to your doctor, pharmacist, or health care provider.  2023 Elsevier/Gold Standard (2021-06-17 00:00:00)

## 2021-12-20 NOTE — Progress Notes (Signed)
Patient presents for treatment. RN assessment completed along with the following:  Labs/vitals reviewed - Yes, and within treatment parameters.   Weight within 10% of previous measurement - Yes Informed consent completed and reflects current therapy/intent - Yes, on date 06/16/19             Provider progress note reviewed - Yes, today's provider note was reviewed. Treatment/Antibody/Supportive plan reviewed - Yes, and there are no adjustments needed for today's treatment. S&H and other orders reviewed - Yes, and there are no additional orders identified. Previous treatment date reviewed - Yes, and the appropriate amount of time has elapsed between treatments. Clinic Hand Off Received from - Merceda Elks, RN  Patient to proceed with treatment.

## 2021-12-20 NOTE — Progress Notes (Signed)
[facility] OFFICE PROGRESS NOTE   Diagnosis: Non-small cell lung cancer  INTERVAL HISTORY:   [name] returns as scheduled.  She completed another cycle of Pembrolizumab 11/29/2021.  No rash or diarrhea.  Arthritis pain continues to be improved.  Objective:  Vital signs in last 24 hours:  Blood pressure 126/61, pulse 61, temperature 98.8 F (37.1 C), temperature source Tympanic, resp. rate 16, weight 185 lb 12.8 oz (84.3 kg), SpO2 96 %.    HEENT: No thrush or ulcers. Resp: Lungs clear bilaterally. Cardio: Regular rate and rhythm. GI: Abdomen soft and nontender.  No hepatosplenomegaly. Vascular: No leg edema. Skin: No rash. Port-A-Cath without erythema.   Lab Results:  Lab Results  Component Value Date   WBC 5.3 12/20/2021   HGB 10.7 (L) 12/20/2021   HCT 33.4 (L) 12/20/2021   MCV 92.0 12/20/2021   PLT 159 12/20/2021   NEUTROABS 3.2 12/20/2021    Imaging:  No results found.  Medications: I have reviewed the patient's current medications.  Assessment/Plan: Non-small cell lung cancer MRI lumbar spine 04/29/2019- enlarging marrow lesions involving the L1 vertebral body, upper left sacrum and right iliac bone MRI pelvis 04/29/2019- 3.5 cm left iliac bone lesion [name] slightly larger; other similar appearing lesions present within the left superior pubic ramus, left superior abdomen acetabulum and upper left sacrum Kappa free light chains with mild elevation 05/12/2019  CTs 05/12/2019- left lower lobe pulmonary mass 3.3 x 3.2 cm; lytic process left iliac bone; spinal lesions; 1.1 cm low-density left kidney lesion; right thyroid enlargement with heterogeneous appearance with potential for multiple discrete lesions Biopsy left lower lobe lung mass 05/26/2019-poorly differentiated carcinoma; positive for cytokeratin 5/6, p63 and TTF-1, no EGFR, BRAF, ALK, ERBB2,ROS, or NTRK alteration Cycle 1 carboplatin/Alimta/pembrolizumab 06/06/2019 Cycle 2  carboplatin/Alimta/pembrolizumab 06/27/2019 Cycle 3 carboplatin/Alimta/pembrolizumab 07/18/2019 Cycle 4 carboplatin/Alimta/pembrolizumab 08/07/2019 CTs 08/27/2019-significant decrease in size of lobulated mass left lower lobe.  Unchanged appearance of subtle bone lesions. Cycle 5 Alimta/pembrolizumab 08/28/2019 Cycle 6 Alimta/pembrolizumab 09/18/2019 Cycle 7 Alimta/pembrolizumab 10/09/2019 Cycle 8 Alimta/pembrolizumab 10/30/2019 Cycle 9 Alimta/pembrolizumab 11/20/2019 CTs 12/09/2019-no evidence of disease progression, left lower lobe nodule slightly decreased in size, stable L1, left sacral, and left pubic ramus metastases Cycle 10 Alimta/pembrolizumab 12/11/2019 Cycle 11 pembrolizumab alone 01/02/2020 (Alimta held due to edema, tenderness, erythema at the lower legs) Cycle 12 pembrolizumab 01/23/2020 Cycle 13 pembrolizumab 02/12/2020 Cycle 14 pembrolizumab 03/04/2020 CTs 03/18/2020-stable left lower lobe lesion, mild sclerosis at the superior endplate of L1 that was previously hypermetabolic, stable small left upper sacral lucent lesion, previous left superior pubic ramus lesion is occult on the CT, CT head negative for malignancy Cycle 15 pembrolizumab 03/25/2020 Cycle 16 pembrolizumab 04/21/2020 Cycle 17 pembrolizumab 05/17/2020 05/19/2020 bone scan-no definite abnormalities to suggest osseous metastases.  Areas of concern on prior PET-CT involving left iliac bone and L1 vertebral body showed no abnormalities on the current study Cycle 18 Pembrolizumab 06/10/2020 Cycle 19 Pembrolizumab 06/30/2020 CTs 07/16/2020-stable left lower lobe nodule, stable faint superior L1 vertebral lesion, no evidence of disease progression Cycle 20 pembrolizumab 07/21/2020 Cycle 21 Pembrolizumab 08/11/2020 Cycle 22 pembrolizumab 09/01/2020 Cycle 23 Pembrolizumab 09/22/2020 Cycle 24 pembrolizumab 10/13/2020 Cycle 25 Pembrolizumab 11/03/2020 Cycle 26 pembrolizumab 11/26/2020 CTs 12/15/2020- stable left lower lobe mass, stable sclerotic  lesion at L2, no evidence of disease progression Cycle 27 pembrolizumab 12/17/2020 PET scan 01/03/2021-1.8 x 1.3 cm left lower lobe nodule similar in size to CT of 12/15/2020 and measures smaller than previous PET/CT from 2020.  Nodule is markedly hypermetabolic.  No evidence for hypermetabolic hilar or mediastinal lymphadenopathy.  Several tiny foci of hypermetabolism identified in bony anatomy raising concern for skeletal metastases.  Comparison of the PET to CT 07/16/2020-lobular left lower lobe pulmonary nodule measured 2.4 x 1.5 cm on the prior study, current study it measured 1.8 x 1.3 cm.  Lesion in the anterior left acetabulum is similar to the 07/16/2020 exam although overlying cortical thinning slightly more pronounced on the current study.  Described lesion in the scapula shows some cortical sclerosis and a tiny central marrow lucency not substantially changed compared to 07/16/2020.  Left third rib lesion shows heterogeneous mineralization similar to 07/16/2020. MRI of cervical spine 01/13/2021-increased left facet edema at C5-C6, severe facet arthrosis on the left at C7-T1 and on the right at C3-C4, no evidence of metastatic disease Cycle 28 Pembrolizumab 01/14/2021 Cycle 29 Pembrolizumab 02/03/2021 Cycle 30 Pembrolizumab 02/24/2021 MRI left hip 03/02/2021-compared to 04/29/2019, slight increase in size of metastases at the left iliac and left acetabulum.  Lesion at the left upper sacrum is less conspicuous and a lesion at the left superior pubic ramus is stable Cycle 31 pembrolizumab 03/17/2021 Radiation to left acetabulum and left iliac 03/30/2021-04/13/2021 Cycle 32 pembrolizumab 04/07/2021 Cycle 33 Pembrolizumab 04/28/2021 Cycle 34 pembrolizumab 05/18/2021 PET scan 06/01/2021-no significant change in size or degree of FDG uptake associated with FDG avid left lower lobe lung nodule compatible with neoplasm.  Stable to improved appearance of multifocal FDG avid bone metastasis. Cycle 35 pembrolizumab  06/10/2021 Cycle 36 pembrolizumab 07/01/2021 Cycle 37 pembrolizumab 07/29/2021 Cycle 38 pembrolizumab 08/19/2021 Cycle 39 Pembrolizumab 09/09/2021 Cycle 40 pembrolizumab 09/30/2021 CT chest 10/20/2021 to evaluate complaints of chest pain-no PE.  Interval increase in left lower lobe mass. Cycle 41 Pembrolizumab 10/21/2021 Cycle 42 pembrolizumab 11/11/2021 PET 11/24/2021-mild increase in size of left lower lobe mass, no evidence of solid organ or nodal metastases, stable multifocal bone metastases.  No new sites of metastatic disease. Cycle 43 pembrolizumab 11/29/2021 Cycle 45 Pembrolizumab 12/20/2021 Pain secondary to #1, improved Chronic back pain Type 2 diabetes Essential hypertension CAD Hyperlipidemia Family history significant for multiple members with breast cancer Grade 1 skin rash 07/18/2019 likely related to immunotherapy.  Topical steroid cream as needed. E. coli urinary tract infection 07/14/2019.  Completed cephalexin. Edema/tenderness at the right greater than left ankle 10/21/2019-etiology unclear, potentially related to systemic therapy or an infection, doxycycline prescribed-improved 10/23/2019; marked improvement 11/20/2019; at office visit 01/02/2020 she reports worsening of lower extremity edema, pain/tenderness, erythema 3 to 4 days following each treatment.  Alimta held 01/02/2020.  Referral to dermatology. COVID-19 infection 03/30/2020, monoclonal antibody therapy 04/06/2020 Arthritis, possibly related to Pembrolizumab, improved since beginning Medrol, now followed by Dr. Amil Amen    Disposition: [name] [name] stable.  She continues every 3-week Pembrolizumab.  There is no clinical evidence of disease progression.  Plan to proceed with Pembrolizumab today as scheduled.  CBC and chemistry panel reviewed.  Labs adequate to proceed as above.  She will return for lab, office visit and Pembrolizumab in 3 weeks.  We are available to see her sooner if needed.    Ned Card ANP/GNP-BC    12/20/2021  9:37 AM

## 2022-01-08 ENCOUNTER — Other Ambulatory Visit: Payer: Self-pay | Admitting: Oncology

## 2022-01-09 ENCOUNTER — Inpatient Hospital Stay: Payer: Medicare Other | Attending: Oncology

## 2022-01-09 ENCOUNTER — Encounter: Payer: Self-pay | Admitting: Nurse Practitioner

## 2022-01-09 ENCOUNTER — Other Ambulatory Visit: Payer: Self-pay

## 2022-01-09 ENCOUNTER — Inpatient Hospital Stay: Payer: Medicare Other

## 2022-01-09 ENCOUNTER — Inpatient Hospital Stay (HOSPITAL_BASED_OUTPATIENT_CLINIC_OR_DEPARTMENT_OTHER): Payer: Medicare Other | Admitting: Nurse Practitioner

## 2022-01-09 VITALS — BP 150/78 | HR 60 | Temp 97.8°F | Resp 18 | Ht 65.0 in | Wt 187.6 lb

## 2022-01-09 DIAGNOSIS — C7951 Secondary malignant neoplasm of bone: Secondary | ICD-10-CM | POA: Insufficient documentation

## 2022-01-09 DIAGNOSIS — Z5112 Encounter for antineoplastic immunotherapy: Secondary | ICD-10-CM | POA: Insufficient documentation

## 2022-01-09 DIAGNOSIS — Z79899 Other long term (current) drug therapy: Secondary | ICD-10-CM | POA: Insufficient documentation

## 2022-01-09 DIAGNOSIS — C3432 Malignant neoplasm of lower lobe, left bronchus or lung: Secondary | ICD-10-CM | POA: Insufficient documentation

## 2022-01-09 DIAGNOSIS — C3492 Malignant neoplasm of unspecified part of left bronchus or lung: Secondary | ICD-10-CM

## 2022-01-09 LAB — CMP (CANCER CENTER ONLY)
ALT: 17 U/L (ref 0–44)
AST: 17 U/L (ref 15–41)
Albumin: 4.3 g/dL (ref 3.5–5.0)
Alkaline Phosphatase: 52 U/L (ref 38–126)
Anion gap: 11 (ref 5–15)
BUN: 24 mg/dL — ABNORMAL HIGH (ref 8–23)
CO2: 26 mmol/L (ref 22–32)
Calcium: 10.1 mg/dL (ref 8.9–10.3)
Chloride: 100 mmol/L (ref 98–111)
Creatinine: 0.88 mg/dL (ref 0.44–1.00)
GFR, Estimated: >60 mL/min (ref >60)
Glucose, Bld: 112 mg/dL — ABNORMAL HIGH (ref 70–99)
Potassium: 4.1 mmol/L (ref 3.5–5.1)
Sodium: 137 mmol/L (ref 135–145)
Total Bilirubin: 0.5 mg/dL (ref 0.3–1.2)
Total Protein: 6.8 g/dL (ref 6.5–8.1)

## 2022-01-09 LAB — CBC WITH DIFFERENTIAL (CANCER CENTER ONLY)
Abs Immature Granulocytes: 0.02 10*3/uL (ref 0.00–0.07)
Basophils Absolute: 0 10*3/uL (ref 0.0–0.1)
Basophils Relative: 0 %
Eosinophils Absolute: 0.1 10*3/uL (ref 0.0–0.5)
Eosinophils Relative: 1 %
HCT: 35.9 % — ABNORMAL LOW (ref 36.0–46.0)
Hemoglobin: 11.3 g/dL — ABNORMAL LOW (ref 12.0–15.0)
Immature Granulocytes: 0 %
Lymphocytes Relative: 20 %
Lymphs Abs: 0.9 10*3/uL (ref 0.7–4.0)
MCH: 29.7 pg (ref 26.0–34.0)
MCHC: 31.5 g/dL (ref 30.0–36.0)
MCV: 94.2 fL (ref 80.0–100.0)
Monocytes Absolute: 0.4 10*3/uL (ref 0.1–1.0)
Monocytes Relative: 8 %
Neutro Abs: 3.3 10*3/uL (ref 1.7–7.7)
Neutrophils Relative %: 71 %
Platelet Count: 146 10*3/uL — ABNORMAL LOW (ref 150–400)
RBC: 3.81 MIL/uL — ABNORMAL LOW (ref 3.87–5.11)
RDW: 15 % (ref 11.5–15.5)
WBC Count: 4.7 10*3/uL (ref 4.0–10.5)
nRBC: 0 % (ref 0.0–0.2)

## 2022-01-09 LAB — TSH: TSH: 0.777 u[IU]/mL (ref 0.350–4.500)

## 2022-01-09 MED ORDER — SODIUM CHLORIDE 0.9% FLUSH
10.0000 mL | INTRAVENOUS | Status: DC | PRN
  Administered 2022-01-09: 10 mL

## 2022-01-09 MED ORDER — HEPARIN SOD (PORK) LOCK FLUSH 100 UNIT/ML IV SOLN
500.0000 [IU] | Freq: Once | INTRAVENOUS | Status: AC | PRN
  Administered 2022-01-09: 500 [IU]

## 2022-01-09 MED ORDER — SODIUM CHLORIDE 0.9 % IV SOLN
200.0000 mg | Freq: Once | INTRAVENOUS | Status: AC
  Administered 2022-01-09: 200 mg via INTRAVENOUS
  Filled 2022-01-09: qty 8

## 2022-01-09 MED ORDER — SODIUM CHLORIDE 0.9 % IV SOLN: Freq: Once | INTRAVENOUS | Status: AC

## 2022-01-09 NOTE — Progress Notes (Addendum)
[facility] OFFICE PROGRESS NOTE   Diagnosis: Non-small cell lung cancer  INTERVAL HISTORY:   [name] returns as scheduled.  She completed another cycle of Pembrolizumab 12/20/2021.  No rash or diarrhea.  She describes pain as "okay".  She reports intermittent hoarseness and phlegm over the past few months.  No cough, fever, shortness of breath.  Objective:  Vital signs in last 24 hours:  Blood pressure (!) 150/78, pulse 60, temperature 97.8 F (36.6 C), temperature source Oral, resp. rate 18, height _0  (1.651 m), weight 187 lb 9.6 oz (85.1 kg), SpO2 98 %.    HEENT: No thrush or ulcers.  Posterior pharynx without erythema. Resp: Lungs clear bilaterally. Cardio: Regular rate and rhythm. GI: Abdomen soft and nontender.  No hepatosplenomegaly. Vascular: No leg edema. Neuro: Alert and oriented. Skin: No rash. Port-A-Cath without erythema.   Lab Results:  Lab Results  Component Value Date   WBC 4.7 01/09/2022   HGB 11.3 (L) 01/09/2022   HCT 35.9 (L) 01/09/2022   MCV 94.2 01/09/2022   PLT 146 (L) 01/09/2022   NEUTROABS 3.3 01/09/2022    Imaging:  No results found.  Medications: I have reviewed the patient's current medications.  Assessment/Plan: Non-small cell lung cancer MRI lumbar spine 04/29/2019- enlarging marrow lesions involving the L1 vertebral body, upper left sacrum and right iliac bone MRI pelvis 04/29/2019- 3.5 cm left iliac bone lesion appears slightly larger; other similar appearing lesions present within the left superior pubic ramus, left superior abdomen acetabulum and upper left sacrum Kappa free light chains with mild elevation 05/12/2019  CTs 05/12/2019- left lower lobe pulmonary mass 3.3 x 3.2 cm; lytic process left iliac bone; spinal lesions; 1.1 cm low-density left kidney lesion; right thyroid enlargement with heterogeneous appearance with potential for multiple discrete lesions Biopsy left lower lobe lung mass 05/26/2019-poorly  differentiated carcinoma; positive for cytokeratin 5/6, p63 and TTF-1, no EGFR, BRAF, ALK, ERBB2,ROS, or NTRK alteration Cycle 1 carboplatin/Alimta/pembrolizumab 06/06/2019 Cycle 2 carboplatin/Alimta/pembrolizumab 06/27/2019 Cycle 3 carboplatin/Alimta/pembrolizumab 07/18/2019 Cycle 4 carboplatin/Alimta/pembrolizumab 08/07/2019 CTs 08/27/2019-significant decrease in size of lobulated mass left lower lobe.  Unchanged appearance of subtle bone lesions. Cycle 5 Alimta/pembrolizumab 08/28/2019 Cycle 6 Alimta/pembrolizumab 09/18/2019 Cycle 7 Alimta/pembrolizumab 10/09/2019 Cycle 8 Alimta/pembrolizumab 10/30/2019 Cycle 9 Alimta/pembrolizumab 11/20/2019 CTs 12/09/2019-no evidence of disease progression, left lower lobe nodule slightly decreased in size, stable L1, left sacral, and left pubic ramus metastases Cycle 10 Alimta/pembrolizumab 12/11/2019 Cycle 11 pembrolizumab alone 01/02/2020 (Alimta held due to edema, tenderness, erythema at the lower legs) Cycle 12 pembrolizumab 01/23/2020 Cycle 13 pembrolizumab 02/12/2020 Cycle 14 pembrolizumab 03/04/2020 CTs 03/18/2020-stable left lower lobe lesion, mild sclerosis at the superior endplate of L1 that was previously hypermetabolic, stable small left upper sacral lucent lesion, previous left superior pubic ramus lesion is occult on the CT, CT head negative for malignancy Cycle 15 pembrolizumab 03/25/2020 Cycle 16 pembrolizumab 04/21/2020 Cycle 17 pembrolizumab 05/17/2020 05/19/2020 bone scan-no definite abnormalities to suggest osseous metastases.  Areas of concern on prior PET-CT involving left iliac bone and L1 vertebral body showed no abnormalities on the current study Cycle 18 Pembrolizumab 06/10/2020 Cycle 19 Pembrolizumab 06/30/2020 CTs 07/16/2020-stable left lower lobe nodule, stable faint superior L1 vertebral lesion, no evidence of disease progression Cycle 20 pembrolizumab 07/21/2020 Cycle 21 Pembrolizumab 08/11/2020 Cycle 22 pembrolizumab 09/01/2020 Cycle 23  Pembrolizumab 09/22/2020 Cycle 24 pembrolizumab 10/13/2020 Cycle 25 Pembrolizumab 11/03/2020 Cycle 26 pembrolizumab 11/26/2020 CTs 12/15/2020- stable left lower lobe mass, stable sclerotic lesion at L2, no evidence of disease progression Cycle  27 pembrolizumab 12/17/2020 PET scan 01/03/2021-1.8 x 1.3 cm left lower lobe nodule similar in size to CT of 12/15/2020 and measures smaller than previous PET/CT from 2020.  Nodule is markedly hypermetabolic.  No evidence for hypermetabolic hilar or mediastinal lymphadenopathy.  Several tiny foci of hypermetabolism identified in bony anatomy raising concern for skeletal metastases.  Comparison of the PET to CT 07/16/2020-lobular left lower lobe pulmonary nodule measured 2.4 x 1.5 cm on the prior study, current study it measured 1.8 x 1.3 cm.  Lesion in the anterior left acetabulum is similar to the 07/16/2020 exam although overlying cortical thinning slightly more pronounced on the current study.  Described lesion in the scapula shows some cortical sclerosis and a tiny central marrow lucency not substantially changed compared to 07/16/2020.  Left third rib lesion shows heterogeneous mineralization similar to 07/16/2020. MRI of cervical spine 01/13/2021-increased left facet edema at C5-C6, severe facet arthrosis on the left at C7-T1 and on the right at C3-C4, no evidence of metastatic disease Cycle 28 Pembrolizumab 01/14/2021 Cycle 29 Pembrolizumab 02/03/2021 Cycle 30 Pembrolizumab 02/24/2021 MRI left hip 03/02/2021-compared to 04/29/2019, slight increase in size of metastases at the left iliac and left acetabulum.  Lesion at the left upper sacrum is less conspicuous and a lesion at the left superior pubic ramus is stable Cycle 31 pembrolizumab 03/17/2021 Radiation to left acetabulum and left iliac 03/30/2021-04/13/2021 Cycle 32 pembrolizumab 04/07/2021 Cycle 33 Pembrolizumab 04/28/2021 Cycle 34 pembrolizumab 05/18/2021 PET scan 06/01/2021-no significant change in size or degree of FDG  uptake associated with FDG avid left lower lobe lung nodule compatible with neoplasm.  Stable to improved appearance of multifocal FDG avid bone metastasis. Cycle 35 pembrolizumab 06/10/2021 Cycle 36 pembrolizumab 07/01/2021 Cycle 37 pembrolizumab 07/29/2021 Cycle 38 pembrolizumab 08/19/2021 Cycle 39 Pembrolizumab 09/09/2021 Cycle 40 pembrolizumab 09/30/2021 CT chest 10/20/2021 to evaluate complaints of chest pain-no PE.  Interval increase in left lower lobe mass. Cycle 41 Pembrolizumab 10/21/2021 Cycle 42 pembrolizumab 11/11/2021 PET 11/24/2021-mild increase in size of left lower lobe mass, no evidence of solid organ or nodal metastases, stable multifocal bone metastases.  No new sites of metastatic disease. Cycle 43 pembrolizumab 11/29/2021 Cycle 45 Pembrolizumab 12/20/2021 Cycle 46 Pembrolizumab 01/09/2022 Pain secondary to #1, improved Chronic back pain Type 2 diabetes Essential hypertension CAD Hyperlipidemia Family history significant for multiple members with breast cancer Grade 1 skin rash 07/18/2019 likely related to immunotherapy.  Topical steroid cream as needed. E. coli urinary tract infection 07/14/2019.  Completed cephalexin. Edema/tenderness at the right greater than left ankle 10/21/2019-etiology unclear, potentially related to systemic therapy or an infection, doxycycline prescribed-improved 10/23/2019; marked improvement 11/20/2019; at office visit 01/02/2020 she reports worsening of lower extremity edema, pain/tenderness, erythema 3 to 4 days following each treatment.  Alimta held 01/02/2020.  Referral to dermatology. COVID-19 infection 03/30/2020, monoclonal antibody therapy 04/06/2020 Arthritis, possibly related to Pembrolizumab, improved since beginning Medrol, now followed by Dr. Amil Amen  Disposition: [name] appears stable.  She is on active treatment with every 3-week Pembrolizumab.  Plan to continue the same.  CBC and chemistry panel reviewed.  Labs adequate to proceed with  treatment.  We discussed a referral to ENT to evaluate the hoarseness.  She would like to hold off on this for now.  She will return for lab, follow-up, Pembrolizumab in 3 weeks.    Ned Card ANP/GNP-BC   01/09/2022  11:05 AM

## 2022-01-09 NOTE — Progress Notes (Signed)
Patient seen by [name] NP today  Vitals are within treatment parameters.  Labs reviewed by [name] NP and are within treatment parameters.  Per physician team, patient is ready for treatment and there are NO modifications to the treatment plan.     

## 2022-01-09 NOTE — Patient Instructions (Signed)
[name]   Discharge Instructions: Thank you for choosing [facility] to provide your oncology and hematology care.   If you have a lab appointment with the [facility], please go directly to the [facility] and check in at the registration area.   Wear comfortable clothing and clothing appropriate for easy access to any Portacath or PICC line.   We strive to give you quality time with your provider. You may need to reschedule your appointment if you arrive late (15 or more minutes).  Arriving late affects you and other patients whose appointments are after yours.  Also, if you miss three or more appointments without notifying the office, you may be dismissed from the clinic at the provider's discretion.      For prescription refill requests, have your pharmacy contact our office and allow 72 hours for refills to be completed.    Today you received the following chemotherapy and/or immunotherapy agents Pembrolizumab (KEYTRUDA).      To help prevent nausea and vomiting after your treatment, we encourage you to take your nausea medication as directed.  BELOW ARE SYMPTOMS THAT SHOULD BE REPORTED IMMEDIATELY: *FEVER GREATER THAN 100.4 F (38 C) OR HIGHER *CHILLS OR SWEATING *NAUSEA AND VOMITING THAT IS NOT CONTROLLED WITH YOUR NAUSEA MEDICATION *UNUSUAL SHORTNESS OF BREATH *UNUSUAL BRUISING OR BLEEDING *URINARY PROBLEMS (pain or burning when urinating, or frequent urination) *BOWEL PROBLEMS (unusual diarrhea, constipation, pain near the anus) TENDERNESS IN MOUTH AND THROAT WITH OR WITHOUT PRESENCE OF ULCERS (sore throat, sores in mouth, or a toothache) UNUSUAL RASH, SWELLING OR PAIN  UNUSUAL VAGINAL DISCHARGE OR ITCHING   Items with * indicate a potential emergency and should be followed up as soon as possible or go to the Emergency Department if any problems should occur.  Please show the CHEMOTHERAPY ALERT CARD or IMMUNOTHERAPY ALERT CARD  at check-in to the Emergency Department and triage nurse.  Should you have questions after your visit or need to cancel or reschedule your appointment, please contact [facility]  Dept: [phone]  and follow the prompts.  Office hours are 8:00 a.m. to 4:30 p.m. Monday - Friday. Please note that voicemails left after 4:00 p.m. may not be returned until the following business day.  We are closed weekends and major holidays. You have access to a nurse at all times for urgent questions. Please call the main number to the clinic Dept: [phone] and follow the prompts.   For any non-urgent questions, you may also contact your provider using MyChart. We now offer e-Visits for anyone 32 and older to request care online for non-urgent symptoms. For details visit [email or web address].   Also download the MyChart app! Go to the app store, search "MyChart", open the app, select [facility], and log in with your MyChart username and password.  Due to Covid, a mask is required upon entering the hospital/clinic. If you do not have a mask, one will be given to you upon arrival. For doctor visits, patients may have 1 support person aged 80 or older with them. For treatment visits, patients cannot have anyone with them due to current Covid guidelines and our immunocompromised population.   Pembrolizumab injection What is this medication? PEMBROLIZUMAB (pem broe liz ue mab) is a monoclonal antibody. It is used to treat certain types of cancer. This medicine may be used for other purposes; ask your health care provider or pharmacist if you have questions. COMMON BRAND NAME(S):  Keytruda What should I tell my care team before I take this medication? They need to know if you have any of these conditions: autoimmune diseases like Crohn's disease, ulcerative colitis, or lupus have had or planning to have an allogeneic stem cell transplant (uses someone else's stem cells) history of  organ transplant history of chest radiation nervous system problems like myasthenia gravis or Guillain-Barre syndrome an unusual or allergic reaction to pembrolizumab, other medicines, foods, dyes, or preservatives pregnant or trying to get pregnant breast-feeding How should I use this medication? This medicine is for infusion into a vein. It is given by a health care professional in a hospital or clinic setting. A special MedGuide will be given to you before each treatment. Be sure to read this information carefully each time. Talk to your pediatrician regarding the use of this medicine in children. While this drug may be prescribed for children as young as 6 months for selected conditions, precautions do apply. Overdosage: If you think you have taken too much of this medicine contact a poison control center or emergency room at once. NOTE: This medicine is only for you. Do not share this medicine with others. What if I miss a dose? It is important not to miss your dose. Call your doctor or health care professional if you are unable to keep an appointment. What may interact with this medication? Interactions have not been studied. This list may not describe all possible interactions. Give your health care provider a list of all the medicines, herbs, non-prescription drugs, or dietary supplements you use. Also tell them if you smoke, drink alcohol, or use illegal drugs. Some items may interact with your medicine. What should I watch for while using this medication? Your condition will be monitored carefully while you are receiving this medicine. You may need blood work done while you are taking this medicine. Do not become pregnant while taking this medicine or for 4 months after stopping it. Women should inform their doctor if they wish to become pregnant or think they might be pregnant. There is a potential for serious side effects to an unborn child. Talk to your health care professional or  pharmacist for more information. Do not breast-feed an infant while taking this medicine or for 4 months after the last dose. What side effects may I notice from receiving this medication? Side effects that you should report to your doctor or health care professional as soon as possible: allergic reactions like skin rash, itching or hives, swelling of the face, lips, or tongue bloody or black, tarry breathing problems changes in vision chest pain chills confusion constipation cough diarrhea dizziness or feeling faint or lightheaded fast or irregular heartbeat fever flushing joint pain low blood counts - this medicine may decrease the number of white blood cells, red blood cells and platelets. You may be at increased risk for infections and bleeding. muscle pain muscle weakness pain, tingling, numbness in the hands or feet persistent headache redness, blistering, peeling or loosening of the skin, including inside the mouth signs and symptoms of high blood sugar such as dizziness; dry mouth; dry skin; fruity breath; nausea; stomach pain; increased hunger or thirst; increased urination signs and symptoms of kidney injury like trouble passing urine or change in the amount of urine signs and symptoms of liver injury like dark urine, light-colored stools, loss of appetite, nausea, right upper belly pain, yellowing of the eyes or skin sweating swollen lymph nodes weight loss Side effects that usually do not  require medical attention (report to your doctor or health care professional if they continue or are bothersome): decreased appetite hair loss tiredness This list may not describe all possible side effects. Call your doctor for medical advice about side effects. You may report side effects to FDA at 1-800-FDA-1088. Where should I keep my medication? This drug is given in a hospital or clinic and will not be stored at home. NOTE: This sheet is a summary. It may not cover all possible  information. If you have questions about this medicine, talk to your doctor, pharmacist, or health care provider.  2023 Elsevier/Gold Standard (2021-06-17 00:00:00)

## 2022-01-28 ENCOUNTER — Other Ambulatory Visit: Payer: Self-pay | Admitting: Oncology

## 2022-02-01 ENCOUNTER — Inpatient Hospital Stay: Payer: Medicare Other

## 2022-02-01 ENCOUNTER — Inpatient Hospital Stay: Payer: Medicare Other | Attending: Oncology

## 2022-02-01 ENCOUNTER — Inpatient Hospital Stay (HOSPITAL_BASED_OUTPATIENT_CLINIC_OR_DEPARTMENT_OTHER): Payer: Medicare Other | Admitting: Oncology

## 2022-02-01 ENCOUNTER — Encounter: Payer: Self-pay | Admitting: *Deleted

## 2022-02-01 VITALS — BP 112/58 | HR 58 | Temp 98.1°F | Resp 18 | Ht 65.0 in | Wt 188.4 lb

## 2022-02-01 VITALS — BP 126/59 | HR 47

## 2022-02-01 DIAGNOSIS — C3492 Malignant neoplasm of unspecified part of left bronchus or lung: Secondary | ICD-10-CM

## 2022-02-01 DIAGNOSIS — Z5112 Encounter for antineoplastic immunotherapy: Secondary | ICD-10-CM | POA: Insufficient documentation

## 2022-02-01 DIAGNOSIS — Z79899 Other long term (current) drug therapy: Secondary | ICD-10-CM | POA: Insufficient documentation

## 2022-02-01 DIAGNOSIS — C3432 Malignant neoplasm of lower lobe, left bronchus or lung: Secondary | ICD-10-CM | POA: Diagnosis present

## 2022-02-01 DIAGNOSIS — Z87891 Personal history of nicotine dependence: Secondary | ICD-10-CM | POA: Diagnosis not present

## 2022-02-01 DIAGNOSIS — C7951 Secondary malignant neoplasm of bone: Secondary | ICD-10-CM | POA: Diagnosis present

## 2022-02-01 LAB — CBC WITH DIFFERENTIAL (CANCER CENTER ONLY)
Abs Immature Granulocytes: 0.01 10*3/uL (ref 0.00–0.07)
Basophils Absolute: 0 10*3/uL (ref 0.0–0.1)
Basophils Relative: 0 %
Eosinophils Absolute: 0.1 10*3/uL (ref 0.0–0.5)
Eosinophils Relative: 2 %
HCT: 34.3 % — ABNORMAL LOW (ref 36.0–46.0)
Hemoglobin: 10.9 g/dL — ABNORMAL LOW (ref 12.0–15.0)
Immature Granulocytes: 0 %
Lymphocytes Relative: 25 %
Lymphs Abs: 0.9 10*3/uL (ref 0.7–4.0)
MCH: 30.3 pg (ref 26.0–34.0)
MCHC: 31.8 g/dL (ref 30.0–36.0)
MCV: 95.3 fL (ref 80.0–100.0)
Monocytes Absolute: 0.4 10*3/uL (ref 0.1–1.0)
Monocytes Relative: 10 %
Neutro Abs: 2.2 10*3/uL (ref 1.7–7.7)
Neutrophils Relative %: 63 %
Platelet Count: 142 10*3/uL — ABNORMAL LOW (ref 150–400)
RBC: 3.6 MIL/uL — ABNORMAL LOW (ref 3.87–5.11)
RDW: 14.5 % (ref 11.5–15.5)
WBC Count: 3.6 10*3/uL — ABNORMAL LOW (ref 4.0–10.5)
nRBC: 0 % (ref 0.0–0.2)

## 2022-02-01 LAB — CMP (CANCER CENTER ONLY)
ALT: 17 U/L (ref 0–44)
AST: 18 U/L (ref 15–41)
Albumin: 4.2 g/dL (ref 3.5–5.0)
Alkaline Phosphatase: 49 U/L (ref 38–126)
Anion gap: 9 (ref 5–15)
BUN: 22 mg/dL (ref 8–23)
CO2: 27 mmol/L (ref 22–32)
Calcium: 10 mg/dL (ref 8.9–10.3)
Chloride: 102 mmol/L (ref 98–111)
Creatinine: 1.06 mg/dL — ABNORMAL HIGH (ref 0.44–1.00)
GFR, Estimated: 52 mL/min — ABNORMAL LOW (ref >60)
Glucose, Bld: 106 mg/dL — ABNORMAL HIGH (ref 70–99)
Potassium: 4.3 mmol/L (ref 3.5–5.1)
Sodium: 138 mmol/L (ref 135–145)
Total Bilirubin: 0.4 mg/dL (ref 0.3–1.2)
Total Protein: 6.7 g/dL (ref 6.5–8.1)

## 2022-02-01 MED ORDER — SODIUM CHLORIDE 0.9 % IV SOLN
200.0000 mg | Freq: Once | INTRAVENOUS | Status: AC
  Administered 2022-02-01: 200 mg via INTRAVENOUS
  Filled 2022-02-01: qty 8

## 2022-02-01 MED ORDER — HEPARIN SOD (PORK) LOCK FLUSH 100 UNIT/ML IV SOLN
500.0000 [IU] | Freq: Once | INTRAVENOUS | Status: AC | PRN
  Administered 2022-02-01: 500 [IU]

## 2022-02-01 MED ORDER — SODIUM CHLORIDE 0.9 % IV SOLN: Freq: Once | INTRAVENOUS | Status: AC

## 2022-02-01 MED ORDER — SODIUM CHLORIDE 0.9% FLUSH
10.0000 mL | INTRAVENOUS | Status: DC | PRN
  Administered 2022-02-01: 10 mL

## 2022-02-01 NOTE — Progress Notes (Signed)
Patient seen by Dr. Sherrill today ? ?Vitals are within treatment parameters. ? ?Labs reviewed by Dr. Sherrill and are within treatment parameters. ? ?Per physician team, patient is ready for treatment and there are NO modifications to the treatment plan.  ?

## 2022-02-01 NOTE — Progress Notes (Signed)
[name] OFFICE PROGRESS NOTE   Diagnosis: Non-small cell lung cancer  INTERVAL HISTORY:   Ms. Turri returns as scheduled.  She completed another treatment with pembrolizumab on 01/09/2022.  No rash or diarrhea.  She continues to have improvement in arthritis symptoms.  She complains of hoarseness.  No dysphagia.  The hoarseness has been present over the past month.  Objective:  Vital signs in last 24 hours:  Blood pressure (!) 112/58, pulse (!) 58, temperature 98.1 F (36.7 C), temperature source Oral, resp. rate 18, height 5' 5" (1.651 m), weight 188 lb 6.4 oz (85.5 kg), SpO2 93 %.    Lymphatics: No cervical or supraclavicular nodes Resp: Lungs clear bilaterally Cardio: Regular rate and rhythm GI: Nontender, no hepatosplenomegaly Vascular: No leg edema  Portacath/PICC-without erythema  Lab Results:  Lab Results  Component Value Date   WBC 3.6 (L) 02/01/2022   HGB 10.9 (L) 02/01/2022   HCT 34.3 (L) 02/01/2022   MCV 95.3 02/01/2022   PLT 142 (L) 02/01/2022   NEUTROABS 2.2 02/01/2022    CMP  Lab Results  Component Value Date   NA 137 01/09/2022   K 4.1 01/09/2022   CL 100 01/09/2022   CO2 26 01/09/2022   GLUCOSE 112 (H) 01/09/2022   BUN 24 (H) 01/09/2022   CREATININE 0.88 01/09/2022   CALCIUM 10.1 01/09/2022   PROT 6.8 01/09/2022   ALBUMIN 4.3 01/09/2022   AST 17 01/09/2022   ALT 17 01/09/2022   ALKPHOS 52 01/09/2022   BILITOT 0.5 01/09/2022   GFRNONAA >60 01/09/2022   GFRAA >60 04/21/2020     Medications: I have reviewed the patient's current medications.   Assessment/Plan: Non-small cell lung cancer MRI lumbar spine 04/29/2019- enlarging marrow lesions involving the L1 vertebral body, upper left sacrum and right iliac bone MRI pelvis 04/29/2019- 3.5 cm left iliac bone lesion appears slightly larger; other similar appearing lesions present within the left superior pubic ramus, left superior abdomen acetabulum and upper left  sacrum Kappa free light chains with mild elevation 05/12/2019  CTs 05/12/2019- left lower lobe pulmonary mass 3.3 x 3.2 cm; lytic process left iliac bone; spinal lesions; 1.1 cm low-density left kidney lesion; right thyroid enlargement with heterogeneous appearance with potential for multiple discrete lesions Biopsy left lower lobe lung mass 05/26/2019-poorly differentiated carcinoma; positive for cytokeratin 5/6, p63 and TTF-1, no EGFR, BRAF, ALK, ERBB2,ROS, or NTRK alteration Cycle 1 carboplatin/Alimta/pembrolizumab 06/06/2019 Cycle 2 carboplatin/Alimta/pembrolizumab 06/27/2019 Cycle 3 carboplatin/Alimta/pembrolizumab 07/18/2019 Cycle 4 carboplatin/Alimta/pembrolizumab 08/07/2019 CTs 08/27/2019-significant decrease in size of lobulated mass left lower lobe.  Unchanged appearance of subtle bone lesions. Cycle 5 Alimta/pembrolizumab 08/28/2019 Cycle 6 Alimta/pembrolizumab 09/18/2019 Cycle 7 Alimta/pembrolizumab 10/09/2019 Cycle 8 Alimta/pembrolizumab 10/30/2019 Cycle 9 Alimta/pembrolizumab 11/20/2019 CTs 12/09/2019-no evidence of disease progression, left lower lobe nodule slightly decreased in size, stable L1, left sacral, and left pubic ramus metastases Cycle 10 Alimta/pembrolizumab 12/11/2019 Cycle 11 pembrolizumab alone 01/02/2020 (Alimta held due to edema, tenderness, erythema at the lower legs) Cycle 12 pembrolizumab 01/23/2020 Cycle 13 pembrolizumab 02/12/2020 Cycle 14 pembrolizumab 03/04/2020 CTs 03/18/2020-stable left lower lobe lesion, mild sclerosis at the superior endplate of L1 that was previously hypermetabolic, stable small left upper sacral lucent lesion, previous left superior pubic ramus lesion is occult on the CT, CT head negative for malignancy Cycle 15 pembrolizumab 03/25/2020 Cycle 16 pembrolizumab 04/21/2020 Cycle 17 pembrolizumab 05/17/2020 05/19/2020 bone scan-no definite abnormalities to suggest osseous metastases.  Areas of concern on prior PET-CT involving left iliac bone and L1  vertebral body  showed no abnormalities on the current study Cycle 18 Pembrolizumab 06/10/2020 Cycle 19 Pembrolizumab 06/30/2020 CTs 07/16/2020-stable left lower lobe nodule, stable faint superior L1 vertebral lesion, no evidence of disease progression Cycle 20 pembrolizumab 07/21/2020 Cycle 21 Pembrolizumab 08/11/2020 Cycle 22 pembrolizumab 09/01/2020 Cycle 23 Pembrolizumab 09/22/2020 Cycle 24 pembrolizumab 10/13/2020 Cycle 25 Pembrolizumab 11/03/2020 Cycle 26 pembrolizumab 11/26/2020 CTs 12/15/2020- stable left lower lobe mass, stable sclerotic lesion at L2, no evidence of disease progression Cycle 27 pembrolizumab 12/17/2020 PET scan 01/03/2021-1.8 x 1.3 cm left lower lobe nodule similar in size to CT of 12/15/2020 and measures smaller than previous PET/CT from 2020.  Nodule is markedly hypermetabolic.  No evidence for hypermetabolic hilar or mediastinal lymphadenopathy.  Several tiny foci of hypermetabolism identified in bony anatomy raising concern for skeletal metastases.  Comparison of the PET to CT 07/16/2020-lobular left lower lobe pulmonary nodule measured 2.4 x 1.5 cm on the prior study, current study it measured 1.8 x 1.3 cm.  Lesion in the anterior left acetabulum is similar to the 07/16/2020 exam although overlying cortical thinning slightly more pronounced on the current study.  Described lesion in the scapula shows some cortical sclerosis and a tiny central marrow lucency not substantially changed compared to 07/16/2020.  Left third rib lesion shows heterogeneous mineralization similar to 07/16/2020. MRI of cervical spine 01/13/2021-increased left facet edema at C5-C6, severe facet arthrosis on the left at C7-T1 and on the right at C3-C4, no evidence of metastatic disease Cycle 28 Pembrolizumab 01/14/2021 Cycle 29 Pembrolizumab 02/03/2021 Cycle 30 Pembrolizumab 02/24/2021 MRI left hip 03/02/2021-compared to 04/29/2019, slight increase in size of metastases at the left iliac and left acetabulum.  Lesion at  the left upper sacrum is less conspicuous and a lesion at the left superior pubic ramus is stable Cycle 31 pembrolizumab 03/17/2021 Radiation to left acetabulum and left iliac 03/30/2021-04/13/2021 Cycle 32 pembrolizumab 04/07/2021 Cycle 33 Pembrolizumab 04/28/2021 Cycle 34 pembrolizumab 05/18/2021 PET scan 06/01/2021-no significant change in size or degree of FDG uptake associated with FDG avid left lower lobe lung nodule compatible with neoplasm.  Stable to improved appearance of multifocal FDG avid bone metastasis. Cycle 35 pembrolizumab 06/10/2021 Cycle 36 pembrolizumab 07/01/2021 Cycle 37 pembrolizumab 07/29/2021 Cycle 38 pembrolizumab 08/19/2021 Cycle 39 Pembrolizumab 09/09/2021 Cycle 40 pembrolizumab 09/30/2021 CT chest 10/20/2021 to evaluate complaints of chest pain-no PE.  Interval increase in left lower lobe mass. Cycle 41 Pembrolizumab 10/21/2021 Cycle 42 pembrolizumab 11/11/2021 PET 11/24/2021-mild increase in size of left lower lobe mass, no evidence of solid organ or nodal metastases, stable multifocal bone metastases.  No new sites of metastatic disease. Cycle 43 pembrolizumab 11/29/2021 Cycle 45 Pembrolizumab 12/20/2021 Cycle 46 Pembrolizumab 01/09/2022 Cycle 47 pembrolizumab 02/01/2022 Pain secondary to #1, improved Chronic back pain Type 2 diabetes Essential hypertension CAD Hyperlipidemia Family history significant for multiple members with breast cancer Grade 1 skin rash 07/18/2019 likely related to immunotherapy.  Topical steroid cream as needed. E. coli urinary tract infection 07/14/2019.  Completed cephalexin. Edema/tenderness at the right greater than left ankle 10/21/2019-etiology unclear, potentially related to systemic therapy or an infection, doxycycline prescribed-improved 10/23/2019; marked improvement 11/20/2019; at office visit 01/02/2020 she reports worsening of lower extremity edema, pain/tenderness, erythema 3 to 4 days following each treatment.  Alimta held 01/02/2020.  Referral  to dermatology. COVID-19 infection 03/30/2020, monoclonal antibody therapy 04/06/2020 Arthritis, possibly related to Pembrolizumab, improved since beginning Medrol, now followed by Dr. Amil Amen    Disposition: [name] appears unchanged.  She will complete another cycle of pembrolizumab today.  There is  no clinical evidence of disease progression.  She appears to have mild intermittent hoarseness.  I doubt this is related to laryngeal nerve paralysis.  She does not have a history of mediastinal lymphadenopathy.  We will investigate further if the hoarseness progresses.  She will return for an office visit and pembrolizumab in 3 weeks.  [name], MD  02/01/2022  9:01 AM

## 2022-02-01 NOTE — Patient Instructions (Signed)
[facility]   Discharge Instructions: Thank you for choosing [facility] to provide your oncology and hematology care.   If you have a lab appointment with the [facility], please go directly to the [facility] and check in at the registration area.   Wear comfortable clothing and clothing appropriate for easy access to any Portacath or PICC line.   We strive to give you quality time with your provider. You may need to reschedule your appointment if you arrive late (15 or more minutes).  Arriving late affects you and other patients whose appointments are after yours.  Also, if you miss three or more appointments without notifying the office, you may be dismissed from the clinic at the provider's discretion.      For prescription refill requests, have your pharmacy contact our office and allow 72 hours for refills to be completed.    Today you received the following chemotherapy and/or immunotherapy agents Keytruda      To help prevent nausea and vomiting after your treatment, we encourage you to take your nausea medication as directed.  BELOW ARE SYMPTOMS THAT SHOULD BE REPORTED IMMEDIATELY: *FEVER GREATER THAN 100.4 F (38 C) OR HIGHER *CHILLS OR SWEATING *NAUSEA AND VOMITING THAT IS NOT CONTROLLED WITH YOUR NAUSEA MEDICATION *UNUSUAL SHORTNESS OF BREATH *UNUSUAL BRUISING OR BLEEDING *URINARY PROBLEMS (pain or burning when urinating, or frequent urination) *BOWEL PROBLEMS (unusual diarrhea, constipation, pain near the anus) TENDERNESS IN MOUTH AND THROAT WITH OR WITHOUT PRESENCE OF ULCERS (sore throat, sores in mouth, or a toothache) UNUSUAL RASH, SWELLING OR PAIN  UNUSUAL VAGINAL DISCHARGE OR ITCHING   Items with * indicate a potential emergency and should be followed up as soon as possible or go to the Emergency Department if any problems should occur.  Please show the CHEMOTHERAPY ALERT CARD or IMMUNOTHERAPY ALERT CARD at check-in to the  Emergency Department and triage nurse.  Should you have questions after your visit or need to cancel or reschedule your appointment, please contact [facility]  Dept: [phone]  and follow the prompts.  Office hours are 8:00 a.m. to 4:30 p.m. Monday - Friday. Please note that voicemails left after 4:00 p.m. may not be returned until the following business day.  We are closed weekends and major holidays. You have access to a nurse at all times for urgent questions. Please call the main number to the clinic Dept: [phone] and follow the prompts.   For any non-urgent questions, you may also contact your provider using MyChart. We now offer e-Visits for anyone 27 and older to request care online for non-urgent symptoms. For details visit [email or web address].   Also download the MyChart app! Go to the app store, search "MyChart", open the app, select , and log in with your MyChart username and password.  Masks are optional in the cancer centers. If you would like for your care team to wear a mask while they are taking care of you, please let them know. For doctor visits, patients may have with them one support person who is at least 85 years old. At this time, visitors are not allowed in the infusion area.  Pembrolizumab injection What is this medication? PEMBROLIZUMAB (pem broe liz ue mab) is a monoclonal antibody. It is used to treat certain types of cancer. This medicine may be used for other purposes; ask your health care provider or pharmacist if you have questions. COMMON BRAND NAME(S): Keytruda What  should I tell my care team before I take this medication? They need to know if you have any of these conditions: autoimmune diseases like Crohn's disease, ulcerative colitis, or lupus have had or planning to have an allogeneic stem cell transplant (uses someone else's stem cells) history of organ transplant history of chest radiation nervous system  problems like myasthenia gravis or Guillain-Barre syndrome an unusual or allergic reaction to pembrolizumab, other medicines, foods, dyes, or preservatives pregnant or trying to get pregnant breast-feeding How should I use this medication? This medicine is for infusion into a vein. It is given by a health care professional in a hospital or clinic setting. A special MedGuide will be given to you before each treatment. Be sure to read this information carefully each time. Talk to your pediatrician regarding the use of this medicine in children. While this drug may be prescribed for children as young as 6 months for selected conditions, precautions do apply. Overdosage: If you think you have taken too much of this medicine contact a poison control center or emergency room at once. NOTE: This medicine is only for you. Do not share this medicine with others. What if I miss a dose? It is important not to miss your dose. Call your doctor or health care professional if you are unable to keep an appointment. What may interact with this medication? Interactions have not been studied. This list may not describe all possible interactions. Give your health care provider a list of all the medicines, herbs, non-prescription drugs, or dietary supplements you use. Also tell them if you smoke, drink alcohol, or use illegal drugs. Some items may interact with your medicine. What should I watch for while using this medication? Your condition will be monitored carefully while you are receiving this medicine. You may need blood work done while you are taking this medicine. Do not become pregnant while taking this medicine or for 4 months after stopping it. Women should inform their doctor if they wish to become pregnant or think they might be pregnant. There is a potential for serious side effects to an unborn child. Talk to your health care professional or pharmacist for more information. Do not breast-feed an infant  while taking this medicine or for 4 months after the last dose. What side effects may I notice from receiving this medication? Side effects that you should report to your doctor or health care professional as soon as possible: allergic reactions like skin rash, itching or hives, swelling of the face, lips, or tongue bloody or black, tarry breathing problems changes in vision chest pain chills confusion constipation cough diarrhea dizziness or feeling faint or lightheaded fast or irregular heartbeat fever flushing joint pain low blood counts - this medicine may decrease the number of white blood cells, red blood cells and platelets. You may be at increased risk for infections and bleeding. muscle pain muscle weakness pain, tingling, numbness in the hands or feet persistent headache redness, blistering, peeling or loosening of the skin, including inside the mouth signs and symptoms of high blood sugar such as dizziness; dry mouth; dry skin; fruity breath; nausea; stomach pain; increased hunger or thirst; increased urination signs and symptoms of kidney injury like trouble passing urine or change in the amount of urine signs and symptoms of liver injury like dark urine, light-colored stools, loss of appetite, nausea, right upper belly pain, yellowing of the eyes or skin sweating swollen lymph nodes weight loss Side effects that usually do not require medical  attention (report to your doctor or health care professional if they continue or are bothersome): decreased appetite hair loss tiredness This list may not describe all possible side effects. Call your doctor for medical advice about side effects. You may report side effects to FDA at 1-800-FDA-1088. Where should I keep my medication? This drug is given in a hospital or clinic and will not be stored at home. NOTE: This sheet is a summary. It may not cover all possible information. If you have questions about this medicine, talk  to your doctor, pharmacist, or health care provider.  2023 Elsevier/Gold Standard (2021-06-17 00:00:00)

## 2022-02-19 ENCOUNTER — Other Ambulatory Visit: Payer: Self-pay | Admitting: Oncology

## 2022-02-20 ENCOUNTER — Other Ambulatory Visit: Payer: Self-pay

## 2022-02-24 ENCOUNTER — Inpatient Hospital Stay: Payer: Medicare Other

## 2022-02-24 ENCOUNTER — Encounter: Payer: Self-pay | Admitting: *Deleted

## 2022-02-24 ENCOUNTER — Inpatient Hospital Stay (HOSPITAL_BASED_OUTPATIENT_CLINIC_OR_DEPARTMENT_OTHER): Payer: Medicare Other | Admitting: Oncology

## 2022-02-24 VITALS — BP 126/61 | HR 58

## 2022-02-24 VITALS — BP 131/66 | HR 63 | Temp 98.2°F | Resp 18 | Ht 65.0 in | Wt 185.8 lb

## 2022-02-24 DIAGNOSIS — C3492 Malignant neoplasm of unspecified part of left bronchus or lung: Secondary | ICD-10-CM

## 2022-02-24 DIAGNOSIS — Z5112 Encounter for antineoplastic immunotherapy: Secondary | ICD-10-CM | POA: Diagnosis not present

## 2022-02-24 LAB — CMP (CANCER CENTER ONLY)
ALT: 17 U/L (ref 0–44)
AST: 19 U/L (ref 15–41)
Albumin: 4.5 g/dL (ref 3.5–5.0)
Alkaline Phosphatase: 46 U/L (ref 38–126)
Anion gap: 9 (ref 5–15)
BUN: 25 mg/dL — ABNORMAL HIGH (ref 8–23)
CO2: 26 mmol/L (ref 22–32)
Calcium: 9.9 mg/dL (ref 8.9–10.3)
Chloride: 103 mmol/L (ref 98–111)
Creatinine: 0.9 mg/dL (ref 0.44–1.00)
GFR, Estimated: >60 mL/min (ref >60)
Glucose, Bld: 102 mg/dL — ABNORMAL HIGH (ref 70–99)
Potassium: 4.1 mmol/L (ref 3.5–5.1)
Sodium: 138 mmol/L (ref 135–145)
Total Bilirubin: 0.4 mg/dL (ref 0.3–1.2)
Total Protein: 6.5 g/dL (ref 6.5–8.1)

## 2022-02-24 LAB — CBC WITH DIFFERENTIAL (CANCER CENTER ONLY)
Abs Immature Granulocytes: 0.02 10*3/uL (ref 0.00–0.07)
Basophils Absolute: 0 10*3/uL (ref 0.0–0.1)
Basophils Relative: 0 %
Eosinophils Absolute: 0 10*3/uL (ref 0.0–0.5)
Eosinophils Relative: 0 %
HCT: 34.3 % — ABNORMAL LOW (ref 36.0–46.0)
Hemoglobin: 11.1 g/dL — ABNORMAL LOW (ref 12.0–15.0)
Immature Granulocytes: 0 %
Lymphocytes Relative: 12 %
Lymphs Abs: 0.6 10*3/uL — ABNORMAL LOW (ref 0.7–4.0)
MCH: 30.4 pg (ref 26.0–34.0)
MCHC: 32.4 g/dL (ref 30.0–36.0)
MCV: 94 fL (ref 80.0–100.0)
Monocytes Absolute: 0.5 10*3/uL (ref 0.1–1.0)
Monocytes Relative: 9 %
Neutro Abs: 4.2 10*3/uL (ref 1.7–7.7)
Neutrophils Relative %: 79 %
Platelet Count: 149 10*3/uL — ABNORMAL LOW (ref 150–400)
RBC: 3.65 MIL/uL — ABNORMAL LOW (ref 3.87–5.11)
RDW: 13.9 % (ref 11.5–15.5)
WBC Count: 5.3 10*3/uL (ref 4.0–10.5)
nRBC: 0 % (ref 0.0–0.2)

## 2022-02-24 LAB — TSH: TSH: 0.597 u[IU]/mL (ref 0.350–4.500)

## 2022-02-24 MED ORDER — SODIUM CHLORIDE 0.9 % IV SOLN
200.0000 mg | Freq: Once | INTRAVENOUS | Status: AC
  Administered 2022-02-24: 200 mg via INTRAVENOUS
  Filled 2022-02-24: qty 8

## 2022-02-24 MED ORDER — SODIUM CHLORIDE 0.9 % IV SOLN: Freq: Once | INTRAVENOUS | Status: AC

## 2022-02-24 MED ORDER — SODIUM CHLORIDE 0.9% FLUSH
10.0000 mL | INTRAVENOUS | Status: DC | PRN
  Administered 2022-02-24: 10 mL

## 2022-02-24 MED ORDER — HEPARIN SOD (PORK) LOCK FLUSH 100 UNIT/ML IV SOLN
500.0000 [IU] | Freq: Once | INTRAVENOUS | Status: AC | PRN
  Administered 2022-02-24: 500 [IU]

## 2022-02-24 NOTE — Progress Notes (Addendum)
Patient seen by Dr. Sherrill today ? ?Vitals are within treatment parameters. ? ?Labs reviewed by Dr. Sherrill and are within treatment parameters. ? ?Per physician team, patient is ready for treatment and there are NO modifications to the treatment plan.  ?

## 2022-02-24 NOTE — Progress Notes (Signed)
[facility] OFFICE PROGRESS NOTE   Diagnosis: Non-small cell lung cancer  INTERVAL HISTORY:   [name] completed another cycle of pembrolizumab on 02/01/2022.  No rash or diarrhea.  She continues to have diffuse bone discomfort, chiefly at the upper back/shoulders and hips.  She continues Medrol.  She is not using hydrocodone.  Good appetite.  Objective:  Vital signs in last 24 hours:  Blood pressure 131/66, pulse 63, temperature 98.2 F (36.8 C), temperature source Oral, resp. rate 18, height '5\' 5"'  (1.651 m), weight 185 lb 12.8 oz (84.3 kg), SpO2 98 %.   Resp: Lungs clear bilaterally Cardio: Regular rate and rhythm GI: No hepatosplenomegaly Vascular: No leg edema Musculoskeletal: Discomfort with motion at the bilateral hip  Portacath/PICC-without erythema  Lab Results:  Lab Results  Component Value Date   WBC 5.3 02/24/2022   HGB 11.1 (L) 02/24/2022   HCT 34.3 (L) 02/24/2022   MCV 94.0 02/24/2022   PLT 149 (L) 02/24/2022   NEUTROABS 4.2 02/24/2022    CMP  Lab Results  Component Value Date   NA 138 02/24/2022   K 4.1 02/24/2022   CL 103 02/24/2022   CO2 26 02/24/2022   GLUCOSE 102 (H) 02/24/2022   BUN 25 (H) 02/24/2022   CREATININE 0.90 02/24/2022   CALCIUM 9.9 02/24/2022   PROT 6.5 02/24/2022   ALBUMIN 4.5 02/24/2022   AST 19 02/24/2022   ALT 17 02/24/2022   ALKPHOS 46 02/24/2022   BILITOT 0.4 02/24/2022   GFRNONAA >60 02/24/2022   GFRAA >60 04/21/2020     Medications: I have reviewed the patient's current medications.   Assessment/Plan: Non-small cell lung cancer MRI lumbar spine 04/29/2019- enlarging marrow lesions involving the L1 vertebral body, upper left sacrum and right iliac bone MRI pelvis 04/29/2019- 3.5 cm left iliac bone lesion appears slightly larger; other similar appearing lesions present within the left superior pubic ramus, left superior abdomen acetabulum and upper left sacrum Kappa free light chains with mild  elevation 05/12/2019  CTs 05/12/2019- left lower lobe pulmonary mass 3.3 x 3.2 cm; lytic process left iliac bone; spinal lesions; 1.1 cm low-density left kidney lesion; right thyroid enlargement with heterogeneous appearance with potential for multiple discrete lesions Biopsy left lower lobe lung mass 05/26/2019-poorly differentiated carcinoma; positive for cytokeratin 5/6, p63 and TTF-1, no EGFR, BRAF, ALK, ERBB2,ROS, or NTRK alteration Cycle 1 carboplatin/Alimta/pembrolizumab 06/06/2019 Cycle 2 carboplatin/Alimta/pembrolizumab 06/27/2019 Cycle 3 carboplatin/Alimta/pembrolizumab 07/18/2019 Cycle 4 carboplatin/Alimta/pembrolizumab 08/07/2019 CTs 08/27/2019-significant decrease in size of lobulated mass left lower lobe.  Unchanged appearance of subtle bone lesions. Cycle 5 Alimta/pembrolizumab 08/28/2019 Cycle 6 Alimta/pembrolizumab 09/18/2019 Cycle 7 Alimta/pembrolizumab 10/09/2019 Cycle 8 Alimta/pembrolizumab 10/30/2019 Cycle 9 Alimta/pembrolizumab 11/20/2019 CTs 12/09/2019-no evidence of disease progression, left lower lobe nodule slightly decreased in size, stable L1, left sacral, and left pubic ramus metastases Cycle 10 Alimta/pembrolizumab 12/11/2019 Cycle 11 pembrolizumab alone 01/02/2020 (Alimta held due to edema, tenderness, erythema at the lower legs) Cycle 12 pembrolizumab 01/23/2020 Cycle 13 pembrolizumab 02/12/2020 Cycle 14 pembrolizumab 03/04/2020 CTs 03/18/2020-stable left lower lobe lesion, mild sclerosis at the superior endplate of L1 that was previously hypermetabolic, stable small left upper sacral lucent lesion, previous left superior pubic ramus lesion is occult on the CT, CT head negative for malignancy Cycle 15 pembrolizumab 03/25/2020 Cycle 16 pembrolizumab 04/21/2020 Cycle 17 pembrolizumab 05/17/2020 05/19/2020 bone scan-no definite abnormalities to suggest osseous metastases.  Areas of concern on prior PET-CT involving left iliac bone and L1 vertebral body showed no abnormalities on the  current study  Cycle 18 Pembrolizumab 06/10/2020 Cycle 19 Pembrolizumab 06/30/2020 CTs 07/16/2020-stable left lower lobe nodule, stable faint superior L1 vertebral lesion, no evidence of disease progression Cycle 20 pembrolizumab 07/21/2020 Cycle 21 Pembrolizumab 08/11/2020 Cycle 22 pembrolizumab 09/01/2020 Cycle 23 Pembrolizumab 09/22/2020 Cycle 24 pembrolizumab 10/13/2020 Cycle 25 Pembrolizumab 11/03/2020 Cycle 26 pembrolizumab 11/26/2020 CTs 12/15/2020- stable left lower lobe mass, stable sclerotic lesion at L2, no evidence of disease progression Cycle 27 pembrolizumab 12/17/2020 PET scan 01/03/2021-1.8 x 1.3 cm left lower lobe nodule similar in size to CT of 12/15/2020 and measures smaller than previous PET/CT from 2020.  Nodule is markedly hypermetabolic.  No evidence for hypermetabolic hilar or mediastinal lymphadenopathy.  Several tiny foci of hypermetabolism identified in bony anatomy raising concern for skeletal metastases.  Comparison of the PET to CT 07/16/2020-lobular left lower lobe pulmonary nodule measured 2.4 x 1.5 cm on the prior study, current study it measured 1.8 x 1.3 cm.  Lesion in the anterior left acetabulum is similar to the 07/16/2020 exam although overlying cortical thinning slightly more pronounced on the current study.  Described lesion in the scapula shows some cortical sclerosis and a tiny central marrow lucency not substantially changed compared to 07/16/2020.  Left third rib lesion shows heterogeneous mineralization similar to 07/16/2020. MRI of cervical spine 01/13/2021-increased left facet edema at C5-C6, severe facet arthrosis on the left at C7-T1 and on the right at C3-C4, no evidence of metastatic disease Cycle 28 Pembrolizumab 01/14/2021 Cycle 29 Pembrolizumab 02/03/2021 Cycle 30 Pembrolizumab 02/24/2021 MRI left hip 03/02/2021-compared to 04/29/2019, slight increase in size of metastases at the left iliac and left acetabulum.  Lesion at the left upper sacrum is less conspicuous and  a lesion at the left superior pubic ramus is stable Cycle 31 pembrolizumab 03/17/2021 Radiation to left acetabulum and left iliac 03/30/2021-04/13/2021 Cycle 32 pembrolizumab 04/07/2021 Cycle 33 Pembrolizumab 04/28/2021 Cycle 34 pembrolizumab 05/18/2021 PET scan 06/01/2021-no significant change in size or degree of FDG uptake associated with FDG avid left lower lobe lung nodule compatible with neoplasm.  Stable to improved appearance of multifocal FDG avid bone metastasis. Cycle 35 pembrolizumab 06/10/2021 Cycle 36 pembrolizumab 07/01/2021 Cycle 37 pembrolizumab 07/29/2021 Cycle 38 pembrolizumab 08/19/2021 Cycle 39 Pembrolizumab 09/09/2021 Cycle 40 pembrolizumab 09/30/2021 CT chest 10/20/2021 to evaluate complaints of chest pain-no PE.  Interval increase in left lower lobe mass. Cycle 41 Pembrolizumab 10/21/2021 Cycle 42 pembrolizumab 11/11/2021 PET 11/24/2021-mild increase in size of left lower lobe mass, no evidence of solid organ or nodal metastases, stable multifocal bone metastases.  No new sites of metastatic disease. Cycle 43 pembrolizumab 11/29/2021 Cycle 45 Pembrolizumab 12/20/2021 Cycle 46 Pembrolizumab 01/09/2022 Cycle 47 pembrolizumab 02/01/2022 Pain secondary to #1, improved Chronic back pain Type 2 diabetes Essential hypertension CAD Hyperlipidemia Family history significant for multiple members with breast cancer Grade 1 skin rash 07/18/2019 likely related to immunotherapy.  Topical steroid cream as needed. E. coli urinary tract infection 07/14/2019.  Completed cephalexin. Edema/tenderness at the right greater than left ankle 10/21/2019-etiology unclear, potentially related to systemic therapy or an infection, doxycycline prescribed-improved 10/23/2019; marked improvement 11/20/2019; at office visit 01/02/2020 she reports worsening of lower extremity edema, pain/tenderness, erythema 3 to 4 days following each treatment.  Alimta held 01/02/2020.  Referral to dermatology. COVID-19 infection 03/30/2020,  monoclonal antibody therapy 04/06/2020 Arthritis, possibly related to Pembrolizumab, improved since beginning Medrol, now followed by Dr. Amil Amen    Disposition: Ms. Rick appears stable.  I suspect the bone pain is related to rheumatoid arthritis.  She will continue Medrol.  She will  complete another treatment with pembrolizumab today.  I have a low clinical suspicion for progression of the non-small cell lung cancer.  She will return for an office visit and pembrolizumab in 3 weeks.  [name], MD  02/24/2022  12:37 PM

## 2022-02-24 NOTE — Patient Instructions (Signed)

## 2022-02-24 NOTE — Patient Instructions (Signed)
[facility]   Discharge Instructions: Thank you for choosing [facility] to provide your oncology and hematology care.   If you have a lab appointment with the [facility], please go directly to the [facility] and check in at the registration area.   Wear comfortable clothing and clothing appropriate for easy access to any Portacath or PICC line.   We strive to give you quality time with your provider. You may need to reschedule your appointment if you arrive late (15 or more minutes).  Arriving late affects you and other patients whose appointments are after yours.  Also, if you miss three or more appointments without notifying the office, you may be dismissed from the clinic at the provider's discretion.      For prescription refill requests, have your pharmacy contact our office and allow 72 hours for refills to be completed.    Today you received the following chemotherapy and/or immunotherapy agents Keytruda      To help prevent nausea and vomiting after your treatment, we encourage you to take your nausea medication as directed.  BELOW ARE SYMPTOMS THAT SHOULD BE REPORTED IMMEDIATELY: *FEVER GREATER THAN 100.4 F (38 C) OR HIGHER *CHILLS OR SWEATING *NAUSEA AND VOMITING THAT IS NOT CONTROLLED WITH YOUR NAUSEA MEDICATION *UNUSUAL SHORTNESS OF BREATH *UNUSUAL BRUISING OR BLEEDING *URINARY PROBLEMS (pain or burning when urinating, or frequent urination) *BOWEL PROBLEMS (unusual diarrhea, constipation, pain near the anus) TENDERNESS IN MOUTH AND THROAT WITH OR WITHOUT PRESENCE OF ULCERS (sore throat, sores in mouth, or a toothache) UNUSUAL RASH, SWELLING OR PAIN  UNUSUAL VAGINAL DISCHARGE OR ITCHING   Items with * indicate a potential emergency and should be followed up as soon as possible or go to the Emergency Department if any problems should occur.  Please show the CHEMOTHERAPY ALERT CARD or IMMUNOTHERAPY ALERT CARD at check-in to the  Emergency Department and triage nurse.  Should you have questions after your visit or need to cancel or reschedule your appointment, please contact [facility]  Dept: [phone]  and follow the prompts.  Office hours are 8:00 a.m. to 4:30 p.m. Monday - Friday. Please note that voicemails left after 4:00 p.m. may not be returned until the following business day.  We are closed weekends and major holidays. You have access to a nurse at all times for urgent questions. Please call the main number to the clinic Dept: [phone] and follow the prompts.   For any non-urgent questions, you may also contact your provider using MyChart. We now offer e-Visits for anyone 69 and older to request care online for non-urgent symptoms. For details visit [email or web address].   Also download the MyChart app! Go to the app store, search "MyChart", open the app, select [facility], and log in with your MyChart username and password.  Masks are optional in the cancer centers. If you would like for your care team to wear a mask while they are taking care of you, please let them know. For doctor visits, patients may have with them one support person who is at least 85 years old. At this time, visitors are not allowed in the infusion area.  Pembrolizumab injection What is this medication? PEMBROLIZUMAB (pem broe liz ue mab) is a monoclonal antibody. It is used to treat certain types of cancer. This medicine may be used for other purposes; ask your health care provider or pharmacist if you have questions. COMMON BRAND NAME(S): Keytruda What  should I tell my care team before I take this medication? They need to know if you have any of these conditions: autoimmune diseases like Crohn's disease, ulcerative colitis, or lupus have had or planning to have an allogeneic stem cell transplant (uses someone else's stem cells) history of organ transplant history of chest radiation nervous system  problems like myasthenia gravis or Guillain-Barre syndrome an unusual or allergic reaction to pembrolizumab, other medicines, foods, dyes, or preservatives pregnant or trying to get pregnant breast-feeding How should I use this medication? This medicine is for infusion into a vein. It is given by a health care professional in a hospital or clinic setting. A special MedGuide will be given to you before each treatment. Be sure to read this information carefully each time. Talk to your pediatrician regarding the use of this medicine in children. While this drug may be prescribed for children as young as 6 months for selected conditions, precautions do apply. Overdosage: If you think you have taken too much of this medicine contact a poison control center or emergency room at once. NOTE: This medicine is only for you. Do not share this medicine with others. What if I miss a dose? It is important not to miss your dose. Call your doctor or health care professional if you are unable to keep an appointment. What may interact with this medication? Interactions have not been studied. This list may not describe all possible interactions. Give your health care provider a list of all the medicines, herbs, non-prescription drugs, or dietary supplements you use. Also tell them if you smoke, drink alcohol, or use illegal drugs. Some items may interact with your medicine. What should I watch for while using this medication? Your condition will be monitored carefully while you are receiving this medicine. You may need blood work done while you are taking this medicine. Do not become pregnant while taking this medicine or for 4 months after stopping it. Women should inform their doctor if they wish to become pregnant or think they might be pregnant. There is a potential for serious side effects to an unborn child. Talk to your health care professional or pharmacist for more information. Do not breast-feed an infant  while taking this medicine or for 4 months after the last dose. What side effects may I notice from receiving this medication? Side effects that you should report to your doctor or health care professional as soon as possible: allergic reactions like skin rash, itching or hives, swelling of the face, lips, or tongue bloody or black, tarry breathing problems changes in vision chest pain chills confusion constipation cough diarrhea dizziness or feeling faint or lightheaded fast or irregular heartbeat fever flushing joint pain low blood counts - this medicine may decrease the number of white blood cells, red blood cells and platelets. You may be at increased risk for infections and bleeding. muscle pain muscle weakness pain, tingling, numbness in the hands or feet persistent headache redness, blistering, peeling or loosening of the skin, including inside the mouth signs and symptoms of high blood sugar such as dizziness; dry mouth; dry skin; fruity breath; nausea; stomach pain; increased hunger or thirst; increased urination signs and symptoms of kidney injury like trouble passing urine or change in the amount of urine signs and symptoms of liver injury like dark urine, light-colored stools, loss of appetite, nausea, right upper belly pain, yellowing of the eyes or skin sweating swollen lymph nodes weight loss Side effects that usually do not require medical  attention (report to your doctor or health care professional if they continue or are bothersome): decreased appetite hair loss tiredness This list may not describe all possible side effects. Call your doctor for medical advice about side effects. You may report side effects to FDA at 1-800-FDA-1088. Where should I keep my medication? This drug is given in a hospital or clinic and will not be stored at home. NOTE: This sheet is a summary. It may not cover all possible information. If you have questions about this medicine, talk  to your doctor, pharmacist, or health care provider.  2023 Elsevier/Gold Standard (2021-06-17 00:00:00)

## 2022-03-12 ENCOUNTER — Other Ambulatory Visit: Payer: Self-pay | Admitting: Oncology

## 2022-03-14 ENCOUNTER — Other Ambulatory Visit: Payer: Self-pay | Admitting: Oncology

## 2022-03-17 ENCOUNTER — Encounter: Payer: Self-pay | Admitting: Nurse Practitioner

## 2022-03-17 ENCOUNTER — Inpatient Hospital Stay (HOSPITAL_BASED_OUTPATIENT_CLINIC_OR_DEPARTMENT_OTHER): Payer: Medicare Other | Admitting: Nurse Practitioner

## 2022-03-17 ENCOUNTER — Inpatient Hospital Stay: Payer: Medicare Other | Attending: Oncology

## 2022-03-17 ENCOUNTER — Inpatient Hospital Stay: Payer: Medicare Other

## 2022-03-17 ENCOUNTER — Encounter: Payer: Self-pay | Admitting: *Deleted

## 2022-03-17 VITALS — BP 132/66 | HR 52

## 2022-03-17 VITALS — BP 149/65 | HR 60 | Temp 98.1°F | Resp 18 | Ht 65.0 in | Wt 186.8 lb

## 2022-03-17 DIAGNOSIS — Z5112 Encounter for antineoplastic immunotherapy: Secondary | ICD-10-CM | POA: Insufficient documentation

## 2022-03-17 DIAGNOSIS — C3432 Malignant neoplasm of lower lobe, left bronchus or lung: Secondary | ICD-10-CM | POA: Insufficient documentation

## 2022-03-17 DIAGNOSIS — C9000 Multiple myeloma not having achieved remission: Secondary | ICD-10-CM | POA: Diagnosis not present

## 2022-03-17 DIAGNOSIS — C3492 Malignant neoplasm of unspecified part of left bronchus or lung: Secondary | ICD-10-CM

## 2022-03-17 DIAGNOSIS — R350 Frequency of micturition: Secondary | ICD-10-CM | POA: Diagnosis not present

## 2022-03-17 DIAGNOSIS — C7951 Secondary malignant neoplasm of bone: Secondary | ICD-10-CM | POA: Insufficient documentation

## 2022-03-17 LAB — CMP (CANCER CENTER ONLY)
ALT: 15 U/L (ref 0–44)
AST: 17 U/L (ref 15–41)
Albumin: 4.2 g/dL (ref 3.5–5.0)
Alkaline Phosphatase: 50 U/L (ref 38–126)
Anion gap: 7 (ref 5–15)
BUN: 26 mg/dL — ABNORMAL HIGH (ref 8–23)
CO2: 27 mmol/L (ref 22–32)
Calcium: 9.3 mg/dL (ref 8.9–10.3)
Chloride: 102 mmol/L (ref 98–111)
Creatinine: 0.98 mg/dL (ref 0.44–1.00)
GFR, Estimated: 57 mL/min — ABNORMAL LOW (ref >60)
Glucose, Bld: 105 mg/dL — ABNORMAL HIGH (ref 70–99)
Potassium: 4.4 mmol/L (ref 3.5–5.1)
Sodium: 136 mmol/L (ref 135–145)
Total Bilirubin: 0.4 mg/dL (ref 0.3–1.2)
Total Protein: 6.7 g/dL (ref 6.5–8.1)

## 2022-03-17 LAB — URINALYSIS, COMPLETE (UACMP) WITH MICROSCOPIC
Bilirubin Urine: NEGATIVE
Glucose, UA: NEGATIVE mg/dL
Hgb urine dipstick: NEGATIVE
Ketones, ur: NEGATIVE mg/dL
Leukocytes,Ua: NEGATIVE
Nitrite: NEGATIVE
Protein, ur: NEGATIVE mg/dL
Specific Gravity, Urine: 1.009 (ref 1.005–1.030)
pH: 5.5 (ref 5.0–8.0)

## 2022-03-17 LAB — CBC WITH DIFFERENTIAL (CANCER CENTER ONLY)
Abs Immature Granulocytes: 0.01 10*3/uL (ref 0.00–0.07)
Basophils Absolute: 0 10*3/uL (ref 0.0–0.1)
Basophils Relative: 1 %
Eosinophils Absolute: 0.1 10*3/uL (ref 0.0–0.5)
Eosinophils Relative: 2 %
HCT: 34.9 % — ABNORMAL LOW (ref 36.0–46.0)
Hemoglobin: 11.4 g/dL — ABNORMAL LOW (ref 12.0–15.0)
Immature Granulocytes: 0 %
Lymphocytes Relative: 29 %
Lymphs Abs: 1.2 10*3/uL (ref 0.7–4.0)
MCH: 31.1 pg (ref 26.0–34.0)
MCHC: 32.7 g/dL (ref 30.0–36.0)
MCV: 95.4 fL (ref 80.0–100.0)
Monocytes Absolute: 0.5 10*3/uL (ref 0.1–1.0)
Monocytes Relative: 12 %
Neutro Abs: 2.3 10*3/uL (ref 1.7–7.7)
Neutrophils Relative %: 56 %
Platelet Count: 157 10*3/uL (ref 150–400)
RBC: 3.66 MIL/uL — ABNORMAL LOW (ref 3.87–5.11)
RDW: 13.6 % (ref 11.5–15.5)
WBC Count: 4 10*3/uL (ref 4.0–10.5)
nRBC: 0 % (ref 0.0–0.2)

## 2022-03-17 MED ORDER — SODIUM CHLORIDE 0.9 % IV SOLN: Freq: Once | INTRAVENOUS | Status: AC

## 2022-03-17 MED ORDER — SODIUM CHLORIDE 0.9% FLUSH
10.0000 mL | INTRAVENOUS | Status: DC | PRN
  Administered 2022-03-17: 10 mL

## 2022-03-17 MED ORDER — SODIUM CHLORIDE 0.9 % IV SOLN
200.0000 mg | Freq: Once | INTRAVENOUS | Status: AC
  Administered 2022-03-17: 200 mg via INTRAVENOUS
  Filled 2022-03-17: qty 8

## 2022-03-17 MED ORDER — HEPARIN SOD (PORK) LOCK FLUSH 100 UNIT/ML IV SOLN
500.0000 [IU] | Freq: Once | INTRAVENOUS | Status: AC | PRN
  Administered 2022-03-17: 500 [IU]

## 2022-03-17 NOTE — Addendum Note (Signed)
Addended by: [name] B on: 03/17/2022 11:14 AM   Modules accepted: Orders

## 2022-03-17 NOTE — Progress Notes (Signed)
Patient seen by Ned Card NP today  Vitals are within treatment parameters. Provider aware of SBP 149-no intervention needed.  Labs reviewed by Ned Card NP and are within treatment parameters.  Per physician team, patient is ready for treatment and there are NO modifications to the treatment plan.

## 2022-03-17 NOTE — Patient Instructions (Signed)
[facility]   Discharge Instructions: Thank you for choosing [facility] to provide your oncology and hematology care.   If you have a lab appointment with the [facility], please go directly to the [facility] and check in at the registration area.   Wear comfortable clothing and clothing appropriate for easy access to any Portacath or PICC line.   We strive to give you quality time with your provider. You may need to reschedule your appointment if you arrive late (15 or more minutes).  Arriving late affects you and other patients whose appointments are after yours.  Also, if you miss three or more appointments without notifying the office, you may be dismissed from the clinic at the provider's discretion.      For prescription refill requests, have your pharmacy contact our office and allow 72 hours for refills to be completed.    Today you received the following chemotherapy and/or immunotherapy agents Keytruda.      To help prevent nausea and vomiting after your treatment, we encourage you to take your nausea medication as directed.  BELOW ARE SYMPTOMS THAT SHOULD BE REPORTED IMMEDIATELY: *FEVER GREATER THAN 100.4 F (38 C) OR HIGHER *CHILLS OR SWEATING *NAUSEA AND VOMITING THAT IS NOT CONTROLLED WITH YOUR NAUSEA MEDICATION *UNUSUAL SHORTNESS OF BREATH *UNUSUAL BRUISING OR BLEEDING *URINARY PROBLEMS (pain or burning when urinating, or frequent urination) *BOWEL PROBLEMS (unusual diarrhea, constipation, pain near the anus) TENDERNESS IN MOUTH AND THROAT WITH OR WITHOUT PRESENCE OF ULCERS (sore throat, sores in mouth, or a toothache) UNUSUAL RASH, SWELLING OR PAIN  UNUSUAL VAGINAL DISCHARGE OR ITCHING   Items with * indicate a potential emergency and should be followed up as soon as possible or go to the Emergency Department if any problems should occur.  Please show the CHEMOTHERAPY ALERT CARD or IMMUNOTHERAPY ALERT CARD at check-in to  the Emergency Department and triage nurse.  Should you have questions after your visit or need to cancel or reschedule your appointment, please contact [facility]  Dept: [phone]  and follow the prompts.  Office hours are 8:00 a.m. to 4:30 p.m. Monday - Friday. Please note that voicemails left after 4:00 p.m. may not be returned until the following business day.  We are closed weekends and major holidays. You have access to a nurse at all times for urgent questions. Please call the main number to the clinic Dept: [phone] and follow the prompts.   For any non-urgent questions, you may also contact your provider using MyChart. We now offer e-Visits for anyone 67 and older to request care online for non-urgent symptoms. For details visit [email or web address].   Also download the MyChart app! Go to the app store, search "MyChart", open the app, select [facility], and log in with your MyChart username and password.  Masks are optional in the cancer centers. If you would like for your care team to wear a mask while they are taking care of you, please let them know. You may have one support person who is at least 85 years old accompany you for your appointments.  Pembrolizumab Injection What is this medication? PEMBROLIZUMAB (PEM broe LIZ ue mab) treats some types of cancer. It works by helping your immune system slow or stop the spread of cancer cells. It is a monoclonal antibody. This medicine may be used for other purposes; ask your health care provider or pharmacist if you have questions. COMMON BRAND NAME(S): [employer]  What should I tell my care team before I take this medication? They need to know if you have any of these conditions: Allogeneic stem cell transplant (uses someone else's stem cells) Autoimmune diseases, such as Crohn disease, ulcerative colitis, lupus History of chest radiation Nervous system problems, such as Guillain-Barre syndrome,  myasthenia gravis Organ transplant An unusual or allergic reaction to pembrolizumab, other medications, foods, dyes, or preservatives Pregnant or trying to get pregnant Breast-feeding How should I use this medication? This medication is injected into a vein. It is given by your care team in a hospital or clinic setting. A special MedGuide will be given to you before each treatment. Be sure to read this information carefully each time. Talk to your care team about the use of this medication in children. While it may be prescribed for children as young as 6 months for selected conditions, precautions do apply. Overdosage: If you think you have taken too much of this medicine contact a poison control center or emergency room at once. NOTE: This medicine is only for you. Do not share this medicine with others. What if I miss a dose? Keep appointments for follow-up doses. It is important not to miss your dose. Call your care team if you are unable to keep an appointment. What may interact with this medication? Interactions have not been studied. This list may not describe all possible interactions. Give your health care provider a list of all the medicines, herbs, non-prescription drugs, or dietary supplements you use. Also tell them if you smoke, drink alcohol, or use illegal drugs. Some items may interact with your medicine. What should I watch for while using this medication? Your condition will be monitored carefully while you are receiving this medication. You may need blood work while taking this medication. This medication may cause serious skin reactions. They can happen weeks to months after starting the medication. Contact your care team right away if you notice fevers or flu-like symptoms with a rash. The rash may be red or purple and then turn into blisters or peeling of the skin. You may also notice a red rash with swelling of the face, lips, or lymph nodes in your neck or under your  arms. Tell your care team right away if you have any change in your eyesight. Talk to your care team if you may be pregnant. Serious birth defects can occur if you take this medication during pregnancy and for 4 months after the last dose. You will need a negative pregnancy test before starting this medication. Contraception is recommended while taking this medication and for 4 months after the last dose. Your care team can help you find the option that works for you. Do not breastfeed while taking this medication and for 4 months after the last dose. What side effects may I notice from receiving this medication? Side effects that you should report to your care team as soon as possible: Allergic reactions--skin rash, itching, hives, swelling of the face, lips, tongue, or throat Dry cough, shortness of breath or trouble breathing Eye pain, redness, irritation, or discharge with blurry or decreased vision Heart muscle inflammation--unusual weakness or fatigue, shortness of breath, chest pain, fast or irregular heartbeat, dizziness, swelling of the ankles, feet, or hands Hormone gland problems--headache, sensitivity to light, unusual weakness or fatigue, dizziness, fast or irregular heartbeat, increased sensitivity to cold or heat, excessive sweating, constipation, hair loss, increased thirst or amount of urine, tremors or shaking, irritability Infusion reactions--chest pain, shortness of  breath or trouble breathing, feeling faint or lightheaded Kidney injury (glomerulonephritis)--decrease in the amount of urine, red or dark brown urine, foamy or bubbly urine, swelling of the ankles, hands, or feet Liver injury--right upper belly pain, loss of appetite, nausea, light-colored stool, dark yellow or brown urine, yellowing skin or eyes, unusual weakness or fatigue Pain, tingling, or numbness in the hands or feet, muscle weakness, change in vision, confusion or trouble speaking, loss of balance or  coordination, trouble walking, seizures Rash, fever, and swollen lymph nodes Redness, blistering, peeling, or loosening of the skin, including inside the mouth Sudden or severe stomach pain, bloody diarrhea, fever, nausea, vomiting Side effects that usually do not require medical attention (report to your care team if they continue or are bothersome): Bone, joint, or muscle pain Diarrhea Fatigue Loss of appetite Nausea Skin rash This list may not describe all possible side effects. Call your doctor for medical advice about side effects. You may report side effects to FDA at 1-800-FDA-1088. Where should I keep my medication? This medication is given in a hospital or clinic. It will not be stored at home. NOTE: This sheet is a summary. It may not cover all possible information. If you have questions about this medicine, talk to your doctor, pharmacist, or health care provider.  2023 Elsevier/Gold Standard (2021-11-07 00:00:00)

## 2022-03-17 NOTE — Progress Notes (Addendum)
[facility] OFFICE PROGRESS NOTE   Diagnosis: Non-small cell lung cancer  INTERVAL HISTORY:   Ms. Mentzel returns as scheduled.  She completed another cycle of Pembrolizumab 02/24/2022.  No rash or diarrhea.  Recent urinary frequency.  No dysuria.  Continued upper back pain, may be slightly worse at the left upper back.  Main complaint is exhaustion.  Objective:  Vital signs in last 24 hours:  Blood pressure (!) 149/65, pulse 60, temperature 98.1 F (36.7 C), temperature source Oral, resp. rate 18, height '5\' 5"'  (1.651 m), weight 186 lb 12.8 oz (84.7 kg), SpO2 98 %.    HEENT: No thrush or ulcers. Resp: Lungs clear bilaterally. Cardio: Regular rate and rhythm. GI: No hepatosplenomegaly. Vascular: No leg edema. Skin: No rash. Port-A-Cath without erythema.  Lab Results:  Lab Results  Component Value Date   WBC 4.0 03/17/2022   HGB 11.4 (L) 03/17/2022   HCT 34.9 (L) 03/17/2022   MCV 95.4 03/17/2022   PLT 157 03/17/2022   NEUTROABS 2.3 03/17/2022    Imaging:  No results found.  Medications: I have reviewed the patient's current medications.  Assessment/Plan: Non-small cell lung cancer MRI lumbar spine 04/29/2019- enlarging marrow lesions involving the L1 vertebral body, upper left sacrum and right iliac bone MRI pelvis 04/29/2019- 3.5 cm left iliac bone lesion appears slightly larger; other similar appearing lesions present within the left superior pubic ramus, left superior abdomen acetabulum and upper left sacrum Kappa free light chains with mild elevation 05/12/2019  CTs 05/12/2019- left lower lobe pulmonary mass 3.3 x 3.2 cm; lytic process left iliac bone; spinal lesions; 1.1 cm low-density left kidney lesion; right thyroid enlargement with heterogeneous appearance with potential for multiple discrete lesions Biopsy left lower lobe lung mass 05/26/2019-poorly differentiated carcinoma; positive for cytokeratin 5/6, p63 and TTF-1, no EGFR, BRAF, ALK,  ERBB2,ROS, or NTRK alteration Cycle 1 carboplatin/Alimta/pembrolizumab 06/06/2019 Cycle 2 carboplatin/Alimta/pembrolizumab 06/27/2019 Cycle 3 carboplatin/Alimta/pembrolizumab 07/18/2019 Cycle 4 carboplatin/Alimta/pembrolizumab 08/07/2019 CTs 08/27/2019-significant decrease in size of lobulated mass left lower lobe.  Unchanged appearance of subtle bone lesions. Cycle 5 Alimta/pembrolizumab 08/28/2019 Cycle 6 Alimta/pembrolizumab 09/18/2019 Cycle 7 Alimta/pembrolizumab 10/09/2019 Cycle 8 Alimta/pembrolizumab 10/30/2019 Cycle 9 Alimta/pembrolizumab 11/20/2019 CTs 12/09/2019-no evidence of disease progression, left lower lobe nodule slightly decreased in size, stable L1, left sacral, and left pubic ramus metastases Cycle 10 Alimta/pembrolizumab 12/11/2019 Cycle 11 pembrolizumab alone 01/02/2020 (Alimta held due to edema, tenderness, erythema at the lower legs) Cycle 12 pembrolizumab 01/23/2020 Cycle 13 pembrolizumab 02/12/2020 Cycle 14 pembrolizumab 03/04/2020 CTs 03/18/2020-stable left lower lobe lesion, mild sclerosis at the superior endplate of L1 that was previously hypermetabolic, stable small left upper sacral lucent lesion, previous left superior pubic ramus lesion is occult on the CT, CT head negative for malignancy Cycle 15 pembrolizumab 03/25/2020 Cycle 16 pembrolizumab 04/21/2020 Cycle 17 pembrolizumab 05/17/2020 05/19/2020 bone scan-no definite abnormalities to suggest osseous metastases.  Areas of concern on prior PET-CT involving left iliac bone and L1 vertebral body showed no abnormalities on the current study Cycle 18 Pembrolizumab 06/10/2020 Cycle 19 Pembrolizumab 06/30/2020 CTs 07/16/2020-stable left lower lobe nodule, stable faint superior L1 vertebral lesion, no evidence of disease progression Cycle 20 pembrolizumab 07/21/2020 Cycle 21 Pembrolizumab 08/11/2020 Cycle 22 pembrolizumab 09/01/2020 Cycle 23 Pembrolizumab 09/22/2020 Cycle 24 pembrolizumab 10/13/2020 Cycle 25 Pembrolizumab  11/03/2020 Cycle 26 pembrolizumab 11/26/2020 CTs 12/15/2020- stable left lower lobe mass, stable sclerotic lesion at L2, no evidence of disease progression Cycle 27 pembrolizumab 12/17/2020 PET scan 01/03/2021-1.8 x 1.3 cm left lower lobe nodule similar in  size to CT of 12/15/2020 and measures smaller than previous PET/CT from 2020.  Nodule is markedly hypermetabolic.  No evidence for hypermetabolic hilar or mediastinal lymphadenopathy.  Several tiny foci of hypermetabolism identified in bony anatomy raising concern for skeletal metastases.  Comparison of the PET to CT 07/16/2020-lobular left lower lobe pulmonary nodule measured 2.4 x 1.5 cm on the prior study, current study it measured 1.8 x 1.3 cm.  Lesion in the anterior left acetabulum is similar to the 07/16/2020 exam although overlying cortical thinning slightly more pronounced on the current study.  Described lesion in the scapula shows some cortical sclerosis and a tiny central marrow lucency not substantially changed compared to 07/16/2020.  Left third rib lesion shows heterogeneous mineralization similar to 07/16/2020. MRI of cervical spine 01/13/2021-increased left facet edema at C5-C6, severe facet arthrosis on the left at C7-T1 and on the right at C3-C4, no evidence of metastatic disease Cycle 28 Pembrolizumab 01/14/2021 Cycle 29 Pembrolizumab 02/03/2021 Cycle 30 Pembrolizumab 02/24/2021 MRI left hip 03/02/2021-compared to 04/29/2019, slight increase in size of metastases at the left iliac and left acetabulum.  Lesion at the left upper sacrum is less conspicuous and a lesion at the left superior pubic ramus is stable Cycle 31 pembrolizumab 03/17/2021 Radiation to left acetabulum and left iliac 03/30/2021-04/13/2021 Cycle 32 pembrolizumab 04/07/2021 Cycle 33 Pembrolizumab 04/28/2021 Cycle 34 pembrolizumab 05/18/2021 PET scan 06/01/2021-no significant change in size or degree of FDG uptake associated with FDG avid left lower lobe lung nodule compatible with  neoplasm.  Stable to improved appearance of multifocal FDG avid bone metastasis. Cycle 35 pembrolizumab 06/10/2021 Cycle 36 pembrolizumab 07/01/2021 Cycle 37 pembrolizumab 07/29/2021 Cycle 38 pembrolizumab 08/19/2021 Cycle 39 Pembrolizumab 09/09/2021 Cycle 40 pembrolizumab 09/30/2021 CT chest 10/20/2021 to evaluate complaints of chest pain-no PE.  Interval increase in left lower lobe mass. Cycle 41 Pembrolizumab 10/21/2021 Cycle 42 pembrolizumab 11/11/2021 PET 11/24/2021-mild increase in size of left lower lobe mass, no evidence of solid organ or nodal metastases, stable multifocal bone metastases.  No new sites of metastatic disease. Cycle 43 pembrolizumab 11/29/2021 Cycle 45 Pembrolizumab 12/20/2021 Cycle 46 Pembrolizumab 01/09/2022 Cycle 47 pembrolizumab 02/01/2022 Cycle 48 Pembrolizumab 02/24/2022 Cycle 49 Pembrolizumab 03/17/2022 Pain secondary to #1, improved Chronic back pain Type 2 diabetes Essential hypertension CAD Hyperlipidemia Family history significant for multiple members with breast cancer Grade 1 skin rash 07/18/2019 likely related to immunotherapy.  Topical steroid cream as needed. E. coli urinary tract infection 07/14/2019.  Completed cephalexin. Edema/tenderness at the right greater than left ankle 10/21/2019-etiology unclear, potentially related to systemic therapy or an infection, doxycycline prescribed-improved 10/23/2019; marked improvement 11/20/2019; at office visit 01/02/2020 she reports worsening of lower extremity edema, pain/tenderness, erythema 3 to 4 days following each treatment.  Alimta held 01/02/2020.  Referral to dermatology. COVID-19 infection 03/30/2020, monoclonal antibody therapy 04/06/2020 Arthritis, possibly related to Pembrolizumab, improved since beginning Medrol, now followed by Dr. Amil Amen      Disposition: [name] appears unchanged.  There is no clinical evidence of disease progression.  Plan to continue Pembrolizumab every 3 weeks.  Restaging PET scan at a  52-monthinterval.  CBC and chemistry panel reviewed.  Labs adequate to proceed with Pembrolizumab.  She complains of urinary frequency.  We will check a urinalysis and culture today.  She will return for lab, follow-up, Pembrolizumab in 3 weeks.  LNed CardANP/GNP-BC   03/17/2022  9:50 AM

## 2022-03-17 NOTE — Addendum Note (Signed)
Addended by: [name] B on: 03/17/2022 10:49 AM   Modules accepted: Orders

## 2022-03-20 ENCOUNTER — Encounter: Payer: Self-pay | Admitting: *Deleted

## 2022-03-20 NOTE — Progress Notes (Signed)
Informed by lab supervisor that urine culture was not processed in time, so unable to complete this test. Called patient and informed her and inquired as to her current symptoms. She reports frequency but no burning or strong odor to urine. She declines to recollect at this time. Will call if anything changes.

## 2022-03-21 ENCOUNTER — Other Ambulatory Visit: Payer: Self-pay | Admitting: Cardiovascular Disease

## 2022-03-24 ENCOUNTER — Other Ambulatory Visit: Payer: Self-pay

## 2022-03-24 ENCOUNTER — Encounter: Payer: Self-pay | Admitting: Oncology

## 2022-03-24 DIAGNOSIS — C9000 Multiple myeloma not having achieved remission: Secondary | ICD-10-CM

## 2022-03-24 DIAGNOSIS — C3492 Malignant neoplasm of unspecified part of left bronchus or lung: Secondary | ICD-10-CM

## 2022-03-25 ENCOUNTER — Ambulatory Visit (HOSPITAL_BASED_OUTPATIENT_CLINIC_OR_DEPARTMENT_OTHER)
Admission: RE | Admit: 2022-03-25 | Discharge: 2022-03-25 | Disposition: A | Discharge status: Home / Self Care | Payer: Medicare Other | Source: Ambulatory Visit | Attending: Oncology | Admitting: Oncology

## 2022-03-25 ENCOUNTER — Encounter: Payer: Self-pay | Admitting: Oncology

## 2022-03-25 DIAGNOSIS — C9000 Multiple myeloma not having achieved remission: Secondary | ICD-10-CM | POA: Insufficient documentation

## 2022-03-25 DIAGNOSIS — C3492 Malignant neoplasm of unspecified part of left bronchus or lung: Secondary | ICD-10-CM

## 2022-03-27 ENCOUNTER — Encounter: Payer: Self-pay | Admitting: Oncology

## 2022-03-28 ENCOUNTER — Encounter (HOSPITAL_BASED_OUTPATIENT_CLINIC_OR_DEPARTMENT_OTHER): Payer: Self-pay

## 2022-03-28 ENCOUNTER — Ambulatory Visit (HOSPITAL_BASED_OUTPATIENT_CLINIC_OR_DEPARTMENT_OTHER)
Admission: RE | Admit: 2022-03-28 | Discharge: 2022-03-28 | Disposition: A | Discharge status: Home / Self Care | Payer: Medicare Other | Source: Ambulatory Visit | Attending: Nurse Practitioner | Admitting: Nurse Practitioner

## 2022-03-28 ENCOUNTER — Encounter: Payer: Self-pay | Admitting: Nurse Practitioner

## 2022-03-28 ENCOUNTER — Telehealth: Payer: Self-pay

## 2022-03-28 ENCOUNTER — Inpatient Hospital Stay (HOSPITAL_BASED_OUTPATIENT_CLINIC_OR_DEPARTMENT_OTHER): Payer: Medicare Other | Admitting: Nurse Practitioner

## 2022-03-28 VITALS — BP 132/60 | HR 63 | Temp 97.4°F | Resp 18 | Wt 187.6 lb

## 2022-03-28 DIAGNOSIS — C3492 Malignant neoplasm of unspecified part of left bronchus or lung: Secondary | ICD-10-CM | POA: Insufficient documentation

## 2022-03-28 DIAGNOSIS — Z5112 Encounter for antineoplastic immunotherapy: Secondary | ICD-10-CM | POA: Diagnosis not present

## 2022-03-28 NOTE — Telephone Encounter (Signed)
Spoke to pt regarding her CT results per Ned Card, NP. Pt verbalizes understanding.

## 2022-03-28 NOTE — Telephone Encounter (Signed)
-----   Message from Owens Shark, NP sent at 03/28/2022  2:46 PM EDT ----- Please let her know there was no fracture on the CT scan.  Continue pain medication.  Follow-up as scheduled.

## 2022-03-28 NOTE — Progress Notes (Signed)
[facility] OFFICE PROGRESS NOTE   Diagnosis: Non-small cell lung cancer  INTERVAL HISTORY:   [name] returns prior to scheduled follow-up for evaluation of hip pain.  She sent a message to the office on 03/24/2022 to report left hip pain.  X-rays of the left hip and femur 03/25/2022 returned negative.  She messaged the office again on 03/27/2022 to report continued pain.  She is seen today for further evaluation.  She reports sudden onset of pain at the left hip and groin approximately 1 week ago.  The pain increases with weightbearing.  No leg weakness or numbness.  Vicodin "eases" the pain.  She is not aware of any injury.  No falls.  Objective:  Vital signs in last 24 hours:  Blood pressure 132/60, pulse 63, temperature (!) 97.4 F (36.3 C), temperature source Tympanic, resp. rate 18, weight 187 lb 9.6 oz (85.1 kg), SpO2 95 %.    Resp: Lungs clear bilaterally. Cardio: Regular rate and rhythm. GI: No hepatosplenomegaly. Vascular: No leg edema. Neuro: Lower extremity motor strength is intact. Musculoskeletal: Marked tenderness over the left femoral head/upper femur and groin. Port-A-Cath without erythema.   Lab Results:  Lab Results  Component Value Date   WBC 4.0 03/17/2022   HGB 11.4 (L) 03/17/2022   HCT 34.9 (L) 03/17/2022   MCV 95.4 03/17/2022   PLT 157 03/17/2022   NEUTROABS 2.3 03/17/2022    Imaging:  No results found.  Medications: I have reviewed the patient's current medications.  Assessment/Plan: Non-small cell lung cancer MRI lumbar spine 04/29/2019- enlarging marrow lesions involving the L1 vertebral body, upper left sacrum and right iliac bone MRI pelvis 04/29/2019- 3.5 cm left iliac bone lesion appears slightly larger; other similar appearing lesions present within the left superior pubic ramus, left superior abdomen acetabulum and upper left sacrum Kappa free light chains with mild elevation 05/12/2019  CTs 05/12/2019- left lower  lobe pulmonary mass 3.3 x 3.2 cm; lytic process left iliac bone; spinal lesions; 1.1 cm low-density left kidney lesion; right thyroid enlargement with heterogeneous appearance with potential for multiple discrete lesions Biopsy left lower lobe lung mass 05/26/2019-poorly differentiated carcinoma; positive for cytokeratin 5/6, p63 and TTF-1, no EGFR, BRAF, ALK, ERBB2,ROS, or NTRK alteration Cycle 1 carboplatin/Alimta/pembrolizumab 06/06/2019 Cycle 2 carboplatin/Alimta/pembrolizumab 06/27/2019 Cycle 3 carboplatin/Alimta/pembrolizumab 07/18/2019 Cycle 4 carboplatin/Alimta/pembrolizumab 08/07/2019 CTs 08/27/2019-significant decrease in size of lobulated mass left lower lobe.  Unchanged appearance of subtle bone lesions. Cycle 5 Alimta/pembrolizumab 08/28/2019 Cycle 6 Alimta/pembrolizumab 09/18/2019 Cycle 7 Alimta/pembrolizumab 10/09/2019 Cycle 8 Alimta/pembrolizumab 10/30/2019 Cycle 9 Alimta/pembrolizumab 11/20/2019 CTs 12/09/2019-no evidence of disease progression, left lower lobe nodule slightly decreased in size, stable L1, left sacral, and left pubic ramus metastases Cycle 10 Alimta/pembrolizumab 12/11/2019 Cycle 11 pembrolizumab alone 01/02/2020 (Alimta held due to edema, tenderness, erythema at the lower legs) Cycle 12 pembrolizumab 01/23/2020 Cycle 13 pembrolizumab 02/12/2020 Cycle 14 pembrolizumab 03/04/2020 CTs 03/18/2020-stable left lower lobe lesion, mild sclerosis at the superior endplate of L1 that was previously hypermetabolic, stable small left upper sacral lucent lesion, previous left superior pubic ramus lesion is occult on the CT, CT head negative for malignancy Cycle 15 pembrolizumab 03/25/2020 Cycle 16 pembrolizumab 04/21/2020 Cycle 17 pembrolizumab 05/17/2020 05/19/2020 bone scan-no definite abnormalities to suggest osseous metastases.  Areas of concern on prior PET-CT involving left iliac bone and L1 vertebral body showed no abnormalities on the current study Cycle 18 Pembrolizumab  06/10/2020 Cycle 19 Pembrolizumab 06/30/2020 CTs 07/16/2020-stable left lower lobe nodule, stable faint superior L1 vertebral lesion, no  evidence of disease progression Cycle 20 pembrolizumab 07/21/2020 Cycle 21 Pembrolizumab 08/11/2020 Cycle 22 pembrolizumab 09/01/2020 Cycle 23 Pembrolizumab 09/22/2020 Cycle 24 pembrolizumab 10/13/2020 Cycle 25 Pembrolizumab 11/03/2020 Cycle 26 pembrolizumab 11/26/2020 CTs 12/15/2020- stable left lower lobe mass, stable sclerotic lesion at L2, no evidence of disease progression Cycle 27 pembrolizumab 12/17/2020 PET scan 01/03/2021-1.8 x 1.3 cm left lower lobe nodule similar in size to CT of 12/15/2020 and measures smaller than previous PET/CT from 2020.  Nodule is markedly hypermetabolic.  No evidence for hypermetabolic hilar or mediastinal lymphadenopathy.  Several tiny foci of hypermetabolism identified in bony anatomy raising concern for skeletal metastases.  Comparison of the PET to CT 07/16/2020-lobular left lower lobe pulmonary nodule measured 2.4 x 1.5 cm on the prior study, current study it measured 1.8 x 1.3 cm.  Lesion in the anterior left acetabulum is similar to the 07/16/2020 exam although overlying cortical thinning slightly more pronounced on the current study.  Described lesion in the scapula shows some cortical sclerosis and a tiny central marrow lucency not substantially changed compared to 07/16/2020.  Left third rib lesion shows heterogeneous mineralization similar to 07/16/2020. MRI of cervical spine 01/13/2021-increased left facet edema at C5-C6, severe facet arthrosis on the left at C7-T1 and on the right at C3-C4, no evidence of metastatic disease Cycle 28 Pembrolizumab 01/14/2021 Cycle 29 Pembrolizumab 02/03/2021 Cycle 30 Pembrolizumab 02/24/2021 MRI left hip 03/02/2021-compared to 04/29/2019, slight increase in size of metastases at the left iliac and left acetabulum.  Lesion at the left upper sacrum is less conspicuous and a lesion at the left superior pubic  ramus is stable Cycle 31 pembrolizumab 03/17/2021 Radiation to left acetabulum and left iliac 03/30/2021-04/13/2021 Cycle 32 pembrolizumab 04/07/2021 Cycle 33 Pembrolizumab 04/28/2021 Cycle 34 pembrolizumab 05/18/2021 PET scan 06/01/2021-no significant change in size or degree of FDG uptake associated with FDG avid left lower lobe lung nodule compatible with neoplasm.  Stable to improved appearance of multifocal FDG avid bone metastasis. Cycle 35 pembrolizumab 06/10/2021 Cycle 36 pembrolizumab 07/01/2021 Cycle 37 pembrolizumab 07/29/2021 Cycle 38 pembrolizumab 08/19/2021 Cycle 39 Pembrolizumab 09/09/2021 Cycle 40 pembrolizumab 09/30/2021 CT chest 10/20/2021 to evaluate complaints of chest pain-no PE.  Interval increase in left lower lobe mass. Cycle 41 Pembrolizumab 10/21/2021 Cycle 42 pembrolizumab 11/11/2021 PET 11/24/2021-mild increase in size of left lower lobe mass, no evidence of solid organ or nodal metastases, stable multifocal bone metastases.  No new sites of metastatic disease. Cycle 43 pembrolizumab 11/29/2021 Cycle 45 Pembrolizumab 12/20/2021 Cycle 46 Pembrolizumab 01/09/2022 Cycle 47 pembrolizumab 02/01/2022 Cycle 48 Pembrolizumab 02/24/2022 Cycle 49 Pembrolizumab 03/17/2022 Pain secondary to #1, improved Chronic back pain Type 2 diabetes Essential hypertension CAD Hyperlipidemia Family history significant for multiple members with breast cancer Grade 1 skin rash 07/18/2019 likely related to immunotherapy.  Topical steroid cream as needed. E. coli urinary tract infection 07/14/2019.  Completed cephalexin. Edema/tenderness at the right greater than left ankle 10/21/2019-etiology unclear, potentially related to systemic therapy or an infection, doxycycline prescribed-improved 10/23/2019; marked improvement 11/20/2019; at office visit 01/02/2020 she reports worsening of lower extremity edema, pain/tenderness, erythema 3 to 4 days following each treatment.  Alimta held 01/02/2020.  Referral to  dermatology. COVID-19 infection 03/30/2020, monoclonal antibody therapy 04/06/2020 Arthritis, possibly related to Pembrolizumab, improved since beginning Medrol, now followed by Dr. Amil Amen Pain left femoral head/upper femur/groin-get a plain x-ray; referred for CTs    Disposition: [name] has metastatic lung cancer.  She is on active treatment with Pembrolizumab.  She presents today with a 1 week history of pain/tenderness  left hip and groin.  Plain x-ray yesterday was negative.  She has known metastatic disease left acetabulum, iliac bone, sacrum.  Plan to proceed with noncontrast CT imaging of the left hip and pelvis.  We will contact her once that result is available.  Patient seen with Dr. Benay Spice.  Ned Card ANP/GNP-BC   03/28/2022  12:19 PM This was a shared visit with Ned Card.  [name] was interviewed and examined.  She presents with acute onset pain at the left hip/groin.  The etiology of the pain is unclear.  A plain x-ray was unremarkable.  We will refer her for a CT of the pelvis.  He could have an occult fracture or new bone lesion not apparent on CT.   I was present for greater than 50% of today's visit.  I performed medical stage making.  Julieanne Manson, MD

## 2022-03-28 NOTE — Telephone Encounter (Signed)
No answer. MyChart message sent alternatively.

## 2022-03-29 ENCOUNTER — Other Ambulatory Visit: Payer: Self-pay | Admitting: Oncology

## 2022-03-29 ENCOUNTER — Encounter: Payer: Self-pay | Admitting: Oncology

## 2022-03-29 DIAGNOSIS — C3492 Malignant neoplasm of unspecified part of left bronchus or lung: Secondary | ICD-10-CM

## 2022-03-29 NOTE — Progress Notes (Signed)
OFF PATHWAY REGIMEN - Non-Small Cell Lung  No Change  Continue With Treatment as Ordered.  Original Decision Date/Time: 05/29/2019 18:01   [id number]:Pembrolizumab 200 mg  IV D1 + Pemetrexed 500 mg/m2 IV D1 + Carboplatin AUC=5 IV D1 q21 Days:   A cycle is every 21 days:     Pembrolizumab      Pemetrexed      Carboplatin   **Always confirm dose/schedule in your pharmacy ordering system**  Patient Characteristics: Stage IV Metastatic, Nonsquamous, Initial Chemotherapy/Immunotherapy, PS = 0, 1, ALK or EGFR or ROS1 or NTRK or MET or RET Genomic Alterations - Awaiting Test Results AJCC T Category: Staged < 8th Ed. Current Disease Status: Distant Metastases AJCC N Category: Staged < 8th Ed. AJCC M Category: Staged < 8th Ed. AJCC 8 Stage Grouping: Staged < 8th Ed. Histology: Nonsquamous Cell ROS1 Rearrangement Status: Awaiting Test Results T790M Mutation Status: Not Applicable - EGFR Mutation Negative/Unknown Other Mutations/Biomarkers: No Other Actionable Mutations Chemotherapy/Immunotherapy LOT: Initial Chemotherapy/Immunotherapy Molecular Targeted Therapy: Not Appropriate MET Exon 14 Mutation Status: Awaiting Test Results RET Gene Fusion Status: Awaiting Test Results EGFR Mutation Status: Awaiting Test Results NTRK Gene Fusion Status: Awaiting Test Results PD-L1 Expression Status: Awaiting Test Results ALK Rearrangement Status: Awaiting Test Results BRAF V600E Mutation Status: Awaiting Test Results ECOG Performance Status: 1 Intent of Therapy: Non-Curative / Palliative Intent, Discussed with Patient

## 2022-04-07 ENCOUNTER — Inpatient Hospital Stay: Payer: Medicare Other

## 2022-04-07 ENCOUNTER — Encounter: Payer: Self-pay | Admitting: Nurse Practitioner

## 2022-04-07 ENCOUNTER — Inpatient Hospital Stay: Payer: Medicare Other | Attending: Oncology

## 2022-04-07 ENCOUNTER — Encounter: Payer: Self-pay | Admitting: *Deleted

## 2022-04-07 ENCOUNTER — Inpatient Hospital Stay (HOSPITAL_BASED_OUTPATIENT_CLINIC_OR_DEPARTMENT_OTHER): Payer: Medicare Other | Admitting: Nurse Practitioner

## 2022-04-07 VITALS — BP 156/67 | HR 54 | Temp 97.8°F | Resp 16 | Ht 65.0 in | Wt 187.4 lb

## 2022-04-07 VITALS — BP 147/60 | HR 52

## 2022-04-07 DIAGNOSIS — C3492 Malignant neoplasm of unspecified part of left bronchus or lung: Secondary | ICD-10-CM

## 2022-04-07 DIAGNOSIS — C7951 Secondary malignant neoplasm of bone: Secondary | ICD-10-CM | POA: Insufficient documentation

## 2022-04-07 DIAGNOSIS — Z5112 Encounter for antineoplastic immunotherapy: Secondary | ICD-10-CM | POA: Insufficient documentation

## 2022-04-07 DIAGNOSIS — Z79899 Other long term (current) drug therapy: Secondary | ICD-10-CM | POA: Diagnosis not present

## 2022-04-07 DIAGNOSIS — C3432 Malignant neoplasm of lower lobe, left bronchus or lung: Secondary | ICD-10-CM | POA: Diagnosis present

## 2022-04-07 LAB — CMP (CANCER CENTER ONLY)
ALT: 17 U/L (ref 0–44)
AST: 20 U/L (ref 15–41)
Albumin: 4.4 g/dL (ref 3.5–5.0)
Alkaline Phosphatase: 44 U/L (ref 38–126)
Anion gap: 7 (ref 5–15)
BUN: 19 mg/dL (ref 8–23)
CO2: 28 mmol/L (ref 22–32)
Calcium: 9.7 mg/dL (ref 8.9–10.3)
Chloride: 103 mmol/L (ref 98–111)
Creatinine: 0.89 mg/dL (ref 0.44–1.00)
GFR, Estimated: >60 mL/min (ref >60)
Glucose, Bld: 89 mg/dL (ref 70–99)
Potassium: 4.4 mmol/L (ref 3.5–5.1)
Sodium: 138 mmol/L (ref 135–145)
Total Bilirubin: 0.3 mg/dL (ref 0.3–1.2)
Total Protein: 6.8 g/dL (ref 6.5–8.1)

## 2022-04-07 LAB — CBC WITH DIFFERENTIAL (CANCER CENTER ONLY)
Abs Immature Granulocytes: 0.03 10*3/uL (ref 0.00–0.07)
Basophils Absolute: 0 10*3/uL (ref 0.0–0.1)
Basophils Relative: 0 %
Eosinophils Absolute: 0 10*3/uL (ref 0.0–0.5)
Eosinophils Relative: 1 %
HCT: 35.7 % — ABNORMAL LOW (ref 36.0–46.0)
Hemoglobin: 11.7 g/dL — ABNORMAL LOW (ref 12.0–15.0)
Immature Granulocytes: 1 %
Lymphocytes Relative: 9 %
Lymphs Abs: 0.6 10*3/uL — ABNORMAL LOW (ref 0.7–4.0)
MCH: 30.9 pg (ref 26.0–34.0)
MCHC: 32.8 g/dL (ref 30.0–36.0)
MCV: 94.2 fL (ref 80.0–100.0)
Monocytes Absolute: 0.4 10*3/uL (ref 0.1–1.0)
Monocytes Relative: 7 %
Neutro Abs: 5.1 10*3/uL (ref 1.7–7.7)
Neutrophils Relative %: 82 %
Platelet Count: 159 10*3/uL (ref 150–400)
RBC: 3.79 MIL/uL — ABNORMAL LOW (ref 3.87–5.11)
RDW: 13.3 % (ref 11.5–15.5)
WBC Count: 6.1 10*3/uL (ref 4.0–10.5)
nRBC: 0 % (ref 0.0–0.2)

## 2022-04-07 MED ORDER — SODIUM CHLORIDE 0.9 % IV SOLN
200.0000 mg | Freq: Once | INTRAVENOUS | Status: AC
  Administered 2022-04-07: 200 mg via INTRAVENOUS
  Filled 2022-04-07: qty 8

## 2022-04-07 MED ORDER — SODIUM CHLORIDE 0.9% FLUSH
10.0000 mL | INTRAVENOUS | Status: DC | PRN
  Administered 2022-04-07: 10 mL

## 2022-04-07 MED ORDER — SODIUM CHLORIDE 0.9 % IV SOLN: Freq: Once | INTRAVENOUS | Status: AC

## 2022-04-07 MED ORDER — HEPARIN SOD (PORK) LOCK FLUSH 100 UNIT/ML IV SOLN
500.0000 [IU] | Freq: Once | INTRAVENOUS | Status: AC | PRN
  Administered 2022-04-07: 500 [IU]

## 2022-04-07 MED ORDER — HYDROCODONE-ACETAMINOPHEN 5-325 MG PO TABS: 1.0000 | ORAL_TABLET | Freq: Four times a day (QID) | ORAL | 0 refills | Status: DC | PRN

## 2022-04-07 NOTE — Patient Instructions (Signed)
[facility]   Discharge Instructions: Thank you for choosing [facility] to provide your oncology and hematology care.   If you have a lab appointment with the [facility], please go directly to the [facility] and check in at the registration area.   Wear comfortable clothing and clothing appropriate for easy access to any Portacath or PICC line.   We strive to give you quality time with your provider. You may need to reschedule your appointment if you arrive late (15 or more minutes).  Arriving late affects you and other patients whose appointments are after yours.  Also, if you miss three or more appointments without notifying the office, you may be dismissed from the clinic at the provider's discretion.      For prescription refill requests, have your pharmacy contact our office and allow 72 hours for refills to be completed.    Today you received the following chemotherapy and/or immunotherapy agents Pembrolizumab (KEYTRUDA).      To help prevent nausea and vomiting after your treatment, we encourage you to take your nausea medication as directed.  BELOW ARE SYMPTOMS THAT SHOULD BE REPORTED IMMEDIATELY: *FEVER GREATER THAN 100.4 F (38 C) OR HIGHER *CHILLS OR SWEATING *NAUSEA AND VOMITING THAT IS NOT CONTROLLED WITH YOUR NAUSEA MEDICATION *UNUSUAL SHORTNESS OF BREATH *UNUSUAL BRUISING OR BLEEDING *URINARY PROBLEMS (pain or burning when urinating, or frequent urination) *BOWEL PROBLEMS (unusual diarrhea, constipation, pain near the anus) TENDERNESS IN MOUTH AND THROAT WITH OR WITHOUT PRESENCE OF ULCERS (sore throat, sores in mouth, or a toothache) UNUSUAL RASH, SWELLING OR PAIN  UNUSUAL VAGINAL DISCHARGE OR ITCHING   Items with * indicate a potential emergency and should be followed up as soon as possible or go to the Emergency Department if any problems should occur.  Please show the CHEMOTHERAPY ALERT CARD or IMMUNOTHERAPY ALERT CARD  at check-in to the Emergency Department and triage nurse.  Should you have questions after your visit or need to cancel or reschedule your appointment, please contact [facility]  Dept: [phone]  and follow the prompts.  Office hours are 8:00 a.m. to 4:30 p.m. Monday - Friday. Please note that voicemails left after 4:00 p.m. may not be returned until the following business day.  We are closed weekends and major holidays. You have access to a nurse at all times for urgent questions. Please call the main number to the clinic Dept: [phone] and follow the prompts.   For any non-urgent questions, you may also contact your provider using MyChart. We now offer e-Visits for anyone 50 and older to request care online for non-urgent symptoms. For details visit [email or web address].   Also download the MyChart app! Go to the app store, search "MyChart", open the app, select [facility], and log in with your MyChart username and password.  Masks are optional in the cancer centers. If you would like for your care team to wear a mask while they are taking care of you, please let them know. You may have one support person who is at least 85 years old accompany you for your appointments.  Pembrolizumab Injection What is this medication? PEMBROLIZUMAB (PEM broe LIZ ue mab) treats some types of cancer. It works by helping your immune system slow or stop the spread of cancer cells. It is a monoclonal antibody. This medicine may be used for other purposes; ask your health care provider or pharmacist if you have questions. COMMON BRAND NAME(S):  Keytruda What should I tell my care team before I take this medication? They need to know if you have any of these conditions: Allogeneic stem cell transplant (uses someone else's stem cells) Autoimmune diseases, such as Crohn disease, ulcerative colitis, lupus History of chest radiation Nervous system problems, such as Guillain-Barre  syndrome, myasthenia gravis Organ transplant An unusual or allergic reaction to pembrolizumab, other medications, foods, dyes, or preservatives Pregnant or trying to get pregnant Breast-feeding How should I use this medication? This medication is injected into a vein. It is given by your care team in a hospital or clinic setting. A special MedGuide will be given to you before each treatment. Be sure to read this information carefully each time. Talk to your care team about the use of this medication in children. While it may be prescribed for children as young as 6 months for selected conditions, precautions do apply. Overdosage: If you think you have taken too much of this medicine contact a poison control center or emergency room at once. NOTE: This medicine is only for you. Do not share this medicine with others. What if I miss a dose? Keep appointments for follow-up doses. It is important not to miss your dose. Call your care team if you are unable to keep an appointment. What may interact with this medication? Interactions have not been studied. This list may not describe all possible interactions. Give your health care provider a list of all the medicines, herbs, non-prescription drugs, or dietary supplements you use. Also tell them if you smoke, drink alcohol, or use illegal drugs. Some items may interact with your medicine. What should I watch for while using this medication? Your condition will be monitored carefully while you are receiving this medication. You may need blood work while taking this medication. This medication may cause serious skin reactions. They can happen weeks to months after starting the medication. Contact your care team right away if you notice fevers or flu-like symptoms with a rash. The rash may be red or purple and then turn into blisters or peeling of the skin. You may also notice a red rash with swelling of the face, lips, or lymph nodes in your neck or under  your arms. Tell your care team right away if you have any change in your eyesight. Talk to your care team if you may be pregnant. Serious birth defects can occur if you take this medication during pregnancy and for 4 months after the last dose. You will need a negative pregnancy test before starting this medication. Contraception is recommended while taking this medication and for 4 months after the last dose. Your care team can help you find the option that works for you. Do not breastfeed while taking this medication and for 4 months after the last dose. What side effects may I notice from receiving this medication? Side effects that you should report to your care team as soon as possible: Allergic reactions--skin rash, itching, hives, swelling of the face, lips, tongue, or throat Dry cough, shortness of breath or trouble breathing Eye pain, redness, irritation, or discharge with blurry or decreased vision Heart muscle inflammation--unusual weakness or fatigue, shortness of breath, chest pain, fast or irregular heartbeat, dizziness, swelling of the ankles, feet, or hands Hormone gland problems--headache, sensitivity to light, unusual weakness or fatigue, dizziness, fast or irregular heartbeat, increased sensitivity to cold or heat, excessive sweating, constipation, hair loss, increased thirst or amount of urine, tremors or shaking, irritability Infusion reactions--chest pain, shortness  of breath or trouble breathing, feeling faint or lightheaded Kidney injury (glomerulonephritis)--decrease in the amount of urine, red or dark brown urine, foamy or bubbly urine, swelling of the ankles, hands, or feet Liver injury--right upper belly pain, loss of appetite, nausea, light-colored stool, dark yellow or brown urine, yellowing skin or eyes, unusual weakness or fatigue Pain, tingling, or numbness in the hands or feet, muscle weakness, change in vision, confusion or trouble speaking, loss of balance or  coordination, trouble walking, seizures Rash, fever, and swollen lymph nodes Redness, blistering, peeling, or loosening of the skin, including inside the mouth Sudden or severe stomach pain, bloody diarrhea, fever, nausea, vomiting Side effects that usually do not require medical attention (report to your care team if they continue or are bothersome): Bone, joint, or muscle pain Diarrhea Fatigue Loss of appetite Nausea Skin rash This list may not describe all possible side effects. Call your doctor for medical advice about side effects. You may report side effects to FDA at 1-800-FDA-1088. Where should I keep my medication? This medication is given in a hospital or clinic. It will not be stored at home. NOTE: This sheet is a summary. It may not cover all possible information. If you have questions about this medicine, talk to your doctor, pharmacist, or health care provider.  2023 Elsevier/Gold Standard (2021-11-07 00:00:00)

## 2022-04-07 NOTE — Progress Notes (Signed)
[name]   Diagnosis: Non-small cell lung cancer  INTERVAL HISTORY:   [name] returns as scheduled.  She denies nausea/vomiting.  No diarrhea.  No rash.  She has a good appetite.  She continues to have pain with ambulation involving the right hip/groin.  She is taking one half of a hydrocodone tablet 3 times every other day.  No leg weakness or numbness.  No back pain.  Objective:  Vital signs in last 24 hours:  Blood pressure (!) 156/67, pulse (!) 54, temperature 97.8 F (36.6 C), resp. rate 16, height _0  (1.651 m), weight 187 lb 6.4 oz (85 kg), SpO2 96 %.    HEENT: No thrush or ulcers. Resp: Lungs clear bilaterally. Cardio: Regular rate and rhythm. GI: No hepatosplenomegaly. Vascular: No leg edema. Musculoskeletal: Marked tenderness left hip/upper femur/groin. Neuro: Lower extremity motor strength 5/5. Skin: No rash. Portacath without erythema.   Lab Results:  Lab Results  Component Value Date   WBC 6.1 04/07/2022   HGB 11.7 (L) 04/07/2022   HCT 35.7 (L) 04/07/2022   MCV 94.2 04/07/2022   PLT 159 04/07/2022   NEUTROABS 5.1 04/07/2022    Imaging:  No results found.  Medications: I have reviewed the patient's current medications.  Assessment/Plan: Non-small cell lung cancer MRI lumbar spine 04/29/2019- enlarging marrow lesions involving the L1 vertebral body, upper left sacrum and right iliac bone MRI pelvis 04/29/2019- 3.5 cm left iliac bone lesion appears slightly larger; other similar appearing lesions present within the left superior pubic ramus, left superior abdomen acetabulum and upper left sacrum Kappa free light chains with mild elevation 05/12/2019  CTs 05/12/2019- left lower lobe pulmonary mass 3.3 x 3.2 cm; lytic process left iliac bone; spinal lesions; 1.1 cm low-density left kidney lesion; right thyroid enlargement with heterogeneous appearance with potential for multiple discrete lesions Biopsy left  lower lobe lung mass 05/26/2019-poorly differentiated carcinoma; positive for cytokeratin 5/6, p63 and TTF-1, no EGFR, BRAF, ALK, ERBB2,ROS, or NTRK alteration Cycle 1 carboplatin/Alimta/pembrolizumab 06/06/2019 Cycle 2 carboplatin/Alimta/pembrolizumab 06/27/2019 Cycle 3 carboplatin/Alimta/pembrolizumab 07/18/2019 Cycle 4 carboplatin/Alimta/pembrolizumab 08/07/2019 CTs 08/27/2019-significant decrease in size of lobulated mass left lower lobe.  Unchanged appearance of subtle bone lesions. Cycle 5 Alimta/pembrolizumab 08/28/2019 Cycle 6 Alimta/pembrolizumab 09/18/2019 Cycle 7 Alimta/pembrolizumab 10/09/2019 Cycle 8 Alimta/pembrolizumab 10/30/2019 Cycle 9 Alimta/pembrolizumab 11/20/2019 CTs 12/09/2019-no evidence of disease progression, left lower lobe nodule slightly decreased in size, stable L1, left sacral, and left pubic ramus metastases Cycle 10 Alimta/pembrolizumab 12/11/2019 Cycle 11 pembrolizumab alone 01/02/2020 (Alimta held due to edema, tenderness, erythema at the lower legs) Cycle 12 pembrolizumab 01/23/2020 Cycle 13 pembrolizumab 02/12/2020 Cycle 14 pembrolizumab 03/04/2020 CTs 03/18/2020-stable left lower lobe lesion, mild sclerosis at the superior endplate of L1 that was previously hypermetabolic, stable small left upper sacral lucent lesion, previous left superior pubic ramus lesion is occult on the CT, CT head negative for malignancy Cycle 15 pembrolizumab 03/25/2020 Cycle 16 pembrolizumab 04/21/2020 Cycle 17 pembrolizumab 05/17/2020 05/19/2020 bone scan-no definite abnormalities to suggest osseous metastases.  Areas of concern on prior PET-CT involving left iliac bone and L1 vertebral body showed no abnormalities on the current study Cycle 18 Pembrolizumab 06/10/2020 Cycle 19 Pembrolizumab 06/30/2020 CTs 07/16/2020-stable left lower lobe nodule, stable faint superior L1 vertebral lesion, no evidence of disease progression Cycle 20 pembrolizumab 07/21/2020 Cycle 21 Pembrolizumab 08/11/2020 Cycle 22  pembrolizumab 09/01/2020 Cycle 23 Pembrolizumab 09/22/2020 Cycle 24 pembrolizumab 10/13/2020 Cycle 25 Pembrolizumab 11/03/2020 Cycle 26 pembrolizumab 11/26/2020 CTs 12/15/2020- stable left lower lobe mass, stable  sclerotic lesion at L2, no evidence of disease progression Cycle 27 pembrolizumab 12/17/2020 PET scan 01/03/2021-1.8 x 1.3 cm left lower lobe nodule similar in size to CT of 12/15/2020 and measures smaller than previous PET/CT from 2020.  Nodule is markedly hypermetabolic.  No evidence for hypermetabolic hilar or mediastinal lymphadenopathy.  Several tiny foci of hypermetabolism identified in bony anatomy raising concern for skeletal metastases.  Comparison of the PET to CT 07/16/2020-lobular left lower lobe pulmonary nodule measured 2.4 x 1.5 cm on the prior study, current study it measured 1.8 x 1.3 cm.  Lesion in the anterior left acetabulum is similar to the 07/16/2020 exam although overlying cortical thinning slightly more pronounced on the current study.  Described lesion in the scapula shows some cortical sclerosis and a tiny central marrow lucency not substantially changed compared to 07/16/2020.  Left third rib lesion shows heterogeneous mineralization similar to 07/16/2020. MRI of cervical spine 01/13/2021-increased left facet edema at C5-C6, severe facet arthrosis on the left at C7-T1 and on the right at C3-C4, no evidence of metastatic disease Cycle 28 Pembrolizumab 01/14/2021 Cycle 29 Pembrolizumab 02/03/2021 Cycle 30 Pembrolizumab 02/24/2021 MRI left hip 03/02/2021-compared to 04/29/2019, slight increase in size of metastases at the left iliac and left acetabulum.  Lesion at the left upper sacrum is less conspicuous and a lesion at the left superior pubic ramus is stable Cycle 31 pembrolizumab 03/17/2021 Radiation to left acetabulum and left iliac 03/30/2021-04/13/2021 Cycle 32 pembrolizumab 04/07/2021 Cycle 33 Pembrolizumab 04/28/2021 Cycle 34 pembrolizumab 05/18/2021 PET scan 06/01/2021-no  significant change in size or degree of FDG uptake associated with FDG avid left lower lobe lung nodule compatible with neoplasm.  Stable to improved appearance of multifocal FDG avid bone metastasis. Cycle 35 pembrolizumab 06/10/2021 Cycle 36 pembrolizumab 07/01/2021 Cycle 37 pembrolizumab 07/29/2021 Cycle 38 pembrolizumab 08/19/2021 Cycle 39 Pembrolizumab 09/09/2021 Cycle 40 pembrolizumab 09/30/2021 CT chest 10/20/2021 to evaluate complaints of chest pain-no PE.  Interval increase in left lower lobe mass. Cycle 41 Pembrolizumab 10/21/2021 Cycle 42 pembrolizumab 11/11/2021 PET 11/24/2021-mild increase in size of left lower lobe mass, no evidence of solid organ or nodal metastases, stable multifocal bone metastases.  No new sites of metastatic disease. Cycle 43 pembrolizumab 11/29/2021 Cycle 45 Pembrolizumab 12/20/2021 Cycle 46 Pembrolizumab 01/09/2022 Cycle 47 pembrolizumab 02/01/2022 Cycle 48 Pembrolizumab 02/24/2022 Cycle 49 Pembrolizumab 03/17/2022 Pain secondary to #1, improved Chronic back pain Type 2 diabetes Essential hypertension CAD Hyperlipidemia Family history significant for multiple members with breast cancer Grade 1 skin rash 07/18/2019 likely related to immunotherapy.  Topical steroid cream as needed. E. coli urinary tract infection 07/14/2019.  Completed cephalexin. Edema/tenderness at the right greater than left ankle 10/21/2019-etiology unclear, potentially related to systemic therapy or an infection, doxycycline prescribed-improved 10/23/2019; marked improvement 11/20/2019; at office visit 01/02/2020 she reports worsening of lower extremity edema, pain/tenderness, erythema 3 to 4 days following each treatment.  Alimta held 01/02/2020.  Referral to dermatology. COVID-19 infection 03/30/2020, monoclonal antibody therapy 04/06/2020 Arthritis, possibly related to Pembrolizumab, improved since beginning Medrol, now followed by Dr. Amil Amen Pain left femoral head/upper femur/groin-negative plain  x-ray; CT pelvis 03/28/2022-no acute appearing bone abnormality at the left hip or involving the proximal left femur, no evidence of acute fracture or dislocation, soft tissues left hip and left groin unremarkable; osseous mets within the left sacrum, left iliac bone and anterior acetabulum without evidence of associated fracture or dislocation.    Disposition: [name] appears unchanged.  Plan to continue Pembrolizumab every 3 weeks, treatment today.  CBC and chemistry  panel reviewed.  Labs adequate to proceed as above.  She continues to have pain/tenderness at the region of the left upper femur/hip/groin.  Referring for MRI pelvis.  She will return for follow-up in 3 weeks.  We will contact her once MRI results are available.    Ned Card ANP/GNP-BC   04/07/2022  10:58 AM

## 2022-04-07 NOTE — Progress Notes (Signed)
Patient seen by [name] NP today  Vitals are within treatment parameters.  Labs reviewed by [name] NP and are within treatment parameters.  Per physician team, patient is ready for treatment and there are NO modifications to the treatment plan.     

## 2022-04-12 ENCOUNTER — Ambulatory Visit (HOSPITAL_COMMUNITY)
Admission: RE | Admit: 2022-04-12 | Discharge: 2022-04-12 | Disposition: A | Discharge status: Home / Self Care | Payer: Medicare Other | Source: Ambulatory Visit | Attending: Nurse Practitioner | Admitting: Nurse Practitioner

## 2022-04-12 ENCOUNTER — Ambulatory Visit: Payer: Medicare Other

## 2022-04-12 DIAGNOSIS — C3492 Malignant neoplasm of unspecified part of left bronchus or lung: Secondary | ICD-10-CM | POA: Diagnosis present

## 2022-04-12 MED ORDER — GADOBUTROL 1 MMOL/ML IV SOLN
8.5000 mL | Freq: Once | INTRAVENOUS | Status: AC | PRN
  Administered 2022-04-12: 8.5 mL via INTRAVENOUS

## 2022-04-17 ENCOUNTER — Telehealth: Payer: Self-pay

## 2022-04-17 NOTE — Telephone Encounter (Signed)
IR did an addendum

## 2022-04-17 NOTE — Telephone Encounter (Signed)
-----   Message from Owens Shark, NP sent at 04/17/2022  8:13 AM EDT ----- Please call radiology-need clarification, is the left acetabular lesion new?

## 2022-04-18 ENCOUNTER — Encounter: Payer: Self-pay | Admitting: Oncology

## 2022-04-18 ENCOUNTER — Other Ambulatory Visit: Payer: Self-pay | Admitting: *Deleted

## 2022-04-18 DIAGNOSIS — C3492 Malignant neoplasm of unspecified part of left bronchus or lung: Secondary | ICD-10-CM

## 2022-04-18 NOTE — Progress Notes (Signed)
Per Dr. Benay Spice: MRI may be showing new area of mets left hip area. Referral order placed for Dr. Sondra Come to determine if she can have RT to this area at request of Dr. Benay Spice. Patient made aware.

## 2022-04-20 ENCOUNTER — Ambulatory Visit
Admission: RE | Admit: 2022-04-20 | Discharge: 2022-04-20 | Disposition: A | Discharge status: Home / Self Care | Payer: Medicare Other | Source: Ambulatory Visit | Attending: Radiation Oncology | Admitting: Radiation Oncology

## 2022-04-20 ENCOUNTER — Encounter: Payer: Self-pay | Admitting: Radiation Oncology

## 2022-04-20 ENCOUNTER — Other Ambulatory Visit: Payer: Self-pay

## 2022-04-20 VITALS — BP 112/64 | HR 63 | Temp 97.8°F | Resp 17 | Ht 65.0 in | Wt 187.8 lb

## 2022-04-20 DIAGNOSIS — E119 Type 2 diabetes mellitus without complications: Secondary | ICD-10-CM | POA: Insufficient documentation

## 2022-04-20 DIAGNOSIS — M129 Arthropathy, unspecified: Secondary | ICD-10-CM | POA: Diagnosis not present

## 2022-04-20 DIAGNOSIS — C3492 Malignant neoplasm of unspecified part of left bronchus or lung: Secondary | ICD-10-CM

## 2022-04-20 DIAGNOSIS — R011 Cardiac murmur, unspecified: Secondary | ICD-10-CM | POA: Insufficient documentation

## 2022-04-20 DIAGNOSIS — C7951 Secondary malignant neoplasm of bone: Secondary | ICD-10-CM

## 2022-04-20 DIAGNOSIS — I10 Essential (primary) hypertension: Secondary | ICD-10-CM | POA: Diagnosis not present

## 2022-04-20 DIAGNOSIS — C3432 Malignant neoplasm of lower lobe, left bronchus or lung: Secondary | ICD-10-CM | POA: Diagnosis not present

## 2022-04-20 DIAGNOSIS — M160 Bilateral primary osteoarthritis of hip: Secondary | ICD-10-CM | POA: Insufficient documentation

## 2022-04-20 DIAGNOSIS — M47817 Spondylosis without myelopathy or radiculopathy, lumbosacral region: Secondary | ICD-10-CM | POA: Insufficient documentation

## 2022-04-20 DIAGNOSIS — E042 Nontoxic multinodular goiter: Secondary | ICD-10-CM | POA: Diagnosis not present

## 2022-04-20 DIAGNOSIS — M810 Age-related osteoporosis without current pathological fracture: Secondary | ICD-10-CM | POA: Insufficient documentation

## 2022-04-20 DIAGNOSIS — M47816 Spondylosis without myelopathy or radiculopathy, lumbar region: Secondary | ICD-10-CM | POA: Insufficient documentation

## 2022-04-20 DIAGNOSIS — E785 Hyperlipidemia, unspecified: Secondary | ICD-10-CM | POA: Diagnosis not present

## 2022-04-20 DIAGNOSIS — Z7982 Long term (current) use of aspirin: Secondary | ICD-10-CM | POA: Insufficient documentation

## 2022-04-20 DIAGNOSIS — I2510 Atherosclerotic heart disease of native coronary artery without angina pectoris: Secondary | ICD-10-CM | POA: Insufficient documentation

## 2022-04-20 DIAGNOSIS — Z7984 Long term (current) use of oral hypoglycemic drugs: Secondary | ICD-10-CM | POA: Insufficient documentation

## 2022-04-20 DIAGNOSIS — Z794 Long term (current) use of insulin: Secondary | ICD-10-CM | POA: Insufficient documentation

## 2022-04-20 DIAGNOSIS — Z79899 Other long term (current) drug therapy: Secondary | ICD-10-CM | POA: Insufficient documentation

## 2022-04-20 NOTE — Progress Notes (Signed)
LT hip bone-met reconsult. I verified patient's identity and began nursing interview w/ spouse Mr. [name] in attendance. Patient reports LT hip pain 10/10, worsening w/ ambulation and fatigue. Patient also reports LT shoulder pain 8/10. No other issues reported at this time.  Meaningful use complete. Hysterectomy  BP 112/64 (BP Location: Left Arm, Patient Position: Sitting, Cuff Size: Normal)   Pulse 63   Temp 97.8 F (36.6 C) (Temporal)   Resp 17   Ht _0  (1.651 m)   Wt 187 lb 12.8 oz (85.2 kg)   SpO2 95%   BMI 31.25 kg/m

## 2022-04-20 NOTE — Progress Notes (Signed)
Radiation Oncology         [phone] ________________________________  Name: [name]        MRN: [id number]  Date of Service: 04/20/2022 DOB: [birth year]  IW:[id number], Tina Searing, MD  Tina Pier, MD     REFERRING PHYSICIAN: Ladell Pier, MD   DIAGNOSIS: The primary encounter diagnosis was Lung cancer, primary, with metastasis from lung to other site, left [facility]). A diagnosis of Metastasis to bone [facility]) was also pertinent to this visit.   HISTORY OF PRESENT ILLNESS: [name] is a 87 y.o. female seen at the request of Dr. Benay Spice with a history of stage IV, NSCLC of the left lung. 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 She received palliative radiotherapy to the left hip in 2022 involving the left iliac and acetabular region. She has been receiving single agent pembrolizumab since completing chemotherapy in May 2021. She has had mild increase since then in her left lower lobe lung nodule, but progressive disease in the hip based on recent MRI pelvis on 04/12/22 that showed a left sided superior acetabular lesion with adjacent periosteal reaction consistent with new bone metastasis. She's seen today to discuss treatment options of additional radiotherapy.    PREVIOUS RADIATION THERAPY:   03/30/2021 through 04/13/2021 Site Technique Total Dose (Gy) Dose per Fx (Gy) Completed Fx Beam Energies  Ilium, Left: Pelvis_Lt Complex 30/30 3 10/10 15X    PAST MEDICAL HISTORY:  Past Medical History:  Diagnosis Date   Aneurysm ([place])    Right eye a non DES stent was placed so that she would not required long term dual antiplatelet therapy.   Anginal pain (HCC)    Anxiety    not currently taking any meds   Arthritis    CAD (coronary artery disease)    Diabetes mellitus without complication ([place])    H/O hiatal hernia    Heart murmur    History of stress test  03/31/2012   The post stress myocardial perfusion images show a normal pattern of perfusion in all region. The post left ventricles is normal in size. There is no scintigraphic of inductible myocardial ischemia. The post EF is 59   Hx of echocardiogram 11/29/2010   Ef 67% Normal size chambes, Aortic valve sclerosis without stenosis, No other significant valvular abnormalities, No percardial effusion.   Hyperlipemia    Hypertension    Insulin resistance    Lung cancer (HCC)    Multiple thyroid nodules    Neuromuscular disorder (HCC)    nerve pain after shingles   Osteoporosis    Restless legs    Shingles        PAST SURGICAL HISTORY: Past Surgical History:  Procedure Laterality Date   ABDOMINAL HYSTERECTOMY  1970   ANGIOPLASTY     Stenting of a 90% eccentric right coronary artery stenosis and had a 3.0x15 mm Integrity bare-metal stent inserted   ANTERIOR CERVICAL DECOMP/DISCECTOMY FUSION N/A 07/30/2013   Procedure: ANTERIOR CERVICAL DECOMPRESSION/DISCECTOMY FUSION CERVICAL FIVE -SIX;  Surgeon: Eustace Moore, MD;  Location: [facility];  Service: Neurosurgery;  Laterality: N/A;   APPENDECTOMY     BREAST SURGERY Left    cysts removed from left breast (in her 20'2)   CARDIAC CATHETERIZATION     Showed a widely patent stent, she did have 60% ostial diagonal-1 stenosis, She also had mild luminal irregularities of her LAD.  COLONOSCOPY     EYE SURGERY Bilateral 2009   cateract surgery- bilateral   IR IMAGING GUIDED PORT INSERTION  06/04/2019   LAMINECTOMY WITH POSTERIOR LATERAL ARTHRODESIS LEVEL 2 Right 07/18/2021   Procedure: Laminectomy and Foraminotomy - Lumbar four-Lumbar five - Lumbar five-Sacral one- right, posterolateral fusion Lumbar four-five with interspinous plate;  Surgeon: Eustace Moore, MD;  Location: [facility];  Service: Neurosurgery;  Laterality: Right;   LEFT HEART CATH AND CORONARY ANGIOGRAPHY N/A 11/15/2021   Procedure: LEFT HEART CATH AND CORONARY ANGIOGRAPHY;  Surgeon:  Troy Sine, MD;  Location: [facility];  Service: Cardiovascular;  Laterality: N/A;   REFRACTIVE SURGERY Left 2011   TONSILLECTOMY       FAMILY HISTORY:  Family History  Problem Relation Age of Onset   Alzheimer's disease Mother 11   Colon polyps Mother    Diabetes Mother    Heart attack Father 58   Cancer Maternal Grandmother 24   Breast cancer Maternal Grandmother    Diabetes Sister    Breast cancer Maternal Uncle    Breast cancer Maternal Aunt        x 2   Colon polyps Sister    Colon polyps Maternal Aunt    Colon cancer Neg Hx    Esophageal cancer Neg Hx    Rectal cancer Neg Hx    Stomach cancer Neg Hx      SOCIAL HISTORY:  reports that she has quit smoking. Her smoking use included cigarettes. She has a 66.00 pack-year smoking history. She has never used smokeless tobacco. She reports current alcohol use. She reports that she does not use drugs. The patient is married and lives in [place]. She's accompanied by her husband who is also a prior patient of Dr. Ida Rogue. He's also been undergoing treatment for recurrent esophageal cancer.    ALLERGIES: Codeine   MEDICATIONS:  Current Outpatient Medications  Medication Sig Dispense Refill   ALPRAZolam (XANAX) 0.5 MG tablet Take 0.5 mg by mouth 2 (two) times daily as needed for anxiety.     amLODipine (NORVASC) 2.5 MG tablet Take 2.5 mg by mouth daily.     aspirin EC 81 MG tablet Take 1 tablet (81 mg total) by mouth daily. Swallow whole. 90 tablet 3   carboxymethylcellulose (REFRESH PLUS) 0.5 % SOLN Place 1 drop into both eyes 3 (three) times daily as needed (dry eyes).     diclofenac Sodium (VOLTAREN) 1 % GEL Apply 1 application. topically at bedtime as needed (pain).     gabapentin (NEURONTIN) 300 MG capsule Take 300 mg by mouth at bedtime.      hydrochlorothiazide (MICROZIDE) 12.5 MG capsule Take 12.5 mg by mouth daily as needed (fluid).     HYDROcodone-acetaminophen (NORCO) 5-325 MG tablet Take 1-2 tablets  by mouth every 6 (six) hours as needed for moderate pain. 30 tablet 0   isosorbide mononitrate (IMDUR) 30 MG 24 hr tablet TAKE 1 TABLET BY MOUTH DAILY 90 tablet 3   lisinopril (ZESTRIL) 10 MG tablet TAKE 1 TABLET BY MOUTH  DAILY 90 tablet 3   Magnesium 250 MG TABS Take 250 mg by mouth at bedtime.      metFORMIN (GLUCOPHAGE) 500 MG tablet Take 500 mg by mouth at bedtime.     methylPREDNISolone (MEDROL) 4 MG tablet Take 4 mg by mouth daily.     metoprolol tartrate (LOPRESSOR) 25 MG tablet Take 25( 1 tablet)   in the morning  and 12.5 mg ( 1/2 Tablet)  in the evening 45 tablet 6   Multiple Vitamins-Minerals (CENTRUM SILVER PO) Take 1 tablet by mouth daily.     nitroGLYCERIN (NITROSTAT) 0.4 MG SL tablet Place 1 tablet (0.4 mg total) under the tongue every 5 (five) minutes as needed for chest pain. (Patient not taking: Reported on 02/24/2022) 25 tablet 11   polyethylene glycol (MIRALAX / GLYCOLAX) packet Take 17 g by mouth daily as needed for moderate constipation.     rOPINIRole (REQUIP) 1 MG tablet Take 1 mg by mouth 3 (three) times daily.     rosuvastatin (CRESTOR) 40 MG tablet Take 40 mg by mouth at bedtime.      traZODone (DESYREL) 100 MG tablet Take 100 mg by mouth at bedtime.      Vitamin D, Ergocalciferol, (DRISDOL) 50000 UNITS CAPS capsule Take 50,000 Units by mouth every Sunday.     zinc gluconate 50 MG tablet Take 50 mg by mouth at bedtime.     No current facility-administered medications for this encounter.     REVIEW OF SYSTEMS: On review of systems, the patient reports she's tolerating her immunotherapy pretty well. She's not had significant rashes, but has had some GI symptoms over time from her immunotherapy. She is glad that it has continued to keep most of her disease under control. She describes pain in her left shoulder and lower neck, which is more uncomfortable to her than her left hip pain. That being said, her hip pain makes it difficult for her to get around and she states that  putting weight on her left foot and leg is very painful. Her symptoms of bone pain are sharp and throbbing in nature. The pain in her hip goes into her medial groin and mid thigh but does not extend. She denies any paresthesias. She describes her left shoulder pain as sharp and stabbing and wakes her up in the middle of the night multiple times a week. Norco pain medication does lessen her symptoms, but she's taking higher and more frequent doses than she had weeks prior. No other complaints are verbalized.   PHYSICAL EXAM:  Wt Readings from Last 3 Encounters:  04/20/22 187 lb 12.8 oz (85.2 kg)  04/07/22 187 lb 6.4 oz (85 kg)  03/28/22 187 lb 9.6 oz (85.1 kg)   Temp Readings from Last 3 Encounters:  04/20/22 97.8 F (36.6 C) (Temporal)  04/07/22 97.8 F (36.6 C)  03/28/22 (!) 97.4 F (36.3 C) (Tympanic)   BP Readings from Last 3 Encounters:  04/20/22 112/64  04/07/22 (!) 147/60  04/07/22 (!) 156/67   Pulse Readings from Last 3 Encounters:  04/20/22 63  04/07/22 (!) 52  04/07/22 (!) 54   Pain Assessment Pain Score: 10-Worst pain ever (LT hip)/10  In general this is a well appearing caucasian female in no acute distress. She's alert and oriented x4 and appropriate throughout the examination. Cardiopulmonary assessment is negative for acute distress and she exhibits normal effort.     ECOG = 1  0 - Asymptomatic (Fully active, able to carry on all predisease activities without restriction)  1 - Symptomatic but completely ambulatory (Restricted in physically strenuous activity but ambulatory and able to carry out work of a light or sedentary nature. For example, light housework, office work)  2 - Symptomatic, <50% in bed during the day (Ambulatory and capable of all self care but unable to carry out any work activities. Up and about more than 50% of waking hours)  3 - Symptomatic, >50% in bed,  but not bedbound (Capable of only limited self-care, confined to bed or chair 50% or  more of waking hours)  4 - Bedbound (Completely disabled. Cannot carry on any self-care. Totally confined to bed or chair)  5 - Death   Eustace Pen MM, Creech RH, Tormey DC, et al. [phone]). "Toxicity and response criteria of the [facility]". [name] Oncol. 5 (6): 649-55    LABORATORY DATA:  Lab Results  Component Value Date   WBC 6.1 04/07/2022   HGB 11.7 (L) 04/07/2022   HCT 35.7 (L) 04/07/2022   MCV 94.2 04/07/2022   PLT 159 04/07/2022   Lab Results  Component Value Date   NA 138 04/07/2022   K 4.4 04/07/2022   CL 103 04/07/2022   CO2 28 04/07/2022   Lab Results  Component Value Date   ALT 17 04/07/2022   AST 20 04/07/2022   ALKPHOS 44 04/07/2022   BILITOT 0.3 04/07/2022      RADIOGRAPHY: MR Pelvis W Wo Contrast  Addendum Date: 04/17/2022   ADDENDUM REPORT: 04/17/2022 11:36 ADDENDUM: The left superior acetabular bone lesion is new compared with 04/29/2019 and the PET-CT dated 11/24/2021. The bone lesion in the left superior acetabulum is not well delineated on the CT of the pelvis dated 03/28/2022, but MRI is more sensitive for marrow lesions. The other bone lesions are also not as well delineated as they are on the MRI. Electronically Signed   By: Kathreen Devoid M.D.   On: 04/17/2022 11:36   Result Date: 04/17/2022 CLINICAL DATA:  Metastatic lung cancer, pain and tenderness in the left hip EXAM: MRI PELVIS WITHOUT AND WITH CONTRAST TECHNIQUE: Multiplanar multisequence MR imaging of the pelvis was performed both before and after administration of intravenous contrast. CONTRAST:  8.18mL GADAVIST GADOBUTROL 1 MMOL/ML IV SOLN COMPARISON:  04/29/2019 FINDINGS: Bones: No hip fracture, dislocation or avascular necrosis. Stable superior pubic ramus bone lesion unchanged compared with 04/29/2019. Stable left iliac bone lesion similar in appearance to the prior examination of 04/29/2019. Stable bone lesion in the left side of the S1 vertebral body unchanged from the  prior exam. New bone lesion involving the left superior acetabulum with adjacent periosteal reaction most concerning for a new metastatic bone lesion. Normal sacrum and sacroiliac joints. No SI joint widening or erosive changes. Degenerative disease with disc height loss at L4-5. Bilateral facet arthropathy at L4-5 and L5-S1. mild broad-based disc bulge at L4-5 and L5-S1. Articular cartilage and labrum Articular cartilage: Mild partial-thickness cartilage loss of the femoral head and acetabulum bilaterally. Labrum: Grossly intact, but evaluation is limited by lack of intraarticular fluid. Joint or bursal effusion Joint effusion:  No hip joint effusion.  No SI joint effusion. Bursae:  No bursal fluid. Muscles and tendons Flexors: Normal. Extensors: Normal. Abductors: Normal. Adductors: Normal. Gluteals: Normal. Hamstrings: Normal. Other findings No pelvic free fluid. No fluid collection or hematoma. No inguinal lymphadenopathy. No inguinal hernia. IMPRESSION: 1. New bone lesion involving the left superior acetabulum with adjacent periosteal reaction most concerning for a new metastatic bone lesion. 2. Stable bone lesions in the left superior pubic ramus, left side of the S1 vertebral body and left iliac bone. 3. Mild osteoarthritis of bilateral hips. 4. Lower lumbar spine spondylosis. Electronically Signed: By: Kathreen Devoid M.D. On: 04/14/2022 08:33   CT Pelvis Wo Contrast  Result Date: 03/28/2022 CLINICAL DATA:  Lung cancer, bone Mets, pain LEFT femoral head and groin. Currently on chemotherapy for lung cancer. Sudden onset of pain  at the LEFT hip proximally 1 week ago. EXAM: CT PELVIS WITHOUT CONTRAST TECHNIQUE: Multidetector CT imaging of the pelvis was performed following the standard protocol without intravenous contrast. RADIATION DOSE REDUCTION: This exam was performed according to the departmental dose-optimization program which includes automated exposure control, adjustment of the mA and/or kV according  to patient size and/or use of iterative reconstruction technique. COMPARISON:  None Available. FINDINGS: Urinary Tract:  No abnormality visualized. Bowel: No dilated large or small bowel loops are seen within the pelvis. Diverticulosis of the sigmoid colon but no focal inflammatory change to suggest acute diverticulitis. Vascular/Lymphatic: No pathologically enlarged lymph nodes. No significant vascular abnormality seen. Reproductive: Presumed hysterectomy. No adnexal mass or free fluid is seen. Other: No free fluid or abscess collection within the pelvis. No soft tissue mass or enlarged lymph nodes are seen within the pelvis. Musculoskeletal: No acute-appearing osseous abnormality within the pelvis. No acute-appearing osseous abnormality at the LEFT hip or involving the proximal LEFT femur. Soft tissues about the LEFT hip and LEFT groin are unremarkable. Subtle lytic-appearing lesion is seen within the LEFT sacrum, without evidence of an adjacent fracture or dislocation, corresponding to an osseous metastasis better demonstrated on recent PET-CT of 11/24/2021. Additional osseous metastases is seen within the anterior acetabulum and LEFT iliac bone, without evidence of associated fracture or dislocation, as also demonstrated on the earlier PET-CT. Degenerative spondylosis of the lower lumbar spine, mild to moderate in degree. IMPRESSION: 1. No acute-appearing osseous abnormality at the LEFT hip or involving the proximal LEFT femur. No evidence of acute osseous fracture or dislocation. Soft tissues about the LEFT hip and LEFT groin are unremarkable. 2. Osseous metastases within the LEFT sacrum, LEFT iliac bone and anterior acetabulum, without evidence of associated fracture or dislocation, as also demonstrated on the earlier PET-CT of 11/24/2021. 3. Colonic diverticulosis without evidence of acute diverticulitis. 4. Degenerative spondylosis of the lower lumbar spine, mild to moderate in degree. Electronically Signed    By: Franki Cabot M.D.   On: 03/28/2022 14:16   DG Hip Unilat W or W/O Pelvis 1 View Left  Result Date: 03/27/2022 CLINICAL DATA:  Left hip pain. EXAM: DG HIP (WITH OR WITHOUT PELVIS) 1V*L* COMPARISON:  None Available. FINDINGS: There is no evidence of hip fracture or dislocation. There is no evidence of arthropathy or other focal bone abnormality. IMPRESSION: Negative. Electronically Signed   By: Marijo Conception M.D.   On: 03/27/2022 10:59   DG FEMUR 1V LEFT  Result Date: 03/27/2022 CLINICAL DATA:  Left leg pain. EXAM: LEFT FEMUR 1 VIEW COMPARISON:  None Available. FINDINGS: There is no evidence of fracture or other focal bone lesions. Soft tissues are unremarkable. IMPRESSION: Negative. Electronically Signed   By: Marijo Conception M.D.   On: 03/27/2022 10:57       IMPRESSION/PLAN: 1. 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 Dr. Lisbeth Renshaw discusses the patient's course and the consideration of re-irradiation as her prior therapy covered her acetabulum and iliac region about a year ago.  He also discusses that after looking back at her PET scan from April this year, there may be a role for additional radiotherapy to the left shoulder region. We discussed the risks, benefits, short, and long term effects of radiotherapy, as well as the palliative intent, and the patient is interested in proceeding. Dr. Lisbeth Renshaw discusses the delivery and logistics of radiotherapy and anticipates a course of 2 weeks of radiotherapy to the left  acetabulum and possibly the left shoulder region. Written consent is obtained and placed in the chart, a copy was provided to the patient. The patient will be contacted to coordinate treatment planning by our simulation department at we will wire the shoulder for simulation to determine a target for treatment in her left shoulder. 2. Painful bone metastases. She was offered a change in her medication but at this time seems  satisfied with what she's doing. We will follow this expectantly but encouraged her to let us know if she would like to revisit other therapy options.     In a visit lasting 45 minutes, greater than 50% of the time was spent face to face discussing the patient's condition, in preparation for the discussion, and coordinating the patient's care.   The above documentation reflects my direct findings during this shared patient visit. Please see the separate note by Dr. Lisbeth Renshaw on this date for the remainder of the patient's plan of care.    Carola Rhine, [facility]   **Disclaimer: This note was dictated with voice recognition software. Similar sounding words can inadvertently be transcribed and this note may contain transcription errors which may not have been corrected upon publication of note.**

## 2022-04-21 ENCOUNTER — Ambulatory Visit
Admission: RE | Admit: 2022-04-21 | Discharge: 2022-04-21 | Disposition: A | Discharge status: Home / Self Care | Payer: Medicare Other | Source: Ambulatory Visit | Attending: Radiation Oncology | Admitting: Radiation Oncology

## 2022-04-21 DIAGNOSIS — Z79899 Other long term (current) drug therapy: Secondary | ICD-10-CM | POA: Insufficient documentation

## 2022-04-21 DIAGNOSIS — Z510 Encounter for antineoplastic radiation therapy: Secondary | ICD-10-CM | POA: Insufficient documentation

## 2022-04-21 DIAGNOSIS — C7951 Secondary malignant neoplasm of bone: Secondary | ICD-10-CM | POA: Insufficient documentation

## 2022-04-21 DIAGNOSIS — Z5112 Encounter for antineoplastic immunotherapy: Secondary | ICD-10-CM | POA: Diagnosis present

## 2022-04-21 DIAGNOSIS — C3432 Malignant neoplasm of lower lobe, left bronchus or lung: Secondary | ICD-10-CM | POA: Diagnosis present

## 2022-04-21 DIAGNOSIS — Z87891 Personal history of nicotine dependence: Secondary | ICD-10-CM | POA: Insufficient documentation

## 2022-04-24 ENCOUNTER — Encounter: Payer: Self-pay | Admitting: Oncology

## 2022-04-25 DIAGNOSIS — Z510 Encounter for antineoplastic radiation therapy: Secondary | ICD-10-CM | POA: Diagnosis not present

## 2022-04-26 ENCOUNTER — Other Ambulatory Visit: Payer: Self-pay

## 2022-04-26 ENCOUNTER — Ambulatory Visit
Admission: RE | Admit: 2022-04-26 | Discharge: 2022-04-26 | Disposition: A | Discharge status: Home / Self Care | Payer: Medicare Other | Source: Ambulatory Visit | Attending: Radiation Oncology | Admitting: Radiation Oncology

## 2022-04-26 DIAGNOSIS — Z510 Encounter for antineoplastic radiation therapy: Secondary | ICD-10-CM | POA: Diagnosis not present

## 2022-04-26 LAB — RAD ONC ARIA SESSION SUMMARY

## 2022-04-27 ENCOUNTER — Ambulatory Visit
Admission: RE | Admit: 2022-04-27 | Discharge: 2022-04-27 | Disposition: A | Discharge status: Home / Self Care | Payer: Medicare Other | Source: Ambulatory Visit | Attending: Radiation Oncology | Admitting: Radiation Oncology

## 2022-04-27 ENCOUNTER — Other Ambulatory Visit: Payer: Self-pay

## 2022-04-27 DIAGNOSIS — Z510 Encounter for antineoplastic radiation therapy: Secondary | ICD-10-CM | POA: Diagnosis not present

## 2022-04-27 LAB — RAD ONC ARIA SESSION SUMMARY

## 2022-04-28 ENCOUNTER — Inpatient Hospital Stay: Payer: Medicare Other

## 2022-04-28 ENCOUNTER — Ambulatory Visit
Admission: RE | Admit: 2022-04-28 | Discharge: 2022-04-28 | Disposition: A | Discharge status: Home / Self Care | Payer: Medicare Other | Source: Ambulatory Visit | Attending: Radiation Oncology | Admitting: Radiation Oncology

## 2022-04-28 ENCOUNTER — Inpatient Hospital Stay (HOSPITAL_BASED_OUTPATIENT_CLINIC_OR_DEPARTMENT_OTHER): Payer: Medicare Other | Admitting: Oncology

## 2022-04-28 ENCOUNTER — Other Ambulatory Visit (HOSPITAL_BASED_OUTPATIENT_CLINIC_OR_DEPARTMENT_OTHER): Payer: Self-pay

## 2022-04-28 ENCOUNTER — Other Ambulatory Visit: Payer: Self-pay

## 2022-04-28 VITALS — BP 104/60 | HR 52 | Resp 18

## 2022-04-28 VITALS — BP 144/68 | HR 60 | Temp 98.2°F | Resp 20 | Ht 65.0 in | Wt 189.0 lb

## 2022-04-28 DIAGNOSIS — C3492 Malignant neoplasm of unspecified part of left bronchus or lung: Secondary | ICD-10-CM

## 2022-04-28 DIAGNOSIS — Z510 Encounter for antineoplastic radiation therapy: Secondary | ICD-10-CM | POA: Diagnosis not present

## 2022-04-28 LAB — CMP (CANCER CENTER ONLY)
ALT: 14 U/L (ref 0–44)
AST: 19 U/L (ref 15–41)
Albumin: 4.5 g/dL (ref 3.5–5.0)
Alkaline Phosphatase: 54 U/L (ref 38–126)
Anion gap: 10 (ref 5–15)
BUN: 22 mg/dL (ref 8–23)
CO2: 27 mmol/L (ref 22–32)
Calcium: 10 mg/dL (ref 8.9–10.3)
Chloride: 100 mmol/L (ref 98–111)
Creatinine: 1.03 mg/dL — ABNORMAL HIGH (ref 0.44–1.00)
GFR, Estimated: 53 mL/min — ABNORMAL LOW (ref >60)
Glucose, Bld: 101 mg/dL — ABNORMAL HIGH (ref 70–99)
Potassium: 4.1 mmol/L (ref 3.5–5.1)
Sodium: 137 mmol/L (ref 135–145)
Total Bilirubin: 0.4 mg/dL (ref 0.3–1.2)
Total Protein: 7 g/dL (ref 6.5–8.1)

## 2022-04-28 LAB — RAD ONC ARIA SESSION SUMMARY

## 2022-04-28 LAB — CBC WITH DIFFERENTIAL (CANCER CENTER ONLY)
Abs Immature Granulocytes: 0.01 10*3/uL (ref 0.00–0.07)
Basophils Absolute: 0 10*3/uL (ref 0.0–0.1)
Basophils Relative: 0 %
Eosinophils Absolute: 0 10*3/uL (ref 0.0–0.5)
Eosinophils Relative: 1 %
HCT: 33.7 % — ABNORMAL LOW (ref 36.0–46.0)
Hemoglobin: 11.1 g/dL — ABNORMAL LOW (ref 12.0–15.0)
Immature Granulocytes: 0 %
Lymphocytes Relative: 16 %
Lymphs Abs: 0.7 10*3/uL (ref 0.7–4.0)
MCH: 30.7 pg (ref 26.0–34.0)
MCHC: 32.9 g/dL (ref 30.0–36.0)
MCV: 93.4 fL (ref 80.0–100.0)
Monocytes Absolute: 0.5 10*3/uL (ref 0.1–1.0)
Monocytes Relative: 12 %
Neutro Abs: 3 10*3/uL (ref 1.7–7.7)
Neutrophils Relative %: 71 %
Platelet Count: 166 10*3/uL (ref 150–400)
RBC: 3.61 MIL/uL — ABNORMAL LOW (ref 3.87–5.11)
RDW: 13.1 % (ref 11.5–15.5)
WBC Count: 4.2 10*3/uL (ref 4.0–10.5)
nRBC: 0 % (ref 0.0–0.2)

## 2022-04-28 LAB — TSH: TSH: 0.905 u[IU]/mL (ref 0.350–4.500)

## 2022-04-28 MED ORDER — SODIUM CHLORIDE 0.9% FLUSH
10.0000 mL | INTRAVENOUS | Status: DC | PRN
  Administered 2022-04-28: 10 mL

## 2022-04-28 MED ORDER — SODIUM CHLORIDE 0.9 % IV SOLN: Freq: Once | INTRAVENOUS | Status: AC

## 2022-04-28 MED ORDER — HEPARIN SOD (PORK) LOCK FLUSH 100 UNIT/ML IV SOLN
500.0000 [IU] | Freq: Once | INTRAVENOUS | Status: AC | PRN
  Administered 2022-04-28: 500 [IU]

## 2022-04-28 MED ORDER — SODIUM CHLORIDE 0.9 % IV SOLN
200.0000 mg | Freq: Once | INTRAVENOUS | Status: AC
  Administered 2022-04-28: 200 mg via INTRAVENOUS
  Filled 2022-04-28: qty 8

## 2022-04-28 MED ORDER — INFLUENZA VAC A&B SA ADJ QUAD 0.5 ML IM PRSY
PREFILLED_SYRINGE | INTRAMUSCULAR | 0 refills | Status: DC
  Filled 2022-04-28: qty 0.5, 1d supply, fill #0

## 2022-04-28 MED ORDER — OXYCODONE HCL 5 MG PO TABS: 5.0000 mg | ORAL_TABLET | ORAL | 0 refills | Status: DC | PRN

## 2022-04-28 NOTE — Progress Notes (Signed)
[facility] OFFICE PROGRESS NOTE   Diagnosis: Non-small cell lung cancer  INTERVAL HISTORY:   [name] completed another treatment pembrolizumab on 04/07/2022.  She has developed increased pain at the left "hip ". She is taking hydrocodone/APAP and Tylenol for relief of pain.  The pain is partially relieved with this regimen. An MRI of the left hip on 04/12/2022 revealed a "new "lesion involving the left superior acetabulum and stable lesions at the left superior pubic ramus, left side of S1, and the left iliac bone.  She began palliative radiation to the left hip this week. Objective:  Vital signs in last 24 hours:  Blood pressure (!) 144/68, pulse 60, temperature 98.2 F (36.8 C), temperature source Oral, resp. rate 20, height '5\' 5"'  (1.651 m), weight 189 lb (85.7 kg), SpO2 98 %.   Resp: Inspiratory rhonchi at the lower posterior chest bilaterally, no respiratory distress Cardio: Regular rate and rhythm GI: No hepatosplenomegaly, no mass Vascular: No leg edema Musculoskeletal: Tender at the left iliac  Portacath/PICC-without erythema  Lab Results:  Lab Results  Component Value Date   WBC 4.2 04/28/2022   HGB 11.1 (L) 04/28/2022   HCT 33.7 (L) 04/28/2022   MCV 93.4 04/28/2022   PLT 166 04/28/2022   NEUTROABS 3.0 04/28/2022    CMP  Lab Results  Component Value Date   NA 137 04/28/2022   K 4.1 04/28/2022   CL 100 04/28/2022   CO2 27 04/28/2022   GLUCOSE 101 (H) 04/28/2022   BUN 22 04/28/2022   CREATININE 1.03 (H) 04/28/2022   CALCIUM 10.0 04/28/2022   PROT 7.0 04/28/2022   ALBUMIN 4.5 04/28/2022   AST 19 04/28/2022   ALT 14 04/28/2022   ALKPHOS 54 04/28/2022   BILITOT 0.4 04/28/2022   GFRNONAA 53 (L) 04/28/2022   GFRAA >60 04/21/2020    No results found for: "CEA1", "CEA", "WGN562", "CA125"  Lab Results  Component Value Date   INR 1.0 07/12/2021   LABPROT 12.7 07/12/2021    Imaging:  No results found.  Medications: I have  reviewed the patient's current medications.   Assessment/Plan: Non-small cell lung cancer MRI lumbar spine 04/29/2019- enlarging marrow lesions involving the L1 vertebral body, upper left sacrum and right iliac bone MRI pelvis 04/29/2019- 3.5 cm left iliac bone lesion appears slightly larger; other similar appearing lesions present within the left superior pubic ramus, left superior abdomen acetabulum and upper left sacrum Kappa free light chains with mild elevation 05/12/2019  CTs 05/12/2019- left lower lobe pulmonary mass 3.3 x 3.2 cm; lytic process left iliac bone; spinal lesions; 1.1 cm low-density left kidney lesion; right thyroid enlargement with heterogeneous appearance with potential for multiple discrete lesions Biopsy left lower lobe lung mass 05/26/2019-poorly differentiated carcinoma; positive for cytokeratin 5/6, p63 and TTF-1, no EGFR, BRAF, ALK, ERBB2,ROS, or NTRK alteration Cycle 1 carboplatin/Alimta/pembrolizumab 06/06/2019 Cycle 2 carboplatin/Alimta/pembrolizumab 06/27/2019 Cycle 3 carboplatin/Alimta/pembrolizumab 07/18/2019 Cycle 4 carboplatin/Alimta/pembrolizumab 08/07/2019 CTs 08/27/2019-significant decrease in size of lobulated mass left lower lobe.  Unchanged appearance of subtle bone lesions. Cycle 5 Alimta/pembrolizumab 08/28/2019 Cycle 6 Alimta/pembrolizumab 09/18/2019 Cycle 7 Alimta/pembrolizumab 10/09/2019 Cycle 8 Alimta/pembrolizumab 10/30/2019 Cycle 9 Alimta/pembrolizumab 11/20/2019 CTs 12/09/2019-no evidence of disease progression, left lower lobe nodule slightly decreased in size, stable L1, left sacral, and left pubic ramus metastases Cycle 10 Alimta/pembrolizumab 12/11/2019 Cycle 11 pembrolizumab alone 01/02/2020 (Alimta held due to edema, tenderness, erythema at the lower legs) Cycle 12 pembrolizumab 01/23/2020 Cycle 13 pembrolizumab 02/12/2020 Cycle 14 pembrolizumab 03/04/2020 CTs 03/18/2020-stable left lower  lobe lesion, mild sclerosis at the superior endplate of L1 that was  previously hypermetabolic, stable small left upper sacral lucent lesion, previous left superior pubic ramus lesion is occult on the CT, CT head negative for malignancy Cycle 15 pembrolizumab 03/25/2020 Cycle 16 pembrolizumab 04/21/2020 Cycle 17 pembrolizumab 05/17/2020 05/19/2020 bone scan-no definite abnormalities to suggest osseous metastases.  Areas of concern on prior PET-CT involving left iliac bone and L1 vertebral body showed no abnormalities on the current study Cycle 18 Pembrolizumab 06/10/2020 Cycle 19 Pembrolizumab 06/30/2020 CTs 07/16/2020-stable left lower lobe nodule, stable faint superior L1 vertebral lesion, no evidence of disease progression Cycle 20 pembrolizumab 07/21/2020 Cycle 21 Pembrolizumab 08/11/2020 Cycle 22 pembrolizumab 09/01/2020 Cycle 23 Pembrolizumab 09/22/2020 Cycle 24 pembrolizumab 10/13/2020 Cycle 25 Pembrolizumab 11/03/2020 Cycle 26 pembrolizumab 11/26/2020 CTs 12/15/2020- stable left lower lobe mass, stable sclerotic lesion at L2, no evidence of disease progression Cycle 27 pembrolizumab 12/17/2020 PET scan 01/03/2021-1.8 x 1.3 cm left lower lobe nodule similar in size to CT of 12/15/2020 and measures smaller than previous PET/CT from 2020.  Nodule is markedly hypermetabolic.  No evidence for hypermetabolic hilar or mediastinal lymphadenopathy.  Several tiny foci of hypermetabolism identified in bony anatomy raising concern for skeletal metastases.  Comparison of the PET to CT 07/16/2020-lobular left lower lobe pulmonary nodule measured 2.4 x 1.5 cm on the prior study, current study it measured 1.8 x 1.3 cm.  Lesion in the anterior left acetabulum is similar to the 07/16/2020 exam although overlying cortical thinning slightly more pronounced on the current study.  Described lesion in the scapula shows some cortical sclerosis and a tiny central marrow lucency not substantially changed compared to 07/16/2020.  Left third rib lesion shows heterogeneous mineralization similar to  07/16/2020. MRI of cervical spine 01/13/2021-increased left facet edema at C5-C6, severe facet arthrosis on the left at C7-T1 and on the right at C3-C4, no evidence of metastatic disease Cycle 28 Pembrolizumab 01/14/2021 Cycle 29 Pembrolizumab 02/03/2021 Cycle 30 Pembrolizumab 02/24/2021 MRI left hip 03/02/2021-compared to 04/29/2019, slight increase in size of metastases at the left iliac and left acetabulum.  Lesion at the left upper sacrum is less conspicuous and a lesion at the left superior pubic ramus is stable Cycle 31 pembrolizumab 03/17/2021 Radiation to left acetabulum and left iliac 03/30/2021-04/13/2021 Cycle 32 pembrolizumab 04/07/2021 Cycle 33 Pembrolizumab 04/28/2021 Cycle 34 pembrolizumab 05/18/2021 PET scan 06/01/2021-no significant change in size or degree of FDG uptake associated with FDG avid left lower lobe lung nodule compatible with neoplasm.  Stable to improved appearance of multifocal FDG avid bone metastasis. Cycle 35 pembrolizumab 06/10/2021 Cycle 36 pembrolizumab 07/01/2021 Cycle 37 pembrolizumab 07/29/2021 Cycle 38 pembrolizumab 08/19/2021 Cycle 39 Pembrolizumab 09/09/2021 Cycle 40 pembrolizumab 09/30/2021 CT chest 10/20/2021 to evaluate complaints of chest pain-no PE.  Interval increase in left lower lobe mass. Cycle 41 Pembrolizumab 10/21/2021 Cycle 42 pembrolizumab 11/11/2021 PET 11/24/2021-mild increase in size of left lower lobe mass, no evidence of solid organ or nodal metastases, stable multifocal bone metastases.  No new sites of metastatic disease. Cycle 43 pembrolizumab 11/29/2021 Cycle 45 Pembrolizumab 12/20/2021 Cycle 46 Pembrolizumab 01/09/2022 Cycle 47 pembrolizumab 02/01/2022 Cycle 48 Pembrolizumab 02/24/2022 Cycle 49 Pembrolizumab 03/17/2022 MRI l pelvis 04/12/2022-"new "lesion at the left superior acetabulum, stable bone lesions at the left suprapubic ramus, left S1, and left iliac Palliative radiation to the left hip 04/26/2022 Pain secondary to #1, improved Chronic back  pain Type 2 diabetes Essential hypertension CAD Hyperlipidemia Family history significant for multiple members with breast cancer Grade 1 skin rash 07/18/2019  likely related to immunotherapy.  Topical steroid cream as needed. E. coli urinary tract infection 07/14/2019.  Completed cephalexin. Edema/tenderness at the right greater than left ankle 10/21/2019-etiology unclear, potentially related to systemic therapy or an infection, doxycycline prescribed-improved 10/23/2019; marked improvement 11/20/2019; at office visit 01/02/2020 she reports worsening of lower extremity edema, pain/tenderness, erythema 3 to 4 days following each treatment.  Alimta held 01/02/2020.  Referral to dermatology. COVID-19 infection 03/30/2020, monoclonal antibody therapy 04/06/2020 Arthritis, possibly related to Pembrolizumab, improved since beginning Medrol, now followed by Dr. Amil Amen Pain left femoral head/upper femur/groin-negative plain x-ray; CT pelvis 03/28/2022-no acute appearing bone abnormality at the left hip or involving the proximal left femur, no evidence of acute fracture or dislocation, soft tissues left hip and left groin unremarkable; osseous mets within the left sacrum, left iliac bone and anterior acetabulum without evidence of associated fracture or dislocation.     Disposition: [name] appears unchanged.  She has increased pain at the left hip and a "new "lesion was noted on a pelvic MRI.  A left acetabular lesion was also noted on MRI in August 2022.  I suspect the lesion is not new.  She will complete the course of palliative radiation.  We decided to continue pembrolizumab.  She will complete another treatment today.  She will return for an office visit in 3 weeks.  Her pain is partially controlled with hydrocodone/APAP and she has been using extra Tylenol.  We changed the narcotic to oxycodone today.  [name], MD  04/28/2022  11:40 AM

## 2022-04-28 NOTE — Progress Notes (Signed)
Labs and VS are within treatment parameters. Per Dr. Benay Spice, patient is ok to proceed with treatment.

## 2022-04-28 NOTE — Patient Instructions (Addendum)
[facility]   Discharge Instructions: Thank you for choosing [facility] to provide your oncology and hematology care.   If you have a lab appointment with the [facility], please go directly to the [facility] and check in at the registration area.   Wear comfortable clothing and clothing appropriate for easy access to any Portacath or PICC line.   We strive to give you quality time with your provider. You may need to reschedule your appointment if you arrive late (15 or more minutes).  Arriving late affects you and other patients whose appointments are after yours.  Also, if you miss three or more appointments without notifying the office, you may be dismissed from the clinic at the provider's discretion.      For prescription refill requests, have your pharmacy contact our office and allow 72 hours for refills to be completed.    Today you received the following chemotherapy and/or immunotherapy agents Pembrolizumab (KEYTRUDA).      To help prevent nausea and vomiting after your treatment, we encourage you to take your nausea medication as directed.  BELOW ARE SYMPTOMS THAT SHOULD BE REPORTED IMMEDIATELY: *FEVER GREATER THAN 100.4 F (38 C) OR HIGHER *CHILLS OR SWEATING *NAUSEA AND VOMITING THAT IS NOT CONTROLLED WITH YOUR NAUSEA MEDICATION *UNUSUAL SHORTNESS OF BREATH *UNUSUAL BRUISING OR BLEEDING *URINARY PROBLEMS (pain or burning when urinating, or frequent urination) *BOWEL PROBLEMS (unusual diarrhea, constipation, pain near the anus) TENDERNESS IN MOUTH AND THROAT WITH OR WITHOUT PRESENCE OF ULCERS (sore throat, sores in mouth, or a toothache) UNUSUAL RASH, SWELLING OR PAIN  UNUSUAL VAGINAL DISCHARGE OR ITCHING   Items with * indicate a potential emergency and should be followed up as soon as possible or go to the Emergency Department if any problems should occur.  Please show the CHEMOTHERAPY ALERT CARD or IMMUNOTHERAPY ALERT CARD  at check-in to the Emergency Department and triage nurse.  Should you have questions after your visit or need to cancel or reschedule your appointment, please contact [facility]  Dept: [phone]  and follow the prompts.  Office hours are 8:00 a.m. to 4:30 p.m. Monday - Friday. Please note that voicemails left after 4:00 p.m. may not be returned until the following business day.  We are closed weekends and major holidays. You have access to a nurse at all times for urgent questions. Please call the main number to the clinic Dept: [phone] and follow the prompts.   For any non-urgent questions, you may also contact your provider using MyChart. We now offer e-Visits for anyone 50 and older to request care online for non-urgent symptoms. For details visit [email or web address].   Also download the MyChart app! Go to the app store, search "MyChart", open the app, select [facility], and log in with your MyChart username and password.  Masks are optional in the cancer centers. If you would like for your care team to wear a mask while they are taking care of you, please let them know. You may have one support person who is at least 85 years old accompany you for your appointments.  Pembrolizumab Injection What is this medication? PEMBROLIZUMAB (PEM broe LIZ ue mab) treats some types of cancer. It works by helping your immune system slow or stop the spread of cancer cells. It is a monoclonal antibody. This medicine may be used for other purposes; ask your health care provider or pharmacist if you have questions. COMMON BRAND NAME(S):  Keytruda What should I tell my care team before I take this medication? They need to know if you have any of these conditions: Allogeneic stem cell transplant (uses someone else's stem cells) Autoimmune diseases, such as Crohn disease, ulcerative colitis, lupus History of chest radiation Nervous system problems, such as Guillain-Barre  syndrome, myasthenia gravis Organ transplant An unusual or allergic reaction to pembrolizumab, other medications, foods, dyes, or preservatives Pregnant or trying to get pregnant Breast-feeding How should I use this medication? This medication is injected into a vein. It is given by your care team in a hospital or clinic setting. A special MedGuide will be given to you before each treatment. Be sure to read this information carefully each time. Talk to your care team about the use of this medication in children. While it may be prescribed for children as young as 6 months for selected conditions, precautions do apply. Overdosage: If you think you have taken too much of this medicine contact a poison control center or emergency room at once. NOTE: This medicine is only for you. Do not share this medicine with others. What if I miss a dose? Keep appointments for follow-up doses. It is important not to miss your dose. Call your care team if you are unable to keep an appointment. What may interact with this medication? Interactions have not been studied. This list may not describe all possible interactions. Give your health care provider a list of all the medicines, herbs, non-prescription drugs, or dietary supplements you use. Also tell them if you smoke, drink alcohol, or use illegal drugs. Some items may interact with your medicine. What should I watch for while using this medication? Your condition will be monitored carefully while you are receiving this medication. You may need blood work while taking this medication. This medication may cause serious skin reactions. They can happen weeks to months after starting the medication. Contact your care team right away if you notice fevers or flu-like symptoms with a rash. The rash may be red or purple and then turn into blisters or peeling of the skin. You may also notice a red rash with swelling of the face, lips, or lymph nodes in your neck or under  your arms. Tell your care team right away if you have any change in your eyesight. Talk to your care team if you may be pregnant. Serious birth defects can occur if you take this medication during pregnancy and for 4 months after the last dose. You will need a negative pregnancy test before starting this medication. Contraception is recommended while taking this medication and for 4 months after the last dose. Your care team can help you find the option that works for you. Do not breastfeed while taking this medication and for 4 months after the last dose. What side effects may I notice from receiving this medication? Side effects that you should report to your care team as soon as possible: Allergic reactions--skin rash, itching, hives, swelling of the face, lips, tongue, or throat Dry cough, shortness of breath or trouble breathing Eye pain, redness, irritation, or discharge with blurry or decreased vision Heart muscle inflammation--unusual weakness or fatigue, shortness of breath, chest pain, fast or irregular heartbeat, dizziness, swelling of the ankles, feet, or hands Hormone gland problems--headache, sensitivity to light, unusual weakness or fatigue, dizziness, fast or irregular heartbeat, increased sensitivity to cold or heat, excessive sweating, constipation, hair loss, increased thirst or amount of urine, tremors or shaking, irritability Infusion reactions--chest pain, shortness  of breath or trouble breathing, feeling faint or lightheaded Kidney injury (glomerulonephritis)--decrease in the amount of urine, red or dark brown urine, foamy or bubbly urine, swelling of the ankles, hands, or feet Liver injury--right upper belly pain, loss of appetite, nausea, light-colored stool, dark yellow or brown urine, yellowing skin or eyes, unusual weakness or fatigue Pain, tingling, or numbness in the hands or feet, muscle weakness, change in vision, confusion or trouble speaking, loss of balance or  coordination, trouble walking, seizures Rash, fever, and swollen lymph nodes Redness, blistering, peeling, or loosening of the skin, including inside the mouth Sudden or severe stomach pain, bloody diarrhea, fever, nausea, vomiting Side effects that usually do not require medical attention (report to your care team if they continue or are bothersome): Bone, joint, or muscle pain Diarrhea Fatigue Loss of appetite Nausea Skin rash This list may not describe all possible side effects. Call your doctor for medical advice about side effects. You may report side effects to FDA at 1-800-FDA-1088. Where should I keep my medication? This medication is given in a hospital or clinic. It will not be stored at home. NOTE: This sheet is a summary. It may not cover all possible information. If you have questions about this medicine, talk to your doctor, pharmacist, or health care provider.  2023 Elsevier/Gold Standard (2021-11-07 0

## 2022-04-29 ENCOUNTER — Other Ambulatory Visit: Payer: Self-pay | Admitting: Cardiovascular Disease

## 2022-04-29 ENCOUNTER — Other Ambulatory Visit: Payer: Self-pay

## 2022-05-01 ENCOUNTER — Other Ambulatory Visit: Payer: Self-pay

## 2022-05-01 ENCOUNTER — Ambulatory Visit
Admission: RE | Admit: 2022-05-01 | Discharge: 2022-05-01 | Disposition: A | Discharge status: Home / Self Care | Payer: Medicare Other | Source: Ambulatory Visit | Attending: Radiation Oncology | Admitting: Radiation Oncology

## 2022-05-01 DIAGNOSIS — C3432 Malignant neoplasm of lower lobe, left bronchus or lung: Secondary | ICD-10-CM | POA: Insufficient documentation

## 2022-05-01 DIAGNOSIS — Z87891 Personal history of nicotine dependence: Secondary | ICD-10-CM | POA: Insufficient documentation

## 2022-05-01 DIAGNOSIS — Z79899 Other long term (current) drug therapy: Secondary | ICD-10-CM | POA: Diagnosis not present

## 2022-05-01 DIAGNOSIS — C7951 Secondary malignant neoplasm of bone: Secondary | ICD-10-CM | POA: Insufficient documentation

## 2022-05-01 DIAGNOSIS — Z510 Encounter for antineoplastic radiation therapy: Secondary | ICD-10-CM | POA: Insufficient documentation

## 2022-05-01 DIAGNOSIS — Z5112 Encounter for antineoplastic immunotherapy: Secondary | ICD-10-CM | POA: Insufficient documentation

## 2022-05-01 LAB — RAD ONC ARIA SESSION SUMMARY

## 2022-05-02 ENCOUNTER — Other Ambulatory Visit: Payer: Self-pay

## 2022-05-02 ENCOUNTER — Ambulatory Visit
Admission: RE | Admit: 2022-05-02 | Discharge: 2022-05-02 | Disposition: A | Discharge status: Home / Self Care | Payer: Medicare Other | Source: Ambulatory Visit | Attending: Radiation Oncology | Admitting: Radiation Oncology

## 2022-05-02 DIAGNOSIS — Z510 Encounter for antineoplastic radiation therapy: Secondary | ICD-10-CM | POA: Diagnosis not present

## 2022-05-02 LAB — RAD ONC ARIA SESSION SUMMARY

## 2022-05-03 ENCOUNTER — Other Ambulatory Visit: Payer: Self-pay

## 2022-05-03 ENCOUNTER — Ambulatory Visit
Admission: RE | Admit: 2022-05-03 | Discharge: 2022-05-03 | Disposition: A | Discharge status: Home / Self Care | Payer: Medicare Other | Source: Ambulatory Visit | Attending: Radiation Oncology | Admitting: Radiation Oncology

## 2022-05-03 ENCOUNTER — Other Ambulatory Visit (HOSPITAL_BASED_OUTPATIENT_CLINIC_OR_DEPARTMENT_OTHER): Payer: Self-pay

## 2022-05-03 DIAGNOSIS — Z510 Encounter for antineoplastic radiation therapy: Secondary | ICD-10-CM | POA: Diagnosis not present

## 2022-05-03 LAB — RAD ONC ARIA SESSION SUMMARY

## 2022-05-03 MED ORDER — AREXVY 120 MCG/0.5ML IM SUSR
INTRAMUSCULAR | 0 refills | Status: DC
  Filled 2022-05-03: qty 0.5, 1d supply, fill #0

## 2022-05-04 ENCOUNTER — Other Ambulatory Visit: Payer: Self-pay

## 2022-05-04 ENCOUNTER — Ambulatory Visit
Admission: RE | Admit: 2022-05-04 | Discharge: 2022-05-04 | Disposition: A | Discharge status: Home / Self Care | Payer: Medicare Other | Source: Ambulatory Visit | Attending: Radiation Oncology | Admitting: Radiation Oncology

## 2022-05-04 DIAGNOSIS — Z510 Encounter for antineoplastic radiation therapy: Secondary | ICD-10-CM | POA: Diagnosis not present

## 2022-05-04 LAB — RAD ONC ARIA SESSION SUMMARY

## 2022-05-05 ENCOUNTER — Ambulatory Visit
Admission: RE | Admit: 2022-05-05 | Discharge: 2022-05-05 | Disposition: A | Discharge status: Home / Self Care | Payer: Medicare Other | Source: Ambulatory Visit | Attending: Radiation Oncology | Admitting: Radiation Oncology

## 2022-05-05 ENCOUNTER — Other Ambulatory Visit: Payer: Self-pay

## 2022-05-05 DIAGNOSIS — Z510 Encounter for antineoplastic radiation therapy: Secondary | ICD-10-CM | POA: Diagnosis not present

## 2022-05-05 LAB — RAD ONC ARIA SESSION SUMMARY
Course Elapsed Days: 9
Plan Fractions Treated to Date: 8
Plan Fractions Treated to Date: 8
Plan Prescribed Dose Per Fraction: 3 Gy
Plan Prescribed Dose Per Fraction: 3 Gy
Plan Total Fractions Prescribed: 10
Plan Total Fractions Prescribed: 10
Plan Total Prescribed Dose: 30 Gy
Plan Total Prescribed Dose: 30 Gy
Reference Point Dosage Given to Date: 24 Gy
Reference Point Dosage Given to Date: 24 Gy
Reference Point Session Dosage Given: 3 Gy
Reference Point Session Dosage Given: 3 Gy
Session Number: 8

## 2022-05-08 ENCOUNTER — Ambulatory Visit
Admission: RE | Admit: 2022-05-08 | Discharge: 2022-05-08 | Disposition: A | Discharge status: Home / Self Care | Payer: Medicare Other | Source: Ambulatory Visit | Attending: Radiation Oncology | Admitting: Radiation Oncology

## 2022-05-08 ENCOUNTER — Other Ambulatory Visit: Payer: Self-pay

## 2022-05-08 DIAGNOSIS — Z510 Encounter for antineoplastic radiation therapy: Secondary | ICD-10-CM | POA: Diagnosis not present

## 2022-05-08 LAB — RAD ONC ARIA SESSION SUMMARY
Course Elapsed Days: 12
Plan Fractions Treated to Date: 9
Plan Fractions Treated to Date: 9
Plan Prescribed Dose Per Fraction: 3 Gy
Plan Prescribed Dose Per Fraction: 3 Gy
Plan Total Fractions Prescribed: 10
Plan Total Fractions Prescribed: 10
Plan Total Prescribed Dose: 30 Gy
Plan Total Prescribed Dose: 30 Gy
Reference Point Dosage Given to Date: 27 Gy
Reference Point Dosage Given to Date: 27 Gy
Reference Point Session Dosage Given: 3 Gy
Reference Point Session Dosage Given: 3 Gy
Session Number: 9

## 2022-05-09 ENCOUNTER — Other Ambulatory Visit: Payer: Self-pay

## 2022-05-09 ENCOUNTER — Encounter: Payer: Self-pay | Admitting: Radiation Oncology

## 2022-05-09 ENCOUNTER — Ambulatory Visit: Payer: Medicare Other

## 2022-05-09 ENCOUNTER — Ambulatory Visit
Admission: RE | Admit: 2022-05-09 | Discharge: 2022-05-09 | Disposition: A | Discharge status: Home / Self Care | Payer: Medicare Other | Source: Ambulatory Visit | Attending: Radiation Oncology | Admitting: Radiation Oncology

## 2022-05-09 DIAGNOSIS — Z510 Encounter for antineoplastic radiation therapy: Secondary | ICD-10-CM | POA: Diagnosis not present

## 2022-05-09 LAB — RAD ONC ARIA SESSION SUMMARY
Course Elapsed Days: 13
Plan Fractions Treated to Date: 10
Plan Fractions Treated to Date: 10
Plan Prescribed Dose Per Fraction: 3 Gy
Plan Prescribed Dose Per Fraction: 3 Gy
Plan Total Fractions Prescribed: 10
Plan Total Fractions Prescribed: 10
Plan Total Prescribed Dose: 30 Gy
Plan Total Prescribed Dose: 30 Gy
Reference Point Dosage Given to Date: 30 Gy
Reference Point Dosage Given to Date: 30 Gy
Reference Point Session Dosage Given: 3 Gy
Reference Point Session Dosage Given: 3 Gy
Session Number: 10

## 2022-05-13 ENCOUNTER — Other Ambulatory Visit: Payer: Self-pay

## 2022-05-14 ENCOUNTER — Other Ambulatory Visit: Payer: Self-pay | Admitting: Oncology

## 2022-05-18 ENCOUNTER — Inpatient Hospital Stay: Payer: Medicare Other

## 2022-05-18 ENCOUNTER — Inpatient Hospital Stay: Payer: Medicare Other | Attending: Oncology

## 2022-05-18 ENCOUNTER — Other Ambulatory Visit: Payer: Self-pay | Admitting: Nurse Practitioner

## 2022-05-18 ENCOUNTER — Inpatient Hospital Stay (HOSPITAL_BASED_OUTPATIENT_CLINIC_OR_DEPARTMENT_OTHER): Payer: Medicare Other | Admitting: Oncology

## 2022-05-18 VITALS — BP 142/57 | HR 57 | Temp 98.1°F | Resp 18 | Ht 65.0 in | Wt 187.0 lb

## 2022-05-18 VITALS — BP 115/44 | HR 60 | Resp 20

## 2022-05-18 DIAGNOSIS — C3492 Malignant neoplasm of unspecified part of left bronchus or lung: Secondary | ICD-10-CM

## 2022-05-18 DIAGNOSIS — C3432 Malignant neoplasm of lower lobe, left bronchus or lung: Secondary | ICD-10-CM | POA: Insufficient documentation

## 2022-05-18 DIAGNOSIS — C7951 Secondary malignant neoplasm of bone: Secondary | ICD-10-CM | POA: Diagnosis present

## 2022-05-18 DIAGNOSIS — Z5112 Encounter for antineoplastic immunotherapy: Secondary | ICD-10-CM | POA: Diagnosis present

## 2022-05-18 LAB — CMP (CANCER CENTER ONLY)
ALT: 15 U/L (ref 0–44)
AST: 18 U/L (ref 15–41)
Albumin: 4.5 g/dL (ref 3.5–5.0)
Alkaline Phosphatase: 55 U/L (ref 38–126)
Anion gap: 7 (ref 5–15)
BUN: 24 mg/dL — ABNORMAL HIGH (ref 8–23)
CO2: 27 mmol/L (ref 22–32)
Calcium: 10.3 mg/dL (ref 8.9–10.3)
Chloride: 98 mmol/L (ref 98–111)
Creatinine: 0.92 mg/dL (ref 0.44–1.00)
GFR, Estimated: >60 mL/min (ref >60)
Glucose, Bld: 118 mg/dL — ABNORMAL HIGH (ref 70–99)
Potassium: 4.2 mmol/L (ref 3.5–5.1)
Sodium: 132 mmol/L — ABNORMAL LOW (ref 135–145)
Total Bilirubin: 0.4 mg/dL (ref 0.3–1.2)
Total Protein: 6.9 g/dL (ref 6.5–8.1)

## 2022-05-18 LAB — CBC WITH DIFFERENTIAL (CANCER CENTER ONLY)
Abs Immature Granulocytes: 0.02 10*3/uL (ref 0.00–0.07)
Basophils Absolute: 0 10*3/uL (ref 0.0–0.1)
Basophils Relative: 0 %
Eosinophils Absolute: 0.1 10*3/uL (ref 0.0–0.5)
Eosinophils Relative: 1 %
HCT: 35.1 % — ABNORMAL LOW (ref 36.0–46.0)
Hemoglobin: 11.4 g/dL — ABNORMAL LOW (ref 12.0–15.0)
Immature Granulocytes: 0 %
Lymphocytes Relative: 10 %
Lymphs Abs: 0.5 10*3/uL — ABNORMAL LOW (ref 0.7–4.0)
MCH: 30.2 pg (ref 26.0–34.0)
MCHC: 32.5 g/dL (ref 30.0–36.0)
MCV: 93.1 fL (ref 80.0–100.0)
Monocytes Absolute: 0.5 10*3/uL (ref 0.1–1.0)
Monocytes Relative: 9 %
Neutro Abs: 4.3 10*3/uL (ref 1.7–7.7)
Neutrophils Relative %: 80 %
Platelet Count: 157 10*3/uL (ref 150–400)
RBC: 3.77 MIL/uL — ABNORMAL LOW (ref 3.87–5.11)
RDW: 13.5 % (ref 11.5–15.5)
WBC Count: 5.4 10*3/uL (ref 4.0–10.5)
nRBC: 0 % (ref 0.0–0.2)

## 2022-05-18 MED ORDER — HYDROCODONE-ACETAMINOPHEN 10-325 MG PO TABS: 1.0000 | ORAL_TABLET | ORAL | 0 refills | Status: DC | PRN

## 2022-05-18 MED ORDER — SODIUM CHLORIDE 0.9 % IV SOLN: Freq: Once | INTRAVENOUS | Status: AC

## 2022-05-18 MED ORDER — SODIUM CHLORIDE 0.9% FLUSH
10.0000 mL | INTRAVENOUS | Status: DC | PRN
  Administered 2022-05-18: 10 mL

## 2022-05-18 MED ORDER — HEPARIN SOD (PORK) LOCK FLUSH 100 UNIT/ML IV SOLN
500.0000 [IU] | Freq: Once | INTRAVENOUS | Status: AC | PRN
  Administered 2022-05-18: 500 [IU]

## 2022-05-18 MED ORDER — SODIUM CHLORIDE 0.9 % IV SOLN
200.0000 mg | Freq: Once | INTRAVENOUS | Status: AC
  Administered 2022-05-18: 200 mg via INTRAVENOUS
  Filled 2022-05-18: qty 8

## 2022-05-18 NOTE — Progress Notes (Signed)
[place] OFFICE PROGRESS NOTE   Diagnosis: Non-small cell lung cancer  INTERVAL HISTORY:   [name] returns as scheduled.  She continues every 3-week pembrolizumab.  She completed the last cycle on 04/28/2022. She began palliative radiation to the left acetabulum on 04/26/2022.  He completed radiation 05/09/2022.  She continues to have severe pain at the left hip.  The pain is worse with movement and weightbearing.  Oxycodone made her feel dizzy.  She takes hydrocodone and extra-strength Tylenol several times per day.  She has erythematous lesions on the arms.  She also received radiation to the left scapula.  The "shoulder "pain has improved.   Objective:  Vital signs in last 24 hours:  Blood pressure (!) 142/57, pulse (!) 57, temperature 98.1 F (36.7 C), temperature source Oral, resp. rate 18, height 5' 5" (1.651 m), weight 187 lb (84.8 kg), SpO2 99 %.    HEENT: No thrush or ulcers Resp: Lungs with end inspiratory rhonchi at the left lower posterior chest Cardio: Regular rate and rhythm GI: No hepatosplenomegaly, nontender Vascular: No leg edema Musculoskeletal: Pain with motion of the left hip.  Tender at the left trochanter. Skin: Few slightly raised erythematous lesions over the arms, appear to be healing  Portacath/PICC-without erythema  Lab Results:  Lab Results  Component Value Date   WBC 5.4 05/18/2022   HGB 11.4 (L) 05/18/2022   HCT 35.1 (L) 05/18/2022   MCV 93.1 05/18/2022   PLT 157 05/18/2022   NEUTROABS 4.3 05/18/2022    CMP  Lab Results  Component Value Date   NA 132 (L) 05/18/2022   K 4.2 05/18/2022   CL 98 05/18/2022   CO2 27 05/18/2022   GLUCOSE 118 (H) 05/18/2022   BUN 24 (H) 05/18/2022   CREATININE 0.92 05/18/2022   CALCIUM 10.3 05/18/2022   PROT 6.9 05/18/2022   ALBUMIN 4.5 05/18/2022   AST 18 05/18/2022   ALT 15 05/18/2022   ALKPHOS 55 05/18/2022   BILITOT 0.4 05/18/2022   GFRNONAA >60 05/18/2022   GFRAA >60  04/21/2020    No results found for: "CEA1", "CEA", "EUM353", "CA125"  Lab Results  Component Value Date   INR 1.0 07/12/2021   LABPROT 12.7 07/12/2021    Imaging:  No results found.  Medications: I have reviewed the patient's current medications.   Assessment/Plan: Non-small cell lung cancer MRI lumbar spine 04/29/2019- enlarging marrow lesions involving the L1 vertebral body, upper left sacrum and right iliac bone MRI pelvis 04/29/2019- 3.5 cm left iliac bone lesion appears slightly larger; other similar appearing lesions present within the left superior pubic ramus, left superior abdomen acetabulum and upper left sacrum Kappa free light chains with mild elevation 05/12/2019  CTs 05/12/2019- left lower lobe pulmonary mass 3.3 x 3.2 cm; lytic process left iliac bone; spinal lesions; 1.1 cm low-density left kidney lesion; right thyroid enlargement with heterogeneous appearance with potential for multiple discrete lesions Biopsy left lower lobe lung mass 05/26/2019-poorly differentiated carcinoma; positive for cytokeratin 5/6, p63 and TTF-1, no EGFR, BRAF, ALK, ERBB2,ROS, or NTRK alteration Cycle 1 carboplatin/Alimta/pembrolizumab 06/06/2019 Cycle 2 carboplatin/Alimta/pembrolizumab 06/27/2019 Cycle 3 carboplatin/Alimta/pembrolizumab 07/18/2019 Cycle 4 carboplatin/Alimta/pembrolizumab 08/07/2019 CTs 08/27/2019-significant decrease in size of lobulated mass left lower lobe.  Unchanged appearance of subtle bone lesions. Cycle 5 Alimta/pembrolizumab 08/28/2019 Cycle 6 Alimta/pembrolizumab 09/18/2019 Cycle 7 Alimta/pembrolizumab 10/09/2019 Cycle 8 Alimta/pembrolizumab 10/30/2019 Cycle 9 Alimta/pembrolizumab 11/20/2019 CTs 12/09/2019-no evidence of disease progression, left lower lobe nodule slightly decreased in size, stable L1, left sacral, and left  pubic ramus metastases Cycle 10 Alimta/pembrolizumab 12/11/2019 Cycle 11 pembrolizumab alone 01/02/2020 (Alimta held due to edema, tenderness, erythema at  the lower legs) Cycle 12 pembrolizumab 01/23/2020 Cycle 13 pembrolizumab 02/12/2020 Cycle 14 pembrolizumab 03/04/2020 CTs 03/18/2020-stable left lower lobe lesion, mild sclerosis at the superior endplate of L1 that was previously hypermetabolic, stable small left upper sacral lucent lesion, previous left superior pubic ramus lesion is occult on the CT, CT head negative for malignancy Cycle 15 pembrolizumab 03/25/2020 Cycle 16 pembrolizumab 04/21/2020 Cycle 17 pembrolizumab 05/17/2020 05/19/2020 bone scan-no definite abnormalities to suggest osseous metastases.  Areas of concern on prior PET-CT involving left iliac bone and L1 vertebral body showed no abnormalities on the current study Cycle 18 Pembrolizumab 06/10/2020 Cycle 19 Pembrolizumab 06/30/2020 CTs 07/16/2020-stable left lower lobe nodule, stable faint superior L1 vertebral lesion, no evidence of disease progression Cycle 20 pembrolizumab 07/21/2020 Cycle 21 Pembrolizumab 08/11/2020 Cycle 22 pembrolizumab 09/01/2020 Cycle 23 Pembrolizumab 09/22/2020 Cycle 24 pembrolizumab 10/13/2020 Cycle 25 Pembrolizumab 11/03/2020 Cycle 26 pembrolizumab 11/26/2020 CTs 12/15/2020- stable left lower lobe mass, stable sclerotic lesion at L2, no evidence of disease progression Cycle 27 pembrolizumab 12/17/2020 PET scan 01/03/2021-1.8 x 1.3 cm left lower lobe nodule similar in size to CT of 12/15/2020 and measures smaller than previous PET/CT from 2020.  Nodule is markedly hypermetabolic.  No evidence for hypermetabolic hilar or mediastinal lymphadenopathy.  Several tiny foci of hypermetabolism identified in bony anatomy raising concern for skeletal metastases.  Comparison of the PET to CT 07/16/2020-lobular left lower lobe pulmonary nodule measured 2.4 x 1.5 cm on the prior study, current study it measured 1.8 x 1.3 cm.  Lesion in the anterior left acetabulum is similar to the 07/16/2020 exam although overlying cortical thinning slightly more pronounced on the current study.   Described lesion in the scapula shows some cortical sclerosis and a tiny central marrow lucency not substantially changed compared to 07/16/2020.  Left third rib lesion shows heterogeneous mineralization similar to 07/16/2020. MRI of cervical spine 01/13/2021-increased left facet edema at C5-C6, severe facet arthrosis on the left at C7-T1 and on the right at C3-C4, no evidence of metastatic disease Cycle 28 Pembrolizumab 01/14/2021 Cycle 29 Pembrolizumab 02/03/2021 Cycle 30 Pembrolizumab 02/24/2021 MRI left hip 03/02/2021-compared to 04/29/2019, slight increase in size of metastases at the left iliac and left acetabulum.  Lesion at the left upper sacrum is less conspicuous and a lesion at the left superior pubic ramus is stable Cycle 31 pembrolizumab 03/17/2021 Radiation to left acetabulum and left iliac 03/30/2021-04/13/2021 Cycle 32 pembrolizumab 04/07/2021 Cycle 33 Pembrolizumab 04/28/2021 Cycle 34 pembrolizumab 05/18/2021 PET scan 06/01/2021-no significant change in size or degree of FDG uptake associated with FDG avid left lower lobe lung nodule compatible with neoplasm.  Stable to improved appearance of multifocal FDG avid bone metastasis. Cycle 35 pembrolizumab 06/10/2021 Cycle 36 pembrolizumab 07/01/2021 Cycle 37 pembrolizumab 07/29/2021 Cycle 38 pembrolizumab 08/19/2021 Cycle 39 Pembrolizumab 09/09/2021 Cycle 40 pembrolizumab 09/30/2021 CT chest 10/20/2021 to evaluate complaints of chest pain-no PE.  Interval increase in left lower lobe mass. Cycle 41 Pembrolizumab 10/21/2021 Cycle 42 pembrolizumab 11/11/2021 PET 11/24/2021-mild increase in size of left lower lobe mass, no evidence of solid organ or nodal metastases, stable multifocal bone metastases.  No new sites of metastatic disease. Cycle 43 pembrolizumab 11/29/2021 Cycle 45 Pembrolizumab 12/20/2021 Cycle 46 Pembrolizumab 01/09/2022 Cycle 47 pembrolizumab 02/01/2022 Cycle 48 Pembrolizumab 02/24/2022 Cycle 49 Pembrolizumab 03/17/2022 MRI l pelvis  04/12/2022-"new "lesion at the left superior acetabulum, stable bone lesions at the left suprapubic ramus, left S1,  and left iliac Palliative radiation to the left hip and left scapula 04/26/2022-05/09/2022 Pain secondary to metastatic lung cancer Chronic back pain Type 2 diabetes Essential hypertension CAD Hyperlipidemia Family history significant for multiple members with breast cancer Grade 1 skin rash 07/18/2019 likely related to immunotherapy.  Topical steroid cream as needed. E. coli urinary tract infection 07/14/2019.  Completed cephalexin. Edema/tenderness at the right greater than left ankle 10/21/2019-etiology unclear, potentially related to systemic therapy or an infection, doxycycline prescribed-improved 10/23/2019; marked improvement 11/20/2019; at office visit 01/02/2020 she reports worsening of lower extremity edema, pain/tenderness, erythema 3 to 4 days following each treatment.  Alimta held 01/02/2020.  Referral to dermatology. COVID-19 infection 03/30/2020, monoclonal antibody therapy 04/06/2020 Arthritis, possibly related to Pembrolizumab, improved since beginning Medrol, now followed by Dr. Amil Amen Pain left femoral head/upper femur/groin-negative plain x-ray; CT pelvis 03/28/2022-no acute appearing bone abnormality at the left hip or involving the proximal left femur, no evidence of acute fracture or dislocation, soft tissues left hip and left groin unremarkable; osseous mets within the left sacrum, left iliac bone and anterior acetabulum without evidence of associated fracture or dislocation.       Disposition: Ms. Ozdemir has metastatic non-small cell lung cancer.  She continues every 3-week pembrolizumab.  She is tolerating the pembrolizumab well.  I doubt the skin lesions are related to pembrolizumab.  She will complete another cycle today.  She completed palliative radiation to the left scapula and left acetabulum last week.  She continues to have severe pain at the left  acetabulum.  Hopefully the pain will improve over the next few weeks.  She would like to proceed with a restaging PET scan.  This will be scheduled prior to an office visit in 3 weeks.  She will try an increased dose of hydrocodone for pain.  Tina Coder, MD  05/18/2022  10:36 AM

## 2022-05-18 NOTE — Patient Instructions (Signed)
[facility] [place]   Discharge Instructions: Thank you for choosing [facility] to provide your oncology and hematology care.   If you have a lab appointment with the [facility], please go directly to the [facility] and check in at the registration area.   Wear comfortable clothing and clothing appropriate for easy access to any Portacath or PICC line.   We strive to give you quality time with your provider. You may need to reschedule your appointment if you arrive late (15 or more minutes).  Arriving late affects you and other patients whose appointments are after yours.  Also, if you miss three or more appointments without notifying the office, you may be dismissed from the clinic at the provider's discretion.      For prescription refill requests, have your pharmacy contact our office and allow 72 hours for refills to be completed.    Today you received the following chemotherapy and/or immunotherapy agents Pembrolizumab (KEYTRUDA).      To help prevent nausea and vomiting after your treatment, we encourage you to take your nausea medication as directed.  BELOW ARE SYMPTOMS THAT SHOULD BE REPORTED IMMEDIATELY: *FEVER GREATER THAN 100.4 F (38 C) OR HIGHER *CHILLS OR SWEATING *NAUSEA AND VOMITING THAT IS NOT CONTROLLED WITH YOUR NAUSEA MEDICATION *UNUSUAL SHORTNESS OF BREATH *UNUSUAL BRUISING OR BLEEDING *URINARY PROBLEMS (pain or burning when urinating, or frequent urination) *BOWEL PROBLEMS (unusual diarrhea, constipation, pain near the anus) TENDERNESS IN MOUTH AND THROAT WITH OR WITHOUT PRESENCE OF ULCERS (sore throat, sores in mouth, or a toothache) UNUSUAL RASH, SWELLING OR PAIN  UNUSUAL VAGINAL DISCHARGE OR ITCHING   Items with * indicate a potential emergency and should be followed up as soon as possible or go to the Emergency Department if any problems should occur.  Please show the CHEMOTHERAPY ALERT CARD or IMMUNOTHERAPY ALERT CARD  at check-in to the Emergency Department and triage nurse.  Should you have questions after your visit or need to cancel or reschedule your appointment, please contact [facility]  Dept: [phone]  and follow the prompts.  Office hours are 8:00 a.m. to 4:30 p.m. Monday - Friday. Please note that voicemails left after 4:00 p.m. may not be returned until the following business day.  We are closed weekends and major holidays. You have access to a nurse at all times for urgent questions. Please call the main number to the clinic Dept: [phone] and follow the prompts.   For any non-urgent questions, you may also contact your provider using MyChart. We now offer e-Visits for anyone 83 and older to request care online for non-urgent symptoms. For details visit [email or web address].   Also download the MyChart app! Go to the app store, search "MyChart", open the app, select [facility], and log in with your MyChart username and password.  Masks are optional in the cancer centers. If you would like for your care team to wear a mask while they are taking care of you, please let them know. You may have one support person who is at least 85 years old accompany you for your appointments.  Pembrolizumab Injection What is this medication? PEMBROLIZUMAB (PEM broe LIZ ue mab) treats some types of cancer. It works by helping your immune system slow or stop the spread of cancer cells. It is a monoclonal antibody. This medicine may be used for other purposes; ask your health care provider or pharmacist if you have questions. COMMON BRAND NAME(S):  Keytruda What should I tell my care team before I take this medication? They need to know if you have any of these conditions: Allogeneic stem cell transplant (uses someone else's stem cells) Autoimmune diseases, such as Crohn disease, ulcerative colitis, lupus History of chest radiation Nervous system problems, such as Guillain-Barre  syndrome, myasthenia gravis Organ transplant An unusual or allergic reaction to pembrolizumab, other medications, foods, dyes, or preservatives Pregnant or trying to get pregnant Breast-feeding How should I use this medication? This medication is injected into a vein. It is given by your care team in a hospital or clinic setting. A special MedGuide will be given to you before each treatment. Be sure to read this information carefully each time. Talk to your care team about the use of this medication in children. While it may be prescribed for children as young as 6 months for selected conditions, precautions do apply. Overdosage: If you think you have taken too much of this medicine contact a poison control center or emergency room at once. NOTE: This medicine is only for you. Do not share this medicine with others. What if I miss a dose? Keep appointments for follow-up doses. It is important not to miss your dose. Call your care team if you are unable to keep an appointment. What may interact with this medication? Interactions have not been studied. This list may not describe all possible interactions. Give your health care provider a list of all the medicines, herbs, non-prescription drugs, or dietary supplements you use. Also tell them if you smoke, drink alcohol, or use illegal drugs. Some items may interact with your medicine. What should I watch for while using this medication? Your condition will be monitored carefully while you are receiving this medication. You may need blood work while taking this medication. This medication may cause serious skin reactions. They can happen weeks to months after starting the medication. Contact your care team right away if you notice fevers or flu-like symptoms with a rash. The rash may be red or purple and then turn into blisters or peeling of the skin. You may also notice a red rash with swelling of the face, lips, or lymph nodes in your neck or under  your arms. Tell your care team right away if you have any change in your eyesight. Talk to your care team if you may be pregnant. Serious birth defects can occur if you take this medication during pregnancy and for 4 months after the last dose. You will need a negative pregnancy test before starting this medication. Contraception is recommended while taking this medication and for 4 months after the last dose. Your care team can help you find the option that works for you. Do not breastfeed while taking this medication and for 4 months after the last dose. What side effects may I notice from receiving this medication? Side effects that you should report to your care team as soon as possible: Allergic reactions--skin rash, itching, hives, swelling of the face, lips, tongue, or throat Dry cough, shortness of breath or trouble breathing Eye pain, redness, irritation, or discharge with blurry or decreased vision Heart muscle inflammation--unusual weakness or fatigue, shortness of breath, chest pain, fast or irregular heartbeat, dizziness, swelling of the ankles, feet, or hands Hormone gland problems--headache, sensitivity to light, unusual weakness or fatigue, dizziness, fast or irregular heartbeat, increased sensitivity to cold or heat, excessive sweating, constipation, hair loss, increased thirst or amount of urine, tremors or shaking, irritability Infusion reactions--chest pain, shortness  of breath or trouble breathing, feeling faint or lightheaded Kidney injury (glomerulonephritis)--decrease in the amount of urine, red or dark brown urine, foamy or bubbly urine, swelling of the ankles, hands, or feet Liver injury--right upper belly pain, loss of appetite, nausea, light-colored stool, dark yellow or brown urine, yellowing skin or eyes, unusual weakness or fatigue Pain, tingling, or numbness in the hands or feet, muscle weakness, change in vision, confusion or trouble speaking, loss of balance or  coordination, trouble walking, seizures Rash, fever, and swollen lymph nodes Redness, blistering, peeling, or loosening of the skin, including inside the mouth Sudden or severe stomach pain, bloody diarrhea, fever, nausea, vomiting Side effects that usually do not require medical attention (report to your care team if they continue or are bothersome): Bone, joint, or muscle pain Diarrhea Fatigue Loss of appetite Nausea Skin rash This list may not describe all possible side effects. Call your doctor for medical advice about side effects. You may report side effects to FDA at 1-800-FDA-1088. Where should I keep my medication? This medication is given in a hospital or clinic. It will not be stored at home. NOTE: This sheet is a summary. It may not cover all possible information. If you have questions about this medicine, talk to your doctor, pharmacist, or health care provider.  2023 Elsevier/Gold Standard (2021-11-07 00:00:00)

## 2022-05-18 NOTE — Progress Notes (Signed)
Patient seen by Dr. Sherrill today ? ?Vitals are within treatment parameters. ? ?Labs reviewed by Dr. Sherrill and are within treatment parameters. ? ?Per physician team, patient is ready for treatment and there are NO modifications to the treatment plan.  ?

## 2022-05-19 ENCOUNTER — Encounter: Payer: Self-pay | Admitting: Oncology

## 2022-05-19 ENCOUNTER — Other Ambulatory Visit: Payer: Self-pay

## 2022-05-23 ENCOUNTER — Other Ambulatory Visit (HOSPITAL_COMMUNITY): Payer: Self-pay

## 2022-05-25 ENCOUNTER — Encounter: Payer: Self-pay | Admitting: Oncology

## 2022-05-25 NOTE — Progress Notes (Signed)
                                                                                                                                                             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   Intent: Palliative  Radiation Treatment Dates: 04/26/2022 through 05/09/2022 Site Technique Total Dose (Gy) Dose per Fx (Gy) Completed Fx Beam Energies  Hip, Left: Pelvis_L 3D 30/30 3 10/10 10X, 15X  Scapula, Left: Chest_L Complex 30/30 3 10/10 10X   Narrative: The patient tolerated radiation therapy relatively well. She did continue to have pain in her left hip but did have improvement in her left shoulder region.   Plan: The patient will receive a call in about one month from the radiation oncology department. She will continue follow up with Dr. Benay Spice as well.   ________________________________________________    Carola Rhine, [facility]

## 2022-05-26 ENCOUNTER — Telehealth: Payer: Self-pay | Admitting: *Deleted

## 2022-05-26 NOTE — Telephone Encounter (Signed)
Spoke with the patient to let her know that Dr. Lisbeth Renshaw agrees with her pain medication increase.  He advised her to take the medication on a schedule as prescribed.  She can continue to use heat/ice as needed.  He will be looking for the results from her upcoming PET scan 06/02/2022.  She verbalized understanding of his recommendations.  She was informed to give [place] a call should her pain become unbearable or unrelieved with her medication.  [name]. Leonie Green, BSN

## 2022-06-02 ENCOUNTER — Encounter (HOSPITAL_COMMUNITY)
Admission: RE | Admit: 2022-06-02 | Discharge: 2022-06-02 | Disposition: A | Discharge status: Home / Self Care | Payer: Medicare Other | Source: Ambulatory Visit | Attending: Oncology | Admitting: Oncology

## 2022-06-02 DIAGNOSIS — C3492 Malignant neoplasm of unspecified part of left bronchus or lung: Secondary | ICD-10-CM | POA: Insufficient documentation

## 2022-06-02 LAB — GLUCOSE, CAPILLARY: Glucose-Capillary: 116 mg/dL — ABNORMAL HIGH (ref 70–99)

## 2022-06-02 MED ORDER — FLUDEOXYGLUCOSE F - 18 (FDG) INJECTION
9.3000 | Freq: Once | INTRAVENOUS | Status: AC | PRN
  Administered 2022-06-02: 8.9 via INTRAVENOUS

## 2022-06-06 ENCOUNTER — Other Ambulatory Visit: Payer: Self-pay

## 2022-06-06 ENCOUNTER — Encounter: Payer: Self-pay | Admitting: Cardiovascular Disease

## 2022-06-06 ENCOUNTER — Ambulatory Visit: Payer: Medicare Other | Attending: Cardiovascular Disease | Admitting: Cardiovascular Disease

## 2022-06-06 VITALS — BP 151/75 | HR 52 | Ht 65.0 in | Wt 188.6 lb

## 2022-06-06 DIAGNOSIS — I10 Essential (primary) hypertension: Secondary | ICD-10-CM

## 2022-06-06 DIAGNOSIS — E118 Type 2 diabetes mellitus with unspecified complications: Secondary | ICD-10-CM | POA: Diagnosis not present

## 2022-06-06 DIAGNOSIS — C3492 Malignant neoplasm of unspecified part of left bronchus or lung: Secondary | ICD-10-CM | POA: Diagnosis not present

## 2022-06-06 DIAGNOSIS — E785 Hyperlipidemia, unspecified: Secondary | ICD-10-CM

## 2022-06-06 DIAGNOSIS — G2581 Restless legs syndrome: Secondary | ICD-10-CM

## 2022-06-06 DIAGNOSIS — I25118 Atherosclerotic heart disease of native coronary artery with other forms of angina pectoris: Secondary | ICD-10-CM

## 2022-06-06 MED ORDER — METOPROLOL TARTRATE 25 MG PO TABS: 12.5000 mg | ORAL_TABLET | Freq: Two times a day (BID) | ORAL | 3 refills | Status: DC

## 2022-06-06 NOTE — Patient Instructions (Signed)
Medication Instructions:  Decrease Metoprolol to 12.5 mg twice daily   *If you need a refill on your cardiac medications before your next appointment, please call your pharmacy*   Follow-Up: At [facility], you and your health needs are our priority.  As part of our continuing mission to provide you with exceptional heart care, we have created designated Provider Care Teams.  These Care Teams include your primary Cardiologist (physician) and Advanced Practice Providers (APPs -  Physician Assistants and Nurse Practitioners) who all work together to provide you with the care you need, when you need it.  We recommend signing up for the patient portal called "MyChart".  Sign up information is provided on this After Visit Summary.  MyChart is used to connect with patients for Virtual Visits (Telemedicine).  Patients are able to view lab/test results, encounter notes, upcoming appointments, etc.  Non-urgent messages can be sent to your provider as well.   To learn more about what you can do with MyChart, go to [email or web address].    Your next appointment:   6 month(s)  The format for your next appointment:   In Person  Provider:   Shelva Majestic, MD

## 2022-06-06 NOTE — Progress Notes (Signed)
Patient ID: [name], female   DOB: July 28, 1935, 87 y.o.   MRN: [id number]        HPI: [name] is a 87 y.o. female who presents to the office for a 6 month cardiology evaluation in follow-up of [name] cardiac catheterization.   [name] has known CAD and in March 2012 underwent stenting of a 90% eccentric RCA stenosis with a 3.0x15 mm integrity bare-metal stent. She developed recurrent chest pain in July 2012 and repeat catheterization revealed widely patent stent. She had a 60% postural diagonal 1 stenosis and medical therapy was recommended. She had mild luminal irregularities of the LAD.  In 2012 an echo Doppler study showed normal systolic function with mild aortic sclerosis mild MR and mild TR. She has a history of hyperlipidemia, restless legs, as well as documented insulin resistance.  She developed a herniated disc in [name] neck and underwent surgery by Dr. Sherley Bounds on November 30 1 2014. She tolerated surgery well from a cardiovascular standpoint.  She has mild nerve damage from an episode of shingles which is improved with Neurontin. She does have restless legs which she takes Sinemet 25/100, one quarter of a tablet 2 times per day. She does have hyperlipidemia. She does have hypertension. She denies presyncope or syncope.    When I saw [name] in 2016 she had noticed development of rare chest pain when she walks fast or walks up a steep incline.  She underwent a nuclear perfusion study in October 2016 which showed normal perfusion and function without scar or ischemia.    She underwent eye surgery in  August 2017 involving [name] lacrimal duct.  She did have significant nosebleed following this.  She underwent a follow-up echo Doppler study in December 2017 which showed normal ejection fraction at 55-60% with grade 1 diastolic dysfunction.  There was aortic valve sclerosis without stenosis.  She tells me that [name] metformin was discontinued this past year by Dr.  [name].  Laboratory had shown a hemoglobin A1c of 6.1.  In May 2018 LDL cholesterol was 63 on rosuvastatin.  I last saw [name] in February 2019.  At that time [name] blood pressure was stable she has continued to be on lisinopril 5 mg daily, metoprolol 37.5 mg twice a day for hypertension.  She has peripheral neuropathy on gabapentin 300 mg daily.  She has restless legs, and also takes carbidopa levodopa 25/100 mg one quarter of a tablet twice a day with improvement in symptomatology.  She denied any recurrent anginal symptoms.  There is mild shortness of breath with activity.  She is active.  I encouraged at least 150 minutes/week of exercise.  I saw [name] in February 2020.  At that time felt fairly well from a cardiac standpoint.  She continues to experience some mild shortness of breath with activity without significant change.  She was walking [name] dog at least 20 minutes a day basis.  She has been documented to have diastolic dysfunction on echocardiography.  She was under  increased stress with [name] husband's illness who in addition to his bronchiectasis also has developed leukemia.  I saw [name] in February 2021.  Since [name] prior evaluation with me she unfortunately  was diagnosed with non-small cell lung CA, stage IV with metastatic bone lesion.  She is followed by Dr. Benay Spice.  She completed 4 cycles of carboplatin/Alimta/pembrolizumab on August 07, 2019.  She had a remote history of smoking and had smoked 1 pack/day for 25 years and quit  smoking in 1990, 31 years ago.  She denied any chest pain, PND orthopnea.  During that evaluation, on exam she had an early peaking systolic murmur felt consistent with probable mild aortic stenosis.  She had not had an echo Doppler study since 2017 and I commended a follow-up echo Doppler assessment.  She underwent a follow-up echo Doppler study in September 26, 2019.  This showed an EF of 60 to 65%.  There was moderate asymmetric left ventricular hypertrophy of the basal  septum without S.A.M. or LVOT obstruction.  There was grade 1 diastolic dysfunction.  Estimated RV systolic pressure was 17.4 mm.  Aortic valve was tricuspid and there was no evidence for stenosis.  I saw [name] in November 2021.  She continues to be followed by oncology for [name] metastatic lung cancer and  completed 18 cycles of pembrolizumab.  She continues to have back and hip pain.  She also developed a COVID-19 infection on March 30, 2020 and received monoclonal antibody therapy on April 06, 2020.  She denies any chest pain, PND orthopnea.  She denies any presyncope or syncope.    I saw [name] on Dec 21, 2020.  At that time a recent CT had shown some mets to [name] bone.  She she continues to follow with Dr. Benay Spice and had another cycle of Keytruda on November 26, 2020.  At times she admits to rare angina and has taken sublingual nitroglycerin with benefit rarely.  She denies any prolonged episodes of chest pain.  She continues to be on lisinopril 10 mg, metoprolol tartrate 25 mg twice a day and was taking baby aspirin 3 times per week.  She is on rosuvastatin 40 mg daily for hyperlipidemia.    When I saw [name] saw [name] on June 27, 2021  she was having progressive difficulty with low back pain.  She also has undergone additional treatment with Keytruda followed by Dr. Benay Spice for [name] metastatic non-small cell lung CA, stage IV.  She tells me recent PET scan did show improvement in [name] lesions which are now smaller.  Due to progressive lower back discomfort, she was tentatively scheduled to undergo laminectomy and foraminectomy at L4-5 and L5-S1 by Dr. Sherley Bounds scheduled for July 18, 2021.  She denied any chest pain.  She had run out of [name] sublingual nitroglycerin and was in need for a renewal.  She was on amlodipine 2.5 mg, isosorbide 30 mg daily, lisinopril 10 mg, metoprolol tartrate 25 mg at bedtime, in addition to rosuvastatin 40 mg for significant hyperlipidemia.  She is no longer on aspirin  therapy.  She has noted some very mild lower extremity edema to [name] pretibial are. She has not had any anginal symptomatology.  She is diabetic on metformin.  She presented for follow-up evaluation and was given preoperative clearance for [name] upcoming surgery.  I saw [name] on November 01, 2021.  She continues to be followed for [name] non-small cell lung CA by Dr. Ammie Dalton and has received 41 cycles of pembrolizumab.  Recently, she has noticed more shortness of breath with activity and walking.  She has developed several episodes of chest tightness which occurred at night which have been nitrate responsive.  She states this discomfort commences in [name] chest and goes up to [name] neck and is very similar to [name] prior anginal symptomatology.  Due to [name] similar characteristics to [name] prior angina she feels she needs another cardiac catheterization.  That evaluation, I recommended that she undergo an echo Doppler study  and scheduled [name] for definitive repeat cardiac catheterization.  I recommended that she reinitiate aspirin therapy 81 mg.  I further titrated [name] metoprolol to tartrate from 25 mg twice a day to 37.5 mg in the morning and continue to take 25 mg at night and increase isosorbide to 60 mg daily.  She was continue to take amlodipine 2.5 mg and she has a prescription for lisinopril 10 mg daily and takes HCTZ 12.5 mg on a as needed basis.  An echo Doppler study on November 09, 2021 showed normal LV function with EF 60 to 65% without wall motion abnormalities.  There was grade 1 diastolic dysfunction, normal right heart pressures, and mild aortic valve sclerosis without stenosis.  Definitive cardiac catheterization was performed on November 15, 2021 and she was not found to have significant obstructive disease.  [name] mid RCA stent was widely patent and there was mild calcification and irregularity of the LAD with 10% narrowing, 25% mild diagonal stenosis and 10% narrowing in [name] RCA proximal to a previously placed stent.  I  last saw [name] on Dec 08, 2021 and since [name] cardiac catheterization she felt well from a cardiovascular standpoint . However she has been taking [name] blood pressure and this typically has been low.  When she awakens [name] blood pressure typically is in the 90s over 50s.  It seems to increase to 110-120 during the day.  She has not had any recurrent chest pain. She denied presyncope or syncope.  During that evaluation, with [name] low blood pressure I reduced [name] isosorbide down to 30 mg and.  [name] reduced blood pressure in the morning and heart rate in the 50s I reduced [name] metoprolol tartrate down to 25 mg in the morning and 12.5 mg in the evening.  If [name] blood pressure continues to be low I discussed possible further reduction of lisinopril to 5 mg.  Since I last saw [name], she denies any chest pain or shortness of breath.  At times she still notes [name] blood pressure in the mornings in the 10X systolically.  She continues to be on amlodipine 2.5 mg, isosorbide 30 mg, lisinopril 10 mg, and metoprolol tartrate 25 mg in the morning and 12.5 mg at night.  She has a prescription for HCTZ which she takes as needed for swelling.  She is on rosuvastatin 40 mg for hyperlipidemia.  She is on gabapentin and Requip for restless legs.  She has developed left hip pain from [name] metastatic disease and has continued treatment with pembrolizumab and is now using a cane.  She continues to be followed by Dr. Ammie Dalton and will be undergoing a PET scan.  She presents for follow-up Cardiologic evaluation.   Past Medical History:  Diagnosis Date   Aneurysm ([place])    Right eye a non DES stent was placed so that she would not required long term dual antiplatelet therapy.   Anginal pain (HCC)    Anxiety    not currently taking any meds   Arthritis    CAD (coronary artery disease)    Diabetes mellitus without complication ([place])    H/O hiatal hernia    Heart murmur    History of stress test 03/31/2012   The post stress myocardial  perfusion images show a normal pattern of perfusion in all region. The post left ventricles is normal in size. There is no scintigraphic of inductible myocardial ischemia. The post EF is 73   Hx of echocardiogram 11/29/2010   Ef 67% Normal size  chambes, Aortic valve sclerosis without stenosis, No other significant valvular abnormalities, No percardial effusion.   Hyperlipemia    Hypertension    Insulin resistance    Lung cancer (HCC)    Multiple thyroid nodules    Neuromuscular disorder (HCC)    nerve pain after shingles   Osteoporosis    Restless legs    Shingles     Past Surgical History:  Procedure Laterality Date   ABDOMINAL HYSTERECTOMY  1970   ANGIOPLASTY     Stenting of a 90% eccentric right coronary artery stenosis and had a 3.0x15 mm Integrity bare-metal stent inserted   ANTERIOR CERVICAL DECOMP/DISCECTOMY FUSION N/A 07/30/2013   Procedure: ANTERIOR CERVICAL DECOMPRESSION/DISCECTOMY FUSION CERVICAL FIVE -SIX;  Surgeon: Eustace Moore, MD;  Location: [facility];  Service: Neurosurgery;  Laterality: N/A;   APPENDECTOMY     BREAST SURGERY Left    cysts removed from left breast (in [name] 20'2)   CARDIAC CATHETERIZATION     Showed a widely patent stent, she did have 60% ostial diagonal-1 stenosis, She also had mild luminal irregularities of [name] LAD.   COLONOSCOPY     EYE SURGERY Bilateral 2009   cateract surgery- bilateral   IR IMAGING GUIDED PORT INSERTION  06/04/2019   LAMINECTOMY WITH POSTERIOR LATERAL ARTHRODESIS LEVEL 2 Right 07/18/2021   Procedure: Laminectomy and Foraminotomy - Lumbar four-Lumbar five - Lumbar five-Sacral one- right, posterolateral fusion Lumbar four-five with interspinous plate;  Surgeon: Eustace Moore, MD;  Location: [facility];  Service: Neurosurgery;  Laterality: Right;   LEFT HEART CATH AND CORONARY ANGIOGRAPHY N/A 11/15/2021   Procedure: LEFT HEART CATH AND CORONARY ANGIOGRAPHY;  Surgeon: Troy Sine, MD;  Location: [facility];  Service:  Cardiovascular;  Laterality: N/A;   REFRACTIVE SURGERY Left 2011   TONSILLECTOMY      Allergies  Allergen Reactions   Codeine Nausea And Vomiting and Other (See Comments)    Severe constipation.    Current Outpatient Medications  Medication Sig Dispense Refill   ALPRAZolam (XANAX) 0.5 MG tablet Take 0.5 mg by mouth 2 (two) times daily as needed for anxiety.     amLODipine (NORVASC) 2.5 MG tablet Take 2.5 mg by mouth daily.     aspirin EC 81 MG tablet Take 1 tablet (81 mg total) by mouth daily. Swallow whole. 90 tablet 3   carboxymethylcellulose (REFRESH PLUS) 0.5 % SOLN Place 1 drop into both eyes 3 (three) times daily as needed (dry eyes).     gabapentin (NEURONTIN) 300 MG capsule Take 300 mg by mouth at bedtime.      hydrochlorothiazide (MICROZIDE) 12.5 MG capsule Take 12.5 mg by mouth daily as needed (fluid).     HYDROcodone-acetaminophen (NORCO) 10-325 MG tablet Take 1 tablet by mouth every 4 (four) hours as needed. 60 tablet 0   isosorbide mononitrate (IMDUR) 30 MG 24 hr tablet TAKE 1 TABLET BY MOUTH DAILY 90 tablet 3   lisinopril (ZESTRIL) 10 MG tablet TAKE 1 TABLET BY MOUTH DAILY 90 tablet 3   Magnesium 400 MG CAPS Take 200 mg by mouth as needed. Takes with pain med     metFORMIN (GLUCOPHAGE) 500 MG tablet Take 500 mg by mouth at bedtime.     Multiple Vitamins-Minerals (CENTRUM SILVER PO) Take 1 tablet by mouth daily.     nitroGLYCERIN (NITROSTAT) 0.4 MG SL tablet Place 1 tablet (0.4 mg total) under the tongue every 5 (five) minutes as needed for chest pain. (Patient not taking: Reported on  06/08/2022) 25 tablet 11   polyethylene glycol (MIRALAX / GLYCOLAX) packet Take 17 g by mouth daily.     rOPINIRole (REQUIP) 1 MG tablet Take 1 mg by mouth 3 (three) times daily.     rosuvastatin (CRESTOR) 40 MG tablet Take 40 mg by mouth at bedtime.      traZODone (DESYREL) 100 MG tablet Take 100 mg by mouth at bedtime.      Vitamin D, Ergocalciferol, (DRISDOL) 50000 UNITS CAPS capsule Take  50,000 Units by mouth every Sunday.     zinc gluconate 50 MG tablet Take 50 mg by mouth at bedtime.     metoprolol tartrate (LOPRESSOR) 25 MG tablet Take 0.5 tablets (12.5 mg total) by mouth 2 (two) times daily. 45 tablet 3   No current facility-administered medications for this visit.    Social History   Socioeconomic History   Marital status: Married    Spouse name: Not on file   Number of children: Not on file   Years of education: Not on file   Highest education level: Not on file  Occupational History   Not on file  Tobacco Use   Smoking status: Former    Packs/day: 2.00    Years: 33.00    Total pack years: 66.00    Types: Cigarettes   Smokeless tobacco: Never   Tobacco comments:    quit in 1995.  Vaping Use   Vaping Use: Never used  Substance and Sexual Activity   Alcohol use: Yes    Alcohol/week: 0.0 standard drinks of alcohol    Comment: occasional (maybe twice a year)   Drug use: No   Sexual activity: Not on file  Other Topics Concern   Not on file  Social History Narrative   Not on file   Social Determinants of Health   Financial Resource Strain: Not on file  Food Insecurity: No Food Insecurity (03/18/2021)   Hunger Vital Sign    Worried About Running Out of Food in the Last Year: Never true    Ran Out of Food in the Last Year: Never true  Transportation Needs: No Transportation Needs (03/18/2021)   PRAPARE - Hydrologist (Medical): No    Lack of Transportation (Non-Medical): No  Physical Activity: Not on file  Stress: Not on file  Social Connections: Not on file  Intimate Partner Violence: Not on file    Family History  Problem Relation Age of Onset   Alzheimer's disease Mother 37   Colon polyps Mother    Diabetes Mother    Heart attack Father 66   Cancer Maternal Grandmother 83   Breast cancer Maternal Grandmother    Diabetes Sister    Breast cancer Maternal Uncle    Breast cancer Maternal Aunt        x 2    Colon polyps Sister    Colon polyps Maternal Aunt    Colon cancer Neg Hx    Esophageal cancer Neg Hx    Rectal cancer Neg Hx    Stomach cancer Neg Hx    Socially she is married has one child and 2 grandchildren. There is no tobacco or alcohol use.  ROS General: Negative; No fevers, chills, or night sweats;  HEENT: Negative; No changes in vision or hearing, sinus congestion, difficulty swallowing Pulmonary: Positive for non-small cell lung cancer, metastatic to bone multiple sites Cardiovascular: see HPI GI: GERD; No nausea, vomiting, diarrhea, or abdominal pain GU: Negative; No dysuria, hematuria, or  difficulty voiding Musculoskeletal: Negative; no myalgias, joint pain, or weakness Hematologic/Oncology: Non-small cell CA with ongoing treatment with Keytruda Endocrine: Negative; no heat/cold intolerance; no diabetes Neuro: Positive for restless legs Skin: Negative; No rashes or skin lesions Psychiatric: Negative; No behavioral problems, depression Sleep: Negative; No snoring, daytime sleepiness, hypersomnolence, bruxism, restless legs, hypnogognic hallucinations, no cataplexy Other comprehensive 14 point system review is negative   PE. BP (!) 151/75 (BP Location: Left Arm, Patient Position: Sitting)   Pulse (!) 52   Ht 5' 5" (1.651 m)   Wt 188 lb 9.6 oz (85.5 kg)   SpO2 99%   BMI 31.38 kg/m   Repeat blood pressure by me was 118/74  Wt Readings from Last 3 Encounters:  06/08/22 187 lb (84.8 kg)  06/06/22 188 lb 9.6 oz (85.5 kg)  05/18/22 187 lb (84.8 kg)   General: Alert, oriented, no distress.  Skin: normal turgor, no rashes, warm and dry HEENT: Normocephalic, atraumatic. Pupils equal round and reactive to light; sclera anicteric; extraocular muscles intact;  Nose without nasal septal hypertrophy Mouth/Parynx benign; Mallinpatti scale 3 Neck: No JVD, no carotid bruits; normal carotid upstroke Lungs: clear to ausculatation and percussion; no wheezing or rales Chest  wall: without tenderness to palpitation Heart: PMI not displaced, RRR, s1 s2 normal, 1/6 systolic murmur, no diastolic murmur, no rubs, gallops, thrills, or heaves Abdomen: soft, nontender; no hepatosplenomehaly, BS+; abdominal aorta nontender and not dilated by palpation. Back: no CVA tenderness Pulses 2+ Musculoskeletal: full range of motion, normal strength, no joint deformities Extremities: no clubbing cyanosis or edema, Homan's sign negative  Neurologic: grossly nonfocal; Cranial nerves grossly wnl Psychologic: Normal mood and affect    General: Alert, oriented, no distress.  Skin: normal turgor, no rashes, warm and dry HEENT: Normocephalic, atraumatic. Pupils equal round and reactive to light; sclera anicteric; extraocular muscles intact;  Nose without nasal septal hypertrophy Mouth/Parynx benign; Mallinpatti scale 3 Neck: No JVD, no carotid bruits; normal carotid upstroke Lungs: clear to ausculatation and percussion; no wheezing or rales Chest wall: without tenderness to palpitation Heart: PMI not displaced, RRR, s1 s2 normal, 1/6 systolic murmur, no diastolic murmur, no rubs, gallops, thrills, or heaves Abdomen: soft, nontender; no hepatosplenomehaly, BS+; abdominal aorta nontender and not dilated by palpation. Back: no CVA tenderness Pulses 2+ right radial site stable Musculoskeletal: full range of motion, normal strength, no joint deformities Extremities: no clubbing cyanosis or edema, Homan's sign negative  Neurologic: grossly nonfocal; Cranial nerves grossly wnl Psychologic: Normal mood and affect  June 06, 2022 ECG (independently read by me): Sinus bradycardia ay 52, 1st degree block, PR 236 msec, QS V1-2  Dec 08, 2021 ECG (independently read by me): Sinus bradycardia at 54, 1st degree AV block; PR 246 msec  November 01, 2021 ECG (independently read by me):  Sinus rhythm at 64, PVC, LVH   June 27, 2021 ECG (independently read by me): Sinus bradycardia at 53; Ist  degree AV block; PR 236 msec  November 2021 ECG (independently read by me): Sinus bradycardia at 59; no ectopy; PR 200 msec, QTc 386 msec  September 12, 2019 ECG (independently read by me): Sinus bradycardia at 59; MIld LVH; normal intervals  January 2020 ECG (independently read by me): Sinus bradycardia 53 bpm with first-degree AV block..  Well to 10 ms.  LVH by voltage  February 2019 ECG (independently read by me): Sinus bradycardia at 59 bpm.  First degree AV block with a PR interval of 2:30 milliseconds.  No syncope ST-T changes.  November 2017 ECG (independently read by me): Normal sinus rhythm at 62 bpm.  No ectopy.  Normal intervals.  May 2017 ECG (independently read by me): Sinus bradycardia 55 beats per minute with first-degree AV block, PR interval 238 ms.  No ST segment changes.  September 2016 ECG (independently read by me): Sinus bradycardia at 55 bpm.  Nonspecific ST changes  March 2015 ECG (independently read by me): Sinus bradycardia 55 beats per minute. Nonspecific ST changes appear.  Prior 06/03/2013 ECG: Sinus rhythm with first degree AV block. QRS complex V1 V2. Nonspecific ST changes which are present previously. Borderline LVH by voltage in aVL  LABS:    Latest Ref Rng & Units 06/08/2022   10:08 AM 05/18/2022    9:27 AM 04/28/2022   10:18 AM  BMP  Glucose 70 - 99 mg/dL 121  118  101   BUN 8 - 23 mg/dL _0 Creatinine 0.44 - 1.00 mg/dL 1.18  0.92  1.03   Sodium 135 - 145 mmol/L 135  132  137   Potassium 3.5 - 5.1 mmol/L 4.5  4.2  4.1   Chloride 98 - 111 mmol/L 100  98  100   CO2 22 - 32 mmol/L _1 Calcium 8.9 - 10.3 mg/dL 9.9  10.3  10.0       Latest Ref Rng & Units 06/08/2022   10:08 AM 05/18/2022    9:27 AM 04/28/2022   10:18 AM  Hepatic Function  Total Protein 6.5 - 8.1 g/dL 7.1  6.9  7.0   Albumin 3.5 - 5.0 g/dL 4.4  4.5  4.5   AST 15 - 41 U/L _2 ALT 0 - 44 U/L _3 Alk Phosphatase 38 - 126 U/L 46  55  54   Total  Bilirubin 0.3 - 1.2 mg/dL 0.4  0.4  0.4       Latest Ref Rng & Units 06/08/2022   10:08 AM 05/18/2022    9:27 AM 04/28/2022   10:18 AM  CBC  WBC 4.0 - 10.5 K/uL 4.8  5.4  4.2   Hemoglobin 12.0 - 15.0 g/dL 11.2  11.4  11.1   Hematocrit 36.0 - 46.0 % 34.8  35.1  33.7   Platelets 150 - 400 K/uL 147  157  166    Lab Results  Component Value Date   MCV 94.8 06/08/2022   MCV 93.1 05/18/2022   MCV 93.4 04/28/2022   Lab Results  Component Value Date   TSH 0.991 06/08/2022   Lab Results  Component Value Date   HGBA1C 7.0 (H) 07/12/2021   Lipid Panel     Component Value Date/Time   CHOL 129 04/13/2015 0802   CHOL 141 06/13/2013 0838   TRIG 87 04/13/2015 0802   TRIG 129 06/13/2013 0838   HDL 57 04/13/2015 0802   HDL 53 06/13/2013 0838   CHOLHDL 2.3 04/13/2015 0802   VLDL 17 04/13/2015 0802   LDLCALC 55 04/13/2015 0802   LDLCALC 62 06/13/2013 0838     CARDIOLOGY STUDIES:  ECHO: 11/09/2021  1. Left ventricular ejection fraction, by estimation, is 60 to 65%. Left  ventricular ejection fraction by 3D volume is 66 %. The left ventricle has  normal function. The left ventricle has no regional wall motion  abnormalities. Left ventricular diastolic   parameters are consistent with Grade I diastolic dysfunction (impaired  relaxation).  The average left ventricular global longitudinal strain is  -18.5 %. The global longitudinal strain is normal.   2. Right ventricular systolic function is normal. The right ventricular  size is normal. There is normal pulmonary artery systolic pressure. The  estimated right ventricular systolic pressure is 29.5 mmHg.   3. The mitral valve is normal in structure. No evidence of mitral valve  regurgitation. No evidence of mitral stenosis.   4. The aortic valve is tricuspid. There is mild calcification of the  aortic valve. Aortic valve regurgitation is trivial. Aortic valve  sclerosis is present, with no evidence of aortic valve stenosis.   5. The  inferior vena cava is normal in size with greater than 50%  respiratory variability, suggesting right atrial pressure of 3 mmHg.   Comparison(s): No significant change from prior study. Prior images  reviewed side by side.   CARDIAC CATH: 11/15/2021   Prox RCA lesion is 10% stenosed.   2nd Diag lesion is 25% stenosed.   Prox LAD lesion is 10% stenosed.   Previously placed Mid RCA stent (unknown type) is  widely patent.   Very mild nonobstructive CAD with evidence for coronary calcification.  The LAD has mild calcification and luminal irregularity proximally with smooth 25% diagonal stenosis; mild calcification of the left circumflex coronary artery; and widely patent mid RCA stent with smooth 10% narrowing proximal to the stented segment.   LVEDP 16 mmHg.  Previous echo Doppler study has shown an EF of 60 to 65%.   RECOMMENDATION: Medical therapy with optimal blood pressure control and lipid management.     IMPRESSION: 1. Essential hypertension   2. 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 Type 2 diabetes mellitus with complication, without long-term current use of insulin ([place])   5. Hyperlipidemia with target LDL less than 70   6. Restless leg syndrome     ASSESSMENT AND PLAN: [name] is an 87 year old female who has established CAD and underwent stenting of [name] RCA with a bare metal stent in March 2012 at which time she also was found to have a 60% ostial diagonal stenosis. She has a history of hyperlipidemia.  An echo Doppler study in December 2014 showed an ejection fraction at 55-60% without wall motion abnormalities. There was mild aortic stenosis. Mitral valve was structurally normal.  Subsequently she had undergone a follow-up nuclear perfusion study due to symptoms of vague chest pain as well as exertional dyspnea which revealed normal perfusion.  An echo  Doppler study on July 27, 2016 again showed LVEF at 55 to 60%.  There was grade 1 diastolic dysfunction.  There was minimal PA pressure elevation at 32 mm.  There was mild aortic sclerosis without restrictive mobility.  There was no AR.  [name] echo Doppler study in February 2021 showed normal systolic function with EF at 60 to 65%, moderate asymmetric LVH of the basal septal segment without  S.A.M. or LVOT obstruction.  There was grade 1 diastolic dysfunction.  She had upper normal RV systolic pressure at 30.  She was not felt to have aortic stenosis.  Unfortunately in September 2020, she was diagnosed with metastatic small cell CA and has multiple areas of bone metastases.  She is followed by Dr. Benay Spice and has been on Keytruda and will be receiving [name] 53rd cycle on June 08, 2022.  A recent PET  scan on June 02, 2022 showed stable left lower lobe tumor with slight increase in hypermetabolism associated with the left acetabular metastases and additional previously demonstrated bone lesions were no longer hypermetabolic.  She has not had any recent anginal symptomatology and [name] most recent cardiac catheterization in April 2023 showed a widely patent mid RCA stent with mild irregularity and calcification of the LAD with 25% diagonal stenosis.  She has had issues with low blood pressure particularly in the morning leading to dose reduction at [name] last visit in May 2023.  She is without anginal symptomatology and still admits that [name] blood pressure at home often times may be in the 90s.  [name] pulse today is 52 and she has first-degree AV block.  I have recommended further reduction of metoprolol to tartrate to 12.5 mg twice a day.  She continues to be on reduced dose amlodipine at 2.5 mg and has not needed HCTZ treatment.  She will continue to monitor [name] blood pressure and continue at present [name] dose of lisinopril 10 mg.  She continues to be on rosuvastatin 40 mg for hyperlipidemia.  She has not had recent  LDL cholesterol but when last checked LDL was 56.  LFTs have remained normal.  She will be following up with Dr. Ammie Dalton.  I will see [name] in 6 months for cardiology follow-up   Troy Sine, MD, [facility]  06/11/2022 3:17 PM

## 2022-06-08 ENCOUNTER — Encounter: Payer: Self-pay | Admitting: *Deleted

## 2022-06-08 ENCOUNTER — Inpatient Hospital Stay (HOSPITAL_BASED_OUTPATIENT_CLINIC_OR_DEPARTMENT_OTHER): Payer: Medicare Other | Admitting: Oncology

## 2022-06-08 ENCOUNTER — Inpatient Hospital Stay: Payer: Medicare Other

## 2022-06-08 ENCOUNTER — Inpatient Hospital Stay: Payer: Medicare Other | Attending: Oncology | Admitting: *Deleted

## 2022-06-08 VITALS — BP 116/61 | HR 56 | Temp 97.4°F | Resp 18

## 2022-06-08 DIAGNOSIS — C3492 Malignant neoplasm of unspecified part of left bronchus or lung: Secondary | ICD-10-CM | POA: Diagnosis not present

## 2022-06-08 DIAGNOSIS — C7951 Secondary malignant neoplasm of bone: Secondary | ICD-10-CM | POA: Diagnosis present

## 2022-06-08 DIAGNOSIS — C3432 Malignant neoplasm of lower lobe, left bronchus or lung: Secondary | ICD-10-CM | POA: Diagnosis present

## 2022-06-08 DIAGNOSIS — Z79899 Other long term (current) drug therapy: Secondary | ICD-10-CM | POA: Diagnosis not present

## 2022-06-08 DIAGNOSIS — Z5112 Encounter for antineoplastic immunotherapy: Secondary | ICD-10-CM | POA: Diagnosis not present

## 2022-06-08 LAB — CMP (CANCER CENTER ONLY)
ALT: 15 U/L (ref 0–44)
AST: 19 U/L (ref 15–41)
Albumin: 4.4 g/dL (ref 3.5–5.0)
Alkaline Phosphatase: 46 U/L (ref 38–126)
Anion gap: 8 (ref 5–15)
BUN: 25 mg/dL — ABNORMAL HIGH (ref 8–23)
CO2: 27 mmol/L (ref 22–32)
Calcium: 9.9 mg/dL (ref 8.9–10.3)
Chloride: 100 mmol/L (ref 98–111)
Creatinine: 1.18 mg/dL — ABNORMAL HIGH (ref 0.44–1.00)
GFR, Estimated: 45 mL/min — ABNORMAL LOW (ref >60)
Glucose, Bld: 121 mg/dL — ABNORMAL HIGH (ref 70–99)
Potassium: 4.5 mmol/L (ref 3.5–5.1)
Sodium: 135 mmol/L (ref 135–145)
Total Bilirubin: 0.4 mg/dL (ref 0.3–1.2)
Total Protein: 7.1 g/dL (ref 6.5–8.1)

## 2022-06-08 LAB — CBC WITH DIFFERENTIAL (CANCER CENTER ONLY)
Abs Immature Granulocytes: 0.01 10*3/uL (ref 0.00–0.07)
Basophils Absolute: 0 10*3/uL (ref 0.0–0.1)
Basophils Relative: 0 %
Eosinophils Absolute: 0 10*3/uL (ref 0.0–0.5)
Eosinophils Relative: 1 %
HCT: 34.8 % — ABNORMAL LOW (ref 36.0–46.0)
Hemoglobin: 11.2 g/dL — ABNORMAL LOW (ref 12.0–15.0)
Immature Granulocytes: 0 %
Lymphocytes Relative: 8 %
Lymphs Abs: 0.4 10*3/uL — ABNORMAL LOW (ref 0.7–4.0)
MCH: 30.5 pg (ref 26.0–34.0)
MCHC: 32.2 g/dL (ref 30.0–36.0)
MCV: 94.8 fL (ref 80.0–100.0)
Monocytes Absolute: 0.4 10*3/uL (ref 0.1–1.0)
Monocytes Relative: 8 %
Neutro Abs: 4 10*3/uL (ref 1.7–7.7)
Neutrophils Relative %: 83 %
Platelet Count: 147 10*3/uL — ABNORMAL LOW (ref 150–400)
RBC: 3.67 MIL/uL — ABNORMAL LOW (ref 3.87–5.11)
RDW: 13.5 % (ref 11.5–15.5)
WBC Count: 4.8 10*3/uL (ref 4.0–10.5)
nRBC: 0 % (ref 0.0–0.2)

## 2022-06-08 LAB — TSH: TSH: 0.991 u[IU]/mL (ref 0.350–4.500)

## 2022-06-08 MED ORDER — SODIUM CHLORIDE 0.9 % IV SOLN: Freq: Once | INTRAVENOUS | Status: AC

## 2022-06-08 MED ORDER — SODIUM CHLORIDE 0.9% FLUSH
10.0000 mL | INTRAVENOUS | Status: DC | PRN
  Administered 2022-06-08: 10 mL

## 2022-06-08 MED ORDER — SODIUM CHLORIDE 0.9 % IV SOLN
200.0000 mg | Freq: Once | INTRAVENOUS | Status: AC
  Administered 2022-06-08: 200 mg via INTRAVENOUS
  Filled 2022-06-08: qty 8

## 2022-06-08 MED ORDER — HEPARIN SOD (PORK) LOCK FLUSH 100 UNIT/ML IV SOLN
500.0000 [IU] | Freq: Once | INTRAVENOUS | Status: AC | PRN
  Administered 2022-06-08: 500 [IU]

## 2022-06-08 NOTE — Patient Instructions (Signed)
[facility]   Discharge Instructions: Thank you for choosing [facility] to provide your oncology and hematology care.   If you have a lab appointment with the [facility], please go directly to the [facility] and check in at the registration area.   Wear comfortable clothing and clothing appropriate for easy access to any Portacath or PICC line.   We strive to give you quality time with your provider. You may need to reschedule your appointment if you arrive late (15 or more minutes).  Arriving late affects you and other patients whose appointments are after yours.  Also, if you miss three or more appointments without notifying the office, you may be dismissed from the clinic at the provider's discretion.      For prescription refill requests, have your pharmacy contact our office and allow 72 hours for refills to be completed.    Today you received the following chemotherapy and/or immunotherapy agents Keytruda      To help prevent nausea and vomiting after your treatment, we encourage you to take your nausea medication as directed.  BELOW ARE SYMPTOMS THAT SHOULD BE REPORTED IMMEDIATELY: *FEVER GREATER THAN 100.4 F (38 C) OR HIGHER *CHILLS OR SWEATING *NAUSEA AND VOMITING THAT IS NOT CONTROLLED WITH YOUR NAUSEA MEDICATION *UNUSUAL SHORTNESS OF BREATH *UNUSUAL BRUISING OR BLEEDING *URINARY PROBLEMS (pain or burning when urinating, or frequent urination) *BOWEL PROBLEMS (unusual diarrhea, constipation, pain near the anus) TENDERNESS IN MOUTH AND THROAT WITH OR WITHOUT PRESENCE OF ULCERS (sore throat, sores in mouth, or a toothache) UNUSUAL RASH, SWELLING OR PAIN  UNUSUAL VAGINAL DISCHARGE OR ITCHING   Items with * indicate a potential emergency and should be followed up as soon as possible or go to the Emergency Department if any problems should occur.  Please show the CHEMOTHERAPY ALERT CARD or IMMUNOTHERAPY ALERT CARD at check-in to the  Emergency Department and triage nurse.  Should you have questions after your visit or need to cancel or reschedule your appointment, please contact [facility]  Dept: [phone]  and follow the prompts.  Office hours are 8:00 a.m. to 4:30 p.m. Monday - Friday. Please note that voicemails left after 4:00 p.m. may not be returned until the following business day.  We are closed weekends and major holidays. You have access to a nurse at all times for urgent questions. Please call the main number to the clinic Dept: [phone] and follow the prompts.   For any non-urgent questions, you may also contact your provider using MyChart. We now offer e-Visits for anyone 53 and older to request care online for non-urgent symptoms. For details visit [email or web address].   Also download the MyChart app! Go to the app store, search "MyChart", open the app, select [facility], and log in with your MyChart username and password.  Masks are optional in the cancer centers. If you would like for your care team to wear a mask while they are taking care of you, please let them know. You may have one support person who is at least 85 years old accompany you for your appointments.  Pembrolizumab Injection What is this medication? PEMBROLIZUMAB (PEM broe LIZ ue mab) treats some types of cancer. It works by helping your immune system slow or stop the spread of cancer cells. It is a monoclonal antibody. This medicine may be used for other purposes; ask your health care provider or pharmacist if you have questions. COMMON BRAND NAME(S): [employer]  What should I tell my care team before I take this medication? They need to know if you have any of these conditions: Allogeneic stem cell transplant (uses someone else's stem cells) Autoimmune diseases, such as Crohn disease, ulcerative colitis, lupus History of chest radiation Nervous system problems, such as Guillain-Barre syndrome, myasthenia  gravis Organ transplant An unusual or allergic reaction to pembrolizumab, other medications, foods, dyes, or preservatives Pregnant or trying to get pregnant Breast-feeding How should I use this medication? This medication is injected into a vein. It is given by your care team in a hospital or clinic setting. A special MedGuide will be given to you before each treatment. Be sure to read this information carefully each time. Talk to your care team about the use of this medication in children. While it may be prescribed for children as young as 6 months for selected conditions, precautions do apply. Overdosage: If you think you have taken too much of this medicine contact a poison control center or emergency room at once. NOTE: This medicine is only for you. Do not share this medicine with others. What if I miss a dose? Keep appointments for follow-up doses. It is important not to miss your dose. Call your care team if you are unable to keep an appointment. What may interact with this medication? Interactions have not been studied. This list may not describe all possible interactions. Give your health care provider a list of all the medicines, herbs, non-prescription drugs, or dietary supplements you use. Also tell them if you smoke, drink alcohol, or use illegal drugs. Some items may interact with your medicine. What should I watch for while using this medication? Your condition will be monitored carefully while you are receiving this medication. You may need blood work while taking this medication. This medication may cause serious skin reactions. They can happen weeks to months after starting the medication. Contact your care team right away if you notice fevers or flu-like symptoms with a rash. The rash may be red or purple and then turn into blisters or peeling of the skin. You may also notice a red rash with swelling of the face, lips, or lymph nodes in your neck or under your arms. Tell your  care team right away if you have any change in your eyesight. Talk to your care team if you may be pregnant. Serious birth defects can occur if you take this medication during pregnancy and for 4 months after the last dose. You will need a negative pregnancy test before starting this medication. Contraception is recommended while taking this medication and for 4 months after the last dose. Your care team can help you find the option that works for you. Do not breastfeed while taking this medication and for 4 months after the last dose. What side effects may I notice from receiving this medication? Side effects that you should report to your care team as soon as possible: Allergic reactions--skin rash, itching, hives, swelling of the face, lips, tongue, or throat Dry cough, shortness of breath or trouble breathing Eye pain, redness, irritation, or discharge with blurry or decreased vision Heart muscle inflammation--unusual weakness or fatigue, shortness of breath, chest pain, fast or irregular heartbeat, dizziness, swelling of the ankles, feet, or hands Hormone gland problems--headache, sensitivity to light, unusual weakness or fatigue, dizziness, fast or irregular heartbeat, increased sensitivity to cold or heat, excessive sweating, constipation, hair loss, increased thirst or amount of urine, tremors or shaking, irritability Infusion reactions--chest pain, shortness of  breath or trouble breathing, feeling faint or lightheaded Kidney injury (glomerulonephritis)--decrease in the amount of urine, red or dark brown urine, foamy or bubbly urine, swelling of the ankles, hands, or feet Liver injury--right upper belly pain, loss of appetite, nausea, light-colored stool, dark yellow or brown urine, yellowing skin or eyes, unusual weakness or fatigue Pain, tingling, or numbness in the hands or feet, muscle weakness, change in vision, confusion or trouble speaking, loss of balance or coordination, trouble  walking, seizures Rash, fever, and swollen lymph nodes Redness, blistering, peeling, or loosening of the skin, including inside the mouth Sudden or severe stomach pain, bloody diarrhea, fever, nausea, vomiting Side effects that usually do not require medical attention (report to your care team if they continue or are bothersome): Bone, joint, or muscle pain Diarrhea Fatigue Loss of appetite Nausea Skin rash This list may not describe all possible side effects. Call your doctor for medical advice about side effects. You may report side effects to FDA at 1-800-FDA-1088. Where should I keep my medication? This medication is given in a hospital or clinic. It will not be stored at home. NOTE: This sheet is a summary. It may not cover all possible information. If you have questions about this medicine, talk to your doctor, pharmacist, or health care provider.  2023 Elsevier/Gold Standard (2013-04-07 00:00:00)

## 2022-06-08 NOTE — Progress Notes (Signed)
Patient seen by Dr. Sherrill today ? ?Vitals are within treatment parameters. ? ?Labs reviewed by Dr. Sherrill and are within treatment parameters. ? ?Per physician team, patient is ready for treatment and there are NO modifications to the treatment plan.  ?

## 2022-06-08 NOTE — Progress Notes (Signed)
PATIENT NAVIGATOR PROGRESS NOTE  Name: ROX MCGRIFF Date: 06/08/2022 MRN: [id number]  DOB: 07-13-1935   Reason for visit:  Referral to Orthopedic oncology  Comments:  New patient referral faxed to [facility] Dr Magda Bernheim, orthopedic oncology to fax number [phone].  Informed patient of faxed referral    Time spent counseling/coordinating care: 30-45 minutes

## 2022-06-08 NOTE — Progress Notes (Signed)
[facility] OFFICE PROGRESS NOTE   Diagnosis: Non-small cell lung cancer  INTERVAL HISTORY:   [name] returns as scheduled.  She continues pembrolizumab.  She complains of pain at the left hip area.  The pain is worse with motion and weightbearing.  She is taking hydrocodone.  The pain has progressed.  This limits her activity.  Objective:  Vital signs in last 24 hours:  Blood pressure (!) 145/58, pulse 60, temperature 98.1 F (36.7 C), temperature source Oral, resp. rate 18, height _0  (1.651 m), weight 187 lb (84.8 kg), SpO2 98 %.    Resp: Lungs clear bilaterally Cardio: Rate and rhythm GI: No hepatosplenomegaly Vascular: No leg edema Musculoskeletal: Pain with motion at the left hip.  Tender at the trochanter and left buttock  Portacath/PICC-without erythema  Lab Results:  Lab Results  Component Value Date   WBC 4.8 06/08/2022   HGB 11.2 (L) 06/08/2022   HCT 34.8 (L) 06/08/2022   MCV 94.8 06/08/2022   PLT 147 (L) 06/08/2022   NEUTROABS 4.0 06/08/2022    CMP  Lab Results  Component Value Date   NA 135 06/08/2022   K 4.5 06/08/2022   CL 100 06/08/2022   CO2 27 06/08/2022   GLUCOSE 121 (H) 06/08/2022   BUN 25 (H) 06/08/2022   CREATININE 1.18 (H) 06/08/2022   CALCIUM 9.9 06/08/2022   PROT 7.1 06/08/2022   ALBUMIN 4.4 06/08/2022   AST 19 06/08/2022   ALT 15 06/08/2022   ALKPHOS 46 06/08/2022   BILITOT 0.4 06/08/2022   GFRNONAA 45 (L) 06/08/2022   GFRAA >60 04/21/2020    Medications: I have reviewed the patient's current medications.   Assessment/Plan: Non-small cell lung cancer MRI lumbar spine 04/29/2019- enlarging marrow lesions involving the L1 vertebral body, upper left sacrum and right iliac bone MRI pelvis 04/29/2019- 3.5 cm left iliac bone lesion appears slightly larger; other similar appearing lesions present within the left superior pubic ramus, left superior abdomen acetabulum and upper left sacrum Kappa free light chains  with mild elevation 05/12/2019  CTs 05/12/2019- left lower lobe pulmonary mass 3.3 x 3.2 cm; lytic process left iliac bone; spinal lesions; 1.1 cm low-density left kidney lesion; right thyroid enlargement with heterogeneous appearance with potential for multiple discrete lesions Biopsy left lower lobe lung mass 05/26/2019-poorly differentiated carcinoma; positive for cytokeratin 5/6, p63 and TTF-1, no EGFR, BRAF, ALK, ERBB2,ROS, or NTRK alteration Cycle 1 carboplatin/Alimta/pembrolizumab 06/06/2019 Cycle 2 carboplatin/Alimta/pembrolizumab 06/27/2019 Cycle 3 carboplatin/Alimta/pembrolizumab 07/18/2019 Cycle 4 carboplatin/Alimta/pembrolizumab 08/07/2019 CTs 08/27/2019-significant decrease in size of lobulated mass left lower lobe.  Unchanged appearance of subtle bone lesions. Cycle 5 Alimta/pembrolizumab 08/28/2019 Cycle 6 Alimta/pembrolizumab 09/18/2019 Cycle 7 Alimta/pembrolizumab 10/09/2019 Cycle 8 Alimta/pembrolizumab 10/30/2019 Cycle 9 Alimta/pembrolizumab 11/20/2019 CTs 12/09/2019-no evidence of disease progression, left lower lobe nodule slightly decreased in size, stable L1, left sacral, and left pubic ramus metastases Cycle 10 Alimta/pembrolizumab 12/11/2019 Cycle 11 pembrolizumab alone 01/02/2020 (Alimta held due to edema, tenderness, erythema at the lower legs) Cycle 12 pembrolizumab 01/23/2020 Cycle 13 pembrolizumab 02/12/2020 Cycle 14 pembrolizumab 03/04/2020 CTs 03/18/2020-stable left lower lobe lesion, mild sclerosis at the superior endplate of L1 that was previously hypermetabolic, stable small left upper sacral lucent lesion, previous left superior pubic ramus lesion is occult on the CT, CT head negative for malignancy Cycle 15 pembrolizumab 03/25/2020 Cycle 16 pembrolizumab 04/21/2020 Cycle 17 pembrolizumab 05/17/2020 05/19/2020 bone scan-no definite abnormalities to suggest osseous metastases.  Areas of concern on prior PET-CT involving left iliac bone and L1 vertebral  body showed no abnormalities  on the current study Cycle 18 Pembrolizumab 06/10/2020 Cycle 19 Pembrolizumab 06/30/2020 CTs 07/16/2020-stable left lower lobe nodule, stable faint superior L1 vertebral lesion, no evidence of disease progression Cycle 20 pembrolizumab 07/21/2020 Cycle 21 Pembrolizumab 08/11/2020 Cycle 22 pembrolizumab 09/01/2020 Cycle 23 Pembrolizumab 09/22/2020 Cycle 24 pembrolizumab 10/13/2020 Cycle 25 Pembrolizumab 11/03/2020 Cycle 26 pembrolizumab 11/26/2020 CTs 12/15/2020- stable left lower lobe mass, stable sclerotic lesion at L2, no evidence of disease progression Cycle 27 pembrolizumab 12/17/2020 PET scan 01/03/2021-1.8 x 1.3 cm left lower lobe nodule similar in size to CT of 12/15/2020 and measures smaller than previous PET/CT from 2020.  Nodule is markedly hypermetabolic.  No evidence for hypermetabolic hilar or mediastinal lymphadenopathy.  Several tiny foci of hypermetabolism identified in bony anatomy raising concern for skeletal metastases.  Comparison of the PET to CT 07/16/2020-lobular left lower lobe pulmonary nodule measured 2.4 x 1.5 cm on the prior study, current study it measured 1.8 x 1.3 cm.  Lesion in the anterior left acetabulum is similar to the 07/16/2020 exam although overlying cortical thinning slightly more pronounced on the current study.  Described lesion in the scapula shows some cortical sclerosis and a tiny central marrow lucency not substantially changed compared to 07/16/2020.  Left third rib lesion shows heterogeneous mineralization similar to 07/16/2020. MRI of cervical spine 01/13/2021-increased left facet edema at C5-C6, severe facet arthrosis on the left at C7-T1 and on the right at C3-C4, no evidence of metastatic disease Cycle 28 Pembrolizumab 01/14/2021 Cycle 29 Pembrolizumab 02/03/2021 Cycle 30 Pembrolizumab 02/24/2021 MRI left hip 03/02/2021-compared to 04/29/2019, slight increase in size of metastases at the left iliac and left acetabulum.  Lesion at the left upper sacrum is less  conspicuous and a lesion at the left superior pubic ramus is stable Cycle 31 pembrolizumab 03/17/2021 Radiation to left acetabulum and left iliac 03/30/2021-04/13/2021 Cycle 32 pembrolizumab 04/07/2021 Cycle 33 Pembrolizumab 04/28/2021 Cycle 34 pembrolizumab 05/18/2021 PET scan 06/01/2021-no significant change in size or degree of FDG uptake associated with FDG avid left lower lobe lung nodule compatible with neoplasm.  Stable to improved appearance of multifocal FDG avid bone metastasis. Cycle 35 pembrolizumab 06/10/2021 Cycle 36 pembrolizumab 07/01/2021 Cycle 37 pembrolizumab 07/29/2021 Cycle 38 pembrolizumab 08/19/2021 Cycle 39 Pembrolizumab 09/09/2021 Cycle 40 pembrolizumab 09/30/2021 CT chest 10/20/2021 to evaluate complaints of chest pain-no PE.  Interval increase in left lower lobe mass. Cycle 41 Pembrolizumab 10/21/2021 Cycle 42 pembrolizumab 11/11/2021 PET 11/24/2021-mild increase in size of left lower lobe mass, no evidence of solid organ or nodal metastases, stable multifocal bone metastases.  No new sites of metastatic disease. Cycle 43 pembrolizumab 11/29/2021 Cycle 45 Pembrolizumab 12/20/2021 Cycle 46 Pembrolizumab 01/09/2022 Cycle 47 pembrolizumab 02/01/2022 Cycle 48 Pembrolizumab 02/24/2022 Cycle 49 Pembrolizumab 03/17/2022 Cycle 50 pembrolizumab 04/07/2022 MRI l pelvis 04/12/2022-"new "lesion at the left superior acetabulum, stable bone lesions at the left suprapubic ramus, left S1, and left iliac Cycle 51 pembrolizumab 04/28/2022 Palliative radiation to the left hip and left scapula 04/26/2022-05/09/2022 Cycle 52 pembrolizumab 05/18/2022 PET 06/02/2022-stable left lower lobe tumor, slight increase in hypermetabolism associated with the left acetabular metastasis, additional previously seen bone lesions no longer have hypermetabolism Cycle 53 pembrolizumab 06/08/2022 Pain secondary to metastatic lung cancer Chronic back pain Type 2 diabetes Essential hypertension CAD Hyperlipidemia Family  history significant for multiple members with breast cancer Grade 1 skin rash 07/18/2019 likely related to immunotherapy.  Topical steroid cream as needed. E. coli urinary tract infection 07/14/2019.  Completed cephalexin. Edema/tenderness at the right greater than left ankle  10/21/2019-etiology unclear, potentially related to systemic therapy or an infection, doxycycline prescribed-improved 10/23/2019; marked improvement 11/20/2019; at office visit 01/02/2020 she reports worsening of lower extremity edema, pain/tenderness, erythema 3 to 4 days following each treatment.  Alimta held 01/02/2020.  Referral to dermatology. COVID-19 infection 03/30/2020, monoclonal antibody therapy 04/06/2020 Arthritis, possibly related to Pembrolizumab, improved since beginning Medrol, now followed by Dr. Amil Amen Pain left femoral head/upper femur/groin-negative plain x-ray; CT pelvis 03/28/2022-no acute appearing bone abnormality at the left hip or involving the proximal left femur, no evidence of acute fracture or dislocation, soft tissues left hip and left groin unremarkable; osseous mets within the left sacrum, left iliac bone and anterior acetabulum without evidence of associated fracture or dislocation.        Disposition: Ms. Salay continues to have severe pain to left hip.  The pain appears to be related to a metastasis at the left acetabulum.  The area has received radiation.  She will continue hydrocodone for pain.  I will refer her to orthopedic oncology to consider whether she is a candidate for surgical intervention at the left acetabulum.  I reviewed the PET findings and images with her.  There is no evidence of progressive disease and previously noted bone metastases no longer have hypermetabolic activity.  The left lung lesion is stable.  I recommend continuing pembrolizumab.  She will complete another cycle today. Ms. Jolicoeur will return for an office visit and pembrolizumab in 3 weeks.  [name],  MD  06/08/2022  11:50 AM

## 2022-06-09 ENCOUNTER — Other Ambulatory Visit: Payer: Self-pay | Admitting: Nurse Practitioner

## 2022-06-09 LAB — T4: T4, Total: 6.2 ug/dL (ref 4.5–12.0)

## 2022-06-09 NOTE — Progress Notes (Signed)
Call to [facility] to upload PET from 11/3 to [facility] for [place] app with Dr Redmond Pulling 11/16.

## 2022-06-11 ENCOUNTER — Encounter: Payer: Self-pay | Admitting: Cardiovascular Disease

## 2022-06-11 ENCOUNTER — Other Ambulatory Visit: Payer: Self-pay

## 2022-06-13 ENCOUNTER — Other Ambulatory Visit: Payer: Self-pay

## 2022-06-19 ENCOUNTER — Ambulatory Visit
Admission: RE | Admit: 2022-06-19 | Discharge: 2022-06-19 | Disposition: A | Discharge status: Home / Self Care | Payer: Medicare Other | Source: Ambulatory Visit | Attending: Radiation Oncology | Admitting: Radiation Oncology

## 2022-06-19 DIAGNOSIS — Z5112 Encounter for antineoplastic immunotherapy: Secondary | ICD-10-CM | POA: Insufficient documentation

## 2022-06-19 DIAGNOSIS — Z87891 Personal history of nicotine dependence: Secondary | ICD-10-CM | POA: Insufficient documentation

## 2022-06-19 DIAGNOSIS — C3432 Malignant neoplasm of lower lobe, left bronchus or lung: Secondary | ICD-10-CM | POA: Insufficient documentation

## 2022-06-19 DIAGNOSIS — Z510 Encounter for antineoplastic radiation therapy: Secondary | ICD-10-CM | POA: Insufficient documentation

## 2022-06-19 DIAGNOSIS — Z79899 Other long term (current) drug therapy: Secondary | ICD-10-CM | POA: Insufficient documentation

## 2022-06-19 DIAGNOSIS — C7951 Secondary malignant neoplasm of bone: Secondary | ICD-10-CM | POA: Insufficient documentation

## 2022-06-19 NOTE — Progress Notes (Signed)
  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   Intent: Palliative  Radiation Treatment Dates: 04/26/2022 through 05/09/2022 Site Technique Total Dose (Gy) Dose per Fx (Gy) Completed Fx Beam Energies  Hip, Left: Pelvis_L 3D 30/30 3 10/10 10X, 15X  Scapula, Left: Chest_L Complex 30/30 3 10/10 10X  (as documented in provider EOT note)   The patient was available for call today.  The patient did note fatigue during radiation. The patient did not note skin changes in the field of radiation during therapy. The patient has not noticed improvement in pain in the area(s) treated with radiation. Patient reports a LT hip hairline fracture, pain is 10/10, managed w/ a steroid injection. Dr. Learta Codding referred patient to orthopedic oncology at [facility] in [place] for pain management of LT hip. The patient is not taking dexamethasone. The patient does not have symptoms of  weakness or loss of control of the extremities. The patient does not have symptoms of headache. The patient does not have symptoms of seizure or uncontrolled movement. The patient does not have symptoms of changes in vision. The patient does not have changes in speech. The patient does not have confusion.  The patient is scheduled for ongoing care with Dr. Learta Codding in medical oncology on 06/29/22 and 07/25/22. The patient was encouraged to call if she develops concerns or questions regarding radiation.   This concludes this nursing interview.  Leandra Kern, LPN

## 2022-06-20 ENCOUNTER — Encounter: Payer: Self-pay | Admitting: Oncology

## 2022-06-25 ENCOUNTER — Other Ambulatory Visit: Payer: Self-pay | Admitting: Oncology

## 2022-06-29 ENCOUNTER — Encounter: Payer: Self-pay | Admitting: *Deleted

## 2022-06-29 ENCOUNTER — Inpatient Hospital Stay: Payer: Medicare Other

## 2022-06-29 ENCOUNTER — Inpatient Hospital Stay (HOSPITAL_BASED_OUTPATIENT_CLINIC_OR_DEPARTMENT_OTHER): Payer: Medicare Other | Admitting: Oncology

## 2022-06-29 VITALS — BP 154/60 | HR 65 | Temp 98.4°F | Resp 18 | Ht 65.0 in | Wt 184.4 lb

## 2022-06-29 VITALS — BP 131/51 | HR 57

## 2022-06-29 DIAGNOSIS — C3492 Malignant neoplasm of unspecified part of left bronchus or lung: Secondary | ICD-10-CM

## 2022-06-29 DIAGNOSIS — Z5112 Encounter for antineoplastic immunotherapy: Secondary | ICD-10-CM | POA: Diagnosis not present

## 2022-06-29 LAB — CMP (CANCER CENTER ONLY)
ALT: 19 U/L (ref 0–44)
AST: 19 U/L (ref 15–41)
Albumin: 4.5 g/dL (ref 3.5–5.0)
Alkaline Phosphatase: 53 U/L (ref 38–126)
Anion gap: 7 (ref 5–15)
BUN: 26 mg/dL — ABNORMAL HIGH (ref 8–23)
CO2: 27 mmol/L (ref 22–32)
Calcium: 10 mg/dL (ref 8.9–10.3)
Chloride: 100 mmol/L (ref 98–111)
Creatinine: 0.95 mg/dL (ref 0.44–1.00)
GFR, Estimated: 59 mL/min — ABNORMAL LOW (ref >60)
Glucose, Bld: 112 mg/dL — ABNORMAL HIGH (ref 70–99)
Potassium: 4.4 mmol/L (ref 3.5–5.1)
Sodium: 134 mmol/L — ABNORMAL LOW (ref 135–145)
Total Bilirubin: 0.5 mg/dL (ref 0.3–1.2)
Total Protein: 7.1 g/dL (ref 6.5–8.1)

## 2022-06-29 LAB — CBC WITH DIFFERENTIAL (CANCER CENTER ONLY)
Abs Immature Granulocytes: 0.01 10*3/uL (ref 0.00–0.07)
Basophils Absolute: 0 10*3/uL (ref 0.0–0.1)
Basophils Relative: 0 %
Eosinophils Absolute: 0 10*3/uL (ref 0.0–0.5)
Eosinophils Relative: 1 %
HCT: 36 % (ref 36.0–46.0)
Hemoglobin: 11.7 g/dL — ABNORMAL LOW (ref 12.0–15.0)
Immature Granulocytes: 0 %
Lymphocytes Relative: 14 %
Lymphs Abs: 0.7 10*3/uL (ref 0.7–4.0)
MCH: 30.6 pg (ref 26.0–34.0)
MCHC: 32.5 g/dL (ref 30.0–36.0)
MCV: 94.2 fL (ref 80.0–100.0)
Monocytes Absolute: 0.5 10*3/uL (ref 0.1–1.0)
Monocytes Relative: 9 %
Neutro Abs: 3.9 10*3/uL (ref 1.7–7.7)
Neutrophils Relative %: 76 %
Platelet Count: 145 10*3/uL — ABNORMAL LOW (ref 150–400)
RBC: 3.82 MIL/uL — ABNORMAL LOW (ref 3.87–5.11)
RDW: 13.3 % (ref 11.5–15.5)
WBC Count: 5.1 10*3/uL (ref 4.0–10.5)
nRBC: 0 % (ref 0.0–0.2)

## 2022-06-29 MED ORDER — SODIUM CHLORIDE 0.9% FLUSH
10.0000 mL | INTRAVENOUS | Status: DC | PRN
  Administered 2022-06-29: 10 mL

## 2022-06-29 MED ORDER — HEPARIN SOD (PORK) LOCK FLUSH 100 UNIT/ML IV SOLN
500.0000 [IU] | Freq: Once | INTRAVENOUS | Status: AC | PRN
  Administered 2022-06-29: 500 [IU]

## 2022-06-29 MED ORDER — SODIUM CHLORIDE 0.9 % IV SOLN: Freq: Once | INTRAVENOUS | Status: AC

## 2022-06-29 MED ORDER — SODIUM CHLORIDE 0.9 % IV SOLN
200.0000 mg | Freq: Once | INTRAVENOUS | Status: AC
  Administered 2022-06-29: 200 mg via INTRAVENOUS
  Filled 2022-06-29: qty 8

## 2022-06-29 NOTE — Progress Notes (Signed)
[facility] OFFICE PROGRESS NOTE   Diagnosis: Non-small cell lung cancer  INTERVAL HISTORY:   [name] completed another treatment pembrolizumab on 06/08/2022.  She generally feels well.  She continues to have pain with movement and weightbearing at the left hip.  She feels the pain in the left groin. She saw Dr. Redmond Pulling on 06/15/2022.  He is considering surgical intervention for the left acetabular fracture.  She is scheduled for a follow-up appointment at [facility] in January.  She takes hydrocodone as needed for pain.  Objective:  Vital signs in last 24 hours:  Blood pressure (!) 154/60, pulse 65, temperature 98.4 F (36.9 C), temperature source Oral, resp. rate 18, height _0  (1.651 m), weight 184 lb 6.4 oz (83.6 kg), SpO2 98 %.  Resp: Lungs clear bilaterally Cardio: Rate and rhythm GI: No hepatosplenomegaly Vascular: No leg edema Musculoskeletal: Pain with motion at the left hip  Portacath/PICC-without erythema  Lab Results:  Lab Results  Component Value Date   WBC 5.1 06/29/2022   HGB 11.7 (L) 06/29/2022   HCT 36.0 06/29/2022   MCV 94.2 06/29/2022   PLT 145 (L) 06/29/2022   NEUTROABS 3.9 06/29/2022    CMP  Lab Results  Component Value Date   NA 134 (L) 06/29/2022   K 4.4 06/29/2022   CL 100 06/29/2022   CO2 27 06/29/2022   GLUCOSE 112 (H) 06/29/2022   BUN 26 (H) 06/29/2022   CREATININE 0.95 06/29/2022   CALCIUM 10.0 06/29/2022   PROT 7.1 06/29/2022   ALBUMIN 4.5 06/29/2022   AST 19 06/29/2022   ALT 19 06/29/2022   ALKPHOS 53 06/29/2022   BILITOT 0.5 06/29/2022   GFRNONAA 59 (L) 06/29/2022   GFRAA >60 04/21/2020     Medications: I have reviewed the patient's current medications.   Assessment/Plan: Non-small cell lung cancer MRI lumbar spine 04/29/2019- enlarging marrow lesions involving the L1 vertebral body, upper left sacrum and right iliac bone MRI pelvis 04/29/2019- 3.5 cm left iliac bone lesion appears slightly larger;  other similar appearing lesions present within the left superior pubic ramus, left superior abdomen acetabulum and upper left sacrum Kappa free light chains with mild elevation 05/12/2019  CTs 05/12/2019- left lower lobe pulmonary mass 3.3 x 3.2 cm; lytic process left iliac bone; spinal lesions; 1.1 cm low-density left kidney lesion; right thyroid enlargement with heterogeneous appearance with potential for multiple discrete lesions Biopsy left lower lobe lung mass 05/26/2019-poorly differentiated carcinoma; positive for cytokeratin 5/6, p63 and TTF-1, no EGFR, BRAF, ALK, ERBB2,ROS, or NTRK alteration Cycle 1 carboplatin/Alimta/pembrolizumab 06/06/2019 Cycle 2 carboplatin/Alimta/pembrolizumab 06/27/2019 Cycle 3 carboplatin/Alimta/pembrolizumab 07/18/2019 Cycle 4 carboplatin/Alimta/pembrolizumab 08/07/2019 CTs 08/27/2019-significant decrease in size of lobulated mass left lower lobe.  Unchanged appearance of subtle bone lesions. Cycle 5 Alimta/pembrolizumab 08/28/2019 Cycle 6 Alimta/pembrolizumab 09/18/2019 Cycle 7 Alimta/pembrolizumab 10/09/2019 Cycle 8 Alimta/pembrolizumab 10/30/2019 Cycle 9 Alimta/pembrolizumab 11/20/2019 CTs 12/09/2019-no evidence of disease progression, left lower lobe nodule slightly decreased in size, stable L1, left sacral, and left pubic ramus metastases Cycle 10 Alimta/pembrolizumab 12/11/2019 Cycle 11 pembrolizumab alone 01/02/2020 (Alimta held due to edema, tenderness, erythema at the lower legs) Cycle 12 pembrolizumab 01/23/2020 Cycle 13 pembrolizumab 02/12/2020 Cycle 14 pembrolizumab 03/04/2020 CTs 03/18/2020-stable left lower lobe lesion, mild sclerosis at the superior endplate of L1 that was previously hypermetabolic, stable small left upper sacral lucent lesion, previous left superior pubic ramus lesion is occult on the CT, CT head negative for malignancy Cycle 15 pembrolizumab 03/25/2020 Cycle 16 pembrolizumab 04/21/2020 Cycle 17 pembrolizumab 05/17/2020  05/19/2020 bone scan-no  definite abnormalities to suggest osseous metastases.  Areas of concern on prior PET-CT involving left iliac bone and L1 vertebral body showed no abnormalities on the current study Cycle 18 Pembrolizumab 06/10/2020 Cycle 19 Pembrolizumab 06/30/2020 CTs 07/16/2020-stable left lower lobe nodule, stable faint superior L1 vertebral lesion, no evidence of disease progression Cycle 20 pembrolizumab 07/21/2020 Cycle 21 Pembrolizumab 08/11/2020 Cycle 22 pembrolizumab 09/01/2020 Cycle 23 Pembrolizumab 09/22/2020 Cycle 24 pembrolizumab 10/13/2020 Cycle 25 Pembrolizumab 11/03/2020 Cycle 26 pembrolizumab 11/26/2020 CTs 12/15/2020- stable left lower lobe mass, stable sclerotic lesion at L2, no evidence of disease progression Cycle 27 pembrolizumab 12/17/2020 PET scan 01/03/2021-1.8 x 1.3 cm left lower lobe nodule similar in size to CT of 12/15/2020 and measures smaller than previous PET/CT from 2020.  Nodule is markedly hypermetabolic.  No evidence for hypermetabolic hilar or mediastinal lymphadenopathy.  Several tiny foci of hypermetabolism identified in bony anatomy raising concern for skeletal metastases.  Comparison of the PET to CT 07/16/2020-lobular left lower lobe pulmonary nodule measured 2.4 x 1.5 cm on the prior study, current study it measured 1.8 x 1.3 cm.  Lesion in the anterior left acetabulum is similar to the 07/16/2020 exam although overlying cortical thinning slightly more pronounced on the current study.  Described lesion in the scapula shows some cortical sclerosis and a tiny central marrow lucency not substantially changed compared to 07/16/2020.  Left third rib lesion shows heterogeneous mineralization similar to 07/16/2020. MRI of cervical spine 01/13/2021-increased left facet edema at C5-C6, severe facet arthrosis on the left at C7-T1 and on the right at C3-C4, no evidence of metastatic disease Cycle 28 Pembrolizumab 01/14/2021 Cycle 29 Pembrolizumab 02/03/2021 Cycle 30 Pembrolizumab 02/24/2021 MRI left  hip 03/02/2021-compared to 04/29/2019, slight increase in size of metastases at the left iliac and left acetabulum.  Lesion at the left upper sacrum is less conspicuous and a lesion at the left superior pubic ramus is stable Cycle 31 pembrolizumab 03/17/2021 Radiation to left acetabulum and left iliac 03/30/2021-04/13/2021 Cycle 32 pembrolizumab 04/07/2021 Cycle 33 Pembrolizumab 04/28/2021 Cycle 34 pembrolizumab 05/18/2021 PET scan 06/01/2021-no significant change in size or degree of FDG uptake associated with FDG avid left lower lobe lung nodule compatible with neoplasm.  Stable to improved appearance of multifocal FDG avid bone metastasis. Cycle 35 pembrolizumab 06/10/2021 Cycle 36 pembrolizumab 07/01/2021 Cycle 37 pembrolizumab 07/29/2021 Cycle 38 pembrolizumab 08/19/2021 Cycle 39 Pembrolizumab 09/09/2021 Cycle 40 pembrolizumab 09/30/2021 CT chest 10/20/2021 to evaluate complaints of chest pain-no PE.  Interval increase in left lower lobe mass. Cycle 41 Pembrolizumab 10/21/2021 Cycle 42 pembrolizumab 11/11/2021 PET 11/24/2021-mild increase in size of left lower lobe mass, no evidence of solid organ or nodal metastases, stable multifocal bone metastases.  No new sites of metastatic disease. Cycle 43 pembrolizumab 11/29/2021 Cycle 45 Pembrolizumab 12/20/2021 Cycle 46 Pembrolizumab 01/09/2022 Cycle 47 pembrolizumab 02/01/2022 Cycle 48 Pembrolizumab 02/24/2022 Cycle 49 Pembrolizumab 03/17/2022 Cycle 50 pembrolizumab 04/07/2022 MRI l pelvis 04/12/2022-"new "lesion at the left superior acetabulum, stable bone lesions at the left suprapubic ramus, left S1, and left iliac Cycle 51 pembrolizumab 04/28/2022 Palliative radiation to the left hip and left scapula 04/26/2022-05/09/2022 Cycle 52 pembrolizumab 05/18/2022 PET 06/02/2022-stable left lower lobe tumor, slight increase in hypermetabolism associated with the left acetabular metastasis, additional previously seen bone lesions no longer have hypermetabolism Cycle 53  pembrolizumab 06/08/2022 Cycle 54 pembrolizumab 06/29/2022 Pain secondary to metastatic lung cancer Chronic back pain Type 2 diabetes Essential hypertension CAD Hyperlipidemia Family history significant for multiple members with breast cancer Grade 1 skin rash 07/18/2019  likely related to immunotherapy.  Topical steroid cream as needed. E. coli urinary tract infection 07/14/2019.  Completed cephalexin. Edema/tenderness at the right greater than left ankle 10/21/2019-etiology unclear, potentially related to systemic therapy or an infection, doxycycline prescribed-improved 10/23/2019; marked improvement 11/20/2019; at office visit 01/02/2020 she reports worsening of lower extremity edema, pain/tenderness, erythema 3 to 4 days following each treatment.  Alimta held 01/02/2020.  Referral to dermatology. COVID-19 infection 03/30/2020, monoclonal antibody therapy 04/06/2020 Arthritis, possibly related to Pembrolizumab, improved since beginning Medrol, now followed by Dr. Amil Amen Pain left femoral head/upper femur/groin-negative plain x-ray; CT pelvis 03/28/2022-no acute appearing bone abnormality at the left hip or involving the proximal left femur, no evidence of acute fracture or dislocation, soft tissues left hip and left groin unremarkable; osseous mets within the left sacrum, left iliac bone and anterior acetabulum without evidence of associated fracture or dislocation.       Disposition: [name] appears unchanged.  She continues to have pain at the left hip related to a metastasis/fracture at the left acetabulum.  She plans to follow-up with orthopedics to see whether there is a surgical option for relieving her pain.  There is no additional evidence of progressive metastatic disease.  She will continue every 3-week pembrolizumab.  She will complete another treatment today.  [name] will return for an office visit and pembrolizumab in 3 weeks.  [name], MD  06/29/2022  11:02 AM

## 2022-06-29 NOTE — Patient Instructions (Signed)
[facility]   Discharge Instructions: Thank you for choosing [facility] to provide your oncology and hematology care.   If you have a lab appointment with the [facility], please go directly to the [facility] and check in at the registration area.   Wear comfortable clothing and clothing appropriate for easy access to any Portacath or PICC line.   We strive to give you quality time with your provider. You may need to reschedule your appointment if you arrive late (15 or more minutes).  Arriving late affects you and other patients whose appointments are after yours.  Also, if you miss three or more appointments without notifying the office, you may be dismissed from the clinic at the provider's discretion.      For prescription refill requests, have your pharmacy contact our office and allow 72 hours for refills to be completed.    Today you received the following chemotherapy and/or immunotherapy agents Keytruda      To help prevent nausea and vomiting after your treatment, we encourage you to take your nausea medication as directed.  BELOW ARE SYMPTOMS THAT SHOULD BE REPORTED IMMEDIATELY: *FEVER GREATER THAN 100.4 F (38 C) OR HIGHER *CHILLS OR SWEATING *NAUSEA AND VOMITING THAT IS NOT CONTROLLED WITH YOUR NAUSEA MEDICATION *UNUSUAL SHORTNESS OF BREATH *UNUSUAL BRUISING OR BLEEDING *URINARY PROBLEMS (pain or burning when urinating, or frequent urination) *BOWEL PROBLEMS (unusual diarrhea, constipation, pain near the anus) TENDERNESS IN MOUTH AND THROAT WITH OR WITHOUT PRESENCE OF ULCERS (sore throat, sores in mouth, or a toothache) UNUSUAL RASH, SWELLING OR PAIN  UNUSUAL VAGINAL DISCHARGE OR ITCHING   Items with * indicate a potential emergency and should be followed up as soon as possible or go to the Emergency Department if any problems should occur.  Please show the CHEMOTHERAPY ALERT CARD or IMMUNOTHERAPY ALERT CARD at check-in to the  Emergency Department and triage nurse.  Should you have questions after your visit or need to cancel or reschedule your appointment, please contact [facility]  Dept: [phone]  and follow the prompts.  Office hours are 8:00 a.m. to 4:30 p.m. Monday - Friday. Please note that voicemails left after 4:00 p.m. may not be returned until the following business day.  We are closed weekends and major holidays. You have access to a nurse at all times for urgent questions. Please call the main number to the clinic Dept: [phone] and follow the prompts.   For any non-urgent questions, you may also contact your provider using MyChart. We now offer e-Visits for anyone 44 and older to request care online for non-urgent symptoms. For details visit [email or web address].   Also download the MyChart app! Go to the app store, search "MyChart", open the app, select [facility], and log in with your MyChart username and password.  Masks are optional in the cancer centers. If you would like for your care team to wear a mask while they are taking care of you, please let them know. You may have one support person who is at least 85 years old accompany you for your appointments.  Pembrolizumab Injection What is this medication? PEMBROLIZUMAB (PEM broe LIZ ue mab) treats some types of cancer. It works by helping your immune system slow or stop the spread of cancer cells. It is a monoclonal antibody. This medicine may be used for other purposes; ask your health care provider or pharmacist if you have questions. COMMON BRAND NAME(S): [employer]  What should I tell my care team before I take this medication? They need to know if you have any of these conditions: Allogeneic stem cell transplant (uses someone else's stem cells) Autoimmune diseases, such as Crohn disease, ulcerative colitis, lupus History of chest radiation Nervous system problems, such as Guillain-Barre syndrome, myasthenia  gravis Organ transplant An unusual or allergic reaction to pembrolizumab, other medications, foods, dyes, or preservatives Pregnant or trying to get pregnant Breast-feeding How should I use this medication? This medication is injected into a vein. It is given by your care team in a hospital or clinic setting. A special MedGuide will be given to you before each treatment. Be sure to read this information carefully each time. Talk to your care team about the use of this medication in children. While it may be prescribed for children as young as 6 months for selected conditions, precautions do apply. Overdosage: If you think you have taken too much of this medicine contact a poison control center or emergency room at once. NOTE: This medicine is only for you. Do not share this medicine with others. What if I miss a dose? Keep appointments for follow-up doses. It is important not to miss your dose. Call your care team if you are unable to keep an appointment. What may interact with this medication? Interactions have not been studied. This list may not describe all possible interactions. Give your health care provider a list of all the medicines, herbs, non-prescription drugs, or dietary supplements you use. Also tell them if you smoke, drink alcohol, or use illegal drugs. Some items may interact with your medicine. What should I watch for while using this medication? Your condition will be monitored carefully while you are receiving this medication. You may need blood work while taking this medication. This medication may cause serious skin reactions. They can happen weeks to months after starting the medication. Contact your care team right away if you notice fevers or flu-like symptoms with a rash. The rash may be red or purple and then turn into blisters or peeling of the skin. You may also notice a red rash with swelling of the face, lips, or lymph nodes in your neck or under your arms. Tell your  care team right away if you have any change in your eyesight. Talk to your care team if you may be pregnant. Serious birth defects can occur if you take this medication during pregnancy and for 4 months after the last dose. You will need a negative pregnancy test before starting this medication. Contraception is recommended while taking this medication and for 4 months after the last dose. Your care team can help you find the option that works for you. Do not breastfeed while taking this medication and for 4 months after the last dose. What side effects may I notice from receiving this medication? Side effects that you should report to your care team as soon as possible: Allergic reactions--skin rash, itching, hives, swelling of the face, lips, tongue, or throat Dry cough, shortness of breath or trouble breathing Eye pain, redness, irritation, or discharge with blurry or decreased vision Heart muscle inflammation--unusual weakness or fatigue, shortness of breath, chest pain, fast or irregular heartbeat, dizziness, swelling of the ankles, feet, or hands Hormone gland problems--headache, sensitivity to light, unusual weakness or fatigue, dizziness, fast or irregular heartbeat, increased sensitivity to cold or heat, excessive sweating, constipation, hair loss, increased thirst or amount of urine, tremors or shaking, irritability Infusion reactions--chest pain, shortness of  breath or trouble breathing, feeling faint or lightheaded Kidney injury (glomerulonephritis)--decrease in the amount of urine, red or dark brown urine, foamy or bubbly urine, swelling of the ankles, hands, or feet Liver injury--right upper belly pain, loss of appetite, nausea, light-colored stool, dark yellow or brown urine, yellowing skin or eyes, unusual weakness or fatigue Pain, tingling, or numbness in the hands or feet, muscle weakness, change in vision, confusion or trouble speaking, loss of balance or coordination, trouble  walking, seizures Rash, fever, and swollen lymph nodes Redness, blistering, peeling, or loosening of the skin, including inside the mouth Sudden or severe stomach pain, bloody diarrhea, fever, nausea, vomiting Side effects that usually do not require medical attention (report to your care team if they continue or are bothersome): Bone, joint, or muscle pain Diarrhea Fatigue Loss of appetite Nausea Skin rash This list may not describe all possible side effects. Call your doctor for medical advice about side effects. You may report side effects to FDA at 1-800-FDA-1088. Where should I keep my medication? This medication is given in a hospital or clinic. It will not be stored at home. NOTE: This sheet is a summary. It may not cover all possible information. If you have questions about this medicine, talk to your doctor, pharmacist, or health care provider.  2023 Elsevier/Gold Standard (2013-04-07 00:00:00)

## 2022-06-29 NOTE — Progress Notes (Signed)
Patient seen by Dr. Benay Spice today  Vitals are within treatment parameters. No intervention needed for BP 154/60  Labs reviewed by Dr. Benay Spice and are within treatment parameters.  Per physician team, patient is ready for treatment and there are NO modifications to the treatment plan.

## 2022-06-29 NOTE — Patient Instructions (Signed)

## 2022-06-30 ENCOUNTER — Other Ambulatory Visit: Payer: Self-pay

## 2022-07-03 ENCOUNTER — Other Ambulatory Visit: Payer: Self-pay

## 2022-07-20 ENCOUNTER — Inpatient Hospital Stay: Payer: Medicare Other

## 2022-07-20 ENCOUNTER — Ambulatory Visit: Payer: Medicare Other | Admitting: Oncology

## 2022-07-20 ENCOUNTER — Inpatient Hospital Stay: Payer: Medicare Other | Attending: Oncology

## 2022-07-20 ENCOUNTER — Other Ambulatory Visit: Payer: Medicare Other

## 2022-07-20 ENCOUNTER — Inpatient Hospital Stay (HOSPITAL_BASED_OUTPATIENT_CLINIC_OR_DEPARTMENT_OTHER): Payer: Medicare Other | Admitting: Oncology

## 2022-07-20 ENCOUNTER — Ambulatory Visit: Payer: Medicare Other

## 2022-07-20 VITALS — BP 121/70 | HR 58 | Temp 98.1°F | Resp 18 | Ht 65.0 in | Wt 185.0 lb

## 2022-07-20 VITALS — BP 114/54 | HR 54

## 2022-07-20 DIAGNOSIS — E119 Type 2 diabetes mellitus without complications: Secondary | ICD-10-CM | POA: Diagnosis not present

## 2022-07-20 DIAGNOSIS — C7951 Secondary malignant neoplasm of bone: Secondary | ICD-10-CM | POA: Insufficient documentation

## 2022-07-20 DIAGNOSIS — I10 Essential (primary) hypertension: Secondary | ICD-10-CM | POA: Diagnosis not present

## 2022-07-20 DIAGNOSIS — C3492 Malignant neoplasm of unspecified part of left bronchus or lung: Secondary | ICD-10-CM | POA: Diagnosis not present

## 2022-07-20 DIAGNOSIS — M25511 Pain in right shoulder: Secondary | ICD-10-CM | POA: Insufficient documentation

## 2022-07-20 DIAGNOSIS — M25552 Pain in left hip: Secondary | ICD-10-CM | POA: Insufficient documentation

## 2022-07-20 DIAGNOSIS — E785 Hyperlipidemia, unspecified: Secondary | ICD-10-CM | POA: Diagnosis not present

## 2022-07-20 DIAGNOSIS — C3432 Malignant neoplasm of lower lobe, left bronchus or lung: Secondary | ICD-10-CM | POA: Insufficient documentation

## 2022-07-20 DIAGNOSIS — Z79899 Other long term (current) drug therapy: Secondary | ICD-10-CM | POA: Insufficient documentation

## 2022-07-20 DIAGNOSIS — Z5112 Encounter for antineoplastic immunotherapy: Secondary | ICD-10-CM | POA: Diagnosis present

## 2022-07-20 DIAGNOSIS — I2510 Atherosclerotic heart disease of native coronary artery without angina pectoris: Secondary | ICD-10-CM | POA: Diagnosis not present

## 2022-07-20 LAB — CBC WITH DIFFERENTIAL (CANCER CENTER ONLY)
Abs Immature Granulocytes: 0.01 10*3/uL (ref 0.00–0.07)
Basophils Absolute: 0 10*3/uL (ref 0.0–0.1)
Basophils Relative: 0 %
Eosinophils Absolute: 0 10*3/uL (ref 0.0–0.5)
Eosinophils Relative: 1 %
HCT: 34.2 % — ABNORMAL LOW (ref 36.0–46.0)
Hemoglobin: 11.1 g/dL — ABNORMAL LOW (ref 12.0–15.0)
Immature Granulocytes: 0 %
Lymphocytes Relative: 19 %
Lymphs Abs: 0.7 10*3/uL (ref 0.7–4.0)
MCH: 31 pg (ref 26.0–34.0)
MCHC: 32.5 g/dL (ref 30.0–36.0)
MCV: 95.5 fL (ref 80.0–100.0)
Monocytes Absolute: 0.4 10*3/uL (ref 0.1–1.0)
Monocytes Relative: 12 %
Neutro Abs: 2.4 10*3/uL (ref 1.7–7.7)
Neutrophils Relative %: 68 %
Platelet Count: 150 10*3/uL (ref 150–400)
RBC: 3.58 MIL/uL — ABNORMAL LOW (ref 3.87–5.11)
RDW: 13.4 % (ref 11.5–15.5)
WBC Count: 3.5 10*3/uL — ABNORMAL LOW (ref 4.0–10.5)
nRBC: 0 % (ref 0.0–0.2)

## 2022-07-20 LAB — CMP (CANCER CENTER ONLY)
ALT: 16 U/L (ref 0–44)
AST: 16 U/L (ref 15–41)
Albumin: 4 g/dL (ref 3.5–5.0)
Alkaline Phosphatase: 48 U/L (ref 38–126)
Anion gap: 9 (ref 5–15)
BUN: 33 mg/dL — ABNORMAL HIGH (ref 8–23)
CO2: 26 mmol/L (ref 22–32)
Calcium: 9.6 mg/dL (ref 8.9–10.3)
Chloride: 102 mmol/L (ref 98–111)
Creatinine: 0.92 mg/dL (ref 0.44–1.00)
GFR, Estimated: >60 mL/min (ref >60)
Glucose, Bld: 113 mg/dL — ABNORMAL HIGH (ref 70–99)
Potassium: 4.6 mmol/L (ref 3.5–5.1)
Sodium: 137 mmol/L (ref 135–145)
Total Bilirubin: 0.3 mg/dL (ref 0.3–1.2)
Total Protein: 6.3 g/dL — ABNORMAL LOW (ref 6.5–8.1)

## 2022-07-20 MED ORDER — SODIUM CHLORIDE 0.9% FLUSH
10.0000 mL | INTRAVENOUS | Status: DC | PRN
  Administered 2022-07-20: 10 mL

## 2022-07-20 MED ORDER — SODIUM CHLORIDE 0.9 % IV SOLN
200.0000 mg | Freq: Once | INTRAVENOUS | Status: AC
  Administered 2022-07-20: 200 mg via INTRAVENOUS
  Filled 2022-07-20: qty 8

## 2022-07-20 MED ORDER — HEPARIN SOD (PORK) LOCK FLUSH 100 UNIT/ML IV SOLN
500.0000 [IU] | Freq: Once | INTRAVENOUS | Status: AC | PRN
  Administered 2022-07-20: 500 [IU]

## 2022-07-20 MED ORDER — HYDROCODONE-ACETAMINOPHEN 10-325 MG PO TABS: 1.0000 | ORAL_TABLET | ORAL | 0 refills | Status: DC | PRN

## 2022-07-20 MED ORDER — SODIUM CHLORIDE 0.9 % IV SOLN: Freq: Once | INTRAVENOUS | Status: AC

## 2022-07-20 NOTE — Progress Notes (Signed)
[facility] OFFICE PROGRESS NOTE   Diagnosis: Non small cell lung cancer  INTERVAL HISTORY:   [name] completed another treatment pembrolizumab on 06/29/2022.  She continues to have pain at the left hip.  She takes one half of a hydrocodone tablet approximately 4 times daily.  She has increased pain in the right shoulder.  She relates this to using her walker and cane.  She also has increased pain at the mid upper back.  Objective:  Vital signs in last 24 hours:  Blood pressure 121/70, pulse (!) 58, temperature 98.1 F (36.7 C), temperature source Oral, resp. rate 18, height _0  (1.651 m), weight 185 lb (83.9 kg), SpO2 98 %.    Resp: End inspiratory rhonchi at the lower posterior chest bilaterally, no respiratory distress Cardio: Regular rate and rhythm GI: No hepatosplenomegaly Vascular: No leg edema Musculoskeletal: Tender to percussion at the mid upper back, pain with palpation of the left trochanter    Portacath/PICC-without erythema  Lab Results:  Lab Results  Component Value Date   WBC 3.5 (L) 07/20/2022   HGB 11.1 (L) 07/20/2022   HCT 34.2 (L) 07/20/2022   MCV 95.5 07/20/2022   PLT 150 07/20/2022   NEUTROABS 2.4 07/20/2022    CMP  Lab Results  Component Value Date   NA 134 (L) 06/29/2022   K 4.4 06/29/2022   CL 100 06/29/2022   CO2 27 06/29/2022   GLUCOSE 112 (H) 06/29/2022   BUN 26 (H) 06/29/2022   CREATININE 0.95 06/29/2022   CALCIUM 10.0 06/29/2022   PROT 7.1 06/29/2022   ALBUMIN 4.5 06/29/2022   AST 19 06/29/2022   ALT 19 06/29/2022   ALKPHOS 53 06/29/2022   BILITOT 0.5 06/29/2022   GFRNONAA 59 (L) 06/29/2022   GFRAA >60 04/21/2020    Medications: I have reviewed the patient's current medications.   Assessment/Plan: Non-small cell lung cancer MRI lumbar spine 04/29/2019- enlarging marrow lesions involving the L1 vertebral body, upper left sacrum and right iliac bone MRI pelvis 04/29/2019- 3.5 cm left iliac bone lesion  appears slightly larger; other similar appearing lesions present within the left superior pubic ramus, left superior abdomen acetabulum and upper left sacrum Kappa free light chains with mild elevation 05/12/2019  CTs 05/12/2019- left lower lobe pulmonary mass 3.3 x 3.2 cm; lytic process left iliac bone; spinal lesions; 1.1 cm low-density left kidney lesion; right thyroid enlargement with heterogeneous appearance with potential for multiple discrete lesions Biopsy left lower lobe lung mass 05/26/2019-poorly differentiated carcinoma; positive for cytokeratin 5/6, p63 and TTF-1, no EGFR, BRAF, ALK, ERBB2,ROS, or NTRK alteration Cycle 1 carboplatin/Alimta/pembrolizumab 06/06/2019 Cycle 2 carboplatin/Alimta/pembrolizumab 06/27/2019 Cycle 3 carboplatin/Alimta/pembrolizumab 07/18/2019 Cycle 4 carboplatin/Alimta/pembrolizumab 08/07/2019 CTs 08/27/2019-significant decrease in size of lobulated mass left lower lobe.  Unchanged appearance of subtle bone lesions. Cycle 5 Alimta/pembrolizumab 08/28/2019 Cycle 6 Alimta/pembrolizumab 09/18/2019 Cycle 7 Alimta/pembrolizumab 10/09/2019 Cycle 8 Alimta/pembrolizumab 10/30/2019 Cycle 9 Alimta/pembrolizumab 11/20/2019 CTs 12/09/2019-no evidence of disease progression, left lower lobe nodule slightly decreased in size, stable L1, left sacral, and left pubic ramus metastases Cycle 10 Alimta/pembrolizumab 12/11/2019 Cycle 11 pembrolizumab alone 01/02/2020 (Alimta held due to edema, tenderness, erythema at the lower legs) Cycle 12 pembrolizumab 01/23/2020 Cycle 13 pembrolizumab 02/12/2020 Cycle 14 pembrolizumab 03/04/2020 CTs 03/18/2020-stable left lower lobe lesion, mild sclerosis at the superior endplate of L1 that was previously hypermetabolic, stable small left upper sacral lucent lesion, previous left superior pubic ramus lesion is occult on the CT, CT head negative for malignancy Cycle 15 pembrolizumab 03/25/2020  Cycle 16 pembrolizumab 04/21/2020 Cycle 17 pembrolizumab  05/17/2020 05/19/2020 bone scan-no definite abnormalities to suggest osseous metastases.  Areas of concern on prior PET-CT involving left iliac bone and L1 vertebral body showed no abnormalities on the current study Cycle 18 Pembrolizumab 06/10/2020 Cycle 19 Pembrolizumab 06/30/2020 CTs 07/16/2020-stable left lower lobe nodule, stable faint superior L1 vertebral lesion, no evidence of disease progression Cycle 20 pembrolizumab 07/21/2020 Cycle 21 Pembrolizumab 08/11/2020 Cycle 22 pembrolizumab 09/01/2020 Cycle 23 Pembrolizumab 09/22/2020 Cycle 24 pembrolizumab 10/13/2020 Cycle 25 Pembrolizumab 11/03/2020 Cycle 26 pembrolizumab 11/26/2020 CTs 12/15/2020- stable left lower lobe mass, stable sclerotic lesion at L2, no evidence of disease progression Cycle 27 pembrolizumab 12/17/2020 PET scan 01/03/2021-1.8 x 1.3 cm left lower lobe nodule similar in size to CT of 12/15/2020 and measures smaller than previous PET/CT from 2020.  Nodule is markedly hypermetabolic.  No evidence for hypermetabolic hilar or mediastinal lymphadenopathy.  Several tiny foci of hypermetabolism identified in bony anatomy raising concern for skeletal metastases.  Comparison of the PET to CT 07/16/2020-lobular left lower lobe pulmonary nodule measured 2.4 x 1.5 cm on the prior study, current study it measured 1.8 x 1.3 cm.  Lesion in the anterior left acetabulum is similar to the 07/16/2020 exam although overlying cortical thinning slightly more pronounced on the current study.  Described lesion in the scapula shows some cortical sclerosis and a tiny central marrow lucency not substantially changed compared to 07/16/2020.  Left third rib lesion shows heterogeneous mineralization similar to 07/16/2020. MRI of cervical spine 01/13/2021-increased left facet edema at C5-C6, severe facet arthrosis on the left at C7-T1 and on the right at C3-C4, no evidence of metastatic disease Cycle 28 Pembrolizumab 01/14/2021 Cycle 29 Pembrolizumab 02/03/2021 Cycle 30  Pembrolizumab 02/24/2021 MRI left hip 03/02/2021-compared to 04/29/2019, slight increase in size of metastases at the left iliac and left acetabulum.  Lesion at the left upper sacrum is less conspicuous and a lesion at the left superior pubic ramus is stable Cycle 31 pembrolizumab 03/17/2021 Radiation to left acetabulum and left iliac 03/30/2021-04/13/2021 Cycle 32 pembrolizumab 04/07/2021 Cycle 33 Pembrolizumab 04/28/2021 Cycle 34 pembrolizumab 05/18/2021 PET scan 06/01/2021-no significant change in size or degree of FDG uptake associated with FDG avid left lower lobe lung nodule compatible with neoplasm.  Stable to improved appearance of multifocal FDG avid bone metastasis. Cycle 35 pembrolizumab 06/10/2021 Cycle 36 pembrolizumab 07/01/2021 Cycle 37 pembrolizumab 07/29/2021 Cycle 38 pembrolizumab 08/19/2021 Cycle 39 Pembrolizumab 09/09/2021 Cycle 40 pembrolizumab 09/30/2021 CT chest 10/20/2021 to evaluate complaints of chest pain-no PE.  Interval increase in left lower lobe mass. Cycle 41 Pembrolizumab 10/21/2021 Cycle 42 pembrolizumab 11/11/2021 PET 11/24/2021-mild increase in size of left lower lobe mass, no evidence of solid organ or nodal metastases, stable multifocal bone metastases.  No new sites of metastatic disease. Cycle 43 pembrolizumab 11/29/2021 Cycle 45 Pembrolizumab 12/20/2021 Cycle 46 Pembrolizumab 01/09/2022 Cycle 47 pembrolizumab 02/01/2022 Cycle 48 Pembrolizumab 02/24/2022 Cycle 49 Pembrolizumab 03/17/2022 Cycle 50 pembrolizumab 04/07/2022 MRI l pelvis 04/12/2022-"new "lesion at the left superior acetabulum, stable bone lesions at the left suprapubic ramus, left S1, and left iliac Cycle 51 pembrolizumab 04/28/2022 Palliative radiation to the left hip and left scapula 04/26/2022-05/09/2022 Cycle 52 pembrolizumab 05/18/2022 PET 06/02/2022-stable left lower lobe tumor, slight increase in hypermetabolism associated with the left acetabular metastasis, additional previously seen bone lesions no longer  have hypermetabolism Cycle 53 pembrolizumab 06/08/2022 Cycle 54 pembrolizumab 06/29/2022 Cycle 55 pembrolizumab 07/20/2022 Pain secondary to metastatic lung cancer Chronic back pain Type 2 diabetes Essential hypertension CAD Hyperlipidemia Family history  significant for multiple members with breast cancer Grade 1 skin rash 07/18/2019 likely related to immunotherapy.  Topical steroid cream as needed. E. coli urinary tract infection 07/14/2019.  Completed cephalexin. Edema/tenderness at the right greater than left ankle 10/21/2019-etiology unclear, potentially related to systemic therapy or an infection, doxycycline prescribed-improved 10/23/2019; marked improvement 11/20/2019; at office visit 01/02/2020 she reports worsening of lower extremity edema, pain/tenderness, erythema 3 to 4 days following each treatment.  Alimta held 01/02/2020.  Referral to dermatology. COVID-19 infection 03/30/2020, monoclonal antibody therapy 04/06/2020 Arthritis, possibly related to Pembrolizumab, improved since beginning Medrol, now followed by Dr. Amil Amen Pain left femoral head/upper femur/groin-negative plain x-ray; CT pelvis 03/28/2022-no acute appearing bone abnormality at the left hip or involving the proximal left femur, no evidence of acute fracture or dislocation, soft tissues left hip and left groin unremarkable; osseous mets within the left sacrum, left iliac bone and anterior acetabulum without evidence of associated fracture or dislocation.    Disposition: Ms. Perot appears unchanged.  She will continue pembrolizumab.  I suspect the right shoulder and upper back discomfort are related to a benign musculoskeletal condition.  She will continue follow-up with [facility] for evaluation of the left acetabulum lesion.  She will complete another treatment with pembrolizumab today.  Masella will return for an office visit in 3 weeks.  [name], MD  07/20/2022  8:17 AM

## 2022-07-20 NOTE — Patient Instructions (Signed)
[facility]   Discharge Instructions: Thank you for choosing [facility] to provide your oncology and hematology care.   If you have a lab appointment with the [facility], please go directly to the [facility] and check in at the registration area.   Wear comfortable clothing and clothing appropriate for easy access to any Portacath or PICC line.   We strive to give you quality time with your provider. You may need to reschedule your appointment if you arrive late (15 or more minutes).  Arriving late affects you and other patients whose appointments are after yours.  Also, if you miss three or more appointments without notifying the office, you may be dismissed from the clinic at the provider's discretion.      For prescription refill requests, have your pharmacy contact our office and allow 72 hours for refills to be completed.    Today you received the following chemotherapy and/or immunotherapy agents Pembrolizumab (KEYTRUDA).      To help prevent nausea and vomiting after your treatment, we encourage you to take your nausea medication as directed.  BELOW ARE SYMPTOMS THAT SHOULD BE REPORTED IMMEDIATELY: *FEVER GREATER THAN 100.4 F (38 C) OR HIGHER *CHILLS OR SWEATING *NAUSEA AND VOMITING THAT IS NOT CONTROLLED WITH YOUR NAUSEA MEDICATION *UNUSUAL SHORTNESS OF BREATH *UNUSUAL BRUISING OR BLEEDING *URINARY PROBLEMS (pain or burning when urinating, or frequent urination) *BOWEL PROBLEMS (unusual diarrhea, constipation, pain near the anus) TENDERNESS IN MOUTH AND THROAT WITH OR WITHOUT PRESENCE OF ULCERS (sore throat, sores in mouth, or a toothache) UNUSUAL RASH, SWELLING OR PAIN  UNUSUAL VAGINAL DISCHARGE OR ITCHING   Items with * indicate a potential emergency and should be followed up as soon as possible or go to the Emergency Department if any problems should occur.  Please show the CHEMOTHERAPY ALERT CARD or IMMUNOTHERAPY ALERT CARD  at check-in to the Emergency Department and triage nurse.  Should you have questions after your visit or need to cancel or reschedule your appointment, please contact [facility]  Dept: [phone]  and follow the prompts.  Office hours are 8:00 a.m. to 4:30 p.m. Monday - Friday. Please note that voicemails left after 4:00 p.m. may not be returned until the following business day.  We are closed weekends and major holidays. You have access to a nurse at all times for urgent questions. Please call the main number to the clinic Dept: [phone] and follow the prompts.   For any non-urgent questions, you may also contact your provider using MyChart. We now offer e-Visits for anyone 71 and older to request care online for non-urgent symptoms. For details visit [email or web address].   Also download the MyChart app! Go to the app store, search "MyChart", open the app, select [facility], and log in with your MyChart username and password.  Masks are optional in the cancer centers. If you would like for your care team to wear a mask while they are taking care of you, please let them know. You may have one support person who is at least 85 years old accompany you for your appointments.  Pembrolizumab Injection What is this medication? PEMBROLIZUMAB (PEM broe LIZ ue mab) treats some types of cancer. It works by helping your immune system slow or stop the spread of cancer cells. It is a monoclonal antibody. This medicine may be used for other purposes; ask your health care provider or pharmacist if you have questions. COMMON BRAND NAME(S):  Keytruda What should I tell my care team before I take this medication? They need to know if you have any of these conditions: Allogeneic stem cell transplant (uses someone else's stem cells) Autoimmune diseases, such as Crohn disease, ulcerative colitis, lupus History of chest radiation Nervous system problems, such as Guillain-Barre  syndrome, myasthenia gravis Organ transplant An unusual or allergic reaction to pembrolizumab, other medications, foods, dyes, or preservatives Pregnant or trying to get pregnant Breast-feeding How should I use this medication? This medication is injected into a vein. It is given by your care team in a hospital or clinic setting. A special MedGuide will be given to you before each treatment. Be sure to read this information carefully each time. Talk to your care team about the use of this medication in children. While it may be prescribed for children as young as 6 months for selected conditions, precautions do apply. Overdosage: If you think you have taken too much of this medicine contact a poison control center or emergency room at once. NOTE: This medicine is only for you. Do not share this medicine with others. What if I miss a dose? Keep appointments for follow-up doses. It is important not to miss your dose. Call your care team if you are unable to keep an appointment. What may interact with this medication? Interactions have not been studied. This list may not describe all possible interactions. Give your health care provider a list of all the medicines, herbs, non-prescription drugs, or dietary supplements you use. Also tell them if you smoke, drink alcohol, or use illegal drugs. Some items may interact with your medicine. What should I watch for while using this medication? Your condition will be monitored carefully while you are receiving this medication. You may need blood work while taking this medication. This medication may cause serious skin reactions. They can happen weeks to months after starting the medication. Contact your care team right away if you notice fevers or flu-like symptoms with a rash. The rash may be red or purple and then turn into blisters or peeling of the skin. You may also notice a red rash with swelling of the face, lips, or lymph nodes in your neck or under  your arms. Tell your care team right away if you have any change in your eyesight. Talk to your care team if you may be pregnant. Serious birth defects can occur if you take this medication during pregnancy and for 4 months after the last dose. You will need a negative pregnancy test before starting this medication. Contraception is recommended while taking this medication and for 4 months after the last dose. Your care team can help you find the option that works for you. Do not breastfeed while taking this medication and for 4 months after the last dose. What side effects may I notice from receiving this medication? Side effects that you should report to your care team as soon as possible: Allergic reactions--skin rash, itching, hives, swelling of the face, lips, tongue, or throat Dry cough, shortness of breath or trouble breathing Eye pain, redness, irritation, or discharge with blurry or decreased vision Heart muscle inflammation--unusual weakness or fatigue, shortness of breath, chest pain, fast or irregular heartbeat, dizziness, swelling of the ankles, feet, or hands Hormone gland problems--headache, sensitivity to light, unusual weakness or fatigue, dizziness, fast or irregular heartbeat, increased sensitivity to cold or heat, excessive sweating, constipation, hair loss, increased thirst or amount of urine, tremors or shaking, irritability Infusion reactions--chest pain, shortness  of breath or trouble breathing, feeling faint or lightheaded Kidney injury (glomerulonephritis)--decrease in the amount of urine, red or dark brown urine, foamy or bubbly urine, swelling of the ankles, hands, or feet Liver injury--right upper belly pain, loss of appetite, nausea, light-colored stool, dark yellow or brown urine, yellowing skin or eyes, unusual weakness or fatigue Pain, tingling, or numbness in the hands or feet, muscle weakness, change in vision, confusion or trouble speaking, loss of balance or  coordination, trouble walking, seizures Rash, fever, and swollen lymph nodes Redness, blistering, peeling, or loosening of the skin, including inside the mouth Sudden or severe stomach pain, bloody diarrhea, fever, nausea, vomiting Side effects that usually do not require medical attention (report to your care team if they continue or are bothersome): Bone, joint, or muscle pain Diarrhea Fatigue Loss of appetite Nausea Skin rash This list may not describe all possible side effects. Call your doctor for medical advice about side effects. You may report side effects to FDA at 1-800-FDA-1088. Where should I keep my medication? This medication is given in a hospital or clinic. It will not be stored at home. NOTE: This sheet is a summary. It may not cover all possible information. If you have questions about this medicine, talk to your doctor, pharmacist, or health care provider.  2023 Elsevier/Gold Standard (2013-04-07 00:00:00)

## 2022-07-20 NOTE — Progress Notes (Unsigned)
Patient seen by Dr. Sherrill today ? ?Vitals are within treatment parameters. ? ?Labs reviewed by Dr. Sherrill and are within treatment parameters. ? ?Per physician team, patient is ready for treatment and there are NO modifications to the treatment plan.  ?

## 2022-07-21 ENCOUNTER — Other Ambulatory Visit: Payer: Self-pay

## 2022-07-23 ENCOUNTER — Other Ambulatory Visit: Payer: Self-pay

## 2022-07-25 ENCOUNTER — Inpatient Hospital Stay: Payer: Medicare Other

## 2022-07-25 ENCOUNTER — Inpatient Hospital Stay: Payer: Medicare Other | Admitting: Oncology

## 2022-07-25 ENCOUNTER — Other Ambulatory Visit: Payer: Self-pay

## 2022-08-10 ENCOUNTER — Inpatient Hospital Stay: Payer: Medicare Other

## 2022-08-10 ENCOUNTER — Inpatient Hospital Stay: Payer: Medicare Other | Attending: Oncology

## 2022-08-10 ENCOUNTER — Encounter: Payer: Self-pay | Admitting: *Deleted

## 2022-08-10 ENCOUNTER — Inpatient Hospital Stay: Payer: Medicare Other | Admitting: Oncology

## 2022-08-10 VITALS — BP 120/62 | HR 50 | Resp 18

## 2022-08-10 VITALS — BP 122/70 | HR 56 | Temp 98.1°F | Resp 18 | Ht 65.0 in | Wt 185.0 lb

## 2022-08-10 DIAGNOSIS — C3492 Malignant neoplasm of unspecified part of left bronchus or lung: Secondary | ICD-10-CM

## 2022-08-10 DIAGNOSIS — Z5112 Encounter for antineoplastic immunotherapy: Secondary | ICD-10-CM | POA: Diagnosis not present

## 2022-08-10 DIAGNOSIS — C7951 Secondary malignant neoplasm of bone: Secondary | ICD-10-CM | POA: Diagnosis present

## 2022-08-10 DIAGNOSIS — C3432 Malignant neoplasm of lower lobe, left bronchus or lung: Secondary | ICD-10-CM | POA: Diagnosis present

## 2022-08-10 LAB — CBC WITH DIFFERENTIAL (CANCER CENTER ONLY)
Abs Immature Granulocytes: 0.02 10*3/uL (ref 0.00–0.07)
Basophils Absolute: 0 10*3/uL (ref 0.0–0.1)
Basophils Relative: 0 %
Eosinophils Absolute: 0 10*3/uL (ref 0.0–0.5)
Eosinophils Relative: 1 %
HCT: 35.8 % — ABNORMAL LOW (ref 36.0–46.0)
Hemoglobin: 11.7 g/dL — ABNORMAL LOW (ref 12.0–15.0)
Immature Granulocytes: 0 %
Lymphocytes Relative: 17 %
Lymphs Abs: 0.8 10*3/uL (ref 0.7–4.0)
MCH: 30.4 pg (ref 26.0–34.0)
MCHC: 32.7 g/dL (ref 30.0–36.0)
MCV: 93 fL (ref 80.0–100.0)
Monocytes Absolute: 0.5 10*3/uL (ref 0.1–1.0)
Monocytes Relative: 11 %
Neutro Abs: 3.3 10*3/uL (ref 1.7–7.7)
Neutrophils Relative %: 71 %
Platelet Count: 160 10*3/uL (ref 150–400)
RBC: 3.85 MIL/uL — ABNORMAL LOW (ref 3.87–5.11)
RDW: 13.2 % (ref 11.5–15.5)
WBC Count: 4.7 10*3/uL (ref 4.0–10.5)
nRBC: 0 % (ref 0.0–0.2)

## 2022-08-10 LAB — CMP (CANCER CENTER ONLY)
ALT: 17 U/L (ref 0–44)
AST: 18 U/L (ref 15–41)
Albumin: 4.3 g/dL (ref 3.5–5.0)
Alkaline Phosphatase: 50 U/L (ref 38–126)
Anion gap: 7 (ref 5–15)
BUN: 26 mg/dL — ABNORMAL HIGH (ref 8–23)
CO2: 28 mmol/L (ref 22–32)
Calcium: 10.2 mg/dL (ref 8.9–10.3)
Chloride: 102 mmol/L (ref 98–111)
Creatinine: 0.9 mg/dL (ref 0.44–1.00)
GFR, Estimated: >60 mL/min (ref >60)
Glucose, Bld: 107 mg/dL — ABNORMAL HIGH (ref 70–99)
Potassium: 4.1 mmol/L (ref 3.5–5.1)
Sodium: 137 mmol/L (ref 135–145)
Total Bilirubin: 0.4 mg/dL (ref 0.3–1.2)
Total Protein: 6.8 g/dL (ref 6.5–8.1)

## 2022-08-10 MED ORDER — HEPARIN SOD (PORK) LOCK FLUSH 100 UNIT/ML IV SOLN
500.0000 [IU] | Freq: Once | INTRAVENOUS | Status: AC | PRN
  Administered 2022-08-10: 500 [IU]

## 2022-08-10 MED ORDER — SODIUM CHLORIDE 0.9 % IV SOLN
200.0000 mg | Freq: Once | INTRAVENOUS | Status: AC
  Administered 2022-08-10: 200 mg via INTRAVENOUS
  Filled 2022-08-10: qty 8

## 2022-08-10 MED ORDER — SODIUM CHLORIDE 0.9% FLUSH
10.0000 mL | INTRAVENOUS | Status: DC | PRN
  Administered 2022-08-10: 10 mL

## 2022-08-10 MED ORDER — SODIUM CHLORIDE 0.9 % IV SOLN: Freq: Once | INTRAVENOUS | Status: AC

## 2022-08-10 NOTE — Patient Instructions (Addendum)
[place]   Discharge Instructions: Thank you for choosing [facility] to provide your oncology and hematology care.   If you have a lab appointment with the [facility], please go directly to the [facility] and check in at the registration area.   Wear comfortable clothing and clothing appropriate for easy access to any Portacath or PICC line.   We strive to give you quality time with your provider. You may need to reschedule your appointment if you arrive late (15 or more minutes).  Arriving late affects you and other patients whose appointments are after yours.  Also, if you miss three or more appointments without notifying the office, you may be dismissed from the clinic at the provider's discretion.      For prescription refill requests, have your pharmacy contact our office and allow 72 hours for refills to be completed.    Today you received the following chemotherapy and/or immunotherapy agents Pembrolizumab (KEYTRUDA).      To help prevent nausea and vomiting after your treatment, we encourage you to take your nausea medication as directed.  BELOW ARE SYMPTOMS THAT SHOULD BE REPORTED IMMEDIATELY: *FEVER GREATER THAN 100.4 F (38 C) OR HIGHER *CHILLS OR SWEATING *NAUSEA AND VOMITING THAT IS NOT CONTROLLED WITH YOUR NAUSEA MEDICATION *UNUSUAL SHORTNESS OF BREATH *UNUSUAL BRUISING OR BLEEDING *URINARY PROBLEMS (pain or burning when urinating, or frequent urination) *BOWEL PROBLEMS (unusual diarrhea, constipation, pain near the anus) TENDERNESS IN MOUTH AND THROAT WITH OR WITHOUT PRESENCE OF ULCERS (sore throat, sores in mouth, or a toothache) UNUSUAL RASH, SWELLING OR PAIN  UNUSUAL VAGINAL DISCHARGE OR ITCHING   Items with * indicate a potential emergency and should be followed up as soon as possible or go to the Emergency Department if any problems should occur.  Please show the CHEMOTHERAPY ALERT CARD or IMMUNOTHERAPY ALERT CARD  at check-in to the Emergency Department and triage nurse.  Should you have questions after your visit or need to cancel or reschedule your appointment, please contact [facility]  Dept: [phone]  and follow the prompts.  Office hours are 8:00 a.m. to 4:30 p.m. Monday - Friday. Please note that voicemails left after 4:00 p.m. may not be returned until the following business day.  We are closed weekends and major holidays. You have access to a nurse at all times for urgent questions. Please call the main number to the clinic Dept: [phone] and follow the prompts.   For any non-urgent questions, you may also contact your provider using MyChart. We now offer e-Visits for anyone 39 and older to request care online for non-urgent symptoms. For details visit [email or web address].   Also download the MyChart app! Go to the app store, search "MyChart", open the app, select [facility], and log in with your MyChart username and password.  Pembrolizumab Injection What is this medication? PEMBROLIZUMAB (PEM broe LIZ ue mab) treats some types of cancer. It works by helping your immune system slow or stop the spread of cancer cells. It is a monoclonal antibody. This medicine may be used for other purposes; ask your health care provider or pharmacist if you have questions. COMMON BRAND NAME(S): Keytruda What should I tell my care team before I take this medication? They need to know if you have any of these conditions: Allogeneic stem cell transplant (uses someone else's stem cells) Autoimmune diseases, such as Crohn disease, ulcerative colitis, lupus History of chest radiation Nervous system problems,  such as Guillain-Barre syndrome, myasthenia gravis Organ transplant An unusual or allergic reaction to pembrolizumab, other medications, foods, dyes, or preservatives Pregnant or trying to get pregnant Breast-feeding How should I use this medication? This medication is  injected into a vein. It is given by your care team in a hospital or clinic setting. A special MedGuide will be given to you before each treatment. Be sure to read this information carefully each time. Talk to your care team about the use of this medication in children. While it may be prescribed for children as young as 6 months for selected conditions, precautions do apply. Overdosage: If you think you have taken too much of this medicine contact a poison control center or emergency room at once. NOTE: This medicine is only for you. Do not share this medicine with others. What if I miss a dose? Keep appointments for follow-up doses. It is important not to miss your dose. Call your care team if you are unable to keep an appointment. What may interact with this medication? Interactions have not been studied. This list may not describe all possible interactions. Give your health care provider a list of all the medicines, herbs, non-prescription drugs, or dietary supplements you use. Also tell them if you smoke, drink alcohol, or use illegal drugs. Some items may interact with your medicine. What should I watch for while using this medication? Your condition will be monitored carefully while you are receiving this medication. You may need blood work while taking this medication. This medication may cause serious skin reactions. They can happen weeks to months after starting the medication. Contact your care team right away if you notice fevers or flu-like symptoms with a rash. The rash may be red or purple and then turn into blisters or peeling of the skin. You may also notice a red rash with swelling of the face, lips, or lymph nodes in your neck or under your arms. Tell your care team right away if you have any change in your eyesight. Talk to your care team if you may be pregnant. Serious birth defects can occur if you take this medication during pregnancy and for 4 months after the last dose. You  will need a negative pregnancy test before starting this medication. Contraception is recommended while taking this medication and for 4 months after the last dose. Your care team can help you find the option that works for you. Do not breastfeed while taking this medication and for 4 months after the last dose. What side effects may I notice from receiving this medication? Side effects that you should report to your care team as soon as possible: Allergic reactions--skin rash, itching, hives, swelling of the face, lips, tongue, or throat Dry cough, shortness of breath or trouble breathing Eye pain, redness, irritation, or discharge with blurry or decreased vision Heart muscle inflammation--unusual weakness or fatigue, shortness of breath, chest pain, fast or irregular heartbeat, dizziness, swelling of the ankles, feet, or hands Hormone gland problems--headache, sensitivity to light, unusual weakness or fatigue, dizziness, fast or irregular heartbeat, increased sensitivity to cold or heat, excessive sweating, constipation, hair loss, increased thirst or amount of urine, tremors or shaking, irritability Infusion reactions--chest pain, shortness of breath or trouble breathing, feeling faint or lightheaded Kidney injury (glomerulonephritis)--decrease in the amount of urine, red or dark brown urine, foamy or bubbly urine, swelling of the ankles, hands, or feet Liver injury--right upper belly pain, loss of appetite, nausea, light-colored stool, dark yellow or brown urine,  yellowing skin or eyes, unusual weakness or fatigue Pain, tingling, or numbness in the hands or feet, muscle weakness, change in vision, confusion or trouble speaking, loss of balance or coordination, trouble walking, seizures Rash, fever, and swollen lymph nodes Redness, blistering, peeling, or loosening of the skin, including inside the mouth Sudden or severe stomach pain, bloody diarrhea, fever, nausea, vomiting Side effects that  usually do not require medical attention (report to your care team if they continue or are bothersome): Bone, joint, or muscle pain Diarrhea Fatigue Loss of appetite Nausea Skin rash This list may not describe all possible side effects. Call your doctor for medical advice about side effects. You may report side effects to FDA at 1-800-FDA-1088. Where should I keep my medication? This medication is given in a hospital or clinic. It will not be stored at home. NOTE: This sheet is a summary. It may not cover all possible information. If you have questions about this medicine, talk to your doctor, pharmacist, or health care provider.  2023 Elsevier/Gold Standard (2013-04-07 00:00:00)

## 2022-08-10 NOTE — Progress Notes (Signed)
Patient seen by Dr. Sherrill today ? ?Vitals are within treatment parameters. ? ?Labs reviewed by Dr. Sherrill and are within treatment parameters. ? ?Per physician team, patient is ready for treatment and there are NO modifications to the treatment plan.  ?

## 2022-08-10 NOTE — Progress Notes (Signed)
[name] OFFICE PROGRESS NOTE   Diagnosis: Non-small cell lung cancer  INTERVAL HISTORY:   [name] returns as scheduled.  She continues treatment with every 3-week pembrolizumab.  She has persistent pain at the left hip/groin that is worse with weightbearing.  She saw Dr. Adaline Sill on 08/04/2022.  She is scheduled for a percutaneous fixation of the acetabulum later this month. She reports discomfort at the right posterior lateral chest wall for the past 2 weeks.  The pain is present with movement.  Objective:  Vital signs in last 24 hours:  Blood pressure 122/70, pulse (!) 56, temperature 98.1 F (36.7 C), temperature source Oral, resp. rate 18, height 5\' 5"  (1.651 m), weight 185 lb (83.9 kg), SpO2 93 %.    Resp: Lungs clear bilaterally Cardio: Regular rate and rhythm GI: No hepatosplenomegaly Vascular: No leg edema Musculoskeletal: Tender at the right lateral chest wall over the ribs.  No mass.  No rash.  Portacath/PICC-without erythema  Lab Results:  Lab Results  Component Value Date   WBC 4.7 08/10/2022   HGB 11.7 (L) 08/10/2022   HCT 35.8 (L) 08/10/2022   MCV 93.0 08/10/2022   PLT 160 08/10/2022   NEUTROABS 3.3 08/10/2022    CMP  Lab Results  Component Value Date   NA 137 07/20/2022   K 4.6 07/20/2022   CL 102 07/20/2022   CO2 26 07/20/2022   GLUCOSE 113 (H) 07/20/2022   BUN 33 (H) 07/20/2022   CREATININE 0.92 07/20/2022   CALCIUM 9.6 07/20/2022   PROT 6.3 (L) 07/20/2022   ALBUMIN 4.0 07/20/2022   AST 16 07/20/2022   ALT 16 07/20/2022   ALKPHOS 48 07/20/2022   BILITOT 0.3 07/20/2022   GFRNONAA >60 07/20/2022   GFRAA >60 04/21/2020    Medications: I have reviewed the patient's current medications.   Assessment/Plan: Non-small cell lung cancer MRI lumbar spine 04/29/2019- enlarging marrow lesions involving the L1 vertebral body, upper left sacrum and right iliac bone MRI pelvis 04/29/2019- 3.5 cm left iliac bone lesion appears  slightly larger; other similar appearing lesions present within the left superior pubic ramus, left superior abdomen acetabulum and upper left sacrum Kappa free light chains with mild elevation 05/12/2019  CTs 05/12/2019- left lower lobe pulmonary mass 3.3 x 3.2 cm; lytic process left iliac bone; spinal lesions; 1.1 cm low-density left kidney lesion; right thyroid enlargement with heterogeneous appearance with potential for multiple discrete lesions Biopsy left lower lobe lung mass 05/26/2019-poorly differentiated carcinoma; positive for cytokeratin 5/6, p63 and TTF-1, no EGFR, BRAF, ALK, ERBB2,ROS, or NTRK alteration Cycle 1 carboplatin/Alimta/pembrolizumab 06/06/2019 Cycle 2 carboplatin/Alimta/pembrolizumab 06/27/2019 Cycle 3 carboplatin/Alimta/pembrolizumab 07/18/2019 Cycle 4 carboplatin/Alimta/pembrolizumab 08/07/2019 CTs 08/27/2019-significant decrease in size of lobulated mass left lower lobe.  Unchanged appearance of subtle bone lesions. Cycle 5 Alimta/pembrolizumab 08/28/2019 Cycle 6 Alimta/pembrolizumab 09/18/2019 Cycle 7 Alimta/pembrolizumab 10/09/2019 Cycle 8 Alimta/pembrolizumab 10/30/2019 Cycle 9 Alimta/pembrolizumab 11/20/2019 CTs 12/09/2019-no evidence of disease progression, left lower lobe nodule slightly decreased in size, stable L1, left sacral, and left pubic ramus metastases Cycle 10 Alimta/pembrolizumab 12/11/2019 Cycle 11 pembrolizumab alone 01/02/2020 (Alimta held due to edema, tenderness, erythema at the lower legs) Cycle 12 pembrolizumab 01/23/2020 Cycle 13 pembrolizumab 02/12/2020 Cycle 14 pembrolizumab 03/04/2020 CTs 03/18/2020-stable left lower lobe lesion, mild sclerosis at the superior endplate of L1 that was previously hypermetabolic, stable small left upper sacral lucent lesion, previous left superior pubic ramus lesion is occult on the CT, CT head negative for malignancy Cycle 15 pembrolizumab 03/25/2020 Cycle 16 pembrolizumab 04/21/2020  Cycle 17 pembrolizumab  05/17/2020 05/19/2020 bone scan-no definite abnormalities to suggest osseous metastases.  Areas of concern on prior PET-CT involving left iliac bone and L1 vertebral body showed no abnormalities on the current study Cycle 18 Pembrolizumab 06/10/2020 Cycle 19 Pembrolizumab 06/30/2020 CTs 07/16/2020-stable left lower lobe nodule, stable faint superior L1 vertebral lesion, no evidence of disease progression Cycle 20 pembrolizumab 07/21/2020 Cycle 21 Pembrolizumab 08/11/2020 Cycle 22 pembrolizumab 09/01/2020 Cycle 23 Pembrolizumab 09/22/2020 Cycle 24 pembrolizumab 10/13/2020 Cycle 25 Pembrolizumab 11/03/2020 Cycle 26 pembrolizumab 11/26/2020 CTs 12/15/2020- stable left lower lobe mass, stable sclerotic lesion at L2, no evidence of disease progression Cycle 27 pembrolizumab 12/17/2020 PET scan 01/03/2021-1.8 x 1.3 cm left lower lobe nodule similar in size to CT of 12/15/2020 and measures smaller than previous PET/CT from 2020.  Nodule is markedly hypermetabolic.  No evidence for hypermetabolic hilar or mediastinal lymphadenopathy.  Several tiny foci of hypermetabolism identified in bony anatomy raising concern for skeletal metastases.  Comparison of the PET to CT 07/16/2020-lobular left lower lobe pulmonary nodule measured 2.4 x 1.5 cm on the prior study, current study it measured 1.8 x 1.3 cm.  Lesion in the anterior left acetabulum is similar to the 07/16/2020 exam although overlying cortical thinning slightly more pronounced on the current study.  Described lesion in the scapula shows some cortical sclerosis and a tiny central marrow lucency not substantially changed compared to 07/16/2020.  Left third rib lesion shows heterogeneous mineralization similar to 07/16/2020. MRI of cervical spine 01/13/2021-increased left facet edema at C5-C6, severe facet arthrosis on the left at C7-T1 and on the right at C3-C4, no evidence of metastatic disease Cycle 28 Pembrolizumab 01/14/2021 Cycle 29 Pembrolizumab 02/03/2021 Cycle 30  Pembrolizumab 02/24/2021 MRI left hip 03/02/2021-compared to 04/29/2019, slight increase in size of metastases at the left iliac and left acetabulum.  Lesion at the left upper sacrum is less conspicuous and a lesion at the left superior pubic ramus is stable Cycle 31 pembrolizumab 03/17/2021 Radiation to left acetabulum and left iliac 03/30/2021-04/13/2021 Cycle 32 pembrolizumab 04/07/2021 Cycle 33 Pembrolizumab 04/28/2021 Cycle 34 pembrolizumab 05/18/2021 PET scan 06/01/2021-no significant change in size or degree of FDG uptake associated with FDG avid left lower lobe lung nodule compatible with neoplasm.  Stable to improved appearance of multifocal FDG avid bone metastasis. Cycle 35 pembrolizumab 06/10/2021 Cycle 36 pembrolizumab 07/01/2021 Cycle 37 pembrolizumab 07/29/2021 Cycle 38 pembrolizumab 08/19/2021 Cycle 39 Pembrolizumab 09/09/2021 Cycle 40 pembrolizumab 09/30/2021 CT chest 10/20/2021 to evaluate complaints of chest pain-no PE.  Interval increase in left lower lobe mass. Cycle 41 Pembrolizumab 10/21/2021 Cycle 42 pembrolizumab 11/11/2021 PET 11/24/2021-mild increase in size of left lower lobe mass, no evidence of solid organ or nodal metastases, stable multifocal bone metastases.  No new sites of metastatic disease. Cycle 43 pembrolizumab 11/29/2021 Cycle 45 Pembrolizumab 12/20/2021 Cycle 46 Pembrolizumab 01/09/2022 Cycle 47 pembrolizumab 02/01/2022 Cycle 48 Pembrolizumab 02/24/2022 Cycle 49 Pembrolizumab 03/17/2022 Cycle 50 pembrolizumab 04/07/2022 MRI l pelvis 04/12/2022-"new "lesion at the left superior acetabulum, stable bone lesions at the left suprapubic ramus, left S1, and left iliac Cycle 51 pembrolizumab 04/28/2022 Palliative radiation to the left hip and left scapula 04/26/2022-05/09/2022 Cycle 52 pembrolizumab 05/18/2022 PET 06/02/2022-stable left lower lobe tumor, slight increase in hypermetabolism associated with the left acetabular metastasis, additional previously seen bone lesions no longer  have hypermetabolism Cycle 53 pembrolizumab 06/08/2022 Cycle 54 pembrolizumab 06/29/2022 Cycle 55 pembrolizumab 07/20/2022 Cycle 56 pembrolizumab 08/10/2022 Pain secondary to metastatic lung cancer Chronic back pain Type 2 diabetes Essential hypertension CAD Hyperlipidemia Family history  significant for multiple members with breast cancer Grade 1 skin rash 07/18/2019 likely related to immunotherapy.  Topical steroid cream as needed. E. coli urinary tract infection 07/14/2019.  Completed cephalexin. Edema/tenderness at the right greater than left ankle 10/21/2019-etiology unclear, potentially related to systemic therapy or an infection, doxycycline prescribed-improved 10/23/2019; marked improvement 11/20/2019; at office visit 01/02/2020 she reports worsening of lower extremity edema, pain/tenderness, erythema 3 to 4 days following each treatment.  Alimta held 01/02/2020.  Referral to dermatology. COVID-19 infection 03/30/2020, monoclonal antibody therapy 04/06/2020 Arthritis, possibly related to Pembrolizumab, improved since beginning Medrol, now followed by Dr. Amil Amen Pain left femoral head/upper femur/groin-negative plain x-ray; CT pelvis 03/28/2022-no acute appearing bone abnormality at the left hip or involving the proximal left femur, no evidence of acute fracture or dislocation, soft tissues left hip and left groin unremarkable; osseous mets within the left sacrum, left iliac bone and anterior acetabulum without evidence of associated fracture or dislocation.      Disposition: [name] has metastatic non-small cell lung cancer.  Her overall clinical status is stable.  She will continue every 3-week pembrolizumab.  She has pain related to a metastasis/fracture at the left acetabulum.  She plans to undergo a palliative procedure and orthopedics at [facility].  The right chest wall discomfort could be related to metastatic lung cancer, but no metastatic disease in the right ribs has been seen on  recent imaging including a PET from November 2023.  She will call for increased right chest wall pain.  She will complete another treat with pembrolizumab today.  [name] will return for an office and lab visit in 3 weeks.  [name], MD  08/10/2022  10:12 AM

## 2022-08-11 ENCOUNTER — Telehealth: Payer: Self-pay | Admitting: *Deleted

## 2022-08-11 ENCOUNTER — Other Ambulatory Visit: Payer: Self-pay

## 2022-08-11 ENCOUNTER — Encounter: Payer: Self-pay | Admitting: Oncology

## 2022-08-11 NOTE — Telephone Encounter (Signed)
Patient called w/report of new sudden onset of right posterior-lateral rib cage pain that started on 1/11 when she was using her cane to ambulate. Only relief is rest, heating pad an 3/4 Hydrocodone ~ 5 hours. Per Dr. Truett Perna: Could be muscle strain, fracture or ?? Mets. Try rest and Hydrocodone. Call if pain not relieved or starts getting worse. Encouraged her to take the entire hydrocodone tablet.

## 2022-08-16 ENCOUNTER — Other Ambulatory Visit: Payer: Self-pay

## 2022-08-20 ENCOUNTER — Other Ambulatory Visit: Payer: Self-pay

## 2022-08-21 ENCOUNTER — Telehealth: Payer: Self-pay

## 2022-08-21 NOTE — Telephone Encounter (Signed)
Patient called in stating she was concerned about having her hip surgery on Wednesday 1/24/24as she was still having rib pain.  Patient states pain is 10/10 without pain meds and 5/10 with pain meds.  Patient confirmed that she is taking full pill of hydrocodone as recommended when she called the office last week.  Patient wanted to know if she needs to be seen and evaluated by our office before having her surgery.  Dr. Truett Perna informed of patient's concern.  Per Dr. Truett Perna, patient will need to reach out to surgeon's office with concerns about having the surgery.  If pain becomes worse, patient asked to reach out to our office for further evaluation.  Patient verbalized understanding and agreed to reach out to surgeon's office. All questions were answered during the phone call.

## 2022-08-31 ENCOUNTER — Inpatient Hospital Stay: Payer: Medicare Other | Admitting: Oncology

## 2022-08-31 ENCOUNTER — Inpatient Hospital Stay: Payer: Medicare Other | Attending: Oncology

## 2022-08-31 ENCOUNTER — Inpatient Hospital Stay: Payer: Medicare Other

## 2022-08-31 VITALS — BP 105/58 | HR 91 | Temp 97.9°F | Resp 18 | Ht 65.0 in | Wt 189.0 lb

## 2022-08-31 VITALS — BP 99/57 | HR 60 | Resp 20

## 2022-08-31 DIAGNOSIS — Z5112 Encounter for antineoplastic immunotherapy: Secondary | ICD-10-CM | POA: Insufficient documentation

## 2022-08-31 DIAGNOSIS — C3492 Malignant neoplasm of unspecified part of left bronchus or lung: Secondary | ICD-10-CM

## 2022-08-31 DIAGNOSIS — C3432 Malignant neoplasm of lower lobe, left bronchus or lung: Secondary | ICD-10-CM | POA: Diagnosis present

## 2022-08-31 DIAGNOSIS — Z79899 Other long term (current) drug therapy: Secondary | ICD-10-CM | POA: Insufficient documentation

## 2022-08-31 DIAGNOSIS — C7951 Secondary malignant neoplasm of bone: Secondary | ICD-10-CM | POA: Insufficient documentation

## 2022-08-31 LAB — CMP (CANCER CENTER ONLY)
ALT: 11 U/L (ref 0–44)
AST: 14 U/L — ABNORMAL LOW (ref 15–41)
Albumin: 4.1 g/dL (ref 3.5–5.0)
Alkaline Phosphatase: 60 U/L (ref 38–126)
Anion gap: 7 (ref 5–15)
BUN: 31 mg/dL — ABNORMAL HIGH (ref 8–23)
CO2: 28 mmol/L (ref 22–32)
Calcium: 10.2 mg/dL (ref 8.9–10.3)
Chloride: 102 mmol/L (ref 98–111)
Creatinine: 1.18 mg/dL — ABNORMAL HIGH (ref 0.44–1.00)
GFR, Estimated: 45 mL/min — ABNORMAL LOW (ref >60)
Glucose, Bld: 121 mg/dL — ABNORMAL HIGH (ref 70–99)
Potassium: 4.2 mmol/L (ref 3.5–5.1)
Sodium: 137 mmol/L (ref 135–145)
Total Bilirubin: 0.3 mg/dL (ref 0.3–1.2)
Total Protein: 6.8 g/dL (ref 6.5–8.1)

## 2022-08-31 LAB — CBC WITH DIFFERENTIAL (CANCER CENTER ONLY)
Abs Immature Granulocytes: 0.04 10*3/uL (ref 0.00–0.07)
Basophils Absolute: 0 10*3/uL (ref 0.0–0.1)
Basophils Relative: 0 %
Eosinophils Absolute: 0.1 10*3/uL (ref 0.0–0.5)
Eosinophils Relative: 1 %
HCT: 32.1 % — ABNORMAL LOW (ref 36.0–46.0)
Hemoglobin: 10.5 g/dL — ABNORMAL LOW (ref 12.0–15.0)
Immature Granulocytes: 1 %
Lymphocytes Relative: 13 %
Lymphs Abs: 0.7 10*3/uL (ref 0.7–4.0)
MCH: 30.7 pg (ref 26.0–34.0)
MCHC: 32.7 g/dL (ref 30.0–36.0)
MCV: 93.9 fL (ref 80.0–100.0)
Monocytes Absolute: 0.6 10*3/uL (ref 0.1–1.0)
Monocytes Relative: 11 %
Neutro Abs: 3.8 10*3/uL (ref 1.7–7.7)
Neutrophils Relative %: 74 %
Platelet Count: 223 10*3/uL (ref 150–400)
RBC: 3.42 MIL/uL — ABNORMAL LOW (ref 3.87–5.11)
RDW: 13.5 % (ref 11.5–15.5)
WBC Count: 5.1 10*3/uL (ref 4.0–10.5)
nRBC: 0 % (ref 0.0–0.2)

## 2022-08-31 MED ORDER — SODIUM CHLORIDE 0.9 % IV SOLN
200.0000 mg | Freq: Once | INTRAVENOUS | Status: AC
  Administered 2022-08-31: 200 mg via INTRAVENOUS
  Filled 2022-08-31: qty 8

## 2022-08-31 MED ORDER — HYDROCODONE-ACETAMINOPHEN 10-325 MG PO TABS: 1.0000 | ORAL_TABLET | ORAL | 0 refills | Status: DC | PRN

## 2022-08-31 MED ORDER — SODIUM CHLORIDE 0.9% FLUSH
10.0000 mL | INTRAVENOUS | Status: DC | PRN
  Administered 2022-08-31: 10 mL

## 2022-08-31 MED ORDER — SODIUM CHLORIDE 0.9 % IV SOLN: Freq: Once | INTRAVENOUS | Status: AC

## 2022-08-31 MED ORDER — HEPARIN SOD (PORK) LOCK FLUSH 100 UNIT/ML IV SOLN
500.0000 [IU] | Freq: Once | INTRAVENOUS | Status: AC | PRN
  Administered 2022-08-31: 500 [IU]

## 2022-08-31 NOTE — Progress Notes (Signed)
Patient seen by Dr. Sherrill today ? ?Vitals are within treatment parameters. ? ?Labs reviewed by Dr. Sherrill and are within treatment parameters. ? ?Per physician team, patient is ready for treatment and there are NO modifications to the treatment plan.  ?

## 2022-08-31 NOTE — Progress Notes (Signed)
[facility] OFFICE PROGRESS NOTE   Diagnosis: Non-small cell lung cancer  INTERVAL HISTORY:   [name] returns as scheduled.  She completed another treatment with pembrolizumab on 08/10/2022.  She underwent percutaneous fixation of the pathologic left acetabular fracture on 08/23/2022.  She has soreness at the surgical site.  She has persistent pain in the groin and left trochanter areas.  She takes one half of a hydrocodone tablet 4 times per day.  She noted increased leg swelling following surgery.  This has improved today.  She has been taking HCTZ daily.  Objective:  Vital signs in last 24 hours:  Blood pressure (!) 105/58, pulse 91, temperature 97.9 F (36.6 C), temperature source Oral, resp. rate 18, height 5\' 5"  (1.651 m), weight 189 lb (85.7 kg), SpO2 96 %.    HEENT: No buccal thrush or ulcers.  Mild white coat over the tongu Resp: Lungs clear bilaterally Cardio: Regular rate and rhythm GI: No hepatosplenomegaly Vascular: Trace pitting edema at the low pretibial area and ankle bilaterally Musculoskeletal: Resolving ecchymoses at the pubis and left groin, staples in place at pubic and left groin incision sites  Portacath/PICC-without erythema  Lab Results:  Lab Results  Component Value Date   WBC 5.1 08/31/2022   HGB 10.5 (L) 08/31/2022   HCT 32.1 (L) 08/31/2022   MCV 93.9 08/31/2022   PLT 223 08/31/2022   NEUTROABS 3.8 08/31/2022    CMP  Lab Results  Component Value Date   NA 137 08/10/2022   K 4.1 08/10/2022   CL 102 08/10/2022   CO2 28 08/10/2022   GLUCOSE 107 (H) 08/10/2022   BUN 26 (H) 08/10/2022   CREATININE 0.90 08/10/2022   CALCIUM 10.2 08/10/2022   PROT 6.8 08/10/2022   ALBUMIN 4.3 08/10/2022   AST 18 08/10/2022   ALT 17 08/10/2022   ALKPHOS 50 08/10/2022   BILITOT 0.4 08/10/2022   GFRNONAA >60 08/10/2022   GFRAA >60 04/21/2020    No results found for: "CEA1", "CEA", "XBJ478", "CA125"  Lab Results  Component Value Date    INR 1.0 07/12/2021   LABPROT 12.7 07/12/2021     Medications: I have reviewed the patient's current medications.   Assessment/Plan:  Non-small cell lung cancer MRI lumbar spine 04/29/2019- enlarging marrow lesions involving the L1 vertebral body, upper left sacrum and right iliac bone MRI pelvis 04/29/2019- 3.5 cm left iliac bone lesion appears slightly larger; other similar appearing lesions present within the left superior pubic ramus, left superior abdomen acetabulum and upper left sacrum Kappa free light chains with mild elevation 05/12/2019  CTs 05/12/2019- left lower lobe pulmonary mass 3.3 x 3.2 cm; lytic process left iliac bone; spinal lesions; 1.1 cm low-density left kidney lesion; right thyroid enlargement with heterogeneous appearance with potential for multiple discrete lesions Biopsy left lower lobe lung mass 05/26/2019-poorly differentiated carcinoma; positive for cytokeratin 5/6, p63 and TTF-1, no EGFR, BRAF, ALK, ERBB2,ROS, or NTRK alteration Cycle 1 carboplatin/Alimta/pembrolizumab 06/06/2019 Cycle 2 carboplatin/Alimta/pembrolizumab 06/27/2019 Cycle 3 carboplatin/Alimta/pembrolizumab 07/18/2019 Cycle 4 carboplatin/Alimta/pembrolizumab 08/07/2019 CTs 08/27/2019-significant decrease in size of lobulated mass left lower lobe.  Unchanged appearance of subtle bone lesions. Cycle 5 Alimta/pembrolizumab 08/28/2019 Cycle 6 Alimta/pembrolizumab 09/18/2019 Cycle 7 Alimta/pembrolizumab 10/09/2019 Cycle 8 Alimta/pembrolizumab 10/30/2019 Cycle 9 Alimta/pembrolizumab 11/20/2019 CTs 12/09/2019-no evidence of disease progression, left lower lobe nodule slightly decreased in size, stable L1, left sacral, and left pubic ramus metastases Cycle 10 Alimta/pembrolizumab 12/11/2019 Cycle 11 pembrolizumab alone 01/02/2020 (Alimta held due to edema, tenderness, erythema at the lower  legs) Cycle 12 pembrolizumab 01/23/2020 Cycle 13 pembrolizumab 02/12/2020 Cycle 14 pembrolizumab 03/04/2020 CTs 03/18/2020-stable  left lower lobe lesion, mild sclerosis at the superior endplate of L1 that was previously hypermetabolic, stable small left upper sacral lucent lesion, previous left superior pubic ramus lesion is occult on the CT, CT head negative for malignancy Cycle 15 pembrolizumab 03/25/2020 Cycle 16 pembrolizumab 04/21/2020 Cycle 17 pembrolizumab 05/17/2020 05/19/2020 bone scan-no definite abnormalities to suggest osseous metastases.  Areas of concern on prior PET-CT involving left iliac bone and L1 vertebral body showed no abnormalities on the current study Cycle 18 Pembrolizumab 06/10/2020 Cycle 19 Pembrolizumab 06/30/2020 CTs 07/16/2020-stable left lower lobe nodule, stable faint superior L1 vertebral lesion, no evidence of disease progression Cycle 20 pembrolizumab 07/21/2020 Cycle 21 Pembrolizumab 08/11/2020 Cycle 22 pembrolizumab 09/01/2020 Cycle 23 Pembrolizumab 09/22/2020 Cycle 24 pembrolizumab 10/13/2020 Cycle 25 Pembrolizumab 11/03/2020 Cycle 26 pembrolizumab 11/26/2020 CTs 12/15/2020- stable left lower lobe mass, stable sclerotic lesion at L2, no evidence of disease progression Cycle 27 pembrolizumab 12/17/2020 PET scan 01/03/2021-1.8 x 1.3 cm left lower lobe nodule similar in size to CT of 12/15/2020 and measures smaller than previous PET/CT from 2020.  Nodule is markedly hypermetabolic.  No evidence for hypermetabolic hilar or mediastinal lymphadenopathy.  Several tiny foci of hypermetabolism identified in bony anatomy raising concern for skeletal metastases.  Comparison of the PET to CT 07/16/2020-lobular left lower lobe pulmonary nodule measured 2.4 x 1.5 cm on the prior study, current study it measured 1.8 x 1.3 cm.  Lesion in the anterior left acetabulum is similar to the 07/16/2020 exam although overlying cortical thinning slightly more pronounced on the current study.  Described lesion in the scapula shows some cortical sclerosis and a tiny central marrow lucency not substantially changed compared to  07/16/2020.  Left third rib lesion shows heterogeneous mineralization similar to 07/16/2020. MRI of cervical spine 01/13/2021-increased left facet edema at C5-C6, severe facet arthrosis on the left at C7-T1 and on the right at C3-C4, no evidence of metastatic disease Cycle 28 Pembrolizumab 01/14/2021 Cycle 29 Pembrolizumab 02/03/2021 Cycle 30 Pembrolizumab 02/24/2021 MRI left hip 03/02/2021-compared to 04/29/2019, slight increase in size of metastases at the left iliac and left acetabulum.  Lesion at the left upper sacrum is less conspicuous and a lesion at the left superior pubic ramus is stable Cycle 31 pembrolizumab 03/17/2021 Radiation to left acetabulum and left iliac 03/30/2021-04/13/2021 Cycle 32 pembrolizumab 04/07/2021 Cycle 33 Pembrolizumab 04/28/2021 Cycle 34 pembrolizumab 05/18/2021 PET scan 06/01/2021-no significant change in size or degree of FDG uptake associated with FDG avid left lower lobe lung nodule compatible with neoplasm.  Stable to improved appearance of multifocal FDG avid bone metastasis. Cycle 35 pembrolizumab 06/10/2021 Cycle 36 pembrolizumab 07/01/2021 Cycle 37 pembrolizumab 07/29/2021 Cycle 38 pembrolizumab 08/19/2021 Cycle 39 Pembrolizumab 09/09/2021 Cycle 40 pembrolizumab 09/30/2021 CT chest 10/20/2021 to evaluate complaints of chest pain-no PE.  Interval increase in left lower lobe mass. Cycle 41 Pembrolizumab 10/21/2021 Cycle 42 pembrolizumab 11/11/2021 PET 11/24/2021-mild increase in size of left lower lobe mass, no evidence of solid organ or nodal metastases, stable multifocal bone metastases.  No new sites of metastatic disease. Cycle 43 pembrolizumab 11/29/2021 Cycle 45 Pembrolizumab 12/20/2021 Cycle 46 Pembrolizumab 01/09/2022 Cycle 47 pembrolizumab 02/01/2022 Cycle 48 Pembrolizumab 02/24/2022 Cycle 49 Pembrolizumab 03/17/2022 Cycle 50 pembrolizumab 04/07/2022 MRI l pelvis 04/12/2022-"new "lesion at the left superior acetabulum, stable bone lesions at the left suprapubic ramus, left  S1, and left iliac Cycle 51 pembrolizumab 04/28/2022 Palliative radiation to the left hip and left scapula 04/26/2022-05/09/2022 Cycle  35 pembrolizumab 05/18/2022 PET 06/02/2022-stable left lower lobe tumor, slight increase in hypermetabolism associated with the left acetabular metastasis, additional previously seen bone lesions no longer have hypermetabolism Cycle 53 pembrolizumab 06/08/2022 Cycle 54 pembrolizumab 06/29/2022 Cycle 55 pembrolizumab 07/20/2022 Cycle 56 pembrolizumab 08/10/2022 Cycle 57 pembrolizumab 08/31/2022 Pain secondary to metastatic lung cancer Chronic back pain Type 2 diabetes Essential hypertension CAD Hyperlipidemia Family history significant for multiple members with breast cancer Grade 1 skin rash 07/18/2019 likely related to immunotherapy.  Topical steroid cream as needed. E. coli urinary tract infection 07/14/2019.  Completed cephalexin. Edema/tenderness at the right greater than left ankle 10/21/2019-etiology unclear, potentially related to systemic therapy or an infection, doxycycline prescribed-improved 10/23/2019; marked improvement 11/20/2019; at office visit 01/02/2020 she reports worsening of lower extremity edema, pain/tenderness, erythema 3 to 4 days following each treatment.  Alimta held 01/02/2020.  Referral to dermatology. COVID-19 infection 03/30/2020, monoclonal antibody therapy 04/06/2020 Arthritis, possibly related to Pembrolizumab, improved since beginning Medrol, now followed by Dr. Amil Amen Pain left femoral head/upper femur/groin-negative plain x-ray; CT pelvis 03/28/2022-no acute appearing bone abnormality at the left hip or involving the proximal left femur, no evidence of acute fracture or dislocation, soft tissues left hip and left groin unremarkable; osseous mets within the left sacrum, left iliac bone and anterior acetabulum without evidence of associated fracture or dislocation. Percutaneous fixation of left pathologic acetabular fracture 08/23/2022        Disposition: [name] underwent percutaneous fixation of the left acetabular fracture on 08/23/2022.  She continues to have pain in the left groin and trochanter areas.  She will continue hydrocodone as needed for pain.  Hopefully the pain will improve over the next few weeks.  There is no clinical evidence for progression of the metastatic non-small cell lung cancer.  She will complete another treatment with pembrolizumab today.  The BUN and creatinine are slightly elevated today.  I recommended she hold the HCTZ.  She is scheduled for surgical follow-up at [facility] on 09/13/2022.  I refilled her prescription for oxycodone.  [name], MD  08/31/2022  10:44 AM

## 2022-08-31 NOTE — Patient Instructions (Signed)
[name]   Discharge Instructions: Thank you for choosing [facility] to provide your oncology and hematology care.   If you have a lab appointment with the [facility], please go directly to the [facility] and check in at the registration area.   Wear comfortable clothing and clothing appropriate for easy access to any Portacath or PICC line.   We strive to give you quality time with your provider. You may need to reschedule your appointment if you arrive late (15 or more minutes).  Arriving late affects you and other patients whose appointments are after yours.  Also, if you miss three or more appointments without notifying the office, you may be dismissed from the clinic at the provider's discretion.      For prescription refill requests, have your pharmacy contact our office and allow 72 hours for refills to be completed.    Today you received the following chemotherapy and/or immunotherapy agents Pembrolizumab (KEYTRUDA).      To help prevent nausea and vomiting after your treatment, we encourage you to take your nausea medication as directed.  BELOW ARE SYMPTOMS THAT SHOULD BE REPORTED IMMEDIATELY: *FEVER GREATER THAN 100.4 F (38 C) OR HIGHER *CHILLS OR SWEATING *NAUSEA AND VOMITING THAT IS NOT CONTROLLED WITH YOUR NAUSEA MEDICATION *UNUSUAL SHORTNESS OF BREATH *UNUSUAL BRUISING OR BLEEDING *URINARY PROBLEMS (pain or burning when urinating, or frequent urination) *BOWEL PROBLEMS (unusual diarrhea, constipation, pain near the anus) TENDERNESS IN MOUTH AND THROAT WITH OR WITHOUT PRESENCE OF ULCERS (sore throat, sores in mouth, or a toothache) UNUSUAL RASH, SWELLING OR PAIN  UNUSUAL VAGINAL DISCHARGE OR ITCHING   Items with * indicate a potential emergency and should be followed up as soon as possible or go to the Emergency Department if any problems should occur.  Please show the CHEMOTHERAPY ALERT CARD or IMMUNOTHERAPY  ALERT CARD at check-in to the Emergency Department and triage nurse.  Should you have questions after your visit or need to cancel or reschedule your appointment, please contact [facility]  Dept: [phone]  and follow the prompts.  Office hours are 8:00 a.m. to 4:30 p.m. Monday - Friday. Please note that voicemails left after 4:00 p.m. may not be returned until the following business day.  We are closed weekends and major holidays. You have access to a nurse at all times for urgent questions. Please call the main number to the clinic Dept: [phone] and follow the prompts.   For any non-urgent questions, you may also contact your provider using MyChart. We now offer e-Visits for anyone 73 and older to request care online for non-urgent symptoms. For details visit [email or web address].   Also download the MyChart app! Go to the app store, search "MyChart", open the app, select [facility], and log in with your MyChart username and password.  Pembrolizumab Injection What is this medication? PEMBROLIZUMAB (PEM broe LIZ ue mab) treats some types of cancer. It works by helping your immune system slow or stop the spread of cancer cells. It is a monoclonal antibody. This medicine may be used for other purposes; ask your health care provider or pharmacist if you have questions. COMMON BRAND NAME(S): Keytruda What should I tell my care team before I take this medication? They need to know if you have any of these conditions: Allogeneic stem cell transplant (uses someone else's stem cells) Autoimmune diseases, such as Crohn disease, ulcerative colitis, lupus History of chest radiation Nervous  system problems, such as Guillain-Barre syndrome, myasthenia gravis Organ transplant An unusual or allergic reaction to pembrolizumab, other medications, foods, dyes, or preservatives Pregnant or trying to get pregnant Breast-feeding How should I use this  medication? This medication is injected into a vein. It is given by your care team in a hospital or clinic setting. A special MedGuide will be given to you before each treatment. Be sure to read this information carefully each time. Talk to your care team about the use of this medication in children. While it may be prescribed for children as young as 6 months for selected conditions, precautions do apply. Overdosage: If you think you have taken too much of this medicine contact a poison control center or emergency room at once. NOTE: This medicine is only for you. Do not share this medicine with others. What if I miss a dose? Keep appointments for follow-up doses. It is important not to miss your dose. Call your care team if you are unable to keep an appointment. What may interact with this medication? Interactions have not been studied. This list may not describe all possible interactions. Give your health care provider a list of all the medicines, herbs, non-prescription drugs, or dietary supplements you use. Also tell them if you smoke, drink alcohol, or use illegal drugs. Some items may interact with your medicine. What should I watch for while using this medication? Your condition will be monitored carefully while you are receiving this medication. You may need blood work while taking this medication. This medication may cause serious skin reactions. They can happen weeks to months after starting the medication. Contact your care team right away if you notice fevers or flu-like symptoms with a rash. The rash may be red or purple and then turn into blisters or peeling of the skin. You may also notice a red rash with swelling of the face, lips, or lymph nodes in your neck or under your arms. Tell your care team right away if you have any change in your eyesight. Talk to your care team if you may be pregnant. Serious birth defects can occur if you take this medication during pregnancy and for 4  months after the last dose. You will need a negative pregnancy test before starting this medication. Contraception is recommended while taking this medication and for 4 months after the last dose. Your care team can help you find the option that works for you. Do not breastfeed while taking this medication and for 4 months after the last dose. What side effects may I notice from receiving this medication? Side effects that you should report to your care team as soon as possible: Allergic reactions--skin rash, itching, hives, swelling of the face, lips, tongue, or throat Dry cough, shortness of breath or trouble breathing Eye pain, redness, irritation, or discharge with blurry or decreased vision Heart muscle inflammation--unusual weakness or fatigue, shortness of breath, chest pain, fast or irregular heartbeat, dizziness, swelling of the ankles, feet, or hands Hormone gland problems--headache, sensitivity to light, unusual weakness or fatigue, dizziness, fast or irregular heartbeat, increased sensitivity to cold or heat, excessive sweating, constipation, hair loss, increased thirst or amount of urine, tremors or shaking, irritability Infusion reactions--chest pain, shortness of breath or trouble breathing, feeling faint or lightheaded Kidney injury (glomerulonephritis)--decrease in the amount of urine, red or dark brown urine, foamy or bubbly urine, swelling of the ankles, hands, or feet Liver injury--right upper belly pain, loss of appetite, nausea, light-colored stool, dark yellow or  brown urine, yellowing skin or eyes, unusual weakness or fatigue Pain, tingling, or numbness in the hands or feet, muscle weakness, change in vision, confusion or trouble speaking, loss of balance or coordination, trouble walking, seizures Rash, fever, and swollen lymph nodes Redness, blistering, peeling, or loosening of the skin, including inside the mouth Sudden or severe stomach pain, bloody diarrhea, fever, nausea,  vomiting Side effects that usually do not require medical attention (report to your care team if they continue or are bothersome): Bone, joint, or muscle pain Diarrhea Fatigue Loss of appetite Nausea Skin rash This list may not describe all possible side effects. Call your doctor for medical advice about side effects. You may report side effects to FDA at 1-800-FDA-1088. Where should I keep my medication? This medication is given in a hospital or clinic. It will not be stored at home. NOTE: This sheet is a summary. It may not cover all possible information. If you have questions about this medicine, talk to your doctor, pharmacist, or health care provider.  2023 Elsevier/Gold Standard (2013-04-07 00:00:00)

## 2022-09-01 ENCOUNTER — Other Ambulatory Visit: Payer: Self-pay

## 2022-09-05 ENCOUNTER — Other Ambulatory Visit: Payer: Self-pay

## 2022-09-21 ENCOUNTER — Inpatient Hospital Stay: Payer: Medicare Other | Admitting: Oncology

## 2022-09-21 ENCOUNTER — Inpatient Hospital Stay: Payer: Medicare Other

## 2022-09-21 VITALS — BP 112/60 | HR 63 | Resp 18

## 2022-09-21 VITALS — BP 132/65 | HR 66 | Temp 98.2°F | Resp 18 | Ht 65.0 in | Wt 185.0 lb

## 2022-09-21 DIAGNOSIS — C3492 Malignant neoplasm of unspecified part of left bronchus or lung: Secondary | ICD-10-CM | POA: Diagnosis not present

## 2022-09-21 DIAGNOSIS — Z5112 Encounter for antineoplastic immunotherapy: Secondary | ICD-10-CM | POA: Diagnosis not present

## 2022-09-21 LAB — CBC WITH DIFFERENTIAL (CANCER CENTER ONLY)
Abs Immature Granulocytes: 0.02 10*3/uL (ref 0.00–0.07)
Basophils Absolute: 0 10*3/uL (ref 0.0–0.1)
Basophils Relative: 0 %
Eosinophils Absolute: 0.1 10*3/uL (ref 0.0–0.5)
Eosinophils Relative: 1 %
HCT: 33.7 % — ABNORMAL LOW (ref 36.0–46.0)
Hemoglobin: 11 g/dL — ABNORMAL LOW (ref 12.0–15.0)
Immature Granulocytes: 0 %
Lymphocytes Relative: 11 %
Lymphs Abs: 0.7 10*3/uL (ref 0.7–4.0)
MCH: 30.3 pg (ref 26.0–34.0)
MCHC: 32.6 g/dL (ref 30.0–36.0)
MCV: 92.8 fL (ref 80.0–100.0)
Monocytes Absolute: 0.5 10*3/uL (ref 0.1–1.0)
Monocytes Relative: 9 %
Neutro Abs: 4.8 10*3/uL (ref 1.7–7.7)
Neutrophils Relative %: 79 %
Platelet Count: 173 10*3/uL (ref 150–400)
RBC: 3.63 MIL/uL — ABNORMAL LOW (ref 3.87–5.11)
RDW: 13.6 % (ref 11.5–15.5)
WBC Count: 6.2 10*3/uL (ref 4.0–10.5)
nRBC: 0 % (ref 0.0–0.2)

## 2022-09-21 LAB — CMP (CANCER CENTER ONLY)
ALT: 17 U/L (ref 0–44)
AST: 18 U/L (ref 15–41)
Albumin: 4.3 g/dL (ref 3.5–5.0)
Alkaline Phosphatase: 68 U/L (ref 38–126)
Anion gap: 7 (ref 5–15)
BUN: 34 mg/dL — ABNORMAL HIGH (ref 8–23)
CO2: 26 mmol/L (ref 22–32)
Calcium: 10.1 mg/dL (ref 8.9–10.3)
Chloride: 101 mmol/L (ref 98–111)
Creatinine: 1.12 mg/dL — ABNORMAL HIGH (ref 0.44–1.00)
GFR, Estimated: 48 mL/min — ABNORMAL LOW (ref >60)
Glucose, Bld: 115 mg/dL — ABNORMAL HIGH (ref 70–99)
Potassium: 4.2 mmol/L (ref 3.5–5.1)
Sodium: 134 mmol/L — ABNORMAL LOW (ref 135–145)
Total Bilirubin: 0.4 mg/dL (ref 0.3–1.2)
Total Protein: 6.9 g/dL (ref 6.5–8.1)

## 2022-09-21 MED ORDER — SODIUM CHLORIDE 0.9 % IV SOLN: Freq: Once | INTRAVENOUS | Status: AC

## 2022-09-21 MED ORDER — SODIUM CHLORIDE 0.9% FLUSH
10.0000 mL | INTRAVENOUS | Status: DC | PRN
  Administered 2022-09-21: 10 mL

## 2022-09-21 MED ORDER — HEPARIN SOD (PORK) LOCK FLUSH 100 UNIT/ML IV SOLN
500.0000 [IU] | Freq: Once | INTRAVENOUS | Status: AC | PRN
  Administered 2022-09-21: 500 [IU]

## 2022-09-21 MED ORDER — SODIUM CHLORIDE 0.9 % IV SOLN
200.0000 mg | Freq: Once | INTRAVENOUS | Status: AC
  Administered 2022-09-21: 200 mg via INTRAVENOUS
  Filled 2022-09-21: qty 8

## 2022-09-21 NOTE — Progress Notes (Signed)
Patient seen by Dr. Benay Spice today  Vitals are within treatment parameters.  Labs reviewed by Dr. Benay Spice CBC diff reviewed and within treatment parameters, CMP pending. Per MD Benay Spice, pt is ready for tx with CMP results in treatment parameters.   Per physician team, patient is ready for treatment and there are NO modifications to the treatment plan.

## 2022-09-21 NOTE — Patient Instructions (Signed)
[name]   Discharge Instructions: Thank you for choosing [facility] to provide your oncology and hematology care.   If you have a lab appointment with the [facility], please go directly to the [facility] and check in at the registration area.   Wear comfortable clothing and clothing appropriate for easy access to any Portacath or PICC line.   We strive to give you quality time with your provider. You may need to reschedule your appointment if you arrive late (15 or more minutes).  Arriving late affects you and other patients whose appointments are after yours.  Also, if you miss three or more appointments without notifying the office, you may be dismissed from the clinic at the provider's discretion.      For prescription refill requests, have your pharmacy contact our office and allow 72 hours for refills to be completed.    Today you received the following chemotherapy and/or immunotherapy agents Pembrolizumab (KEYTRUDA).      To help prevent nausea and vomiting after your treatment, we encourage you to take your nausea medication as directed.  BELOW ARE SYMPTOMS THAT SHOULD BE REPORTED IMMEDIATELY: *FEVER GREATER THAN 100.4 F (38 C) OR HIGHER *CHILLS OR SWEATING *NAUSEA AND VOMITING THAT IS NOT CONTROLLED WITH YOUR NAUSEA MEDICATION *UNUSUAL SHORTNESS OF BREATH *UNUSUAL BRUISING OR BLEEDING *URINARY PROBLEMS (pain or burning when urinating, or frequent urination) *BOWEL PROBLEMS (unusual diarrhea, constipation, pain near the anus) TENDERNESS IN MOUTH AND THROAT WITH OR WITHOUT PRESENCE OF ULCERS (sore throat, sores in mouth, or a toothache) UNUSUAL RASH, SWELLING OR PAIN  UNUSUAL VAGINAL DISCHARGE OR ITCHING   Items with * indicate a potential emergency and should be followed up as soon as possible or go to the Emergency Department if any problems should occur.  Please show the CHEMOTHERAPY ALERT CARD or IMMUNOTHERAPY  ALERT CARD at check-in to the Emergency Department and triage nurse.  Should you have questions after your visit or need to cancel or reschedule your appointment, please contact [facility]  Dept: [phone]  and follow the prompts.  Office hours are 8:00 a.m. to 4:30 p.m. Monday - Friday. Please note that voicemails left after 4:00 p.m. may not be returned until the following business day.  We are closed weekends and major holidays. You have access to a nurse at all times for urgent questions. Please call the main number to the clinic Dept: [phone] and follow the prompts.   For any non-urgent questions, you may also contact your provider using MyChart. We now offer e-Visits for anyone 9 and older to request care online for non-urgent symptoms. For details visit [email or web address].   Also download the MyChart app! Go to the app store, search "MyChart", open the app, select [facility], and log in with your MyChart username and password.  Pembrolizumab Injection What is this medication? PEMBROLIZUMAB (PEM broe LIZ ue mab) treats some types of cancer. It works by helping your immune system slow or stop the spread of cancer cells. It is a monoclonal antibody. This medicine may be used for other purposes; ask your health care provider or pharmacist if you have questions. COMMON BRAND NAME(S): Keytruda What should I tell my care team before I take this medication? They need to know if you have any of these conditions: Allogeneic stem cell transplant (uses someone else's stem cells) Autoimmune diseases, such as Crohn disease, ulcerative colitis, lupus History of chest radiation Nervous  system problems, such as Guillain-Barre syndrome, myasthenia gravis Organ transplant An unusual or allergic reaction to pembrolizumab, other medications, foods, dyes, or preservatives Pregnant or trying to get pregnant Breast-feeding How should I use this  medication? This medication is injected into a vein. It is given by your care team in a hospital or clinic setting. A special MedGuide will be given to you before each treatment. Be sure to read this information carefully each time. Talk to your care team about the use of this medication in children. While it may be prescribed for children as young as 6 months for selected conditions, precautions do apply. Overdosage: If you think you have taken too much of this medicine contact a poison control center or emergency room at once. NOTE: This medicine is only for you. Do not share this medicine with others. What if I miss a dose? Keep appointments for follow-up doses. It is important not to miss your dose. Call your care team if you are unable to keep an appointment. What may interact with this medication? Interactions have not been studied. This list may not describe all possible interactions. Give your health care provider a list of all the medicines, herbs, non-prescription drugs, or dietary supplements you use. Also tell them if you smoke, drink alcohol, or use illegal drugs. Some items may interact with your medicine. What should I watch for while using this medication? Your condition will be monitored carefully while you are receiving this medication. You may need blood work while taking this medication. This medication may cause serious skin reactions. They can happen weeks to months after starting the medication. Contact your care team right away if you notice fevers or flu-like symptoms with a rash. The rash may be red or purple and then turn into blisters or peeling of the skin. You may also notice a red rash with swelling of the face, lips, or lymph nodes in your neck or under your arms. Tell your care team right away if you have any change in your eyesight. Talk to your care team if you may be pregnant. Serious birth defects can occur if you take this medication during pregnancy and for 4  months after the last dose. You will need a negative pregnancy test before starting this medication. Contraception is recommended while taking this medication and for 4 months after the last dose. Your care team can help you find the option that works for you. Do not breastfeed while taking this medication and for 4 months after the last dose. What side effects may I notice from receiving this medication? Side effects that you should report to your care team as soon as possible: Allergic reactions--skin rash, itching, hives, swelling of the face, lips, tongue, or throat Dry cough, shortness of breath or trouble breathing Eye pain, redness, irritation, or discharge with blurry or decreased vision Heart muscle inflammation--unusual weakness or fatigue, shortness of breath, chest pain, fast or irregular heartbeat, dizziness, swelling of the ankles, feet, or hands Hormone gland problems--headache, sensitivity to light, unusual weakness or fatigue, dizziness, fast or irregular heartbeat, increased sensitivity to cold or heat, excessive sweating, constipation, hair loss, increased thirst or amount of urine, tremors or shaking, irritability Infusion reactions--chest pain, shortness of breath or trouble breathing, feeling faint or lightheaded Kidney injury (glomerulonephritis)--decrease in the amount of urine, red or dark brown urine, foamy or bubbly urine, swelling of the ankles, hands, or feet Liver injury--right upper belly pain, loss of appetite, nausea, light-colored stool, dark yellow or  brown urine, yellowing skin or eyes, unusual weakness or fatigue Pain, tingling, or numbness in the hands or feet, muscle weakness, change in vision, confusion or trouble speaking, loss of balance or coordination, trouble walking, seizures Rash, fever, and swollen lymph nodes Redness, blistering, peeling, or loosening of the skin, including inside the mouth Sudden or severe stomach pain, bloody diarrhea, fever, nausea,  vomiting Side effects that usually do not require medical attention (report to your care team if they continue or are bothersome): Bone, joint, or muscle pain Diarrhea Fatigue Loss of appetite Nausea Skin rash This list may not describe all possible side effects. Call your doctor for medical advice about side effects. You may report side effects to FDA at 1-800-FDA-1088. Where should I keep my medication? This medication is given in a hospital or clinic. It will not be stored at home. NOTE: This sheet is a summary. It may not cover all possible information. If you have questions about this medicine, talk to your doctor, pharmacist, or health care provider.  2023 Elsevier/Gold Standard (2013-04-07 00:00:00)

## 2022-09-21 NOTE — Progress Notes (Signed)
[facility] OFFICE PROGRESS NOTE   Diagnosis: Non-small cell lung cancer  INTERVAL HISTORY:   [name] completed another cycle of pembrolizumab on 08/31/2022.  No rash or diarrhea.  She reports marked improvement in the "hip "pain.  She is now taking hydrocodone infrequently.  She has discomfort at the left and right upper back.  This pain is intermittent.  She has resumed Medrol for rheumatoid arthritis.  Objective:  Vital signs in last 24 hours:  Blood pressure 132/65, pulse 66, temperature 98.2 F (36.8 C), temperature source Skin, resp. rate 18, height 5\' 5"  (1.651 m), weight 185 lb (83.9 kg), SpO2 96 %.    HEENT: No thrush or ulcers Resp: Lungs clear bilaterally Cardio: Regular rate and rhythm GI: No hepatosplenomegaly Vascular: No leg edema  Skin: Healed surgical incisions at the left groin and suprapubic area  Portacath/PICC-without erythema  Lab Results:  Lab Results  Component Value Date   WBC 6.2 09/21/2022   HGB 11.0 (L) 09/21/2022   HCT 33.7 (L) 09/21/2022   MCV 92.8 09/21/2022   PLT 173 09/21/2022   NEUTROABS 4.8 09/21/2022    CMP  Lab Results  Component Value Date   NA 137 08/31/2022   K 4.2 08/31/2022   CL 102 08/31/2022   CO2 28 08/31/2022   GLUCOSE 121 (H) 08/31/2022   BUN 31 (H) 08/31/2022   CREATININE 1.18 (H) 08/31/2022   CALCIUM 10.2 08/31/2022   PROT 6.8 08/31/2022   ALBUMIN 4.1 08/31/2022   AST 14 (L) 08/31/2022   ALT 11 08/31/2022   ALKPHOS 60 08/31/2022   BILITOT 0.3 08/31/2022   GFRNONAA 45 (L) 08/31/2022   GFRAA >60 04/21/2020    Medications: I have reviewed the patient's current medications.   Assessment/Plan: Non-small cell lung cancer MRI lumbar spine 04/29/2019- enlarging marrow lesions involving the L1 vertebral body, upper left sacrum and right iliac bone MRI pelvis 04/29/2019- 3.5 cm left iliac bone lesion appears slightly larger; other similar appearing lesions present within the left superior pubic  ramus, left superior abdomen acetabulum and upper left sacrum Kappa free light chains with mild elevation 05/12/2019  CTs 05/12/2019- left lower lobe pulmonary mass 3.3 x 3.2 cm; lytic process left iliac bone; spinal lesions; 1.1 cm low-density left kidney lesion; right thyroid enlargement with heterogeneous appearance with potential for multiple discrete lesions Biopsy left lower lobe lung mass 05/26/2019-poorly differentiated carcinoma; positive for cytokeratin 5/6, p63 and TTF-1, no EGFR, BRAF, ALK, ERBB2,ROS, or NTRK alteration Cycle 1 carboplatin/Alimta/pembrolizumab 06/06/2019 Cycle 2 carboplatin/Alimta/pembrolizumab 06/27/2019 Cycle 3 carboplatin/Alimta/pembrolizumab 07/18/2019 Cycle 4 carboplatin/Alimta/pembrolizumab 08/07/2019 CTs 08/27/2019-significant decrease in size of lobulated mass left lower lobe.  Unchanged appearance of subtle bone lesions. Cycle 5 Alimta/pembrolizumab 08/28/2019 Cycle 6 Alimta/pembrolizumab 09/18/2019 Cycle 7 Alimta/pembrolizumab 10/09/2019 Cycle 8 Alimta/pembrolizumab 10/30/2019 Cycle 9 Alimta/pembrolizumab 11/20/2019 CTs 12/09/2019-no evidence of disease progression, left lower lobe nodule slightly decreased in size, stable L1, left sacral, and left pubic ramus metastases Cycle 10 Alimta/pembrolizumab 12/11/2019 Cycle 11 pembrolizumab alone 01/02/2020 (Alimta held due to edema, tenderness, erythema at the lower legs) Cycle 12 pembrolizumab 01/23/2020 Cycle 13 pembrolizumab 02/12/2020 Cycle 14 pembrolizumab 03/04/2020 CTs 03/18/2020-stable left lower lobe lesion, mild sclerosis at the superior endplate of L1 that was previously hypermetabolic, stable small left upper sacral lucent lesion, previous left superior pubic ramus lesion is occult on the CT, CT head negative for malignancy Cycle 15 pembrolizumab 03/25/2020 Cycle 16 pembrolizumab 04/21/2020 Cycle 17 pembrolizumab 05/17/2020 05/19/2020 bone scan-no definite abnormalities to suggest osseous metastases.  Areas  of concern  on prior PET-CT involving left iliac bone and L1 vertebral body showed no abnormalities on the current study Cycle 18 Pembrolizumab 06/10/2020 Cycle 19 Pembrolizumab 06/30/2020 CTs 07/16/2020-stable left lower lobe nodule, stable faint superior L1 vertebral lesion, no evidence of disease progression Cycle 20 pembrolizumab 07/21/2020 Cycle 21 Pembrolizumab 08/11/2020 Cycle 22 pembrolizumab 09/01/2020 Cycle 23 Pembrolizumab 09/22/2020 Cycle 24 pembrolizumab 10/13/2020 Cycle 25 Pembrolizumab 11/03/2020 Cycle 26 pembrolizumab 11/26/2020 CTs 12/15/2020- stable left lower lobe mass, stable sclerotic lesion at L2, no evidence of disease progression Cycle 27 pembrolizumab 12/17/2020 PET scan 01/03/2021-1.8 x 1.3 cm left lower lobe nodule similar in size to CT of 12/15/2020 and measures smaller than previous PET/CT from 2020.  Nodule is markedly hypermetabolic.  No evidence for hypermetabolic hilar or mediastinal lymphadenopathy.  Several tiny foci of hypermetabolism identified in bony anatomy raising concern for skeletal metastases.  Comparison of the PET to CT 07/16/2020-lobular left lower lobe pulmonary nodule measured 2.4 x 1.5 cm on the prior study, current study it measured 1.8 x 1.3 cm.  Lesion in the anterior left acetabulum is similar to the 07/16/2020 exam although overlying cortical thinning slightly more pronounced on the current study.  Described lesion in the scapula shows some cortical sclerosis and a tiny central marrow lucency not substantially changed compared to 07/16/2020.  Left third rib lesion shows heterogeneous mineralization similar to 07/16/2020. MRI of cervical spine 01/13/2021-increased left facet edema at C5-C6, severe facet arthrosis on the left at C7-T1 and on the right at C3-C4, no evidence of metastatic disease Cycle 28 Pembrolizumab 01/14/2021 Cycle 29 Pembrolizumab 02/03/2021 Cycle 30 Pembrolizumab 02/24/2021 MRI left hip 03/02/2021-compared to 04/29/2019, slight increase in size of metastases  at the left iliac and left acetabulum.  Lesion at the left upper sacrum is less conspicuous and a lesion at the left superior pubic ramus is stable Cycle 31 pembrolizumab 03/17/2021 Radiation to left acetabulum and left iliac 03/30/2021-04/13/2021 Cycle 32 pembrolizumab 04/07/2021 Cycle 33 Pembrolizumab 04/28/2021 Cycle 34 pembrolizumab 05/18/2021 PET scan 06/01/2021-no significant change in size or degree of FDG uptake associated with FDG avid left lower lobe lung nodule compatible with neoplasm.  Stable to improved appearance of multifocal FDG avid bone metastasis. Cycle 35 pembrolizumab 06/10/2021 Cycle 36 pembrolizumab 07/01/2021 Cycle 37 pembrolizumab 07/29/2021 Cycle 38 pembrolizumab 08/19/2021 Cycle 39 Pembrolizumab 09/09/2021 Cycle 40 pembrolizumab 09/30/2021 CT chest 10/20/2021 to evaluate complaints of chest pain-no PE.  Interval increase in left lower lobe mass. Cycle 41 Pembrolizumab 10/21/2021 Cycle 42 pembrolizumab 11/11/2021 PET 11/24/2021-mild increase in size of left lower lobe mass, no evidence of solid organ or nodal metastases, stable multifocal bone metastases.  No new sites of metastatic disease. Cycle 43 pembrolizumab 11/29/2021 Cycle 45 Pembrolizumab 12/20/2021 Cycle 46 Pembrolizumab 01/09/2022 Cycle 47 pembrolizumab 02/01/2022 Cycle 48 Pembrolizumab 02/24/2022 Cycle 49 Pembrolizumab 03/17/2022 Cycle 50 pembrolizumab 04/07/2022 MRI l pelvis 04/12/2022-"new "lesion at the left superior acetabulum, stable bone lesions at the left suprapubic ramus, left S1, and left iliac Cycle 51 pembrolizumab 04/28/2022 Palliative radiation to the left hip and left scapula 04/26/2022-05/09/2022 Cycle 52 pembrolizumab 05/18/2022 PET 06/02/2022-stable left lower lobe tumor, slight increase in hypermetabolism associated with the left acetabular metastasis, additional previously seen bone lesions no longer have hypermetabolism Cycle 53 pembrolizumab 06/08/2022 Cycle 54 pembrolizumab 06/29/2022 Cycle 55  pembrolizumab 07/20/2022 Cycle 56 pembrolizumab 08/10/2022 Cycle 57 pembrolizumab 08/31/2022 Cycle 58 pembrolizumab 09/21/2022 Pain secondary to metastatic lung cancer Chronic back pain Type 2 diabetes Essential hypertension CAD Hyperlipidemia Family history significant for multiple members with breast cancer  Grade 1 skin rash 07/18/2019 likely related to immunotherapy.  Topical steroid cream as needed. E. coli urinary tract infection 07/14/2019.  Completed cephalexin. Edema/tenderness at the right greater than left ankle 10/21/2019-etiology unclear, potentially related to systemic therapy or an infection, doxycycline prescribed-improved 10/23/2019; marked improvement 11/20/2019; at office visit 01/02/2020 she reports worsening of lower extremity edema, pain/tenderness, erythema 3 to 4 days following each treatment.  Alimta held 01/02/2020.  Referral to dermatology. COVID-19 infection 03/30/2020, monoclonal antibody therapy 04/06/2020 Arthritis, possibly related to Pembrolizumab, improved since beginning Medrol, now followed by Dr. Amil Amen Pain left femoral head/upper femur/groin-negative plain x-ray; CT pelvis 03/28/2022-no acute appearing bone abnormality at the left hip or involving the proximal left femur, no evidence of acute fracture or dislocation, soft tissues left hip and left groin unremarkable; osseous mets within the left sacrum, left iliac bone and anterior acetabulum without evidence of associated fracture or dislocation. Percutaneous fixation of left pathologic acetabular fracture 08/23/2022     Disposition: [name] continues to tolerate the pembrolizumab well.  She will complete another cycle today.  There is no clinical evidence for progression of the non-small cell lung cancer.  She will call if the upper back pain becomes consistent. She has experienced significant improvement in left hip pain following fixation of the acetabular fracture. Peripheral edema has improved since she  stopped amlodipine.  She will discontinue amlodipine. Ms. Deblanc will return for an office visit and pembrolizumab in 3 weeks.  [name], MD  09/21/2022  9:05 AM

## 2022-09-22 ENCOUNTER — Other Ambulatory Visit: Payer: Self-pay

## 2022-09-29 ENCOUNTER — Other Ambulatory Visit: Payer: Self-pay

## 2022-10-01 ENCOUNTER — Other Ambulatory Visit: Payer: Self-pay

## 2022-10-02 ENCOUNTER — Other Ambulatory Visit: Payer: Self-pay

## 2022-10-12 ENCOUNTER — Inpatient Hospital Stay: Payer: Medicare Other

## 2022-10-12 ENCOUNTER — Inpatient Hospital Stay: Payer: Medicare Other | Attending: Oncology | Admitting: Oncology

## 2022-10-12 ENCOUNTER — Encounter: Payer: Self-pay | Admitting: *Deleted

## 2022-10-12 VITALS — BP 114/41 | HR 60 | Resp 18

## 2022-10-12 DIAGNOSIS — C3492 Malignant neoplasm of unspecified part of left bronchus or lung: Secondary | ICD-10-CM

## 2022-10-12 DIAGNOSIS — Z5112 Encounter for antineoplastic immunotherapy: Secondary | ICD-10-CM | POA: Diagnosis present

## 2022-10-12 DIAGNOSIS — C3432 Malignant neoplasm of lower lobe, left bronchus or lung: Secondary | ICD-10-CM | POA: Diagnosis present

## 2022-10-12 DIAGNOSIS — C7951 Secondary malignant neoplasm of bone: Secondary | ICD-10-CM | POA: Insufficient documentation

## 2022-10-12 LAB — CMP (CANCER CENTER ONLY)
ALT: 17 U/L (ref 0–44)
AST: 20 U/L (ref 15–41)
Albumin: 4.2 g/dL (ref 3.5–5.0)
Alkaline Phosphatase: 54 U/L (ref 38–126)
Anion gap: 7 (ref 5–15)
BUN: 30 mg/dL — ABNORMAL HIGH (ref 8–23)
CO2: 26 mmol/L (ref 22–32)
Calcium: 10.1 mg/dL (ref 8.9–10.3)
Chloride: 104 mmol/L (ref 98–111)
Creatinine: 1.13 mg/dL — ABNORMAL HIGH (ref 0.44–1.00)
GFR, Estimated: 48 mL/min — ABNORMAL LOW (ref >60)
Glucose, Bld: 137 mg/dL — ABNORMAL HIGH (ref 70–99)
Potassium: 4.3 mmol/L (ref 3.5–5.1)
Sodium: 137 mmol/L (ref 135–145)
Total Bilirubin: 0.4 mg/dL (ref 0.3–1.2)
Total Protein: 6.9 g/dL (ref 6.5–8.1)

## 2022-10-12 LAB — CBC WITH DIFFERENTIAL (CANCER CENTER ONLY)
Abs Immature Granulocytes: 0.02 10*3/uL (ref 0.00–0.07)
Basophils Absolute: 0 10*3/uL (ref 0.0–0.1)
Basophils Relative: 1 %
Eosinophils Absolute: 0.1 10*3/uL (ref 0.0–0.5)
Eosinophils Relative: 1 %
HCT: 34 % — ABNORMAL LOW (ref 36.0–46.0)
Hemoglobin: 11.1 g/dL — ABNORMAL LOW (ref 12.0–15.0)
Immature Granulocytes: 1 %
Lymphocytes Relative: 13 %
Lymphs Abs: 0.6 10*3/uL — ABNORMAL LOW (ref 0.7–4.0)
MCH: 30.3 pg (ref 26.0–34.0)
MCHC: 32.6 g/dL (ref 30.0–36.0)
MCV: 92.9 fL (ref 80.0–100.0)
Monocytes Absolute: 0.4 10*3/uL (ref 0.1–1.0)
Monocytes Relative: 10 %
Neutro Abs: 3.2 10*3/uL (ref 1.7–7.7)
Neutrophils Relative %: 74 %
Platelet Count: 169 10*3/uL (ref 150–400)
RBC: 3.66 MIL/uL — ABNORMAL LOW (ref 3.87–5.11)
RDW: 13.8 % (ref 11.5–15.5)
WBC Count: 4.3 10*3/uL (ref 4.0–10.5)
nRBC: 0 % (ref 0.0–0.2)

## 2022-10-12 MED ORDER — SODIUM CHLORIDE 0.9 % IV SOLN: Freq: Once | INTRAVENOUS | Status: AC

## 2022-10-12 MED ORDER — HEPARIN SOD (PORK) LOCK FLUSH 100 UNIT/ML IV SOLN
500.0000 [IU] | Freq: Once | INTRAVENOUS | Status: AC | PRN
  Administered 2022-10-12: 500 [IU]

## 2022-10-12 MED ORDER — SODIUM CHLORIDE 0.9% FLUSH
10.0000 mL | INTRAVENOUS | Status: DC | PRN
  Administered 2022-10-12: 10 mL

## 2022-10-12 MED ORDER — SODIUM CHLORIDE 0.9 % IV SOLN
200.0000 mg | Freq: Once | INTRAVENOUS | Status: AC
  Administered 2022-10-12: 200 mg via INTRAVENOUS
  Filled 2022-10-12: qty 8

## 2022-10-12 NOTE — Progress Notes (Signed)
Patient seen by Dr. Sherrill today ? ?Vitals are within treatment parameters. ? ?Labs reviewed by Dr. Sherrill and are within treatment parameters. ? ?Per physician team, patient is ready for treatment and there are NO modifications to the treatment plan.  ?

## 2022-10-12 NOTE — Progress Notes (Signed)
[facility] OFFICE PROGRESS NOTE   Diagnosis: Lung cancer  INTERVAL HISTORY:   [name] returns as scheduled.  She completed another cycle of pembrolizumab on 09/21/2022.  No rash or diarrhea.  No new complaint.  She continues to have pain at the left hip.  She has pain at the upper back.  She takes one half of a hydrocodone occasionally. She saw Dr. Adaline Sill on 10/10/2022.  X-rays revealed adequate healing following the surgery.  She has been referred for physical therapy.  Objective:  Vital signs in last 24 hours:  Blood pressure (!) 144/65, pulse (!) 56, temperature 98.2 F (36.8 C), temperature source Oral, resp. rate 18, height '5\' 5"'$  (1.651 m), weight 184 lb (83.5 kg), SpO2 97 %.    HEENT: No thrush or ulcers Resp: Scattered end inspiratory rales, no respiratory distress, good air movement bilaterally Cardio: Regular rate and rhythm GI: No hepatosplenomegaly Vascular: No leg edema   Portacath/PICC-without erythema  Lab Results:  Lab Results  Component Value Date   WBC 4.3 10/12/2022   HGB 11.1 (L) 10/12/2022   HCT 34.0 (L) 10/12/2022   MCV 92.9 10/12/2022   PLT 169 10/12/2022   NEUTROABS 3.2 10/12/2022    CMP  Lab Results  Component Value Date   NA 137 10/12/2022   K 4.3 10/12/2022   CL 104 10/12/2022   CO2 26 10/12/2022   GLUCOSE 137 (H) 10/12/2022   BUN 30 (H) 10/12/2022   CREATININE 1.13 (H) 10/12/2022   CALCIUM 10.1 10/12/2022   PROT 6.9 10/12/2022   ALBUMIN 4.2 10/12/2022   AST 20 10/12/2022   ALT 17 10/12/2022   ALKPHOS 54 10/12/2022   BILITOT 0.4 10/12/2022   GFRNONAA 48 (L) 10/12/2022   GFRAA >60 04/21/2020    No results found for: "CEA1", "CEA", "[id number]", "CA125"  Medications: I have reviewed the patient's current medications.   Assessment/Plan: Non-small cell lung cancer MRI lumbar spine 04/29/2019- enlarging marrow lesions involving the L1 vertebral body, upper left sacrum and right iliac bone MRI pelvis 04/29/2019-  3.5 cm left iliac bone lesion appears slightly larger; other similar appearing lesions present within the left superior pubic ramus, left superior abdomen acetabulum and upper left sacrum Kappa free light chains with mild elevation 05/12/2019  CTs 05/12/2019- left lower lobe pulmonary mass 3.3 x 3.2 cm; lytic process left iliac bone; spinal lesions; 1.1 cm low-density left kidney lesion; right thyroid enlargement with heterogeneous appearance with potential for multiple discrete lesions Biopsy left lower lobe lung mass 05/26/2019-poorly differentiated carcinoma; positive for cytokeratin 5/6, p63 and TTF-1, no EGFR, BRAF, ALK, ERBB2,ROS, or NTRK alteration Cycle 1 carboplatin/Alimta/pembrolizumab 06/06/2019 Cycle 2 carboplatin/Alimta/pembrolizumab 06/27/2019 Cycle 3 carboplatin/Alimta/pembrolizumab 07/18/2019 Cycle 4 carboplatin/Alimta/pembrolizumab 08/07/2019 CTs 08/27/2019-significant decrease in size of lobulated mass left lower lobe.  Unchanged appearance of subtle bone lesions. Cycle 5 Alimta/pembrolizumab 08/28/2019 Cycle 6 Alimta/pembrolizumab 09/18/2019 Cycle 7 Alimta/pembrolizumab 10/09/2019 Cycle 8 Alimta/pembrolizumab 10/30/2019 Cycle 9 Alimta/pembrolizumab 11/20/2019 CTs 12/09/2019-no evidence of disease progression, left lower lobe nodule slightly decreased in size, stable L1, left sacral, and left pubic ramus metastases Cycle 10 Alimta/pembrolizumab 12/11/2019 Cycle 11 pembrolizumab alone 01/02/2020 (Alimta held due to edema, tenderness, erythema at the lower legs) Cycle 12 pembrolizumab 01/23/2020 Cycle 13 pembrolizumab 02/12/2020 Cycle 14 pembrolizumab 03/04/2020 CTs 03/18/2020-stable left lower lobe lesion, mild sclerosis at the superior endplate of L1 that was previously hypermetabolic, stable small left upper sacral lucent lesion, previous left superior pubic ramus lesion is occult on the CT, CT head negative for malignancy  Cycle 15 pembrolizumab 03/25/2020 Cycle 16 pembrolizumab 04/21/2020 Cycle  17 pembrolizumab 05/17/2020 05/19/2020 bone scan-no definite abnormalities to suggest osseous metastases.  Areas of concern on prior PET-CT involving left iliac bone and L1 vertebral body showed no abnormalities on the current study Cycle 18 Pembrolizumab 06/10/2020 Cycle 19 Pembrolizumab 06/30/2020 CTs 07/16/2020-stable left lower lobe nodule, stable faint superior L1 vertebral lesion, no evidence of disease progression Cycle 20 pembrolizumab 07/21/2020 Cycle 21 Pembrolizumab 08/11/2020 Cycle 22 pembrolizumab 09/01/2020 Cycle 23 Pembrolizumab 09/22/2020 Cycle 24 pembrolizumab 10/13/2020 Cycle 25 Pembrolizumab 11/03/2020 Cycle 26 pembrolizumab 11/26/2020 CTs 12/15/2020- stable left lower lobe mass, stable sclerotic lesion at L2, no evidence of disease progression Cycle 27 pembrolizumab 12/17/2020 PET scan 01/03/2021-1.8 x 1.3 cm left lower lobe nodule similar in size to CT of 12/15/2020 and measures smaller than previous PET/CT from 2020.  Nodule is markedly hypermetabolic.  No evidence for hypermetabolic hilar or mediastinal lymphadenopathy.  Several tiny foci of hypermetabolism identified in bony anatomy raising concern for skeletal metastases.  Comparison of the PET to CT 07/16/2020-lobular left lower lobe pulmonary nodule measured 2.4 x 1.5 cm on the prior study, current study it measured 1.8 x 1.3 cm.  Lesion in the anterior left acetabulum is similar to the 07/16/2020 exam although overlying cortical thinning slightly more pronounced on the current study.  Described lesion in the scapula shows some cortical sclerosis and a tiny central marrow lucency not substantially changed compared to 07/16/2020.  Left third rib lesion shows heterogeneous mineralization similar to 07/16/2020. MRI of cervical spine 01/13/2021-increased left facet edema at C5-C6, severe facet arthrosis on the left at C7-T1 and on the right at C3-C4, no evidence of metastatic disease Cycle 28 Pembrolizumab 01/14/2021 Cycle 29 Pembrolizumab  02/03/2021 Cycle 30 Pembrolizumab 02/24/2021 MRI left hip 03/02/2021-compared to 04/29/2019, slight increase in size of metastases at the left iliac and left acetabulum.  Lesion at the left upper sacrum is less conspicuous and a lesion at the left superior pubic ramus is stable Cycle 31 pembrolizumab 03/17/2021 Radiation to left acetabulum and left iliac 03/30/2021-04/13/2021 Cycle 32 pembrolizumab 04/07/2021 Cycle 33 Pembrolizumab 04/28/2021 Cycle 34 pembrolizumab 05/18/2021 PET scan 06/01/2021-no significant change in size or degree of FDG uptake associated with FDG avid left lower lobe lung nodule compatible with neoplasm.  Stable to improved appearance of multifocal FDG avid bone metastasis. Cycle 35 pembrolizumab 06/10/2021 Cycle 36 pembrolizumab 07/01/2021 Cycle 37 pembrolizumab 07/29/2021 Cycle 38 pembrolizumab 08/19/2021 Cycle 39 Pembrolizumab 09/09/2021 Cycle 40 pembrolizumab 09/30/2021 CT chest 10/20/2021 to evaluate complaints of chest pain-no PE.  Interval increase in left lower lobe mass. Cycle 41 Pembrolizumab 10/21/2021 Cycle 42 pembrolizumab 11/11/2021 PET 11/24/2021-mild increase in size of left lower lobe mass, no evidence of solid organ or nodal metastases, stable multifocal bone metastases.  No new sites of metastatic disease. Cycle 43 pembrolizumab 11/29/2021 Cycle 45 Pembrolizumab 12/20/2021 Cycle 46 Pembrolizumab 01/09/2022 Cycle 47 pembrolizumab 02/01/2022 Cycle 48 Pembrolizumab 02/24/2022 Cycle 49 Pembrolizumab 03/17/2022 Cycle 50 pembrolizumab 04/07/2022 MRI l pelvis 04/12/2022-"new "lesion at the left superior acetabulum, stable bone lesions at the left suprapubic ramus, left S1, and left iliac Cycle 51 pembrolizumab 04/28/2022 Palliative radiation to the left hip and left scapula 04/26/2022-05/09/2022 Cycle 52 pembrolizumab 05/18/2022 PET 06/02/2022-stable left lower lobe tumor, slight increase in hypermetabolism associated with the left acetabular metastasis, additional previously seen bone  lesions no longer have hypermetabolism Cycle 53 pembrolizumab 06/08/2022 Cycle 54 pembrolizumab 06/29/2022 Cycle 55 pembrolizumab 07/20/2022 Cycle 56 pembrolizumab 08/10/2022 Cycle 57 pembrolizumab 08/31/2022 Cycle 58 pembrolizumab 09/21/2022 Cycle 59  pembrolizumab 10/12/2022 Pain secondary to metastatic lung cancer Chronic back pain Type 2 diabetes Essential hypertension CAD Hyperlipidemia Family history significant for multiple members with breast cancer Grade 1 skin rash 07/18/2019 likely related to immunotherapy.  Topical steroid cream as needed. E. coli urinary tract infection 07/14/2019.  Completed cephalexin. Edema/tenderness at the right greater than left ankle 10/21/2019-etiology unclear, potentially related to systemic therapy or an infection, doxycycline prescribed-improved 10/23/2019; marked improvement 11/20/2019; at office visit 01/02/2020 she reports worsening of lower extremity edema, pain/tenderness, erythema 3 to 4 days following each treatment.  Alimta held 01/02/2020.  Referral to dermatology. COVID-19 infection 03/30/2020, monoclonal antibody therapy 04/06/2020 Arthritis, possibly related to Pembrolizumab, improved since beginning Medrol, now followed by Dr. Amil Amen Pain left femoral head/upper femur/groin-negative plain x-ray; CT pelvis 03/28/2022-no acute appearing bone abnormality at the left hip or involving the proximal left femur, no evidence of acute fracture or dislocation, soft tissues left hip and left groin unremarkable; osseous mets within the left sacrum, left iliac bone and anterior acetabulum without evidence of associated fracture or dislocation. Percutaneous fixation of left pathologic acetabular fracture 08/23/2022      Disposition: [name] appears stable.  She will complete another treatment with pembrolizumab today.  She will begin physical therapy for the left hip next week.  [name] will return for an office visit and pembrolizumab in 3 weeks.  We will  plan for a restaging PET in May.  [name], MD  10/12/2022  10:43 AM

## 2022-10-12 NOTE — Patient Instructions (Signed)
[facility]   Discharge Instructions: Thank you for choosing [facility] to provide your oncology and hematology care.   If you have a lab appointment with the Cancer Center, please go directly to the Cancer Center and check in at the registration area.   Wear comfortable clothing and clothing appropriate for easy access to any Portacath or PICC line.   We strive to give you quality time with your provider. You may need to reschedule your appointment if you arrive late (15 or more minutes).  Arriving late affects you and other patients whose appointments are after yours.  Also, if you miss three or more appointments without notifying the office, you may be dismissed from the clinic at the provider's discretion.      For prescription refill requests, have your pharmacy contact our office and allow 72 hours for refills to be completed.    Today you received the following chemotherapy and/or immunotherapy agents Pembrolizumab (KEYTRUDA).      To help prevent nausea and vomiting after your treatment, we encourage you to take your nausea medication as directed.  BELOW ARE SYMPTOMS THAT SHOULD BE REPORTED IMMEDIATELY: *FEVER GREATER THAN 100.4 F (38 C) OR HIGHER *CHILLS OR SWEATING *NAUSEA AND VOMITING THAT IS NOT CONTROLLED WITH YOUR NAUSEA MEDICATION *UNUSUAL SHORTNESS OF BREATH *UNUSUAL BRUISING OR BLEEDING *URINARY PROBLEMS (pain or burning when urinating, or frequent urination) *BOWEL PROBLEMS (unusual diarrhea, constipation, pain near the anus) TENDERNESS IN MOUTH AND THROAT WITH OR WITHOUT PRESENCE OF ULCERS (sore throat, sores in mouth, or a toothache) UNUSUAL RASH, SWELLING OR PAIN  UNUSUAL VAGINAL DISCHARGE OR ITCHING   Items with * indicate a potential emergency and should be followed up as soon as possible or go to the Emergency Department if any problems should occur.  Please show the CHEMOTHERAPY ALERT CARD or IMMUNOTHERAPY  ALERT CARD at check-in to the Emergency Department and triage nurse.  Should you have questions after your visit or need to cancel or reschedule your appointment, please contact [facility]  Dept: [phone]  and follow the prompts.  Office hours are 8:00 a.m. to 4:30 p.m. Monday - Friday. Please note that voicemails left after 4:00 p.m. may not be returned until the following business day.  We are closed weekends and major holidays. You have access to a nurse at all times for urgent questions. Please call the main number to the clinic Dept: [phone] and follow the prompts.   For any non-urgent questions, you may also contact your provider using MyChart. We now offer e-Visits for anyone 18 and older to request care online for non-urgent symptoms. For details visit [email or web address].   Also download the MyChart app! Go to the app store, search "MyChart", open the app, select [facility], and log in with your MyChart username and password.  Pembrolizumab Injection What is this medication? PEMBROLIZUMAB (PEM broe LIZ ue mab) treats some types of cancer. It works by helping your immune system slow or stop the spread of cancer cells. It is a monoclonal antibody. This medicine may be used for other purposes; ask your health care provider or pharmacist if you have questions. COMMON BRAND NAME(S): Keytruda What should I tell my care team before I take this medication? They need to know if you have any of these conditions: Allogeneic stem cell transplant (uses someone else's stem cells) Autoimmune diseases, such as Crohn disease, ulcerative colitis, lupus History of chest radiation Nervous   system problems, such as Guillain-Barre syndrome, myasthenia gravis Organ transplant An unusual or allergic reaction to pembrolizumab, other medications, foods, dyes, or preservatives Pregnant or trying to get pregnant Breast-feeding How should I use this  medication? This medication is injected into a vein. It is given by your care team in a hospital or clinic setting. A special MedGuide will be given to you before each treatment. Be sure to read this information carefully each time. Talk to your care team about the use of this medication in children. While it may be prescribed for children as young as 6 months for selected conditions, precautions do apply. Overdosage: If you think you have taken too much of this medicine contact a poison control center or emergency room at once. NOTE: This medicine is only for you. Do not share this medicine with others. What if I miss a dose? Keep appointments for follow-up doses. It is important not to miss your dose. Call your care team if you are unable to keep an appointment. What may interact with this medication? Interactions have not been studied. This list may not describe all possible interactions. Give your health care provider a list of all the medicines, herbs, non-prescription drugs, or dietary supplements you use. Also tell them if you smoke, drink alcohol, or use illegal drugs. Some items may interact with your medicine. What should I watch for while using this medication? Your condition will be monitored carefully while you are receiving this medication. You may need blood work while taking this medication. This medication may cause serious skin reactions. They can happen weeks to months after starting the medication. Contact your care team right away if you notice fevers or flu-like symptoms with a rash. The rash may be red or purple and then turn into blisters or peeling of the skin. You may also notice a red rash with swelling of the face, lips, or lymph nodes in your neck or under your arms. Tell your care team right away if you have any change in your eyesight. Talk to your care team if you may be pregnant. Serious birth defects can occur if you take this medication during pregnancy and for 4  months after the last dose. You will need a negative pregnancy test before starting this medication. Contraception is recommended while taking this medication and for 4 months after the last dose. Your care team can help you find the option that works for you. Do not breastfeed while taking this medication and for 4 months after the last dose. What side effects may I notice from receiving this medication? Side effects that you should report to your care team as soon as possible: Allergic reactions--skin rash, itching, hives, swelling of the face, lips, tongue, or throat Dry cough, shortness of breath or trouble breathing Eye pain, redness, irritation, or discharge with blurry or decreased vision Heart muscle inflammation--unusual weakness or fatigue, shortness of breath, chest pain, fast or irregular heartbeat, dizziness, swelling of the ankles, feet, or hands Hormone gland problems--headache, sensitivity to light, unusual weakness or fatigue, dizziness, fast or irregular heartbeat, increased sensitivity to cold or heat, excessive sweating, constipation, hair loss, increased thirst or amount of urine, tremors or shaking, irritability Infusion reactions--chest pain, shortness of breath or trouble breathing, feeling faint or lightheaded Kidney injury (glomerulonephritis)--decrease in the amount of urine, red or dark brown urine, foamy or bubbly urine, swelling of the ankles, hands, or feet Liver injury--right upper belly pain, loss of appetite, nausea, light-colored stool, dark yellow or   brown urine, yellowing skin or eyes, unusual weakness or fatigue Pain, tingling, or numbness in the hands or feet, muscle weakness, change in vision, confusion or trouble speaking, loss of balance or coordination, trouble walking, seizures Rash, fever, and swollen lymph nodes Redness, blistering, peeling, or loosening of the skin, including inside the mouth Sudden or severe stomach pain, bloody diarrhea, fever, nausea,  vomiting Side effects that usually do not require medical attention (report to your care team if they continue or are bothersome): Bone, joint, or muscle pain Diarrhea Fatigue Loss of appetite Nausea Skin rash This list may not describe all possible side effects. Call your doctor for medical advice about side effects. You may report side effects to FDA at 1-800-FDA-1088. Where should I keep my medication? This medication is given in a hospital or clinic. It will not be stored at home. NOTE: This sheet is a summary. It may not cover all possible information. If you have questions about this medicine, talk to your doctor, pharmacist, or health care provider.  2023 Elsevier/Gold Standard (2021-11-29 00:00:00)   

## 2022-10-16 ENCOUNTER — Telehealth: Payer: Self-pay

## 2022-10-16 NOTE — Telephone Encounter (Signed)
error 

## 2022-10-19 ENCOUNTER — Ambulatory Visit: Payer: Medicare Other | Admitting: Physical Therapy

## 2022-10-23 ENCOUNTER — Ambulatory Visit: Payer: Medicare Other | Admitting: Physical Therapy

## 2022-10-29 ENCOUNTER — Other Ambulatory Visit: Payer: Self-pay | Admitting: Oncology

## 2022-11-02 ENCOUNTER — Encounter (HOSPITAL_BASED_OUTPATIENT_CLINIC_OR_DEPARTMENT_OTHER): Payer: Self-pay

## 2022-11-02 ENCOUNTER — Inpatient Hospital Stay: Payer: Medicare Other | Admitting: Nurse Practitioner

## 2022-11-02 ENCOUNTER — Inpatient Hospital Stay: Payer: Medicare Other | Attending: Oncology

## 2022-11-02 ENCOUNTER — Inpatient Hospital Stay: Payer: Medicare Other

## 2022-11-02 ENCOUNTER — Ambulatory Visit (HOSPITAL_BASED_OUTPATIENT_CLINIC_OR_DEPARTMENT_OTHER)
Admission: RE | Admit: 2022-11-02 | Discharge: 2022-11-02 | Disposition: A | Discharge status: Home / Self Care | Payer: Medicare Other | Source: Ambulatory Visit | Attending: Nurse Practitioner | Admitting: Nurse Practitioner

## 2022-11-02 ENCOUNTER — Encounter: Payer: Self-pay | Admitting: Nurse Practitioner

## 2022-11-02 VITALS — BP 94/51 | HR 60 | Resp 20

## 2022-11-02 VITALS — BP 131/58 | HR 77 | Temp 98.1°F | Resp 18 | Ht 65.0 in | Wt 185.0 lb

## 2022-11-02 DIAGNOSIS — C3492 Malignant neoplasm of unspecified part of left bronchus or lung: Secondary | ICD-10-CM

## 2022-11-02 DIAGNOSIS — Z5112 Encounter for antineoplastic immunotherapy: Secondary | ICD-10-CM | POA: Insufficient documentation

## 2022-11-02 DIAGNOSIS — C7951 Secondary malignant neoplasm of bone: Secondary | ICD-10-CM | POA: Insufficient documentation

## 2022-11-02 DIAGNOSIS — C3432 Malignant neoplasm of lower lobe, left bronchus or lung: Secondary | ICD-10-CM | POA: Insufficient documentation

## 2022-11-02 LAB — CMP (CANCER CENTER ONLY)
ALT: 15 U/L (ref 0–44)
AST: 18 U/L (ref 15–41)
Albumin: 4.1 g/dL (ref 3.5–5.0)
Alkaline Phosphatase: 57 U/L (ref 38–126)
Anion gap: 6 (ref 5–15)
BUN: 27 mg/dL — ABNORMAL HIGH (ref 8–23)
CO2: 26 mmol/L (ref 22–32)
Calcium: 10 mg/dL (ref 8.9–10.3)
Chloride: 103 mmol/L (ref 98–111)
Creatinine: 0.96 mg/dL (ref 0.44–1.00)
GFR, Estimated: 58 mL/min — ABNORMAL LOW (ref >60)
Glucose, Bld: 135 mg/dL — ABNORMAL HIGH (ref 70–99)
Potassium: 4.1 mmol/L (ref 3.5–5.1)
Sodium: 135 mmol/L (ref 135–145)
Total Bilirubin: 0.4 mg/dL (ref 0.3–1.2)
Total Protein: 6.4 g/dL — ABNORMAL LOW (ref 6.5–8.1)

## 2022-11-02 LAB — CBC WITH DIFFERENTIAL (CANCER CENTER ONLY)
Abs Immature Granulocytes: 0.01 10*3/uL (ref 0.00–0.07)
Basophils Absolute: 0 10*3/uL (ref 0.0–0.1)
Basophils Relative: 0 %
Eosinophils Absolute: 0.1 10*3/uL (ref 0.0–0.5)
Eosinophils Relative: 2 %
HCT: 31.7 % — ABNORMAL LOW (ref 36.0–46.0)
Hemoglobin: 10.3 g/dL — ABNORMAL LOW (ref 12.0–15.0)
Immature Granulocytes: 0 %
Lymphocytes Relative: 12 %
Lymphs Abs: 0.6 10*3/uL — ABNORMAL LOW (ref 0.7–4.0)
MCH: 30.7 pg (ref 26.0–34.0)
MCHC: 32.5 g/dL (ref 30.0–36.0)
MCV: 94.6 fL (ref 80.0–100.0)
Monocytes Absolute: 0.4 10*3/uL (ref 0.1–1.0)
Monocytes Relative: 8 %
Neutro Abs: 3.7 10*3/uL (ref 1.7–7.7)
Neutrophils Relative %: 78 %
Platelet Count: 162 10*3/uL (ref 150–400)
RBC: 3.35 MIL/uL — ABNORMAL LOW (ref 3.87–5.11)
RDW: 13.5 % (ref 11.5–15.5)
WBC Count: 4.7 10*3/uL (ref 4.0–10.5)
nRBC: 0 % (ref 0.0–0.2)

## 2022-11-02 MED ORDER — HYDROCODONE-ACETAMINOPHEN 10-325 MG PO TABS
1.0000 | ORAL_TABLET | ORAL | 0 refills | Status: DC | PRN
Start: 2022-11-02 — End: 2022-11-23

## 2022-11-02 MED ORDER — HEPARIN SOD (PORK) LOCK FLUSH 100 UNIT/ML IV SOLN
500.0000 [IU] | Freq: Once | INTRAVENOUS | Status: AC | PRN
  Administered 2022-11-02: 500 [IU]

## 2022-11-02 MED ORDER — SODIUM CHLORIDE 0.9% FLUSH
10.0000 mL | INTRAVENOUS | Status: DC | PRN
  Administered 2022-11-02: 10 mL

## 2022-11-02 MED ORDER — SODIUM CHLORIDE 0.9 % IV SOLN: Freq: Once | INTRAVENOUS | Status: AC

## 2022-11-02 MED ORDER — SODIUM CHLORIDE 0.9 % IV SOLN
200.0000 mg | Freq: Once | INTRAVENOUS | Status: AC
  Administered 2022-11-02: 200 mg via INTRAVENOUS
  Filled 2022-11-02: qty 8

## 2022-11-02 NOTE — Patient Instructions (Signed)

## 2022-11-02 NOTE — Patient Instructions (Signed)
[facility]   Discharge Instructions: Thank you for choosing [facility] to provide your oncology and hematology care.   If you have a lab appointment with the Cancer Center, please go directly to the Cancer Center and check in at the registration area.   Wear comfortable clothing and clothing appropriate for easy access to any Portacath or PICC line.   We strive to give you quality time with your provider. You may need to reschedule your appointment if you arrive late (15 or more minutes).  Arriving late affects you and other patients whose appointments are after yours.  Also, if you miss three or more appointments without notifying the office, you may be dismissed from the clinic at the provider's discretion.      For prescription refill requests, have your pharmacy contact our office and allow 72 hours for refills to be completed.    Today you received the following chemotherapy and/or immunotherapy agents Pembrolizumab (KEYTRUDA).      To help prevent nausea and vomiting after your treatment, we encourage you to take your nausea medication as directed.  BELOW ARE SYMPTOMS THAT SHOULD BE REPORTED IMMEDIATELY: *FEVER GREATER THAN 100.4 F (38 C) OR HIGHER *CHILLS OR SWEATING *NAUSEA AND VOMITING THAT IS NOT CONTROLLED WITH YOUR NAUSEA MEDICATION *UNUSUAL SHORTNESS OF BREATH *UNUSUAL BRUISING OR BLEEDING *URINARY PROBLEMS (pain or burning when urinating, or frequent urination) *BOWEL PROBLEMS (unusual diarrhea, constipation, pain near the anus) TENDERNESS IN MOUTH AND THROAT WITH OR WITHOUT PRESENCE OF ULCERS (sore throat, sores in mouth, or a toothache) UNUSUAL RASH, SWELLING OR PAIN  UNUSUAL VAGINAL DISCHARGE OR ITCHING   Items with * indicate a potential emergency and should be followed up as soon as possible or go to the Emergency Department if any problems should occur.  Please show the CHEMOTHERAPY ALERT CARD or IMMUNOTHERAPY  ALERT CARD at check-in to the Emergency Department and triage nurse.  Should you have questions after your visit or need to cancel or reschedule your appointment, please contact [facility]  Dept: [phone]  and follow the prompts.  Office hours are 8:00 a.m. to 4:30 p.m. Monday - Friday. Please note that voicemails left after 4:00 p.m. may not be returned until the following business day.  We are closed weekends and major holidays. You have access to a nurse at all times for urgent questions. Please call the main number to the clinic Dept: [phone] and follow the prompts.   For any non-urgent questions, you may also contact your provider using MyChart. We now offer e-Visits for anyone 18 and older to request care online for non-urgent symptoms. For details visit [name].   Also download the MyChart app! Go to the app store, search "MyChart", open the app, select [facility], and log in with your MyChart username and password.  Pembrolizumab Injection What is this medication? PEMBROLIZUMAB (PEM broe LIZ ue mab) treats some types of cancer. It works by helping your immune system slow or stop the spread of cancer cells. It is a monoclonal antibody. This medicine may be used for other purposes; ask your health care provider or pharmacist if you have questions. COMMON BRAND NAME(S): Keytruda What should I tell my care team before I take this medication? They need to know if you have any of these conditions: Allogeneic stem cell transplant (uses someone else's stem cells) Autoimmune diseases, such as Crohn disease, ulcerative colitis, lupus History of chest radiation Nervous   system problems, such as Guillain-Barre syndrome, myasthenia gravis Organ transplant An unusual or allergic reaction to pembrolizumab, other medications, foods, dyes, or preservatives Pregnant or trying to get pregnant Breast-feeding How should I use this  medication? This medication is injected into a vein. It is given by your care team in a hospital or clinic setting. A special MedGuide will be given to you before each treatment. Be sure to read this information carefully each time. Talk to your care team about the use of this medication in children. While it may be prescribed for children as young as 6 months for selected conditions, precautions do apply. Overdosage: If you think you have taken too much of this medicine contact a poison control center or emergency room at once. NOTE: This medicine is only for you. Do not share this medicine with others. What if I miss a dose? Keep appointments for follow-up doses. It is important not to miss your dose. Call your care team if you are unable to keep an appointment. What may interact with this medication? Interactions have not been studied. This list may not describe all possible interactions. Give your health care provider a list of all the medicines, herbs, non-prescription drugs, or dietary supplements you use. Also tell them if you smoke, drink alcohol, or use illegal drugs. Some items may interact with your medicine. What should I watch for while using this medication? Your condition will be monitored carefully while you are receiving this medication. You may need blood work while taking this medication. This medication may cause serious skin reactions. They can happen weeks to months after starting the medication. Contact your care team right away if you notice fevers or flu-like symptoms with a rash. The rash may be red or purple and then turn into blisters or peeling of the skin. You may also notice a red rash with swelling of the face, lips, or lymph nodes in your neck or under your arms. Tell your care team right away if you have any change in your eyesight. Talk to your care team if you may be pregnant. Serious birth defects can occur if you take this medication during pregnancy and for 4  months after the last dose. You will need a negative pregnancy test before starting this medication. Contraception is recommended while taking this medication and for 4 months after the last dose. Your care team can help you find the option that works for you. Do not breastfeed while taking this medication and for 4 months after the last dose. What side effects may I notice from receiving this medication? Side effects that you should report to your care team as soon as possible: Allergic reactions--skin rash, itching, hives, swelling of the face, lips, tongue, or throat Dry cough, shortness of breath or trouble breathing Eye pain, redness, irritation, or discharge with blurry or decreased vision Heart muscle inflammation--unusual weakness or fatigue, shortness of breath, chest pain, fast or irregular heartbeat, dizziness, swelling of the ankles, feet, or hands Hormone gland problems--headache, sensitivity to light, unusual weakness or fatigue, dizziness, fast or irregular heartbeat, increased sensitivity to cold or heat, excessive sweating, constipation, hair loss, increased thirst or amount of urine, tremors or shaking, irritability Infusion reactions--chest pain, shortness of breath or trouble breathing, feeling faint or lightheaded Kidney injury (glomerulonephritis)--decrease in the amount of urine, red or dark brown urine, foamy or bubbly urine, swelling of the ankles, hands, or feet Liver injury--right upper belly pain, loss of appetite, nausea, light-colored stool, dark yellow or   brown urine, yellowing skin or eyes, unusual weakness or fatigue Pain, tingling, or numbness in the hands or feet, muscle weakness, change in vision, confusion or trouble speaking, loss of balance or coordination, trouble walking, seizures Rash, fever, and swollen lymph nodes Redness, blistering, peeling, or loosening of the skin, including inside the mouth Sudden or severe stomach pain, bloody diarrhea, fever, nausea,  vomiting Side effects that usually do not require medical attention (report to your care team if they continue or are bothersome): Bone, joint, or muscle pain Diarrhea Fatigue Loss of appetite Nausea Skin rash This list may not describe all possible side effects. Call your doctor for medical advice about side effects. You may report side effects to FDA at 1-800-FDA-1088. Where should I keep my medication? This medication is given in a hospital or clinic. It will not be stored at home. NOTE: This sheet is a summary. It may not cover all possible information. If you have questions about this medicine, talk to your doctor, pharmacist, or health care provider.  2023 Elsevier/Gold Standard (2021-11-29 00:00:00)   

## 2022-11-02 NOTE — Progress Notes (Signed)
Patient seen by [name] NP today  Vitals are within treatment parameters.  Labs reviewed by [name] NP and are within treatment parameters.  Per physician team, patient is ready for treatment and there are NO modifications to the treatment plan.     

## 2022-11-02 NOTE — Progress Notes (Signed)
[facility] OFFICE PROGRESS NOTE   Diagnosis: Lung cancer  INTERVAL HISTORY:   [name] returns as scheduled.  She completed another cycle of Pembrolizumab 10/12/2022.  No rash or diarrhea.  She reports "horrible" pain at the left buttock, hip, leg.  The pain has worsened over the past 3 weeks.  She reports recent plain x-ray showed no fracture.  She is taking 5 mg about every 2 hours to keep the pain under control.  She reports generalized leg weakness due to pain.  No bowel or bladder dysfunction.  Objective:  Vital signs in last 24 hours:  Blood pressure (!) 131/58, pulse 77, temperature 98.1 F (36.7 C), temperature source Oral, resp. rate 18, height 5\' 5"  (1.651 m), weight 185 lb (83.9 kg), SpO2 95 %.    HEENT: No thrush or ulcers. Resp: Lungs clear bilaterally. Cardio: Regular rate and rhythm. GI: Abdomen soft and nontender.  No hepatosplenomegaly. Vascular: No leg edema. Neuro: Alert and oriented.  Follows commands.  Moves all extremities.  Left leg strength appears intact.   Skin: No rash. Port-A-Cath without erythema.  Lab Results:  Lab Results  Component Value Date   WBC 4.3 10/12/2022   HGB 11.1 (L) 10/12/2022   HCT 34.0 (L) 10/12/2022   MCV 92.9 10/12/2022   PLT 169 10/12/2022   NEUTROABS 3.2 10/12/2022    Imaging:  No results found.  Medications: I have reviewed the patient's current medications.  Assessment/Plan: Non-small cell lung cancer MRI lumbar spine 04/29/2019- enlarging marrow lesions involving the L1 vertebral body, upper left sacrum and right iliac bone MRI pelvis 04/29/2019- 3.5 cm left iliac bone lesion appears slightly larger; other similar appearing lesions present within the left superior pubic ramus, left superior abdomen acetabulum and upper left sacrum Kappa free light chains with mild elevation 05/12/2019  CTs 05/12/2019- left lower lobe pulmonary mass 3.3 x 3.2 cm; lytic process left iliac bone; spinal lesions; 1.1  cm low-density left kidney lesion; right thyroid enlargement with heterogeneous appearance with potential for multiple discrete lesions Biopsy left lower lobe lung mass 05/26/2019-poorly differentiated carcinoma; positive for cytokeratin 5/6, p63 and TTF-1, no EGFR, BRAF, ALK, ERBB2,ROS, or NTRK alteration Cycle 1 carboplatin/Alimta/pembrolizumab 06/06/2019 Cycle 2 carboplatin/Alimta/pembrolizumab 06/27/2019 Cycle 3 carboplatin/Alimta/pembrolizumab 07/18/2019 Cycle 4 carboplatin/Alimta/pembrolizumab 08/07/2019 CTs 08/27/2019-significant decrease in size of lobulated mass left lower lobe.  Unchanged appearance of subtle bone lesions. Cycle 5 Alimta/pembrolizumab 08/28/2019 Cycle 6 Alimta/pembrolizumab 09/18/2019 Cycle 7 Alimta/pembrolizumab 10/09/2019 Cycle 8 Alimta/pembrolizumab 10/30/2019 Cycle 9 Alimta/pembrolizumab 11/20/2019 CTs 12/09/2019-no evidence of disease progression, left lower lobe nodule slightly decreased in size, stable L1, left sacral, and left pubic ramus metastases Cycle 10 Alimta/pembrolizumab 12/11/2019 Cycle 11 pembrolizumab alone 01/02/2020 (Alimta held due to edema, tenderness, erythema at the lower legs) Cycle 12 pembrolizumab 01/23/2020 Cycle 13 pembrolizumab 02/12/2020 Cycle 14 pembrolizumab 03/04/2020 CTs 03/18/2020-stable left lower lobe lesion, mild sclerosis at the superior endplate of L1 that was previously hypermetabolic, stable small left upper sacral lucent lesion, previous left superior pubic ramus lesion is occult on the CT, CT head negative for malignancy Cycle 15 pembrolizumab 03/25/2020 Cycle 16 pembrolizumab 04/21/2020 Cycle 17 pembrolizumab 05/17/2020 05/19/2020 bone scan-no definite abnormalities to suggest osseous metastases.  Areas of concern on prior PET-CT involving left iliac bone and L1 vertebral body showed no abnormalities on the current study Cycle 18 Pembrolizumab 06/10/2020 Cycle 19 Pembrolizumab 06/30/2020 CTs 07/16/2020-stable left lower lobe nodule, stable  faint superior L1 vertebral lesion, no evidence of disease progression Cycle 20 pembrolizumab 07/21/2020 Cycle 21  Pembrolizumab 08/11/2020 Cycle 22 pembrolizumab 09/01/2020 Cycle 23 Pembrolizumab 09/22/2020 Cycle 24 pembrolizumab 10/13/2020 Cycle 25 Pembrolizumab 11/03/2020 Cycle 26 pembrolizumab 11/26/2020 CTs 12/15/2020- stable left lower lobe mass, stable sclerotic lesion at L2, no evidence of disease progression Cycle 27 pembrolizumab 12/17/2020 PET scan 01/03/2021-1.8 x 1.3 cm left lower lobe nodule similar in size to CT of 12/15/2020 and measures smaller than previous PET/CT from 2020.  Nodule is markedly hypermetabolic.  No evidence for hypermetabolic hilar or mediastinal lymphadenopathy.  Several tiny foci of hypermetabolism identified in bony anatomy raising concern for skeletal metastases.  Comparison of the PET to CT 07/16/2020-lobular left lower lobe pulmonary nodule measured 2.4 x 1.5 cm on the prior study, current study it measured 1.8 x 1.3 cm.  Lesion in the anterior left acetabulum is similar to the 07/16/2020 exam although overlying cortical thinning slightly more pronounced on the current study.  Described lesion in the scapula shows some cortical sclerosis and a tiny central marrow lucency not substantially changed compared to 07/16/2020.  Left third rib lesion shows heterogeneous mineralization similar to 07/16/2020. MRI of cervical spine 01/13/2021-increased left facet edema at C5-C6, severe facet arthrosis on the left at C7-T1 and on the right at C3-C4, no evidence of metastatic disease Cycle 28 Pembrolizumab 01/14/2021 Cycle 29 Pembrolizumab 02/03/2021 Cycle 30 Pembrolizumab 02/24/2021 MRI left hip 03/02/2021-compared to 04/29/2019, slight increase in size of metastases at the left iliac and left acetabulum.  Lesion at the left upper sacrum is less conspicuous and a lesion at the left superior pubic ramus is stable Cycle 31 pembrolizumab 03/17/2021 Radiation to left acetabulum and left iliac  03/30/2021-04/13/2021 Cycle 32 pembrolizumab 04/07/2021 Cycle 33 Pembrolizumab 04/28/2021 Cycle 34 pembrolizumab 05/18/2021 PET scan 06/01/2021-no significant change in size or degree of FDG uptake associated with FDG avid left lower lobe lung nodule compatible with neoplasm.  Stable to improved appearance of multifocal FDG avid bone metastasis. Cycle 35 pembrolizumab 06/10/2021 Cycle 36 pembrolizumab 07/01/2021 Cycle 37 pembrolizumab 07/29/2021 Cycle 38 pembrolizumab 08/19/2021 Cycle 39 Pembrolizumab 09/09/2021 Cycle 40 pembrolizumab 09/30/2021 CT chest 10/20/2021 to evaluate complaints of chest pain-no PE.  Interval increase in left lower lobe mass. Cycle 41 Pembrolizumab 10/21/2021 Cycle 42 pembrolizumab 11/11/2021 PET 11/24/2021-mild increase in size of left lower lobe mass, no evidence of solid organ or nodal metastases, stable multifocal bone metastases.  No new sites of metastatic disease. Cycle 43 pembrolizumab 11/29/2021 Cycle 45 Pembrolizumab 12/20/2021 Cycle 46 Pembrolizumab 01/09/2022 Cycle 47 pembrolizumab 02/01/2022 Cycle 48 Pembrolizumab 02/24/2022 Cycle 49 Pembrolizumab 03/17/2022 Cycle 50 pembrolizumab 04/07/2022 MRI l pelvis 04/12/2022-"new "lesion at the left superior acetabulum, stable bone lesions at the left suprapubic ramus, left S1, and left iliac Cycle 51 pembrolizumab 04/28/2022 Palliative radiation to the left hip and left scapula 04/26/2022-05/09/2022 Cycle 52 pembrolizumab 05/18/2022 PET 06/02/2022-stable left lower lobe tumor, slight increase in hypermetabolism associated with the left acetabular metastasis, additional previously seen bone lesions no longer have hypermetabolism Cycle 53 pembrolizumab 06/08/2022 Cycle 54 pembrolizumab 06/29/2022 Cycle 55 pembrolizumab 07/20/2022 Cycle 56 pembrolizumab 08/10/2022 Cycle 57 pembrolizumab 08/31/2022 Cycle 58 pembrolizumab 09/21/2022 Cycle 59 pembrolizumab 10/12/2022 Cycle 60 Pembrolizumab 11/02/2022 Pain secondary to metastatic lung  cancer Chronic back pain Type 2 diabetes Essential hypertension CAD Hyperlipidemia Family history significant for multiple members with breast cancer Grade 1 skin rash 07/18/2019 likely related to immunotherapy.  Topical steroid cream as needed. E. coli urinary tract infection 07/14/2019.  Completed cephalexin. Edema/tenderness at the right greater than left ankle 10/21/2019-etiology unclear, potentially related to systemic therapy or an infection, doxycycline prescribed-improved  10/23/2019; marked improvement 11/20/2019; at office visit 01/02/2020 she reports worsening of lower extremity edema, pain/tenderness, erythema 3 to 4 days following each treatment.  Alimta held 01/02/2020.  Referral to dermatology. COVID-19 infection 03/30/2020, monoclonal antibody therapy 04/06/2020 Arthritis, possibly related to Pembrolizumab, improved since beginning Medrol, now followed by Dr. Amil Amen Pain left femoral head/upper femur/groin-negative plain x-ray; CT pelvis 03/28/2022-no acute appearing bone abnormality at the left hip or involving the proximal left femur, no evidence of acute fracture or dislocation, soft tissues left hip and left groin unremarkable; osseous mets within the left sacrum, left iliac bone and anterior acetabulum without evidence of associated fracture or dislocation. Percutaneous fixation of left pathologic acetabular fracture 08/23/2022    Disposition: [name] is on active treatment with every 3-week Pembrolizumab.  She continues to tolerate well.  Plan to proceed with Pembrolizumab today as scheduled.  CBC and chemistry panel reviewed.  Labs adequate to proceed as above.    She has worsening pain at the left buttock/hip/leg.  Recent plain x-ray did not show etiology.  We are referring her for noncontrast CT left hip and pelvis.  New hydrocodone prescription sent to her pharmacy.  She will return for follow-up as scheduled in 3 weeks.  We are available to see her sooner if  needed.  Patient seen with Dr. Benay Spice.    Ned Card ANP/GNP-BC   11/02/2022  8:44 AM  This was a shared visit with Ned Card.  [name] was interviewed and examined.  She has increased pain at the left pelvis and the left leg.  It is unclear whether the pain is related to the treated acetabular fracture or metastatic disease involving other site.  She will be referred for a CT of the left hip and pelvis today.  She will continue hydrocodone as needed for pain.  We will consider referring her for palliative radiation or additional orthopedic evaluation pending the CT finding.  I was present for greater than 50% of today's visit.  I performed medical decision making.  Julieanne Manson, MD

## 2022-11-03 ENCOUNTER — Other Ambulatory Visit: Payer: Self-pay

## 2022-11-03 ENCOUNTER — Telehealth: Payer: Self-pay

## 2022-11-03 NOTE — Telephone Encounter (Signed)
I contacted [facility] to request that Dr. Andrey Campanile review a CT scan of a patient taken on April 4th, 2024. [name] Lyons from [facility] called to confirm receipt of my message and informed me that Dr. Andrey Campanile will be back in the office on Tuesday. Dr. Andrey Campanile will arrange for a provider to review the scan at that time.

## 2022-11-07 ENCOUNTER — Encounter: Payer: Self-pay | Admitting: Oncology

## 2022-11-08 ENCOUNTER — Telehealth: Payer: Self-pay

## 2022-11-08 NOTE — Telephone Encounter (Signed)
[name], a Designer, jewellery, contacted [place] from Dr. Tawana Scale office. She relayed that Dr. Andrey Campanile believes no further action is required at this time. He will be sending the Ct scan to Dr. Andrena Mews for review and recommendations.

## 2022-11-08 NOTE — Telephone Encounter (Signed)
-----   Message from Rana Snare, NP sent at 11/07/2022  4:26 PM EDT ----- Could you please follow-up with Dr. Andrey Campanile, orthopedics at [facility].  We had requested he review the CT scan/make recommendations.

## 2022-11-10 ENCOUNTER — Telehealth: Payer: Self-pay

## 2022-11-10 NOTE — Telephone Encounter (Signed)
I relayed the information to the patient that Dr. Andrey Campanile has indicated that no additional steps are required at this moment. Dr. Andrey Campanile will be sending the Ct scan to Dr. Andrena Mews for assessment and guidance. The patient expressed comprehension verbally and did not have any additional inquiries or issues.

## 2022-11-12 ENCOUNTER — Other Ambulatory Visit: Payer: Self-pay

## 2022-11-19 ENCOUNTER — Other Ambulatory Visit: Payer: Self-pay | Admitting: Oncology

## 2022-11-23 ENCOUNTER — Inpatient Hospital Stay: Payer: Medicare Other

## 2022-11-23 ENCOUNTER — Inpatient Hospital Stay (HOSPITAL_BASED_OUTPATIENT_CLINIC_OR_DEPARTMENT_OTHER): Payer: Medicare Other | Admitting: Oncology

## 2022-11-23 VITALS — BP 136/88 | HR 100 | Temp 98.1°F | Resp 18 | Ht 65.0 in | Wt 181.0 lb

## 2022-11-23 VITALS — BP 99/41 | HR 78 | Temp 98.2°F | Resp 18

## 2022-11-23 DIAGNOSIS — C3492 Malignant neoplasm of unspecified part of left bronchus or lung: Secondary | ICD-10-CM

## 2022-11-23 DIAGNOSIS — Z5112 Encounter for antineoplastic immunotherapy: Secondary | ICD-10-CM | POA: Diagnosis not present

## 2022-11-23 LAB — CMP (CANCER CENTER ONLY)
ALT: 15 U/L (ref 0–44)
AST: 18 U/L (ref 15–41)
Albumin: 4.1 g/dL (ref 3.5–5.0)
Alkaline Phosphatase: 64 U/L (ref 38–126)
Anion gap: 8 (ref 5–15)
BUN: 33 mg/dL — ABNORMAL HIGH (ref 8–23)
CO2: 26 mmol/L (ref 22–32)
Calcium: 9.8 mg/dL (ref 8.9–10.3)
Chloride: 103 mmol/L (ref 98–111)
Creatinine: 0.95 mg/dL (ref 0.44–1.00)
GFR, Estimated: 59 mL/min — ABNORMAL LOW (ref >60)
Glucose, Bld: 109 mg/dL — ABNORMAL HIGH (ref 70–99)
Potassium: 4.2 mmol/L (ref 3.5–5.1)
Sodium: 137 mmol/L (ref 135–145)
Total Bilirubin: 0.5 mg/dL (ref 0.3–1.2)
Total Protein: 6.5 g/dL (ref 6.5–8.1)

## 2022-11-23 LAB — CBC WITH DIFFERENTIAL (CANCER CENTER ONLY)
Abs Immature Granulocytes: 0.01 10*3/uL (ref 0.00–0.07)
Basophils Absolute: 0 10*3/uL (ref 0.0–0.1)
Basophils Relative: 0 %
Eosinophils Absolute: 0 10*3/uL (ref 0.0–0.5)
Eosinophils Relative: 1 %
HCT: 33.7 % — ABNORMAL LOW (ref 36.0–46.0)
Hemoglobin: 10.9 g/dL — ABNORMAL LOW (ref 12.0–15.0)
Immature Granulocytes: 0 %
Lymphocytes Relative: 12 %
Lymphs Abs: 0.7 10*3/uL (ref 0.7–4.0)
MCH: 30.3 pg (ref 26.0–34.0)
MCHC: 32.3 g/dL (ref 30.0–36.0)
MCV: 93.6 fL (ref 80.0–100.0)
Monocytes Absolute: 0.5 10*3/uL (ref 0.1–1.0)
Monocytes Relative: 9 %
Neutro Abs: 4.3 10*3/uL (ref 1.7–7.7)
Neutrophils Relative %: 78 %
Platelet Count: 163 10*3/uL (ref 150–400)
RBC: 3.6 MIL/uL — ABNORMAL LOW (ref 3.87–5.11)
RDW: 13.3 % (ref 11.5–15.5)
WBC Count: 5.5 10*3/uL (ref 4.0–10.5)
nRBC: 0 % (ref 0.0–0.2)

## 2022-11-23 MED ORDER — HYDROCODONE-ACETAMINOPHEN 10-325 MG PO TABS
1.0000 | ORAL_TABLET | ORAL | 0 refills | Status: DC | PRN
Start: 2022-11-23 — End: 2023-02-07

## 2022-11-23 MED ORDER — SODIUM CHLORIDE 0.9 % IV SOLN
200.0000 mg | Freq: Once | INTRAVENOUS | Status: AC
  Administered 2022-11-23: 200 mg via INTRAVENOUS
  Filled 2022-11-23: qty 8

## 2022-11-23 MED ORDER — SODIUM CHLORIDE 0.9 % IV SOLN: Freq: Once | INTRAVENOUS | Status: AC

## 2022-11-23 MED ORDER — HEPARIN SOD (PORK) LOCK FLUSH 100 UNIT/ML IV SOLN
500.0000 [IU] | Freq: Once | INTRAVENOUS | Status: AC | PRN
  Administered 2022-11-23: 500 [IU]

## 2022-11-23 MED ORDER — SODIUM CHLORIDE 0.9% FLUSH
10.0000 mL | INTRAVENOUS | Status: DC | PRN
  Administered 2022-11-23: 10 mL

## 2022-11-23 NOTE — Progress Notes (Signed)
[facility] OFFICE PROGRESS NOTE   Diagnosis: Non-small cell lung cancer  INTERVAL HISTORY:   [name] completed another cycle of pembrolizumab on 11/02/2022.  She tolerated the treatment well.  She continues to have pain at the "hip ".  The pain is worse with any pressure at the left hip.  She takes hydrocodone as needed.  Hydrocodone helps. She was seen at [facility] on 11/21/2022.  Dr. Andrey Campanile does not recommend further orthopedic intervention.  A small nondisplaced stress fracture was noted at the superior acetabular dome.  Objective:  Vital signs in last 24 hours:  Blood pressure 136/88, pulse 100, temperature 98.1 F (36.7 C), temperature source Oral, resp. rate 18, height  (1.651 m), weight 181 lb (82.1 kg), SpO2 98 %.    Resp: Lungs clear bilaterally Cardio: Regular rate and rhythm GI: No hepatosplenomegaly Vascular: No leg edema Musculoskeletal: Pain with palpation at the left trochanter and motion at the left hip  Portacath/PICC-without erythema  Lab Results:  Lab Results  Component Value Date   WBC 5.5 11/23/2022   HGB 10.9 (L) 11/23/2022   HCT 33.7 (L) 11/23/2022   MCV 93.6 11/23/2022   PLT 163 11/23/2022   NEUTROABS 4.3 11/23/2022    CMP  Lab Results  Component Value Date   NA 137 11/23/2022   K 4.2 11/23/2022   CL 103 11/23/2022   CO2 26 11/23/2022   GLUCOSE 109 (H) 11/23/2022   BUN 33 (H) 11/23/2022   CREATININE 0.95 11/23/2022   CALCIUM 9.8 11/23/2022   PROT 6.5 11/23/2022   ALBUMIN 4.1 11/23/2022   AST 18 11/23/2022   ALT 15 11/23/2022   ALKPHOS 64 11/23/2022   BILITOT 0.5 11/23/2022   GFRNONAA 59 (L) 11/23/2022   GFRAA >60 04/21/2020    Medications: I have reviewed the patient's current medications.   Assessment/Plan: Non-small cell lung cancer MRI lumbar spine 04/29/2019- enlarging marrow lesions involving the L1 vertebral body, upper left sacrum and right iliac bone MRI pelvis 04/29/2019- 3.5 cm left iliac bone  lesion appears slightly larger; other similar appearing lesions present within the left superior pubic ramus, left superior abdomen acetabulum and upper left sacrum Kappa free light chains with mild elevation 05/12/2019  CTs 05/12/2019- left lower lobe pulmonary mass 3.3 x 3.2 cm; lytic process left iliac bone; spinal lesions; 1.1 cm low-density left kidney lesion; right thyroid enlargement with heterogeneous appearance with potential for multiple discrete lesions Biopsy left lower lobe lung mass 05/26/2019-poorly differentiated carcinoma; positive for cytokeratin 5/6, p63 and TTF-1, no EGFR, BRAF, ALK, ERBB2,ROS, or NTRK alteration Cycle 1 carboplatin/Alimta/pembrolizumab 06/06/2019 Cycle 2 carboplatin/Alimta/pembrolizumab 06/27/2019 Cycle 3 carboplatin/Alimta/pembrolizumab 07/18/2019 Cycle 4 carboplatin/Alimta/pembrolizumab 08/07/2019 CTs 08/27/2019-significant decrease in size of lobulated mass left lower lobe.  Unchanged appearance of subtle bone lesions. Cycle 5 Alimta/pembrolizumab 08/28/2019 Cycle 6 Alimta/pembrolizumab 09/18/2019 Cycle 7 Alimta/pembrolizumab 10/09/2019 Cycle 8 Alimta/pembrolizumab 10/30/2019 Cycle 9 Alimta/pembrolizumab 11/20/2019 CTs 12/09/2019-no evidence of disease progression, left lower lobe nodule slightly decreased in size, stable L1, left sacral, and left pubic ramus metastases Cycle 10 Alimta/pembrolizumab 12/11/2019 Cycle 11 pembrolizumab alone 01/02/2020 (Alimta held due to edema, tenderness, erythema at the lower legs) Cycle 12 pembrolizumab 01/23/2020 Cycle 13 pembrolizumab 02/12/2020 Cycle 14 pembrolizumab 03/04/2020 CTs 03/18/2020-stable left lower lobe lesion, mild sclerosis at the superior endplate of L1 that was previously hypermetabolic, stable small left upper sacral lucent lesion, previous left superior pubic ramus lesion is occult on the CT, CT head negative for malignancy Cycle 15 pembrolizumab 03/25/2020 Cycle 16  pembrolizumab 04/21/2020 Cycle 17 pembrolizumab  05/17/2020 05/19/2020 bone scan-no definite abnormalities to suggest osseous metastases.  Areas of concern on prior PET-CT involving left iliac bone and L1 vertebral body showed no abnormalities on the current study Cycle 18 Pembrolizumab 06/10/2020 Cycle 19 Pembrolizumab 06/30/2020 CTs 07/16/2020-stable left lower lobe nodule, stable faint superior L1 vertebral lesion, no evidence of disease progression Cycle 20 pembrolizumab 07/21/2020 Cycle 21 Pembrolizumab 08/11/2020 Cycle 22 pembrolizumab 09/01/2020 Cycle 23 Pembrolizumab 09/22/2020 Cycle 24 pembrolizumab 10/13/2020 Cycle 25 Pembrolizumab 11/03/2020 Cycle 26 pembrolizumab 11/26/2020 CTs 12/15/2020- stable left lower lobe mass, stable sclerotic lesion at L2, no evidence of disease progression Cycle 27 pembrolizumab 12/17/2020 PET scan 01/03/2021-1.8 x 1.3 cm left lower lobe nodule similar in size to CT of 12/15/2020 and measures smaller than previous PET/CT from 2020.  Nodule is markedly hypermetabolic.  No evidence for hypermetabolic hilar or mediastinal lymphadenopathy.  Several tiny foci of hypermetabolism identified in bony anatomy raising concern for skeletal metastases.  Comparison of the PET to CT 07/16/2020-lobular left lower lobe pulmonary nodule measured 2.4 x 1.5 cm on the prior study, current study it measured 1.8 x 1.3 cm.  Lesion in the anterior left acetabulum is similar to the 07/16/2020 exam although overlying cortical thinning slightly more pronounced on the current study.  Described lesion in the scapula shows some cortical sclerosis and a tiny central marrow lucency not substantially changed compared to 07/16/2020.  Left third rib lesion shows heterogeneous mineralization similar to 07/16/2020. MRI of cervical spine 01/13/2021-increased left facet edema at C5-C6, severe facet arthrosis on the left at C7-T1 and on the right at C3-C4, no evidence of metastatic disease Cycle 28 Pembrolizumab 01/14/2021 Cycle 29 Pembrolizumab 02/03/2021 Cycle 30  Pembrolizumab 02/24/2021 MRI left hip 03/02/2021-compared to 04/29/2019, slight increase in size of metastases at the left iliac and left acetabulum.  Lesion at the left upper sacrum is less conspicuous and a lesion at the left superior pubic ramus is stable Cycle 31 pembrolizumab 03/17/2021 Radiation to left acetabulum and left iliac 03/30/2021-04/13/2021 Cycle 32 pembrolizumab 04/07/2021 Cycle 33 Pembrolizumab 04/28/2021 Cycle 34 pembrolizumab 05/18/2021 PET scan 06/01/2021-no significant change in size or degree of FDG uptake associated with FDG avid left lower lobe lung nodule compatible with neoplasm.  Stable to improved appearance of multifocal FDG avid bone metastasis. Cycle 35 pembrolizumab 06/10/2021 Cycle 36 pembrolizumab 07/01/2021 Cycle 37 pembrolizumab 07/29/2021 Cycle 38 pembrolizumab 08/19/2021 Cycle 39 Pembrolizumab 09/09/2021 Cycle 40 pembrolizumab 09/30/2021 CT chest 10/20/2021 to evaluate complaints of chest pain-no PE.  Interval increase in left lower lobe mass. Cycle 41 Pembrolizumab 10/21/2021 Cycle 42 pembrolizumab 11/11/2021 PET 11/24/2021-mild increase in size of left lower lobe mass, no evidence of solid organ or nodal metastases, stable multifocal bone metastases.  No new sites of metastatic disease. Cycle 43 pembrolizumab 11/29/2021 Cycle 45 Pembrolizumab 12/20/2021 Cycle 46 Pembrolizumab 01/09/2022 Cycle 47 pembrolizumab 02/01/2022 Cycle 48 Pembrolizumab 02/24/2022 Cycle 49 Pembrolizumab 03/17/2022 Cycle 50 pembrolizumab 04/07/2022 MRI l pelvis 04/12/2022-"new "lesion at the left superior acetabulum, stable bone lesions at the left suprapubic ramus, left S1, and left iliac Cycle 51 pembrolizumab 04/28/2022 Palliative radiation to the left hip and left scapula 04/26/2022-05/09/2022 Cycle 52 pembrolizumab 05/18/2022 PET 06/02/2022-stable left lower lobe tumor, slight increase in hypermetabolism associated with the left acetabular metastasis, additional previously seen bone lesions no longer  have hypermetabolism Cycle 53 pembrolizumab 06/08/2022 Cycle 54 pembrolizumab 06/29/2022 Cycle 55 pembrolizumab 07/20/2022 Cycle 56 pembrolizumab 08/10/2022 Cycle 57 pembrolizumab 08/31/2022 Cycle 58 pembrolizumab 09/21/2022 Cycle 59 pembrolizumab 10/12/2022 Cycle 60 Pembrolizumab 11/02/2022  Cycle 61 pembrolizumab 11/23/2022 Pain secondary to metastatic lung cancer Chronic back pain Type 2 diabetes Essential hypertension CAD Hyperlipidemia Family history significant for multiple members with breast cancer Grade 1 skin rash 07/18/2019 likely related to immunotherapy.  Topical steroid cream as needed. E. coli urinary tract infection 07/14/2019.  Completed cephalexin. Edema/tenderness at the right greater than left ankle 10/21/2019-etiology unclear, potentially related to systemic therapy or an infection, doxycycline prescribed-improved 10/23/2019; marked improvement 11/20/2019; at office visit 01/02/2020 she reports worsening of lower extremity edema, pain/tenderness, erythema 3 to 4 days following each treatment.  Alimta held 01/02/2020.  Referral to dermatology. COVID-19 infection 03/30/2020, monoclonal antibody therapy 04/06/2020 Arthritis, possibly related to Pembrolizumab, improved since beginning Medrol, now followed by Dr. Dierdre Forth Pain left femoral head/upper femur/groin-negative plain x-ray; CT pelvis 03/28/2022-no acute appearing bone abnormality at the left hip or involving the proximal left femur, no evidence of acute fracture or dislocation, soft tissues left hip and left groin unremarkable; osseous mets within the left sacrum, left iliac bone and anterior acetabulum without evidence of associated fracture or dislocation. Percutaneous fixation of left pathologic acetabular fracture 08/23/2022 CT pelvis and left hip 11/02/2022-subacute nondisplaced fracture through the supra-acetabular left ilium extending through the acetabular roof, placement of 2 screws within the left ilium extending to the  supra-acetabular region with an additional screw traversing the left superior pubic ramus, subtle sclerosis in the left ilium and superior left acetabulum, no new site of metastatic disease.      Disposition: [name] appears unchanged.  She has metastatic non-small cell lung cancer.  She has metastatic disease involving the pelvis.  She has a history of a acetabular fracture treated with percutaneous fixation.  There is a subacute nondisplaced fracture at the supra-acetabular left ilium.  I suspect her pain is related to the acetabular fractures and metastatic disease.  She received palliative radiation to the left hip last year.  I discussed pain management options with [name].  She declines a long-acting narcotic at present.  She will continue hydrocodone for pain. The plan is to continue pembrolizumab.  She will be referred for a restaging PET in early June.  She declines a referral to palliative care.    Thornton Papas, MD  11/23/2022  12:44 PM

## 2022-11-23 NOTE — Progress Notes (Signed)
Patient seen by Dr. Sherrill today ? ?Vitals are within treatment parameters. ? ?Labs reviewed by Dr. Sherrill and are within treatment parameters. ? ?Per physician team, patient is ready for treatment and there are NO modifications to the treatment plan.  ?

## 2022-11-23 NOTE — Addendum Note (Signed)
Addended by: Thornton Papas B on: 11/23/2022 01:35 PM   Modules accepted: Orders

## 2022-11-23 NOTE — Patient Instructions (Signed)
[facility]   Discharge Instructions: Thank you for choosing  [facility] to provide your oncology and hematology care.   If you have a lab appointment with the Cancer Center, please go directly to the Cancer Center and check in at the registration area.   Wear comfortable clothing and clothing appropriate for easy access to any Portacath or PICC line.   We strive to give you quality time with your provider. You may need to reschedule your appointment if you arrive late (15 or more minutes).  Arriving late affects you and other patients whose appointments are after yours.  Also, if you miss three or more appointments without notifying the office, you may be dismissed from the clinic at the provider's discretion.      For prescription refill requests, have your pharmacy contact our office and allow 72 hours for refills to be completed.    Today you received the following chemotherapy and/or immunotherapy agents Keytruda.      To help prevent nausea and vomiting after your treatment, we encourage you to take your nausea medication as directed.  BELOW ARE SYMPTOMS THAT SHOULD BE REPORTED IMMEDIATELY: *FEVER GREATER THAN 100.4 F (38 C) OR HIGHER *CHILLS OR SWEATING *NAUSEA AND VOMITING THAT IS NOT CONTROLLED WITH YOUR NAUSEA MEDICATION *UNUSUAL SHORTNESS OF BREATH *UNUSUAL BRUISING OR BLEEDING *URINARY PROBLEMS (pain or burning when urinating, or frequent urination) *BOWEL PROBLEMS (unusual diarrhea, constipation, pain near the anus) TENDERNESS IN MOUTH AND THROAT WITH OR WITHOUT PRESENCE OF ULCERS (sore throat, sores in mouth, or a toothache) UNUSUAL RASH, SWELLING OR PAIN  UNUSUAL VAGINAL DISCHARGE OR ITCHING   Items with * indicate a potential emergency and should be followed up as soon as possible or go to the Emergency Department if any problems should occur.  Please show the CHEMOTHERAPY ALERT CARD or IMMUNOTHERAPY ALERT CARD at  check-in to the Emergency Department and triage nurse.  Should you have questions after your visit or need to cancel or reschedule your appointment, please contact [facility]  Dept: [phone]  and follow the prompts.  Office hours are 8:00 a.m. to 4:30 p.m. Monday - Friday. Please note that voicemails left after 4:00 p.m. may not be returned until the following business day.  We are closed weekends and major holidays. You have access to a nurse at all times for urgent questions. Please call the main number to the clinic Dept: [phone] and follow the prompts.   For any non-urgent questions, you may also contact your provider using MyChart. We now offer e-Visits for anyone 18 and older to request care online for non-urgent symptoms. For details visit [email or web address].   Also download the MyChart app! Go to the app store, search "MyChart", open the app, select [facility] [place], and log in with your MyChart username and password.  Pembrolizumab Injection What is this medication? PEMBROLIZUMAB (PEM broe LIZ ue mab) treats some types of cancer. It works by helping your immune system slow or stop the spread of cancer cells. It is a monoclonal antibody. This medicine may be used for other purposes; ask your health care provider or pharmacist if you have questions. COMMON BRAND NAME(S): Keytruda What should I tell my care team before I take this medication? They need to know if you have any of these conditions: Allogeneic stem cell transplant (uses someone else's stem cells) Autoimmune diseases, such as Crohn disease, ulcerative colitis, lupus History of chest radiation Nervous system   problems, such as Guillain-Barre syndrome, myasthenia gravis Organ transplant An unusual or allergic reaction to pembrolizumab, other medications, foods, dyes, or preservatives Pregnant or trying to get pregnant Breast-feeding How should I use this medication? This medication  is injected into a vein. It is given by your care team in a hospital or clinic setting. A special MedGuide will be given to you before each treatment. Be sure to read this information carefully each time. Talk to your care team about the use of this medication in children. While it may be prescribed for children as young as 6 months for selected conditions, precautions do apply. Overdosage: If you think you have taken too much of this medicine contact a poison control center or emergency room at once. NOTE: This medicine is only for you. Do not share this medicine with others. What if I miss a dose? Keep appointments for follow-up doses. It is important not to miss your dose. Call your care team if you are unable to keep an appointment. What may interact with this medication? Interactions have not been studied. This list may not describe all possible interactions. Give your health care provider a list of all the medicines, herbs, non-prescription drugs, or dietary supplements you use. Also tell them if you smoke, drink alcohol, or use illegal drugs. Some items may interact with your medicine. What should I watch for while using this medication? Your condition will be monitored carefully while you are receiving this medication. You may need blood work while taking this medication. This medication may cause serious skin reactions. They can happen weeks to months after starting the medication. Contact your care team right away if you notice fevers or flu-like symptoms with a rash. The rash may be red or purple and then turn into blisters or peeling of the skin. You may also notice a red rash with swelling of the face, lips, or lymph nodes in your neck or under your arms. Tell your care team right away if you have any change in your eyesight. Talk to your care team if you may be pregnant. Serious birth defects can occur if you take this medication during pregnancy and for 4 months after the last dose. You  will need a negative pregnancy test before starting this medication. Contraception is recommended while taking this medication and for 4 months after the last dose. Your care team can help you find the option that works for you. Do not breastfeed while taking this medication and for 4 months after the last dose. What side effects may I notice from receiving this medication? Side effects that you should report to your care team as soon as possible: Allergic reactions--skin rash, itching, hives, swelling of the face, lips, tongue, or throat Dry cough, shortness of breath or trouble breathing Eye pain, redness, irritation, or discharge with blurry or decreased vision Heart muscle inflammation--unusual weakness or fatigue, shortness of breath, chest pain, fast or irregular heartbeat, dizziness, swelling of the ankles, feet, or hands Hormone gland problems--headache, sensitivity to light, unusual weakness or fatigue, dizziness, fast or irregular heartbeat, increased sensitivity to cold or heat, excessive sweating, constipation, hair loss, increased thirst or amount of urine, tremors or shaking, irritability Infusion reactions--chest pain, shortness of breath or trouble breathing, feeling faint or lightheaded Kidney injury (glomerulonephritis)--decrease in the amount of urine, red or dark brown urine, foamy or bubbly urine, swelling of the ankles, hands, or feet Liver injury--right upper belly pain, loss of appetite, nausea, light-colored stool, dark yellow or brown   urine, yellowing skin or eyes, unusual weakness or fatigue Pain, tingling, or numbness in the hands or feet, muscle weakness, change in vision, confusion or trouble speaking, loss of balance or coordination, trouble walking, seizures Rash, fever, and swollen lymph nodes Redness, blistering, peeling, or loosening of the skin, including inside the mouth Sudden or severe stomach pain, bloody diarrhea, fever, nausea, vomiting Side effects that  usually do not require medical attention (report to your care team if they continue or are bothersome): Bone, joint, or muscle pain Diarrhea Fatigue Loss of appetite Nausea Skin rash This list may not describe all possible side effects. Call your doctor for medical advice about side effects. You may report side effects to FDA at 1-800-FDA-1088. Where should I keep my medication? This medication is given in a hospital or clinic. It will not be stored at home. NOTE: This sheet is a summary. It may not cover all possible information. If you have questions about this medicine, talk to your doctor, pharmacist, or health care provider.  2023 Elsevier/Gold Standard (2021-11-29 00:00:00)   

## 2022-11-24 ENCOUNTER — Other Ambulatory Visit: Payer: Self-pay

## 2022-12-01 ENCOUNTER — Encounter: Payer: Self-pay | Admitting: Oncology

## 2022-12-04 ENCOUNTER — Other Ambulatory Visit: Payer: Self-pay

## 2022-12-05 ENCOUNTER — Encounter: Payer: Self-pay | Admitting: Oncology

## 2022-12-11 ENCOUNTER — Telehealth: Payer: Self-pay | Admitting: *Deleted

## 2022-12-11 NOTE — Telephone Encounter (Signed)
Left VM that PET is at [facility] on 5/31 at 12:15. Water only for 6 hours prior. Also sent on Mychart.

## 2022-12-14 ENCOUNTER — Inpatient Hospital Stay: Payer: Medicare Other

## 2022-12-14 ENCOUNTER — Inpatient Hospital Stay: Payer: Medicare Other | Admitting: Nurse Practitioner

## 2022-12-14 ENCOUNTER — Encounter: Payer: Self-pay | Admitting: Oncology

## 2022-12-15 ENCOUNTER — Other Ambulatory Visit: Payer: Self-pay

## 2022-12-21 ENCOUNTER — Inpatient Hospital Stay: Payer: Medicare Other | Attending: Oncology

## 2022-12-21 ENCOUNTER — Inpatient Hospital Stay (HOSPITAL_BASED_OUTPATIENT_CLINIC_OR_DEPARTMENT_OTHER): Payer: Medicare Other | Admitting: Nurse Practitioner

## 2022-12-21 ENCOUNTER — Inpatient Hospital Stay: Payer: Medicare Other

## 2022-12-21 ENCOUNTER — Encounter: Payer: Self-pay | Admitting: Nurse Practitioner

## 2022-12-21 VITALS — BP 141/64 | HR 50 | Temp 98.2°F | Resp 18 | Ht 65.0 in | Wt 181.0 lb

## 2022-12-21 VITALS — BP 121/50 | HR 53

## 2022-12-21 DIAGNOSIS — C3492 Malignant neoplasm of unspecified part of left bronchus or lung: Secondary | ICD-10-CM

## 2022-12-21 DIAGNOSIS — Z5112 Encounter for antineoplastic immunotherapy: Secondary | ICD-10-CM | POA: Diagnosis present

## 2022-12-21 DIAGNOSIS — C7951 Secondary malignant neoplasm of bone: Secondary | ICD-10-CM | POA: Insufficient documentation

## 2022-12-21 DIAGNOSIS — C3432 Malignant neoplasm of lower lobe, left bronchus or lung: Secondary | ICD-10-CM | POA: Diagnosis present

## 2022-12-21 LAB — CBC WITH DIFFERENTIAL (CANCER CENTER ONLY)
Abs Immature Granulocytes: 0.01 10*3/uL (ref 0.00–0.07)
Basophils Absolute: 0 10*3/uL (ref 0.0–0.1)
Basophils Relative: 1 %
Eosinophils Absolute: 0.1 10*3/uL (ref 0.0–0.5)
Eosinophils Relative: 3 %
HCT: 32.4 % — ABNORMAL LOW (ref 36.0–46.0)
Hemoglobin: 10.6 g/dL — ABNORMAL LOW (ref 12.0–15.0)
Immature Granulocytes: 0 %
Lymphocytes Relative: 12 %
Lymphs Abs: 0.5 10*3/uL — ABNORMAL LOW (ref 0.7–4.0)
MCH: 30 pg (ref 26.0–34.0)
MCHC: 32.7 g/dL (ref 30.0–36.0)
MCV: 91.8 fL (ref 80.0–100.0)
Monocytes Absolute: 0.3 10*3/uL (ref 0.1–1.0)
Monocytes Relative: 8 %
Neutro Abs: 3.2 10*3/uL (ref 1.7–7.7)
Neutrophils Relative %: 76 %
Platelet Count: 145 10*3/uL — ABNORMAL LOW (ref 150–400)
RBC: 3.53 MIL/uL — ABNORMAL LOW (ref 3.87–5.11)
RDW: 13.6 % (ref 11.5–15.5)
WBC Count: 4.2 10*3/uL (ref 4.0–10.5)
nRBC: 0 % (ref 0.0–0.2)

## 2022-12-21 LAB — CMP (CANCER CENTER ONLY)
ALT: 13 U/L (ref 0–44)
AST: 17 U/L (ref 15–41)
Albumin: 4.3 g/dL (ref 3.5–5.0)
Alkaline Phosphatase: 60 U/L (ref 38–126)
Anion gap: 8 (ref 5–15)
BUN: 23 mg/dL (ref 8–23)
CO2: 26 mmol/L (ref 22–32)
Calcium: 9.8 mg/dL (ref 8.9–10.3)
Chloride: 104 mmol/L (ref 98–111)
Creatinine: 0.84 mg/dL (ref 0.44–1.00)
GFR, Estimated: >60 mL/min (ref >60)
Glucose, Bld: 102 mg/dL — ABNORMAL HIGH (ref 70–99)
Potassium: 4 mmol/L (ref 3.5–5.1)
Sodium: 138 mmol/L (ref 135–145)
Total Bilirubin: 0.4 mg/dL (ref 0.3–1.2)
Total Protein: 6.8 g/dL (ref 6.5–8.1)

## 2022-12-21 MED ORDER — SODIUM CHLORIDE 0.9 % IV SOLN: Freq: Once | INTRAVENOUS | Status: AC

## 2022-12-21 MED ORDER — SODIUM CHLORIDE 0.9% FLUSH
10.0000 mL | INTRAVENOUS | Status: DC | PRN
  Administered 2022-12-21: 10 mL

## 2022-12-21 MED ORDER — HEPARIN SOD (PORK) LOCK FLUSH 100 UNIT/ML IV SOLN
500.0000 [IU] | Freq: Once | INTRAVENOUS | Status: AC | PRN
  Administered 2022-12-21: 500 [IU]

## 2022-12-21 MED ORDER — FENTANYL 12 MCG/HR TD PT72
1.0000 | MEDICATED_PATCH | TRANSDERMAL | 0 refills | Status: DC
Start: 2022-12-21 — End: 2023-01-04

## 2022-12-21 MED ORDER — SODIUM CHLORIDE 0.9 % IV SOLN
200.0000 mg | Freq: Once | INTRAVENOUS | Status: AC
  Administered 2022-12-21: 200 mg via INTRAVENOUS
  Filled 2022-12-21: qty 8

## 2022-12-21 NOTE — Progress Notes (Signed)
[facility] OFFICE PROGRESS NOTE   Diagnosis:  Non-small cell lung cancer  INTERVAL HISTORY:   [name] returns as scheduled.  She completed another cycle of Pembrolizumab 11/23/2022.  She denies nausea/vomiting.  No diarrhea.  No rash.  She continues to have pain at the left hip.  She is taking one half hydrocodone tablet every 2 hours.  She wonders if she could try a long-acting pain medication.  Objective:  Vital signs in last 24 hours:  Blood pressure (!) 141/64, pulse (!) 50, temperature 98.2 F (36.8 C), temperature source Oral, resp. rate 18, height 5\' 5"  (1.651 m), weight 181 lb (82.1 kg), SpO2 98 %.     Resp: Lungs clear bilaterally. Cardio: Regular rate and rhythm. GI: No hepatosplenomegaly. Vascular: Trace lower leg edema bilaterally. Neuro: Alert and oriented. Skin: No rash. Port-A-Cath without erythema.  Lab Results:  Lab Results  Component Value Date   WBC 4.2 12/21/2022   HGB 10.6 (L) 12/21/2022   HCT 32.4 (L) 12/21/2022   MCV 91.8 12/21/2022   PLT 145 (L) 12/21/2022   NEUTROABS 3.2 12/21/2022    Imaging:  No results found.  Medications: I have reviewed the patient's current medications.  Assessment/Plan: Non-small cell lung cancer MRI lumbar spine 04/29/2019- enlarging marrow lesions involving the L1 vertebral body, upper left sacrum and right iliac bone MRI pelvis 04/29/2019- 3.5 cm left iliac bone lesion appears slightly larger; other similar appearing lesions present within the left superior pubic ramus, left superior abdomen acetabulum and upper left sacrum Kappa free light chains with mild elevation 05/12/2019  CTs 05/12/2019- left lower lobe pulmonary mass 3.3 x 3.2 cm; lytic process left iliac bone; spinal lesions; 1.1 cm low-density left kidney lesion; right thyroid enlargement with heterogeneous appearance with potential for multiple discrete lesions Biopsy left lower lobe lung mass 05/26/2019-poorly differentiated  carcinoma; positive for cytokeratin 5/6, p63 and TTF-1, no EGFR, BRAF, ALK, ERBB2,ROS, or NTRK alteration Cycle 1 carboplatin/Alimta/pembrolizumab 06/06/2019 Cycle 2 carboplatin/Alimta/pembrolizumab 06/27/2019 Cycle 3 carboplatin/Alimta/pembrolizumab 07/18/2019 Cycle 4 carboplatin/Alimta/pembrolizumab 08/07/2019 CTs 08/27/2019-significant decrease in size of lobulated mass left lower lobe.  Unchanged appearance of subtle bone lesions. Cycle 5 Alimta/pembrolizumab 08/28/2019 Cycle 6 Alimta/pembrolizumab 09/18/2019 Cycle 7 Alimta/pembrolizumab 10/09/2019 Cycle 8 Alimta/pembrolizumab 10/30/2019 Cycle 9 Alimta/pembrolizumab 11/20/2019 CTs 12/09/2019-no evidence of disease progression, left lower lobe nodule slightly decreased in size, stable L1, left sacral, and left pubic ramus metastases Cycle 10 Alimta/pembrolizumab 12/11/2019 Cycle 11 pembrolizumab alone 01/02/2020 (Alimta held due to edema, tenderness, erythema at the lower legs) Cycle 12 pembrolizumab 01/23/2020 Cycle 13 pembrolizumab 02/12/2020 Cycle 14 pembrolizumab 03/04/2020 CTs 03/18/2020-stable left lower lobe lesion, mild sclerosis at the superior endplate of L1 that was previously hypermetabolic, stable small left upper sacral lucent lesion, previous left superior pubic ramus lesion is occult on the CT, CT head negative for malignancy Cycle 15 pembrolizumab 03/25/2020 Cycle 16 pembrolizumab 04/21/2020 Cycle 17 pembrolizumab 05/17/2020 05/19/2020 bone scan-no definite abnormalities to suggest osseous metastases.  Areas of concern on prior PET-CT involving left iliac bone and L1 vertebral body showed no abnormalities on the current study Cycle 18 Pembrolizumab 06/10/2020 Cycle 19 Pembrolizumab 06/30/2020 CTs 07/16/2020-stable left lower lobe nodule, stable faint superior L1 vertebral lesion, no evidence of disease progression Cycle 20 pembrolizumab 07/21/2020 Cycle 21 Pembrolizumab 08/11/2020 Cycle 22 pembrolizumab 09/01/2020 Cycle 23 Pembrolizumab  09/22/2020 Cycle 24 pembrolizumab 10/13/2020 Cycle 25 Pembrolizumab 11/03/2020 Cycle 26 pembrolizumab 11/26/2020 CTs 12/15/2020- stable left lower lobe mass, stable sclerotic lesion at L2, no evidence of disease progression Cycle 27  pembrolizumab 12/17/2020 PET scan 01/03/2021-1.8 x 1.3 cm left lower lobe nodule similar in size to CT of 12/15/2020 and measures smaller than previous PET/CT from 2020.  Nodule is markedly hypermetabolic.  No evidence for hypermetabolic hilar or mediastinal lymphadenopathy.  Several tiny foci of hypermetabolism identified in bony anatomy raising concern for skeletal metastases.  Comparison of the PET to CT 07/16/2020-lobular left lower lobe pulmonary nodule measured 2.4 x 1.5 cm on the prior study, current study it measured 1.8 x 1.3 cm.  Lesion in the anterior left acetabulum is similar to the 07/16/2020 exam although overlying cortical thinning slightly more pronounced on the current study.  Described lesion in the scapula shows some cortical sclerosis and a tiny central marrow lucency not substantially changed compared to 07/16/2020.  Left third rib lesion shows heterogeneous mineralization similar to 07/16/2020. MRI of cervical spine 01/13/2021-increased left facet edema at C5-C6, severe facet arthrosis on the left at C7-T1 and on the right at C3-C4, no evidence of metastatic disease Cycle 28 Pembrolizumab 01/14/2021 Cycle 29 Pembrolizumab 02/03/2021 Cycle 30 Pembrolizumab 02/24/2021 MRI left hip 03/02/2021-compared to 04/29/2019, slight increase in size of metastases at the left iliac and left acetabulum.  Lesion at the left upper sacrum is less conspicuous and a lesion at the left superior pubic ramus is stable Cycle 31 pembrolizumab 03/17/2021 Radiation to left acetabulum and left iliac 03/30/2021-04/13/2021 Cycle 32 pembrolizumab 04/07/2021 Cycle 33 Pembrolizumab 04/28/2021 Cycle 34 pembrolizumab 05/18/2021 PET scan 06/01/2021-no significant change in size or degree of FDG uptake  associated with FDG avid left lower lobe lung nodule compatible with neoplasm.  Stable to improved appearance of multifocal FDG avid bone metastasis. Cycle 35 pembrolizumab 06/10/2021 Cycle 36 pembrolizumab 07/01/2021 Cycle 37 pembrolizumab 07/29/2021 Cycle 38 pembrolizumab 08/19/2021 Cycle 39 Pembrolizumab 09/09/2021 Cycle 40 pembrolizumab 09/30/2021 CT chest 10/20/2021 to evaluate complaints of chest pain-no PE.  Interval increase in left lower lobe mass. Cycle 41 Pembrolizumab 10/21/2021 Cycle 42 pembrolizumab 11/11/2021 PET 11/24/2021-mild increase in size of left lower lobe mass, no evidence of solid organ or nodal metastases, stable multifocal bone metastases.  No new sites of metastatic disease. Cycle 43 pembrolizumab 11/29/2021 Cycle 45 Pembrolizumab 12/20/2021 Cycle 46 Pembrolizumab 01/09/2022 Cycle 47 pembrolizumab 02/01/2022 Cycle 48 Pembrolizumab 02/24/2022 Cycle 49 Pembrolizumab 03/17/2022 Cycle 50 pembrolizumab 04/07/2022 MRI l pelvis 04/12/2022-"new "lesion at the left superior acetabulum, stable bone lesions at the left suprapubic ramus, left S1, and left iliac Cycle 51 pembrolizumab 04/28/2022 Palliative radiation to the left hip and left scapula 04/26/2022-05/09/2022 Cycle 52 pembrolizumab 05/18/2022 PET 06/02/2022-stable left lower lobe tumor, slight increase in hypermetabolism associated with the left acetabular metastasis, additional previously seen bone lesions no longer have hypermetabolism Cycle 53 pembrolizumab 06/08/2022 Cycle 54 pembrolizumab 06/29/2022 Cycle 55 pembrolizumab 07/20/2022 Cycle 56 pembrolizumab 08/10/2022 Cycle 57 pembrolizumab 08/31/2022 Cycle 58 pembrolizumab 09/21/2022 Cycle 59 pembrolizumab 10/12/2022 Cycle 60 Pembrolizumab 11/02/2022 Cycle 61 pembrolizumab 11/23/2022 Cycle 62 Pembrolizumab 12/21/2022 Pain secondary to metastatic lung cancer Chronic back pain Type 2 diabetes Essential hypertension CAD Hyperlipidemia Family history significant for multiple members  with breast cancer Grade 1 skin rash 07/18/2019 likely related to immunotherapy.  Topical steroid cream as needed. E. coli urinary tract infection 07/14/2019.  Completed cephalexin. Edema/tenderness at the right greater than left ankle 10/21/2019-etiology unclear, potentially related to systemic therapy or an infection, doxycycline prescribed-improved 10/23/2019; marked improvement 11/20/2019; at office visit 01/02/2020 she reports worsening of lower extremity edema, pain/tenderness, erythema 3 to 4 days following each treatment.  Alimta held 01/02/2020.  Referral to dermatology. COVID-19  infection 03/30/2020, monoclonal antibody therapy 04/06/2020 Arthritis, possibly related to Pembrolizumab, improved since beginning Medrol, now followed by Dr. Dierdre Forth Pain left femoral head/upper femur/groin-negative plain x-ray; CT pelvis 03/28/2022-no acute appearing bone abnormality at the left hip or involving the proximal left femur, no evidence of acute fracture or dislocation, soft tissues left hip and left groin unremarkable; osseous mets within the left sacrum, left iliac bone and anterior acetabulum without evidence of associated fracture or dislocation. Percutaneous fixation of left pathologic acetabular fracture 08/23/2022 CT pelvis and left hip 11/02/2022-subacute nondisplaced fracture through the supra-acetabular left ilium extending through the acetabular roof, placement of 2 screws within the left ilium extending to the supra-acetabular region with an additional screw traversing the left superior pubic ramus, subtle sclerosis in the left ilium and superior left acetabulum, no new site of metastatic disease.      Disposition: [name] appears unchanged.  She is on active treatment with pembrolizumab every 3 weeks.  Plan to proceed with treatment today as scheduled.  Restaging PET scan prior to next office visit.  CBC and chemistry panel reviewed.  Labs adequate to proceed as above.  We discussed a long-acting  pain medication.  She will begin a Duragesic 12 mcg patch.  She will return for follow-up as scheduled in 3 weeks.  Patient seen with Dr. Truett Perna.    Lonna Cobb ANP/GNP-BC   12/21/2022  10:45 AM This was a shared visit with Lonna Cobb.  [name] continues treatment with pembrolizumab.  She will undergo a restaging PET scan after this cycle.  We adjusted the narcotic pain regimen today.  She will contact us if her pain is not improved with the Duragesic patch.  I was present for greater than 50% of today's visit.  I performed medical decision making.  Mancel Bale, MD

## 2022-12-21 NOTE — Patient Instructions (Signed)
[facility]   Discharge Instructions: Thank you for choosing [facility] to provide your oncology and hematology care.   If you have a lab appointment with the Cancer Center, please go directly to the Cancer Center and check in at the registration area.   Wear comfortable clothing and clothing appropriate for easy access to any Portacath or PICC line.   We strive to give you quality time with your provider. You may need to reschedule your appointment if you arrive late (15 or more minutes).  Arriving late affects you and other patients whose appointments are after yours.  Also, if you miss three or more appointments without notifying the office, you may be dismissed from the clinic at the provider's discretion.      For prescription refill requests, have your pharmacy contact our office and allow 72 hours for refills to be completed.    Today you received the following chemotherapy and/or immunotherapy agents Keytruda.      To help prevent nausea and vomiting after your treatment, we encourage you to take your nausea medication as directed.  BELOW ARE SYMPTOMS THAT SHOULD BE REPORTED IMMEDIATELY: *FEVER GREATER THAN 100.4 F (38 C) OR HIGHER *CHILLS OR SWEATING *NAUSEA AND VOMITING THAT IS NOT CONTROLLED WITH YOUR NAUSEA MEDICATION *UNUSUAL SHORTNESS OF BREATH *UNUSUAL BRUISING OR BLEEDING *URINARY PROBLEMS (pain or burning when urinating, or frequent urination) *BOWEL PROBLEMS (unusual diarrhea, constipation, pain near the anus) TENDERNESS IN MOUTH AND THROAT WITH OR WITHOUT PRESENCE OF ULCERS (sore throat, sores in mouth, or a toothache) UNUSUAL RASH, SWELLING OR PAIN  UNUSUAL VAGINAL DISCHARGE OR ITCHING   Items with * indicate a potential emergency and should be followed up as soon as possible or go to the Emergency Department if any problems should occur.  Please show the CHEMOTHERAPY ALERT CARD or IMMUNOTHERAPY ALERT CARD at  check-in to the Emergency Department and triage nurse.  Should you have questions after your visit or need to cancel or reschedule your appointment, please contact [facility]  Dept: [phone]  and follow the prompts.  Office hours are 8:00 a.m. to 4:30 p.m. Monday - Friday. Please note that voicemails left after 4:00 p.m. may not be returned until the following business day.  We are closed weekends and major holidays. You have access to a nurse at all times for urgent questions. Please call the main number to the clinic Dept: [phone] and follow the prompts.   For any non-urgent questions, you may also contact your provider using MyChart. We now offer e-Visits for anyone 18 and older to request care online for non-urgent symptoms. For details visit [email or web address].   Also download the MyChart app! Go to the app store, search "MyChart", open the app, select , and log in with your MyChart username and password.  Pembrolizumab Injection What is this medication? PEMBROLIZUMAB (PEM broe LIZ ue mab) treats some types of cancer. It works by helping your immune system slow or stop the spread of cancer cells. It is a monoclonal antibody. This medicine may be used for other purposes; ask your health care provider or pharmacist if you have questions. COMMON BRAND NAME(S): Keytruda What should I tell my care team before I take this medication? They need to know if you have any of these conditions: Allogeneic stem cell transplant (uses someone else's stem cells) Autoimmune diseases, such as Crohn disease, ulcerative colitis, lupus History of chest radiation Nervous system   problems, such as Guillain-Barre syndrome, myasthenia gravis Organ transplant An unusual or allergic reaction to pembrolizumab, other medications, foods, dyes, or preservatives Pregnant or trying to get pregnant Breast-feeding How should I use this medication? This medication  is injected into a vein. It is given by your care team in a hospital or clinic setting. A special MedGuide will be given to you before each treatment. Be sure to read this information carefully each time. Talk to your care team about the use of this medication in children. While it may be prescribed for children as young as 6 months for selected conditions, precautions do apply. Overdosage: If you think you have taken too much of this medicine contact a poison control center or emergency room at once. NOTE: This medicine is only for you. Do not share this medicine with others. What if I miss a dose? Keep appointments for follow-up doses. It is important not to miss your dose. Call your care team if you are unable to keep an appointment. What may interact with this medication? Interactions have not been studied. This list may not describe all possible interactions. Give your health care provider a list of all the medicines, herbs, non-prescription drugs, or dietary supplements you use. Also tell them if you smoke, drink alcohol, or use illegal drugs. Some items may interact with your medicine. What should I watch for while using this medication? Your condition will be monitored carefully while you are receiving this medication. You may need blood work while taking this medication. This medication may cause serious skin reactions. They can happen weeks to months after starting the medication. Contact your care team right away if you notice fevers or flu-like symptoms with a rash. The rash may be red or purple and then turn into blisters or peeling of the skin. You may also notice a red rash with swelling of the face, lips, or lymph nodes in your neck or under your arms. Tell your care team right away if you have any change in your eyesight. Talk to your care team if you may be pregnant. Serious birth defects can occur if you take this medication during pregnancy and for 4 months after the last dose. You  will need a negative pregnancy test before starting this medication. Contraception is recommended while taking this medication and for 4 months after the last dose. Your care team can help you find the option that works for you. Do not breastfeed while taking this medication and for 4 months after the last dose. What side effects may I notice from receiving this medication? Side effects that you should report to your care team as soon as possible: Allergic reactions--skin rash, itching, hives, swelling of the face, lips, tongue, or throat Dry cough, shortness of breath or trouble breathing Eye pain, redness, irritation, or discharge with blurry or decreased vision Heart muscle inflammation--unusual weakness or fatigue, shortness of breath, chest pain, fast or irregular heartbeat, dizziness, swelling of the ankles, feet, or hands Hormone gland problems--headache, sensitivity to light, unusual weakness or fatigue, dizziness, fast or irregular heartbeat, increased sensitivity to cold or heat, excessive sweating, constipation, hair loss, increased thirst or amount of urine, tremors or shaking, irritability Infusion reactions--chest pain, shortness of breath or trouble breathing, feeling faint or lightheaded Kidney injury (glomerulonephritis)--decrease in the amount of urine, red or dark brown urine, foamy or bubbly urine, swelling of the ankles, hands, or feet Liver injury--right upper belly pain, loss of appetite, nausea, light-colored stool, dark yellow or brown   urine, yellowing skin or eyes, unusual weakness or fatigue Pain, tingling, or numbness in the hands or feet, muscle weakness, change in vision, confusion or trouble speaking, loss of balance or coordination, trouble walking, seizures Rash, fever, and swollen lymph nodes Redness, blistering, peeling, or loosening of the skin, including inside the mouth Sudden or severe stomach pain, bloody diarrhea, fever, nausea, vomiting Side effects that  usually do not require medical attention (report to your care team if they continue or are bothersome): Bone, joint, or muscle pain Diarrhea Fatigue Loss of appetite Nausea Skin rash This list may not describe all possible side effects. Call your doctor for medical advice about side effects. You may report side effects to FDA at 1-800-FDA-1088. Where should I keep my medication? This medication is given in a hospital or clinic. It will not be stored at home. NOTE: This sheet is a summary. It may not cover all possible information. If you have questions about this medicine, talk to your doctor, pharmacist, or health care provider.  2023 Elsevier/Gold Standard (2021-11-29 00:00:00)   

## 2022-12-21 NOTE — Progress Notes (Signed)
Patient seen by Lonna Cobb NP today  Vitals are not all within treatment parameters. Pulse 50 no intervention   Labs reviewed by Lonna Cobb NP and are within treatment parameters.  Per physician team, patient is ready for treatment and there are NO modifications to the treatment plan.

## 2022-12-21 NOTE — Patient Instructions (Signed)

## 2022-12-29 ENCOUNTER — Ambulatory Visit (HOSPITAL_COMMUNITY)
Admission: RE | Admit: 2022-12-29 | Discharge: 2022-12-29 | Disposition: A | Discharge status: Home / Self Care | Payer: Medicare Other | Source: Ambulatory Visit | Attending: Oncology | Admitting: Oncology

## 2022-12-29 DIAGNOSIS — C3492 Malignant neoplasm of unspecified part of left bronchus or lung: Secondary | ICD-10-CM | POA: Diagnosis not present

## 2022-12-29 LAB — GLUCOSE, CAPILLARY: Glucose-Capillary: 127 mg/dL — ABNORMAL HIGH (ref 70–99)

## 2022-12-29 MED ORDER — FLUDEOXYGLUCOSE F - 18 (FDG) INJECTION
8.8900 | Freq: Once | INTRAVENOUS | Status: AC
  Administered 2022-12-29: 8.89 via INTRAVENOUS

## 2023-01-02 ENCOUNTER — Encounter: Payer: Self-pay | Admitting: Oncology

## 2023-01-03 ENCOUNTER — Encounter: Payer: Self-pay | Admitting: Oncology

## 2023-01-04 ENCOUNTER — Ambulatory Visit: Payer: Medicare Other

## 2023-01-04 ENCOUNTER — Other Ambulatory Visit: Payer: Self-pay | Admitting: Nurse Practitioner

## 2023-01-04 ENCOUNTER — Other Ambulatory Visit: Payer: Medicare Other

## 2023-01-04 ENCOUNTER — Ambulatory Visit: Payer: Medicare Other | Admitting: Oncology

## 2023-01-04 ENCOUNTER — Inpatient Hospital Stay: Payer: Medicare Other

## 2023-01-04 DIAGNOSIS — C3492 Malignant neoplasm of unspecified part of left bronchus or lung: Secondary | ICD-10-CM

## 2023-01-04 MED ORDER — FENTANYL 25 MCG/HR TD PT72
1.0000 | MEDICATED_PATCH | TRANSDERMAL | 0 refills | Status: DC
Start: 2023-01-04 — End: 2023-02-21

## 2023-01-07 ENCOUNTER — Other Ambulatory Visit: Payer: Self-pay | Admitting: Oncology

## 2023-01-09 ENCOUNTER — Ambulatory Visit (INDEPENDENT_AMBULATORY_CARE_PROVIDER_SITE_OTHER): Payer: Medicare Other | Admitting: Podiatry

## 2023-01-09 ENCOUNTER — Encounter: Payer: Self-pay | Admitting: Podiatry

## 2023-01-09 DIAGNOSIS — M79676 Pain in unspecified toe(s): Secondary | ICD-10-CM | POA: Diagnosis not present

## 2023-01-09 DIAGNOSIS — B351 Tinea unguium: Secondary | ICD-10-CM

## 2023-01-09 NOTE — Progress Notes (Unsigned)
Subjective:  Patient ID: [name], female    DOB: 1935-02-25,  MRN: [id number] HPI No chief complaint on file.   88 y.o. female presents with the above complaint.   ROS: Denies fever chills nausea vomit muscle aches pains calf pain back pain chest pain shortness of breath.  Past Medical History:  Diagnosis Date   Aneurysm (HCC)    Right eye a non DES stent was placed so that she would not required long term dual antiplatelet therapy.   Anginal pain (HCC)    Anxiety    not currently taking any meds   Arthritis    CAD (coronary artery disease)    Diabetes mellitus without complication (HCC)    H/O hiatal hernia    Heart murmur    History of stress test 03/31/2012   The post stress myocardial perfusion images show a normal pattern of perfusion in all region. The post left ventricles is normal in size. There is no scintigraphic of inductible myocardial ischemia. The post EF is 30   Hx of echocardiogram 11/29/2010   Ef 67% Normal size chambes, Aortic valve sclerosis without stenosis, No other significant valvular abnormalities, No percardial effusion.   Hyperlipemia    Hypertension    Insulin resistance    Lung cancer (HCC)    Multiple thyroid nodules    Neuromuscular disorder (HCC)    nerve pain after shingles   Osteoporosis    Restless legs    Shingles    Past Surgical History:  Procedure Laterality Date   ABDOMINAL HYSTERECTOMY  1970   ANGIOPLASTY     Stenting of a 90% eccentric right coronary artery stenosis and had a 3.0x15 mm Integrity bare-metal stent inserted   ANTERIOR CERVICAL DECOMP/DISCECTOMY FUSION N/A 07/30/2013   Procedure: ANTERIOR CERVICAL DECOMPRESSION/DISCECTOMY FUSION CERVICAL FIVE -SIX;  Surgeon: Tia Alert, MD;  Location: [facility];  Service: Neurosurgery;  Laterality: N/A;   APPENDECTOMY     BREAST SURGERY Left    cysts removed from left breast (in her 20'2)   CARDIAC CATHETERIZATION     Showed a widely patent stent, she did have  60% ostial diagonal-1 stenosis, She also had mild luminal irregularities of her LAD.   COLONOSCOPY     EYE SURGERY Bilateral 2009   cateract surgery- bilateral   IR IMAGING GUIDED PORT INSERTION  06/04/2019   LAMINECTOMY WITH POSTERIOR LATERAL ARTHRODESIS LEVEL 2 Right 07/18/2021   Procedure: Laminectomy and Foraminotomy - Lumbar four-Lumbar five - Lumbar five-Sacral one- right, posterolateral fusion Lumbar four-five with interspinous plate;  Surgeon: Tia Alert, MD;  Location: [facility];  Service: Neurosurgery;  Laterality: Right;   LEFT HEART CATH AND CORONARY ANGIOGRAPHY N/A 11/15/2021   Procedure: LEFT HEART CATH AND CORONARY ANGIOGRAPHY;  Surgeon: Lennette Bihari, MD;  Location: [facility];  Service: Cardiovascular;  Laterality: N/A;   REFRACTIVE SURGERY Left 2011   TONSILLECTOMY      Current Outpatient Medications:    ALPRAZolam (XANAX) 0.5 MG tablet, Take 0.5 mg by mouth 2 (two) times daily as needed for anxiety., Disp: , Rfl:    aspirin EC 81 MG tablet, Take 1 tablet (81 mg total) by mouth daily. Swallow whole., Disp: 90 tablet, Rfl: 3   carboxymethylcellulose (REFRESH PLUS) 0.5 % SOLN, Place 1 drop into both eyes 3 (three) times daily as needed (dry eyes)., Disp: , Rfl:    fentaNYL (DURAGESIC) 25 MCG/HR, Place 1 patch onto the skin every 3 (three) days., Disp: 5 patch,  Rfl: 0   gabapentin (NEURONTIN) 300 MG capsule, Take 300 mg by mouth at bedtime. , Disp: , Rfl:    hydrochlorothiazide (MICROZIDE) 12.5 MG capsule, Take 12.5 mg by mouth daily as needed (fluid). (Patient not taking: Reported on 12/21/2022), Disp: , Rfl:    HYDROcodone-acetaminophen (NORCO) 10-325 MG tablet, Take 1 tablet by mouth every 4 (four) hours as needed., Disp: 100 tablet, Rfl: 0   isosorbide mononitrate (IMDUR) 30 MG 24 hr tablet, TAKE 1 TABLET BY MOUTH DAILY, Disp: 90 tablet, Rfl: 3   lisinopril (ZESTRIL) 10 MG tablet, TAKE 1 TABLET BY MOUTH DAILY, Disp: 90 tablet, Rfl: 3   Magnesium 400 MG CAPS, Take 250  mg by mouth as needed. Takes with pain med, Disp: , Rfl:    metFORMIN (GLUCOPHAGE) 500 MG tablet, Take 500 mg by mouth at bedtime., Disp: , Rfl:    methylPREDNISolone (MEDROL) 4 MG tablet, Take 4 mg by mouth daily., Disp: , Rfl:    metoprolol tartrate (LOPRESSOR) 25 MG tablet, Take 0.5 tablets (12.5 mg total) by mouth 2 (two) times daily., Disp: 45 tablet, Rfl: 3   Multiple Vitamins-Minerals (CENTRUM SILVER PO), Take 1 tablet by mouth daily., Disp: , Rfl:    nitroGLYCERIN (NITROSTAT) 0.4 MG SL tablet, Place 1 tablet (0.4 mg total) under the tongue every 5 (five) minutes as needed for chest pain. (Patient not taking: Reported on 10/12/2022), Disp: 25 tablet, Rfl: 11   polyethylene glycol (MIRALAX / GLYCOLAX) packet, Take 17 g by mouth daily., Disp: , Rfl:    rOPINIRole (REQUIP) 1 MG tablet, Take 1 mg by mouth 3 (three) times daily., Disp: , Rfl:    rosuvastatin (CRESTOR) 40 MG tablet, Take 40 mg by mouth at bedtime. , Disp: , Rfl:    traZODone (DESYREL) 100 MG tablet, Take 100 mg by mouth at bedtime. , Disp: , Rfl:    Vitamin D, Ergocalciferol, (DRISDOL) 50000 UNITS CAPS capsule, Take 50,000 Units by mouth every Sunday., Disp: , Rfl:    zinc gluconate 50 MG tablet, Take 50 mg by mouth at bedtime., Disp: , Rfl:   Allergies  Allergen Reactions   Codeine Nausea And Vomiting and Other (See Comments)    Severe constipation.   Review of Systems Objective:  There were no vitals filed for this visit.  General: Well developed, nourished, in no acute distress, alert and oriented x3   Dermatological: Skin is warm, dry and supple bilateral. Nails x 10 are thick yellow dystrophic onychomycotic sharply incurvated tender on palpation.; remaining integument appears unremarkable at this time. There are no open sores, no preulcerative lesions, no rash or signs of infection present.  Vascular: Dorsalis Pedis artery and Posterior Tibial artery pedal pulses are 2/4 bilateral with immedate capillary fill time.  Pedal hair growth present. No varicosities and no lower extremity edema present bilateral.   Neruologic: Grossly intact via light touch bilateral. Vibratory intact via tuning fork bilateral. Protective threshold with Semmes Wienstein monofilament intact to all pedal sites bilateral. Patellar and Achilles deep tendon reflexes 2+ bilateral. No Babinski or clonus noted bilateral.   Musculoskeletal: No gross boney pedal deformities bilateral. No pain, crepitus, or limitation noted with foot and ankle range of motion bilateral. Muscular strength 5/5 in all groups tested bilateral.  Gait: Unassisted, Nonantalgic.    Radiographs:  None taken  Assessment & Plan:   Assessment: Pain limb secondary to onychomycosis onychocryptosis.  Plan: Debridement of nails 1 through 5 bilateral.     Azure Budnick T. [place], [place]

## 2023-01-10 ENCOUNTER — Other Ambulatory Visit: Payer: Self-pay

## 2023-01-11 ENCOUNTER — Encounter: Payer: Self-pay | Admitting: Nurse Practitioner

## 2023-01-11 ENCOUNTER — Inpatient Hospital Stay: Payer: Medicare Other | Attending: Oncology

## 2023-01-11 ENCOUNTER — Inpatient Hospital Stay (HOSPITAL_BASED_OUTPATIENT_CLINIC_OR_DEPARTMENT_OTHER): Payer: Medicare Other | Admitting: Nurse Practitioner

## 2023-01-11 ENCOUNTER — Inpatient Hospital Stay: Payer: Medicare Other

## 2023-01-11 VITALS — BP 100/47 | HR 57

## 2023-01-11 VITALS — BP 113/60 | HR 70 | Temp 98.1°F | Resp 18 | Ht 65.0 in | Wt 176.0 lb

## 2023-01-11 DIAGNOSIS — Z5112 Encounter for antineoplastic immunotherapy: Secondary | ICD-10-CM | POA: Diagnosis present

## 2023-01-11 DIAGNOSIS — C3492 Malignant neoplasm of unspecified part of left bronchus or lung: Secondary | ICD-10-CM

## 2023-01-11 DIAGNOSIS — C7951 Secondary malignant neoplasm of bone: Secondary | ICD-10-CM | POA: Diagnosis present

## 2023-01-11 DIAGNOSIS — C3432 Malignant neoplasm of lower lobe, left bronchus or lung: Secondary | ICD-10-CM | POA: Diagnosis present

## 2023-01-11 LAB — CBC WITH DIFFERENTIAL (CANCER CENTER ONLY)
Abs Immature Granulocytes: 0.01 10*3/uL (ref 0.00–0.07)
Basophils Absolute: 0 10*3/uL (ref 0.0–0.1)
Basophils Relative: 0 %
Eosinophils Absolute: 0.1 10*3/uL (ref 0.0–0.5)
Eosinophils Relative: 2 %
HCT: 32.5 % — ABNORMAL LOW (ref 36.0–46.0)
Hemoglobin: 10.6 g/dL — ABNORMAL LOW (ref 12.0–15.0)
Immature Granulocytes: 0 %
Lymphocytes Relative: 12 %
Lymphs Abs: 0.6 10*3/uL — ABNORMAL LOW (ref 0.7–4.0)
MCH: 29.5 pg (ref 26.0–34.0)
MCHC: 32.6 g/dL (ref 30.0–36.0)
MCV: 90.5 fL (ref 80.0–100.0)
Monocytes Absolute: 0.4 10*3/uL (ref 0.1–1.0)
Monocytes Relative: 9 %
Neutro Abs: 3.7 10*3/uL (ref 1.7–7.7)
Neutrophils Relative %: 77 %
Platelet Count: 175 10*3/uL (ref 150–400)
RBC: 3.59 MIL/uL — ABNORMAL LOW (ref 3.87–5.11)
RDW: 13.2 % (ref 11.5–15.5)
WBC Count: 4.8 10*3/uL (ref 4.0–10.5)
nRBC: 0 % (ref 0.0–0.2)

## 2023-01-11 LAB — CMP (CANCER CENTER ONLY)
ALT: 12 U/L (ref 0–44)
AST: 17 U/L (ref 15–41)
Albumin: 4.2 g/dL (ref 3.5–5.0)
Alkaline Phosphatase: 70 U/L (ref 38–126)
Anion gap: 8 (ref 5–15)
BUN: 34 mg/dL — ABNORMAL HIGH (ref 8–23)
CO2: 26 mmol/L (ref 22–32)
Calcium: 10.1 mg/dL (ref 8.9–10.3)
Chloride: 101 mmol/L (ref 98–111)
Creatinine: 1.22 mg/dL — ABNORMAL HIGH (ref 0.44–1.00)
GFR, Estimated: 43 mL/min — ABNORMAL LOW (ref >60)
Glucose, Bld: 145 mg/dL — ABNORMAL HIGH (ref 70–99)
Potassium: 4.4 mmol/L (ref 3.5–5.1)
Sodium: 135 mmol/L (ref 135–145)
Total Bilirubin: 0.4 mg/dL (ref 0.3–1.2)
Total Protein: 6.8 g/dL (ref 6.5–8.1)

## 2023-01-11 MED ORDER — METHYLPREDNISOLONE 4 MG PO TABS
2.0000 mg | ORAL_TABLET | Freq: Every day | ORAL | 1 refills | Status: DC
Start: 2023-01-11 — End: 2023-04-07

## 2023-01-11 MED ORDER — SODIUM CHLORIDE 0.9 % IV SOLN: Freq: Once | INTRAVENOUS | Status: AC

## 2023-01-11 MED ORDER — SODIUM CHLORIDE 0.9% FLUSH
10.0000 mL | INTRAVENOUS | Status: DC | PRN
  Administered 2023-01-11: 10 mL

## 2023-01-11 MED ORDER — SODIUM CHLORIDE 0.9 % IV SOLN
200.0000 mg | Freq: Once | INTRAVENOUS | Status: AC
  Administered 2023-01-11: 200 mg via INTRAVENOUS
  Filled 2023-01-11: qty 8

## 2023-01-11 MED ORDER — HEPARIN SOD (PORK) LOCK FLUSH 100 UNIT/ML IV SOLN
500.0000 [IU] | Freq: Once | INTRAVENOUS | Status: AC | PRN
  Administered 2023-01-11: 500 [IU]

## 2023-01-11 NOTE — Progress Notes (Signed)
Patient seen by [name] NP today  Vitals are within treatment parameters.  Labs reviewed by [name] NP and are within treatment parameters.  Per physician team, patient is ready for treatment and there are NO modifications to the treatment plan.     

## 2023-01-11 NOTE — Patient Instructions (Signed)

## 2023-01-11 NOTE — Patient Instructions (Signed)
[facility]   Discharge Instructions: Thank you for choosing [facility] to provide your oncology and hematology care.   If you have a lab appointment with the Cancer Center, please go directly to the Cancer Center and check in at the registration area.   Wear comfortable clothing and clothing appropriate for easy access to any Portacath or PICC line.   We strive to give you quality time with your provider. You may need to reschedule your appointment if you arrive late (15 or more minutes).  Arriving late affects you and other patients whose appointments are after yours.  Also, if you miss three or more appointments without notifying the office, you may be dismissed from the clinic at the provider's discretion.      For prescription refill requests, have your pharmacy contact our office and allow 72 hours for refills to be completed.    Today you received the following chemotherapy and/or immunotherapy agents Keytruda.      To help prevent nausea and vomiting after your treatment, we encourage you to take your nausea medication as directed.  BELOW ARE SYMPTOMS THAT SHOULD BE REPORTED IMMEDIATELY: *FEVER GREATER THAN 100.4 F (38 C) OR HIGHER *CHILLS OR SWEATING *NAUSEA AND VOMITING THAT IS NOT CONTROLLED WITH YOUR NAUSEA MEDICATION *UNUSUAL SHORTNESS OF BREATH *UNUSUAL BRUISING OR BLEEDING *URINARY PROBLEMS (pain or burning when urinating, or frequent urination) *BOWEL PROBLEMS (unusual diarrhea, constipation, pain near the anus) TENDERNESS IN MOUTH AND THROAT WITH OR WITHOUT PRESENCE OF ULCERS (sore throat, sores in mouth, or a toothache) UNUSUAL RASH, SWELLING OR PAIN  UNUSUAL VAGINAL DISCHARGE OR ITCHING   Items with * indicate a potential emergency and should be followed up as soon as possible or go to the Emergency Department if any problems should occur.  Please show the CHEMOTHERAPY ALERT CARD or IMMUNOTHERAPY ALERT CARD at  check-in to the Emergency Department and triage nurse.  Should you have questions after your visit or need to cancel or reschedule your appointment, please contact [facility]  Dept: [phone]  and follow the prompts.  Office hours are 8:00 a.m. to 4:30 p.m. Monday - Friday. Please note that voicemails left after 4:00 p.m. may not be returned until the following business day.  We are closed weekends and major holidays. You have access to a nurse at all times for urgent questions. Please call the main number to the clinic Dept: [phone] and follow the prompts.   For any non-urgent questions, you may also contact your provider using MyChart. We now offer e-Visits for anyone 18 and older to request care online for non-urgent symptoms. For details visit [email or web address].   Also download the MyChart app! Go to the app store, search "MyChart", open the app, select [facility], and log in with your MyChart username and password.  Pembrolizumab Injection What is this medication? PEMBROLIZUMAB (PEM broe LIZ ue mab) treats some types of cancer. It works by helping your immune system slow or stop the spread of cancer cells. It is a monoclonal antibody. This medicine may be used for other purposes; ask your health care provider or pharmacist if you have questions. COMMON BRAND NAME(S): Keytruda What should I tell my care team before I take this medication? They need to know if you have any of these conditions: Allogeneic stem cell transplant (uses someone else's stem cells) Autoimmune diseases, such as Crohn disease, ulcerative colitis, lupus History of chest radiation Nervous system   problems, such as Guillain-Barre syndrome, myasthenia gravis Organ transplant An unusual or allergic reaction to pembrolizumab, other medications, foods, dyes, or preservatives Pregnant or trying to get pregnant Breast-feeding How should I use this medication? This medication  is injected into a vein. It is given by your care team in a hospital or clinic setting. A special MedGuide will be given to you before each treatment. Be sure to read this information carefully each time. Talk to your care team about the use of this medication in children. While it may be prescribed for children as young as 6 months for selected conditions, precautions do apply. Overdosage: If you think you have taken too much of this medicine contact a poison control center or emergency room at once. NOTE: This medicine is only for you. Do not share this medicine with others. What if I miss a dose? Keep appointments for follow-up doses. It is important not to miss your dose. Call your care team if you are unable to keep an appointment. What may interact with this medication? Interactions have not been studied. This list may not describe all possible interactions. Give your health care provider a list of all the medicines, herbs, non-prescription drugs, or dietary supplements you use. Also tell them if you smoke, drink alcohol, or use illegal drugs. Some items may interact with your medicine. What should I watch for while using this medication? Your condition will be monitored carefully while you are receiving this medication. You may need blood work while taking this medication. This medication may cause serious skin reactions. They can happen weeks to months after starting the medication. Contact your care team right away if you notice fevers or flu-like symptoms with a rash. The rash may be red or purple and then turn into blisters or peeling of the skin. You may also notice a red rash with swelling of the face, lips, or lymph nodes in your neck or under your arms. Tell your care team right away if you have any change in your eyesight. Talk to your care team if you may be pregnant. Serious birth defects can occur if you take this medication during pregnancy and for 4 months after the last dose. You  will need a negative pregnancy test before starting this medication. Contraception is recommended while taking this medication and for 4 months after the last dose. Your care team can help you find the option that works for you. Do not breastfeed while taking this medication and for 4 months after the last dose. What side effects may I notice from receiving this medication? Side effects that you should report to your care team as soon as possible: Allergic reactions--skin rash, itching, hives, swelling of the face, lips, tongue, or throat Dry cough, shortness of breath or trouble breathing Eye pain, redness, irritation, or discharge with blurry or decreased vision Heart muscle inflammation--unusual weakness or fatigue, shortness of breath, chest pain, fast or irregular heartbeat, dizziness, swelling of the ankles, feet, or hands Hormone gland problems--headache, sensitivity to light, unusual weakness or fatigue, dizziness, fast or irregular heartbeat, increased sensitivity to cold or heat, excessive sweating, constipation, hair loss, increased thirst or amount of urine, tremors or shaking, irritability Infusion reactions--chest pain, shortness of breath or trouble breathing, feeling faint or lightheaded Kidney injury (glomerulonephritis)--decrease in the amount of urine, red or dark brown urine, foamy or bubbly urine, swelling of the ankles, hands, or feet Liver injury--right upper belly pain, loss of appetite, nausea, light-colored stool, dark yellow or brown   urine, yellowing skin or eyes, unusual weakness or fatigue Pain, tingling, or numbness in the hands or feet, muscle weakness, change in vision, confusion or trouble speaking, loss of balance or coordination, trouble walking, seizures Rash, fever, and swollen lymph nodes Redness, blistering, peeling, or loosening of the skin, including inside the mouth Sudden or severe stomach pain, bloody diarrhea, fever, nausea, vomiting Side effects that  usually do not require medical attention (report to your care team if they continue or are bothersome): Bone, joint, or muscle pain Diarrhea Fatigue Loss of appetite Nausea Skin rash This list may not describe all possible side effects. Call your doctor for medical advice about side effects. You may report side effects to FDA at 1-800-FDA-1088. Where should I keep my medication? This medication is given in a hospital or clinic. It will not be stored at home. NOTE: This sheet is a summary. It may not cover all possible information. If you have questions about this medicine, talk to your doctor, pharmacist, or health care provider.  2023 Elsevier/Gold Standard (2021-11-29 00:00:00)   

## 2023-01-11 NOTE — Progress Notes (Signed)
[facility] OFFICE PROGRESS NOTE   Diagnosis: Non-small cell lung cancer  INTERVAL HISTORY:   [name] returns as scheduled.  She completed another cycle of Pembrolizumab 12/21/2022.  No rash or diarrhea.  She continues to have pain at the right hip/groin.  She was unable to tolerate the Duragesic patch.  She has discontinued it.  She would like to try increasing hydrocodone.  This morning she woke up with what she feels like is arthritis pain involving multiple areas.  Objective:  Vital signs in last 24 hours:  Blood pressure 113/60, pulse 70, temperature 98.1 F (36.7 C), temperature source Oral, resp. rate 18, height 5\' 5"  (1.651 m), weight 176 lb (79.8 kg), SpO2 96 %.    HEENT: No thrush or ulcers. Resp: Lungs clear bilaterally. Cardio: Regular rate and rhythm. GI: No hepatosplenomegaly. Vascular: No leg edema. Port-A-Cath without erythema.  Lab Results:  Lab Results  Component Value Date   WBC 4.8 01/11/2023   HGB 10.6 (L) 01/11/2023   HCT 32.5 (L) 01/11/2023   MCV 90.5 01/11/2023   PLT 175 01/11/2023   NEUTROABS 3.7 01/11/2023    Imaging:  No results found.  Medications: I have reviewed the patient's current medications.  Assessment/Plan: Non-small cell lung cancer MRI lumbar spine 04/29/2019- enlarging marrow lesions involving the L1 vertebral body, upper left sacrum and right iliac bone MRI pelvis 04/29/2019- 3.5 cm left iliac bone lesion appears slightly larger; other similar appearing lesions present within the left superior pubic ramus, left superior abdomen acetabulum and upper left sacrum Kappa free light chains with mild elevation 05/12/2019  CTs 05/12/2019- left lower lobe pulmonary mass 3.3 x 3.2 cm; lytic process left iliac bone; spinal lesions; 1.1 cm low-density left kidney lesion; right thyroid enlargement with heterogeneous appearance with potential for multiple discrete lesions Biopsy left lower lobe lung mass 05/26/2019-poorly  differentiated carcinoma; positive for cytokeratin 5/6, p63 and TTF-1, no EGFR, BRAF, ALK, ERBB2,ROS, or NTRK alteration Cycle 1 carboplatin/Alimta/pembrolizumab 06/06/2019 Cycle 2 carboplatin/Alimta/pembrolizumab 06/27/2019 Cycle 3 carboplatin/Alimta/pembrolizumab 07/18/2019 Cycle 4 carboplatin/Alimta/pembrolizumab 08/07/2019 CTs 08/27/2019-significant decrease in size of lobulated mass left lower lobe.  Unchanged appearance of subtle bone lesions. Cycle 5 Alimta/pembrolizumab 08/28/2019 Cycle 6 Alimta/pembrolizumab 09/18/2019 Cycle 7 Alimta/pembrolizumab 10/09/2019 Cycle 8 Alimta/pembrolizumab 10/30/2019 Cycle 9 Alimta/pembrolizumab 11/20/2019 CTs 12/09/2019-no evidence of disease progression, left lower lobe nodule slightly decreased in size, stable L1, left sacral, and left pubic ramus metastases Cycle 10 Alimta/pembrolizumab 12/11/2019 Cycle 11 pembrolizumab alone 01/02/2020 (Alimta held due to edema, tenderness, erythema at the lower legs) Cycle 12 pembrolizumab 01/23/2020 Cycle 13 pembrolizumab 02/12/2020 Cycle 14 pembrolizumab 03/04/2020 CTs 03/18/2020-stable left lower lobe lesion, mild sclerosis at the superior endplate of L1 that was previously hypermetabolic, stable small left upper sacral lucent lesion, previous left superior pubic ramus lesion is occult on the CT, CT head negative for malignancy Cycle 15 pembrolizumab 03/25/2020 Cycle 16 pembrolizumab 04/21/2020 Cycle 17 pembrolizumab 05/17/2020 05/19/2020 bone scan-no definite abnormalities to suggest osseous metastases.  Areas of concern on prior PET-CT involving left iliac bone and L1 vertebral body showed no abnormalities on the current study Cycle 18 Pembrolizumab 06/10/2020 Cycle 19 Pembrolizumab 06/30/2020 CTs 07/16/2020-stable left lower lobe nodule, stable faint superior L1 vertebral lesion, no evidence of disease progression Cycle 20 pembrolizumab 07/21/2020 Cycle 21 Pembrolizumab 08/11/2020 Cycle 22 pembrolizumab 09/01/2020 Cycle 23  Pembrolizumab 09/22/2020 Cycle 24 pembrolizumab 10/13/2020 Cycle 25 Pembrolizumab 11/03/2020 Cycle 26 pembrolizumab 11/26/2020 CTs 12/15/2020- stable left lower lobe mass, stable sclerotic lesion at L2, no evidence of disease  progression Cycle 27 pembrolizumab 12/17/2020 PET scan 01/03/2021-1.8 x 1.3 cm left lower lobe nodule similar in size to CT of 12/15/2020 and measures smaller than previous PET/CT from 2020.  Nodule is markedly hypermetabolic.  No evidence for hypermetabolic hilar or mediastinal lymphadenopathy.  Several tiny foci of hypermetabolism identified in bony anatomy raising concern for skeletal metastases.  Comparison of the PET to CT 07/16/2020-lobular left lower lobe pulmonary nodule measured 2.4 x 1.5 cm on the prior study, current study it measured 1.8 x 1.3 cm.  Lesion in the anterior left acetabulum is similar to the 07/16/2020 exam although overlying cortical thinning slightly more pronounced on the current study.  Described lesion in the scapula shows some cortical sclerosis and a tiny central marrow lucency not substantially changed compared to 07/16/2020.  Left third rib lesion shows heterogeneous mineralization similar to 07/16/2020. MRI of cervical spine 01/13/2021-increased left facet edema at C5-C6, severe facet arthrosis on the left at C7-T1 and on the right at C3-C4, no evidence of metastatic disease Cycle 28 Pembrolizumab 01/14/2021 Cycle 29 Pembrolizumab 02/03/2021 Cycle 30 Pembrolizumab 02/24/2021 MRI left hip 03/02/2021-compared to 04/29/2019, slight increase in size of metastases at the left iliac and left acetabulum.  Lesion at the left upper sacrum is less conspicuous and a lesion at the left superior pubic ramus is stable Cycle 31 pembrolizumab 03/17/2021 Radiation to left acetabulum and left iliac 03/30/2021-04/13/2021 Cycle 32 pembrolizumab 04/07/2021 Cycle 33 Pembrolizumab 04/28/2021 Cycle 34 pembrolizumab 05/18/2021 PET scan 06/01/2021-no significant change in size or degree of FDG  uptake associated with FDG avid left lower lobe lung nodule compatible with neoplasm.  Stable to improved appearance of multifocal FDG avid bone metastasis. Cycle 35 pembrolizumab 06/10/2021 Cycle 36 pembrolizumab 07/01/2021 Cycle 37 pembrolizumab 07/29/2021 Cycle 38 pembrolizumab 08/19/2021 Cycle 39 Pembrolizumab 09/09/2021 Cycle 40 pembrolizumab 09/30/2021 CT chest 10/20/2021 to evaluate complaints of chest pain-no PE.  Interval increase in left lower lobe mass. Cycle 41 Pembrolizumab 10/21/2021 Cycle 42 pembrolizumab 11/11/2021 PET 11/24/2021-mild increase in size of left lower lobe mass, no evidence of solid organ or nodal metastases, stable multifocal bone metastases.  No new sites of metastatic disease. Cycle 43 pembrolizumab 11/29/2021 Cycle 45 Pembrolizumab 12/20/2021 Cycle 46 Pembrolizumab 01/09/2022 Cycle 47 pembrolizumab 02/01/2022 Cycle 48 Pembrolizumab 02/24/2022 Cycle 49 Pembrolizumab 03/17/2022 Cycle 50 pembrolizumab 04/07/2022 MRI l pelvis 04/12/2022-"new "lesion at the left superior acetabulum, stable bone lesions at the left suprapubic ramus, left S1, and left iliac Cycle 51 pembrolizumab 04/28/2022 Palliative radiation to the left hip and left scapula 04/26/2022-05/09/2022 Cycle 52 pembrolizumab 05/18/2022 PET 06/02/2022-stable left lower lobe tumor, slight increase in hypermetabolism associated with the left acetabular metastasis, additional previously seen bone lesions no longer have hypermetabolism Cycle 53 pembrolizumab 06/08/2022 Cycle 54 pembrolizumab 06/29/2022 Cycle 55 pembrolizumab 07/20/2022 Cycle 56 pembrolizumab 08/10/2022 Cycle 57 pembrolizumab 08/31/2022 Cycle 58 pembrolizumab 09/21/2022 Cycle 59 pembrolizumab 10/12/2022 Cycle 60 Pembrolizumab 11/02/2022 Cycle 61 pembrolizumab 11/23/2022 Cycle 62 Pembrolizumab 12/21/2022 PET scan 12/29/2022-mild increase in size and activity of the left lower lobe mass Cycle 63 Pembrolizumab 01/11/2023 Pain secondary to metastatic lung  cancer Chronic back pain Type 2 diabetes Essential hypertension CAD Hyperlipidemia Family history significant for multiple members with breast cancer Grade 1 skin rash 07/18/2019 likely related to immunotherapy.  Topical steroid cream as needed. E. coli urinary tract infection 07/14/2019.  Completed cephalexin. Edema/tenderness at the right greater than left ankle 10/21/2019-etiology unclear, potentially related to systemic therapy or an infection, doxycycline prescribed-improved 10/23/2019; marked improvement 11/20/2019; at office visit 01/02/2020 she reports worsening of  lower extremity edema, pain/tenderness, erythema 3 to 4 days following each treatment.  Alimta held 01/02/2020.  Referral to dermatology. COVID-19 infection 03/30/2020, monoclonal antibody therapy 04/06/2020 Arthritis, possibly related to Pembrolizumab, improved since beginning Medrol, now followed by Dr. Dierdre Forth Pain left femoral head/upper femur/groin-negative plain x-ray; CT pelvis 03/28/2022-no acute appearing bone abnormality at the left hip or involving the proximal left femur, no evidence of acute fracture or dislocation, soft tissues left hip and left groin unremarkable; osseous mets within the left sacrum, left iliac bone and anterior acetabulum without evidence of associated fracture or dislocation. Percutaneous fixation of left pathologic acetabular fracture 08/23/2022 CT pelvis and left hip 11/02/2022-subacute nondisplaced fracture through the supra-acetabular left ilium extending through the acetabular roof, placement of 2 screws within the left ilium extending to the supra-acetabular region with an additional screw traversing the left superior pubic ramus, subtle sclerosis in the left ilium and superior left acetabulum, no new site of metastatic disease.      Disposition: [name] appears unchanged.  She is on active treatment with every 3-week Pembrolizumab.  Restaging PET scan is stable overall.  Results/images reviewed  with her and her daughter at today's visit.  Recommendation is to continue Pembrolizumab every 3 weeks.  She agrees with this plan.  CBC and chemistry panel reviewed.  Labs adequate to proceed as above.  For the arthritis pain she will resume Medrol 2 mg daily.  She will return for follow-up in 3 weeks.  Patient seen with Dr. Truett Perna.  Lonna Cobb ANP/GNP-BC   01/11/2023  10:51 AM  This was a shared visit with Lonna Cobb.  We reviewed the PET findings and images with [name].  There has been a mild increase in the size of the left lower lobe mass.  No other evidence of disease progression.  The plan is to continue pembrolizumab.  She will resume low-dose Medrol for treatment of the diffuse arthritis.  I was present for greater than 50% of today's visit.  I performed medical decision making.  Mancel Bale, MD

## 2023-01-12 ENCOUNTER — Encounter: Payer: Self-pay | Admitting: Oncology

## 2023-01-19 ENCOUNTER — Telehealth: Payer: Self-pay | Admitting: *Deleted

## 2023-01-19 ENCOUNTER — Encounter: Payer: Self-pay | Admitting: Oncology

## 2023-01-19 DIAGNOSIS — C3492 Malignant neoplasm of unspecified part of left bronchus or lung: Secondary | ICD-10-CM

## 2023-01-19 NOTE — Telephone Encounter (Signed)
Notified patient that Dr. Truett Perna ordered a DG left hip and she can go to [facility] radiology any day when they are open. This is a walk-in xray.

## 2023-01-19 NOTE — Telephone Encounter (Signed)
Called [name] to f/u on her Mychart report of increased pain in left hip/groin/leg that started 3-4 days ago after getting up from toilet one day. Pain is 4-5 at rest and increases to 10 with activity. Hydrocodone helps some and she is taking the Medrol 2 mg daily. She is not using Duragesic. Denies any new neuropathy or decreased sensation. She is asking about an xray. No edema. MD notified.

## 2023-01-20 ENCOUNTER — Other Ambulatory Visit: Payer: Self-pay | Admitting: Oncology

## 2023-01-20 ENCOUNTER — Ambulatory Visit (HOSPITAL_BASED_OUTPATIENT_CLINIC_OR_DEPARTMENT_OTHER)
Admission: RE | Admit: 2023-01-20 | Discharge: 2023-01-20 | Disposition: A | Discharge status: Home / Self Care | Payer: Medicare Other | Source: Ambulatory Visit | Attending: Oncology | Admitting: Oncology

## 2023-01-20 DIAGNOSIS — C3492 Malignant neoplasm of unspecified part of left bronchus or lung: Secondary | ICD-10-CM | POA: Diagnosis present

## 2023-01-23 ENCOUNTER — Telehealth: Payer: Self-pay

## 2023-01-23 NOTE — Telephone Encounter (Signed)
-----   Message from Ladene Artist, MD sent at 01/23/2023 10:28 AM EDT ----- Please call patient hip x-ray shows intact hardware from the orthopedic surgery, no acute fracture or new finding, follow-up as scheduled

## 2023-01-23 NOTE — Telephone Encounter (Signed)
Called patient and informed her of hip x-ray results per Dr. Thornton Papas.  Patient verbalized understanding.  Patient had no further questions or concerns. Patient's next appointment was confirmed and patient agreed to contact office if she had any future questions or concerns.

## 2023-01-28 ENCOUNTER — Other Ambulatory Visit: Payer: Self-pay | Admitting: Oncology

## 2023-01-31 ENCOUNTER — Encounter: Payer: Self-pay | Admitting: Nurse Practitioner

## 2023-01-31 ENCOUNTER — Inpatient Hospital Stay: Payer: Medicare Other

## 2023-01-31 ENCOUNTER — Inpatient Hospital Stay (HOSPITAL_BASED_OUTPATIENT_CLINIC_OR_DEPARTMENT_OTHER): Payer: Medicare Other | Admitting: Nurse Practitioner

## 2023-01-31 ENCOUNTER — Inpatient Hospital Stay: Payer: Medicare Other | Attending: Oncology

## 2023-01-31 VITALS — BP 138/59 | HR 56 | Temp 97.9°F | Resp 18 | Ht 65.0 in | Wt 175.8 lb

## 2023-01-31 VITALS — BP 117/53 | HR 49

## 2023-01-31 DIAGNOSIS — Z87891 Personal history of nicotine dependence: Secondary | ICD-10-CM | POA: Diagnosis not present

## 2023-01-31 DIAGNOSIS — C7951 Secondary malignant neoplasm of bone: Secondary | ICD-10-CM | POA: Diagnosis present

## 2023-01-31 DIAGNOSIS — Z5112 Encounter for antineoplastic immunotherapy: Secondary | ICD-10-CM | POA: Diagnosis present

## 2023-01-31 DIAGNOSIS — C3492 Malignant neoplasm of unspecified part of left bronchus or lung: Secondary | ICD-10-CM

## 2023-01-31 DIAGNOSIS — Z79620 Long term (current) use of immunosuppressive biologic: Secondary | ICD-10-CM | POA: Diagnosis not present

## 2023-01-31 DIAGNOSIS — C3432 Malignant neoplasm of lower lobe, left bronchus or lung: Secondary | ICD-10-CM | POA: Insufficient documentation

## 2023-01-31 LAB — CBC WITH DIFFERENTIAL (CANCER CENTER ONLY)
Abs Immature Granulocytes: 0.01 10*3/uL (ref 0.00–0.07)
Basophils Absolute: 0 10*3/uL (ref 0.0–0.1)
Basophils Relative: 0 %
Eosinophils Absolute: 0.2 10*3/uL (ref 0.0–0.5)
Eosinophils Relative: 4 %
HCT: 32.6 % — ABNORMAL LOW (ref 36.0–46.0)
Hemoglobin: 10.6 g/dL — ABNORMAL LOW (ref 12.0–15.0)
Immature Granulocytes: 0 %
Lymphocytes Relative: 14 %
Lymphs Abs: 0.6 10*3/uL — ABNORMAL LOW (ref 0.7–4.0)
MCH: 29.8 pg (ref 26.0–34.0)
MCHC: 32.5 g/dL (ref 30.0–36.0)
MCV: 91.6 fL (ref 80.0–100.0)
Monocytes Absolute: 0.3 10*3/uL (ref 0.1–1.0)
Monocytes Relative: 7 %
Neutro Abs: 3.4 10*3/uL (ref 1.7–7.7)
Neutrophils Relative %: 75 %
Platelet Count: 147 10*3/uL — ABNORMAL LOW (ref 150–400)
RBC: 3.56 MIL/uL — ABNORMAL LOW (ref 3.87–5.11)
RDW: 13.5 % (ref 11.5–15.5)
WBC Count: 4.6 10*3/uL (ref 4.0–10.5)
nRBC: 0 % (ref 0.0–0.2)

## 2023-01-31 LAB — CMP (CANCER CENTER ONLY)
ALT: 10 U/L (ref 0–44)
AST: 15 U/L (ref 15–41)
Albumin: 4.2 g/dL (ref 3.5–5.0)
Alkaline Phosphatase: 70 U/L (ref 38–126)
Anion gap: 7 (ref 5–15)
BUN: 27 mg/dL — ABNORMAL HIGH (ref 8–23)
CO2: 27 mmol/L (ref 22–32)
Calcium: 9.9 mg/dL (ref 8.9–10.3)
Chloride: 103 mmol/L (ref 98–111)
Creatinine: 1.06 mg/dL — ABNORMAL HIGH (ref 0.44–1.00)
GFR, Estimated: 51 mL/min — ABNORMAL LOW (ref >60)
Glucose, Bld: 150 mg/dL — ABNORMAL HIGH (ref 70–99)
Potassium: 4 mmol/L (ref 3.5–5.1)
Sodium: 137 mmol/L (ref 135–145)
Total Bilirubin: 0.4 mg/dL (ref 0.3–1.2)
Total Protein: 6.6 g/dL (ref 6.5–8.1)

## 2023-01-31 MED ORDER — TRAZODONE HCL 50 MG PO TABS: 50.0000 mg | ORAL_TABLET | Freq: Every day | ORAL | 1 refills | Status: DC

## 2023-01-31 MED ORDER — SODIUM CHLORIDE 0.9 % IV SOLN
200.0000 mg | Freq: Once | INTRAVENOUS | Status: AC
  Administered 2023-01-31: 200 mg via INTRAVENOUS
  Filled 2023-01-31: qty 8

## 2023-01-31 MED ORDER — SODIUM CHLORIDE 0.9 % IV SOLN: Freq: Once | INTRAVENOUS | Status: AC

## 2023-01-31 MED ORDER — SODIUM CHLORIDE 0.9% FLUSH
10.0000 mL | INTRAVENOUS | Status: DC | PRN
  Administered 2023-01-31: 10 mL

## 2023-01-31 MED ORDER — HEPARIN SOD (PORK) LOCK FLUSH 100 UNIT/ML IV SOLN
500.0000 [IU] | Freq: Once | INTRAVENOUS | Status: AC | PRN
  Administered 2023-01-31: 500 [IU]

## 2023-01-31 NOTE — Progress Notes (Signed)
Per [name], NP patient is ok to treat

## 2023-01-31 NOTE — Patient Instructions (Signed)
[facility]   Discharge Instructions: Thank you for choosing [facility] to provide your oncology and hematology care.   If you have a lab appointment with the Cancer Center, please go directly to the Cancer Center and check in at the registration area.   Wear comfortable clothing and clothing appropriate for easy access to any Portacath or PICC line.   We strive to give you quality time with your provider. You may need to reschedule your appointment if you arrive late (15 or more minutes).  Arriving late affects you and other patients whose appointments are after yours.  Also, if you miss three or more appointments without notifying the office, you may be dismissed from the clinic at the provider's discretion.      For prescription refill requests, have your pharmacy contact our office and allow 72 hours for refills to be completed.    Today you received the following chemotherapy and/or immunotherapy agents Keytruda       To help prevent nausea and vomiting after your treatment, we encourage you to take your nausea medication as directed.  BELOW ARE SYMPTOMS THAT SHOULD BE REPORTED IMMEDIATELY: *FEVER GREATER THAN 100.4 F (38 C) OR HIGHER *CHILLS OR SWEATING *NAUSEA AND VOMITING THAT IS NOT CONTROLLED WITH YOUR NAUSEA MEDICATION *UNUSUAL SHORTNESS OF BREATH *UNUSUAL BRUISING OR BLEEDING *URINARY PROBLEMS (pain or burning when urinating, or frequent urination) *BOWEL PROBLEMS (unusual diarrhea, constipation, pain near the anus) TENDERNESS IN MOUTH AND THROAT WITH OR WITHOUT PRESENCE OF ULCERS (sore throat, sores in mouth, or a toothache) UNUSUAL RASH, SWELLING OR PAIN  UNUSUAL VAGINAL DISCHARGE OR ITCHING   Items with * indicate a potential emergency and should be followed up as soon as possible or go to the Emergency Department if any problems should occur.  Please show the CHEMOTHERAPY ALERT CARD or IMMUNOTHERAPY ALERT CARD at  check-in to the Emergency Department and triage nurse.  Should you have questions after your visit or need to cancel or reschedule your appointment, please contact [facility]  Dept: [phone]  and follow the prompts.  Office hours are 8:00 a.m. to 4:30 p.m. Monday - Friday. Please note that voicemails left after 4:00 p.m. may not be returned until the following business day.  We are closed weekends and major holidays. You have access to a nurse at all times for urgent questions. Please call the main number to the clinic Dept: [phone] and follow the prompts.   For any non-urgent questions, you may also contact your provider using MyChart. We now offer e-Visits for anyone 18 and older to request care online for non-urgent symptoms. For details visit [email or web address].   Also download the MyChart app! Go to the app store, search "MyChart", open the app, select [facility], and log in with your MyChart username and password.  Pembrolizumab Injection What is this medication? PEMBROLIZUMAB (PEM broe LIZ ue mab) treats some types of cancer. It works by helping your immune system slow or stop the spread of cancer cells. It is a monoclonal antibody. This medicine may be used for other purposes; ask your health care provider or pharmacist if you have questions. COMMON BRAND NAME(S): Keytruda What should I tell my care team before I take this medication? They need to know if you have any of these conditions: Allogeneic stem cell transplant (uses someone else's stem cells) Autoimmune diseases, such as Crohn disease, ulcerative colitis, lupus History of chest radiation Nervous   system problems, such as Guillain-Barre syndrome, myasthenia gravis Organ transplant An unusual or allergic reaction to pembrolizumab, other medications, foods, dyes, or preservatives Pregnant or trying to get pregnant Breast-feeding How should I use this medication? This medication  is injected into a vein. It is given by your care team in a hospital or clinic setting. A special MedGuide will be given to you before each treatment. Be sure to read this information carefully each time. Talk to your care team about the use of this medication in children. While it may be prescribed for children as young as 6 months for selected conditions, precautions do apply. Overdosage: If you think you have taken too much of this medicine contact a poison control center or emergency room at once. NOTE: This medicine is only for you. Do not share this medicine with others. What if I miss a dose? Keep appointments for follow-up doses. It is important not to miss your dose. Call your care team if you are unable to keep an appointment. What may interact with this medication? Interactions have not been studied. This list may not describe all possible interactions. Give your health care provider a list of all the medicines, herbs, non-prescription drugs, or dietary supplements you use. Also tell them if you smoke, drink alcohol, or use illegal drugs. Some items may interact with your medicine. What should I watch for while using this medication? Your condition will be monitored carefully while you are receiving this medication. You may need blood work while taking this medication. This medication may cause serious skin reactions. They can happen weeks to months after starting the medication. Contact your care team right away if you notice fevers or flu-like symptoms with a rash. The rash may be red or purple and then turn into blisters or peeling of the skin. You may also notice a red rash with swelling of the face, lips, or lymph nodes in your neck or under your arms. Tell your care team right away if you have any change in your eyesight. Talk to your care team if you may be pregnant. Serious birth defects can occur if you take this medication during pregnancy and for 4 months after the last dose. You  will need a negative pregnancy test before starting this medication. Contraception is recommended while taking this medication and for 4 months after the last dose. Your care team can help you find the option that works for you. Do not breastfeed while taking this medication and for 4 months after the last dose. What side effects may I notice from receiving this medication? Side effects that you should report to your care team as soon as possible: Allergic reactions--skin rash, itching, hives, swelling of the face, lips, tongue, or throat Dry cough, shortness of breath or trouble breathing Eye pain, redness, irritation, or discharge with blurry or decreased vision Heart muscle inflammation--unusual weakness or fatigue, shortness of breath, chest pain, fast or irregular heartbeat, dizziness, swelling of the ankles, feet, or hands Hormone gland problems--headache, sensitivity to light, unusual weakness or fatigue, dizziness, fast or irregular heartbeat, increased sensitivity to cold or heat, excessive sweating, constipation, hair loss, increased thirst or amount of urine, tremors or shaking, irritability Infusion reactions--chest pain, shortness of breath or trouble breathing, feeling faint or lightheaded Kidney injury (glomerulonephritis)--decrease in the amount of urine, red or dark brown urine, foamy or bubbly urine, swelling of the ankles, hands, or feet Liver injury--right upper belly pain, loss of appetite, nausea, light-colored stool, dark yellow or   brown urine, yellowing skin or eyes, unusual weakness or fatigue Pain, tingling, or numbness in the hands or feet, muscle weakness, change in vision, confusion or trouble speaking, loss of balance or coordination, trouble walking, seizures Rash, fever, and swollen lymph nodes Redness, blistering, peeling, or loosening of the skin, including inside the mouth Sudden or severe stomach pain, bloody diarrhea, fever, nausea, vomiting Side effects that  usually do not require medical attention (report to your care team if they continue or are bothersome): Bone, joint, or muscle pain Diarrhea Fatigue Loss of appetite Nausea Skin rash This list may not describe all possible side effects. Call your doctor for medical advice about side effects. You may report side effects to FDA at 1-800-FDA-1088. Where should I keep my medication? This medication is given in a hospital or clinic. It will not be stored at home. NOTE: This sheet is a summary. It may not cover all possible information. If you have questions about this medicine, talk to your doctor, pharmacist, or health care provider.  2024 Elsevier/Gold Standard (2021-11-29 00:00:00)   

## 2023-01-31 NOTE — Progress Notes (Signed)
[facility] OFFICE PROGRESS NOTE   Diagnosis: Non-small cell lung cancer  INTERVAL HISTORY:   [name] returns as scheduled.  She completed another cycle of Pembrolizumab 01/11/2023.  Pain at the right hip/groin has increased.  She is taking 1 hydrocodone tablet every 4-5 hours with some relief.  No rash or diarrhea.  Appetite is poor.  She is intermittently tearful.  Objective:  Vital signs in last 24 hours:  Blood pressure (!) 138/59, pulse (!) 56, temperature 97.9 F (36.6 C), temperature source Oral, resp. rate 18, height 5\' 5"  (1.651 m), weight 175 lb 12.8 oz (79.7 kg), SpO2 96 %.    HEENT: No thrush or ulcers. Resp: Lungs clear bilaterally. Cardio: Regular rate and rhythm. GI: No hepatosplenomegaly. Vascular: No leg edema. Musculoskeletal: Tender over the left upper buttock region and hip. Skin: No rash. Port-A-Cath without erythema.  Lab Results:  Lab Results  Component Value Date   WBC 4.6 01/31/2023   HGB 10.6 (L) 01/31/2023   HCT 32.6 (L) 01/31/2023   MCV 91.6 01/31/2023   PLT 147 (L) 01/31/2023   NEUTROABS 3.4 01/31/2023    Imaging:  No results found.  Medications: I have reviewed the patient's current medications.  Assessment/Plan: Non-small cell lung cancer MRI lumbar spine 04/29/2019- enlarging marrow lesions involving the L1 vertebral body, upper left sacrum and right iliac bone MRI pelvis 04/29/2019- 3.5 cm left iliac bone lesion appears slightly larger; other similar appearing lesions present within the left superior pubic ramus, left superior abdomen acetabulum and upper left sacrum Kappa free light chains with mild elevation 05/12/2019  CTs 05/12/2019- left lower lobe pulmonary mass 3.3 x 3.2 cm; lytic process left iliac bone; spinal lesions; 1.1 cm low-density left kidney lesion; right thyroid enlargement with heterogeneous appearance with potential for multiple discrete lesions Biopsy left lower lobe lung mass 05/26/2019-poorly  differentiated carcinoma; positive for cytokeratin 5/6, p63 and TTF-1, no EGFR, BRAF, ALK, ERBB2,ROS, or NTRK alteration Cycle 1 carboplatin/Alimta/pembrolizumab 06/06/2019 Cycle 2 carboplatin/Alimta/pembrolizumab 06/27/2019 Cycle 3 carboplatin/Alimta/pembrolizumab 07/18/2019 Cycle 4 carboplatin/Alimta/pembrolizumab 08/07/2019 CTs 08/27/2019-significant decrease in size of lobulated mass left lower lobe.  Unchanged appearance of subtle bone lesions. Cycle 5 Alimta/pembrolizumab 08/28/2019 Cycle 6 Alimta/pembrolizumab 09/18/2019 Cycle 7 Alimta/pembrolizumab 10/09/2019 Cycle 8 Alimta/pembrolizumab 10/30/2019 Cycle 9 Alimta/pembrolizumab 11/20/2019 CTs 12/09/2019-no evidence of disease progression, left lower lobe nodule slightly decreased in size, stable L1, left sacral, and left pubic ramus metastases Cycle 10 Alimta/pembrolizumab 12/11/2019 Cycle 11 pembrolizumab alone 01/02/2020 (Alimta held due to edema, tenderness, erythema at the lower legs) Cycle 12 pembrolizumab 01/23/2020 Cycle 13 pembrolizumab 02/12/2020 Cycle 14 pembrolizumab 03/04/2020 CTs 03/18/2020-stable left lower lobe lesion, mild sclerosis at the superior endplate of L1 that was previously hypermetabolic, stable small left upper sacral lucent lesion, previous left superior pubic ramus lesion is occult on the CT, CT head negative for malignancy Cycle 15 pembrolizumab 03/25/2020 Cycle 16 pembrolizumab 04/21/2020 Cycle 17 pembrolizumab 05/17/2020 05/19/2020 bone scan-no definite abnormalities to suggest osseous metastases.  Areas of concern on prior PET-CT involving left iliac bone and L1 vertebral body showed no abnormalities on the current study Cycle 18 Pembrolizumab 06/10/2020 Cycle 19 Pembrolizumab 06/30/2020 CTs 07/16/2020-stable left lower lobe nodule, stable faint superior L1 vertebral lesion, no evidence of disease progression Cycle 20 pembrolizumab 07/21/2020 Cycle 21 Pembrolizumab 08/11/2020 Cycle 22 pembrolizumab 09/01/2020 Cycle 23  Pembrolizumab 09/22/2020 Cycle 24 pembrolizumab 10/13/2020 Cycle 25 Pembrolizumab 11/03/2020 Cycle 26 pembrolizumab 11/26/2020 CTs 12/15/2020- stable left lower lobe mass, stable sclerotic lesion at L2, no evidence of disease progression  Cycle 27 pembrolizumab 12/17/2020 PET scan 01/03/2021-1.8 x 1.3 cm left lower lobe nodule similar in size to CT of 12/15/2020 and measures smaller than previous PET/CT from 2020.  Nodule is markedly hypermetabolic.  No evidence for hypermetabolic hilar or mediastinal lymphadenopathy.  Several tiny foci of hypermetabolism identified in bony anatomy raising concern for skeletal metastases.  Comparison of the PET to CT 07/16/2020-lobular left lower lobe pulmonary nodule measured 2.4 x 1.5 cm on the prior study, current study it measured 1.8 x 1.3 cm.  Lesion in the anterior left acetabulum is similar to the 07/16/2020 exam although overlying cortical thinning slightly more pronounced on the current study.  Described lesion in the scapula shows some cortical sclerosis and a tiny central marrow lucency not substantially changed compared to 07/16/2020.  Left third rib lesion shows heterogeneous mineralization similar to 07/16/2020. MRI of cervical spine 01/13/2021-increased left facet edema at C5-C6, severe facet arthrosis on the left at C7-T1 and on the right at C3-C4, no evidence of metastatic disease Cycle 28 Pembrolizumab 01/14/2021 Cycle 29 Pembrolizumab 02/03/2021 Cycle 30 Pembrolizumab 02/24/2021 MRI left hip 03/02/2021-compared to 04/29/2019, slight increase in size of metastases at the left iliac and left acetabulum.  Lesion at the left upper sacrum is less conspicuous and a lesion at the left superior pubic ramus is stable Cycle 31 pembrolizumab 03/17/2021 Radiation to left acetabulum and left iliac 03/30/2021-04/13/2021 Cycle 32 pembrolizumab 04/07/2021 Cycle 33 Pembrolizumab 04/28/2021 Cycle 34 pembrolizumab 05/18/2021 PET scan 06/01/2021-no significant change in size or degree of FDG  uptake associated with FDG avid left lower lobe lung nodule compatible with neoplasm.  Stable to improved appearance of multifocal FDG avid bone metastasis. Cycle 35 pembrolizumab 06/10/2021 Cycle 36 pembrolizumab 07/01/2021 Cycle 37 pembrolizumab 07/29/2021 Cycle 38 pembrolizumab 08/19/2021 Cycle 39 Pembrolizumab 09/09/2021 Cycle 40 pembrolizumab 09/30/2021 CT chest 10/20/2021 to evaluate complaints of chest pain-no PE.  Interval increase in left lower lobe mass. Cycle 41 Pembrolizumab 10/21/2021 Cycle 42 pembrolizumab 11/11/2021 PET 11/24/2021-mild increase in size of left lower lobe mass, no evidence of solid organ or nodal metastases, stable multifocal bone metastases.  No new sites of metastatic disease. Cycle 43 pembrolizumab 11/29/2021 Cycle 45 Pembrolizumab 12/20/2021 Cycle 46 Pembrolizumab 01/09/2022 Cycle 47 pembrolizumab 02/01/2022 Cycle 48 Pembrolizumab 02/24/2022 Cycle 49 Pembrolizumab 03/17/2022 Cycle 50 pembrolizumab 04/07/2022 MRI l pelvis 04/12/2022-"new "lesion at the left superior acetabulum, stable bone lesions at the left suprapubic ramus, left S1, and left iliac Cycle 51 pembrolizumab 04/28/2022 Palliative radiation to the left hip and left scapula 04/26/2022-05/09/2022 Cycle 52 pembrolizumab 05/18/2022 PET 06/02/2022-stable left lower lobe tumor, slight increase in hypermetabolism associated with the left acetabular metastasis, additional previously seen bone lesions no longer have hypermetabolism Cycle 53 pembrolizumab 06/08/2022 Cycle 54 pembrolizumab 06/29/2022 Cycle 55 pembrolizumab 07/20/2022 Cycle 56 pembrolizumab 08/10/2022 Cycle 57 pembrolizumab 08/31/2022 Cycle 58 pembrolizumab 09/21/2022 Cycle 59 pembrolizumab 10/12/2022 Cycle 60 Pembrolizumab 11/02/2022 Cycle 61 pembrolizumab 11/23/2022 Cycle 62 Pembrolizumab 12/21/2022 PET scan 12/29/2022-mild increase in size and activity of the left lower lobe mass Cycle 63 Pembrolizumab 01/11/2023 Cycle 64 Pembrolizumab 01/31/2023 Pain secondary  to metastatic lung cancer Chronic back pain Type 2 diabetes Essential hypertension CAD Hyperlipidemia Family history significant for multiple members with breast cancer Grade 1 skin rash 07/18/2019 likely related to immunotherapy.  Topical steroid cream as needed. E. coli urinary tract infection 07/14/2019.  Completed cephalexin. Edema/tenderness at the right greater than left ankle 10/21/2019-etiology unclear, potentially related to systemic therapy or an infection, doxycycline prescribed-improved 10/23/2019; marked improvement 11/20/2019; at office visit 01/02/2020 she  reports worsening of lower extremity edema, pain/tenderness, erythema 3 to 4 days following each treatment.  Alimta held 01/02/2020.  Referral to dermatology. COVID-19 infection 03/30/2020, monoclonal antibody therapy 04/06/2020 Arthritis, possibly related to Pembrolizumab, improved since beginning Medrol, now followed by Dr. Dierdre Forth Pain left femoral head/upper femur/groin-negative plain x-ray; CT pelvis 03/28/2022-no acute appearing bone abnormality at the left hip or involving the proximal left femur, no evidence of acute fracture or dislocation, soft tissues left hip and left groin unremarkable; osseous mets within the left sacrum, left iliac bone and anterior acetabulum without evidence of associated fracture or dislocation. Percutaneous fixation of left pathologic acetabular fracture 08/23/2022 CT pelvis and left hip 11/02/2022-subacute nondisplaced fracture through the supra-acetabular left ilium extending through the acetabular roof, placement of 2 screws within the left ilium extending to the supra-acetabular region with an additional screw traversing the left superior pubic ramus, subtle sclerosis in the left ilium and superior left acetabulum, no new site of metastatic disease.      Disposition: [name] appears unchanged.  She is currently being treated with pembrolizumab every 3 weeks.  She is tolerating well.  There is no  clinical evidence of disease progression.  Plan to continue the same.  CBC and chemistry panel reviewed.  Labs adequate to proceed as above.  For the persistent left hip/groin pain she understands she can increase hydrocodone to 1 to 2 tablets every 4-6 hours as needed.    She is having difficulty sleeping and experiencing other symptoms of depression.  She will increase trazodone from 100 mg at bedtime to 150 mg at bedtime.  She will return for an office visit and treatment as scheduled in 3 weeks.  Lonna Cobb ANP/GNP-BC   01/31/2023  2:57 PM

## 2023-02-02 ENCOUNTER — Other Ambulatory Visit: Payer: Self-pay

## 2023-02-02 DIAGNOSIS — C3492 Malignant neoplasm of unspecified part of left bronchus or lung: Secondary | ICD-10-CM

## 2023-02-05 ENCOUNTER — Encounter: Payer: Self-pay | Admitting: Nurse Practitioner

## 2023-02-05 ENCOUNTER — Telehealth: Payer: Self-pay

## 2023-02-05 NOTE — Telephone Encounter (Signed)
CSW attempted to contact patient per the request of Lonna Cobb, NP.  Left vm.

## 2023-02-06 ENCOUNTER — Inpatient Hospital Stay: Payer: Medicare Other | Admitting: Licensed Clinical Social Worker

## 2023-02-06 NOTE — Progress Notes (Signed)
[facility] Clinical Social Work  Initial Assessment   [name] is a 88 y.o. year old female contacted by phone. Clinical Social Work was referred by medical provider for assessment of psychosocial needs.   SDOH (Social Determinants of Health) assessments performed: Yes SDOH Interventions    Flowsheet Row Social Work from 03/18/2021 in [facility] at [facility]  SDOH Interventions   Food Insecurity Interventions Intervention Not Indicated  Housing Interventions Intervention Not Indicated  Transportation Interventions Intervention Not Indicated       SDOH Screenings   Food Insecurity: No Food Insecurity (03/18/2021)  Housing: Low Risk  (03/18/2021)  Transportation Needs: No Transportation Needs (03/18/2021)  Tobacco Use: Medium Risk (01/31/2023)     Distress Screen completed: No    04/20/2022    9:08 AM  ONCBCN DISTRESS SCREENING  Distress experienced in past week (1-10) 10  Emotional problem type Nervousness/Anxiety;Adjusting to illness      Family/Social Information:  Housing Arrangement: patient lives alone.  She has placement secured at [facility] when she is ready. Family members/support persons in your life? Family, Friends, and Social worker.  Her husband of 66 years died 8 weeks ago.  She reports  grieving.  Patient has been a member of [employer] for 45 years. Transportation concerns: no  Employment: Retired  Income source: Actor concerns: No Type of concern: None Food access concerns: no Religious or spiritual practice: Yes-Patient identifies as International aid/development worker. Services Currently in place:  [facility] Medicare  Coping/ Adjustment to diagnosis: Patient understands treatment plan and what happens next? yes Concerns about diagnosis and/or treatment: I'm not especially worried about anything Patient reported stressors: Depression Hopes and/or priorities: Family Patient enjoys time with family/ friends Current coping  skills/ strengths: Average or above average intelligence , Communication skills , Contractor , General fund of knowledge , and Supportive family/friends     SUMMARY: Current SDOH Barriers:  Mental Health Concerns   Clinical Social Work Clinical Goal(s):  Demonstrate a reduction in symptoms related to :Grief   Interventions: Discussed common feeling and emotions when being diagnosed with cancer, and the importance of support during treatment Informed patient of the support team roles and support services at [facility] Provided CSW contact information and encouraged patient to call with any questions or concerns Provided patient with information about counseling available for patient.  CSW to contact [facility] Referral  per her request.   Follow Up Plan: CSW will follow-up with patient by phone  Patient verbalizes understanding of plan: Yes    Dorothey Baseman, LCSW Clinical Social Worker [facility]

## 2023-02-07 ENCOUNTER — Telehealth: Payer: Self-pay | Admitting: *Deleted

## 2023-02-07 ENCOUNTER — Telehealth: Payer: Self-pay | Admitting: Nurse Practitioner

## 2023-02-07 ENCOUNTER — Other Ambulatory Visit: Payer: Self-pay | Admitting: Nurse Practitioner

## 2023-02-07 DIAGNOSIS — C3492 Malignant neoplasm of unspecified part of left bronchus or lung: Secondary | ICD-10-CM

## 2023-02-07 MED ORDER — HYDROCODONE-ACETAMINOPHEN 10-325 MG PO TABS
1.0000 | ORAL_TABLET | ORAL | 0 refills | Status: DC | PRN
Start: 2023-02-07 — End: 2023-03-14

## 2023-02-07 NOTE — Telephone Encounter (Signed)
I called Ms. Flicker to follow-up on pain control.  She is taking 1 hydrocodone tablet every 3-5 hours (10/325).  She has decided to begin the Duragesic 25 mcg patch every 3 days.  We discussed trying oxycodone or MSIR.  She prefers to continue hydrocodone.  We reviewed dosing instructions of 1 tablet every 4 hours as needed.  She will contact the office early next week with an update on pain control.

## 2023-02-07 NOTE — Telephone Encounter (Signed)
Mrs. Redinger called to report pain still not under control. She is taking hydrocodone #1 tablet every 3-5 hours and has not started the fentanyl 25 mcg patch out of concern for addiction.  Encouraged her to start this patch and supplement w/prn hydrocodone and she should not have any fear of addiction. She agrees to fill the script now. Called [facility] and confirmed it is still on file and is still valid script. They will fill for her.

## 2023-02-17 ENCOUNTER — Other Ambulatory Visit: Payer: Self-pay | Admitting: Cardiovascular Disease

## 2023-02-18 ENCOUNTER — Other Ambulatory Visit: Payer: Self-pay | Admitting: Oncology

## 2023-02-18 DIAGNOSIS — C3492 Malignant neoplasm of unspecified part of left bronchus or lung: Secondary | ICD-10-CM

## 2023-02-20 ENCOUNTER — Encounter: Payer: Self-pay | Admitting: Oncology

## 2023-02-21 ENCOUNTER — Inpatient Hospital Stay: Payer: Medicare Other

## 2023-02-21 ENCOUNTER — Inpatient Hospital Stay (HOSPITAL_BASED_OUTPATIENT_CLINIC_OR_DEPARTMENT_OTHER): Payer: Medicare Other | Admitting: Oncology

## 2023-02-21 ENCOUNTER — Inpatient Hospital Stay: Payer: Medicare Other | Admitting: Nutrition

## 2023-02-21 VITALS — BP 140/64 | HR 91 | Temp 98.1°F | Resp 18 | Ht 65.0 in | Wt 171.6 lb

## 2023-02-21 VITALS — BP 122/42 | HR 54

## 2023-02-21 DIAGNOSIS — Z5112 Encounter for antineoplastic immunotherapy: Secondary | ICD-10-CM | POA: Diagnosis not present

## 2023-02-21 DIAGNOSIS — C3492 Malignant neoplasm of unspecified part of left bronchus or lung: Secondary | ICD-10-CM | POA: Diagnosis not present

## 2023-02-21 LAB — CBC WITH DIFFERENTIAL (CANCER CENTER ONLY)
Abs Immature Granulocytes: 0.01 10*3/uL (ref 0.00–0.07)
Basophils Absolute: 0 10*3/uL (ref 0.0–0.1)
Basophils Relative: 0 %
Eosinophils Absolute: 0.1 10*3/uL (ref 0.0–0.5)
Eosinophils Relative: 1 %
HCT: 33.8 % — ABNORMAL LOW (ref 36.0–46.0)
Hemoglobin: 10.8 g/dL — ABNORMAL LOW (ref 12.0–15.0)
Immature Granulocytes: 0 %
Lymphocytes Relative: 8 %
Lymphs Abs: 0.5 10*3/uL — ABNORMAL LOW (ref 0.7–4.0)
MCH: 29.3 pg (ref 26.0–34.0)
MCHC: 32 g/dL (ref 30.0–36.0)
MCV: 91.6 fL (ref 80.0–100.0)
Monocytes Absolute: 0.4 10*3/uL (ref 0.1–1.0)
Monocytes Relative: 8 %
Neutro Abs: 4.8 10*3/uL (ref 1.7–7.7)
Neutrophils Relative %: 83 %
Platelet Count: 156 10*3/uL (ref 150–400)
RBC: 3.69 MIL/uL — ABNORMAL LOW (ref 3.87–5.11)
RDW: 13.5 % (ref 11.5–15.5)
WBC Count: 5.8 10*3/uL (ref 4.0–10.5)
nRBC: 0 % (ref 0.0–0.2)

## 2023-02-21 LAB — CMP (CANCER CENTER ONLY)
ALT: 14 U/L (ref 0–44)
AST: 19 U/L (ref 15–41)
Albumin: 4.2 g/dL (ref 3.5–5.0)
Alkaline Phosphatase: 70 U/L (ref 38–126)
Anion gap: 9 (ref 5–15)
BUN: 26 mg/dL — ABNORMAL HIGH (ref 8–23)
CO2: 26 mmol/L (ref 22–32)
Calcium: 9.9 mg/dL (ref 8.9–10.3)
Chloride: 103 mmol/L (ref 98–111)
Creatinine: 1.06 mg/dL — ABNORMAL HIGH (ref 0.44–1.00)
GFR, Estimated: 51 mL/min — ABNORMAL LOW (ref >60)
Glucose, Bld: 118 mg/dL — ABNORMAL HIGH (ref 70–99)
Potassium: 4 mmol/L (ref 3.5–5.1)
Sodium: 138 mmol/L (ref 135–145)
Total Bilirubin: 0.4 mg/dL (ref 0.3–1.2)
Total Protein: 7 g/dL (ref 6.5–8.1)

## 2023-02-21 LAB — TSH: TSH: 0.736 u[IU]/mL (ref 0.350–4.500)

## 2023-02-21 MED ORDER — HEPARIN SOD (PORK) LOCK FLUSH 100 UNIT/ML IV SOLN
500.0000 [IU] | Freq: Once | INTRAVENOUS | Status: AC | PRN
  Administered 2023-02-21: 500 [IU]

## 2023-02-21 MED ORDER — TRAZODONE HCL 50 MG PO TABS: 50.0000 mg | ORAL_TABLET | Freq: Every day | ORAL | 3 refills | Status: DC

## 2023-02-21 MED ORDER — TRAZODONE HCL 100 MG PO TABS: 100.0000 mg | ORAL_TABLET | Freq: Every day | ORAL | 3 refills | Status: DC

## 2023-02-21 MED ORDER — SODIUM CHLORIDE 0.9% FLUSH
10.0000 mL | INTRAVENOUS | Status: DC | PRN
  Administered 2023-02-21: 10 mL

## 2023-02-21 MED ORDER — SODIUM CHLORIDE 0.9 % IV SOLN
200.0000 mg | Freq: Once | INTRAVENOUS | Status: AC
  Administered 2023-02-21: 200 mg via INTRAVENOUS
  Filled 2023-02-21: qty 8

## 2023-02-21 MED ORDER — FENTANYL 25 MCG/HR TD PT72
1.0000 | MEDICATED_PATCH | TRANSDERMAL | 0 refills | Status: DC
Start: 2023-02-21 — End: 2023-03-02

## 2023-02-21 MED ORDER — SODIUM CHLORIDE 0.9 % IV SOLN: Freq: Once | INTRAVENOUS | Status: AC

## 2023-02-21 NOTE — Progress Notes (Signed)
88 year old female diagnosed with NSCLC and followed by Dr. Truett Perna. She receives Pembrolizumab.  PMH includes DM2, Chronic back pain, CAD, and HLD.  Medications include Xanax, Magnesium, Glucophage, Medrol, MVI, Mira lax, Crestor, Zinc.  Labs include Glucose 118, BUN 26 and Creatinine 1.06.  Height: 5\' 5" . Weight: 171 pounds 10 oz. UBW: 180-185 pounds per patient. BMI: 28.56.  Patient reports she is eating "differently" since her husband passed away. She doesn't fix meals anymore. She snacks throughout the day but doesn't really eat scheduled meals. It is hard to cook for one person and not enjoyable to eat alone. States she likes nuts and peanut butter. She will drink Boost but likes Premier protein and will substitute this for a meal at times. She also drinks Smoothies.  Nutrition Diagnosis: Unintended weight loss related to inadequate oral intake as evidenced by ~8% loss from UBW.  Intervention: Educated on importance of small, frequent snacks throughout the day. Reviewed options and provided nutrition handouts. Encouraged change to higher calorie supplement such as Boost Plus in addition to [employer]. Fact sheets provided.  Monitoring, Evaluation, Goals: Tolerate increased calories and protein to minimize wt loss.  Next Visit: Wednesday, August 14, during infusion.

## 2023-02-21 NOTE — Patient Instructions (Signed)

## 2023-02-21 NOTE — Progress Notes (Signed)
[name] OFFICE PROGRESS NOTE   Diagnosis: Non-small cell lung cancer  INTERVAL HISTORY:   [name] completed another cycle of pembrolizumab on 01/31/2023.  No rash or diarrhea she reports good appetite.  She relates weight loss to the recent death of her husband.  She is not cooking as often.  She plans to move into a retirement community.  [name] has persistent pain at the left groin and upper leg.  The pain is partially relieved with a Duragesic patch.  She takes hydrocodone during the day and at night.  The pain is chiefly in the groin and upper leg.  The pain limits her ability to ambulate.  Objective:  Vital signs in last 24 hours:  Blood pressure (!) 140/64, pulse 91, temperature 98.1 F (36.7 C), temperature source Oral, resp. rate 18, height 5\' 5"  (1.651 m), weight 171 lb 9.6 oz (77.8 kg), SpO2 98%.    Lymphatics: No inguinal nodes Resp: Lungs clear bilaterally Cardio: Regular rate and rhythm GI: No hepatosplenomegaly, tender in the left groin without a mass Vascular: No leg edema Musculoskeletal: Pain with motion at the left hip  Portacath/PICC-without erythema  Lab Results:  Lab Results  Component Value Date   WBC 5.8 02/21/2023   HGB 10.8 (L) 02/21/2023   HCT 33.8 (L) 02/21/2023   MCV 91.6 02/21/2023   PLT 156 02/21/2023   NEUTROABS 4.8 02/21/2023    CMP  Lab Results  Component Value Date   NA 138 02/21/2023   K 4.0 02/21/2023   CL 103 02/21/2023   CO2 26 02/21/2023   GLUCOSE 118 (H) 02/21/2023   BUN 26 (H) 02/21/2023   CREATININE 1.06 (H) 02/21/2023   CALCIUM 9.9 02/21/2023   PROT 7.0 02/21/2023   ALBUMIN 4.2 02/21/2023   AST 19 02/21/2023   ALT 14 02/21/2023   ALKPHOS 70 02/21/2023   BILITOT 0.4 02/21/2023   GFRNONAA 51 (L) 02/21/2023   GFRAA >60 04/21/2020    Medications: I have reviewed the patient's current medications.   Assessment/Plan: Non-small cell lung cancer MRI lumbar spine 04/29/2019- enlarging  marrow lesions involving the L1 vertebral body, upper left sacrum and right iliac bone MRI pelvis 04/29/2019- 3.5 cm left iliac bone lesion appears slightly larger; other similar appearing lesions present within the left superior pubic ramus, left superior abdomen acetabulum and upper left sacrum Kappa free light chains with mild elevation 05/12/2019  CTs 05/12/2019- left lower lobe pulmonary mass 3.3 x 3.2 cm; lytic process left iliac bone; spinal lesions; 1.1 cm low-density left kidney lesion; right thyroid enlargement with heterogeneous appearance with potential for multiple discrete lesions Biopsy left lower lobe lung mass 05/26/2019-poorly differentiated carcinoma; positive for cytokeratin 5/6, p63 and TTF-1, no EGFR, BRAF, ALK, ERBB2,ROS, or NTRK alteration Cycle 1 carboplatin/Alimta/pembrolizumab 06/06/2019 Cycle 2 carboplatin/Alimta/pembrolizumab 06/27/2019 Cycle 3 carboplatin/Alimta/pembrolizumab 07/18/2019 Cycle 4 carboplatin/Alimta/pembrolizumab 08/07/2019 CTs 08/27/2019-significant decrease in size of lobulated mass left lower lobe.  Unchanged appearance of subtle bone lesions. Cycle 5 Alimta/pembrolizumab 08/28/2019 Cycle 6 Alimta/pembrolizumab 09/18/2019 Cycle 7 Alimta/pembrolizumab 10/09/2019 Cycle 8 Alimta/pembrolizumab 10/30/2019 Cycle 9 Alimta/pembrolizumab 11/20/2019 CTs 12/09/2019-no evidence of disease progression, left lower lobe nodule slightly decreased in size, stable L1, left sacral, and left pubic ramus metastases Cycle 10 Alimta/pembrolizumab 12/11/2019 Cycle 11 pembrolizumab alone 01/02/2020 (Alimta held due to edema, tenderness, erythema at the lower legs) Cycle 12 pembrolizumab 01/23/2020 Cycle 13 pembrolizumab 02/12/2020 Cycle 14 pembrolizumab 03/04/2020 CTs 03/18/2020-stable left lower lobe lesion, mild sclerosis at the superior endplate of L1 that was  previously hypermetabolic, stable small left upper sacral lucent lesion, previous left superior pubic ramus lesion is occult on the  CT, CT head negative for malignancy Cycle 15 pembrolizumab 03/25/2020 Cycle 16 pembrolizumab 04/21/2020 Cycle 17 pembrolizumab 05/17/2020 05/19/2020 bone scan-no definite abnormalities to suggest osseous metastases.  Areas of concern on prior PET-CT involving left iliac bone and L1 vertebral body showed no abnormalities on the current study Cycle 18 Pembrolizumab 06/10/2020 Cycle 19 Pembrolizumab 06/30/2020 CTs 07/16/2020-stable left lower lobe nodule, stable faint superior L1 vertebral lesion, no evidence of disease progression Cycle 20 pembrolizumab 07/21/2020 Cycle 21 Pembrolizumab 08/11/2020 Cycle 22 pembrolizumab 09/01/2020 Cycle 23 Pembrolizumab 09/22/2020 Cycle 24 pembrolizumab 10/13/2020 Cycle 25 Pembrolizumab 11/03/2020 Cycle 26 pembrolizumab 11/26/2020 CTs 12/15/2020- stable left lower lobe mass, stable sclerotic lesion at L2, no evidence of disease progression Cycle 27 pembrolizumab 12/17/2020 PET scan 01/03/2021-1.8 x 1.3 cm left lower lobe nodule similar in size to CT of 12/15/2020 and measures smaller than previous PET/CT from 2020.  Nodule is markedly hypermetabolic.  No evidence for hypermetabolic hilar or mediastinal lymphadenopathy.  Several tiny foci of hypermetabolism identified in bony anatomy raising concern for skeletal metastases.  Comparison of the PET to CT 07/16/2020-lobular left lower lobe pulmonary nodule measured 2.4 x 1.5 cm on the prior study, current study it measured 1.8 x 1.3 cm.  Lesion in the anterior left acetabulum is similar to the 07/16/2020 exam although overlying cortical thinning slightly more pronounced on the current study.  Described lesion in the scapula shows some cortical sclerosis and a tiny central marrow lucency not substantially changed compared to 07/16/2020.  Left third rib lesion shows heterogeneous mineralization similar to 07/16/2020. MRI of cervical spine 01/13/2021-increased left facet edema at C5-C6, severe facet arthrosis on the left at C7-T1 and on the  right at C3-C4, no evidence of metastatic disease Cycle 28 Pembrolizumab 01/14/2021 Cycle 29 Pembrolizumab 02/03/2021 Cycle 30 Pembrolizumab 02/24/2021 MRI left hip 03/02/2021-compared to 04/29/2019, slight increase in size of metastases at the left iliac and left acetabulum.  Lesion at the left upper sacrum is less conspicuous and a lesion at the left superior pubic ramus is stable Cycle 31 pembrolizumab 03/17/2021 Radiation to left acetabulum and left iliac 03/30/2021-04/13/2021 Cycle 32 pembrolizumab 04/07/2021 Cycle 33 Pembrolizumab 04/28/2021 Cycle 34 pembrolizumab 05/18/2021 PET scan 06/01/2021-no significant change in size or degree of FDG uptake associated with FDG avid left lower lobe lung nodule compatible with neoplasm.  Stable to improved appearance of multifocal FDG avid bone metastasis. Cycle 35 pembrolizumab 06/10/2021 Cycle 36 pembrolizumab 07/01/2021 Cycle 37 pembrolizumab 07/29/2021 Cycle 38 pembrolizumab 08/19/2021 Cycle 39 Pembrolizumab 09/09/2021 Cycle 40 pembrolizumab 09/30/2021 CT chest 10/20/2021 to evaluate complaints of chest pain-no PE.  Interval increase in left lower lobe mass. Cycle 41 Pembrolizumab 10/21/2021 Cycle 42 pembrolizumab 11/11/2021 PET 11/24/2021-mild increase in size of left lower lobe mass, no evidence of solid organ or nodal metastases, stable multifocal bone metastases.  No new sites of metastatic disease. Cycle 43 pembrolizumab 11/29/2021 Cycle 45 Pembrolizumab 12/20/2021 Cycle 46 Pembrolizumab 01/09/2022 Cycle 47 pembrolizumab 02/01/2022 Cycle 48 Pembrolizumab 02/24/2022 Cycle 49 Pembrolizumab 03/17/2022 Cycle 50 pembrolizumab 04/07/2022 MRI l pelvis 04/12/2022-"new "lesion at the left superior acetabulum, stable bone lesions at the left suprapubic ramus, left S1, and left iliac Cycle 51 pembrolizumab 04/28/2022 Palliative radiation to the left hip and left scapula 04/26/2022-05/09/2022 Cycle 52 pembrolizumab 05/18/2022 PET 06/02/2022-stable left lower lobe tumor, slight  increase in hypermetabolism associated with the left acetabular metastasis, additional previously seen bone lesions no longer have hypermetabolism Cycle  53 pembrolizumab 06/08/2022 Cycle 54 pembrolizumab 06/29/2022 Cycle 55 pembrolizumab 07/20/2022 Cycle 56 pembrolizumab 08/10/2022 Cycle 57 pembrolizumab 08/31/2022 Cycle 58 pembrolizumab 09/21/2022 Cycle 59 pembrolizumab 10/12/2022 Cycle 60 Pembrolizumab 11/02/2022 Cycle 61 pembrolizumab 11/23/2022 Cycle 62 Pembrolizumab 12/21/2022 PET scan 12/29/2022-mild increase in size and activity of the left lower lobe mass Cycle 63 Pembrolizumab 01/11/2023 Cycle 64 Pembrolizumab 01/31/2023 Cycle 65 pembrolizumab 02/21/2023 Pain secondary to metastatic lung cancer Chronic back pain Type 2 diabetes Essential hypertension CAD Hyperlipidemia Family history significant for multiple members with breast cancer Grade 1 skin rash 07/18/2019 likely related to immunotherapy.  Topical steroid cream as needed. E. coli urinary tract infection 07/14/2019.  Completed cephalexin. Edema/tenderness at the right greater than left ankle 10/21/2019-etiology unclear, potentially related to systemic therapy or an infection, doxycycline prescribed-improved 10/23/2019; marked improvement 11/20/2019; at office visit 01/02/2020 she reports worsening of lower extremity edema, pain/tenderness, erythema 3 to 4 days following each treatment.  Alimta held 01/02/2020.  Referral to dermatology. COVID-19 infection 03/30/2020, monoclonal antibody therapy 04/06/2020 Arthritis, possibly related to Pembrolizumab, improved since beginning Medrol, now followed by Dr. Dierdre Forth Pain left femoral head/upper femur/groin-negative plain x-ray; CT pelvis 03/28/2022-no acute appearing bone abnormality at the left hip or involving the proximal left femur, no evidence of acute fracture or dislocation, soft tissues left hip and left groin unremarkable; osseous mets within the left sacrum, left iliac bone and anterior acetabulum  without evidence of associated fracture or dislocation. Percutaneous fixation of left pathologic acetabular fracture 08/23/2022 CT pelvis and left hip 11/02/2022-subacute nondisplaced fracture through the supra-acetabular left ilium extending through the acetabular roof, placement of 2 screws within the left ilium extending to the supra-acetabular region with an additional screw traversing the left superior pubic ramus, subtle sclerosis in the left ilium and superior left acetabulum, no new site of metastatic disease.        Disposition: [name] appears stable.  She continues to have pain in the left groin.  The pain is likely related to the pathologic left acetabular fracture.  She has undergone percutaneous fixation of the fracture and palliative radiation.  She will continue Duragesic and hydrocodone for pain.  I refilled her prescription for Duragesic. I will ask interventional radiology to review the pelvic images to see if she may benefit from an ablation procedure.  [name] will complete another cycle of pembrolizumab today.  She will return for an office visit in 3 weeks. Thornton Papas, MD  02/21/2023  10:33 AM

## 2023-02-21 NOTE — Patient Instructions (Signed)
[facility]   Discharge Instructions: Thank you for choosing [facility] to provide your oncology and hematology care.   If you have a lab appointment with the Cancer Center, please go directly to the Cancer Center and check in at the registration area.   Wear comfortable clothing and clothing appropriate for easy access to any Portacath or PICC line.   We strive to give you quality time with your provider. You may need to reschedule your appointment if you arrive late (15 or more minutes).  Arriving late affects you and other patients whose appointments are after yours.  Also, if you miss three or more appointments without notifying the office, you may be dismissed from the clinic at the provider's discretion.      For prescription refill requests, have your pharmacy contact our office and allow 72 hours for refills to be completed.    Today you received the following chemotherapy and/or immunotherapy agents Keytruda       To help prevent nausea and vomiting after your treatment, we encourage you to take your nausea medication as directed.  BELOW ARE SYMPTOMS THAT SHOULD BE REPORTED IMMEDIATELY: *FEVER GREATER THAN 100.4 F (38 C) OR HIGHER *CHILLS OR SWEATING *NAUSEA AND VOMITING THAT IS NOT CONTROLLED WITH YOUR NAUSEA MEDICATION *UNUSUAL SHORTNESS OF BREATH *UNUSUAL BRUISING OR BLEEDING *URINARY PROBLEMS (pain or burning when urinating, or frequent urination) *BOWEL PROBLEMS (unusual diarrhea, constipation, pain near the anus) TENDERNESS IN MOUTH AND THROAT WITH OR WITHOUT PRESENCE OF ULCERS (sore throat, sores in mouth, or a toothache) UNUSUAL RASH, SWELLING OR PAIN  UNUSUAL VAGINAL DISCHARGE OR ITCHING   Items with * indicate a potential emergency and should be followed up as soon as possible or go to the Emergency Department if any problems should occur.  Please show the CHEMOTHERAPY ALERT CARD or IMMUNOTHERAPY ALERT CARD at  check-in to the Emergency Department and triage nurse.  Should you have questions after your visit or need to cancel or reschedule your appointment, please contact [facility]  Dept: [phone]  and follow the prompts.  Office hours are 8:00 a.m. to 4:30 p.m. Monday - Friday. Please note that voicemails left after 4:00 p.m. may not be returned until the following business day.  We are closed weekends and major holidays. You have access to a nurse at all times for urgent questions. Please call the main number to the clinic Dept: [phone] and follow the prompts.   For any non-urgent questions, you may also contact your provider using MyChart. We now offer e-Visits for anyone 18 and older to request care online for non-urgent symptoms. For details visit [email or web address].   Also download the MyChart app! Go to the app store, search "MyChart", open the app, select [facility], and log in with your MyChart username and password.  Pembrolizumab Injection What is this medication? PEMBROLIZUMAB (PEM broe LIZ ue mab) treats some types of cancer. It works by helping your immune system slow or stop the spread of cancer cells. It is a monoclonal antibody. This medicine may be used for other purposes; ask your health care provider or pharmacist if you have questions. COMMON BRAND NAME(S): Keytruda What should I tell my care team before I take this medication? They need to know if you have any of these conditions: Allogeneic stem cell transplant (uses someone else's stem cells) Autoimmune diseases, such as Crohn disease, ulcerative colitis, lupus History of chest radiation Nervous   system problems, such as Guillain-Barre syndrome, myasthenia gravis Organ transplant An unusual or allergic reaction to pembrolizumab, other medications, foods, dyes, or preservatives Pregnant or trying to get pregnant Breast-feeding How should I use this medication? This medication  is injected into a vein. It is given by your care team in a hospital or clinic setting. A special MedGuide will be given to you before each treatment. Be sure to read this information carefully each time. Talk to your care team about the use of this medication in children. While it may be prescribed for children as young as 6 months for selected conditions, precautions do apply. Overdosage: If you think you have taken too much of this medicine contact a poison control center or emergency room at once. NOTE: This medicine is only for you. Do not share this medicine with others. What if I miss a dose? Keep appointments for follow-up doses. It is important not to miss your dose. Call your care team if you are unable to keep an appointment. What may interact with this medication? Interactions have not been studied. This list may not describe all possible interactions. Give your health care provider a list of all the medicines, herbs, non-prescription drugs, or dietary supplements you use. Also tell them if you smoke, drink alcohol, or use illegal drugs. Some items may interact with your medicine. What should I watch for while using this medication? Your condition will be monitored carefully while you are receiving this medication. You may need blood work while taking this medication. This medication may cause serious skin reactions. They can happen weeks to months after starting the medication. Contact your care team right away if you notice fevers or flu-like symptoms with a rash. The rash may be red or purple and then turn into blisters or peeling of the skin. You may also notice a red rash with swelling of the face, lips, or lymph nodes in your neck or under your arms. Tell your care team right away if you have any change in your eyesight. Talk to your care team if you may be pregnant. Serious birth defects can occur if you take this medication during pregnancy and for 4 months after the last dose. You  will need a negative pregnancy test before starting this medication. Contraception is recommended while taking this medication and for 4 months after the last dose. Your care team can help you find the option that works for you. Do not breastfeed while taking this medication and for 4 months after the last dose. What side effects may I notice from receiving this medication? Side effects that you should report to your care team as soon as possible: Allergic reactions--skin rash, itching, hives, swelling of the face, lips, tongue, or throat Dry cough, shortness of breath or trouble breathing Eye pain, redness, irritation, or discharge with blurry or decreased vision Heart muscle inflammation--unusual weakness or fatigue, shortness of breath, chest pain, fast or irregular heartbeat, dizziness, swelling of the ankles, feet, or hands Hormone gland problems--headache, sensitivity to light, unusual weakness or fatigue, dizziness, fast or irregular heartbeat, increased sensitivity to cold or heat, excessive sweating, constipation, hair loss, increased thirst or amount of urine, tremors or shaking, irritability Infusion reactions--chest pain, shortness of breath or trouble breathing, feeling faint or lightheaded Kidney injury (glomerulonephritis)--decrease in the amount of urine, red or dark brown urine, foamy or bubbly urine, swelling of the ankles, hands, or feet Liver injury--right upper belly pain, loss of appetite, nausea, light-colored stool, dark yellow or   brown urine, yellowing skin or eyes, unusual weakness or fatigue Pain, tingling, or numbness in the hands or feet, muscle weakness, change in vision, confusion or trouble speaking, loss of balance or coordination, trouble walking, seizures Rash, fever, and swollen lymph nodes Redness, blistering, peeling, or loosening of the skin, including inside the mouth Sudden or severe stomach pain, bloody diarrhea, fever, nausea, vomiting Side effects that  usually do not require medical attention (report to your care team if they continue or are bothersome): Bone, joint, or muscle pain Diarrhea Fatigue Loss of appetite Nausea Skin rash This list may not describe all possible side effects. Call your doctor for medical advice about side effects. You may report side effects to FDA at 1-800-FDA-1088. Where should I keep my medication? This medication is given in a hospital or clinic. It will not be stored at home. NOTE: This sheet is a summary. It may not cover all possible information. If you have questions about this medicine, talk to your doctor, pharmacist, or health care provider.  2024 Elsevier/Gold Standard (2021-11-29 00:00:00)   

## 2023-02-21 NOTE — Progress Notes (Signed)
Patient seen by Dr. Sherrill today ? ?Vitals are within treatment parameters. ? ?Labs reviewed by Dr. Sherrill and are within treatment parameters. ? ?Per physician team, patient is ready for treatment and there are NO modifications to the treatment plan.  ?

## 2023-02-22 ENCOUNTER — Encounter: Payer: Self-pay | Admitting: Oncology

## 2023-02-22 LAB — T4: T4, Total: 7.4 ug/dL (ref 4.5–12.0)

## 2023-02-26 ENCOUNTER — Encounter: Payer: Self-pay | Admitting: Oncology

## 2023-03-02 ENCOUNTER — Inpatient Hospital Stay: Payer: Medicare Other | Attending: Oncology

## 2023-03-02 ENCOUNTER — Inpatient Hospital Stay (HOSPITAL_BASED_OUTPATIENT_CLINIC_OR_DEPARTMENT_OTHER): Payer: Medicare Other | Admitting: Nurse Practitioner

## 2023-03-02 ENCOUNTER — Inpatient Hospital Stay: Payer: Medicare Other

## 2023-03-02 ENCOUNTER — Other Ambulatory Visit: Payer: Self-pay | Admitting: *Deleted

## 2023-03-02 ENCOUNTER — Telehealth: Payer: Self-pay | Admitting: *Deleted

## 2023-03-02 DIAGNOSIS — Z5112 Encounter for antineoplastic immunotherapy: Secondary | ICD-10-CM | POA: Insufficient documentation

## 2023-03-02 DIAGNOSIS — C3432 Malignant neoplasm of lower lobe, left bronchus or lung: Secondary | ICD-10-CM | POA: Diagnosis present

## 2023-03-02 DIAGNOSIS — I959 Hypotension, unspecified: Secondary | ICD-10-CM | POA: Diagnosis not present

## 2023-03-02 DIAGNOSIS — C3492 Malignant neoplasm of unspecified part of left bronchus or lung: Secondary | ICD-10-CM

## 2023-03-02 DIAGNOSIS — C7951 Secondary malignant neoplasm of bone: Secondary | ICD-10-CM | POA: Insufficient documentation

## 2023-03-02 DIAGNOSIS — E860 Dehydration: Secondary | ICD-10-CM

## 2023-03-02 LAB — CMP (CANCER CENTER ONLY)
ALT: 12 U/L (ref 0–44)
AST: 16 U/L (ref 15–41)
Albumin: 4.1 g/dL (ref 3.5–5.0)
Alkaline Phosphatase: 76 U/L (ref 38–126)
Anion gap: 6 (ref 5–15)
BUN: 27 mg/dL — ABNORMAL HIGH (ref 8–23)
CO2: 28 mmol/L (ref 22–32)
Calcium: 10.2 mg/dL (ref 8.9–10.3)
Chloride: 101 mmol/L (ref 98–111)
Creatinine: 1.25 mg/dL — ABNORMAL HIGH (ref 0.44–1.00)
GFR, Estimated: 42 mL/min — ABNORMAL LOW (ref >60)
Glucose, Bld: 115 mg/dL — ABNORMAL HIGH (ref 70–99)
Potassium: 4.5 mmol/L (ref 3.5–5.1)
Sodium: 135 mmol/L (ref 135–145)
Total Bilirubin: 0.4 mg/dL (ref 0.3–1.2)
Total Protein: 6.7 g/dL (ref 6.5–8.1)

## 2023-03-02 LAB — CORTISOL: Cortisol, Plasma: 3.6 ug/dL

## 2023-03-02 MED ORDER — HEPARIN SOD (PORK) LOCK FLUSH 100 UNIT/ML IV SOLN
500.0000 [IU] | INTRAVENOUS | Status: AC | PRN
  Administered 2023-03-02: 500 [IU]

## 2023-03-02 MED ORDER — SODIUM CHLORIDE 0.9% FLUSH
10.0000 mL | INTRAVENOUS | Status: AC | PRN
  Administered 2023-03-02: 10 mL

## 2023-03-02 MED ORDER — FENTANYL 12 MCG/HR TD PT72
1.0000 | MEDICATED_PATCH | TRANSDERMAL | 0 refills | Status: DC
Start: 2023-03-02 — End: 2023-04-07

## 2023-03-02 MED ORDER — SODIUM CHLORIDE 0.9 % IV SOLN: INTRAVENOUS | Status: AC

## 2023-03-02 NOTE — Progress Notes (Signed)
[facility] OFFICE PROGRESS NOTE   Diagnosis: Non-small cell lung cancer.  INTERVAL HISTORY:   [name] completed another cycle of Pembrolizumab 02/21/2023.  She reports mild intermittent nausea for several months.  The increased a few days ago.  No vomiting.  She also notes frequent soft bowel movements over the past few days.  No fever.  No bleeding.  No abdominal pain.  She is tolerating fluids.  Main complaint is left hip pain.  She removed the Duragesic 25 mcg patch because she did not like how it made her feel.  She continues hydrocodone.  Objective:  Vital signs in last 24 hours:  Temperature 98.3, heart rate 57, rations 18 blood pressure 98/48, oxygen saturation 95%    HEENT: No thrush, moist. GI: Abdomen soft, nontender.  No mass. Vascular: No leg edema. Neuro: Alert and oriented.    Lab Results:  Lab Results  Component Value Date   WBC 5.8 02/21/2023   HGB 10.8 (L) 02/21/2023   HCT 33.8 (L) 02/21/2023   MCV 91.6 02/21/2023   PLT 156 02/21/2023   NEUTROABS 4.8 02/21/2023    Imaging:  No results found.  Medications: I have reviewed the patient's current medications.  Assessment/Plan: Non-small cell lung cancer MRI lumbar spine 04/29/2019- enlarging marrow lesions involving the L1 vertebral body, upper left sacrum and right iliac bone MRI pelvis 04/29/2019- 3.5 cm left iliac bone lesion appears slightly larger; other similar appearing lesions present within the left superior pubic ramus, left superior abdomen acetabulum and upper left sacrum Kappa free light chains with mild elevation 05/12/2019  CTs 05/12/2019- left lower lobe pulmonary mass 3.3 x 3.2 cm; lytic process left iliac bone; spinal lesions; 1.1 cm low-density left kidney lesion; right thyroid enlargement with heterogeneous appearance with potential for multiple discrete lesions Biopsy left lower lobe lung mass 05/26/2019-poorly differentiated carcinoma; positive for cytokeratin 5/6,  p63 and TTF-1, no EGFR, BRAF, ALK, ERBB2,ROS, or NTRK alteration Cycle 1 carboplatin/Alimta/pembrolizumab 06/06/2019 Cycle 2 carboplatin/Alimta/pembrolizumab 06/27/2019 Cycle 3 carboplatin/Alimta/pembrolizumab 07/18/2019 Cycle 4 carboplatin/Alimta/pembrolizumab 08/07/2019 CTs 08/27/2019-significant decrease in size of lobulated mass left lower lobe.  Unchanged appearance of subtle bone lesions. Cycle 5 Alimta/pembrolizumab 08/28/2019 Cycle 6 Alimta/pembrolizumab 09/18/2019 Cycle 7 Alimta/pembrolizumab 10/09/2019 Cycle 8 Alimta/pembrolizumab 10/30/2019 Cycle 9 Alimta/pembrolizumab 11/20/2019 CTs 12/09/2019-no evidence of disease progression, left lower lobe nodule slightly decreased in size, stable L1, left sacral, and left pubic ramus metastases Cycle 10 Alimta/pembrolizumab 12/11/2019 Cycle 11 pembrolizumab alone 01/02/2020 (Alimta held due to edema, tenderness, erythema at the lower legs) Cycle 12 pembrolizumab 01/23/2020 Cycle 13 pembrolizumab 02/12/2020 Cycle 14 pembrolizumab 03/04/2020 CTs 03/18/2020-stable left lower lobe lesion, mild sclerosis at the superior endplate of L1 that was previously hypermetabolic, stable small left upper sacral lucent lesion, previous left superior pubic ramus lesion is occult on the CT, CT head negative for malignancy Cycle 15 pembrolizumab 03/25/2020 Cycle 16 pembrolizumab 04/21/2020 Cycle 17 pembrolizumab 05/17/2020 05/19/2020 bone scan-no definite abnormalities to suggest osseous metastases.  Areas of concern on prior PET-CT involving left iliac bone and L1 vertebral body showed no abnormalities on the current study Cycle 18 Pembrolizumab 06/10/2020 Cycle 19 Pembrolizumab 06/30/2020 CTs 07/16/2020-stable left lower lobe nodule, stable faint superior L1 vertebral lesion, no evidence of disease progression Cycle 20 pembrolizumab 07/21/2020 Cycle 21 Pembrolizumab 08/11/2020 Cycle 22 pembrolizumab 09/01/2020 Cycle 23 Pembrolizumab 09/22/2020 Cycle 24 pembrolizumab  10/13/2020 Cycle 25 Pembrolizumab 11/03/2020 Cycle 26 pembrolizumab 11/26/2020 CTs 12/15/2020- stable left lower lobe mass, stable sclerotic lesion at L2, no evidence of disease progression Cycle  27 pembrolizumab 12/17/2020 PET scan 01/03/2021-1.8 x 1.3 cm left lower lobe nodule similar in size to CT of 12/15/2020 and measures smaller than previous PET/CT from 2020.  Nodule is markedly hypermetabolic.  No evidence for hypermetabolic hilar or mediastinal lymphadenopathy.  Several tiny foci of hypermetabolism identified in bony anatomy raising concern for skeletal metastases.  Comparison of the PET to CT 07/16/2020-lobular left lower lobe pulmonary nodule measured 2.4 x 1.5 cm on the prior study, current study it measured 1.8 x 1.3 cm.  Lesion in the anterior left acetabulum is similar to the 07/16/2020 exam although overlying cortical thinning slightly more pronounced on the current study.  Described lesion in the scapula shows some cortical sclerosis and a tiny central marrow lucency not substantially changed compared to 07/16/2020.  Left third rib lesion shows heterogeneous mineralization similar to 07/16/2020. MRI of cervical spine 01/13/2021-increased left facet edema at C5-C6, severe facet arthrosis on the left at C7-T1 and on the right at C3-C4, no evidence of metastatic disease Cycle 28 Pembrolizumab 01/14/2021 Cycle 29 Pembrolizumab 02/03/2021 Cycle 30 Pembrolizumab 02/24/2021 MRI left hip 03/02/2021-compared to 04/29/2019, slight increase in size of metastases at the left iliac and left acetabulum.  Lesion at the left upper sacrum is less conspicuous and a lesion at the left superior pubic ramus is stable Cycle 31 pembrolizumab 03/17/2021 Radiation to left acetabulum and left iliac 03/30/2021-04/13/2021 Cycle 32 pembrolizumab 04/07/2021 Cycle 33 Pembrolizumab 04/28/2021 Cycle 34 pembrolizumab 05/18/2021 PET scan 06/01/2021-no significant change in size or degree of FDG uptake associated with FDG avid left lower lobe  lung nodule compatible with neoplasm.  Stable to improved appearance of multifocal FDG avid bone metastasis. Cycle 35 pembrolizumab 06/10/2021 Cycle 36 pembrolizumab 07/01/2021 Cycle 37 pembrolizumab 07/29/2021 Cycle 38 pembrolizumab 08/19/2021 Cycle 39 Pembrolizumab 09/09/2021 Cycle 40 pembrolizumab 09/30/2021 CT chest 10/20/2021 to evaluate complaints of chest pain-no PE.  Interval increase in left lower lobe mass. Cycle 41 Pembrolizumab 10/21/2021 Cycle 42 pembrolizumab 11/11/2021 PET 11/24/2021-mild increase in size of left lower lobe mass, no evidence of solid organ or nodal metastases, stable multifocal bone metastases.  No new sites of metastatic disease. Cycle 43 pembrolizumab 11/29/2021 Cycle 45 Pembrolizumab 12/20/2021 Cycle 46 Pembrolizumab 01/09/2022 Cycle 47 pembrolizumab 02/01/2022 Cycle 48 Pembrolizumab 02/24/2022 Cycle 49 Pembrolizumab 03/17/2022 Cycle 50 pembrolizumab 04/07/2022 MRI l pelvis 04/12/2022-"new "lesion at the left superior acetabulum, stable bone lesions at the left suprapubic ramus, left S1, and left iliac Cycle 51 pembrolizumab 04/28/2022 Palliative radiation to the left hip and left scapula 04/26/2022-05/09/2022 Cycle 52 pembrolizumab 05/18/2022 PET 06/02/2022-stable left lower lobe tumor, slight increase in hypermetabolism associated with the left acetabular metastasis, additional previously seen bone lesions no longer have hypermetabolism Cycle 53 pembrolizumab 06/08/2022 Cycle 54 pembrolizumab 06/29/2022 Cycle 55 pembrolizumab 07/20/2022 Cycle 56 pembrolizumab 08/10/2022 Cycle 57 pembrolizumab 08/31/2022 Cycle 58 pembrolizumab 09/21/2022 Cycle 59 pembrolizumab 10/12/2022 Cycle 60 Pembrolizumab 11/02/2022 Cycle 61 pembrolizumab 11/23/2022 Cycle 62 Pembrolizumab 12/21/2022 PET scan 12/29/2022-mild increase in size and activity of the left lower lobe mass Cycle 63 Pembrolizumab 01/11/2023 Cycle 64 Pembrolizumab 01/31/2023 Cycle 65 pembrolizumab 02/21/2023 Pain secondary to metastatic  lung cancer Chronic back pain Type 2 diabetes Essential hypertension CAD Hyperlipidemia Family history significant for multiple members with breast cancer Grade 1 skin rash 07/18/2019 likely related to immunotherapy.  Topical steroid cream as needed. E. coli urinary tract infection 07/14/2019.  Completed cephalexin. Edema/tenderness at the right greater than left ankle 10/21/2019-etiology unclear, potentially related to systemic therapy or an infection, doxycycline prescribed-improved 10/23/2019; marked improvement 11/20/2019; at office  visit 01/02/2020 she reports worsening of lower extremity edema, pain/tenderness, erythema 3 to 4 days following each treatment.  Alimta held 01/02/2020.  Referral to dermatology. COVID-19 infection 03/30/2020, monoclonal antibody therapy 04/06/2020 Arthritis, possibly related to Pembrolizumab, improved since beginning Medrol, now followed by Dr. Dierdre Forth Pain left femoral head/upper femur/groin-negative plain x-ray; CT pelvis 03/28/2022-no acute appearing bone abnormality at the left hip or involving the proximal left femur, no evidence of acute fracture or dislocation, soft tissues left hip and left groin unremarkable; osseous mets within the left sacrum, left iliac bone and anterior acetabulum without evidence of associated fracture or dislocation. Percutaneous fixation of left pathologic acetabular fracture 08/23/2022 CT pelvis and left hip 11/02/2022-subacute nondisplaced fracture through the supra-acetabular left ilium extending through the acetabular roof, placement of 2 screws within the left ilium extending to the supra-acetabular region with an additional screw traversing the left superior pubic ramus, subtle sclerosis in the left ilium and superior left acetabulum, no new site of metastatic disease.    Disposition: [name] appears unchanged.  She is on active treatment with Pembrolizumab every 3 weeks.  She presents today with consistent nausea and frequent bowel  movements over the past several days.  Main complaint continues to be left hip pain.  She was mildly hypotensive.  We reviewed the chemistry panel.  Creatinine with slight elevation.  She received a liter of IV fluids with improvement in the blood pressure.  She will continue to work on increasing fluid intake at home.  If she develops frequent watery bowel movements she will submit a specimen for C. difficile testing.  She agrees to try the Duragesic 12 mcg patch and will continue hydrocodone as needed.  She will return for follow-up as scheduled.  We are available to see her sooner if needed.  Patient seen with Dr. Truett Perna.   Lonna Cobb ANP/GNP-BC   03/02/2023  3:59 PM  This was a shared visit with Lonna Cobb.  [name] was interviewed and examined.  She presents for an unscheduled visit with complaints of persistent left hip pain, nausea, and failure to thrive.  The pain is likely secondary to the pathologic left acetabular fracture.  I reviewed the case with interventional radiology when she was last here.  There is no option for an ablation procedure.  The etiology of the nausea is unclear.  She will return a stool sample if she develops watery diarrhea.  We adjusted the narcotic analgesic regimen.  I was present for greater than 50% of today's visit.  I performed medical decision making.  Mancel Bale, MD

## 2023-03-02 NOTE — Patient Instructions (Signed)

## 2023-03-02 NOTE — Telephone Encounter (Addendum)
Daughter called to report significant decline in her status. Very weak, not eating or drinking much, maybe 1/2 quart/day fluids. Has also had diarrhea 3-4 day, but just started her Imodium today. No fever or vomiting. Took zofran today as well. Stopped using her pain patch on 7/27 citing it makes her feel  horrible and "out of my head" and too sedated. Naturally, pain is worse. Is back to Hydrocodone #1 tablet every 4 hours.  Patient is asking for IV fluids today.

## 2023-03-02 NOTE — Telephone Encounter (Signed)
Informed daughter to bring her in today at 1 pm for fluids and Dr. Truett Perna may try to check on her while here. MD thinks that home palliative referral would be worthwhile as well.

## 2023-03-02 NOTE — Progress Notes (Signed)
[name] is asking for pain management referral. After discussion she has agree to Palliative Team referral to manage her pain. She would like them to come to her home. Referral order placed.

## 2023-03-04 ENCOUNTER — Other Ambulatory Visit: Payer: Self-pay | Admitting: Nurse Practitioner

## 2023-03-05 NOTE — Telephone Encounter (Signed)
Still has #3 refills on script sent on 7/24

## 2023-03-06 ENCOUNTER — Encounter: Payer: Self-pay | Admitting: Oncology

## 2023-03-10 ENCOUNTER — Other Ambulatory Visit: Payer: Self-pay | Admitting: Cardiovascular Disease

## 2023-03-10 ENCOUNTER — Other Ambulatory Visit: Payer: Self-pay | Admitting: Oncology

## 2023-03-10 DIAGNOSIS — C3492 Malignant neoplasm of unspecified part of left bronchus or lung: Secondary | ICD-10-CM

## 2023-03-14 ENCOUNTER — Encounter: Payer: Self-pay | Admitting: Nurse Practitioner

## 2023-03-14 ENCOUNTER — Inpatient Hospital Stay: Payer: Medicare Other

## 2023-03-14 ENCOUNTER — Inpatient Hospital Stay: Payer: Medicare Other | Admitting: Nutrition

## 2023-03-14 ENCOUNTER — Other Ambulatory Visit: Payer: Self-pay | Admitting: *Deleted

## 2023-03-14 ENCOUNTER — Inpatient Hospital Stay (HOSPITAL_BASED_OUTPATIENT_CLINIC_OR_DEPARTMENT_OTHER): Payer: Medicare Other | Admitting: Nurse Practitioner

## 2023-03-14 VITALS — BP 116/61 | HR 91 | Temp 98.1°F | Resp 18 | Ht 65.0 in | Wt 171.0 lb

## 2023-03-14 VITALS — BP 141/58 | HR 60 | Resp 18

## 2023-03-14 DIAGNOSIS — C3492 Malignant neoplasm of unspecified part of left bronchus or lung: Secondary | ICD-10-CM

## 2023-03-14 DIAGNOSIS — Z5112 Encounter for antineoplastic immunotherapy: Secondary | ICD-10-CM | POA: Diagnosis not present

## 2023-03-14 LAB — CMP (CANCER CENTER ONLY)
ALT: 11 U/L (ref 0–44)
AST: 14 U/L — ABNORMAL LOW (ref 15–41)
Albumin: 3.9 g/dL (ref 3.5–5.0)
Alkaline Phosphatase: 74 U/L (ref 38–126)
Anion gap: 7 (ref 5–15)
BUN: 33 mg/dL — ABNORMAL HIGH (ref 8–23)
CO2: 26 mmol/L (ref 22–32)
Calcium: 9.8 mg/dL (ref 8.9–10.3)
Chloride: 103 mmol/L (ref 98–111)
Creatinine: 1.03 mg/dL — ABNORMAL HIGH (ref 0.44–1.00)
GFR, Estimated: 53 mL/min — ABNORMAL LOW (ref >60)
Glucose, Bld: 106 mg/dL — ABNORMAL HIGH (ref 70–99)
Potassium: 4.2 mmol/L (ref 3.5–5.1)
Sodium: 136 mmol/L (ref 135–145)
Total Bilirubin: 0.4 mg/dL (ref 0.3–1.2)
Total Protein: 6.8 g/dL (ref 6.5–8.1)

## 2023-03-14 LAB — CBC WITH DIFFERENTIAL (CANCER CENTER ONLY)
Abs Immature Granulocytes: 0.01 10*3/uL (ref 0.00–0.07)
Basophils Absolute: 0 10*3/uL (ref 0.0–0.1)
Basophils Relative: 0 %
Eosinophils Absolute: 0.1 10*3/uL (ref 0.0–0.5)
Eosinophils Relative: 2 %
HCT: 30.2 % — ABNORMAL LOW (ref 36.0–46.0)
Hemoglobin: 9.7 g/dL — ABNORMAL LOW (ref 12.0–15.0)
Immature Granulocytes: 0 %
Lymphocytes Relative: 13 %
Lymphs Abs: 0.6 10*3/uL — ABNORMAL LOW (ref 0.7–4.0)
MCH: 29.4 pg (ref 26.0–34.0)
MCHC: 32.1 g/dL (ref 30.0–36.0)
MCV: 91.5 fL (ref 80.0–100.0)
Monocytes Absolute: 0.4 10*3/uL (ref 0.1–1.0)
Monocytes Relative: 10 %
Neutro Abs: 3.4 10*3/uL (ref 1.7–7.7)
Neutrophils Relative %: 75 %
Platelet Count: 173 10*3/uL (ref 150–400)
RBC: 3.3 MIL/uL — ABNORMAL LOW (ref 3.87–5.11)
RDW: 13.7 % (ref 11.5–15.5)
WBC Count: 4.6 10*3/uL (ref 4.0–10.5)
nRBC: 0 % (ref 0.0–0.2)

## 2023-03-14 MED ORDER — TRAZODONE HCL 50 MG PO TABS: 50.0000 mg | ORAL_TABLET | Freq: Every day | ORAL | 0 refills | Status: DC

## 2023-03-14 MED ORDER — TRAZODONE HCL 100 MG PO TABS: 100.0000 mg | ORAL_TABLET | Freq: Every day | ORAL | 0 refills | Status: DC

## 2023-03-14 MED ORDER — SODIUM CHLORIDE 0.9 % IV SOLN
200.0000 mg | Freq: Once | INTRAVENOUS | Status: AC
  Administered 2023-03-14: 200 mg via INTRAVENOUS
  Filled 2023-03-14: qty 8

## 2023-03-14 MED ORDER — HYDROCODONE-ACETAMINOPHEN 10-325 MG PO TABS: 1.0000 | ORAL_TABLET | ORAL | 0 refills | Status: DC | PRN

## 2023-03-14 MED ORDER — HEPARIN SOD (PORK) LOCK FLUSH 100 UNIT/ML IV SOLN
500.0000 [IU] | Freq: Once | INTRAVENOUS | Status: AC | PRN
  Administered 2023-03-14: 500 [IU]

## 2023-03-14 MED ORDER — SODIUM CHLORIDE 0.9 % IV SOLN: Freq: Once | INTRAVENOUS | Status: AC

## 2023-03-14 MED ORDER — SODIUM CHLORIDE 0.9% FLUSH
10.0000 mL | INTRAVENOUS | Status: DC | PRN
  Administered 2023-03-14: 10 mL

## 2023-03-14 NOTE — Progress Notes (Signed)
Patient seen by [name] NP today  Vitals are within treatment parameters.  Labs reviewed by [name] NP and are within treatment parameters.  Per physician team, patient is ready for treatment and there are NO modifications to the treatment plan.     

## 2023-03-14 NOTE — Telephone Encounter (Signed)
Patient requesting trazodone be filled via [employer]. Approved per NP

## 2023-03-14 NOTE — Progress Notes (Signed)
[facility] OFFICE PROGRESS NOTE   Diagnosis: Non-small cell lung cancer  INTERVAL HISTORY:   [name] returns as scheduled.  She completed another cycle of Pembrolizumab 03/02/2023.  She denies nausea/vomiting.  No diarrhea.  No rash.  She continues to have left hip pain.  This affects her mobility.  She is currently on a Duragesic patch 12 mcg every 3 days and takes 1 Vicodin tablet every 4 hours.  She denies other sites of pain.  She feels in general very weak.  Objective:  Vital signs in last 24 hours:  Blood pressure 116/61, pulse 91, temperature 98.1 F (36.7 C), temperature source Oral, resp. rate 18, height 5\' 5"  (1.651 m), weight 171 lb (77.6 kg), SpO2 97%.    HEENT: No thrush or ulcers. Resp: Lungs clear bilaterally. Cardio: Regular rate and rhythm. GI: Abdomen soft and nontender.  No hepatomegaly. Vascular: No leg edema. Neuro: Alert and oriented.  Follows commands.  Left leg weakness seems to be related to pain versus actual weakness.  Motor strength otherwise appears intact. Skin: No rash. Port-A-Cath without erythema.  Lab Results:  Lab Results  Component Value Date   WBC 4.6 03/14/2023   HGB 9.7 (L) 03/14/2023   HCT 30.2 (L) 03/14/2023   MCV 91.5 03/14/2023   PLT 173 03/14/2023   NEUTROABS 3.4 03/14/2023    Imaging:  No results found.  Medications: I have reviewed the patient's current medications.  Assessment/Plan: Non-small cell lung cancer MRI lumbar spine 04/29/2019- enlarging marrow lesions involving the L1 vertebral body, upper left sacrum and right iliac bone MRI pelvis 04/29/2019- 3.5 cm left iliac bone lesion appears slightly larger; other similar appearing lesions present within the left superior pubic ramus, left superior abdomen acetabulum and upper left sacrum Kappa free light chains with mild elevation 05/12/2019  CTs 05/12/2019- left lower lobe pulmonary mass 3.3 x 3.2 cm; lytic process left iliac bone; spinal lesions;  1.1 cm low-density left kidney lesion; right thyroid enlargement with heterogeneous appearance with potential for multiple discrete lesions Biopsy left lower lobe lung mass 05/26/2019-poorly differentiated carcinoma; positive for cytokeratin 5/6, p63 and TTF-1, no EGFR, BRAF, ALK, ERBB2,ROS, or NTRK alteration Cycle 1 carboplatin/Alimta/pembrolizumab 06/06/2019 Cycle 2 carboplatin/Alimta/pembrolizumab 06/27/2019 Cycle 3 carboplatin/Alimta/pembrolizumab 07/18/2019 Cycle 4 carboplatin/Alimta/pembrolizumab 08/07/2019 CTs 08/27/2019-significant decrease in size of lobulated mass left lower lobe.  Unchanged appearance of subtle bone lesions. Cycle 5 Alimta/pembrolizumab 08/28/2019 Cycle 6 Alimta/pembrolizumab 09/18/2019 Cycle 7 Alimta/pembrolizumab 10/09/2019 Cycle 8 Alimta/pembrolizumab 10/30/2019 Cycle 9 Alimta/pembrolizumab 11/20/2019 CTs 12/09/2019-no evidence of disease progression, left lower lobe nodule slightly decreased in size, stable L1, left sacral, and left pubic ramus metastases Cycle 10 Alimta/pembrolizumab 12/11/2019 Cycle 11 pembrolizumab alone 01/02/2020 (Alimta held due to edema, tenderness, erythema at the lower legs) Cycle 12 pembrolizumab 01/23/2020 Cycle 13 pembrolizumab 02/12/2020 Cycle 14 pembrolizumab 03/04/2020 CTs 03/18/2020-stable left lower lobe lesion, mild sclerosis at the superior endplate of L1 that was previously hypermetabolic, stable small left upper sacral lucent lesion, previous left superior pubic ramus lesion is occult on the CT, CT head negative for malignancy Cycle 15 pembrolizumab 03/25/2020 Cycle 16 pembrolizumab 04/21/2020 Cycle 17 pembrolizumab 05/17/2020 05/19/2020 bone scan-no definite abnormalities to suggest osseous metastases.  Areas of concern on prior PET-CT involving left iliac bone and L1 vertebral body showed no abnormalities on the current study Cycle 18 Pembrolizumab 06/10/2020 Cycle 19 Pembrolizumab 06/30/2020 CTs 07/16/2020-stable left lower lobe nodule,  stable faint superior L1 vertebral lesion, no evidence of disease progression Cycle 20 pembrolizumab 07/21/2020 Cycle 21 Pembrolizumab  08/11/2020 Cycle 22 pembrolizumab 09/01/2020 Cycle 23 Pembrolizumab 09/22/2020 Cycle 24 pembrolizumab 10/13/2020 Cycle 25 Pembrolizumab 11/03/2020 Cycle 26 pembrolizumab 11/26/2020 CTs 12/15/2020- stable left lower lobe mass, stable sclerotic lesion at L2, no evidence of disease progression Cycle 27 pembrolizumab 12/17/2020 PET scan 01/03/2021-1.8 x 1.3 cm left lower lobe nodule similar in size to CT of 12/15/2020 and measures smaller than previous PET/CT from 2020.  Nodule is markedly hypermetabolic.  No evidence for hypermetabolic hilar or mediastinal lymphadenopathy.  Several tiny foci of hypermetabolism identified in bony anatomy raising concern for skeletal metastases.  Comparison of the PET to CT 07/16/2020-lobular left lower lobe pulmonary nodule measured 2.4 x 1.5 cm on the prior study, current study it measured 1.8 x 1.3 cm.  Lesion in the anterior left acetabulum is similar to the 07/16/2020 exam although overlying cortical thinning slightly more pronounced on the current study.  Described lesion in the scapula shows some cortical sclerosis and a tiny central marrow lucency not substantially changed compared to 07/16/2020.  Left third rib lesion shows heterogeneous mineralization similar to 07/16/2020. MRI of cervical spine 01/13/2021-increased left facet edema at C5-C6, severe facet arthrosis on the left at C7-T1 and on the right at C3-C4, no evidence of metastatic disease Cycle 28 Pembrolizumab 01/14/2021 Cycle 29 Pembrolizumab 02/03/2021 Cycle 30 Pembrolizumab 02/24/2021 MRI left hip 03/02/2021-compared to 04/29/2019, slight increase in size of metastases at the left iliac and left acetabulum.  Lesion at the left upper sacrum is less conspicuous and a lesion at the left superior pubic ramus is stable Cycle 31 pembrolizumab 03/17/2021 Radiation to left acetabulum and left  iliac 03/30/2021-04/13/2021 Cycle 32 pembrolizumab 04/07/2021 Cycle 33 Pembrolizumab 04/28/2021 Cycle 34 pembrolizumab 05/18/2021 PET scan 06/01/2021-no significant change in size or degree of FDG uptake associated with FDG avid left lower lobe lung nodule compatible with neoplasm.  Stable to improved appearance of multifocal FDG avid bone metastasis. Cycle 35 pembrolizumab 06/10/2021 Cycle 36 pembrolizumab 07/01/2021 Cycle 37 pembrolizumab 07/29/2021 Cycle 38 pembrolizumab 08/19/2021 Cycle 39 Pembrolizumab 09/09/2021 Cycle 40 pembrolizumab 09/30/2021 CT chest 10/20/2021 to evaluate complaints of chest pain-no PE.  Interval increase in left lower lobe mass. Cycle 41 Pembrolizumab 10/21/2021 Cycle 42 pembrolizumab 11/11/2021 PET 11/24/2021-mild increase in size of left lower lobe mass, no evidence of solid organ or nodal metastases, stable multifocal bone metastases.  No new sites of metastatic disease. Cycle 43 pembrolizumab 11/29/2021 Cycle 45 Pembrolizumab 12/20/2021 Cycle 46 Pembrolizumab 01/09/2022 Cycle 47 pembrolizumab 02/01/2022 Cycle 48 Pembrolizumab 02/24/2022 Cycle 49 Pembrolizumab 03/17/2022 Cycle 50 pembrolizumab 04/07/2022 MRI l pelvis 04/12/2022-"new "lesion at the left superior acetabulum, stable bone lesions at the left suprapubic ramus, left S1, and left iliac Cycle 51 pembrolizumab 04/28/2022 Palliative radiation to the left hip and left scapula 04/26/2022-05/09/2022 Cycle 52 pembrolizumab 05/18/2022 PET 06/02/2022-stable left lower lobe tumor, slight increase in hypermetabolism associated with the left acetabular metastasis, additional previously seen bone lesions no longer have hypermetabolism Cycle 53 pembrolizumab 06/08/2022 Cycle 54 pembrolizumab 06/29/2022 Cycle 55 pembrolizumab 07/20/2022 Cycle 56 pembrolizumab 08/10/2022 Cycle 57 pembrolizumab 08/31/2022 Cycle 58 pembrolizumab 09/21/2022 Cycle 59 pembrolizumab 10/12/2022 Cycle 60 Pembrolizumab 11/02/2022 Cycle 61 pembrolizumab  11/23/2022 Cycle 62 Pembrolizumab 12/21/2022 PET scan 12/29/2022-mild increase in size and activity of the left lower lobe mass Cycle 63 Pembrolizumab 01/11/2023 Cycle 64 Pembrolizumab 01/31/2023 Cycle 65 pembrolizumab 02/21/2023 Cycle 66 Pembrolizumab 03/14/2023 Pain secondary to metastatic lung cancer Chronic back pain Type 2 diabetes Essential hypertension CAD Hyperlipidemia Family history significant for multiple members with breast cancer Grade 1 skin rash 07/18/2019 likely related  to immunotherapy.  Topical steroid cream as needed. E. coli urinary tract infection 07/14/2019.  Completed cephalexin. Edema/tenderness at the right greater than left ankle 10/21/2019-etiology unclear, potentially related to systemic therapy or an infection, doxycycline prescribed-improved 10/23/2019; marked improvement 11/20/2019; at office visit 01/02/2020 she reports worsening of lower extremity edema, pain/tenderness, erythema 3 to 4 days following each treatment.  Alimta held 01/02/2020.  Referral to dermatology. COVID-19 infection 03/30/2020, monoclonal antibody therapy 04/06/2020 Arthritis, possibly related to Pembrolizumab, improved since beginning Medrol, now followed by Dr. Dierdre Forth Pain left femoral head/upper femur/groin-negative plain x-ray; CT pelvis 03/28/2022-no acute appearing bone abnormality at the left hip or involving the proximal left femur, no evidence of acute fracture or dislocation, soft tissues left hip and left groin unremarkable; osseous mets within the left sacrum, left iliac bone and anterior acetabulum without evidence of associated fracture or dislocation. Percutaneous fixation of left pathologic acetabular fracture 08/23/2022 CT pelvis and left hip 11/02/2022-subacute nondisplaced fracture through the supra-acetabular left ilium extending through the acetabular roof, placement of 2 screws within the left ilium extending to the supra-acetabular region with an additional screw traversing the left  superior pubic ramus, subtle sclerosis in the left ilium and superior left acetabulum, no new site of metastatic disease.    Disposition: Ms. Kiesow appears unchanged.  She continues Pembrolizumab every 3 weeks.  There is no clinical evidence of disease progression.  Plan to proceed with Pembrolizumab today as scheduled.  Overall plan is for restaging studies following the next treatment in 3 weeks.  She and her daughter agree with this plan.  CBC and chemistry panel reviewed.  Labs adequate to proceed as above.  Etiology of the "weakness" is unclear.  Seems to be more of a global issue.  Question deconditioning.  Recent cortisol level was in normal range.  We will check TSH and T4 levels.  For pain control she will increase the Duragesic from a 12 mcg patch to a 25 mcg patch and continue Vicodin as needed for breakthrough pain.  She will return for follow-up and treatment as scheduled in 3 weeks.    Lonna Cobb ANP/GNP-BC   03/14/2023  11:09 AM

## 2023-03-14 NOTE — Patient Instructions (Signed)
[facility] AT [facility]   Discharge Instructions: Thank you for choosing [facility] to provide your oncology and hematology care.   If you have a lab appointment with the Cancer Center, please go directly to the Cancer Center and check in at the registration area.   Wear comfortable clothing and clothing appropriate for easy access to any Portacath or PICC line.   We strive to give you quality time with your provider. You may need to reschedule your appointment if you arrive late (15 or more minutes).  Arriving late affects you and other patients whose appointments are after yours.  Also, if you miss three or more appointments without notifying the office, you may be dismissed from the clinic at the provider's discretion.      For prescription refill requests, have your pharmacy contact our office and allow 72 hours for refills to be completed.    Today you received the following chemotherapy and/or immunotherapy agents Pembrolizumab (KEYTRUDA).      To help prevent nausea and vomiting after your treatment, we encourage you to take your nausea medication as directed.  BELOW ARE SYMPTOMS THAT SHOULD BE REPORTED IMMEDIATELY: *FEVER GREATER THAN 100.4 F (38 C) OR HIGHER *CHILLS OR SWEATING *NAUSEA AND VOMITING THAT IS NOT CONTROLLED WITH YOUR NAUSEA MEDICATION *UNUSUAL SHORTNESS OF BREATH *UNUSUAL BRUISING OR BLEEDING *URINARY PROBLEMS (pain or burning when urinating, or frequent urination) *BOWEL PROBLEMS (unusual diarrhea, constipation, pain near the anus) TENDERNESS IN MOUTH AND THROAT WITH OR WITHOUT PRESENCE OF ULCERS (sore throat, sores in mouth, or a toothache) UNUSUAL RASH, SWELLING OR PAIN  UNUSUAL VAGINAL DISCHARGE OR ITCHING   Items with * indicate a potential emergency and should be followed up as soon as possible or go to the Emergency Department if any problems should occur.  Please show the CHEMOTHERAPY ALERT CARD or IMMUNOTHERAPY  ALERT CARD at check-in to the Emergency Department and triage nurse.  Should you have questions after your visit or need to cancel or reschedule your appointment, please contact [facility]  Dept: [phone]  and follow the prompts.  Office hours are 8:00 a.m. to 4:30 p.m. Monday - Friday. Please note that voicemails left after 4:00 p.m. may not be returned until the following business day.  We are closed weekends and major holidays. You have access to a nurse at all times for urgent questions. Please call the main number to the clinic Dept: [phone] and follow the prompts.   For any non-urgent questions, you may also contact your provider using MyChart. We now offer e-Visits for anyone 6 and older to request care online for non-urgent symptoms. For details visit [email or web address].   Also download the MyChart app! Go to the app store, search "MyChart", open the app, select [place], and log in with your MyChart username and password.  Pembrolizumab Injection What is this medication? PEMBROLIZUMAB (PEM broe LIZ ue mab) treats some types of cancer. It works by helping your immune system slow or stop the spread of cancer cells. It is a monoclonal antibody. This medicine may be used for other purposes; ask your health care provider or pharmacist if you have questions. COMMON BRAND NAME(S): Keytruda What should I tell my care team before I take this medication? They need to know if you have any of these conditions: Allogeneic stem cell transplant (uses someone else's stem cells) Autoimmune diseases, such as Crohn disease, ulcerative colitis, lupus History of chest radiation Nervous  system problems, such as Guillain-Barre syndrome, myasthenia gravis Organ transplant An unusual or allergic reaction to pembrolizumab, other medications, foods, dyes, or preservatives Pregnant or trying to get pregnant Breast-feeding How should I use this  medication? This medication is injected into a vein. It is given by your care team in a hospital or clinic setting. A special MedGuide will be given to you before each treatment. Be sure to read this information carefully each time. Talk to your care team about the use of this medication in children. While it may be prescribed for children as young as 6 months for selected conditions, precautions do apply. Overdosage: If you think you have taken too much of this medicine contact a poison control center or emergency room at once. NOTE: This medicine is only for you. Do not share this medicine with others. What if I miss a dose? Keep appointments for follow-up doses. It is important not to miss your dose. Call your care team if you are unable to keep an appointment. What may interact with this medication? Interactions have not been studied. This list may not describe all possible interactions. Give your health care provider a list of all the medicines, herbs, non-prescription drugs, or dietary supplements you use. Also tell them if you smoke, drink alcohol, or use illegal drugs. Some items may interact with your medicine. What should I watch for while using this medication? Your condition will be monitored carefully while you are receiving this medication. You may need blood work while taking this medication. This medication may cause serious skin reactions. They can happen weeks to months after starting the medication. Contact your care team right away if you notice fevers or flu-like symptoms with a rash. The rash may be red or purple and then turn into blisters or peeling of the skin. You may also notice a red rash with swelling of the face, lips, or lymph nodes in your neck or under your arms. Tell your care team right away if you have any change in your eyesight. Talk to your care team if you may be pregnant. Serious birth defects can occur if you take this medication during pregnancy and for 4  months after the last dose. You will need a negative pregnancy test before starting this medication. Contraception is recommended while taking this medication and for 4 months after the last dose. Your care team can help you find the option that works for you. Do not breastfeed while taking this medication and for 4 months after the last dose. What side effects may I notice from receiving this medication? Side effects that you should report to your care team as soon as possible: Allergic reactions--skin rash, itching, hives, swelling of the face, lips, tongue, or throat Dry cough, shortness of breath or trouble breathing Eye pain, redness, irritation, or discharge with blurry or decreased vision Heart muscle inflammation--unusual weakness or fatigue, shortness of breath, chest pain, fast or irregular heartbeat, dizziness, swelling of the ankles, feet, or hands Hormone gland problems--headache, sensitivity to light, unusual weakness or fatigue, dizziness, fast or irregular heartbeat, increased sensitivity to cold or heat, excessive sweating, constipation, hair loss, increased thirst or amount of urine, tremors or shaking, irritability Infusion reactions--chest pain, shortness of breath or trouble breathing, feeling faint or lightheaded Kidney injury (glomerulonephritis)--decrease in the amount of urine, red or dark brown urine, foamy or bubbly urine, swelling of the ankles, hands, or feet Liver injury--right upper belly pain, loss of appetite, nausea, light-colored stool, dark yellow or  brown urine, yellowing skin or eyes, unusual weakness or fatigue Pain, tingling, or numbness in the hands or feet, muscle weakness, change in vision, confusion or trouble speaking, loss of balance or coordination, trouble walking, seizures Rash, fever, and swollen lymph nodes Redness, blistering, peeling, or loosening of the skin, including inside the mouth Sudden or severe stomach pain, bloody diarrhea, fever, nausea,  vomiting Side effects that usually do not require medical attention (report to your care team if they continue or are bothersome): Bone, joint, or muscle pain Diarrhea Fatigue Loss of appetite Nausea Skin rash This list may not describe all possible side effects. Call your doctor for medical advice about side effects. You may report side effects to FDA at 1-800-FDA-1088. Where should I keep my medication? This medication is given in a hospital or clinic. It will not be stored at home. NOTE: This sheet is a summary. It may not cover all possible information. If you have questions about this medicine, talk to your doctor, pharmacist, or health care provider.  2024 Elsevier/Gold Standard (2021-11-29 00:00:00)

## 2023-03-14 NOTE — Progress Notes (Signed)
Nutrition follow up completed with patient. She receives Keytruda for NSCLC and is followed by Dr. Truett Perna.  Weight stable at 171 pounds.  Labs noted: Glucoses 106, BUN 33, and Creatinine 1.03.  Reports she doesn't feel well but cannot explain why. Denies N/V/C/D. She feels weak and reports she is still in pain. Reports consuming 1-2 bottles of Premier Protein daily. Tries to eat small amounts often.  Nutrition Diagnosis: Unintended wt loss stable.  Intervention: Continue small, frequent meals/snacks with adequate calories and protein. Increase fluids. Continue ONS as needed. Provided support.  Monitoring, Evaluation, Goals: Tolerate increased calories and protein to minimize wt loss.  Next Visit: To be scheduled with upcoming treatment as needed.

## 2023-03-15 ENCOUNTER — Other Ambulatory Visit: Payer: Self-pay

## 2023-03-27 ENCOUNTER — Telehealth: Payer: Self-pay | Admitting: *Deleted

## 2023-03-27 NOTE — Telephone Encounter (Signed)
[name] reports sudden increase in left hip pain that started last night and is "unbearable" now. Rates it 10/10 and worsens with weight bearing, sitting. No position helps the pain. It is a "hard, ache" and is constant. Currently taking hydrocodone #1 every 4 hours and using Duragesic 25 mcg patch. Asking what can be done for her?

## 2023-03-27 NOTE — Telephone Encounter (Signed)
Per Dr. Truett Perna: Nothing that can be done in clinic for her pain. Suggests she take hydrocodone/apap #2 every 4 hours and call tomorrow with update. Other option is to go to emergency room if pain is severe.  Notified her daughter, [name] and she agrees to plan.

## 2023-03-28 ENCOUNTER — Telehealth: Payer: Self-pay | Admitting: *Deleted

## 2023-03-28 ENCOUNTER — Encounter: Payer: Self-pay | Admitting: Oncology

## 2023-03-28 NOTE — Telephone Encounter (Signed)
Per Dr. Truett Perna: continue current pain regimen and will evaluate on 04/04/23 visit. If still having this increased pain, will order CT pelvis. Notified her daughter. Patient is going to try alternating #2 tabs with #1 tablet to see if she can get by this this. If not, she will resume the #2 every 4 hours.

## 2023-03-28 NOTE — Telephone Encounter (Signed)
Mrs. Colom reports the increase to #2 hydrocodone-apap every 4 hours has helped the pain quite a lot, but she is not happy with feeling "loopy" when she takes it. Asking if provider has any thoughts on what should be done to further investigate this increase in pain?

## 2023-03-31 ENCOUNTER — Other Ambulatory Visit (HOSPITAL_COMMUNITY): Payer: Medicare Other

## 2023-03-31 ENCOUNTER — Emergency Department (HOSPITAL_COMMUNITY): Payer: Medicare Other

## 2023-03-31 ENCOUNTER — Inpatient Hospital Stay (HOSPITAL_COMMUNITY)
Admission: EM | Admit: 2023-03-31 | Discharge: 2023-04-07 | DRG: 682 | Disposition: A | Discharge status: Skilled Nursing Facility | Payer: Medicare Other | Attending: Family Medicine | Admitting: Family Medicine

## 2023-03-31 DIAGNOSIS — Z83719 Family history of colon polyps, unspecified: Secondary | ICD-10-CM

## 2023-03-31 DIAGNOSIS — Z923 Personal history of irradiation: Secondary | ICD-10-CM

## 2023-03-31 DIAGNOSIS — N179 Acute kidney failure, unspecified: Secondary | ICD-10-CM | POA: Diagnosis not present

## 2023-03-31 DIAGNOSIS — I2510 Atherosclerotic heart disease of native coronary artery without angina pectoris: Secondary | ICD-10-CM | POA: Diagnosis present

## 2023-03-31 DIAGNOSIS — E785 Hyperlipidemia, unspecified: Secondary | ICD-10-CM

## 2023-03-31 DIAGNOSIS — G4700 Insomnia, unspecified: Secondary | ICD-10-CM

## 2023-03-31 DIAGNOSIS — Z833 Family history of diabetes mellitus: Secondary | ICD-10-CM

## 2023-03-31 DIAGNOSIS — C7951 Secondary malignant neoplasm of bone: Secondary | ICD-10-CM

## 2023-03-31 DIAGNOSIS — K5900 Constipation, unspecified: Secondary | ICD-10-CM | POA: Diagnosis present

## 2023-03-31 DIAGNOSIS — Z7982 Long term (current) use of aspirin: Secondary | ICD-10-CM

## 2023-03-31 DIAGNOSIS — E119 Type 2 diabetes mellitus without complications: Secondary | ICD-10-CM

## 2023-03-31 DIAGNOSIS — M6282 Rhabdomyolysis: Secondary | ICD-10-CM | POA: Diagnosis present

## 2023-03-31 DIAGNOSIS — S80811A Abrasion, right lower leg, initial encounter: Secondary | ICD-10-CM | POA: Diagnosis present

## 2023-03-31 DIAGNOSIS — G893 Neoplasm related pain (acute) (chronic): Secondary | ICD-10-CM | POA: Diagnosis present

## 2023-03-31 DIAGNOSIS — L03115 Cellulitis of right lower limb: Secondary | ICD-10-CM | POA: Diagnosis present

## 2023-03-31 DIAGNOSIS — Z515 Encounter for palliative care: Secondary | ICD-10-CM

## 2023-03-31 DIAGNOSIS — Z803 Family history of malignant neoplasm of breast: Secondary | ICD-10-CM

## 2023-03-31 DIAGNOSIS — Z90710 Acquired absence of both cervix and uterus: Secondary | ICD-10-CM

## 2023-03-31 DIAGNOSIS — Z955 Presence of coronary angioplasty implant and graft: Secondary | ICD-10-CM

## 2023-03-31 DIAGNOSIS — Z7984 Long term (current) use of oral hypoglycemic drugs: Secondary | ICD-10-CM

## 2023-03-31 DIAGNOSIS — Z634 Disappearance and death of family member: Secondary | ICD-10-CM

## 2023-03-31 DIAGNOSIS — Z79899 Other long term (current) drug therapy: Secondary | ICD-10-CM

## 2023-03-31 DIAGNOSIS — I10 Essential (primary) hypertension: Secondary | ICD-10-CM | POA: Diagnosis not present

## 2023-03-31 DIAGNOSIS — S32492A Other specified fracture of left acetabulum, initial encounter for closed fracture: Secondary | ICD-10-CM | POA: Diagnosis present

## 2023-03-31 DIAGNOSIS — Z87891 Personal history of nicotine dependence: Secondary | ICD-10-CM

## 2023-03-31 DIAGNOSIS — F419 Anxiety disorder, unspecified: Secondary | ICD-10-CM

## 2023-03-31 DIAGNOSIS — Z820 Family history of epilepsy and other diseases of the nervous system: Secondary | ICD-10-CM

## 2023-03-31 DIAGNOSIS — S81801A Unspecified open wound, right lower leg, initial encounter: Secondary | ICD-10-CM

## 2023-03-31 DIAGNOSIS — Z66 Do not resuscitate: Secondary | ICD-10-CM | POA: Diagnosis present

## 2023-03-31 DIAGNOSIS — W1830XA Fall on same level, unspecified, initial encounter: Secondary | ICD-10-CM | POA: Diagnosis present

## 2023-03-31 DIAGNOSIS — Y9301 Activity, walking, marching and hiking: Secondary | ICD-10-CM | POA: Diagnosis present

## 2023-03-31 DIAGNOSIS — C3492 Malignant neoplasm of unspecified part of left bronchus or lung: Secondary | ICD-10-CM

## 2023-03-31 DIAGNOSIS — Z79620 Long term (current) use of immunosuppressive biologic: Secondary | ICD-10-CM

## 2023-03-31 DIAGNOSIS — Z8616 Personal history of COVID-19: Secondary | ICD-10-CM

## 2023-03-31 DIAGNOSIS — Z23 Encounter for immunization: Secondary | ICD-10-CM

## 2023-03-31 DIAGNOSIS — Z9221 Personal history of antineoplastic chemotherapy: Secondary | ICD-10-CM

## 2023-03-31 DIAGNOSIS — S32409S Unspecified fracture of unspecified acetabulum, sequela: Secondary | ICD-10-CM

## 2023-03-31 DIAGNOSIS — R296 Repeated falls: Secondary | ICD-10-CM | POA: Diagnosis present

## 2023-03-31 DIAGNOSIS — Z8249 Family history of ischemic heart disease and other diseases of the circulatory system: Secondary | ICD-10-CM

## 2023-03-31 DIAGNOSIS — Z9181 History of falling: Secondary | ICD-10-CM

## 2023-03-31 DIAGNOSIS — Z7409 Other reduced mobility: Secondary | ICD-10-CM | POA: Diagnosis present

## 2023-03-31 DIAGNOSIS — Z885 Allergy status to narcotic agent status: Secondary | ICD-10-CM

## 2023-03-31 DIAGNOSIS — Y92002 Bathroom of unspecified non-institutional (private) residence single-family (private) house as the place of occurrence of the external cause: Secondary | ICD-10-CM

## 2023-03-31 DIAGNOSIS — M80052A Age-related osteoporosis with current pathological fracture, left femur, initial encounter for fracture: Secondary | ICD-10-CM | POA: Diagnosis present

## 2023-03-31 DIAGNOSIS — G2581 Restless legs syndrome: Secondary | ICD-10-CM

## 2023-03-31 DIAGNOSIS — E88819 Insulin resistance, unspecified: Secondary | ICD-10-CM | POA: Diagnosis present

## 2023-03-31 LAB — BASIC METABOLIC PANEL
Anion gap: 14 (ref 5–15)
BUN: 43 mg/dL — ABNORMAL HIGH (ref 8–23)
CO2: 23 mmol/L (ref 22–32)
Calcium: 9.9 mg/dL (ref 8.9–10.3)
Chloride: 102 mmol/L (ref 98–111)
Creatinine, Ser: 2.29 mg/dL — ABNORMAL HIGH (ref 0.44–1.00)
GFR, Estimated: 20 mL/min — ABNORMAL LOW (ref >60)
Glucose, Bld: 127 mg/dL — ABNORMAL HIGH (ref 70–99)
Potassium: 3.9 mmol/L (ref 3.5–5.1)
Sodium: 139 mmol/L (ref 135–145)

## 2023-03-31 LAB — CBC WITH DIFFERENTIAL/PLATELET
Abs Immature Granulocytes: 0.02 10*3/uL (ref 0.00–0.07)
Basophils Absolute: 0 10*3/uL (ref 0.0–0.1)
Basophils Relative: 0 %
Eosinophils Absolute: 0 10*3/uL (ref 0.0–0.5)
Eosinophils Relative: 1 %
HCT: 29.8 % — ABNORMAL LOW (ref 36.0–46.0)
Hemoglobin: 9.2 g/dL — ABNORMAL LOW (ref 12.0–15.0)
Immature Granulocytes: 0 %
Lymphocytes Relative: 8 %
Lymphs Abs: 0.4 10*3/uL — ABNORMAL LOW (ref 0.7–4.0)
MCH: 28.5 pg (ref 26.0–34.0)
MCHC: 30.9 g/dL (ref 30.0–36.0)
MCV: 92.3 fL (ref 80.0–100.0)
Monocytes Absolute: 0.5 10*3/uL (ref 0.1–1.0)
Monocytes Relative: 10 %
Neutro Abs: 4.2 10*3/uL (ref 1.7–7.7)
Neutrophils Relative %: 81 %
Platelets: 186 10*3/uL (ref 150–400)
RBC: 3.23 MIL/uL — ABNORMAL LOW (ref 3.87–5.11)
RDW: 14 % (ref 11.5–15.5)
WBC: 5.2 10*3/uL (ref 4.0–10.5)
nRBC: 0 % (ref 0.0–0.2)

## 2023-03-31 LAB — CK: Total CK: 1001 U/L — ABNORMAL HIGH (ref 38–234)

## 2023-03-31 LAB — GLUCOSE, CAPILLARY
Glucose-Capillary: 115 mg/dL — ABNORMAL HIGH (ref 70–99)
Glucose-Capillary: 110 mg/dL — ABNORMAL HIGH (ref 70–99)

## 2023-03-31 MED ORDER — ENOXAPARIN SODIUM 30 MG/0.3ML IJ SOSY
30.0000 mg | PREFILLED_SYRINGE | INTRAMUSCULAR | Status: DC
  Administered 2023-03-31 – 2023-04-06 (×7): 30 mg via SUBCUTANEOUS
  Filled 2023-03-31 (×7): qty 0.3

## 2023-03-31 MED ORDER — ONDANSETRON HCL 4 MG/2ML IJ SOLN
4.0000 mg | Freq: Once | INTRAMUSCULAR | Status: AC
  Administered 2023-03-31: 4 mg via INTRAVENOUS
  Filled 2023-03-31: qty 2

## 2023-03-31 MED ORDER — MORPHINE SULFATE (PF) 4 MG/ML IV SOLN
4.0000 mg | Freq: Once | INTRAVENOUS | Status: AC
  Administered 2023-03-31: 4 mg via INTRAVENOUS
  Filled 2023-03-31: qty 1

## 2023-03-31 MED ORDER — ACETAMINOPHEN 325 MG PO TABS
650.0000 mg | ORAL_TABLET | Freq: Four times a day (QID) | ORAL | Status: DC | PRN
  Administered 2023-04-01: 650 mg via ORAL
  Filled 2023-03-31: qty 2

## 2023-03-31 MED ORDER — HYDROCODONE-ACETAMINOPHEN 10-325 MG PO TABS
1.0000 | ORAL_TABLET | ORAL | Status: DC | PRN
  Administered 2023-03-31 – 2023-04-02 (×5): 1 via ORAL
  Filled 2023-03-31 (×5): qty 1

## 2023-03-31 MED ORDER — ROPINIROLE HCL 1 MG PO TABS
1.0000 mg | ORAL_TABLET | Freq: Three times a day (TID) | ORAL | Status: DC
  Administered 2023-03-31 – 2023-04-07 (×21): 1 mg via ORAL
  Filled 2023-03-31 (×21): qty 1

## 2023-03-31 MED ORDER — GABAPENTIN 300 MG PO CAPS
300.0000 mg | ORAL_CAPSULE | Freq: Every day | ORAL | Status: DC
  Administered 2023-03-31 – 2023-04-06 (×7): 300 mg via ORAL
  Filled 2023-03-31 (×7): qty 1

## 2023-03-31 MED ORDER — INSULIN ASPART 100 UNIT/ML IJ SOLN
0.0000 [IU] | Freq: Three times a day (TID) | INTRAMUSCULAR | Status: DC
  Administered 2023-04-01 – 2023-04-02 (×3): 1 [IU] via SUBCUTANEOUS
  Administered 2023-04-04: 2 [IU] via SUBCUTANEOUS
  Administered 2023-04-05: 1 [IU] via SUBCUTANEOUS
  Administered 2023-04-05 – 2023-04-06 (×3): 2 [IU] via SUBCUTANEOUS

## 2023-03-31 MED ORDER — LACTATED RINGERS IV BOLUS
1000.0000 mL | Freq: Once | INTRAVENOUS | Status: AC
  Administered 2023-03-31: 1000 mL via INTRAVENOUS

## 2023-03-31 MED ORDER — ONDANSETRON HCL 4 MG PO TABS
8.0000 mg | ORAL_TABLET | Freq: Three times a day (TID) | ORAL | Status: DC | PRN
  Administered 2023-03-31 – 2023-04-07 (×2): 8 mg via ORAL
  Filled 2023-03-31 (×2): qty 2

## 2023-03-31 MED ORDER — SODIUM CHLORIDE 0.9 % IV SOLN: INTRAVENOUS | Status: AC

## 2023-03-31 MED ORDER — FENTANYL 12 MCG/HR TD PT72
1.0000 | MEDICATED_PATCH | TRANSDERMAL | Status: DC
  Administered 2023-04-02 – 2023-04-05 (×2): 1 via TRANSDERMAL
  Filled 2023-03-31 (×3): qty 1

## 2023-03-31 MED ORDER — METOPROLOL TARTRATE 25 MG PO TABS
12.5000 mg | ORAL_TABLET | Freq: Two times a day (BID) | ORAL | Status: DC
  Administered 2023-04-01 – 2023-04-04 (×5): 12.5 mg via ORAL
  Filled 2023-03-31 (×7): qty 1

## 2023-03-31 MED ORDER — ISOSORBIDE MONONITRATE ER 30 MG PO TB24
30.0000 mg | ORAL_TABLET | Freq: Every day | ORAL | Status: DC
  Administered 2023-03-31 – 2023-04-04 (×4): 30 mg via ORAL
  Filled 2023-03-31 (×5): qty 1

## 2023-03-31 MED ORDER — POLYVINYL ALCOHOL 1.4 % OP SOLN
1.0000 [drp] | Freq: Three times a day (TID) | OPHTHALMIC | Status: DC | PRN
  Filled 2023-03-31: qty 15

## 2023-03-31 MED ORDER — ACETAMINOPHEN 650 MG RE SUPP: 650.0000 mg | Freq: Four times a day (QID) | RECTAL | Status: DC | PRN

## 2023-03-31 MED ORDER — HYDROMORPHONE HCL 1 MG/ML IJ SOLN
0.5000 mg | INTRAMUSCULAR | Status: DC | PRN
  Administered 2023-04-01: 0.5 mg via INTRAVENOUS
  Filled 2023-03-31: qty 1

## 2023-03-31 MED ORDER — TRAZODONE HCL 50 MG PO TABS: 50.0000 mg | ORAL_TABLET | Freq: Every day | ORAL | Status: DC

## 2023-03-31 MED ORDER — POLYETHYLENE GLYCOL 3350 17 G PO PACK: 17.0000 g | PACK | Freq: Every day | ORAL | Status: DC | PRN

## 2023-03-31 MED ORDER — ALPRAZOLAM 0.25 MG PO TABS
0.5000 mg | ORAL_TABLET | Freq: Two times a day (BID) | ORAL | Status: DC | PRN
  Administered 2023-04-01 – 2023-04-03 (×4): 0.5 mg via ORAL
  Filled 2023-03-31 (×5): qty 2

## 2023-03-31 MED ORDER — ASPIRIN 81 MG PO TBEC
81.0000 mg | DELAYED_RELEASE_TABLET | Freq: Every day | ORAL | Status: DC
  Administered 2023-04-01 – 2023-04-07 (×7): 81 mg via ORAL
  Filled 2023-03-31 (×7): qty 1

## 2023-03-31 MED ORDER — TRAZODONE HCL 50 MG PO TABS
150.0000 mg | ORAL_TABLET | Freq: Every day | ORAL | Status: DC
  Administered 2023-03-31 – 2023-04-06 (×7): 150 mg via ORAL
  Filled 2023-03-31 (×8): qty 3

## 2023-03-31 MED ORDER — ROSUVASTATIN CALCIUM 20 MG PO TABS
40.0000 mg | ORAL_TABLET | Freq: Every day | ORAL | Status: DC
  Administered 2023-03-31 – 2023-04-06 (×7): 40 mg via ORAL
  Filled 2023-03-31 (×3): qty 2
  Filled 2023-03-31: qty 8
  Filled 2023-03-31 (×3): qty 2

## 2023-03-31 MED ORDER — LOPERAMIDE HCL 2 MG PO CAPS: 2.0000 mg | ORAL_CAPSULE | ORAL | Status: DC | PRN

## 2023-03-31 MED ORDER — SODIUM CHLORIDE 0.9% FLUSH
3.0000 mL | Freq: Two times a day (BID) | INTRAVENOUS | Status: DC
  Administered 2023-03-31 – 2023-04-07 (×13): 3 mL via INTRAVENOUS

## 2023-03-31 NOTE — Plan of Care (Signed)
  Problem: Education: Goal: Knowledge of General Education information will improve Description: Including pain rating scale, medication(s)/side effects and non-pharmacologic comfort measures Outcome: Progressing   Problem: Clinical Measurements: Goal: Ability to maintain clinical measurements within normal limits will improve Outcome: Progressing Goal: Respiratory complications will improve Outcome: Progressing   Problem: Coping: Goal: Level of anxiety will decrease Outcome: Progressing   

## 2023-03-31 NOTE — ED Provider Notes (Signed)
[place] EMERGENCY DEPARTMENT AT [facility] Provider Note   CSN: [id number] Arrival date & time: 03/31/23  1118     History  Chief Complaint  Patient presents with   Marletta Lor    [name] is a 88 y.o. female.  This is a 88 year old female history of non-small cell lung cancer currently receiving chemotherapy who presents emergency department after she had 2 falls today.  Patient ambulates with a walker at home, but says that her walker does not fit within the door frame, so she used her cane.  Says her legs then got weak and she fell down on the ground.  She denies any head strike.  Not on blood thinners.  Patient says that when she fell, she landed on her bottom, she was unable to get up, later had another similar fall.  She then called her daughter who called EMS to bring her to the hospital.  I reviewed the patient's most recent oncology office note.   Fall       Home Medications Prior to Admission medications   Medication Sig Start Date End Date Taking? Authorizing Provider  ALPRAZolam Prudy Feeler) 0.5 MG tablet Take 0.5 mg by mouth 2 (two) times daily as needed for anxiety. 09/11/13   [provider]  aspirin EC 81 MG tablet Take 1 tablet (81 mg total) by mouth daily. Swallow whole. 11/01/21   Lennette Bihari, MD  carboxymethylcellulose (REFRESH PLUS) 0.5 % SOLN Place 1 drop into both eyes 3 (three) times daily as needed (dry eyes).    [provider]  fentaNYL (DURAGESIC) 12 MCG/HR Place 1 patch onto the skin every 3 (three) days. 03/02/23   Rana Snare, NP  gabapentin (NEURONTIN) 300 MG capsule Take 300 mg by mouth at bedtime.     [provider]  hydrochlorothiazide (MICROZIDE) 12.5 MG capsule Take 12.5 mg by mouth daily as needed (fluid).    [provider]  HYDROcodone-acetaminophen (NORCO) 10-325 MG tablet Take 1 tablet by mouth every 4 (four) hours as needed. 03/14/23   Rana Snare, NP  isosorbide mononitrate  (IMDUR) 30 MG 24 hr tablet TAKE 1 TABLET BY MOUTH DAILY 02/19/23   Lennette Bihari, MD  lisinopril (ZESTRIL) 10 MG tablet TAKE 1 TABLET BY MOUTH DAILY 03/12/23   Lennette Bihari, MD  loperamide (IMODIUM) 2 MG capsule Take 2-4 mg by mouth as needed for diarrhea or loose stools.    [provider]  Magnesium 400 MG CAPS Take 250 mg by mouth as needed. Takes with pain med    [provider]  metFORMIN (GLUCOPHAGE) 500 MG tablet Take 500 mg by mouth at bedtime. 12/18/20   [provider]  methylPREDNISolone (MEDROL) 4 MG tablet Take 0.5 tablets (2 mg total) by mouth daily. 01/11/23   Rana Snare, NP  metoprolol tartrate (LOPRESSOR) 25 MG tablet Take 0.5 tablets (12.5 mg total) by mouth 2 (two) times daily. 06/06/22   Lennette Bihari, MD  Multiple Vitamins-Minerals (CENTRUM SILVER PO) Take 1 tablet by mouth daily.    [provider]  nitroGLYCERIN (NITROSTAT) 0.4 MG SL tablet Place 1 tablet (0.4 mg total) under the tongue every 5 (five) minutes as needed for chest pain. Patient not taking: Reported on 02/21/2023 06/27/21   Lennette Bihari, MD  ondansetron (ZOFRAN) 8 MG tablet Take 8 mg by mouth every 8 (eight) hours as needed for nausea or vomiting.    [provider]  polyethylene glycol (  MIRALAX / GLYCOLAX) packet Take 17 g by mouth daily. Patient not taking: Reported on 02/21/2023    [provider]  rOPINIRole (REQUIP) 1 MG tablet Take 1 mg by mouth 3 (three) times daily.    [provider]  rosuvastatin (CRESTOR) 40 MG tablet Take 40 mg by mouth at bedtime.     [provider]  traZODone (DESYREL) 100 MG tablet Take 1 tablet (100 mg total) by mouth at bedtime. Take with 50 mg= 150 mg at bedtime 03/14/23   Rana Snare, NP  traZODone (DESYREL) 50 MG tablet Take 1 tablet (50 mg total) by mouth at bedtime. Take with trazodone 100mg  at bedtime--total bedtime dose 150mg  03/14/23   Rana Snare, NP  Vitamin D, Ergocalciferol, (DRISDOL)  50000 UNITS CAPS capsule Take 50,000 Units by mouth every Sunday.    [provider]  zinc gluconate 50 MG tablet Take 50 mg by mouth at bedtime.    [provider]      Allergies    Codeine    Review of Systems   Review of Systems  Physical Exam Updated Vital Signs BP (!) 125/53   Pulse 68   Temp 97.9 F (36.6 C) (Oral)   Resp 15   SpO2 99%  Physical Exam HENT:     Head: Normocephalic and atraumatic.     Nose: Nose normal.     Mouth/Throat:     Mouth: Mucous membranes are moist.  Eyes:     Extraocular Movements: Extraocular movements intact.     Pupils: Pupils are equal, round, and reactive to light.  Cardiovascular:     Rate and Rhythm: Normal rate.     Heart sounds: No murmur heard. Pulmonary:     Effort: Pulmonary effort is normal. No respiratory distress.  Abdominal:     General: There is no distension.  Musculoskeletal:        General: Tenderness present. Normal range of motion.     Cervical back: Normal range of motion. No rigidity.     Comments: Tenderness over the greater trochanter of the left hip.  No tenderness of the thoracic or lumbar spine.  Skin:    General: Skin is warm and dry.  Neurological:     General: No focal deficit present.     Mental Status: She is alert and oriented to person, place, and time.     Sensory: No sensory deficit.     Motor: No weakness.  Psychiatric:        Mood and Affect: Mood normal.     ED Results / Procedures / Treatments   Labs (all labs ordered are listed, but only abnormal results are displayed) Labs Reviewed  BASIC METABOLIC PANEL - Abnormal; Notable for the following components:      Result Value   Glucose, Bld 127 (*)    BUN 43 (*)    Creatinine, Ser 2.29 (*)    GFR, Estimated 20 (*)    All other components within normal limits  CBC WITH DIFFERENTIAL/PLATELET - Abnormal; Notable for the following components:   RBC 3.23 (*)    Hemoglobin 9.2 (*)    HCT 29.8 (*)    Lymphs Abs 0.4 (*)     All other components within normal limits  CK - Abnormal; Notable for the following components:   Total CK 1,001 (*)    All other components within normal limits  URINALYSIS, ROUTINE W REFLEX MICROSCOPIC    EKG None  Radiology CT PELVIS  WO CONTRAST  Result Date: 03/31/2023 CLINICAL DATA:  Pelvic fracture. EXAM: CT PELVIS WITHOUT CONTRAST TECHNIQUE: Multidetector CT imaging of the pelvis was performed following the standard protocol without intravenous contrast. RADIATION DOSE REDUCTION: This exam was performed according to the departmental dose-optimization program which includes automated exposure control, adjustment of the mA and/or kV according to patient size and/or use of iterative reconstruction technique. COMPARISON:  CT pelvis and left hip 11/02/2022, PET-CT 12/29/2022 and left hip radiographs 01/20/2023. FINDINGS: Urinary Tract: The visualized distal ureters and bladder appear unremarkable. Bowel: No bowel wall thickening, distention or surrounding inflammation identified within the pelvis. Mild distal colonic diverticulosis. Vascular/Lymphatic: No enlarged pelvic lymph nodes identified. Aortoiliac atherosclerosis without evidence of aneurysm. Reproductive: Hysterectomy.  No adnexal mass. Other: No evidence of pelvic ascites or pneumoperitoneum. Musculoskeletal: Unchanged position of the hardware status post left iliac and superior pubic ramus screw fixation. There is no hardware loosening. The inferior left iliac screw traverses the left sacroiliac joint, as before. The left superior pubic ramus screw traverses the superior acetabulum, as before. There is a new small amount of soft tissue thickening around the head of this screw. Previously demonstrated comminuted left acetabular fracture demonstrates mildly progressive displacement, with up to 7 mm of displacement along the pelvic sidewall on image 69/4. No interval fracture healing identified. No new fractures are identified. There is no  evidence of proximal femur fracture. Probable small left hip joint effusion with synovial thickening. No new lytic or blastic lesions are identified. There are stable degenerative changes within the sacroiliac joints and lower lumbar spine status post lower lumbar interspinous fusion. No significant pelvic or proximal thigh soft tissue hematoma or focal muscular atrophy. IMPRESSION: 1. Mildly progressive displacement of the comminuted left acetabular fracture status post ORIF. No interval fracture healing identified. 2. No new fractures are identified. 3. No evidence of hardware loosening. 4.  Aortic Atherosclerosis (ICD10-I70.0). Electronically Signed   By: Carey Bullocks M.D.   On: 03/31/2023 13:00   CT Head Wo Contrast  Result Date: 03/31/2023 CLINICAL DATA:  Ataxia, head trauma; Neck trauma, dangerous injury mechanism (Age 54-64y). EXAM: CT HEAD WITHOUT CONTRAST CT CERVICAL SPINE WITHOUT CONTRAST TECHNIQUE: Multidetector CT imaging of the head and cervical spine was performed following the standard protocol without intravenous contrast. Multiplanar CT image reconstructions of the cervical spine were also generated. RADIATION DOSE REDUCTION: This exam was performed according to the departmental dose-optimization program which includes automated exposure control, adjustment of the mA and/or kV according to patient size and/or use of iterative reconstruction technique. COMPARISON:  PET CT May 31 24. FINDINGS: CT HEAD FINDINGS Brain: No evidence of acute infarction, hemorrhage, hydrocephalus, extra-axial collection or mass lesion/mass effect. Vascular: No hyperdense vessel.  Calcific atherosclerosis. Skull: No acute fracture. Sinuses/Orbits: Clear sinuses.  No acute orbital finding. Other: No mastoid effusions CT CERVICAL SPINE FINDINGS Alignment: No substantial sagittal subluxation. Skull base and vertebrae: Solid C5-C6 ACDF. No acute fracture. No definite suspicious bone lesion. Soft tissues and spinal canal:  No prevertebral fluid or swelling. No visible canal hematoma. Disc levels: Multilevel degenerative change, better characterized on prior MRI. Upper chest: Visualized lung apices are clear. IMPRESSION: No evidence of acute abnormality intracranially or in the cervical spine. Electronically Signed   By: Feliberto Harts M.D.   On: 03/31/2023 12:49   CT Cervical Spine Wo Contrast  Result Date: 03/31/2023 CLINICAL DATA:  Ataxia, head trauma; Neck trauma, dangerous injury mechanism (Age 2-64y). EXAM: CT HEAD WITHOUT CONTRAST CT CERVICAL SPINE WITHOUT CONTRAST  TECHNIQUE: Multidetector CT imaging of the head and cervical spine was performed following the standard protocol without intravenous contrast. Multiplanar CT image reconstructions of the cervical spine were also generated. RADIATION DOSE REDUCTION: This exam was performed according to the departmental dose-optimization program which includes automated exposure control, adjustment of the mA and/or kV according to patient size and/or use of iterative reconstruction technique. COMPARISON:  PET CT May 31 24. FINDINGS: CT HEAD FINDINGS Brain: No evidence of acute infarction, hemorrhage, hydrocephalus, extra-axial collection or mass lesion/mass effect. Vascular: No hyperdense vessel.  Calcific atherosclerosis. Skull: No acute fracture. Sinuses/Orbits: Clear sinuses.  No acute orbital finding. Other: No mastoid effusions CT CERVICAL SPINE FINDINGS Alignment: No substantial sagittal subluxation. Skull base and vertebrae: Solid C5-C6 ACDF. No acute fracture. No definite suspicious bone lesion. Soft tissues and spinal canal: No prevertebral fluid or swelling. No visible canal hematoma. Disc levels: Multilevel degenerative change, better characterized on prior MRI. Upper chest: Visualized lung apices are clear. IMPRESSION: No evidence of acute abnormality intracranially or in the cervical spine. Electronically Signed   By: Feliberto Harts M.D.   On: 03/31/2023 12:49     Procedures Procedures    Medications Ordered in ED Medications  lactated ringers bolus 1,000 mL (has no administration in time range)  morphine (PF) 4 MG/ML injection 4 mg (has no administration in time range)  morphine (PF) 4 MG/ML injection 4 mg (4 mg Intravenous Given 03/31/23 1245)  ondansetron (ZOFRAN) injection 4 mg (4 mg Intravenous Given 03/31/23 1245)    ED Course/ Medical Decision Making/ A&P                                 Medical Decision Making 88 year old female here today after a fall.  Differential diagnoses include chronic left hip fracture, chronic deconditioning, dehydration, metabolic abnormalities, anemia.  Plan- patient has been having a tough few months since losing her husband 3 months ago.  She continues to receive chemotherapy treatment.  She lives alone at home, has a daughter who lives nearby.  Will obtain imaging of the patient's pelvis to assess for new or worsening fracture.  Blood work ordered.  CT imaging of the patient's head and neck ordered.  If patient's workup is unremarkable, would discuss safe dispo issue with the patient the patient's daughter.  Reassessment-no new fracture on imaging.  My dependent review the patient's head CT shows no intracranial hemorrhage.  Patient does have a near doubling in her creatinine, coupled with a CK greater than 1000.  Will start her with some IV fluids.  Will admit patient to hospitalist.  Amount and/or Complexity of Data Reviewed Labs: ordered. Radiology: ordered.  Risk Prescription drug management.           Final Clinical Impression(s) / ED Diagnoses Final diagnoses:  Acute kidney injury [facility])    Rx / DC Orders ED Discharge Orders     None         Arletha Pili, DO 03/31/23 1345

## 2023-03-31 NOTE — H&P (Addendum)
History and Physical   [name]:[id number] DOB: [birth year] DOA: 03/31/2023  PCP: Garlan Fillers, MD   Patient coming from: Home  Chief Complaint: Fall  HPI: [name] is a 88 y.o. female with medical history significant of hypertension, hyperlipidemia, diabetes, CAD status post stent, lung cancer with bone mets status post chemo and continued on immunotherapy, anxiety, insomnia, RLS, cervical spine disease status post surgical intervention, chronic left acetabular fracture presenting with falls.  Patient had 2 falls today.  She typically ambulates with a walker at home however this walker does not fit through the door frame and so she uses the cane to walk through door frame.  While using the cane her legs got weak and she fell.  She landed on her bottom and had a hard time getting up.  She had a second fall following this and called her daughter who called EMS to transport her to the ED for further evaluation.  Patient does currently live alone with daughter living nearby.  Her husband passed away few months ago.  Patient continues on immunotherapy for lung cancer which is stable.  She does have bone metastases which affects her mobility along with a subsequent chronic left acetabular pathologic fracture.  Denies fevers, chills, chest pain, shortness of breath, abdominal pain, constipation, diarrhea, nausea, vomiting.  Patient family expressed concern over continued/worsening pain at her chronic fracture site.  They are requesting second opinion from orthopedic surgery here (original procedure was done at [facility]).  They understand that there may be nothing additional to be done but want to make sure.  ED Course: Vital signs in the ED stable.  Lab workup included BMP with glucose 127, BUN 43, creatinine elevated 2.24 from a baseline of 1.1.  CBC with hemoglobin stable at 9.2.  CK elevated at 1000.  Urinalysis pending.  CT head showed no acute abnormality, CT  C-spine showed no acute abnormality.  CT of the pelvis showed mild progression of the displaced chronic left acetabular fracture status post ORIF.  Patient received morphine, a liter of fluids, Zofran in the ED.  Review of Systems: As per HPI otherwise all other systems reviewed and are negative.  Past Medical History:  Diagnosis Date   Aneurysm (HCC)    Right eye a non DES stent was placed so that she would not required long term dual antiplatelet therapy.   Anginal pain (HCC)    Anxiety    not currently taking any meds   Arthritis    CAD (coronary artery disease)    Diabetes mellitus without complication (HCC)    H/O hiatal hernia    Heart murmur    History of stress test 03/31/2012   The post stress myocardial perfusion images show a normal pattern of perfusion in all region. The post left ventricles is normal in size. There is no scintigraphic of inductible myocardial ischemia. The post EF is 65   Hx of echocardiogram 11/29/2010   Ef 67% Normal size chambes, Aortic valve sclerosis without stenosis, No other significant valvular abnormalities, No percardial effusion.   Hyperlipemia    Hypertension    Insulin resistance    Lung cancer (HCC)    Multiple thyroid nodules    Neuromuscular disorder (HCC)    nerve pain after shingles   Osteoporosis    Restless legs    Shingles     Past Surgical History:  Procedure Laterality Date   ABDOMINAL HYSTERECTOMY  1970   ANGIOPLASTY  Stenting of a 90% eccentric right coronary artery stenosis and had a 3.0x15 mm Integrity bare-metal stent inserted   ANTERIOR CERVICAL DECOMP/DISCECTOMY FUSION N/A 07/30/2013   Procedure: ANTERIOR CERVICAL DECOMPRESSION/DISCECTOMY FUSION CERVICAL FIVE -SIX;  Surgeon: Tia Alert, MD;  Location: [facility] OR[facility];  Service: Neurosurgery;  Laterality: N/A;   APPENDECTOMY     BREAST SURGERY Left    cysts removed from left breast (in her 20'2)   CARDIAC CATHETERIZATION     Showed a widely patent stent, she did  have 60% ostial diagonal-1 stenosis, She also had mild luminal irregularities of her LAD.   COLONOSCOPY     EYE SURGERY Bilateral 2009   cateract surgery- bilateral   IR IMAGING GUIDED PORT INSERTION  06/04/2019   LAMINECTOMY WITH POSTERIOR LATERAL ARTHRODESIS LEVEL 2 Right 07/18/2021   Procedure: Laminectomy and Foraminotomy - Lumbar four-Lumbar five - Lumbar five-Sacral one- right, posterolateral fusion Lumbar four-five with interspinous plate;  Surgeon: Tia Alert, MD;  Location: [facility] OR;  Service: Neurosurgery;  Laterality: Right;   LEFT HEART CATH AND CORONARY ANGIOGRAPHY N/A 11/15/2021   Procedure: LEFT HEART CATH AND CORONARY ANGIOGRAPHY;  Surgeon: Lennette Bihari, MD;  Location: [facility] INVASIVE CV LAB;  Service: Cardiovascular;  Laterality: N/A;   REFRACTIVE SURGERY Left 2011   TONSILLECTOMY      Social History  reports that she has quit smoking. Her smoking use included cigarettes. She has a 66 pack-year smoking history. She has never used smokeless tobacco. She reports current alcohol use. She reports that she does not use drugs.  Allergies  Allergen Reactions   Codeine Nausea And Vomiting and Other (See Comments)    Severe constipation.    Family History  Problem Relation Age of Onset   Alzheimer's disease Mother 28   Colon polyps Mother    Diabetes Mother    Heart attack Father 58   Cancer Maternal Grandmother 71   Breast cancer Maternal Grandmother    Diabetes Sister    Breast cancer Maternal Uncle    Breast cancer Maternal Aunt        x 2   Colon polyps Sister    Colon polyps Maternal Aunt    Colon cancer Neg Hx    Esophageal cancer Neg Hx    Rectal cancer Neg Hx    Stomach cancer Neg Hx   Reviewed on admission  Prior to Admission medications   Medication Sig Start Date End Date Taking? Authorizing Provider  ALPRAZolam Prudy Feeler) 0.5 MG tablet Take 0.5 mg by mouth 2 (two) times daily as needed for anxiety. 09/11/13   [provider]  aspirin EC 81 MG tablet  Take 1 tablet (81 mg total) by mouth daily. Swallow whole. 11/01/21   Lennette Bihari, MD  carboxymethylcellulose (REFRESH PLUS) 0.5 % SOLN Place 1 drop into both eyes 3 (three) times daily as needed (dry eyes).    [provider]  fentaNYL (DURAGESIC) 12 MCG/HR Place 1 patch onto the skin every 3 (three) days. 03/02/23   Rana Snare, NP  gabapentin (NEURONTIN) 300 MG capsule Take 300 mg by mouth at bedtime.     [provider]  hydrochlorothiazide (MICROZIDE) 12.5 MG capsule Take 12.5 mg by mouth daily as needed (fluid).    [provider]  HYDROcodone-acetaminophen (NORCO) 10-325 MG tablet Take 1 tablet by mouth every 4 (four) hours as needed. 03/14/23   Rana Snare, NP  isosorbide mononitrate (IMDUR) 30 MG 24 hr tablet TAKE 1 TABLET  BY MOUTH DAILY 02/19/23   Lennette Bihari, MD  lisinopril (ZESTRIL) 10 MG tablet TAKE 1 TABLET BY MOUTH DAILY 03/12/23   Lennette Bihari, MD  loperamide (IMODIUM) 2 MG capsule Take 2-4 mg by mouth as needed for diarrhea or loose stools.    [provider]  Magnesium 400 MG CAPS Take 250 mg by mouth as needed. Takes with pain med    [provider]  metFORMIN (GLUCOPHAGE) 500 MG tablet Take 500 mg by mouth at bedtime. 12/18/20   [provider]  methylPREDNISolone (MEDROL) 4 MG tablet Take 0.5 tablets (2 mg total) by mouth daily. 01/11/23   Rana Snare, NP  metoprolol tartrate (LOPRESSOR) 25 MG tablet Take 0.5 tablets (12.5 mg total) by mouth 2 (two) times daily. 06/06/22   Lennette Bihari, MD  Multiple Vitamins-Minerals (CENTRUM SILVER PO) Take 1 tablet by mouth daily.    [provider]  nitroGLYCERIN (NITROSTAT) 0.4 MG SL tablet Place 1 tablet (0.4 mg total) under the tongue every 5 (five) minutes as needed for chest pain. Patient not taking: Reported on 02/21/2023 06/27/21   Lennette Bihari, MD  ondansetron (ZOFRAN) 8 MG tablet Take 8 mg by mouth every 8 (eight) hours as needed for nausea or vomiting.     [provider]  polyethylene glycol (MIRALAX / GLYCOLAX) packet Take 17 g by mouth daily. Patient not taking: Reported on 02/21/2023    [provider]  rOPINIRole (REQUIP) 1 MG tablet Take 1 mg by mouth 3 (three) times daily.    [provider]  rosuvastatin (CRESTOR) 40 MG tablet Take 40 mg by mouth at bedtime.     [provider]  traZODone (DESYREL) 100 MG tablet Take 1 tablet (100 mg total) by mouth at bedtime. Take with 50 mg= 150 mg at bedtime 03/14/23   Rana Snare, NP  traZODone (DESYREL) 50 MG tablet Take 1 tablet (50 mg total) by mouth at bedtime. Take with trazodone 100mg  at bedtime--total bedtime dose 150mg  03/14/23   Rana Snare, NP  Vitamin D, Ergocalciferol, (DRISDOL) 50000 UNITS CAPS capsule Take 50,000 Units by mouth every Sunday.    [provider]  zinc gluconate 50 MG tablet Take 50 mg by mouth at bedtime.    [provider]    Physical Exam: Vitals:   03/31/23 1200 03/31/23 1303 03/31/23 1345 03/31/23 1405  BP: (!) 127/107 (!) 125/53 129/62   Pulse: 64 68 70 73  Resp: 12 15 16 17   Temp:      TempSrc:      SpO2: 94% 99% 98% 98%    Physical Exam Constitutional:      General: She is not in acute distress.    Comments: Uncomfortable appearing  HENT:     Head: Normocephalic and atraumatic.     Mouth/Throat:     Mouth: Mucous membranes are moist.     Pharynx: Oropharynx is clear.  Eyes:     Extraocular Movements: Extraocular movements intact.     Pupils: Pupils are equal, round, and reactive to light.  Cardiovascular:     Rate and Rhythm: Normal rate and regular rhythm.     Pulses: Normal pulses.     Heart sounds: Normal heart sounds.  Pulmonary:     Effort: Pulmonary effort is normal. No respiratory distress.     Breath sounds: Normal breath sounds.  Abdominal:     General: Bowel sounds are normal. There is no distension.  Palpations: Abdomen is soft.     Tenderness: There is no abdominal  tenderness.  Musculoskeletal:        General: No swelling or deformity.  Skin:    General: Skin is warm and dry.  Neurological:     General: No focal deficit present.     Mental Status: Mental status is at baseline.     Labs on Admission: I have personally reviewed following labs and imaging studies  CBC: Recent Labs  Lab 03/31/23 1145  WBC 5.2  NEUTROABS 4.2  HGB 9.2*  HCT 29.8*  MCV 92.3  PLT 186    Basic Metabolic Panel: Recent Labs  Lab 03/31/23 1145  NA 139  K 3.9  CL 102  CO2 23  GLUCOSE 127*  BUN 43*  CREATININE 2.29*  CALCIUM 9.9    GFR: CrCl cannot be calculated (Unknown ideal weight.).  Liver Function Tests: No results for input(s): "AST", "ALT", "ALKPHOS", "BILITOT", "PROT", "ALBUMIN" in the last 168 hours.  Urine analysis:    Component Value Date/Time   COLORURINE COLORLESS (A) 03/17/2022 1200   APPEARANCEUR CLEAR 03/17/2022 1200   LABSPEC 1.009 03/17/2022 1200   PHURINE 5.5 03/17/2022 1200   GLUCOSEU NEGATIVE 03/17/2022 1200   HGBUR NEGATIVE 03/17/2022 1200   BILIRUBINUR NEGATIVE 03/17/2022 1200   KETONESUR NEGATIVE 03/17/2022 1200   PROTEINUR NEGATIVE 03/17/2022 1200   NITRITE NEGATIVE 03/17/2022 1200   LEUKOCYTESUR NEGATIVE 03/17/2022 1200    Radiological Exams on Admission: CT PELVIS WO CONTRAST  Result Date: 03/31/2023 CLINICAL [name]:  Pelvic fracture. EXAM: CT PELVIS WITHOUT CONTRAST TECHNIQUE: Multidetector CT imaging of the pelvis was performed following the standard protocol without intravenous contrast. RADIATION DOSE REDUCTION: This exam was performed according to the departmental dose-optimization program which includes automated exposure control, adjustment of the mA and/or kV according to patient size and/or use of iterative reconstruction technique. COMPARISON:  CT pelvis and left hip 11/02/2022, PET-CT 12/29/2022 and left hip radiographs 01/20/2023. FINDINGS: Urinary Tract: The visualized distal ureters and bladder appear  unremarkable. Bowel: No bowel wall thickening, distention or surrounding inflammation identified within the pelvis. Mild distal colonic diverticulosis. Vascular/Lymphatic: No enlarged pelvic lymph nodes identified. Aortoiliac atherosclerosis without evidence of aneurysm. Reproductive: Hysterectomy.  No adnexal mass. Other: No evidence of pelvic ascites or pneumoperitoneum. Musculoskeletal: Unchanged position of the hardware status post left iliac and superior pubic ramus screw fixation. There is no hardware loosening. The inferior left iliac screw traverses the left sacroiliac joint, as before. The left superior pubic ramus screw traverses the superior acetabulum, as before. There is a new small amount of soft tissue thickening around the head of this screw. Previously demonstrated comminuted left acetabular fracture demonstrates mildly progressive displacement, with up to 7 mm of displacement along the pelvic sidewall on image 69/4. No interval fracture healing identified. No new fractures are identified. There is no evidence of proximal femur fracture. Probable small left hip joint effusion with synovial thickening. No new lytic or blastic lesions are identified. There are stable degenerative changes within the sacroiliac joints and lower lumbar spine status post lower lumbar interspinous fusion. No significant pelvic or proximal thigh soft tissue hematoma or focal muscular atrophy. IMPRESSION: 1. Mildly progressive displacement of the comminuted left acetabular fracture status post ORIF. No interval fracture healing identified. 2. No new fractures are identified. 3. No evidence of hardware loosening. 4.  Aortic Atherosclerosis (ICD10-I70.0). Electronically Signed   By: Carey Bullocks M.D.   On: 03/31/2023 13:00   CT Head Wo Contrast  Result Date: 03/31/2023 CLINICAL [name]:  Ataxia, head trauma; Neck trauma, dangerous injury mechanism (Age 49-64y). EXAM: CT HEAD WITHOUT CONTRAST CT CERVICAL SPINE WITHOUT  CONTRAST TECHNIQUE: Multidetector CT imaging of the head and cervical spine was performed following the standard protocol without intravenous contrast. Multiplanar CT image reconstructions of the cervical spine were also generated. RADIATION DOSE REDUCTION: This exam was performed according to the departmental dose-optimization program which includes automated exposure control, adjustment of the mA and/or kV according to patient size and/or use of iterative reconstruction technique. COMPARISON:  PET CT May 31 24. FINDINGS: CT HEAD FINDINGS Brain: No evidence of acute infarction, hemorrhage, hydrocephalus, extra-axial collection or mass lesion/mass effect. Vascular: No hyperdense vessel.  Calcific atherosclerosis. Skull: No acute fracture. Sinuses/Orbits: Clear sinuses.  No acute orbital finding. Other: No mastoid effusions CT CERVICAL SPINE FINDINGS Alignment: No substantial sagittal subluxation. Skull base and vertebrae: Solid C5-C6 ACDF. No acute fracture. No definite suspicious bone lesion. Soft tissues and spinal canal: No prevertebral fluid or swelling. No visible canal hematoma. Disc levels: Multilevel degenerative change, better characterized on prior MRI. Upper chest: Visualized lung apices are clear. IMPRESSION: No evidence of acute abnormality intracranially or in the cervical spine. Electronically Signed   By: Feliberto Harts M.D.   On: 03/31/2023 12:49   CT Cervical Spine Wo Contrast  Result Date: 03/31/2023 CLINICAL [name]:  Ataxia, head trauma; Neck trauma, dangerous injury mechanism (Age 29-64y). EXAM: CT HEAD WITHOUT CONTRAST CT CERVICAL SPINE WITHOUT CONTRAST TECHNIQUE: Multidetector CT imaging of the head and cervical spine was performed following the standard protocol without intravenous contrast. Multiplanar CT image reconstructions of the cervical spine were also generated. RADIATION DOSE REDUCTION: This exam was performed according to the departmental dose-optimization program which  includes automated exposure control, adjustment of the mA and/or kV according to patient size and/or use of iterative reconstruction technique. COMPARISON:  PET CT May 31 24. FINDINGS: CT HEAD FINDINGS Brain: No evidence of acute infarction, hemorrhage, hydrocephalus, extra-axial collection or mass lesion/mass effect. Vascular: No hyperdense vessel.  Calcific atherosclerosis. Skull: No acute fracture. Sinuses/Orbits: Clear sinuses.  No acute orbital finding. Other: No mastoid effusions CT CERVICAL SPINE FINDINGS Alignment: No substantial sagittal subluxation. Skull base and vertebrae: Solid C5-C6 ACDF. No acute fracture. No definite suspicious bone lesion. Soft tissues and spinal canal: No prevertebral fluid or swelling. No visible canal hematoma. Disc levels: Multilevel degenerative change, better characterized on prior MRI. Upper chest: Visualized lung apices are clear. IMPRESSION: No evidence of acute abnormality intracranially or in the cervical spine. Electronically Signed   By: Feliberto Harts M.D.   On: 03/31/2023 12:49    EKG: Independently reviewed.  Sinus rhythm at 65 bpm.  Nonspecific T wave flattening.  Significant baseline artifact in multiple leads.  Assessment/Plan Active Problems:   Restless leg syndrome   Essential hypertension   Hyperlipidemia with target LDL less than 70   Type 2 diabetes mellitus (HCC)   Anxiety   Insomnia   Lung cancer, primary, with metastasis from lung to other site, left (HCC)   Metastasis to bone (HCC)  AKI Elevated CK > Creatinine elevated to 2.24 from baseline 1.1.  CK elevated to about thousand. > Received 1 L IV fluids in the ED.  Given multiple falls today will observe overnight for IV fluids. - Continue IV fluids overnight - Trend renal function and electrolytes - Repeat CK in the a.m.  Falls > Typically walks with a walker.  Had trouble getting through door frames with walker and  so uses a cane and her legs became weak and had falls today. >  As below, does have metastatic lung cancer to her pelvis and has a subsequent pathologic chronic acetabular fracture. > No acute fracture on imaging today. - PT/OT eval and treat  Lung cancer Bony metastases Chronic pain > Patient with known history of lung cancer since 2020 with bony metastases and chronic pain secondary to this. > Follows with oncology previously on chemotherapy and immunotherapy now on immunotherapy only every 3 weeks. - Noted - Continue with home as needed Norco - Continue home fentanyl patch (off since this am) - PRN dilaudid for severe breakthrough pain - Continuous pulse ox given medication for breakthrough pain.  Chronic Acetabular fracture > Significant and increasing chronic pain from this.  Is status post repair by [facility] orthopedics earlier this year. > Patient and family expressed concern over continued/worsening pain at her chronic fracture site.  They are requesting second opinion from orthopedic surgery here (original procedure was done at [facility]).  They understand that there may be nothing additional to be done but want to make sure. - I have placed consult to orthopedic provider on-call who recommends otpt follow up with Dr. Daneil Dolin. Ortho will be available for further questions if needed.  Hypertension - Holding home hydrochlorothiazide and lisinopril in the setting of AKI as above  Hyperlipidemia - Continue rosuvastatin  CAD > Status post stent. - Continue home Imdur, metoprolol, ASA, rosuvastatin - Holding lisinopril as above  Anxiety Insomnia - Continue home Xanax and trazodone  RLS - Continue home ropinirole  Diabetes - SSI  DVT prophylaxis: Lovenox Code Status:   Full Family Communication:  Updated at bedside  Disposition Plan:   Patient is from:  Home  Anticipated DC to:  Home  Anticipated DC date:  1 to 3 days  Anticipated DC barriers: None  Consults called:  None Admission status:  Observation,  MedSurg  Severity of Illness: The appropriate patient status for this patient is OBSERVATION. Observation status is judged to be reasonable and necessary in order to provide the required intensity of service to ensure the patient's safety. The patient's presenting symptoms, physical exam findings, and initial radiographic and laboratory [name] in the context of their medical condition is felt to place them at decreased risk for further clinical deterioration. Furthermore, it is anticipated that the patient will be medically stable for discharge from the hospital within 2 midnights of admission.    Synetta Fail MD [facility]  How to contact the [facility] Attending or Consulting provider 7A - 7P or covering provider during after hours 7P -7A, for this patient?   Check the care team in [facility] and look for a) attending/consulting [facility] provider listed and b) the [facility] team listed Log into [email or web address] and use [facility]'s universal password to access. If you do not have the password, please contact the hospital operator. Locate the [facility] provider you are looking for under [facility] and page to a number that you can be directly reached. If you still have difficulty reaching the provider, please page the [facility] (Director on Call) for the Hospitalists listed on amion for assistance.  03/31/2023, 2:21 PM

## 2023-03-31 NOTE — ED Triage Notes (Signed)
Pt BI [facility] from home d/t 2 mechanical falls that happened this morning. Pt landed on her L side. Pt ambulates w a cane. Pt's L leg looks shortened. Pulse intact. Denied LOC. Denied hitting her head. Neck pain . A&O X4. Hx L hip fracture.   Initla 88/56, went up 122/62 after fluids.  50 mcg fentnayl  4 mg zofran given by EMS

## 2023-03-31 NOTE — ED Notes (Signed)
ED TO INPATIENT HANDOFF REPORT  ED Nurse Name and Phone #: [phone]  S Name/Age/Gender [name] 88 y.o. female Room/Bed: 005C/005C  Code Status   Code Status: Full Code  Home/SNF/Other Home Patient oriented to: self, place, time, and situation Is this baseline? Yes   Triage Complete: Triage complete  Chief Complaint AKI (acute kidney injury) (HCC) [N17.9]  Triage Note Pt BI [facility] from home d/t 2 mechanical falls that happened this morning. Pt landed on her L side. Pt ambulates w a cane. Pt's L leg looks shortened. Pulse intact. Denied LOC. Denied hitting her head. Neck pain . A&O X4. Hx L hip fracture.   Initla 88/56, went up 122/62 after fluids.  50 mcg fentnayl  4 mg zofran given by EMS    Allergies Allergies  Allergen Reactions   Codeine Nausea And Vomiting and Other (See Comments)    Severe constipation.    Level of Care/Admitting Diagnosis ED Disposition     ED Disposition  Admit   Condition  --   Comment  Hospital [facility]: [facility] [[id number]]  Level of Care: Med-Surg [16]  May place patient in observation at [facility] or [facility] if equivalent level of care is available:: No  Covid Evaluation: Asymptomatic - no recent exposure (last 10 days) testing not required  Diagnosis: AKI (acute kidney injury) [facility]) [[id number]]  Admitting Physician: Synetta Fail [[id number]]  Attending Physician: Synetta Fail [[id number]]          B Medical/Surgery History Past Medical History:  Diagnosis Date   Aneurysm (HCC)    Right eye a non DES stent was placed so that she would not required long term dual antiplatelet therapy.   Anginal pain (HCC)    Anxiety    not currently taking any meds   Arthritis    CAD (coronary artery disease)    Diabetes mellitus without complication (HCC)    H/O hiatal hernia    Heart murmur    History of stress test 03/31/2012   The post stress myocardial perfusion images show a normal  pattern of perfusion in all region. The post left ventricles is normal in size. There is no scintigraphic of inductible myocardial ischemia. The post EF is 66   Hx of echocardiogram 11/29/2010   Ef 67% Normal size chambes, Aortic valve sclerosis without stenosis, No other significant valvular abnormalities, No percardial effusion.   Hyperlipemia    Hypertension    Insulin resistance    Lung cancer (HCC)    Multiple thyroid nodules    Neuromuscular disorder (HCC)    nerve pain after shingles   Osteoporosis    Restless legs    Shingles    Past Surgical History:  Procedure Laterality Date   ABDOMINAL HYSTERECTOMY  1970   ANGIOPLASTY     Stenting of a 90% eccentric right coronary artery stenosis and had a 3.0x15 mm Integrity bare-metal stent inserted   ANTERIOR CERVICAL DECOMP/DISCECTOMY FUSION N/A 07/30/2013   Procedure: ANTERIOR CERVICAL DECOMPRESSION/DISCECTOMY FUSION CERVICAL FIVE -SIX;  Surgeon: Tia Alert, MD;  Location: [facility] NEURO ORS;  Service: Neurosurgery;  Laterality: N/A;   APPENDECTOMY     BREAST SURGERY Left    cysts removed from left breast (in her 20'2)   CARDIAC CATHETERIZATION     Showed a widely patent stent, she did have 60% ostial diagonal-1 stenosis, She also had mild luminal irregularities of her LAD.   COLONOSCOPY     EYE SURGERY Bilateral 2009  cateract surgery- bilateral   IR IMAGING GUIDED PORT INSERTION  06/04/2019   LAMINECTOMY WITH POSTERIOR LATERAL ARTHRODESIS LEVEL 2 Right 07/18/2021   Procedure: Laminectomy and Foraminotomy - Lumbar four-Lumbar five - Lumbar five-Sacral one- right, posterolateral fusion Lumbar four-five with interspinous plate;  Surgeon: Tia Alert, MD;  Location: [facility] OR;  Service: Neurosurgery;  Laterality: Right;   LEFT HEART CATH AND CORONARY ANGIOGRAPHY N/A 11/15/2021   Procedure: LEFT HEART CATH AND CORONARY ANGIOGRAPHY;  Surgeon: Lennette Bihari, MD;  Location: [facility] INVASIVE CV LAB;  Service: Cardiovascular;  Laterality: N/A;    REFRACTIVE SURGERY Left 2011   TONSILLECTOMY       A IV Location/Drains/Wounds Patient Lines/Drains/Airways Status     Active Line/Drains/Airways     Name Placement date Placement time Site Days   Implanted Port 06/04/19 Right Chest 06/04/19  1225  Chest  1396   Peripheral IV 03/31/23 20 G Right Antecubital 03/31/23  1126  Antecubital  less than 1            Intake/Output Last 24 hours No intake or output data in the 24 hours ending 03/31/23 1430  Labs/Imaging Results for orders placed or performed during the hospital encounter of 03/31/23 (from the past 48 hour(s))  Basic metabolic panel     Status: Abnormal   Collection Time: 03/31/23 11:45 AM  Result Value Ref Range   Sodium 139 135 - 145 mmol/L   Potassium 3.9 3.5 - 5.1 mmol/L   Chloride 102 98 - 111 mmol/L   CO2 23 22 - 32 mmol/L   Glucose, Bld 127 (H) 70 - 99 mg/dL    Comment: Glucose reference range applies only to samples taken after fasting for at least 8 hours.   BUN 43 (H) 8 - 23 mg/dL   Creatinine, Ser 7.10 (H) 0.44 - 1.00 mg/dL   Calcium 9.9 8.9 - 62.6 mg/dL   GFR, Estimated 20 (L) >60 mL/min    Comment: (NOTE) Calculated using the CKD-EPI Creatinine Equation (2021)    Anion gap 14 5 - 15    Comment: Performed at [facility], [address] [place], [place] [address]  CBC with Differential     Status: Abnormal   Collection Time: 03/31/23 11:45 AM  Result Value Ref Range   WBC 5.2 4.0 - 10.5 K/uL   RBC 3.23 (L) 3.87 - 5.11 MIL/uL   Hemoglobin 9.2 (L) 12.0 - 15.0 g/dL   HCT 62.7 (L) 03.5 - 00.9 %   MCV 92.3 80.0 - 100.0 fL   MCH 28.5 26.0 - 34.0 pg   MCHC 30.9 30.0 - 36.0 g/dL   RDW 38.1 82.9 - 93.7 %   Platelets 186 150 - 400 K/uL   nRBC 0.0 0.0 - 0.2 %   Neutrophils Relative % 81 %   Neutro Abs 4.2 1.7 - 7.7 K/uL   Lymphocytes Relative 8 %   Lymphs Abs 0.4 (L) 0.7 - 4.0 K/uL   Monocytes Relative 10 %   Monocytes Absolute 0.5 0.1 - 1.0 K/uL   Eosinophils Relative 1 %   Eosinophils  Absolute 0.0 0.0 - 0.5 K/uL   Basophils Relative 0 %   Basophils Absolute 0.0 0.0 - 0.1 K/uL   Immature Granulocytes 0 %   Abs Immature Granulocytes 0.02 0.00 - 0.07 K/uL    Comment: Performed at [facility], [address] [place], [place] [address]  CK     Status: Abnormal   Collection Time: 03/31/23  11:45 AM  Result Value Ref Range   Total CK 1,001 (H) 38 - 234 U/L    Comment: Performed at [facility], [address], [place], [place] [address]   *Note: Due to a large number of results and/or encounters for the requested time period, some results have not been displayed. A complete set of results can be found in Results Review.   CT PELVIS WO CONTRAST  Result Date: 03/31/2023 CLINICAL DATA:  Pelvic fracture. EXAM: CT PELVIS WITHOUT CONTRAST TECHNIQUE: Multidetector CT imaging of the pelvis was performed following the standard protocol without intravenous contrast. RADIATION DOSE REDUCTION: This exam was performed according to the departmental dose-optimization program which includes automated exposure control, adjustment of the mA and/or kV according to patient size and/or use of iterative reconstruction technique. COMPARISON:  CT pelvis and left hip 11/02/2022, PET-CT 12/29/2022 and left hip radiographs 01/20/2023. FINDINGS: Urinary Tract: The visualized distal ureters and bladder appear unremarkable. Bowel: No bowel wall thickening, distention or surrounding inflammation identified within the pelvis. Mild distal colonic diverticulosis. Vascular/Lymphatic: No enlarged pelvic lymph nodes identified. Aortoiliac atherosclerosis without evidence of aneurysm. Reproductive: Hysterectomy.  No adnexal mass. Other: No evidence of pelvic ascites or pneumoperitoneum. Musculoskeletal: Unchanged position of the hardware status post left iliac and superior pubic ramus screw fixation. There is no hardware loosening. The inferior left iliac screw traverses the left sacroiliac joint, as before. The  left superior pubic ramus screw traverses the superior acetabulum, as before. There is a new small amount of soft tissue thickening around the head of this screw. Previously demonstrated comminuted left acetabular fracture demonstrates mildly progressive displacement, with up to 7 mm of displacement along the pelvic sidewall on image 69/4. No interval fracture healing identified. No new fractures are identified. There is no evidence of proximal femur fracture. Probable small left hip joint effusion with synovial thickening. No new lytic or blastic lesions are identified. There are stable degenerative changes within the sacroiliac joints and lower lumbar spine status post lower lumbar interspinous fusion. No significant pelvic or proximal thigh soft tissue hematoma or focal muscular atrophy. IMPRESSION: 1. Mildly progressive displacement of the comminuted left acetabular fracture status post ORIF. No interval fracture healing identified. 2. No new fractures are identified. 3. No evidence of hardware loosening. 4.  Aortic Atherosclerosis (ICD10-I70.0). Electronically Signed   By: Carey Bullocks M.D.   On: 03/31/2023 13:00   CT Head Wo Contrast  Result Date: 03/31/2023 CLINICAL DATA:  Ataxia, head trauma; Neck trauma, dangerous injury mechanism (Age 59-64y). EXAM: CT HEAD WITHOUT CONTRAST CT CERVICAL SPINE WITHOUT CONTRAST TECHNIQUE: Multidetector CT imaging of the head and cervical spine was performed following the standard protocol without intravenous contrast. Multiplanar CT image reconstructions of the cervical spine were also generated. RADIATION DOSE REDUCTION: This exam was performed according to the departmental dose-optimization program which includes automated exposure control, adjustment of the mA and/or kV according to patient size and/or use of iterative reconstruction technique. COMPARISON:  PET CT May 31 24. FINDINGS: CT HEAD FINDINGS Brain: No evidence of acute infarction, hemorrhage,  hydrocephalus, extra-axial collection or mass lesion/mass effect. Vascular: No hyperdense vessel.  Calcific atherosclerosis. Skull: No acute fracture. Sinuses/Orbits: Clear sinuses.  No acute orbital finding. Other: No mastoid effusions CT CERVICAL SPINE FINDINGS Alignment: No substantial sagittal subluxation. Skull base and vertebrae: Solid C5-C6 ACDF. No acute fracture. No definite suspicious bone lesion. Soft tissues and spinal canal: No prevertebral fluid or swelling. No visible canal hematoma. Disc levels: Multilevel degenerative change,  better characterized on prior MRI. Upper chest: Visualized lung apices are clear. IMPRESSION: No evidence of acute abnormality intracranially or in the cervical spine. Electronically Signed   By: Feliberto Harts M.D.   On: 03/31/2023 12:49   CT Cervical Spine Wo Contrast  Result Date: 03/31/2023 CLINICAL DATA:  Ataxia, head trauma; Neck trauma, dangerous injury mechanism (Age 62-64y). EXAM: CT HEAD WITHOUT CONTRAST CT CERVICAL SPINE WITHOUT CONTRAST TECHNIQUE: Multidetector CT imaging of the head and cervical spine was performed following the standard protocol without intravenous contrast. Multiplanar CT image reconstructions of the cervical spine were also generated. RADIATION DOSE REDUCTION: This exam was performed according to the departmental dose-optimization program which includes automated exposure control, adjustment of the mA and/or kV according to patient size and/or use of iterative reconstruction technique. COMPARISON:  PET CT May 31 24. FINDINGS: CT HEAD FINDINGS Brain: No evidence of acute infarction, hemorrhage, hydrocephalus, extra-axial collection or mass lesion/mass effect. Vascular: No hyperdense vessel.  Calcific atherosclerosis. Skull: No acute fracture. Sinuses/Orbits: Clear sinuses.  No acute orbital finding. Other: No mastoid effusions CT CERVICAL SPINE FINDINGS Alignment: No substantial sagittal subluxation. Skull base and vertebrae: Solid C5-C6  ACDF. No acute fracture. No definite suspicious bone lesion. Soft tissues and spinal canal: No prevertebral fluid or swelling. No visible canal hematoma. Disc levels: Multilevel degenerative change, better characterized on prior MRI. Upper chest: Visualized lung apices are clear. IMPRESSION: No evidence of acute abnormality intracranially or in the cervical spine. Electronically Signed   By: Feliberto Harts M.D.   On: 03/31/2023 12:49    Pending Labs Unresulted Labs (From admission, onward)     Start     Ordered   04/07/23 0500  Creatinine, serum  (enoxaparin (LOVENOX)    CrCl < 30 ml/min)  Once,   R       Comments: while on enoxaparin therapy.    03/31/23 1422   04/01/23 0500  Comprehensive metabolic panel  Tomorrow morning,   R        03/31/23 1422   04/01/23 0500  CBC  Tomorrow morning,   R        03/31/23 1422   04/01/23 0500  CK  Tomorrow morning,   R        03/31/23 1429   03/31/23 1142  Urinalysis, Routine w reflex microscopic -Urine, Clean Catch  Once,   URGENT       Question:  Specimen Source  Answer:  Urine, Clean Catch   03/31/23 1142            Vitals/Pain Today's Vitals   03/31/23 1345 03/31/23 1405 03/31/23 1415 03/31/23 1430  BP: 129/62  (!) 135/58   Pulse: 70 73 75 74  Resp: 16 17 (!) 21 20  Temp:      TempSrc:      SpO2: 98% 98% 98% 95%    Isolation Precautions No active isolations  Medications Medications  aspirin EC tablet 81 mg (has no administration in time range)  HYDROcodone-acetaminophen (NORCO) 10-325 MG per tablet 1 tablet (has no administration in time range)  isosorbide mononitrate (IMDUR) 24 hr tablet 30 mg (has no administration in time range)  metoprolol tartrate (LOPRESSOR) tablet 12.5 mg (has no administration in time range)  rosuvastatin (CRESTOR) tablet 40 mg (has no administration in time range)  ALPRAZolam (XANAX) tablet 0.5 mg (has no administration in time range)  traZODone (DESYREL) tablet 100 mg (has no administration in time  range)  traZODone (DESYREL) tablet 50 mg (has no  administration in time range)  loperamide (IMODIUM) capsule 2-4 mg (has no administration in time range)  ondansetron (ZOFRAN) tablet 8 mg (has no administration in time range)  rOPINIRole (REQUIP) tablet 1 mg (has no administration in time range)  gabapentin (NEURONTIN) capsule 300 mg (has no administration in time range)  carboxymethylcellulose (REFRESH PLUS) 0.5 % ophthalmic solution 1 drop (has no administration in time range)  enoxaparin (LOVENOX) injection 30 mg (has no administration in time range)  sodium chloride flush (NS) 0.9 % injection 3 mL (has no administration in time range)  acetaminophen (TYLENOL) tablet 650 mg (has no administration in time range)    Or  acetaminophen (TYLENOL) suppository 650 mg (has no administration in time range)  polyethylene glycol (MIRALAX / GLYCOLAX) packet 17 g (has no administration in time range)  0.9 %  sodium chloride infusion (has no administration in time range)  morphine (PF) 4 MG/ML injection 4 mg (4 mg Intravenous Given 03/31/23 1245)  ondansetron (ZOFRAN) injection 4 mg (4 mg Intravenous Given 03/31/23 1245)  lactated ringers bolus 1,000 mL (1,000 mLs Intravenous New Bag/Given 03/31/23 1348)  morphine (PF) 4 MG/ML injection 4 mg (4 mg Intravenous Given 03/31/23 1348)    Mobility non-ambulatory     Focused Assessments    R Recommendations: See Admitting Provider Note  Report given to:   Additional Notes:

## 2023-04-01 ENCOUNTER — Other Ambulatory Visit: Payer: Self-pay | Admitting: Oncology

## 2023-04-01 DIAGNOSIS — M80052A Age-related osteoporosis with current pathological fracture, left femur, initial encounter for fracture: Secondary | ICD-10-CM | POA: Diagnosis present

## 2023-04-01 DIAGNOSIS — E88819 Insulin resistance, unspecified: Secondary | ICD-10-CM | POA: Diagnosis present

## 2023-04-01 DIAGNOSIS — R52 Pain, unspecified: Secondary | ICD-10-CM | POA: Diagnosis not present

## 2023-04-01 DIAGNOSIS — F419 Anxiety disorder, unspecified: Secondary | ICD-10-CM | POA: Diagnosis present

## 2023-04-01 DIAGNOSIS — E785 Hyperlipidemia, unspecified: Secondary | ICD-10-CM | POA: Diagnosis present

## 2023-04-01 DIAGNOSIS — K5903 Drug induced constipation: Secondary | ICD-10-CM | POA: Diagnosis not present

## 2023-04-01 DIAGNOSIS — Z923 Personal history of irradiation: Secondary | ICD-10-CM | POA: Diagnosis not present

## 2023-04-01 DIAGNOSIS — Z66 Do not resuscitate: Secondary | ICD-10-CM | POA: Diagnosis present

## 2023-04-01 DIAGNOSIS — I10 Essential (primary) hypertension: Secondary | ICD-10-CM | POA: Diagnosis present

## 2023-04-01 DIAGNOSIS — R531 Weakness: Secondary | ICD-10-CM | POA: Diagnosis not present

## 2023-04-01 DIAGNOSIS — G4700 Insomnia, unspecified: Secondary | ICD-10-CM | POA: Diagnosis present

## 2023-04-01 DIAGNOSIS — M6282 Rhabdomyolysis: Secondary | ICD-10-CM | POA: Diagnosis present

## 2023-04-01 DIAGNOSIS — C7951 Secondary malignant neoplasm of bone: Secondary | ICD-10-CM | POA: Diagnosis present

## 2023-04-01 DIAGNOSIS — M25552 Pain in left hip: Secondary | ICD-10-CM | POA: Diagnosis not present

## 2023-04-01 DIAGNOSIS — M7989 Other specified soft tissue disorders: Secondary | ICD-10-CM | POA: Diagnosis not present

## 2023-04-01 DIAGNOSIS — I2510 Atherosclerotic heart disease of native coronary artery without angina pectoris: Secondary | ICD-10-CM | POA: Diagnosis present

## 2023-04-01 DIAGNOSIS — Y9301 Activity, walking, marching and hiking: Secondary | ICD-10-CM | POA: Diagnosis present

## 2023-04-01 DIAGNOSIS — Z515 Encounter for palliative care: Secondary | ICD-10-CM | POA: Diagnosis not present

## 2023-04-01 DIAGNOSIS — E119 Type 2 diabetes mellitus without complications: Secondary | ICD-10-CM | POA: Diagnosis present

## 2023-04-01 DIAGNOSIS — Y92002 Bathroom of unspecified non-institutional (private) residence single-family (private) house as the place of occurrence of the external cause: Secondary | ICD-10-CM | POA: Diagnosis not present

## 2023-04-01 DIAGNOSIS — S80811A Abrasion, right lower leg, initial encounter: Secondary | ICD-10-CM | POA: Diagnosis present

## 2023-04-01 DIAGNOSIS — Z9221 Personal history of antineoplastic chemotherapy: Secondary | ICD-10-CM | POA: Diagnosis not present

## 2023-04-01 DIAGNOSIS — C3492 Malignant neoplasm of unspecified part of left bronchus or lung: Secondary | ICD-10-CM

## 2023-04-01 DIAGNOSIS — S32492A Other specified fracture of left acetabulum, initial encounter for closed fracture: Secondary | ICD-10-CM | POA: Diagnosis present

## 2023-04-01 DIAGNOSIS — G893 Neoplasm related pain (acute) (chronic): Secondary | ICD-10-CM | POA: Diagnosis present

## 2023-04-01 DIAGNOSIS — L03115 Cellulitis of right lower limb: Secondary | ICD-10-CM | POA: Diagnosis present

## 2023-04-01 DIAGNOSIS — Z8616 Personal history of COVID-19: Secondary | ICD-10-CM | POA: Diagnosis not present

## 2023-04-01 DIAGNOSIS — N179 Acute kidney failure, unspecified: Secondary | ICD-10-CM | POA: Diagnosis present

## 2023-04-01 DIAGNOSIS — Z87891 Personal history of nicotine dependence: Secondary | ICD-10-CM | POA: Diagnosis not present

## 2023-04-01 DIAGNOSIS — Z23 Encounter for immunization: Secondary | ICD-10-CM | POA: Diagnosis present

## 2023-04-01 DIAGNOSIS — G2581 Restless legs syndrome: Secondary | ICD-10-CM | POA: Diagnosis present

## 2023-04-01 DIAGNOSIS — W1830XA Fall on same level, unspecified, initial encounter: Secondary | ICD-10-CM | POA: Diagnosis present

## 2023-04-01 LAB — COMPREHENSIVE METABOLIC PANEL
ALT: 24 U/L (ref 0–44)
AST: 32 U/L (ref 15–41)
Albumin: 2.7 g/dL — ABNORMAL LOW (ref 3.5–5.0)
Alkaline Phosphatase: 72 U/L (ref 38–126)
Anion gap: 8 (ref 5–15)
BUN: 33 mg/dL — ABNORMAL HIGH (ref 8–23)
CO2: 22 mmol/L (ref 22–32)
Calcium: 9.3 mg/dL (ref 8.9–10.3)
Chloride: 109 mmol/L (ref 98–111)
Creatinine, Ser: 1.78 mg/dL — ABNORMAL HIGH (ref 0.44–1.00)
GFR, Estimated: 28 mL/min — ABNORMAL LOW (ref >60)
Glucose, Bld: 96 mg/dL (ref 70–99)
Potassium: 3.8 mmol/L (ref 3.5–5.1)
Sodium: 139 mmol/L (ref 135–145)
Total Bilirubin: 0.5 mg/dL (ref 0.3–1.2)
Total Protein: 5.5 g/dL — ABNORMAL LOW (ref 6.5–8.1)

## 2023-04-01 LAB — CBC
HCT: 29.7 % — ABNORMAL LOW (ref 36.0–46.0)
Hemoglobin: 9.3 g/dL — ABNORMAL LOW (ref 12.0–15.0)
MCH: 29.1 pg (ref 26.0–34.0)
MCHC: 31.3 g/dL (ref 30.0–36.0)
MCV: 92.8 fL (ref 80.0–100.0)
Platelets: 147 10*3/uL — ABNORMAL LOW (ref 150–400)
RBC: 3.2 MIL/uL — ABNORMAL LOW (ref 3.87–5.11)
RDW: 14.1 % (ref 11.5–15.5)
WBC: 4.2 10*3/uL (ref 4.0–10.5)
nRBC: 0 % (ref 0.0–0.2)

## 2023-04-01 LAB — CK: Total CK: 700 U/L — ABNORMAL HIGH (ref 38–234)

## 2023-04-01 LAB — GLUCOSE, CAPILLARY
Glucose-Capillary: 92 mg/dL (ref 70–99)
Glucose-Capillary: 114 mg/dL — ABNORMAL HIGH (ref 70–99)
Glucose-Capillary: 143 mg/dL — ABNORMAL HIGH (ref 70–99)
Glucose-Capillary: 133 mg/dL — ABNORMAL HIGH (ref 70–99)

## 2023-04-01 MED ORDER — SODIUM CHLORIDE 0.9 % IV SOLN
INTRAVENOUS | Status: DC
  Administered 2023-04-02: 1000 mL via INTRAVENOUS

## 2023-04-01 MED ORDER — HYDROMORPHONE HCL 1 MG/ML IJ SOLN
0.5000 mg | INTRAMUSCULAR | Status: DC | PRN
  Administered 2023-04-01 – 2023-04-07 (×2): 0.5 mg via INTRAVENOUS
  Filled 2023-04-01 (×3): qty 1

## 2023-04-01 MED ORDER — POLYETHYLENE GLYCOL 3350 17 G PO PACK
17.0000 g | PACK | Freq: Every day | ORAL | Status: DC
  Administered 2023-04-01 – 2023-04-07 (×6): 17 g via ORAL
  Filled 2023-04-01 (×8): qty 1

## 2023-04-01 MED ORDER — MUPIROCIN 2 % EX OINT
TOPICAL_OINTMENT | Freq: Every day | CUTANEOUS | Status: DC
  Administered 2023-04-01: 1 via TOPICAL
  Filled 2023-04-01 (×3): qty 22

## 2023-04-01 NOTE — Consult Note (Signed)
Palliative Care Consult Note                                  Date: 04/01/2023   Patient Name: [name]  DOB: 05-Jul-1935  MRN: [id number]  Age / Sex: 88 y.o., female  PCP: Garlan Fillers, MD Referring Physician: Dorcas Carrow, MD  Reason for Consultation: Pain control  HPI/Patient Profile: 88 y.o. female  with past medical history of hypertension, hyperlipidemia, type 2 diabetes, coronary artery disease, lung cancer with bony mets status postchemotherapy and currently on immunotherapy, anxiety, insomnia, cervical spine disease status post surgical interventions, chronic left acetabular fracture admitted on 03/31/2023 from home after 2 falls.    Patient family expressed concern over continued/worsening pain at her chronic fracture site. They are requesting second opinion from orthopedic surgery here (original procedure was done at [facility]). They understand that there may be nothing additional to be done but want to make sure.   Past Medical History:  Diagnosis Date   Aneurysm (HCC)    Right eye a non DES stent was placed so that she would not required long term dual antiplatelet therapy.   Anginal pain (HCC)    Anxiety    not currently taking any meds   Arthritis    CAD (coronary artery disease)    Diabetes mellitus without complication (HCC)    H/O hiatal hernia    Heart murmur    History of stress test 03/31/2012   The post stress myocardial perfusion images show a normal pattern of perfusion in all region. The post left ventricles is normal in size. There is no scintigraphic of inductible myocardial ischemia. The post EF is 16   Hx of echocardiogram 11/29/2010   Ef 67% Normal size chambes, Aortic valve sclerosis without stenosis, No other significant valvular abnormalities, No percardial effusion.   Hyperlipemia    Hypertension    Insulin resistance    Lung cancer (HCC)    Multiple thyroid nodules     Neuromuscular disorder (HCC)    nerve pain after shingles   Osteoporosis    Restless legs    Shingles     Subjective:   I have reviewed medical records including EPIC notes, labs and imaging, assessed the patient and then met with the patient's daughter [name] to discuss symptom management. The patient was sleepy and unable to stay awake during the conversation  I introduced Palliative Medicine as specialized medical care for people living with serious illness. It focuses on providing relief from symptoms and stress of a serious illness. The goal is to improve quality of life for both the patient and the family.  Functional Status:  Patient lives at home independently but lives near her daughter. The patient's husband passed away 4 months ago. Her daugher shares this has been difficult for the patient. She uses a walker at home.  Palliative Symptoms: Pain.  The pain that causes the patient the most distress is the pain in her left hip. She has described the pain as throbbing. On an average day it is a 6/10 and is a 5/10 at its best. She would be happy with a 2-3/10. At times she also has pain in her back and shoulder. Her daughter states the patients quality of life is greatly impacted by her continued pain.  Before admission she was on a 25 mcg/hr fentanyl patch (daughter states it is no longer on her and  has not been since they got to the room). The patient does not like how the fentanyl patch makes her feel. It makes her feel weak, "loopy," and she has had tremors since using it. She has also been using hydrocodone-acetaminophen 10-325 (Norco) and takes 5-8 pills per day. She takes 300mg  gabapentin at night. She was working with her oncologist prior to admission to optomize her pain management.  In the ED she received IV morphine. Since then she has requested two Norco doses and one dilaudid dose. Her daughter says that since she has been admitted the patient has been sleeping more  restfully than usual and has been in less pain.  We discussed that we would like to find a pain regimen that works for her with the fewest symptoms. I told her I would have a colleague follow up tomorrow after we have 24 hours of prn administration to find her 24 hour OME and adjust the regimen as needed then.  Constipation. We also discussed constipation management. She uses miralax almost daily at home. I will change her prn miralax to scheduled.   Discussed the importance of continued conversation with family and the medical providers regarding pain management, plan of care, and treatment options.  Questions and concerns were addressed.The family was encouraged to call with questions or concerns. PMT will continue to support holistically.  Review of Systems  Unable to perform ROS   Objective:   Primary Diagnoses: Present on Admission:  Essential hypertension  Hyperlipidemia with target LDL less than 70  Lung cancer, primary, with metastasis from lung to other site, left (HCC)  Metastasis to bone (HCC)  Anxiety  Insomnia  Restless leg syndrome  AKI (acute kidney injury) [facility])   Physical Exam Vitals reviewed.  Constitutional:      General: She is sleeping.  HENT:     Head: Normocephalic and atraumatic.  Cardiovascular:     Rate and Rhythm: Normal rate.  Pulmonary:     Effort: Pulmonary effort is normal.  Neurological:     Mental Status: She is oriented to person, place, and time.  Psychiatric:        Mood and Affect: Mood normal.        Behavior: Behavior normal.     Vital Signs:  BP (!) 135/57 (BP Location: Right Arm)   Pulse 71   Temp 98.1 F (36.7 C)   Resp 18   SpO2 95%     Assessment & Plan:   SUMMARY OF RECOMMENDATIONS   Continued PMT support: Follow up 9/2 for 24 hour OME and pain management changes.   Symptom Management:  Schedule Miralax Change dilaudid to available q4hrs prn   Discussed with: Dr. Corrie Mckusick  Time Total: 60  minutes   Thank you for allowing [place] to participate in the care of [name] PMT will continue to support holistically.      Signed by: Sarina Ser, NP Palliative Medicine Team  Team Phone # [phone] (Nights/Weekends)  04/01/2023, 3:12 PM

## 2023-04-01 NOTE — TOC Initial Note (Signed)
Transition of Care [facility]) - Initial/Assessment Note    Patient Details  Name: [name] MRN: [id number] Date of Birth: 30-Mar-1935  Transition [facility] CM/SW Contact:    Ronny Bacon, RN Phone Number: 04/01/2023, 4:02 PM  Clinical Narrative:   Spoke with patient by phone. Patient has no history of HH services and no preference on company to use. Message sent to Angie- [facility] to see if can accept patient for [facility] services, awaiting response. BSC 3:1 ordered through [employer].                Expected Discharge Plan: Home w Home Health Services Barriers to Discharge: Continued Medical Work up   Patient Goals and CMS Choice            Expected Discharge Plan and Services       Living arrangements for the past 2 months: Single Family Home                 DME Arranged: 3-N-1 DME Agency: [employer] Date DME Agency Contacted: 04/01/23 Time DME Agency Contacted: 1601 Representative spoke with at DME Agency: Vaughan Basta            Prior Living Arrangements/Services Living arrangements for the past 2 months: Single Family Home Lives with:: Self Patient language and need for interpreter reviewed:: Yes Do you feel safe going back to the place where you live?: Yes      Need for Family Participation in Patient Care: Yes (Comment) Care giver support system in place?: Yes (comment) Current home services: DME (walker, cane) Criminal Activity/Legal Involvement Pertinent to Current Situation/Hospitalization: No - Comment as needed  Activities of Daily Living      Permission Sought/Granted                  Emotional Assessment   Attitude/Demeanor/Rapport: Engaged   Orientation: : Oriented to Self, Oriented to Place, Oriented to  Time, Oriented to Situation Alcohol / Substance Use: Not Applicable Psych Involvement: No (comment)  Admission diagnosis:  AKI (acute kidney injury) (HCC) [N17.9] Acute kidney injury (HCC)  [N17.9] Patient Active Problem List   Diagnosis Date Noted   AKI (acute kidney injury) (HCC) 03/31/2023   Metastasis to bone (HCC) 04/20/2022   S/P lumbar laminectomy 07/18/2021   Port-A-Cath in place 07/18/2019   Lung cancer, primary, with metastasis from lung to other site, left (HCC) 05/29/2019   Pain in joint of right hip 04/18/2018   Anxiety 06/30/2016   Insomnia 06/30/2016   Nasolacrimal duct obstruction, acquired 02/21/2016   Type 2 diabetes mellitus (HCC) 12/18/2015   S/P cervical spinal fusion 07/30/2013   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 [place] [address] Phone: [phone] Fax: [phone]  [facility]) - [place], [place] - [address] [facility] [address] [place] [address] [place] [address] Phone: [phone] Fax: [phone]  [facility] - [place], [place] - [address] [place] [address] Phone: [phone] Fax: [phone]     Social Determinants of Health (SDOH) Social History: SDOH Screenings  Food Insecurity: No Food Insecurity (03/18/2021)  Housing: Low Risk  (03/18/2021)  Transportation Needs: No Transportation Needs (03/18/2021)  Tobacco Use: Medium Risk (03/14/2023)   SDOH Interventions:     Readmission Risk Interventions     No data to display

## 2023-04-01 NOTE — Evaluation (Signed)
Physical Therapy Evaluation Patient Details Name: [name] MRN: [id number] DOB: 02-22-1935 Today's Date: 04/01/2023  History of Present Illness  Pt is an 88 y.o. female who presented to the ED 8/31 with EMS transport from home after two falls on the same day with pt requiring assistance to get up from second fall. Pt with chronic Left acetabular fracture with hx of ORIF. PMH: hypertension, hyperlipidemia, diabetes, CAD status post stent, lung cancer with bone mets status post chemo and continued on immunotherapy, anxiety, insomnia, RLS, cervical spine disease status post surgical intervention  Clinical Impression   Pt admitted with above diagnosis. Lives at home alone, in a single-level home with 3 steps to enter; Prior to admission, pt was able to manage modified independently in the home; Presents to PT with generalized weakness, L hip pain affecting functional mobility;  Today, she needed minguard assist to stand from recliner using armrests, and walked to the hallway with contact guard assist; Reported the act of walking helped her to feel better; Pt currently with functional limitations due to the deficits listed below (see PT Problem List). Pt will benefit from skilled PT to increase their independence and safety with mobility to allow discharge to the venue listed below.           If plan is discharge home, recommend the following: A little help with walking and/or transfers   Can travel by private vehicle        Equipment Recommendations Other (comment);BSC/3in1; RW -- as pt imporves at home, consider going from rollator to using RW in the bathroom, instead of a cane  Recommendations for Other Services       Functional Status Assessment Patient has had a recent decline in their functional status and demonstrates the ability to make significant improvements in function in a reasonable and predictable amount of time.     Precautions / Restrictions Precautions Precautions:  Fall Restrictions Weight Bearing Restrictions: No Other Position/Activity Restrictions: Ortho Traumatologist consult pending      Mobility  Bed Mobility                    Transfers Overall transfer level: Needs assistance Equipment used: Rolling walker (2 wheels) Transfers: Sit to/from Stand, Bed to chair/wheelchair/BSC Sit to Stand: Contact guard assist   Step pivot transfers: Contact guard assist       General transfer comment: Pt with L hip pain worse in standing versus stepping    Ambulation/Gait Ambulation/Gait assistance: Contact guard assist Gait Distance (Feet): 50 Feet Assistive device: Rolling walker (2 wheels) Gait Pattern/deviations: Step-through pattern, Decreased step length - right, Decreased step length - left       General Gait Details: Cues for sequencing to use RW to unweigh painful L hip in stance  Stairs            Wheelchair Mobility     Tilt Bed    Modified Rankin (Stroke Patients Only)       Balance Overall balance assessment: Needs assistance, History of Falls Sitting-balance support: No upper extremity supported, Feet supported Sitting balance-Leahy Scale: Good Sitting balance - Comments: sitting EOB   Standing balance support: Single extremity supported, Bilateral upper extremity supported, During functional activity, Reliant on assistive device for balance Standing balance-Leahy Scale: Poor Standing balance comment: Pain in L hip affecting standing tolerance  Pertinent Vitals/Pain Pain Assessment Pain Assessment: Faces Faces Pain Scale: Hurts whole lot (L hip - 4 supine in bed, 8 in standing; B shoulders with ROM - 2) Pain Location: Left hip, also general discomfort Pain Descriptors / Indicators: Discomfort, Grimacing, Guarding, Aching, Sore    Home Living Family/patient expects to be discharged to:: Private residence Living Arrangements: Alone Available Help at  Discharge: Family;Available PRN/intermittently (Daughter reports family can provide assistance for showers) Type of Home: Other(Comment) (townhouse) Home Access: Stairs to enter Entrance Stairs-Rails: Doctor, general practice of Steps: 3   Home Layout: One level Home Equipment: Shower seat - built in;Rollator (4 wheels);Grab bars - tub/shower;Hand held shower head;Wheelchair - power;Other (comment) (Huricane) Additional Comments: Plans to move to ALF when room becomes available.    Prior Function Prior Level of Function : Independent/Modified Independent;History of Falls (last six months)             Mobility Comments: Pt uses a Rollator for funcitonal mobility, but uses cane going in/out of bathrooms as door frames are too narrow for the Rollator. Pt with multiple falls with pt reporting all falls occures when using can going in/out of bathroom. Family recently purchased small power wheelchair for longer distances and use in the community. ADLs Comments: Pt Independent to Mod I with ADLs with pt reporting L hip pain often makes LB dressing/bathing difficult.     Extremity/Trunk Assessment   Upper Extremity Assessment Upper Extremity Assessment: Defer to OT evaluation RUE Deficits / Details: generalized weakness; mild pain/soreness in biceps and upper back with AROM following recent fall with pt pulling herself back up using Rollator LUE Deficits / Details: generalized weakness; mild pain/soreness in biceps and upper back with AROM following recent fall with pt pulling herself back up using Rollator    Lower Extremity Assessment Lower Extremity Assessment: Generalized weakness;LLE deficits/detail LLE Deficits / Details: hip pain with weight bearing       Communication   Communication Communication: No apparent difficulties  Cognition Arousal: Alert (Pt with fatigue and lethargy by end of session) Behavior During Therapy: WFL for tasks assessed/performed Overall  Cognitive Status: Within Functional Limits for tasks assessed                                 General Comments: AAOx4 and pleasant throughout. Pt demonstrates cognition WNL.        General Comments General comments (skin integrity, edema, etc.): NAD on room air    Exercises     Assessment/Plan    PT Assessment Patient needs continued PT services  PT Problem List Decreased strength;Decreased range of motion;Decreased activity tolerance;Decreased balance;Decreased mobility;Decreased knowledge of use of DME;Decreased knowledge of precautions;Pain       PT Treatment Interventions DME instruction;Gait training;Stair training;Functional mobility training;Therapeutic activities;Therapeutic exercise;Balance training;Neuromuscular re-education;Cognitive remediation;Patient/family education    PT Goals (Current goals can be found in the Care Plan section)  Acute Rehab PT Goals Patient Stated Goal: Hopes to be able to move into [facility]F PT Goal Formulation: With patient Time For Goal Achievement: 04/15/23 Potential to Achieve Goals: Good    Frequency Min 1X/week     Co-evaluation               AM-PAC PT "6 Clicks" Mobility  Outcome Measure Help needed turning from your back to your side while in a flat bed without using bedrails?: None Help needed moving from lying on your back to  sitting on the side of a flat bed without using bedrails?: A Little Help needed moving to and from a bed to a chair (including a wheelchair)?: A Little Help needed standing up from a chair using your arms (e.g., wheelchair or bedside chair)?: A Little Help needed to walk in hospital room?: A Little Help needed climbing 3-5 steps with a railing? : A Little 6 Click Score: 19    End of Session Equipment Utilized During Treatment: Gait belt Activity Tolerance: Patient tolerated treatment well Patient left: in chair;with call bell/phone within reach;with family/visitor  present Nurse Communication: Mobility status PT Visit Diagnosis: Unsteadiness on feet (R26.81);History of falling (Z91.81);Pain Pain - Right/Left: Left Pain - part of body: Hip    Time: 4098-1191 PT Time Calculation (min) (ACUTE ONLY): 22 min   Charges:   PT Evaluation $PT Eval Moderate Complexity: 1 Mod   PT General Charges $$ ACUTE PT VISIT: 1 Visit         Van Clines, PT  Acute Rehabilitation Services Office [phone] Secure Chat welcomed   Levi Aland 04/01/2023, 4:53 PM

## 2023-04-01 NOTE — Consult Note (Signed)
WOC Nurse Consult Note: Reason for Consult:Right lower leg wound after fall at home.  Wound type:trauma/abrasion Pressure Injury POA: NA Measurement: see flow sheet Wound bed: red and moist Drainage (amount, consistency, odor) minimal serosanguinous  no odor Periwound: intact Dressing procedure/placement/frequency: Cleanse abrasion to right lower leg with NS and pat dry. Apply mepitel nonadherent  gauze (LAWSON # [id number]) and mupirocin ointment.  Cover with gauze and kerlix/tape.  Change every day.  Will not follow at this time.  Please re-consult if needed.  Mike Gip MSN, RN, FNP-BC CWON Wound, Ostomy, Continence Nurse Outpatient [facility] [phone] Pager [phone]

## 2023-04-01 NOTE — Evaluation (Signed)
Occupational Therapy Evaluation Patient Details Name: [name] MRN: [id number] DOB: 12/20/1934 Today's Date: 04/01/2023   History of Present Illness Pt is an 88 y.o. female who presented to the ED 8/31 with EMS transport from home after two falls on the same day with pt requiring assistance to get up from second fall. Pt with chronic Left acetabular fracture with hx of ORIF. PMH: hypertension, hyperlipidemia, diabetes, CAD status post stent, lung cancer with bone mets status post chemo and continued on immunotherapy, anxiety, insomnia, RLS, cervical spine disease status post surgical intervention   Clinical Impression   At baseline, pt completes ADLs Independent to Mod I and functional transfers/mobility household distances with a Rollator with Mod I. At baseline, pt occasional receives assistance from family for IADLs as needed. Pt with history of multiple falls, including two on same day leading to this hospitalization. Pt reports all falls occurred when going in/out of bathrooms as her Rollator does not fit through the door frame and she needs to use a Huricane instead. Pt now presents with decreased activity tolerance, generalized B UE weakness, significant pain in Left hp, decreased standing balance and tolerance during functional tasks, and decreased safety and independence with ADLs and functional transfers/mobility. Pt currently demonstrates ability to complete UB ADLs Independent to Set up, LB ADLs with Contact guard to Min assist, and functional transfers with a RW with Contact guard assist. Pt will benefit from acute skilled OT services to address deficits outlined below and increase safety and independence with ADLs, functional transfers, and functional mobility. Post acute discharge, pt will benefit from continued skilled OT services in the home to maximize rehab potential.       If plan is discharge home, recommend the following: A little help with walking and/or transfers;A  little help with bathing/dressing/bathroom;Assistance with cooking/housework;Assist for transportation;Help with stairs or ramp for entrance    Functional Status Assessment  Patient has had a recent decline in their functional status and demonstrates the ability to make significant improvements in function in a reasonable and predictable amount of time.  Equipment Recommendations  BSC/3in1    Recommendations for Other Services       Precautions / Restrictions Precautions Precautions: Fall Restrictions Weight Bearing Restrictions: No      Mobility Bed Mobility Overal bed mobility: Needs Assistance Bed Mobility: Supine to Sit, Sit to Supine     Supine to sit: Contact guard, HOB elevated, Used rails Sit to supine: Min assist, HOB elevated, Used rails   General bed mobility comments: Assist for managing B LE from sit to supine and cues for technique    Transfers Overall transfer level: Needs assistance Equipment used: Rolling walker (2 wheels) Transfers: Sit to/from Stand, Bed to chair/wheelchair/BSC Sit to Stand: Contact guard assist     Step pivot transfers: Contact guard assist     General transfer comment: Pt with L hip pain worse in standing versus stepping      Balance Overall balance assessment: Needs assistance, History of Falls Sitting-balance support: No upper extremity supported, Feet supported Sitting balance-Leahy Scale: Good Sitting balance - Comments: sitting EOB   Standing balance support: Single extremity supported, Bilateral upper extremity supported, During functional activity, Reliant on assistive device for balance Standing balance-Leahy Scale: Poor Standing balance comment: Pain in L hip affecting standing tolerance                           ADL either performed or assessed with  clinical judgement   ADL Overall ADL's : Needs assistance/impaired Eating/Feeding: Independent;Sitting   Grooming: Independent;Sitting   Upper Body  Bathing: Supervision/ safety;Sitting   Lower Body Bathing: Minimal assistance;Sit to/from stand;Cueing for compensatory techniques   Upper Body Dressing : Set up;Sitting   Lower Body Dressing: Minimal assistance;Cueing for compensatory techniques;Sitting/lateral leans;Sit to/from stand   Toilet Transfer: Contact guard assist;BSC/3in1;Rolling walker (2 wheels) (step-pivot transfer)   Toileting- Clothing Manipulation and Hygiene: Contact guard assist;Sitting/lateral lean;Sit to/from stand;Cueing for compensatory techniques       Functional mobility during ADLs: Contact guard assist;Rolling walker (2 wheels) General ADL Comments: Functional level with ADls and funcitonal mobility currently limited by L hip pain and decreased activity tolerance.     Vision Baseline Vision/History: 1 Wears glasses Ability to See in Adequate Light: 0 Adequate (with glasses) Patient Visual Report: No change from baseline       Perception         Praxis         Pertinent Vitals/Pain Pain Assessment Pain Assessment: Faces Faces Pain Scale: Hurts whole lot (L hip - 4 supine in bed, 8 in standing; B shoulders with ROM - 2) Pain Location: Left hip Pain Descriptors / Indicators: Discomfort, Grimacing, Guarding, Aching, Sore Pain Intervention(s): Limited activity within patient's tolerance, Monitored during session, Repositioned, Premedicated before session     Extremity/Trunk Assessment Upper Extremity Assessment Upper Extremity Assessment: Generalized weakness;Right hand dominant;RUE deficits/detail;LUE deficits/detail RUE Deficits / Details: generalized weakness; mild pain/soreness in biceps and upper back with AROM following recent fall with pt pulling herself back up using Rollator LUE Deficits / Details: generalized weakness; mild pain/soreness in biceps and upper back with AROM following recent fall with pt pulling herself back up using Rollator   Lower Extremity Assessment Lower Extremity  Assessment: Defer to PT evaluation       Communication Communication Communication: No apparent difficulties   Cognition Arousal: Alert (Pt with fatigue and lethargy by end of session) Behavior During Therapy: [facility] for tasks assessed/performed Overall Cognitive Status: Within Functional Limits for tasks assessed                                 General Comments: AAOx4 and pleasant throughout. Pt demonstrates cognition WNL.     General Comments  VSS on RA throughout session. Pt's daughter present throughout session. OT educated pt and daughter in recommendation of using BSC when family not present, performing grooming at kitchen sink, and showering only with family assistance at this time secondary to falls all being related to pt unable to get Rollator into bathroom with pt and daughter reporting understanding and agreement. OT also educated pt and daughter in availablity of AE/DME to assist with LB ADLs and transfers in/out bed at home with both verbalizing understanding.    Exercises     Shoulder Instructions      Home Living Family/patient expects to be discharged to:: Private residence Living Arrangements: Alone Available Help at Discharge: Family;Available PRN/intermittently (Daughter reports family can provide assistance for showers) Type of Home: Other(Comment) (townhouse) Home Access: Stairs to enter [employer] of Steps: 3 Entrance Stairs-Rails: Right;Left Home Layout: One level     Bathroom Shower/Tub: Producer, television/film/video: Standard Bathroom Accessibility: No   Home Equipment: Shower seat - built in;Rollator (4 wheels);Grab bars - tub/shower;Hand held shower head;Wheelchair - power;Other (comment) (Huricane)   Additional Comments: Plans to move to ALF when room becomes  available.      Prior Functioning/Environment Prior Level of Function : Independent/Modified Independent;History of Falls (last six months)              Mobility Comments: Pt uses a Rollator for funcitonal mobility, but uses cane going in/out of bathrooms as door frames are too narrow for the Rollator. Pt with multiple falls with pt reporting all falls occures when using can going in/out of bathroom. Family recently purchased small power wheelchair for longer distances and use in the community. ADLs Comments: Pt Independent to Mod I with ADLs with pt reporting L hip pain often makes LB dressing/bathing difficult.        OT Problem List: Decreased strength;Decreased activity tolerance;Impaired balance (sitting and/or standing);Decreased knowledge of use of DME or AE;Pain      OT Treatment/Interventions: Self-care/ADL training;Therapeutic exercise;Energy conservation;DME and/or AE instruction;Therapeutic activities;Patient/family education;Balance training    OT Goals(Current goals can be found in the care plan section) Acute Rehab OT Goals Patient Stated Goal: To be as independent as possible and to have less pain OT Goal Formulation: With patient/family Time For Goal Achievement: 04/15/23 Potential to Achieve Goals: Good ADL Goals Pt Will Perform Lower Body Bathing: with supervision;with adaptive equipment;sitting/lateral leans;sit to/from stand Pt Will Perform Lower Body Dressing: with supervision;with adaptive equipment;sitting/lateral leans;sit to/from stand Pt Will Transfer to Toilet: with modified independence;ambulating;bedside commode (with least restrictive AD) Pt Will Perform Toileting - Clothing Manipulation and hygiene: with modified independence;sit to/from stand;sitting/lateral leans Pt/caregiver will Perform Home Exercise Program: Increased strength;Both right and left upper extremity;With theraband;With theraputty;Independently;With written HEP provided (Increased activity tolerance)  OT Frequency: Min 1X/week    Co-evaluation              AM-PAC OT "6 Clicks" Daily Activity     Outcome Measure Help from another  person eating meals?: None Help from another person taking care of personal grooming?: None (in sitting) Help from another person toileting, which includes using toliet, bedpan, or urinal?: A Little Help from another person bathing (including washing, rinsing, drying)?: A Little Help from another person to put on and taking off regular upper body clothing?: A Little Help from another person to put on and taking off regular lower body clothing?: A Little 6 Click Score: 20   End of Session Equipment Utilized During Treatment: Rolling walker (2 wheels);Gait belt Nurse Communication: Mobility status  Activity Tolerance: Patient tolerated treatment well;Patient limited by pain Patient left: in bed;with call bell/phone within reach;with bed alarm set;with family/visitor present  OT Visit Diagnosis: Unsteadiness on feet (R26.81);Other abnormalities of gait and mobility (R26.89);Repeated falls (R29.6);Muscle weakness (generalized) (M62.81);History of falling (Z91.81);Other (comment) (Decreased activity tolerance)                Time: 8119-1478 OT Time Calculation (min): 47 min Charges:  OT General Charges $OT Visit: 1 Visit OT Evaluation $OT Eval Low Complexity: 1 Low OT Treatments $Self Care/Home Management : 23-37 mins  Jasmyne Lodato "Orson Eva., OTR/L, MA Acute Rehab [phone]   Lendon Colonel 04/01/2023, 11:04 AM

## 2023-04-01 NOTE — Progress Notes (Signed)
PROGRESS NOTE    [name]  HYQ:[id number] DOB: 07/12/1935 DOA: 03/31/2023 PCP: Garlan Fillers, MD    Brief Narrative:  88 year old with history of hypertension, hyperlipidemia, type 2 diabetes, coronary artery disease, lung cancer with bony mets status postchemotherapy and currently on immunotherapy, anxiety, insomnia, cervical spine disease status post surgical interventions, chronic left testicular fracture who presents from home with fall.  Lives at home, walks with a walker apparently does not fit through the door frame show she uses the cane to walk through the door frame.  While using the cane her legs got weak and fell.  She called her daughter who called EMS and transfer her to the emergency room.  Bony metastatic disease and bone pain is a major issue.  She has chronic left acetabular pathological fracture.  In the emergency room slightly elevated creatinine from baseline.  CK 1000.  Skeletal survey including CT head, CT C-spine no abnormality.  CT scan of the pelvis showed progression of the displaced chronic left history of rib fractures status post ORIF.  Received pain medications fluid and admitted to the hospital.   Assessment & Plan:   Mechanical fall Acute kidney injury Mild rhabdomyolysis  Continue maintenance IV fluids.  Intake output monitoring.  Creatinine improving.  Urine output is adequate. CK levels improving. Work with PT OT.  Will focus on pain management as below.  Lung cancer with bony mets, metastatic pain and chronic acetabular fracture. Known lung cancer with mets. Currently on immunotherapy followed by Dr. Truett Perna.  Pain management with Norco and fentanyl, will provide with injectable pain medication to facilitate working with PT OT. Significant pain due to chronic acetabular fracture.  Family wanted second opinion, admitting provider has discussed case with Dr. Carola Frost and recommended outpatient follow-up. Will consult palliative care team to  help with symptom management.  Chronic medical issues including Essential hypertension: Hold lisinopril hydrochlorothiazide in the setting of AKI. Hyperlipidemia: On statin. Coronary artery disease: Status post stent.  On aspirin, Crestor, metoprolol and Imdur. Anxiety and insomnia: On Xanax and trazodone. Restless leg syndrome: On ropinirole. Type 2 diabetes: Hold metformin from home due to AKI.  Keep on sliding scale insulin.  Goal of care: Full code.  Wants to continue treatment including chemotherapy.  She is on Keytruda now. Patient is planning to move to assisted living facility at friend's home when they have a spot available which is appropriate for her.  Work with PT OT.   DVT prophylaxis: enoxaparin (LOVENOX) injection 30 mg Start: 03/31/23 1800   Code Status: Full code Family Communication: Daughter at the bedside Disposition Plan: Status is: Observation The patient will require care spanning > 2 midnights and should be moved to inpatient because: Significant renal function abnormality, on IV fluids, pain issues.     Consultants:  Palliative care, called  Procedures:  None  Antimicrobials:  None   Subjective: Patient seen and examined in the morning rounds.  Patient denies any complaints at rest.  She does not have any pain in complete resting.  She has significant pain while attempting to walk.  Trying to work with occupational therapy.  Denies any nausea vomiting or abdominal pain.  Objective: Vitals:   03/31/23 2123 03/31/23 2142 04/01/23 0443 04/01/23 0929  BP: (!) 111/55  (!) 135/50 (!) 135/57  Pulse: 63  66 71  Resp:   18   Temp:  97.7 F (36.5 C) 97.7 F (36.5 C) 98.1 F (36.7 C)  TempSrc:  Oral  SpO2:   94% 95%    Intake/Output Summary (Last 24 hours) at 04/01/2023 1146 Last data filed at 04/01/2023 1025 Gross per 24 hour  Intake 3242.89 ml  Output 400 ml  Net 2842.89 ml   There were no vitals filed for this visit.  Examination:  General  exam: Appears calm and comfortable at rest.  Slightly anxious. Respiratory system: No added sounds. Cardiovascular system: S1 & S2 heard, RRR. Gastrointestinal system: Soft nontender. Central nervous system: Alert and oriented. No focal neurological deficits. Port-A-Cath present right chest wall.    Data Reviewed: I have personally reviewed following labs and imaging studies  CBC: Recent Labs  Lab 03/31/23 1145 04/01/23 0440  WBC 5.2 4.2  NEUTROABS 4.2  --   HGB 9.2* 9.3*  HCT 29.8* 29.7*  MCV 92.3 92.8  PLT 186 147*   Basic Metabolic Panel: Recent Labs  Lab 03/31/23 1145 04/01/23 0440  NA 139 139  K 3.9 3.8  CL 102 109  CO2 23 22  GLUCOSE 127* 96  BUN 43* 33*  CREATININE 2.29* 1.78*  CALCIUM 9.9 9.3   GFR: CrCl cannot be calculated (Unknown ideal weight.). Liver Function Tests: Recent Labs  Lab 04/01/23 0440  AST 32  ALT 24  ALKPHOS 72  BILITOT 0.5  PROT 5.5*  ALBUMIN 2.7*   No results for input(s): "LIPASE", "AMYLASE" in the last 168 hours. No results for input(s): "AMMONIA" in the last 168 hours. Coagulation Profile: No results for input(s): "INR", "PROTIME" in the last 168 hours. Cardiac Enzymes: Recent Labs  Lab 03/31/23 1145 04/01/23 0440  CKTOTAL 1,001* 700*   BNP (last 3 results) No results for input(s): "PROBNP" in the last 8760 hours. HbA1C: No results for input(s): "HGBA1C" in the last 72 hours. CBG: Recent Labs  Lab 03/31/23 1610 03/31/23 2119 04/01/23 0828 04/01/23 1127  GLUCAP 115* 110* 92 114*   Lipid Profile: No results for input(s): "CHOL", "HDL", "LDLCALC", "TRIG", "CHOLHDL", "LDLDIRECT" in the last 72 hours. Thyroid Function Tests: No results for input(s): "TSH", "T4TOTAL", "FREET4", "T3FREE", "THYROIDAB" in the last 72 hours. Anemia Panel: No results for input(s): "VITAMINB12", "FOLATE", "FERRITIN", "TIBC", "IRON", "RETICCTPCT" in the last 72 hours. Sepsis Labs: No results for input(s): "PROCALCITON", "LATICACIDVEN"  in the last 168 hours.  No results found for this or any previous visit (from the past 240 hour(s)).       Radiology Studies: CT PELVIS WO CONTRAST  Result Date: 03/31/2023 CLINICAL DATA:  Pelvic fracture. EXAM: CT PELVIS WITHOUT CONTRAST TECHNIQUE: Multidetector CT imaging of the pelvis was performed following the standard protocol without intravenous contrast. RADIATION DOSE REDUCTION: This exam was performed according to the departmental dose-optimization program which includes automated exposure control, adjustment of the mA and/or kV according to patient size and/or use of iterative reconstruction technique. COMPARISON:  CT pelvis and left hip 11/02/2022, PET-CT 12/29/2022 and left hip radiographs 01/20/2023. FINDINGS: Urinary Tract: The visualized distal ureters and bladder appear unremarkable. Bowel: No bowel wall thickening, distention or surrounding inflammation identified within the pelvis. Mild distal colonic diverticulosis. Vascular/Lymphatic: No enlarged pelvic lymph nodes identified. Aortoiliac atherosclerosis without evidence of aneurysm. Reproductive: Hysterectomy.  No adnexal mass. Other: No evidence of pelvic ascites or pneumoperitoneum. Musculoskeletal: Unchanged position of the hardware status post left iliac and superior pubic ramus screw fixation. There is no hardware loosening. The inferior left iliac screw traverses the left sacroiliac joint, as before. The left superior pubic ramus screw traverses the superior acetabulum, as before. There is a new small  amount of soft tissue thickening around the head of this screw. Previously demonstrated comminuted left acetabular fracture demonstrates mildly progressive displacement, with up to 7 mm of displacement along the pelvic sidewall on image 69/4. No interval fracture healing identified. No new fractures are identified. There is no evidence of proximal femur fracture. Probable small left hip joint effusion with synovial thickening. No  new lytic or blastic lesions are identified. There are stable degenerative changes within the sacroiliac joints and lower lumbar spine status post lower lumbar interspinous fusion. No significant pelvic or proximal thigh soft tissue hematoma or focal muscular atrophy. IMPRESSION: 1. Mildly progressive displacement of the comminuted left acetabular fracture status post ORIF. No interval fracture healing identified. 2. No new fractures are identified. 3. No evidence of hardware loosening. 4.  Aortic Atherosclerosis (ICD10-I70.0). Electronically Signed   By: Carey Bullocks M.D.   On: 03/31/2023 13:00   CT Head Wo Contrast  Result Date: 03/31/2023 CLINICAL DATA:  Ataxia, head trauma; Neck trauma, dangerous injury mechanism (Age 62-64y). EXAM: CT HEAD WITHOUT CONTRAST CT CERVICAL SPINE WITHOUT CONTRAST TECHNIQUE: Multidetector CT imaging of the head and cervical spine was performed following the standard protocol without intravenous contrast. Multiplanar CT image reconstructions of the cervical spine were also generated. RADIATION DOSE REDUCTION: This exam was performed according to the departmental dose-optimization program which includes automated exposure control, adjustment of the mA and/or kV according to patient size and/or use of iterative reconstruction technique. COMPARISON:  PET CT May 31 24. FINDINGS: CT HEAD FINDINGS Brain: No evidence of acute infarction, hemorrhage, hydrocephalus, extra-axial collection or mass lesion/mass effect. Vascular: No hyperdense vessel.  Calcific atherosclerosis. Skull: No acute fracture. Sinuses/Orbits: Clear sinuses.  No acute orbital finding. Other: No mastoid effusions CT CERVICAL SPINE FINDINGS Alignment: No substantial sagittal subluxation. Skull base and vertebrae: Solid C5-C6 ACDF. No acute fracture. No definite suspicious bone lesion. Soft tissues and spinal canal: No prevertebral fluid or swelling. No visible canal hematoma. Disc levels: Multilevel degenerative  change, better characterized on prior MRI. Upper chest: Visualized lung apices are clear. IMPRESSION: No evidence of acute abnormality intracranially or in the cervical spine. Electronically Signed   By: Feliberto Harts M.D.   On: 03/31/2023 12:49   CT Cervical Spine Wo Contrast  Result Date: 03/31/2023 CLINICAL DATA:  Ataxia, head trauma; Neck trauma, dangerous injury mechanism (Age 65-64y). EXAM: CT HEAD WITHOUT CONTRAST CT CERVICAL SPINE WITHOUT CONTRAST TECHNIQUE: Multidetector CT imaging of the head and cervical spine was performed following the standard protocol without intravenous contrast. Multiplanar CT image reconstructions of the cervical spine were also generated. RADIATION DOSE REDUCTION: This exam was performed according to the departmental dose-optimization program which includes automated exposure control, adjustment of the mA and/or kV according to patient size and/or use of iterative reconstruction technique. COMPARISON:  PET CT May 31 24. FINDINGS: CT HEAD FINDINGS Brain: No evidence of acute infarction, hemorrhage, hydrocephalus, extra-axial collection or mass lesion/mass effect. Vascular: No hyperdense vessel.  Calcific atherosclerosis. Skull: No acute fracture. Sinuses/Orbits: Clear sinuses.  No acute orbital finding. Other: No mastoid effusions CT CERVICAL SPINE FINDINGS Alignment: No substantial sagittal subluxation. Skull base and vertebrae: Solid C5-C6 ACDF. No acute fracture. No definite suspicious bone lesion. Soft tissues and spinal canal: No prevertebral fluid or swelling. No visible canal hematoma. Disc levels: Multilevel degenerative change, better characterized on prior MRI. Upper chest: Visualized lung apices are clear. IMPRESSION: No evidence of acute abnormality intracranially or in the cervical spine. Electronically Signed   By: Thornton Dales  Yetta Barre M.D.   On: 03/31/2023 12:49        Scheduled Meds:  aspirin EC  81 mg Oral Daily   enoxaparin (LOVENOX) injection  30 mg  Subcutaneous Q24H   [START ON 04/02/2023] fentaNYL  1 patch Transdermal Q72H   gabapentin  300 mg Oral QHS   insulin aspart  0-9 Units Subcutaneous TID WC   isosorbide mononitrate  30 mg Oral Daily   metoprolol tartrate  12.5 mg Oral BID   rOPINIRole  1 mg Oral TID   rosuvastatin  40 mg Oral QHS   sodium chloride flush  3 mL Intravenous Q12H   traZODone  150 mg Oral QHS   Continuous Infusions:  sodium chloride       LOS: 0 days    Time spent: 35 minutes    Dorcas Carrow, MD [facility]

## 2023-04-02 DIAGNOSIS — N179 Acute kidney failure, unspecified: Secondary | ICD-10-CM | POA: Diagnosis not present

## 2023-04-02 DIAGNOSIS — C7951 Secondary malignant neoplasm of bone: Secondary | ICD-10-CM | POA: Diagnosis not present

## 2023-04-02 DIAGNOSIS — G8929 Other chronic pain: Secondary | ICD-10-CM

## 2023-04-02 DIAGNOSIS — R531 Weakness: Secondary | ICD-10-CM

## 2023-04-02 DIAGNOSIS — M25552 Pain in left hip: Secondary | ICD-10-CM

## 2023-04-02 DIAGNOSIS — C3492 Malignant neoplasm of unspecified part of left bronchus or lung: Secondary | ICD-10-CM | POA: Diagnosis not present

## 2023-04-02 DIAGNOSIS — Z515 Encounter for palliative care: Secondary | ICD-10-CM

## 2023-04-02 LAB — GLUCOSE, CAPILLARY
Glucose-Capillary: 96 mg/dL (ref 70–99)
Glucose-Capillary: 121 mg/dL — ABNORMAL HIGH (ref 70–99)
Glucose-Capillary: 129 mg/dL — ABNORMAL HIGH (ref 70–99)
Glucose-Capillary: 130 mg/dL — ABNORMAL HIGH (ref 70–99)

## 2023-04-02 LAB — BASIC METABOLIC PANEL
Anion gap: 7 (ref 5–15)
BUN: 26 mg/dL — ABNORMAL HIGH (ref 8–23)
CO2: 22 mmol/L (ref 22–32)
Calcium: 9.2 mg/dL (ref 8.9–10.3)
Chloride: 107 mmol/L (ref 98–111)
Creatinine, Ser: 1.4 mg/dL — ABNORMAL HIGH (ref 0.44–1.00)
GFR, Estimated: 37 mL/min — ABNORMAL LOW (ref >60)
Glucose, Bld: 137 mg/dL — ABNORMAL HIGH (ref 70–99)
Potassium: 3.4 mmol/L — ABNORMAL LOW (ref 3.5–5.1)
Sodium: 136 mmol/L (ref 135–145)

## 2023-04-02 LAB — MAGNESIUM: Magnesium: 2 mg/dL (ref 1.7–2.4)

## 2023-04-02 LAB — PHOSPHORUS: Phosphorus: 2.8 mg/dL (ref 2.5–4.6)

## 2023-04-02 MED ORDER — POTASSIUM CHLORIDE CRYS ER 20 MEQ PO TBCR
40.0000 meq | EXTENDED_RELEASE_TABLET | Freq: Once | ORAL | Status: AC
  Administered 2023-04-02: 40 meq via ORAL
  Filled 2023-04-02: qty 2

## 2023-04-02 MED ORDER — ACETAMINOPHEN 500 MG PO TABS
1000.0000 mg | ORAL_TABLET | Freq: Three times a day (TID) | ORAL | Status: DC
  Administered 2023-04-02 – 2023-04-07 (×16): 1000 mg via ORAL
  Filled 2023-04-02 (×16): qty 2

## 2023-04-02 MED ORDER — OXYCODONE HCL 5 MG PO TABS
10.0000 mg | ORAL_TABLET | ORAL | Status: DC | PRN
  Administered 2023-04-02: 10 mg via ORAL
  Administered 2023-04-03 (×2): 5 mg via ORAL
  Administered 2023-04-03 – 2023-04-07 (×18): 10 mg via ORAL
  Filled 2023-04-02 (×21): qty 2

## 2023-04-02 NOTE — Progress Notes (Signed)
Occupational Therapy Treatment Patient Details Name: [name] MRN: [id number] DOB: 02-11-1935 Today's Date: 04/02/2023   History of present illness Pt is an 88 y.o. female who presented to the ED 8/31 with EMS transport from home after two falls on the same day with pt requiring assistance to get up from second fall. Pt with chronic Left acetabular fracture with hx of ORIF. PMH: hypertension, hyperlipidemia, diabetes, CAD status post stent, lung cancer with bone mets status post chemo and continued on immunotherapy, anxiety, insomnia, RLS, cervical spine disease status post surgical intervention   OT comments  Pt completing functional mobility with CGA, continues to have L hip pain and tightness that seems to help following mobilization. Educated pt and family on the use of AE to help with rear pericare, handout provided. Based on pt risk for falls and need for assist the pt would need 24/7 supervision upon DC. Discussed with pt and family SNF and HH options with the use of PCAs, OT conveying the benefits of SNF at this time as pt would benefit from more strengthening before DC home. DC plans continue to remain for SNF, CSW going to discussed DC plans with family. If pt were to DC home then rec [facility] therapy services to continue to progress with strengthening and balance.       If plan is discharge home, recommend the following:  A little help with walking and/or transfers;A little help with bathing/dressing/bathroom;Assistance with cooking/housework;Assist for transportation;Help with stairs or ramp for entrance   Equipment Recommendations  BSC/3in1    Recommendations for Other Services      Precautions / Restrictions Precautions Precautions: Fall Restrictions Weight Bearing Restrictions: No Other Position/Activity Restrictions: Ortho Traumatologist consult pending       Mobility Bed Mobility               General bed mobility comments: Pt up in chair upon OT  entry, left sitting in recliner at end of session    Transfers Overall transfer level: Needs assistance Equipment used: Rolling walker (2 wheels) Transfers: Sit to/from Stand Sit to Stand: Contact guard assist                 Balance Overall balance assessment: Needs assistance, History of Falls Sitting-balance support: No upper extremity supported, Feet supported Sitting balance-Leahy Scale: Good Sitting balance - Comments: up in chair   Standing balance support: Bilateral upper extremity supported, During functional activity, Reliant on assistive device for balance Standing balance-Leahy Scale: Poor                             ADL either performed or assessed with clinical judgement   ADL Overall ADL's : Needs assistance/impaired                                     Functional mobility during ADLs: Contact guard assist;Rolling walker (2 wheels) General ADL Comments: Educated pt and family on the use of AE to help with wiping for toileting, handout provided. Pt folding laundry standing at coutner with CGA to simulate iADL of laundry. Discussed with pt/family the benefits of SNF vs having 2 PCAs and receiving HH therapy.    Extremity/Trunk Assessment              Vision       Perception     Praxis  Cognition Arousal: Alert Behavior During Therapy: WFL for tasks assessed/performed Overall Cognitive Status: Within Functional Limits for tasks assessed                                          Exercises      Shoulder Instructions       General Comments VSS on RA    Pertinent Vitals/ Pain       Pain Assessment Pain Assessment: Faces Faces Pain Scale: Hurts little more Pain Location: L hip Pain Descriptors / Indicators: Discomfort, Grimacing, Guarding, Aching, Sore Pain Intervention(s): Monitored during session, Limited activity within patient's tolerance, Repositioned  Home Living                                           Prior Functioning/Environment              Frequency  Min 1X/week        Progress Toward Goals  OT Goals(current goals can now be found in the care plan section)  Progress towards OT goals: Progressing toward goals  Acute Rehab OT Goals Patient Stated Goal: To be as independent as possible and to have less pain OT Goal Formulation: With patient/family Time For Goal Achievement: 04/15/23 Potential to Achieve Goals: Good  Plan      Co-evaluation                 AM-PAC OT "6 Clicks" Daily Activity     Outcome Measure   Help from another person eating meals?: None Help from another person taking care of personal grooming?: None (in sitting) Help from another person toileting, which includes using toliet, bedpan, or urinal?: A Little Help from another person bathing (including washing, rinsing, drying)?: A Little Help from another person to put on and taking off regular upper body clothing?: A Little Help from another person to put on and taking off regular lower body clothing?: A Little 6 Click Score: 20    End of Session Equipment Utilized During Treatment: Rolling walker (2 wheels);Gait belt  OT Visit Diagnosis: Unsteadiness on feet (R26.81);Other abnormalities of gait and mobility (R26.89);Repeated falls (R29.6);Muscle weakness (generalized) (M62.81);History of falling (Z91.81);Other (comment)   Activity Tolerance Patient tolerated treatment well;Patient limited by pain   Patient Left with call bell/phone within reach;with family/visitor present;in chair   Nurse Communication Mobility status        Time: 2952-8413 OT Time Calculation (min): 32 min  Charges: OT General Charges $OT Visit: 1 Visit OT Treatments $Therapeutic Activity: 23-37 mins  04/02/2023  AB, OTR/L  Acute Rehabilitation Services  Office: [phone]   Tristan Schroeder 04/02/2023, 2:58 PM

## 2023-04-02 NOTE — TOC Progression Note (Signed)
Transition of Care [facility]) - Progression Note    Patient Details  Name: [name] MRN: [id number] Date of Birth: Jun 01, 1935  [facility]) CM/SW Contact  Ronny Bacon, RN Phone Number: 04/02/2023, 6:58 AM  Clinical Narrative:   Angie- [facility] able to accept patient for [facility] PT/OT services. Will need HH orders placed.    Expected Discharge Plan: Home w Home Health Services Barriers to Discharge: Continued Medical Work up  Expected Discharge Plan and Services       Living arrangements for the past 2 months: Single Family Home                 DME Arranged: 3-N-1 DME Agency: [employer] Date DME Agency Contacted: 04/01/23 Time DME Agency Contacted: 1601 Representative spoke with at DME Agency: Vaughan Basta HH Arranged: PT, OT HH Agency: [facility] Date [facility] Agency Contacted: 04/01/23 Time HH Agency Contacted: 1630 Representative spoke with at [facility] Agency: Angie   Social Determinants of Health (SDOH) Interventions SDOH Screenings   Food Insecurity: No Food Insecurity (03/18/2021)  Housing: Low Risk  (03/18/2021)  Transportation Needs: No Transportation Needs (03/18/2021)  Tobacco Use: Medium Risk (03/14/2023)    Readmission Risk Interventions     No data to display

## 2023-04-02 NOTE — Care Management (Signed)
    Durable Medical Equipment  (From admission, onward)           Start     Ordered   04/02/23 0701  For home use only DME 3 n 1  Once        04/02/23 0700

## 2023-04-02 NOTE — Progress Notes (Signed)
Patient ID: [name], female   DOB: 1934/09/20, 88 y.o.   MRN: [id number]    Progress Note from the Palliative Medicine Team at [facility]   Patient Name: [name]        Date: 04/02/2023 DOB: 11/21/1934  Age: 88 y.o. MRN#: [id number] Attending Physician: Dorcas Carrow, MD Primary Care Physician: Garlan Fillers, MD Admit Date: 03/31/2023   Reason for Consultation/Follow-up   Establishing goals of care   HPI/ Brief Hospital Review  [name] is a 88 y.o. female with medical history significant of hypertension, hyperlipidemia, diabetes, CAD status post stent, lung cancer with bone mets status post chemo and continued on immunotherapy, anxiety, insomnia, RLS, cervical spine disease status post surgical intervention, chronic left acetabular fracture presenting with falls.  Continued physical and functional decline.  History of falls at home.  Patient transported via EMS to ED for treatment and evaluation   Patient does currently live alone with daughter living nearby.  Her husband passed away few months ago.  Patient continues on immunotherapy for lung cancer which is stable.  She does have bone metastases which affects her mobility along with a subsequent chronic left acetabular pathologic fracture.   Patient and her family face treatment option decisions, advanced directive decisions and anticipatory care needs.   Subjective  Extensive chart review has been completed prior to meeting with patient/family  including labs, vital signs, imaging, progress/consult notes, orders, medications and available advance directive documents.    This NP assessed patient at the bedside as a follow up for palliative medicine needs and emotional support. Patient's daughter Yaakov Guthrie at bedside.  Continued education regarding current medical situation.  Patient continues with oncologic care with Dr. Truett Perna at [facility].  She recognizes her  increasing care needs secondary to continued physical and functional decline.  She is currently looking at options as far as assisted living facilities.    Patient is hopeful to maintain her independence as long as possible.  She and her daughter are open to skilled nursing facility for short-term rehab in hopes of increasing strength and mobility.    Pain in her left hip continues to hinder her mobility.  Pain management   -- add Tylenol 1000 mg every 8 hours - K-pad  -Continue previously ordered fentanyl patch 12 milligram as directed and Oxycodone  10 mg every 4 hrs prn   ( Re-evaluate in the am for efficacy)   Education offered today regarding  the importance of continued conversation with her family and the medical providers regarding overall plan of care and treatment options,  ensuring decisions are within the context of the patients values and GOCs.  PMT will continue to support holistically  Questions and concerns addressed   Discussed with primary team and nursing staff   Time:  70 minutes, Discussed with Dr Jerral Ralph   Detailed review of medical records ( labs, imaging, vital signs), medically appropriate exam ( MS, skin, cardia,  resp)   discussed with treatment team, counseling and education to patient, family, staff, documenting clinical information, medication management, coordination of care    Lorinda Creed NP  Palliative Medicine Team Team Phone # [phone] Pager [phone]

## 2023-04-02 NOTE — Plan of Care (Signed)
  Problem: Coping: Goal: Ability to adjust to condition or change in health will improve Outcome: Not Progressing   Problem: Health Behavior/Discharge Planning: Goal: Ability to manage health-related needs will improve Outcome: Not Progressing   Problem: Skin Integrity: Goal: Risk for impaired skin integrity will decrease Outcome: Not Progressing   Problem: Activity: Goal: Risk for activity intolerance will decrease Outcome: Not Progressing   Problem: Safety: Goal: Ability to remain free from injury will improve Outcome: Not Progressing

## 2023-04-02 NOTE — Progress Notes (Signed)
PROGRESS NOTE    [name]  QIH:[id number] DOB: 02-23-1935 DOA: 03/31/2023 PCP: Garlan Fillers, MD    Brief Narrative:  88 year old with history of hypertension, hyperlipidemia, type 2 diabetes, coronary artery disease, lung cancer with bony mets status postchemotherapy and currently on immunotherapy, anxiety, insomnia, cervical spine disease status post surgical interventions, chronic left testicular fracture who presents from home with fall.  Lives at home, walks with a walker apparently does not fit through the door frame show she uses the cane to walk through the door frame.  While using the cane her legs got weak and fell.  She called her daughter who called EMS and transfer her to the emergency room.  Bony metastatic disease and bone pain is a major issue.  She has chronic left acetabular pathological fracture.  In the emergency room slightly elevated creatinine from baseline.  CK 1000.  Skeletal survey including CT head, CT C-spine no abnormality.  CT scan of the pelvis showed progression of the displaced chronic left history of rib fractures status post ORIF.  Received pain medications fluid and admitted to the hospital.   Assessment & Plan:   Mechanical fall Acute kidney injury Mild rhabdomyolysis  Some improvement of renal functions and CK levels.  Will discontinue IV fluids today and monitor levels.  Encourage oral intake.  Intake output monitoring. Work with PT OT.  Will focus on pain management as below.  Lung cancer with bony mets, metastatic pain and chronic acetabular fracture. Known lung cancer with mets. Currently on immunotherapy followed by Dr. Truett Perna.  Pain management with Norco and fentanyl, will provide with injectable pain medication to facilitate working with PT OT. Significant pain due to chronic acetabular fracture.  Family wanted second opinion, admitting provider has discussed case with Dr. Carola Frost and recommended outpatient follow-up. Appreciate  palliative care input, pain management.  Chronic medical issues including Essential hypertension: Hold lisinopril hydrochlorothiazide in the setting of AKI. Hyperlipidemia: On statin. Coronary artery disease: Status post stent.  On aspirin, Crestor, metoprolol and Imdur. Anxiety and insomnia: On Xanax and trazodone. Restless leg syndrome: On ropinirole. Type 2 diabetes: Hold metformin from home due to AKI.  Keep on sliding scale insulin.  Goal of care: Seen by palliative care.  DNR with intervention. Wants to continue treatment including chemotherapy.  She is on Keytruda now. Patient is planning to move to assisted living facility [facility] when they have a spot available which is appropriate for her.  She will benefit with short-term rehab.  Referred to a SNF.   DVT prophylaxis: enoxaparin (LOVENOX) injection 30 mg Start: 03/31/23 1800   Code Status: Full code Family Communication: Daughter at the bedside Disposition Plan: Status is: Inpatient.  Significant renal function abnormalities.     Consultants:  Palliative care,   Procedures:  None  Antimicrobials:  None   Subjective:  Patient seen and examined.  Difficulty mobilizing otherwise denies any other complaints.  Persistent pain right hip.  Scared to go home.  Objective: Vitals:   04/01/23 0929 04/01/23 1639 04/01/23 2041 04/02/23 0741  BP: (!) 135/57 123/61 (!) 97/52 137/64  Pulse: 71 (!) 59 (!) 56 65  Resp:   18 16  Temp: 98.1 F (36.7 C) 98.1 F (36.7 C) 98 F (36.7 C) 97.8 F (36.6 C)  TempSrc:   Oral   SpO2: 95% 94% 96% 97%    Intake/Output Summary (Last 24 hours) at 04/02/2023 1316 Last data filed at 04/02/2023 1219 Gross per 24 hour  Intake 2089.59 ml  Output 0 ml  Net 2089.59 ml   There were no vitals filed for this visit.  Examination:  General exam: Appears calm and comfortable at rest.  Difficulty with mobility. Respiratory system: No added sounds. Cardiovascular system: S1 & S2  heard, RRR. Gastrointestinal system: Soft nontender. Central nervous system: Alert and oriented. No focal neurological deficits. Port-A-Cath present right chest wall.    Data Reviewed: I have personally reviewed following labs and imaging studies  CBC: Recent Labs  Lab 03/31/23 1145 04/01/23 0440  WBC 5.2 4.2  NEUTROABS 4.2  --   HGB 9.2* 9.3*  HCT 29.8* 29.7*  MCV 92.3 92.8  PLT 186 147*   Basic Metabolic Panel: Recent Labs  Lab 03/31/23 1145 04/01/23 0440 04/02/23 0451  NA 139 139 136  K 3.9 3.8 3.4*  CL 102 109 107  CO2 23 22 22   GLUCOSE 127* 96 137*  BUN 43* 33* 26*  CREATININE 2.29* 1.78* 1.40*  CALCIUM 9.9 9.3 9.2  MG  --   --  2.0  PHOS  --   --  2.8   GFR: CrCl cannot be calculated (Unknown ideal weight.). Liver Function Tests: Recent Labs  Lab 04/01/23 0440  AST 32  ALT 24  ALKPHOS 72  BILITOT 0.5  PROT 5.5*  ALBUMIN 2.7*   No results for input(s): "LIPASE", "AMYLASE" in the last 168 hours. No results for input(s): "AMMONIA" in the last 168 hours. Coagulation Profile: No results for input(s): "INR", "PROTIME" in the last 168 hours. Cardiac Enzymes: Recent Labs  Lab 03/31/23 1145 04/01/23 0440  CKTOTAL 1,001* 700*   BNP (last 3 results) No results for input(s): "PROBNP" in the last 8760 hours. HbA1C: No results for input(s): "HGBA1C" in the last 72 hours. CBG: Recent Labs  Lab 04/01/23 1127 04/01/23 1645 04/01/23 2043 04/02/23 0739 04/02/23 1118  GLUCAP 114* 143* 133* 96 121*   Lipid Profile: No results for input(s): "CHOL", "HDL", "LDLCALC", "TRIG", "CHOLHDL", "LDLDIRECT" in the last 72 hours. Thyroid Function Tests: No results for input(s): "TSH", "T4TOTAL", "FREET4", "T3FREE", "THYROIDAB" in the last 72 hours. Anemia Panel: No results for input(s): "VITAMINB12", "FOLATE", "FERRITIN", "TIBC", "IRON", "RETICCTPCT" in the last 72 hours. Sepsis Labs: No results for input(s): "PROCALCITON", "LATICACIDVEN" in the last 168  hours.  No results found for this or any previous visit (from the past 240 hour(s)).       Radiology Studies: No results found.      Scheduled Meds:  acetaminophen  1,000 mg Oral Q8H   aspirin EC  81 mg Oral Daily   enoxaparin (LOVENOX) injection  30 mg Subcutaneous Q24H   fentaNYL  1 patch Transdermal Q72H   gabapentin  300 mg Oral QHS   insulin aspart  0-9 Units Subcutaneous TID WC   isosorbide mononitrate  30 mg Oral Daily   metoprolol tartrate  12.5 mg Oral BID   mupirocin ointment   Topical Daily   polyethylene glycol  17 g Oral Daily   potassium chloride  40 mEq Oral Once   rOPINIRole  1 mg Oral TID   rosuvastatin  40 mg Oral QHS   sodium chloride flush  3 mL Intravenous Q12H   traZODone  150 mg Oral QHS   Continuous Infusions:     LOS: 1 day    Time spent: 35 minutes    Dorcas Carrow, MD [facility]

## 2023-04-02 NOTE — Progress Notes (Signed)
Physical Therapy Note  Continuing to follow;  Discussed pt's case with Stevenson Clinch, NP with Palliative Medicine;  NP indicated that pt's daughter is very concerned about pt going back home alone, given history of falls, and pt's ongoing difficulty with pain in the setting of metastatic disease;  Pt and family are interested in post-acute rehab to maximize independence and safety with mobility and ADLs, decr fall risk;  PT can get behind this, as pt indicated yesterday that getting up and walking helped her feel better, and she would like to work on moving and decreasing her fall risk;  Will update PT recommendations to SNF for post-acute rehab;  Pt and family are still pursuing getting an ALF apartment for safer and more sustainable living arrangements;     04/02/23 1300   PT - Assessment/Plan  PT Visit Diagnosis Unsteadiness on feet (R26.81);History of falling (Z91.81);Pain  Pain - Right/Left Left  Pain - part of body Hip  Recommendations for Other Services Other (comment) (Palliative Medicine)  Follow Up Recommendations Skilled nursing-short term rehab (<3 hours/day)  Can patient physically be transported by private vehicle Yes   Thanks,   Van Clines, PT  Acute Rehabilitation Services Office [phone] Secure Chat welcomed

## 2023-04-03 ENCOUNTER — Other Ambulatory Visit: Payer: Medicare Other

## 2023-04-03 ENCOUNTER — Telehealth: Payer: Self-pay | Admitting: *Deleted

## 2023-04-03 ENCOUNTER — Ambulatory Visit: Payer: Medicare Other | Admitting: Oncology

## 2023-04-03 DIAGNOSIS — C3492 Malignant neoplasm of unspecified part of left bronchus or lung: Secondary | ICD-10-CM | POA: Diagnosis not present

## 2023-04-03 DIAGNOSIS — C7951 Secondary malignant neoplasm of bone: Secondary | ICD-10-CM | POA: Diagnosis not present

## 2023-04-03 DIAGNOSIS — M25552 Pain in left hip: Secondary | ICD-10-CM | POA: Diagnosis not present

## 2023-04-03 DIAGNOSIS — F419 Anxiety disorder, unspecified: Secondary | ICD-10-CM | POA: Diagnosis not present

## 2023-04-03 DIAGNOSIS — N179 Acute kidney failure, unspecified: Secondary | ICD-10-CM | POA: Diagnosis not present

## 2023-04-03 LAB — BASIC METABOLIC PANEL
Anion gap: 10 (ref 5–15)
BUN: 18 mg/dL (ref 8–23)
CO2: 22 mmol/L (ref 22–32)
Calcium: 9.9 mg/dL (ref 8.9–10.3)
Chloride: 108 mmol/L (ref 98–111)
Creatinine, Ser: 1.23 mg/dL — ABNORMAL HIGH (ref 0.44–1.00)
GFR, Estimated: 43 mL/min — ABNORMAL LOW (ref >60)
Glucose, Bld: 93 mg/dL (ref 70–99)
Potassium: 4.4 mmol/L (ref 3.5–5.1)
Sodium: 140 mmol/L (ref 135–145)

## 2023-04-03 LAB — GLUCOSE, CAPILLARY
Glucose-Capillary: 113 mg/dL — ABNORMAL HIGH (ref 70–99)
Glucose-Capillary: 183 mg/dL — ABNORMAL HIGH (ref 70–99)
Glucose-Capillary: 118 mg/dL — ABNORMAL HIGH (ref 70–99)

## 2023-04-03 MED ORDER — ALPRAZOLAM 0.25 MG PO TABS
0.2500 mg | ORAL_TABLET | Freq: Three times a day (TID) | ORAL | Status: DC
  Administered 2023-04-03 – 2023-04-07 (×12): 0.25 mg via ORAL
  Filled 2023-04-03 (×12): qty 1

## 2023-04-03 NOTE — Telephone Encounter (Signed)
Daughter, [name] called to report that patient was admitted to [facility] on 8/31 after #2 falls in the home. Plans to transfer out to rehab when a bed can be found. Hopeful to go to [facility]. When rehab finishes will return to [facility].  Dr. Truett Perna made aware and plans to see patient tomorrow. Will cancel her appointments for 9/4 at [facility].

## 2023-04-03 NOTE — Progress Notes (Signed)
Physical Therapy Treatment Patient Details Name: [name] MRN: [id number] DOB: 10/21/1934 Today's Date: 04/03/2023   History of Present Illness Pt is an 88 y.o. female who presented to the ED 8/31 with EMS transport from home after two falls on the same day with pt requiring assistance to get up from second fall. Pt with chronic Left acetabular fracture with hx of ORIF. PMH: hypertension, hyperlipidemia, diabetes, CAD status post stent, lung cancer with bone mets status post chemo and continued on immunotherapy, anxiety, insomnia, RLS, cervical spine disease status post surgical intervention    PT Comments  Pt received in recliner, agreeable to therapy session with good participation and tolerance for gait and transfer training. Pt remains limited due to LLE weakness/pain but able to slightly increase gait distance this date. Pt needing mod cues for LLE sequencing and energy conservation and encouragement to transfer back to bed, pt reports sleeping in recliner previous evening but agreeable to try sleeping in bed due to increased risk of bed sores if always in recliner. Pt continues to benefit from PT services to progress toward functional mobility goals.     If plan is discharge home, recommend the following: A little help with walking and/or transfers;A little help with bathing/dressing/bathroom;Help with stairs or ramp for entrance;Assistance with cooking/housework   Can travel by private vehicle     Yes  Equipment Recommendations  Other (comment);BSC/3in1 (TBD)    Recommendations for Other Services Other (comment) (Palliative medicine)     Precautions / Restrictions Precautions Precautions: Fall Restrictions Weight Bearing Restrictions: No Other Position/Activity Restrictions: Ortho Traumatologist consult pending     Mobility  Bed Mobility Overal bed mobility: Needs Assistance Bed Mobility: Sit to Supine       Sit to supine: Min assist, HOB elevated, Used rails    General bed mobility comments: Mod cues for technique/self-assist    Transfers Overall transfer level: Needs assistance Equipment used: Rolling walker (2 wheels) Transfers: Sit to/from Stand Sit to Stand: Contact guard assist, From elevated surface           General transfer comment: from recliner, BSC<>RW and RW>EOB, cues each transfer for safer UE placement    Ambulation/Gait Ambulation/Gait assistance: Contact guard assist, Min assist Gait Distance (Feet): 50 Feet (50+67ft with break in bathroom) Assistive device: Rolling walker (2 wheels) Gait Pattern/deviations: Step-through pattern, Decreased step length - left, Decreased dorsiflexion - left, Narrow base of support Gait velocity: grossly <0.2 m/s     General Gait Details: Cues for sequencing to use RW to unweigh painful L hip in stance and step-to pattern, along with improved heel strike/wider stance for better stability   Stairs             Wheelchair Mobility     Tilt Bed    Modified Rankin (Stroke Patients Only)       Balance Overall balance assessment: Needs assistance, History of Falls Sitting-balance support: Feet supported, Single extremity supported, Bilateral upper extremity supported Sitting balance-Leahy Scale: Fair Sitting balance - Comments: EOB wtih BUE support   Standing balance support: Bilateral upper extremity supported, During functional activity, Reliant on assistive device for balance Standing balance-Leahy Scale: Fair Standing balance comment: fair static standing to pull up pants, needs CGA for safety, poor dynamic standing unless supported                            Cognition Arousal: Alert Behavior During Therapy: WFL for tasks assessed/performed Overall  Cognitive Status: Within Functional Limits for tasks assessed                                 General Comments: Pt states she sleeps in a lift chair at baseline and finds bed very uncomfortable  "because I get stuck in the hole" and states that she slept in the recliner previous night. pt agreeable to attempt bed chair posture with many pillows for back/UE support after session.        Exercises      General Comments General comments (skin integrity, edema, etc.): BP 123/61 (79) HR 60 bpm; SpO2 96% on RA sitting in chair. Pt used bathroom but did not have BM, just urinated.      Pertinent Vitals/Pain Pain Assessment Pain Assessment: 0-10 Pain Score: 7  Pain Location: L hip with gait Pain Descriptors / Indicators: Discomfort, Grimacing, Guarding, Aching, Sore Pain Intervention(s): Monitored during session, Repositioned     PT Goals (current goals can now be found in the care plan section) Acute Rehab PT Goals Patient Stated Goal: Hopes to be able to move into [facility] PT Goal Formulation: With patient Time For Goal Achievement: 04/15/23 Progress towards PT goals: Progressing toward goals    Frequency    Min 1X/week      PT Plan         AM-PAC PT "6 Clicks" Mobility   Outcome Measure  Help needed turning from your back to your side while in a flat bed without using bedrails?: A Little Help needed moving from lying on your back to sitting on the side of a flat bed without using bedrails?: A Little Help needed moving to and from a bed to a chair (including a wheelchair)?: A Little Help needed standing up from a chair using your arms (e.g., wheelchair or bedside chair)?: A Little Help needed to walk in hospital room?: A Little Help needed climbing 3-5 steps with a railing? : Total 6 Click Score: 16    End of Session Equipment Utilized During Treatment: Gait belt Activity Tolerance: Patient tolerated treatment well Patient left: in bed;with call bell/phone within reach;with bed alarm set;with family/visitor present;Other (comment) (bed trended forward and in chair posture for pt comfort; younger female family friend visiting her) Nurse Communication:  Mobility status;Other (comment) (pt wants ice water and coffee) PT Visit Diagnosis: Unsteadiness on feet (R26.81);History of falling (Z91.81);Pain Pain - Right/Left: Left Pain - part of body: Hip     Time: 1730-1758 PT Time Calculation (min) (ACUTE ONLY): 28 min  Charges:    $Gait Training: 8-22 mins $Therapeutic Activity: 8-22 mins PT General Charges $$ ACUTE PT VISIT: 1 Visit                     Tenaya Hilyer P., PTA Acute Rehabilitation Services Secure Chat Preferred 9a-5:30pm Office: [phone]    Dorathy Kinsman [facility] 04/03/2023, 6:41 PM

## 2023-04-03 NOTE — TOC Progression Note (Signed)
Transition of Care [facility]) - Progression Note    Patient Details  Name: [name] MRN: [id number] Date of Birth: 05-Mar-1935  [facility]) CM/SW Contact  Eduard Roux, [place] Phone Number: 04/03/2023, 2:54 PM  Clinical Narrative:     CSW met with patient's daughter- informed [facility] states no bed availability. Will provide bed offer once available.   Antony Blackbird, MSW, LCSW Clinical Social Worker    Expected Discharge Plan: Skilled Nursing Facility Barriers to Discharge: English as a second language teacher, SNF Pending bed offer  Expected Discharge Plan and Services       Living arrangements for the past 2 months: Single Family Home                 DME Arranged: 3-N-1 DME Agency: [employer] Date DME Agency Contacted: 04/01/23 Time DME Agency Contacted: [phone] Representative spoke with at DME Agency: Vaughan Basta HH Arranged: PT, OT HH Agency: [facility] Date [facility] Agency Contacted: 04/01/23 Time HH Agency Contacted: 1630 Representative spoke with at [facility] Agency: Angie   Social Determinants of Health (SDOH) Interventions SDOH Screenings   Food Insecurity: No Food Insecurity (03/18/2021)  Housing: Low Risk  (03/18/2021)  Transportation Needs: No Transportation Needs (03/18/2021)  Tobacco Use: Medium Risk (03/14/2023)    Readmission Risk Interventions     No data to display

## 2023-04-03 NOTE — Progress Notes (Signed)
PROGRESS NOTE    [name]  OVF:[id number] DOB: 1934-08-30 DOA: 03/31/2023 PCP: Garlan Fillers, MD    Brief Narrative:  88 year old with history of hypertension, hyperlipidemia, type 2 diabetes, coronary artery disease, lung cancer with bony mets status postchemotherapy and currently on immunotherapy, anxiety, insomnia, cervical spine disease status post surgical interventions, chronic left acetabular fracture who presents from home with fall.  Lives at home, walks with a walker apparently does not fit through the door frame so she uses the cane to walk through the door frame.  While using the cane her legs got weak and fell.  She called her daughter who called EMS and transfer her to the emergency room.  Bony metastatic disease and bone pain is a major issue.  She has chronic left acetabular pathological fracture.  In the emergency room slightly elevated creatinine from baseline.  CK 1000.  Skeletal survey including CT head, CT C-spine no abnormality.  CT scan of the pelvis showed progression of the displaced chronic left history of rib fractures status post ORIF.  Received pain medications fluid and admitted to the hospital.   Assessment & Plan:   Mechanical fall Acute kidney injury Mild rhabdomyolysis  Continues to have some improvement of renal functions and CK levels. Off iv fluids now.  Encourage oral intake.  Intake output monitoring. Work with PT OT.  Will focus on pain management as below.  Lung cancer with bony mets, metastatic pain and chronic acetabular fracture. Known lung cancer with mets. Currently on immunotherapy followed by Dr. Truett Perna.  Pain management with Norco and fentanyl, will provide with injectable pain medication to facilitate working with PT OT. Significant pain due to chronic acetabular fracture.  Family wanted second opinion, admitting provider has discussed case with Dr. Carola Frost and recommended outpatient follow-up. Appreciate palliative care  input, pain management as below. Scheduled Tylenol, oxycodone, fentanyl 12 mcg/h.  Today well-controlled.  Chronic medical issues including Essential hypertension: Hold lisinopril hydrochlorothiazide in the setting of AKI.  May discontinue. Hyperlipidemia: On statin. Coronary artery disease: Status post stent.  On aspirin, Crestor, metoprolol and Imdur. Anxiety and insomnia: On Xanax and trazodone. Restless leg syndrome: On ropinirole. Type 2 diabetes: Hold metformin from home due to AKI.  Keep on sliding scale insulin.  Goal of care: Seen by palliative care.  DNR with intervention. Wants to continue treatment including chemotherapy.  She is on Keytruda now. Patient is planning to move to assisted living facility at [facility] when they have a spot available which is appropriate for her.  She will benefit with short-term rehab.  Referred to a SNF.   DVT prophylaxis: enoxaparin (LOVENOX) injection 30 mg Start: 03/31/23 1800   Code Status: Full code Family Communication: Daughter at the bedside Disposition Plan: Status is: Inpatient.  Significant renal function abnormalities.     Consultants:  Palliative care,   Procedures:  None  Antimicrobials:  None   Subjective:  Patient was seen and examined.  Daughter at the bedside.  She sleeps on the couch.  Pain is controlled today but she has not mobilized.  Reluctantly agreeable to go to a SNF.  Objective: Vitals:   04/02/23 1634 04/02/23 2114 04/03/23 0442 04/03/23 0906  BP: (!) 91/47 (!) 130/54 (!) 98/53 133/62  Pulse: 60 (!) 59 (!) 59 64  Resp: 15 18 18 18   Temp: 97.8 F (36.6 C) 97.6 F (36.4 C) 98.5 F (36.9 C) 97.8 F (36.6 C)  TempSrc:  Oral Oral Oral  SpO2: 94%  94% 95% 97%    Intake/Output Summary (Last 24 hours) at 04/03/2023 1310 Last data filed at 04/03/2023 0515 Gross per 24 hour  Intake 1144.25 ml  Output 0 ml  Net 1144.25 ml   There were no vitals filed for this visit.  Examination:  General  exam: Appears calm and comfortable while resting. Respiratory system: No added sounds. Cardiovascular system: S1 & S2 heard, RRR. Gastrointestinal system: Soft nontender. Central nervous system: Alert and oriented. No focal neurological deficits. Port-A-Cath present right chest wall.    Data Reviewed: I have personally reviewed following labs and imaging studies  CBC: Recent Labs  Lab 03/31/23 1145 04/01/23 0440  WBC 5.2 4.2  NEUTROABS 4.2  --   HGB 9.2* 9.3*  HCT 29.8* 29.7*  MCV 92.3 92.8  PLT 186 147*   Basic Metabolic Panel: Recent Labs  Lab 03/31/23 1145 04/01/23 0440 04/02/23 0451 04/03/23 0607  NA 139 139 136 140  K 3.9 3.8 3.4* 4.4  CL 102 109 107 108  CO2 23 22 22 22   GLUCOSE 127* 96 137* 93  BUN 43* 33* 26* 18  CREATININE 2.29* 1.78* 1.40* 1.23*  CALCIUM 9.9 9.3 9.2 9.9  MG  --   --  2.0  --   PHOS  --   --  2.8  --    GFR: CrCl cannot be calculated (Unknown ideal weight.). Liver Function Tests: Recent Labs  Lab 04/01/23 0440  AST 32  ALT 24  ALKPHOS 72  BILITOT 0.5  PROT 5.5*  ALBUMIN 2.7*   No results for input(s): "LIPASE", "AMYLASE" in the last 168 hours. No results for input(s): "AMMONIA" in the last 168 hours. Coagulation Profile: No results for input(s): "INR", "PROTIME" in the last 168 hours. Cardiac Enzymes: Recent Labs  Lab 03/31/23 1145 04/01/23 0440  CKTOTAL 1,001* 700*   BNP (last 3 results) No results for input(s): "PROBNP" in the last 8760 hours. HbA1C: No results for input(s): "HGBA1C" in the last 72 hours. CBG: Recent Labs  Lab 04/02/23 1118 04/02/23 1636 04/02/23 2114 04/03/23 0724 04/03/23 1124  GLUCAP 121* 129* 130* 113* 183*   Lipid Profile: No results for input(s): "CHOL", "HDL", "LDLCALC", "TRIG", "CHOLHDL", "LDLDIRECT" in the last 72 hours. Thyroid Function Tests: No results for input(s): "TSH", "T4TOTAL", "FREET4", "T3FREE", "THYROIDAB" in the last 72 hours. Anemia Panel: No results for input(s):  "VITAMINB12", "FOLATE", "FERRITIN", "TIBC", "IRON", "RETICCTPCT" in the last 72 hours. Sepsis Labs: No results for input(s): "PROCALCITON", "LATICACIDVEN" in the last 168 hours.  No results found for this or any previous visit (from the past 240 hour(s)).       Radiology Studies: No results found.      Scheduled Meds:  acetaminophen  1,000 mg Oral Q8H   aspirin EC  81 mg Oral Daily   enoxaparin (LOVENOX) injection  30 mg Subcutaneous Q24H   fentaNYL  1 patch Transdermal Q72H   gabapentin  300 mg Oral QHS   insulin aspart  0-9 Units Subcutaneous TID WC   isosorbide mononitrate  30 mg Oral Daily   metoprolol tartrate  12.5 mg Oral BID   mupirocin ointment   Topical Daily   polyethylene glycol  17 g Oral Daily   rOPINIRole  1 mg Oral TID   rosuvastatin  40 mg Oral QHS   sodium chloride flush  3 mL Intravenous Q12H   traZODone  150 mg Oral QHS   Continuous Infusions:     LOS: 2 days    Time  spent: 35 minutes    Dorcas Carrow, MD [facility]

## 2023-04-03 NOTE — TOC Progression Note (Signed)
Transition of Care [facility]) - Progression Note    Patient Details  Name: [name] MRN: [id number] Date of Birth: 1935-07-06  Transition of Care [facility]) CM/SW Contact  Eduard Roux, [place] Phone Number: 04/03/2023, 12:32 PM  Clinical Narrative:     CSW spoke with patient's daughter- CSW introduced self and explained role. CSW discuss  with family short term rehab at [facility]. She states the patient does not want SNF but knows she needs the rehab. Daughter reports, patient has expressed interest in [facility]. CSW explained the SNF process. All questions answered.   Daughter states patient receives [facility]x  every [facility] if needed she states she can transport patient.   TOC will provide bed offer once available. [facility] states no bed available at this time.  Antony Blackbird, MSW, LCSW Clinical Social Worker      Expected Discharge Plan: Skilled Nursing Facility Barriers to Discharge: English as a second language teacher, SNF Pending bed offer  Expected Discharge Plan and Services       Living arrangements for the past 2 months: Single Family Home                 DME Arranged: 3-N-1 DME Agency: [employer] Date DME Agency Contacted: 04/01/23 Time DME Agency Contacted: [phone] Representative spoke with at DME Agency: Vaughan Basta HH Arranged: PT, OT HH Agency: [facility] Date [facility] Agency Contacted: 04/01/23 Time HH Agency Contacted: 1630 Representative spoke with at [facility] Agency: Angie   Social Determinants of Health (SDOH) Interventions SDOH Screenings   Food Insecurity: No Food Insecurity (03/18/2021)  Housing: Low Risk  (03/18/2021)  Transportation Needs: No Transportation Needs (03/18/2021)  Tobacco Use: Medium Risk (03/14/2023)    Readmission Risk Interventions     No data to display

## 2023-04-03 NOTE — Progress Notes (Signed)
Patient ID: APPIE DAMM, female   DOB: Nov 29, 1934, 88 y.o.   MRN: [id number]    Progress Note from the Palliative Medicine Team at [facility]   Patient Name: [name]        Date: 04/03/2023 DOB: 1935-05-04  Age: 88 y.o. MRN#: [id number] Attending Physician: Dorcas Carrow, MD Primary Care Physician: Garlan Fillers, MD Admit Date: 03/31/2023   Reason for Consultation/Follow-up   Establishing goals of care   HPI/ Brief Hospital Review  [name] is a 88 y.o. female with medical history significant of hypertension, hyperlipidemia, diabetes, CAD status post stent, lung cancer with bone mets status post chemo and continued on immunotherapy, anxiety, insomnia, RLS, cervical spine disease status post surgical intervention, chronic left acetabular fracture presenting with falls.  Continued physical and functional decline.  History of falls at home.  Patient transported via EMS to ED for treatment and evaluation   Patient does currently live alone with daughter living nearby.  Her husband passed away few months ago.  Patient continues on immunotherapy for lung cancer which is stable.  She has bone metastases which affects her mobility along with a subsequent chronic left acetabular pathologic fracture.   Patient and her family face treatment option decisions, advanced directive decisions and anticipatory care needs.   Subjective  Extensive chart review has been completed prior to meeting with patient/family  including labs, vital signs, imaging, progress/consult notes, orders, medications and available advance directive documents.    This NP assessed patient at the bedside as a follow up for palliative medicine needs and emotional support.  Patient's daughter [name] at bedside.  Patient tells me that pain is much better controlled with scheduled Tylenol and K-pad.  Continue with this pain regimen for efficacy May consider addition of  steroids. Pain management   -- add Tylenol 1000 mg every 8 hours - K-pad  -Continue previously ordered fentanyl patch 12 milligram as directed and Oxycodone  10 mg every 4 hrs prn   (Ongoing evaluation for efficacy)  -Referral to outpatient palliative clinic with Tina Hawthorn NP    Continued education regarding current medical situation.  Patient continues with oncologic care with Dr. Truett Perna at [facility].  She recognizes her increasing care needs secondary to continued physical and functional decline.  She is currently looking at options as far as assisted living facilities.    Patient is hopeful to maintain her independence as long as possible.  She and her daughter are open to skilled nursing facility for short-term rehab in hopes of increasing strength and mobility.   Today we explored further her thoughts and feelings regarding her current medical situation.  Ms. Hafey became tearful as she expressed a sense of knowing that she was declining.  Independence and quality are of utmost importance to [name]    Education offered today regarding  the importance of continued conversation with her family and the medical providers regarding overall plan of care and treatment options,  ensuring decisions are within the context of the patients values and GOCs.  PMT will continue to support holistically  Questions and concerns addressed   Discussed with primary team and nursing staff   Time:  65  minutes, Discussed with Dr Jerral Ralph and bedside RN  Detailed review of medical records ( labs, imaging, vital signs), medically appropriate exam ( MS, skin, cardia,  resp)   discussed with treatment team, counseling and education to patient, family, staff, documenting clinical information, medication  management, coordination of care    Lorinda Creed NP  Palliative Medicine Team Team Phone # [phone] Pager [phone]

## 2023-04-04 ENCOUNTER — Inpatient Hospital Stay: Payer: Medicare Other

## 2023-04-04 ENCOUNTER — Inpatient Hospital Stay: Payer: Medicare Other | Admitting: Nurse Practitioner

## 2023-04-04 ENCOUNTER — Ambulatory Visit: Payer: Medicare Other

## 2023-04-04 ENCOUNTER — Encounter: Payer: Self-pay | Admitting: *Deleted

## 2023-04-04 DIAGNOSIS — F419 Anxiety disorder, unspecified: Secondary | ICD-10-CM | POA: Diagnosis not present

## 2023-04-04 DIAGNOSIS — N179 Acute kidney failure, unspecified: Secondary | ICD-10-CM | POA: Diagnosis not present

## 2023-04-04 DIAGNOSIS — C3492 Malignant neoplasm of unspecified part of left bronchus or lung: Secondary | ICD-10-CM | POA: Diagnosis not present

## 2023-04-04 DIAGNOSIS — M898X9 Other specified disorders of bone, unspecified site: Secondary | ICD-10-CM

## 2023-04-04 DIAGNOSIS — C7951 Secondary malignant neoplasm of bone: Secondary | ICD-10-CM | POA: Diagnosis not present

## 2023-04-04 LAB — BASIC METABOLIC PANEL
Anion gap: 8 (ref 5–15)
BUN: 16 mg/dL (ref 8–23)
CO2: 24 mmol/L (ref 22–32)
Calcium: 9.6 mg/dL (ref 8.9–10.3)
Chloride: 106 mmol/L (ref 98–111)
Creatinine, Ser: 1.23 mg/dL — ABNORMAL HIGH (ref 0.44–1.00)
GFR, Estimated: 43 mL/min — ABNORMAL LOW (ref >60)
Glucose, Bld: 128 mg/dL — ABNORMAL HIGH (ref 70–99)
Potassium: 4 mmol/L (ref 3.5–5.1)
Sodium: 138 mmol/L (ref 135–145)

## 2023-04-04 LAB — HEMOGLOBIN A1C
Hgb A1c MFr Bld: 7 % — ABNORMAL HIGH (ref 4.8–5.6)
Mean Plasma Glucose: 154.2 mg/dL

## 2023-04-04 LAB — GLUCOSE, CAPILLARY
Glucose-Capillary: 106 mg/dL — ABNORMAL HIGH (ref 70–99)
Glucose-Capillary: 109 mg/dL — ABNORMAL HIGH (ref 70–99)
Glucose-Capillary: 156 mg/dL — ABNORMAL HIGH (ref 70–99)
Glucose-Capillary: 151 mg/dL — ABNORMAL HIGH (ref 70–99)

## 2023-04-04 MED ORDER — FUROSEMIDE 40 MG PO TABS
40.0000 mg | ORAL_TABLET | Freq: Every day | ORAL | Status: DC
  Administered 2023-04-04 – 2023-04-06 (×3): 40 mg via ORAL
  Filled 2023-04-04 (×3): qty 1

## 2023-04-04 MED ORDER — CEFADROXIL 500 MG PO CAPS
500.0000 mg | ORAL_CAPSULE | Freq: Two times a day (BID) | ORAL | Status: AC
  Administered 2023-04-04 – 2023-04-06 (×5): 500 mg via ORAL
  Filled 2023-04-04 (×5): qty 1

## 2023-04-04 NOTE — Progress Notes (Signed)
Occupational Therapy Treatment Patient Details Name: [name] MRN: [id number] DOB: 1935-02-13 Today's Date: 04/04/2023   History of present illness Pt is an 88 y.o. female who presented to the ED 8/31 with EMS transport from home after two falls on the same day with pt requiring assistance to get up from second fall. Pt with chronic Left acetabular fracture with hx of ORIF. PMH: hypertension, hyperlipidemia, diabetes, CAD status post stent, lung cancer with bone mets status post chemo and continued on immunotherapy, anxiety, insomnia, RLS, cervical spine disease status post surgical intervention   OT comments  Pt limited in today's session, suspect due to pain medications making pt more drowsy and decreasing her level of arousal and functional processing. Educated pt on UB strengthening HEP with use of therabands, pt verbalized and demonstrated understanding but did need cueing to recover all of the exercises listed. Handout given to promote carryover, pt initially wanted to ambulated but due to decreased cognition and alertness it was deferred. OT may make efforts to follow-up with patient later today if able. DC plans remain appropriate for SNF.       If plan is discharge home, recommend the following:  A little help with walking and/or transfers;A little help with bathing/dressing/bathroom;Assistance with cooking/housework;Assist for transportation;Help with stairs or ramp for entrance   Equipment Recommendations  BSC/3in1    Recommendations for Other Services      Precautions / Restrictions Precautions Precautions: Fall Restrictions Weight Bearing Restrictions: No Other Position/Activity Restrictions: Ortho Traumatologist consult pending       Mobility Bed Mobility               General bed mobility comments: up in recliner upon entry    Transfers                         Balance                                           ADL  either performed or assessed with clinical judgement   ADL Overall ADL's : Needs assistance/impaired                                       General ADL Comments: Focused session on theraband exercises, was initially going to attempt ambulation but pt with increased drowsiness and not able to sustain attention during therapy session    Extremity/Trunk Assessment              Vision       Perception     Praxis      Cognition Arousal: Obtunded Behavior During Therapy: WFL for tasks assessed/performed Overall Cognitive Status: Difficult to assess                                 General Comments: Pt in/out of sleep, was given oxy 30 mins prior to session. Pt aware of increased drowsiness        Exercises General Exercises - Upper Extremity Shoulder Flexion: AROM, Both, 10 reps, Seated, Theraband Theraband Level (Shoulder Flexion): Level 3 (Green) Shoulder Horizontal ABduction: AROM, Both, 10 reps, Seated, Theraband, Strengthening Theraband Level (Shoulder Horizontal Abduction): Level 3 (Green) Elbow Flexion: AROM, Strengthening, Both,  10 reps, Theraband, Seated Theraband Level (Elbow Flexion): Level 3 (Green) Elbow Extension: AROM, Strengthening, Both, Theraband, Seated Theraband Level (Elbow Extension): Level 3 (Green)    Shoulder Instructions       General Comments      Pertinent Vitals/ Pain       Pain Assessment Pain Assessment: No/denies pain  Home Living                                          Prior Functioning/Environment              Frequency  Min 1X/week        Progress Toward Goals  OT Goals(current goals can now be found in the care plan section)  Progress towards OT goals: Progressing toward goals  Acute Rehab OT Goals Patient Stated Goal: To be as independent as possible and to have less pain OT Goal Formulation: With patient/family Time For Goal Achievement: 04/15/23 Potential to  Achieve Goals: Good  Plan      Co-evaluation                 AM-PAC OT "6 Clicks" Daily Activity     Outcome Measure   Help from another person eating meals?: None Help from another person taking care of personal grooming?: None Help from another person toileting, which includes using toliet, bedpan, or urinal?: A Little Help from another person bathing (including washing, rinsing, drying)?: A Little Help from another person to put on and taking off regular upper body clothing?: A Little Help from another person to put on and taking off regular lower body clothing?: A Little 6 Click Score: 20    End of Session Equipment Utilized During Treatment: Other (comment) (therabands (2))  OT Visit Diagnosis: Unsteadiness on feet (R26.81);Other abnormalities of gait and mobility (R26.89);Repeated falls (R29.6);Muscle weakness (generalized) (M62.81);History of falling (Z91.81);Other (comment)   Activity Tolerance Treatment limited secondary to medical complications (Comment) (level of arousal impacted by pain meds)   Patient Left with call bell/phone within reach;with family/visitor present;in chair;with chair alarm set   Nurse Communication Mobility status        Time: 1120-1130 OT Time Calculation (min): 10 min  Charges: OT General Charges $OT Visit: 1 Visit OT Treatments $Therapeutic Exercise: 8-22 mins  04/04/2023  AB, OTR/L  Acute Rehabilitation Services  Office: [phone]   Tristan Schroeder 04/04/2023, 2:00 PM

## 2023-04-04 NOTE — [place] FL2 (Signed)
[place] MEDICAID FL2 LEVEL OF CARE FORM     IDENTIFICATION  Patient Name: [name] Birthdate: [birth year] Sex: female Admission Date (Current Location): 03/31/2023  [facility] and [place] Number:  Producer, television/film/video and Address:  [facility], [address] [place], [place] [address]      Provider Number: [id number]  Attending Physician Name and Address:  Zigmund Daniel., *  Relative Name and Phone Number:       Current Level of Care: Hospital Recommended Level of Care: Skilled Nursing Facility Prior Approval Number:    Date Approved/Denied:   PASRR Number: [id number]  Discharge Plan: SNF    Current Diagnoses: Patient Active Problem List   Diagnosis Date Noted   AKI (acute kidney injury) (HCC) 03/31/2023   Metastasis to bone (HCC) 04/20/2022   S/P lumbar laminectomy 07/18/2021   Port-A-Cath in place 07/18/2019   Lung cancer, primary, with metastasis from lung to other site, left (HCC) 05/29/2019   Pain in joint of right hip 04/18/2018   Anxiety 06/30/2016   Insomnia 06/30/2016   Nasolacrimal duct obstruction, acquired 02/21/2016   Type 2 diabetes mellitus (HCC) 12/18/2015   S/P cervical spinal fusion 07/30/2013   Coronary artery disease with exertional angina (HCC) 06/03/2013   Restless leg syndrome 06/03/2013   Essential hypertension 06/03/2013   Cardiac murmur 06/03/2013   Hyperlipidemia with target LDL less than 70 06/03/2013    Orientation RESPIRATION BLADDER Height & Weight     Self, Time, Situation  Normal Incontinent Weight:   Height:     BEHAVIORAL SYMPTOMS/MOOD NEUROLOGICAL BOWEL NUTRITION STATUS      Continent Diet (please see discharge summary)  AMBULATORY STATUS COMMUNICATION OF NEEDS Skin   Limited Assist Verbally Other (Comment) (non pressure wound pretibial rigt/lateral , non pressure wound buttocks)                       Personal Care Assistance Level of Assistance  Bathing, Feeding,  Dressing Bathing Assistance: Limited assistance Feeding assistance: Independent Dressing Assistance: Limited assistance     Functional Limitations Info  Sight, Hearing, Speech Sight Info: Adequate Hearing Info: Adequate Speech Info: Adequate    SPECIAL CARE FACTORS FREQUENCY  PT (By licensed PT), OT (By licensed OT)     PT Frequency: 5x per week OT Frequency: 5x per week            Contractures Contractures Info: Not present    Additional Factors Info  Code Status, Allergies Code Status Info: FULL Allergies Info: Codeine           Current Medications (04/04/2023):  This is the current hospital active medication list Current Facility-Administered Medications  Medication Dose Route Frequency Provider Last Rate Last Admin   acetaminophen (TYLENOL) tablet 1,000 mg  1,000 mg Oral Q8H Canary Brim, NP   1,000 mg at 04/04/23 0541   ALPRAZolam Prudy Feeler) tablet 0.25 mg  0.25 mg Oral TID Canary Brim, NP   0.25 mg at 04/03/23 2127   aspirin EC tablet 81 mg  81 mg Oral Daily Synetta Fail, MD   81 mg at 04/03/23 0857   cefadroxil (DURICEF) capsule 500 mg  500 mg Oral BID Zigmund Daniel., MD       enoxaparin (LOVENOX) injection 30 mg  30 mg Subcutaneous Q24H Synetta Fail, MD   30 mg at 04/03/23 1719   fentaNYL (DURAGESIC) 12 MCG/HR 1 patch  1 patch  Transdermal Q72H Synetta Fail, MD   1 patch at 04/02/23 1009   furosemide (LASIX) tablet 40 mg  40 mg Oral Daily Zigmund Daniel., MD       gabapentin (NEURONTIN) capsule 300 mg  300 mg Oral QHS Synetta Fail, MD   300 mg at 04/03/23 2128   HYDROmorphone (DILAUDID) injection 0.5 mg  0.5 mg Intravenous Q4H PRN Sarina Ser M, NP   0.5 mg at 04/01/23 2059   insulin aspart (novoLOG) injection 0-9 Units  0-9 Units Subcutaneous TID WC Synetta Fail, MD   1 Units at 04/02/23 1656   isosorbide mononitrate (IMDUR) 24 hr tablet 30 mg  30 mg Oral Daily Synetta Fail, MD   30 mg at 04/02/23 1009    loperamide (IMODIUM) capsule 2-4 mg  2-4 mg Oral PRN Synetta Fail, MD       metoprolol tartrate (LOPRESSOR) tablet 12.5 mg  12.5 mg Oral BID Synetta Fail, MD   12.5 mg at 04/03/23 2128   mupirocin ointment (BACTROBAN) 2 %   Topical Daily Dorcas Carrow, MD   Given at 04/03/23 0859   ondansetron (ZOFRAN) tablet 8 mg  8 mg Oral Q8H PRN Synetta Fail, MD   8 mg at 03/31/23 2240   oxyCODONE (Oxy IR/ROXICODONE) immediate release tablet 10 mg  10 mg Oral Q4H PRN Canary Brim, NP   10 mg at 04/04/23 0541   polyethylene glycol (MIRALAX / GLYCOLAX) packet 17 g  17 g Oral Daily Ileene Hutchinson, Dawn M, NP   17 g at 04/02/23 1009   polyvinyl alcohol (LIQUIFILM TEARS) 1.4 % ophthalmic solution 1 drop  1 drop Both Eyes TID PRN Synetta Fail, MD       rOPINIRole (REQUIP) tablet 1 mg  1 mg Oral TID Synetta Fail, MD   1 mg at 04/03/23 2127   rosuvastatin (CRESTOR) tablet 40 mg  40 mg Oral QHS Synetta Fail, MD   40 mg at 04/03/23 2128   sodium chloride flush (NS) 0.9 % injection 3 mL  3 mL Intravenous Q12H Synetta Fail, MD   3 mL at 04/03/23 2128   traZODone (DESYREL) tablet 150 mg  150 mg Oral QHS Synetta Fail, MD   150 mg at 04/03/23 2128     Discharge Medications: Please see discharge summary for a list of discharge medications.  Relevant Imaging Results:  Relevant Lab Results:   Additional Information SSN [id number]    chemo tx at  [facility] 1x every 3 weeks - [facility] is willing to provide transportation  Eduard Roux, LCSW

## 2023-04-04 NOTE — Progress Notes (Addendum)
Patient ID: [name], female   DOB: Nov 26, 1934, 88 y.o.   MRN: [id number]    Progress Note from the Palliative Medicine Team at [facility]   Patient Name: [name]        Date: 04/04/2023 DOB: 1934/12/01  Age: 88 y.o. MRN#: [id number] Attending Physician: Zigmund Daniel., * Primary Care Physician: Garlan Fillers, MD Admit Date: 03/31/2023   Reason for Consultation/Follow-up   Establishing goals of care   HPI/ Brief Hospital Review  [name] is a 88 y.o. female with medical history significant of hypertension, hyperlipidemia, diabetes, CAD status post stent, lung cancer with bone mets status post chemo and continued on immunotherapy, anxiety, insomnia, RLS, cervical spine disease status post surgical intervention, chronic left acetabular fracture presenting with falls.  Continued physical and functional decline.  History of falls at home.  Patient transported via EMS to ED for treatment and evaluation   Patient does currently live alone with daughter living nearby.  Her husband passed away few months ago.  Patient continues on immunotherapy for lung cancer which is stable.  She has bone metastases which affects her mobility along with a subsequent chronic left acetabular pathologic fracture.   Patient and her family face treatment option decisions, advanced directive decisions and anticipatory care needs.   Subjective  Extensive chart review has been completed prior to meeting with patient/family  including labs, vital signs, imaging, progress/consult notes, orders, medications and available advance directive documents.    This NP assessed patient at the bedside as a follow up for palliative medicine needs and emotional support.  Patient's daughter [name] at bedside.  Patient tells me that pain is much better controlled with scheduled Tylenol and K-pad.  Continue with this pain regimen for efficacy          May consider  addition of steroids.  Plan of care:  -DNR/DNI Symptom management  Pain  -- add Tylenol 1000 mg every 8 hours - K-pad  -Continue previously ordered fentanyl patch 12 milligram as directed and Oxycodone  10 mg every 4 hrs prn   (Ongoing evaluation for efficacy)   --patient tells me she is doing well as far as   pain control Anxiety - Xanax 0.25 mg po tid  -Referral to outpatient palliative clinic with Royal Hawthorn NP -Follow-up with Dr. Truett Perna as discussed with him directly -SNF for ongoing rehabilitation when medically stable   Patient is hopeful to maintain her independence as long as possible.  She and her daughter are open to skilled nursing facility for short-term rehab in hopes of increasing strength and mobility.    Education offered today regarding  the importance of continued conversation with her family and the medical providers regarding overall plan of care and treatment options,  ensuring decisions are within the context of the patients values and GOCs.  PMT will continue to support holistically  Questions and concerns addressed   Discussed with primary team and nursing staff   Time:  35   minutes,   Detailed review of medical records ( labs, imaging, vital signs), medically appropriate exam ( MS, skin, cardia,  resp)   discussed with treatment team, counseling and education to patient, family, staff, documenting clinical information, medication management, coordination of care    Lorinda Creed NP  Palliative Medicine Team Team Phone # [phone] Pager [phone]

## 2023-04-04 NOTE — Progress Notes (Signed)
PROGRESS NOTE    [name]  YQM:[id number] DOB: September 05, 1934 DOA: 03/31/2023 PCP: Garlan Fillers, MD  Chief Complaint  Patient presents with   Fall    Brief Narrative:   88 year old with history of hypertension, hyperlipidemia, type 2 diabetes, coronary artery disease, lung cancer with bony mets status postchemotherapy and currently on immunotherapy, anxiety, insomnia, cervical spine disease status post surgical interventions, chronic left acetabular fracture who presents from home with fall.  Lives at home, walks with [name] walker apparently does not fit through the door frame so she uses the cane to walk through the door frame.  While using the cane her legs got weak and fell.  She called her daughter who called EMS and transfer her to the emergency room.  Bony metastatic disease and bone pain is [name] major issue.  She has chronic left acetabular pathological fracture.  In the emergency room slightly elevated creatinine from baseline.  CK 1000.  Skeletal survey including CT head, CT C-spine no abnormality.  CT scan of the pelvis showed progression of the displaced chronic left history of rib fractures status post ORIF.  Received pain medications fluid and admitted to the hospital.   Assessment & Plan:   Principal Problem:   AKI (acute kidney injury) (HCC) Active Problems:   Restless leg syndrome   Essential hypertension   Hyperlipidemia with target LDL less than 70   Type 2 diabetes mellitus (HCC)   Anxiety   Insomnia   Lung cancer, primary, with metastasis from lung to other site, left (HCC)   Metastasis to bone (HCC)  Mechanical fall Acute kidney injury Mild rhabdomyolysis removed   Lung cancer with bony mets, metastatic pain and chronic acetabular fracture. Known lung cancer with mets. Currently on immunotherapy followed by Dr. Truett Perna.  Pain management with Norco and fentanyl, will provide with injectable pain medication to facilitate working with PT OT. Significant  pain due to chronic acetabular fracture.  Family wanted second opinion, admitting provider has discussed case with Dr. Carola Frost and recommended outpatient follow-up. Appreciate palliative care input, pain management as below. Scheduled Tylenol, oxycodone, fentanyl 12 mcg/h.  Today well-controlled.   Bilateral Lower Extremity Edema UA not collected Will start lasix and follow   Right Lower Extremity Wound Cellulitis Mild cellulitis around RLE wound Appreciate wound care recs  Chronic medical issues including Essential hypertension: Hold lisinopril hydrochlorothiazide in the setting of AKI.  Hold imdur and metop (BP on soft side). Hyperlipidemia: On statin. Coronary artery disease: Status post stent.  On aspirin, Crestor, metoprolol and Imdur. Anxiety and insomnia: On Xanax and trazodone. Restless leg syndrome: On ropinirole. Type 2 diabetes: Hold metformin from home due to AKI.  Keep on sliding scale insulin.  A1c pending.   Goal of care: Seen by palliative care.  DNR with intervention. Wants to continue treatment including chemotherapy.  She is on Keytruda now. Patient is planning to move to assisted living facility at friend's home when they have [name] spot available which is appropriate for her.  She will benefit with short-term rehab.  Referred to [name].    DVT prophylaxis: lovenox Code Status: dnr Family Communication: daughter at bedside Disposition:   Status is: Inpatient Remains inpatient appropriate because: needs snf placement   Consultants:  Oncology palliative  Procedures:  none  Antimicrobials:  Anti-infectives (From admission, onward)    Start     Dose/Rate Route Frequency Ordered Stop   04/04/23 1045  cefadroxil (DURICEF) capsule 500 mg  500 mg Oral 2 times daily 04/04/23 0954 04/06/23 2159       Subjective: No new complaints  Objective: Vitals:   04/03/23 1700 04/04/23 0533 04/04/23 0856 04/04/23 1723  BP: 123/61 127/81 (!) 125/49 (!) 83/44   Pulse: 60 72 68 (!) 54  Resp:   18 18  Temp:  (!) 97.5 F (36.4 C) 98.4 F (36.9 C)   TempSrc:  Oral    SpO2: 96% 95% 97% 92%    Intake/Output Summary (Last 24 hours) at 04/04/2023 1837 Last data filed at 04/03/2023 2217 Gross per 24 hour  Intake --  Output 0 ml  Net 0 ml   There were no vitals filed for this visit.  Examination:  General exam: Appears calm and comfortable  Respiratory system: unlabored Cardiovascular system: RRR Gastrointestinal system: Abdomen is nondistended, soft and nontender.  Central nervous system: Alert and oriented. No focal neurological deficits. Extremities: bilateral LE edema, wound to R lower leg, mild surrounding erythema and TTP     Data Reviewed: I have personally reviewed following labs and imaging studies  CBC: Recent Labs  Lab 03/31/23 1145 04/01/23 0440  WBC 5.2 4.2  NEUTROABS 4.2  --   HGB 9.2* 9.3*  HCT 29.8* 29.7*  MCV 92.3 92.8  PLT 186 147*    Basic Metabolic Panel: Recent Labs  Lab 03/31/23 1145 04/01/23 0440 04/02/23 0451 04/03/23 0607 04/04/23 0631  NA 139 139 136 140 138  K 3.9 3.8 3.4* 4.4 4.0  CL 102 109 107 108 106  CO2 23 22 22 22 24   GLUCOSE 127* 96 137* 93 128*  BUN 43* 33* 26* 18 16  CREATININE 2.29* 1.78* 1.40* 1.23* 1.23*  CALCIUM 9.9 9.3 9.2 9.9 9.6  MG  --   --  2.0  --   --   PHOS  --   --  2.8  --   --     GFR: CrCl cannot be calculated (Unknown ideal weight.).  Liver Function Tests: Recent Labs  Lab 04/01/23 0440  AST 32  ALT 24  ALKPHOS 72  BILITOT 0.5  PROT 5.5*  ALBUMIN 2.7*    CBG: Recent Labs  Lab 04/03/23 1124 04/03/23 1646 04/03/23 2025 04/04/23 0751 04/04/23 1120  GLUCAP 183* 118* 106* 109* 156*     No results found for this or any previous visit (from the past 240 hour(s)).       Radiology Studies: No results found.      Scheduled Meds:  acetaminophen  1,000 mg Oral Q8H   ALPRAZolam  0.25 mg Oral TID   aspirin EC  81 mg Oral Daily   cefadroxil   500 mg Oral BID   enoxaparin (LOVENOX) injection  30 mg Subcutaneous Q24H   fentaNYL  1 patch Transdermal Q72H   furosemide  40 mg Oral Daily   gabapentin  300 mg Oral QHS   insulin aspart  0-9 Units Subcutaneous TID WC   isosorbide mononitrate  30 mg Oral Daily   metoprolol tartrate  12.5 mg Oral BID   mupirocin ointment   Topical Daily   polyethylene glycol  17 g Oral Daily   rOPINIRole  1 mg Oral TID   rosuvastatin  40 mg Oral QHS   sodium chloride flush  3 mL Intravenous Q12H   traZODone  150 mg Oral QHS   Continuous Infusions:   LOS: 3 days    Time spent: over 30 min    Lacretia Nicks, MD [facility]  To contact the attending provider between 7A-7P or the covering provider during after hours 7P-7A, please log into the web site [email or web address] and access using universal [place] password for that web site. If you do not have the password, please call the hospital operator.  04/04/2023, 6:37 PM

## 2023-04-04 NOTE — Consult Note (Signed)
WOC consulted for RLE wound, trauma from walker WOC consultation performed for same 04/01/23 during this admission. See consult note Will not re-consult for that reason.   Angel Hobdy [facility], CNS, [employer] [phone]

## 2023-04-04 NOTE — Progress Notes (Signed)
Hospital d/c pending. Per Dr. Truett Perna: lab/flush/ov and pembrolizumab + zometa week of 04/09/23. Scheduling message sent.

## 2023-04-04 NOTE — Progress Notes (Addendum)
IP PROGRESS NOTE  Subjective:   [name] is well-known to me with a history of metastatic non-small cell lung cancer, currently being treated with single agent pembrolizumab.  She last received pembrolizumab on 03/14/2011 for and is due for another cycle today. [name] fell in her bathroom 03/31/2023.  Her family could not get her up.  She was transported's room by EMS.  [name] has chronic pain at the left "hip "related to a pathologic acetabular fracture.  She has completed radiation to the left acetabulum and underwent percutaneous fixation of the left acetabular fracture in January 2024.  A CT of the pelvis on 03/31/2023 revealed mild progression of displacement of the left acetabular fracture with no healing.  No new fractures.  No new lytic or blastic lesions.  [name] reports persistent pain at the left hip.  The pain is partially relieved with oxycodone.  The pain is present at rest and with ambulation.  She has been evaluated by physical therapy and a skilled nursing facility for physical therapy is recommended.  [name] would like to be transferred to [facility] if possible.  She hopes to return to an assisted living or independent apartment there. She is anxious and requests an anxiolytic. Objective: Vital signs in last 24 hours: Blood pressure 127/81, pulse 72, temperature (!) 97.5 F (36.4 C), temperature source Oral, resp. rate 18, SpO2 95%.  Intake/Output from previous day: 09/03 0701 - 09/04 0700 In: 240 [P.O.:240] Out: 0   Physical Exam:  HEENT: No thrush Lungs: Clear bilaterally Cardiac: Regular rate and rhythm Abdomen: No hepatosplenomegaly Extremities: Pitting edema at the foot bilaterally Neurologic: Alert and oriented, the foot strength is intact bilaterally.  Difficulty flexing at the left hip secondary to pain  Portacath/PICC-without erythema  Lab Results: No results for input(s): "WBC", "HGB", "HCT", "PLT" in the last 72  hours.  BMET Recent Labs    04/03/23 0607 04/04/23 0631  NA 140 138  K 4.4 4.0  CL 108 106  CO2 22 24  GLUCOSE 93 128*  BUN 18 16  CREATININE 1.23* 1.23*  CALCIUM 9.9 9.6    Medications: I have reviewed the patient's current medications.  Assessment/Plan: Non-small cell lung cancer MRI lumbar spine 04/29/2019- enlarging marrow lesions involving the L1 vertebral body, upper left sacrum and right iliac bone MRI pelvis 04/29/2019- 3.5 cm left iliac bone lesion appears slightly larger; other similar appearing lesions present within the left superior pubic ramus, left superior abdomen acetabulum and upper left sacrum Kappa free light chains with mild elevation 05/12/2019  CTs 05/12/2019- left lower lobe pulmonary mass 3.3 x 3.2 cm; lytic process left iliac bone; spinal lesions; 1.1 cm low-density left kidney lesion; right thyroid enlargement with heterogeneous appearance with potential for multiple discrete lesions Biopsy left lower lobe lung mass 05/26/2019-poorly differentiated carcinoma; positive for cytokeratin 5/6, p63 and TTF-1, no EGFR, BRAF, ALK, ERBB2,ROS, or NTRK alteration Cycle 1 carboplatin/Alimta/pembrolizumab 06/06/2019 Cycle 2 carboplatin/Alimta/pembrolizumab 06/27/2019 Cycle 3 carboplatin/Alimta/pembrolizumab 07/18/2019 Cycle 4 carboplatin/Alimta/pembrolizumab 08/07/2019 CTs 08/27/2019-significant decrease in size of lobulated mass left lower lobe.  Unchanged appearance of subtle bone lesions. Cycle 5 Alimta/pembrolizumab 08/28/2019 Cycle 6 Alimta/pembrolizumab 09/18/2019 Cycle 7 Alimta/pembrolizumab 10/09/2019 Cycle 8 Alimta/pembrolizumab 10/30/2019 Cycle 9 Alimta/pembrolizumab 11/20/2019 CTs 12/09/2019-no evidence of disease progression, left lower lobe nodule slightly decreased in size, stable L1, left sacral, and left pubic ramus metastases Cycle 10 Alimta/pembrolizumab 12/11/2019 Cycle 11 pembrolizumab alone 01/02/2020 (Alimta held due to edema, tenderness, erythema at the  lower legs) Cycle 12  pembrolizumab 01/23/2020 Cycle 13 pembrolizumab 02/12/2020 Cycle 14 pembrolizumab 03/04/2020 CTs 03/18/2020-stable left lower lobe lesion, mild sclerosis at the superior endplate of L1 that was previously hypermetabolic, stable small left upper sacral lucent lesion, previous left superior pubic ramus lesion is occult on the CT, CT head negative for malignancy Cycle 15 pembrolizumab 03/25/2020 Cycle 16 pembrolizumab 04/21/2020 Cycle 17 pembrolizumab 05/17/2020 05/19/2020 bone scan-no definite abnormalities to suggest osseous metastases.  Areas of concern on prior PET-CT involving left iliac bone and L1 vertebral body showed no abnormalities on the current study Cycle 18 Pembrolizumab 06/10/2020 Cycle 19 Pembrolizumab 06/30/2020 CTs 07/16/2020-stable left lower lobe nodule, stable faint superior L1 vertebral lesion, no evidence of disease progression Cycle 20 pembrolizumab 07/21/2020 Cycle 21 Pembrolizumab 08/11/2020 Cycle 22 pembrolizumab 09/01/2020 Cycle 23 Pembrolizumab 09/22/2020 Cycle 24 pembrolizumab 10/13/2020 Cycle 25 Pembrolizumab 11/03/2020 Cycle 26 pembrolizumab 11/26/2020 CTs 12/15/2020- stable left lower lobe mass, stable sclerotic lesion at L2, no evidence of disease progression Cycle 27 pembrolizumab 12/17/2020 PET scan 01/03/2021-1.8 x 1.3 cm left lower lobe nodule similar in size to CT of 12/15/2020 and measures smaller than previous PET/CT from 2020.  Nodule is markedly hypermetabolic.  No evidence for hypermetabolic hilar or mediastinal lymphadenopathy.  Several tiny foci of hypermetabolism identified in bony anatomy raising concern for skeletal metastases.  Comparison of the PET to CT 07/16/2020-lobular left lower lobe pulmonary nodule measured 2.4 x 1.5 cm on the prior study, current study it measured 1.8 x 1.3 cm.  Lesion in the anterior left acetabulum is similar to the 07/16/2020 exam although overlying cortical thinning slightly more pronounced on the current study.   Described lesion in the scapula shows some cortical sclerosis and a tiny central marrow lucency not substantially changed compared to 07/16/2020.  Left third rib lesion shows heterogeneous mineralization similar to 07/16/2020. MRI of cervical spine 01/13/2021-increased left facet edema at C5-C6, severe facet arthrosis on the left at C7-T1 and on the right at C3-C4, no evidence of metastatic disease Cycle 28 Pembrolizumab 01/14/2021 Cycle 29 Pembrolizumab 02/03/2021 Cycle 30 Pembrolizumab 02/24/2021 MRI left hip 03/02/2021-compared to 04/29/2019, slight increase in size of metastases at the left iliac and left acetabulum.  Lesion at the left upper sacrum is less conspicuous and a lesion at the left superior pubic ramus is stable Cycle 31 pembrolizumab 03/17/2021 Radiation to left acetabulum and left iliac 03/30/2021-04/13/2021 Cycle 32 pembrolizumab 04/07/2021 Cycle 33 Pembrolizumab 04/28/2021 Cycle 34 pembrolizumab 05/18/2021 PET scan 06/01/2021-no significant change in size or degree of FDG uptake associated with FDG avid left lower lobe lung nodule compatible with neoplasm.  Stable to improved appearance of multifocal FDG avid bone metastasis. Cycle 35 pembrolizumab 06/10/2021 Cycle 36 pembrolizumab 07/01/2021 Cycle 37 pembrolizumab 07/29/2021 Cycle 38 pembrolizumab 08/19/2021 Cycle 39 Pembrolizumab 09/09/2021 Cycle 40 pembrolizumab 09/30/2021 CT chest 10/20/2021 to evaluate complaints of chest pain-no PE.  Interval increase in left lower lobe mass. Cycle 41 Pembrolizumab 10/21/2021 Cycle 42 pembrolizumab 11/11/2021 PET 11/24/2021-mild increase in size of left lower lobe mass, no evidence of solid organ or nodal metastases, stable multifocal bone metastases.  No new sites of metastatic disease. Cycle 43 pembrolizumab 11/29/2021 Cycle 45 Pembrolizumab 12/20/2021 Cycle 46 Pembrolizumab 01/09/2022 Cycle 47 pembrolizumab 02/01/2022 Cycle 48 Pembrolizumab 02/24/2022 Cycle 49 Pembrolizumab 03/17/2022 Cycle 50 pembrolizumab  04/07/2022 MRI l pelvis 04/12/2022-"new "lesion at the left superior acetabulum, stable bone lesions at the left suprapubic ramus, left S1, and left iliac Cycle 51 pembrolizumab 04/28/2022 Palliative radiation to the left hip and left scapula 04/26/2022-05/09/2022 Cycle 52 pembrolizumab 05/18/2022 PET  06/02/2022-stable left lower lobe tumor, slight increase in hypermetabolism associated with the left acetabular metastasis, additional previously seen bone lesions no longer have hypermetabolism Cycle 53 pembrolizumab 06/08/2022 Cycle 54 pembrolizumab 06/29/2022 Cycle 55 pembrolizumab 07/20/2022 Cycle 56 pembrolizumab 08/10/2022 Cycle 57 pembrolizumab 08/31/2022 Cycle 58 pembrolizumab 09/21/2022 Cycle 59 pembrolizumab 10/12/2022 Cycle 60 Pembrolizumab 11/02/2022 Cycle 61 pembrolizumab 11/23/2022 Cycle 62 Pembrolizumab 12/21/2022 PET scan 12/29/2022-mild increase in size and activity of the left lower lobe mass Cycle 63 Pembrolizumab 01/11/2023 Cycle 64 Pembrolizumab 01/31/2023 Cycle 65 pembrolizumab 02/21/2023 Cycle 66 Pembrolizumab 03/14/2023 CT pelvis 03/31/2023-mild progressive displacement of the left acetabular fracture, no fracture healing, no new fractures, no new lytic or blastic lesions Pain secondary to metastatic lung cancer Chronic back pain Type 2 diabetes Essential hypertension CAD Hyperlipidemia Family history significant for multiple members with breast cancer Grade 1 skin rash 07/18/2019 likely related to immunotherapy.  Topical steroid cream as needed. E. coli urinary tract infection 07/14/2019.  Completed cephalexin. Edema/tenderness at the right greater than left ankle 10/21/2019-etiology unclear, potentially related to systemic therapy or an infection, doxycycline prescribed-improved 10/23/2019; marked improvement 11/20/2019; at office visit 01/02/2020 she reports worsening of lower extremity edema, pain/tenderness, erythema 3 to 4 days following each treatment.  Alimta held 01/02/2020.  Referral  to dermatology. COVID-19 infection 03/30/2020, monoclonal antibody therapy 04/06/2020 Arthritis, possibly related to Pembrolizumab, improved since beginning Medrol, now followed by Dr. Dierdre Forth Pain left femoral head/upper femur/groin-negative plain x-ray; CT pelvis 03/28/2022-no acute appearing bone abnormality at the left hip or involving the proximal left femur, no evidence of acute fracture or dislocation, soft tissues left hip and left groin unremarkable; osseous mets within the left sacrum, left iliac bone and anterior acetabulum without evidence of associated fracture or dislocation. Percutaneous fixation of left pathologic acetabular fracture 08/23/2022 CT pelvis and left hip 11/02/2022-subacute nondisplaced fracture through the supra-acetabular left ilium extending through the acetabular roof, placement of 2 screws within the left ilium extending to the supra-acetabular region with an additional screw traversing the left superior pubic ramus, subtle sclerosis in the left ilium and superior left acetabulum, no new site of metastatic disease. CT pelvis 03/31/2023-mild progressive displacement of the left acetabular fracture, no fracture healing  15.  Admission 03/31/2023 after a fall, difficulty with ambulation secondary to generalized weakness and left hip pain 16. acute renal failure and mild rhabdomyolysis on hospital admission 03/31/2023   [name] has metastatic non-small cell lung cancer.  She has been maintained on pembrolizumab for the past several years without evidence of disease progression.  There is a pathologic fracture of the left acetabulum.  She has pain secondary to the fracture.  It is unclear whether there is active cancer at this site.  The pain did not improve after she underwent surgical fixation in January.  She has been evaluated by orthopedics at [facility] and there are no additional surgical options.  We can add biphosphonate therapy with the hope of improving bone healing.   The plan is to continue pembrolizumab for now.  Pembrolizumab has been continued due to the likelihood of active malignancy in the lung and bones.  We can consider a treatment break.  I would continue the current narcotic pain regimen.  I recommend avoiding steroids as this will counteract the effect of immunotherapy.  Recommendations: Continue pembrolizumab as an outpatient Discharge to skilled nursing facility with continued PT/OT Continue narcotic analgesic regimen Avoid steroids due to immunotherapy Outpatient follow-up will be scheduled at the Cancer center for an office visit, pembrolizumab, and  biphosphonate therapy   LOS: 3 days   Thornton Papas, MD   04/04/2023, 7:37 AM

## 2023-04-05 DIAGNOSIS — N179 Acute kidney failure, unspecified: Secondary | ICD-10-CM | POA: Diagnosis not present

## 2023-04-05 LAB — CBC WITH DIFFERENTIAL/PLATELET
Abs Immature Granulocytes: 0.02 10*3/uL (ref 0.00–0.07)
Basophils Absolute: 0 10*3/uL (ref 0.0–0.1)
Basophils Relative: 0 %
Eosinophils Absolute: 0.2 10*3/uL (ref 0.0–0.5)
Eosinophils Relative: 6 %
HCT: 31.6 % — ABNORMAL LOW (ref 36.0–46.0)
Hemoglobin: 9.8 g/dL — ABNORMAL LOW (ref 12.0–15.0)
Immature Granulocytes: 1 %
Lymphocytes Relative: 12 %
Lymphs Abs: 0.5 10*3/uL — ABNORMAL LOW (ref 0.7–4.0)
MCH: 28.1 pg (ref 26.0–34.0)
MCHC: 31 g/dL (ref 30.0–36.0)
MCV: 90.5 fL (ref 80.0–100.0)
Monocytes Absolute: 0.4 10*3/uL (ref 0.1–1.0)
Monocytes Relative: 10 %
Neutro Abs: 2.8 10*3/uL (ref 1.7–7.7)
Neutrophils Relative %: 71 %
Platelets: 199 10*3/uL (ref 150–400)
RBC: 3.49 MIL/uL — ABNORMAL LOW (ref 3.87–5.11)
RDW: 14.3 % (ref 11.5–15.5)
WBC: 3.9 10*3/uL — ABNORMAL LOW (ref 4.0–10.5)
nRBC: 0 % (ref 0.0–0.2)

## 2023-04-05 LAB — GLUCOSE, CAPILLARY
Glucose-Capillary: 105 mg/dL — ABNORMAL HIGH (ref 70–99)
Glucose-Capillary: 97 mg/dL (ref 70–99)
Glucose-Capillary: 135 mg/dL — ABNORMAL HIGH (ref 70–99)
Glucose-Capillary: 171 mg/dL — ABNORMAL HIGH (ref 70–99)
Glucose-Capillary: 191 mg/dL — ABNORMAL HIGH (ref 70–99)

## 2023-04-05 LAB — COMPREHENSIVE METABOLIC PANEL
ALT: 21 U/L (ref 0–44)
AST: 23 U/L (ref 15–41)
Albumin: 2.8 g/dL — ABNORMAL LOW (ref 3.5–5.0)
Alkaline Phosphatase: 77 U/L (ref 38–126)
Anion gap: 7 (ref 5–15)
BUN: 17 mg/dL (ref 8–23)
CO2: 24 mmol/L (ref 22–32)
Calcium: 9.3 mg/dL (ref 8.9–10.3)
Chloride: 105 mmol/L (ref 98–111)
Creatinine, Ser: 1.34 mg/dL — ABNORMAL HIGH (ref 0.44–1.00)
GFR, Estimated: 39 mL/min — ABNORMAL LOW (ref >60)
Glucose, Bld: 157 mg/dL — ABNORMAL HIGH (ref 70–99)
Potassium: 3.9 mmol/L (ref 3.5–5.1)
Sodium: 136 mmol/L (ref 135–145)
Total Bilirubin: 0.5 mg/dL (ref 0.3–1.2)
Total Protein: 5.9 g/dL — ABNORMAL LOW (ref 6.5–8.1)

## 2023-04-05 LAB — MAGNESIUM: Magnesium: 1.8 mg/dL (ref 1.7–2.4)

## 2023-04-05 LAB — PHOSPHORUS: Phosphorus: 3.1 mg/dL (ref 2.5–4.6)

## 2023-04-05 NOTE — TOC Progression Note (Signed)
Transition of Care [facility]) - Progression Note    Patient Details  Name: [name] MRN: [id number] Date of Birth: 02-07-1935  Transition of Care [facility]) CM/SW Contact  Erin Sons, [place] Phone Number: 04/05/2023, 1:23 PM  Clinical Narrative:     CSW called pt's daughter and provided SNF bed offers. CSW explained [facility] SNF is "considering" though would require pt to reschedule her chemo until after rehab. Daughter is familiar with [facility] and this would be her preference. She is okay with rescheduling pt's chemo.   CSW confirmed [facility] can admit pt pending SNF auth.  Auth request submitted; status pending  Expected Discharge Plan: Skilled Nursing Facility Barriers to Discharge: Insurance Authorization  Expected Discharge Plan and Services       Living arrangements for the past 2 months: Single Family Home                 DME Arranged: 3-N-1 DME Agency: [employer] Date DME Agency Contacted: 04/01/23 Time DME Agency Contacted: 1601 Representative spoke with at DME Agency: Vaughan Basta HH Arranged: PT, OT HH Agency: [facility] Date [facility] Agency Contacted: 04/01/23 Time HH Agency Contacted: 1630 Representative spoke with at [facility] Agency: Angie   Social Determinants of Health (SDOH) Interventions SDOH Screenings   Food Insecurity: No Food Insecurity (03/18/2021)  Housing: Low Risk  (03/18/2021)  Transportation Needs: No Transportation Needs (03/18/2021)  Tobacco Use: Medium Risk (03/14/2023)    Readmission Risk Interventions     No data to display

## 2023-04-05 NOTE — Care Management Important Message (Signed)
Important Message  Patient Details  Name: [name] MRN: [id number] Date of Birth: 1935-01-15   Medicare Important Message Given:  Yes     Margarethe Virgen 04/05/2023, 10:20 AM

## 2023-04-05 NOTE — Progress Notes (Signed)
Physical Therapy Treatment Patient Details Name: [name] MRN: [id number] DOB: 30-Aug-1934 Today's Date: 04/05/2023   History of Present Illness Pt is an 88 y.o. female who presented to the ED 8/31 with EMS transport from home after two falls on the same day with pt requiring assistance to get up from second fall. Pt with chronic Left acetabular fracture with hx of ORIF. PMH: hypertension, hyperlipidemia, diabetes, CAD status post stent, lung cancer with bone mets status post chemo and continued on immunotherapy, anxiety, insomnia, RLS, cervical spine disease status post surgical intervention    PT Comments  Pt continues to slowly progress towards goals. She continues to be limited by weakness. Initially crouched gait pattern that improved for second bout of gait. Pt was able to perform 5x sit to stand from varying surfaces throughout session with good hand placement due to current functional status, home set up and available assistance at home continue to recommend skilled physical therapy services < 3 hours day, 5 days/week on discharge from acute care hospital setting in order to decrease risk for falls, immobility, skin break down, injury and re-hospitalization. Pt was fatigued at end of session.    If plan is discharge home, recommend the following: A little help with walking and/or transfers;A little help with bathing/dressing/bathroom;Help with stairs or ramp for entrance;Assistance with cooking/housework   Can travel by private vehicle     Yes  Equipment Recommendations  Other (comment);BSC/3in1 (TBD)       Precautions / Restrictions Precautions Precautions: Fall Restrictions Weight Bearing Restrictions: No Other Position/Activity Restrictions: Ortho Traumatologist consult pending     Mobility  Bed Mobility Overal bed mobility: Needs Assistance             General bed mobility comments: Pt received in recliner and left in recliner at end of session on pt  request.    Transfers Overall transfer level: Needs assistance Equipment used: Rolling walker (2 wheels) Transfers: Sit to/from Stand Sit to Stand: Min assist           General transfer comment: from recliner and end of bed 2x each and 1x from toilet with good hand placement. Min A for weakness    Ambulation/Gait Ambulation/Gait assistance: Contact guard assist, Min assist Gait Distance (Feet): 48 Feet (12 ft 4x with seated rest breaks) Assistive device: Rolling walker (2 wheels) Gait Pattern/deviations: Step-through pattern, Decreased step length - left, Decreased dorsiflexion - left, Narrow base of support, Knee flexed in stance - left, Knee flexed in stance - right Gait velocity: decreased cadence. Gait velocity interpretation: <1.31 ft/sec, indicative of household ambulator   General Gait Details: Pt initially had crouched gait with heavy cueing for extending through the LE for improved gait. After 12 ft seated rest break, ambulated again for a short distance with improved form for each sequential bout of gait.       Balance Overall balance assessment: Needs assistance, History of Falls Sitting-balance support: Feet supported, Single extremity supported, Bilateral upper extremity supported Sitting balance-Leahy Scale: Fair     Standing balance support: Bilateral upper extremity supported, During functional activity, Reliant on assistive device for balance, Single extremity supported, No upper extremity supported Standing balance-Leahy Scale: Fair Standing balance comment: Fair initially that started to get more impaired with increased time standing to perform self care at the toilet        Cognition Arousal: Alert Behavior During Therapy: [facility] for tasks assessed/performed Overall Cognitive Status: Within Functional Limits for tasks assessed  General Comments General comments (skin integrity, edema, etc.): Daughter present throughout session. Ver  supportive. Pt is very motivated.      Pertinent Vitals/Pain Pain Assessment Pain Assessment: 0-10 Pain Score: 5  Pain Descriptors / Indicators: Aching, Sore, Shooting Pain Intervention(s): Premedicated before session, Monitored during session     PT Goals (current goals can now be found in the care plan section) Acute Rehab PT Goals Patient Stated Goal: Hopes to be able to move into [facility] PT Goal Formulation: With patient Time For Goal Achievement: 04/15/23 Potential to Achieve Goals: Good Progress towards PT goals: Progressing toward goals    Frequency    Min 1X/week      PT Plan  Continue with current POC       AM-PAC PT "6 Clicks" Mobility   Outcome Measure  Help needed turning from your back to your side while in a flat bed without using bedrails?: A Little Help needed moving from lying on your back to sitting on the side of a flat bed without using bedrails?: A Little Help needed moving to and from a bed to a chair (including a wheelchair)?: A Little Help needed standing up from a chair using your arms (e.g., wheelchair or bedside chair)?: A Little Help needed to walk in hospital room?: A Little Help needed climbing 3-5 steps with a railing? : Total 6 Click Score: 16    End of Session Equipment Utilized During Treatment: Gait belt Activity Tolerance: Patient tolerated treatment well Patient left: in chair;with family/visitor present;with call bell/phone within reach Nurse Communication: Mobility status PT Visit Diagnosis: Unsteadiness on feet (R26.81);History of falling (Z91.81);Pain Pain - Right/Left: Left Pain - part of body: Hip     Time: 4742-5956 PT Time Calculation (min) (ACUTE ONLY): 45 min  Charges:    $Therapeutic Activity: 38-52 mins PT General Charges $$ ACUTE PT VISIT: 1 Visit                     Harrel Carina, DPT, CLT  Acute Rehabilitation Services Office: [phone] (Secure chat preferred)    Claudia Desanctis 04/05/2023, 12:45 PM

## 2023-04-05 NOTE — Progress Notes (Signed)
PROGRESS NOTE    [name]  WUJ:[id number] DOB: [birth year] DOA: 03/31/2023 PCP: Tina Fillers, MD  Chief Complaint  Patient presents with   Fall    Brief Narrative:   88 year old with history of hypertension, hyperlipidemia, type 2 diabetes, coronary artery disease, lung cancer with bony mets status postchemotherapy and currently on immunotherapy, anxiety, insomnia, cervical spine disease status post surgical interventions, chronic left acetabular fracture who presents from home with fall.  Lives at home, walks with Rual Vermeer walker apparently does not fit through the door frame so she uses the cane to walk through the door frame.  While using the cane her legs got weak and fell.  She called her daughter who called EMS and transfer her to the emergency room.  Bony metastatic disease and bone pain is [name] major issue.  She has chronic left acetabular pathological fracture.  In the emergency room slightly elevated creatinine from baseline.  CK 1000.  Skeletal survey including CT head, CT C-spine no abnormality.  CT scan of the pelvis showed progression of the displaced chronic left history of rib fractures status post ORIF.  Received pain medications fluid and admitted to the hospital.   Assessment & Plan:   Principal Problem:   AKI (acute kidney injury) (HCC) Active Problems:   Restless leg syndrome   Essential hypertension   Hyperlipidemia with target LDL less than 70   Type 2 diabetes mellitus (HCC)   Anxiety   Insomnia   Lung cancer, primary, with metastasis from lung to other site, left (HCC)   Metastasis to bone (HCC)  Mechanical fall Acute kidney injury Mild rhabdomyolysis   Lung cancer with bony mets, metastatic pain and chronic acetabular fracture. Known lung cancer with mets. Currently on immunotherapy followed by Dr. Truett Perna.  Pain management with Norco and fentanyl, will provide with injectable pain medication to facilitate working with PT OT. Significant pain due  to chronic acetabular fracture.  Family wanted second opinion, admitting provider has discussed case with Dr. Carola Frost and recommended outpatient follow-up. Appreciate palliative care input, pain management as below. Scheduled Tylenol, oxycodone, fentanyl 12 mcg/h.  Comfortable today   Bilateral Lower Extremity Edema UA not collected Will start lasix and follow   Right Lower Extremity Wound Cellulitis Mild cellulitis around RLE wound Appreciate wound care recs  Chronic medical issues including Essential hypertension: Hold lisinopril hydrochlorothiazide in the setting of AKI.  Hold imdur and metop (BP on soft side). Hyperlipidemia: On statin. Coronary artery disease: Status post stent.  On aspirin, Crestor, metoprolol and Imdur. Anxiety and insomnia: On Xanax and trazodone. Restless leg syndrome: On ropinirole. Type 2 diabetes: Hold metformin from home due to AKI.  Keep on sliding scale insulin.  A1c pending.   Goal of care: Seen by palliative care.  DNR with intervention. Wants to continue treatment including chemotherapy.  She is on Keytruda now. Patient is planning to [facility] have [name] spot available which is appropriate for her.  She will benefit with short-term rehab.  [facility].  Awaiting discharge to SNF    DVT prophylaxis: lovenox Code Status: dnr Family Communication: daughter at bedside Disposition:   Status is: Inpatient Remains inpatient appropriate because: needs snf placement   Consultants:  Oncology palliative  Procedures:  none  Antimicrobials:  Anti-infectives (From admission, onward)    Start     Dose/Rate Route Frequency Ordered Stop   04/04/23 1045  cefadroxil (DURICEF) capsule 500 mg  500 mg Oral 2 times daily 04/04/23 0954 04/06/23 2159       Subjective: No new complaints  Objective: Vitals:   04/04/23 2132 04/05/23 0400 04/05/23 0439 04/05/23 0831  BP: (!) 131/56  (!) 106/49  (!) 102/51  Pulse: 63  60 72  Resp: 18   16  Temp: 97.8 F (36.6 C)  (!) 97.5 F (36.4 C) 97.7 F (36.5 C)  TempSrc: Oral  Oral Oral  SpO2: 98%  97% 95%  Weight:  53.3 kg      Intake/Output Summary (Last 24 hours) at 04/05/2023 1449 Last data filed at 04/05/2023 0800 Gross per 24 hour  Intake 480 ml  Output 0 ml  Net 480 ml   Filed Weights   04/05/23 0400  Weight: 53.3 kg    Examination:  General: No acute distress. Cardiovascular: RRR Lungs: unlabored Neurological: Alert and oriented 3. Moves all extremities 4 with equal strength. Cranial nerves II through XII grossly intact. Extremities: bilateral LE edema, dressing to RLE     Data Reviewed: I have personally reviewed following labs and imaging studies  CBC: Recent Labs  Lab 03/31/23 1145 04/01/23 0440 04/05/23 1023  WBC 5.2 4.2 3.9*  NEUTROABS 4.2  --  2.8  HGB 9.2* 9.3* 9.8*  HCT 29.8* 29.7* 31.6*  MCV 92.3 92.8 90.5  PLT 186 147* 199    Basic Metabolic Panel: Recent Labs  Lab 04/01/23 0440 04/02/23 0451 04/03/23 0607 04/04/23 0631 04/05/23 1023  NA 139 136 140 138 136  K 3.8 3.4* 4.4 4.0 3.9  CL 109 107 108 106 105  CO2 22 22 22 24 24   GLUCOSE 96 137* 93 128* 157*  BUN 33* 26* 18 16 17   CREATININE 1.78* 1.40* 1.23* 1.23* 1.34*  CALCIUM 9.3 9.2 9.9 9.6 9.3  MG  --  2.0  --   --  1.8  PHOS  --  2.8  --   --  3.1    GFR: Estimated Creatinine Clearance: 25.8 mL/min (Reena Borromeo) (by C-G formula based on SCr of 1.34 mg/dL (H)).  Liver Function Tests: Recent Labs  Lab 04/01/23 0440 04/05/23 1023  AST 32 23  ALT 24 21  ALKPHOS 72 77  BILITOT 0.5 0.5  PROT 5.5* 5.9*  ALBUMIN 2.7* 2.8*    CBG: Recent Labs  Lab 04/04/23 1120 04/04/23 1632 04/04/23 2140 04/05/23 0726 04/05/23 1112  GLUCAP 156* 105* 151* 97 135*     No results found for this or any previous visit (from the past 240 hour(s)).       Radiology Studies: No results found.      Scheduled Meds:  acetaminophen   1,000 mg Oral Q8H   ALPRAZolam  0.25 mg Oral TID   aspirin EC  81 mg Oral Daily   cefadroxil  500 mg Oral BID   enoxaparin (LOVENOX) injection  30 mg Subcutaneous Q24H   fentaNYL  1 patch Transdermal Q72H   furosemide  40 mg Oral Daily   gabapentin  300 mg Oral QHS   insulin aspart  0-9 Units Subcutaneous TID WC   mupirocin ointment   Topical Daily   polyethylene glycol  17 g Oral Daily   rOPINIRole  1 mg Oral TID   rosuvastatin  40 mg Oral QHS   sodium chloride flush  3 mL Intravenous Q12H   traZODone  150 mg Oral QHS   Continuous Infusions:   LOS: 4 days    Time spent: over 30 min  Lacretia Nicks, MD [facility]   To contact the attending provider between 7A-7P or the covering provider during after hours 7P-7A, please log into the web site [email or web address] and access using universal [place] password for that web site. If you do not have the password, please call the hospital operator.  04/05/2023, 2:49 PM

## 2023-04-06 ENCOUNTER — Inpatient Hospital Stay (HOSPITAL_COMMUNITY): Payer: Medicare Other

## 2023-04-06 ENCOUNTER — Encounter (HOSPITAL_COMMUNITY): Payer: Self-pay | Admitting: Internal Medicine

## 2023-04-06 ENCOUNTER — Encounter: Payer: Self-pay | Admitting: Oncology

## 2023-04-06 ENCOUNTER — Other Ambulatory Visit: Payer: Self-pay

## 2023-04-06 DIAGNOSIS — Z515 Encounter for palliative care: Secondary | ICD-10-CM | POA: Diagnosis not present

## 2023-04-06 DIAGNOSIS — R52 Pain, unspecified: Secondary | ICD-10-CM | POA: Diagnosis not present

## 2023-04-06 DIAGNOSIS — N179 Acute kidney failure, unspecified: Secondary | ICD-10-CM | POA: Diagnosis not present

## 2023-04-06 DIAGNOSIS — M7989 Other specified soft tissue disorders: Secondary | ICD-10-CM | POA: Diagnosis not present

## 2023-04-06 LAB — BASIC METABOLIC PANEL
Anion gap: 11 (ref 5–15)
BUN: 17 mg/dL (ref 8–23)
CO2: 24 mmol/L (ref 22–32)
Calcium: 9.7 mg/dL (ref 8.9–10.3)
Chloride: 103 mmol/L (ref 98–111)
Creatinine, Ser: 1.26 mg/dL — ABNORMAL HIGH (ref 0.44–1.00)
GFR, Estimated: 42 mL/min — ABNORMAL LOW (ref >60)
Glucose, Bld: 104 mg/dL — ABNORMAL HIGH (ref 70–99)
Potassium: 4.2 mmol/L (ref 3.5–5.1)
Sodium: 138 mmol/L (ref 135–145)

## 2023-04-06 LAB — CBC
HCT: 31.5 % — ABNORMAL LOW (ref 36.0–46.0)
Hemoglobin: 9.8 g/dL — ABNORMAL LOW (ref 12.0–15.0)
MCH: 28.2 pg (ref 26.0–34.0)
MCHC: 31.1 g/dL (ref 30.0–36.0)
MCV: 90.5 fL (ref 80.0–100.0)
Platelets: 205 10*3/uL (ref 150–400)
RBC: 3.48 MIL/uL — ABNORMAL LOW (ref 3.87–5.11)
RDW: 14.5 % (ref 11.5–15.5)
WBC: 3.7 10*3/uL — ABNORMAL LOW (ref 4.0–10.5)
nRBC: 0 % (ref 0.0–0.2)

## 2023-04-06 LAB — GLUCOSE, CAPILLARY
Glucose-Capillary: 161 mg/dL — ABNORMAL HIGH (ref 70–99)
Glucose-Capillary: 95 mg/dL (ref 70–99)
Glucose-Capillary: 151 mg/dL — ABNORMAL HIGH (ref 70–99)
Glucose-Capillary: 116 mg/dL — ABNORMAL HIGH (ref 70–99)

## 2023-04-06 LAB — MAGNESIUM: Magnesium: 1.9 mg/dL (ref 1.7–2.4)

## 2023-04-06 LAB — PHOSPHORUS: Phosphorus: 3.7 mg/dL (ref 2.5–4.6)

## 2023-04-06 MED ORDER — POLYETHYLENE GLYCOL 3350 17 G PO PACK: 17.0000 g | PACK | Freq: Every day | ORAL | Status: DC | PRN

## 2023-04-06 MED ORDER — SENNA 8.6 MG PO TABS
2.0000 | ORAL_TABLET | Freq: Every day | ORAL | Status: DC
  Administered 2023-04-06: 17.2 mg via ORAL
  Filled 2023-04-06: qty 2

## 2023-04-06 MED ORDER — IOHEXOL 350 MG/ML SOLN
75.0000 mL | Freq: Once | INTRAVENOUS | Status: AC | PRN
  Administered 2023-04-06: 75 mL via INTRAVENOUS

## 2023-04-06 MED ORDER — DOXYCYCLINE HYCLATE 100 MG PO TABS
100.0000 mg | ORAL_TABLET | Freq: Two times a day (BID) | ORAL | Status: DC
  Administered 2023-04-06 – 2023-04-07 (×3): 100 mg via ORAL
  Filled 2023-04-06 (×3): qty 1

## 2023-04-06 MED ORDER — INFLUENZA VAC A&B SURF ANT ADJ 0.5 ML IM SUSY
0.5000 mL | PREFILLED_SYRINGE | INTRAMUSCULAR | Status: AC
  Administered 2023-04-07: 0.5 mL via INTRAMUSCULAR
  Filled 2023-04-06: qty 0.5

## 2023-04-06 MED ORDER — FUROSEMIDE 40 MG PO TABS
40.0000 mg | ORAL_TABLET | Freq: Two times a day (BID) | ORAL | Status: DC
  Administered 2023-04-06 – 2023-04-07 (×2): 40 mg via ORAL
  Filled 2023-04-06 (×2): qty 1

## 2023-04-06 MED ORDER — AMOXICILLIN-POT CLAVULANATE 500-125 MG PO TABS
1.0000 | ORAL_TABLET | Freq: Two times a day (BID) | ORAL | Status: DC
  Administered 2023-04-06 – 2023-04-07 (×3): 1 via ORAL
  Filled 2023-04-06 (×3): qty 1

## 2023-04-06 NOTE — TOC Progression Note (Signed)
Transition of Care [facility]) - Progression Note    Patient Details  Name: [name] MRN: [id number] Date of Birth: 04/18/1935  Transition of [facility]) CM/SW Contact  Erin Sons, [place] Phone Number: 04/06/2023, 3:40 PM  Clinical Narrative:     [facility] is approved 9/5-9/9 [id number]  CT pending. [facility] can admit tomorrow.   Expected Discharge Plan: Skilled Nursing Facility Barriers to Discharge: Insurance Authorization  Expected Discharge Plan and Services       Living arrangements for the past 2 months: Single Family Home                 DME Arranged: 3-N-1 DME Agency: [employer] Date DME Agency Contacted: 04/01/23 Time DME Agency Contacted: [phone] Representative spoke with at DME Agency: Vaughan Basta HH Arranged: PT, OT HH Agency: [facility] Date [facility] Agency Contacted: 04/01/23 Time HH Agency Contacted: 1630 Representative spoke with at [facility] Agency: Angie   Social Determinants of Health (SDOH) Interventions SDOH Screenings   Food Insecurity: No Food Insecurity (04/05/2023)  Housing: Low Risk  (04/05/2023)  Transportation Needs: No Transportation Needs (04/05/2023)  Utilities: Not At Risk (04/05/2023)  Tobacco Use: Medium Risk (04/06/2023)    Readmission Risk Interventions     No data to display

## 2023-04-06 NOTE — Progress Notes (Signed)
Lower extremity venous duplex completed. Please see CV Procedures for preliminary results.  Shona Simpson, RVT 04/06/23 5:48 PM

## 2023-04-06 NOTE — Progress Notes (Signed)
Daily Progress Note   Patient Name: [name]       Date: 04/06/2023 DOB: 13-Feb-1935  Age: 88 y.o. MRN#: [id number] Attending Physician: Zigmund Daniel., * Primary Care Physician: Garlan Fillers, MD Admit Date: 03/31/2023  Reason for Consultation/Follow-up: Establishing goals of care and Pain control  Subjective: Patient is sitting in her recliner. She reports 8/10 pain. She is hopeful to get discharged soon. Friend and daughter at bedside.  Length of Stay: 5  Current Medications: Scheduled Meds:   acetaminophen  1,000 mg Oral Q8H   ALPRAZolam  0.25 mg Oral TID   amoxicillin-clavulanate  1 tablet Oral Q12H   aspirin EC  81 mg Oral Daily   doxycycline  100 mg Oral Q12H   enoxaparin (LOVENOX) injection  30 mg Subcutaneous Q24H   fentaNYL  1 patch Transdermal Q72H   furosemide  40 mg Oral Daily   gabapentin  300 mg Oral QHS   [START ON 04/07/2023] influenza vaccine adjuvanted  0.5 mL Intramuscular Tomorrow-1000   insulin aspart  0-9 Units Subcutaneous TID WC   mupirocin ointment   Topical Daily   polyethylene glycol  17 g Oral Daily   rOPINIRole  1 mg Oral TID   rosuvastatin  40 mg Oral QHS   senna  2 tablet Oral QHS   sodium chloride flush  3 mL Intravenous Q12H   traZODone  150 mg Oral QHS    Continuous Infusions:   PRN Meds: HYDROmorphone (DILAUDID) injection, loperamide, ondansetron, oxyCODONE, polyethylene glycol, polyvinyl alcohol  Physical Exam Vitals reviewed.  HENT:     Head: Normocephalic and atraumatic.  Cardiovascular:     Rate and Rhythm: Normal rate.  Pulmonary:     Effort: Pulmonary effort is normal.  Skin:    General: Skin is warm and dry.  Neurological:     Mental Status: She is alert and oriented to person, place, and time.   Psychiatric:        Mood and Affect: Mood normal.        Behavior: Behavior normal.        Thought Content: Thought content normal.        Judgment: Judgment normal.             Vital Signs: BP (!) 110/53   Pulse 63  Temp 98.3 F (36.8 C)   Resp 18   Ht 5\' 5"  (1.651 m)   Wt 52 kg   SpO2 93%   BMI 19.08 kg/m  SpO2: SpO2: 93 % O2 Device: O2 Device: Room Air O2 Flow Rate:      Patient Active Problem List   Diagnosis Date Noted   AKI (acute kidney injury) (HCC) 03/31/2023   Metastasis to bone (HCC) 04/20/2022   S/P lumbar laminectomy 07/18/2021   Port-A-Cath in place 07/18/2019   Lung cancer, primary, with metastasis from lung to other site, left (HCC) 05/29/2019   Pain in joint of right hip 04/18/2018   Anxiety 06/30/2016   Insomnia 06/30/2016   Nasolacrimal duct obstruction, acquired 02/21/2016   Type 2 diabetes mellitus (HCC) 12/18/2015   S/P cervical spinal fusion 07/30/2013   Coronary artery disease with exertional angina (HCC) 06/03/2013   Restless leg syndrome 06/03/2013   Essential hypertension 06/03/2013   Cardiac murmur 06/03/2013   Hyperlipidemia with target LDL less than 70 06/03/2013    Palliative Care Assessment & Plan   Patient Profile: [name] is a 88 y.o. female with medical history significant of hypertension, hyperlipidemia, diabetes, CAD status post stent, lung cancer with bone mets status post chemo and continued on immunotherapy, anxiety, insomnia, RLS, cervical spine disease status post surgical intervention, chronic left acetabular fracture presenting with falls.   Assessment: Extensive chart review has been completed prior to meeting with patient/family including labs, vital signs, imaging, progress/consult notes, orders, medications and available advance directive documents.   Pain is better overall but currently a 8/10. Waiting for RN to give prn pain medication. Patient reports some constipation-- we discussed increasing her  bowel regimen. She is hopeful to be discharged to [facility]. She is having a hard time just sitting in the hospital room. She understands she needs a CT of her right leg (where she has a wound) before discharge. She plans to follow up with outpatient palliative at [facility]  Recommendations/Plan: DNR Continue pain regimen Increase medications for constipation. Add Senna and add prn Miralax in addition to scheduled dose. Follow up with outpatient palliative clinic with Royal Hawthorn NP   Code Status:    Code Status Orders  (From admission, onward)           Start     Ordered   04/02/23 1123  Do not attempt resuscitation (DNR) Pre-Arrest Interventions Desired  (Code Status)  Continuous       Question Answer Comment  If pulseless and not breathing No CPR or chest compressions.   In Pre-Arrest Conditions (Patient Has Pulse and Is Breathing) May intubate, use advanced airway interventions and cardioversion/ACLS medications if appropriate or indicated. May transfer to ICU.   Consent: Discussion documented in EHR or advanced directives reviewed      04/02/23 1122          Care plan was discussed with bedside RN  Time spent: 35 minutes  Thank you for allowing the Palliative Medicine Team to assist in the care of this patient.    Sherryll Burger, NP  Please contact Palliative Medicine Team phone at [phone] for questions and concerns.

## 2023-04-06 NOTE — Plan of Care (Signed)
  Problem: Education: Goal: Individualized Educational Video(s) Outcome: Progressing   Problem: Coping: Goal: Ability to adjust to condition or change in health will improve Outcome: Progressing   Problem: Fluid Volume: Goal: Ability to maintain a balanced intake and output will improve Outcome: Progressing   Problem: Health Behavior/Discharge Planning: Goal: Ability to identify and utilize available resources and services will improve Outcome: Progressing Goal: Ability to manage health-related needs will improve Outcome: Progressing

## 2023-04-06 NOTE — Progress Notes (Signed)
PROGRESS NOTE    [name]  NWG:[id number] DOB: 07-06-1935 DOA: 03/31/2023 PCP: Garlan Fillers, MD  Chief Complaint  Patient presents with   Fall    Brief Narrative:   88 year old with history of hypertension, hyperlipidemia, type 2 diabetes, coronary artery disease, lung cancer with bony mets status postchemotherapy and currently on immunotherapy, anxiety, insomnia, cervical spine disease status post surgical interventions, chronic left acetabular fracture who presents from home with fall.  Lives at home, walks with Albi Rappaport walker apparently does not fit through the door frame so she uses the cane to walk through the door frame.  While using the cane her legs got weak and fell.  She called her daughter who called EMS and transfer her to the emergency room.  Bony metastatic disease and bone pain is [name] major issue.  She has chronic left acetabular pathological fracture.  In the emergency room slightly elevated creatinine from baseline.  CK 1000.  Skeletal survey including CT head, CT C-spine no abnormality.  CT scan of the pelvis showed progression of the displaced chronic left history of rib fractures status post ORIF.  Received pain medications fluid and admitted to the hospital.   Assessment & Plan:   Principal Problem:   AKI (acute kidney injury) (HCC) Active Problems:   Restless leg syndrome   Essential hypertension   Hyperlipidemia with target LDL less than 70   Type 2 diabetes mellitus (HCC)   Anxiety   Insomnia   Lung cancer, primary, with metastasis from lung to other site, left (HCC)   Metastasis to bone (HCC)  Mechanical fall Acute kidney injury Mild rhabdomyolysis   Lung cancer with bony mets, metastatic pain and chronic acetabular fracture. Known lung cancer with mets. Currently on immunotherapy followed by Dr. Truett Perna.  Pain management with Norco and fentanyl, will provide with injectable pain medication to facilitate working with PT OT. Significant pain due  to chronic acetabular fracture.  Family wanted second opinion, admitting provider has discussed case with Dr. Carola Frost and recommended outpatient follow-up. Appreciate palliative care input, pain management as below. Scheduled Tylenol, oxycodone, fentanyl 12 mcg/h.  Comfortable today   Bilateral Lower Extremity Edema UA not collected Continue lasix  Follow LE [place]  Consider echo as an outpatient   Right Lower Extremity Wound Cellulitis Mild cellulitis around RLE wound -> she's particularly TTP proximal to wound today, will get CT to r/o deep complication Augmentin/doxy Appreciate wound care recs  Chronic medical issues including Essential hypertension: Hold lisinopril hydrochlorothiazide in the setting of AKI.  Hold imdur and metop (BP on soft side). Hyperlipidemia: On statin. Coronary artery disease: Status post stent.  On aspirin, Crestor, metoprolol and Imdur. Anxiety and insomnia: On Xanax and trazodone. Restless leg syndrome: On ropinirole. Type 2 diabetes: Hold metformin from home due to AKI.  Keep on sliding scale insulin.  A1c 7.   Goal of care: Seen by palliative care.  DNR with intervention. Wants to continue treatment including chemotherapy.  She is on Keytruda now. She will benefit with short-term rehab.  Discharge to [facility] hopefully tomorrow.  Awaiting discharge to SNF    DVT prophylaxis: lovenox Code Status: dnr Family Communication: friend at bedside Disposition:   Status is: Inpatient Remains inpatient appropriate because: needs snf placement   Consultants:  Oncology palliative  Procedures:  none  Antimicrobials:  Anti-infectives (From admission, onward)    Start     Dose/Rate Route Frequency Ordered Stop   04/06/23 1145  doxycycline (VIBRA-TABS) tablet 100 mg  100 mg Oral Every 12 hours 04/06/23 1047     04/06/23 1145  amoxicillin-clavulanate (AUGMENTIN) 500-125 MG per tablet 1 tablet        1 tablet Oral Every 12 hours 04/06/23 1047      04/04/23 1045  cefadroxil (DURICEF) capsule 500 mg        500 mg Oral 2 times daily 04/04/23 0954 04/06/23 0858       Subjective: No new complaints  Objective: Vitals:   04/05/23 2051 04/06/23 0330 04/06/23 0703 04/06/23 0853  BP: (!) 126/47  (!) 125/53 (!) 110/53  Pulse: 69  64 63  Resp: 18  18 18   Temp: 98 F (36.7 C)  (!) 97.5 F (36.4 C) 98.3 F (36.8 C)  TempSrc: Oral  Oral   SpO2: 97%  94% 93%  Weight:  52 kg    Height:  5\' 5"  (1.651 m)      Intake/Output Summary (Last 24 hours) at 04/06/2023 1639 Last data filed at 04/06/2023 1530 Gross per 24 hour  Intake 960 ml  Output --  Net 960 ml   Filed Weights   04/05/23 0400 04/06/23 0330  Weight: 53.3 kg 52 kg    Examination:  General: No acute distress. Cardiovascular: RRR Lungs: unlabored Neurological: Alert and oriented 3. Moves all extremities 4 with equal strength. Cranial nerves II through XII grossly intact. Extremities: RLE with wound, no apparent fluctuance or crepitus -> particularly TTP proximal to wound - bilateral LE edema     Data Reviewed: I have personally reviewed following labs and imaging studies  CBC: Recent Labs  Lab 03/31/23 1145 04/01/23 0440 04/05/23 1023 04/06/23 0727  WBC 5.2 4.2 3.9* 3.7*  NEUTROABS 4.2  --  2.8  --   HGB 9.2* 9.3* 9.8* 9.8*  HCT 29.8* 29.7* 31.6* 31.5*  MCV 92.3 92.8 90.5 90.5  PLT 186 147* 199 205    Basic Metabolic Panel: Recent Labs  Lab 04/02/23 0451 04/03/23 0607 04/04/23 0631 04/05/23 1023 04/06/23 0727  NA 136 140 138 136 138  K 3.4* 4.4 4.0 3.9 4.2  CL 107 108 106 105 103  CO2 22 22 24 24 24   GLUCOSE 137* 93 128* 157* 104*  BUN 26* 18 16 17 17   CREATININE 1.40* 1.23* 1.23* 1.34* 1.26*  CALCIUM 9.2 9.9 9.6 9.3 9.7  MG 2.0  --   --  1.8 1.9  PHOS 2.8  --   --  3.1 3.7    GFR: Estimated Creatinine Clearance: 26.8 mL/min ([place]) (by C-G formula based on SCr of 1.26 mg/dL (H)).  Liver Function Tests: Recent Labs  Lab 04/01/23 0440  04/05/23 1023  AST 32 23  ALT 24 21  ALKPHOS 72 77  BILITOT 0.5 0.5  PROT 5.5* 5.9*  ALBUMIN 2.7* 2.8*    CBG: Recent Labs  Lab 04/05/23 1112 04/05/23 1600 04/05/23 2142 04/06/23 0816 04/06/23 1122  GLUCAP 135* 171* 191* 161* 95     No results found for this or any previous visit (from the past 240 hour(s)).       Radiology Studies: No results found.      Scheduled Meds:  acetaminophen  1,000 mg Oral Q8H   ALPRAZolam  0.25 mg Oral TID   amoxicillin-clavulanate  1 tablet Oral Q12H   aspirin EC  81 mg Oral Daily   doxycycline  100 mg Oral Q12H   enoxaparin (LOVENOX) injection  30 mg Subcutaneous Q24H   fentaNYL  1 patch Transdermal Q72H  furosemide  40 mg Oral Daily   gabapentin  300 mg Oral QHS   [START ON 04/07/2023] influenza vaccine adjuvanted  0.5 mL Intramuscular Tomorrow-1000   insulin aspart  0-9 Units Subcutaneous TID WC   mupirocin ointment   Topical Daily   polyethylene glycol  17 g Oral Daily   rOPINIRole  1 mg Oral TID   rosuvastatin  40 mg Oral QHS   senna  2 tablet Oral QHS   sodium chloride flush  3 mL Intravenous Q12H   traZODone  150 mg Oral QHS   Continuous Infusions:   LOS: 5 days    Time spent: over 30 min    Lacretia Nicks, MD [facility]   To contact the attending provider between 7A-7P or the covering provider during after hours 7P-7A, please log into the web site [email or web address] and access using universal [place] password for that web site. If you do not have the password, please call the hospital operator.  04/06/2023, 4:39 PM

## 2023-04-07 ENCOUNTER — Other Ambulatory Visit: Payer: Self-pay

## 2023-04-07 DIAGNOSIS — K5903 Drug induced constipation: Secondary | ICD-10-CM | POA: Diagnosis not present

## 2023-04-07 DIAGNOSIS — Z515 Encounter for palliative care: Secondary | ICD-10-CM | POA: Diagnosis not present

## 2023-04-07 DIAGNOSIS — N179 Acute kidney failure, unspecified: Secondary | ICD-10-CM | POA: Diagnosis not present

## 2023-04-07 LAB — CBC WITH DIFFERENTIAL/PLATELET
Abs Immature Granulocytes: 0.02 10*3/uL (ref 0.00–0.07)
Basophils Absolute: 0 10*3/uL (ref 0.0–0.1)
Basophils Relative: 0 %
Eosinophils Absolute: 0.3 10*3/uL (ref 0.0–0.5)
Eosinophils Relative: 6 %
HCT: 33.1 % — ABNORMAL LOW (ref 36.0–46.0)
Hemoglobin: 10.4 g/dL — ABNORMAL LOW (ref 12.0–15.0)
Immature Granulocytes: 1 %
Lymphocytes Relative: 12 %
Lymphs Abs: 0.5 10*3/uL — ABNORMAL LOW (ref 0.7–4.0)
MCH: 29.2 pg (ref 26.0–34.0)
MCHC: 31.4 g/dL (ref 30.0–36.0)
MCV: 93 fL (ref 80.0–100.0)
Monocytes Absolute: 0.4 10*3/uL (ref 0.1–1.0)
Monocytes Relative: 10 %
Neutro Abs: 3.1 10*3/uL (ref 1.7–7.7)
Neutrophils Relative %: 71 %
Platelets: 197 10*3/uL (ref 150–400)
RBC: 3.56 MIL/uL — ABNORMAL LOW (ref 3.87–5.11)
RDW: 14.5 % (ref 11.5–15.5)
WBC: 4.3 10*3/uL (ref 4.0–10.5)
nRBC: 0 % (ref 0.0–0.2)

## 2023-04-07 LAB — COMPREHENSIVE METABOLIC PANEL
ALT: 25 U/L (ref 0–44)
AST: 25 U/L (ref 15–41)
Albumin: 3 g/dL — ABNORMAL LOW (ref 3.5–5.0)
Alkaline Phosphatase: 83 U/L (ref 38–126)
Anion gap: 9 (ref 5–15)
BUN: 21 mg/dL (ref 8–23)
CO2: 26 mmol/L (ref 22–32)
Calcium: 9.7 mg/dL (ref 8.9–10.3)
Chloride: 101 mmol/L (ref 98–111)
Creatinine, Ser: 1.42 mg/dL — ABNORMAL HIGH (ref 0.44–1.00)
GFR, Estimated: 36 mL/min — ABNORMAL LOW (ref >60)
Glucose, Bld: 128 mg/dL — ABNORMAL HIGH (ref 70–99)
Potassium: 4.2 mmol/L (ref 3.5–5.1)
Sodium: 136 mmol/L (ref 135–145)
Total Bilirubin: 0.3 mg/dL (ref 0.3–1.2)
Total Protein: 6.4 g/dL — ABNORMAL LOW (ref 6.5–8.1)

## 2023-04-07 LAB — MAGNESIUM: Magnesium: 1.9 mg/dL (ref 1.7–2.4)

## 2023-04-07 LAB — PHOSPHORUS: Phosphorus: 4.1 mg/dL (ref 2.5–4.6)

## 2023-04-07 LAB — GLUCOSE, CAPILLARY
Glucose-Capillary: 106 mg/dL — ABNORMAL HIGH (ref 70–99)
Glucose-Capillary: 101 mg/dL — ABNORMAL HIGH (ref 70–99)

## 2023-04-07 MED ORDER — ROSUVASTATIN CALCIUM 10 MG PO TABS: 10.0000 mg | ORAL_TABLET | Freq: Every day | ORAL | Status: DC

## 2023-04-07 MED ORDER — FUROSEMIDE 20 MG PO TABS: 20.0000 mg | ORAL_TABLET | Freq: Every day | ORAL | 0 refills | Status: DC

## 2023-04-07 MED ORDER — ACETAMINOPHEN 500 MG PO TABS: 1000.0000 mg | ORAL_TABLET | Freq: Three times a day (TID) | ORAL | Status: DC | PRN

## 2023-04-07 MED ORDER — TRAZODONE HCL 150 MG PO TABS: 150.0000 mg | ORAL_TABLET | Freq: Every day | ORAL | Status: DC

## 2023-04-07 MED ORDER — ALPRAZOLAM 0.5 MG PO TABS: 0.2500 mg | ORAL_TABLET | Freq: Every day | ORAL | 0 refills | Status: AC | PRN

## 2023-04-07 MED ORDER — DOXYCYCLINE HYCLATE 100 MG PO TABS: 100.0000 mg | ORAL_TABLET | Freq: Two times a day (BID) | ORAL | 0 refills | Status: AC

## 2023-04-07 MED ORDER — AMOXICILLIN-POT CLAVULANATE 500-125 MG PO TABS: 1.0000 | ORAL_TABLET | Freq: Two times a day (BID) | ORAL | 0 refills | Status: AC

## 2023-04-07 MED ORDER — OXYCODONE HCL 10 MG PO TABS: 10.0000 mg | ORAL_TABLET | ORAL | 0 refills | Status: AC | PRN

## 2023-04-07 MED ORDER — FENTANYL 12 MCG/HR TD PT72
1.0000 | MEDICATED_PATCH | TRANSDERMAL | 0 refills | Status: AC
Start: 2023-04-07 — End: 2023-04-10

## 2023-04-07 NOTE — Progress Notes (Signed)
Pt being d/c, VSS, IV removed, Education complete, PTAR for transport, Report has been called.   Balinda Quails, RN 04/07/2023 11:53 AM

## 2023-04-07 NOTE — Discharge Summary (Signed)
Physician Discharge Summary  BATYA DECHRISTOPHER WJX:[id number] DOB: [birth year] DOA: 03/31/2023  PCP: Garlan Fillers, MD  Admit date: 03/31/2023 Discharge date: 04/07/2023  Time spent: 40 minutes  Recommendations for Outpatient Follow-up:  Follow outpatient CBC/CMP  Follow final CT tib/fib (prelim read per discussion with rads with findings c/w cellulitis - no deep infection) For lower extremity edema -> follow on lasix, needs UA to eval for protein and echo outpatient - adjust as needed  Continue wound care for RLE wound -> needs follow up with wound care center, discharged with abx Adjust pain regimen as needed Reduce crestor with renal function - follow outpatient BP meds on hold at discharge, follow BP outpatient  Ensure follow up with oncology outpatient Follow up with orthopedics/Dr. Carola Frost outpatient for her acetabular fracture  Discharge Diagnoses:  Principal Problem:   AKI (acute kidney injury) (HCC) Active Problems:   Restless leg syndrome   Essential hypertension   Hyperlipidemia with target LDL less than 70   Type 2 diabetes mellitus (HCC)   Anxiety   Insomnia   Lung cancer, primary, with metastasis from lung to other site, left [facility])   Metastasis to bone [facility])   Discharge Condition: stable  Diet recommendation: heart healthy  Filed Weights   04/05/23 0400 04/06/23 0330 04/07/23 2130  Weight: 53.3 kg 52 kg 52 kg    History of present illness:   88 year old with history of hypertension, hyperlipidemia, type 2 diabetes, coronary artery disease, lung cancer with bony mets status postchemotherapy and currently on immunotherapy, anxiety, insomnia, cervical spine disease status post surgical interventions, chronic left acetabular fracture who presents from home with fall.  Lives at home, walks with Shatia Sindoni walker apparently does not fit through the door frame so she uses the cane to walk through the door frame.  While using the cane her legs got weak and fell.    In the  emergency room slightly elevated creatinine from baseline.  CK 1000. CT scan of the pelvis showed progression of the comminuted L acetabular fracture s/p ORIF (no interval fracture healing identified).  She's improved with supportive care.  See below for additional details    Hospital Course:  Assessment and Plan:  Mechanical fall Acute kidney injury Mild rhabdomyolysis improved   Lung cancer with bony mets, metastatic pain and chronic acetabular fracture. Known lung cancer with mets. Currently on immunotherapy followed by Dr. Truett Perna.  Recommending pembrolizumab outpatient, PT/OT at SNF, avoid steroids with immunotherapy - needs outpatient follow up with cancer center for pembrolizumab, bisphosphonate, follow up.  Significant pain due to chronic acetabular fracture.  Family wanted second opinion, admitting provider has discussed case with Dr. Carola Frost and recommended outpatient follow-up. Appreciate palliative care input, pain management as below. Discharge with Tylenol, oxycodone, fentanyl 12 mcg/h.  Comfortable today   Bilateral Lower Extremity Edema UA not collected Continue lasix  Follow LE [place] - negative for DVT Edema is relatively stable, mild improvement - will discharge on lasix (lower dose given mild rise in cr) Consider echo as an outpatient.  Needs UA outpatient.   Right Lower Extremity Wound Cellulitis Mild cellulitis around RLE wound -> she's particularly TTP proximal to wound today, will get CT to r/o deep complication (discussed with rads today, no deep infection or complication - final read will be sometime afternoon) Augmentin/doxy for 7 day course Wound referral  Wound care recs as below   Chronic medical issues including Essential hypertension: Holding lisinopril, hydrochlorothiazide, imdur, and metop at discharge --- follow BP outpatient  Hyperlipidemia: On statin. Coronary artery disease: Status post stent.  On aspirin, Crestor, metoprolol and Imdur. Anxiety  and insomnia: On Xanax and trazodone. Restless leg syndrome: On ropinirole. Type 2 diabetes: A1c 7, resume metformin   Goal of care: Seen by palliative care.  DNR with intervention. Wants to continue treatment including chemotherapy.  She is on Keytruda now. She will benefit with short-term rehab.  Discharge to [facility] today.      Procedures:  LE [place] Summary:  BILATERAL:  - No evidence of deep vein thrombosis seen in the lower extremities,  bilaterally.  -No evidence of popliteal cyst, bilaterally.   Consultations: oncology  Discharge Exam: Vitals:   04/07/23 0856 04/07/23 1055  BP: (!) 88/45 (!) 117/56  Pulse: 62   Resp: 18   Temp: 97.9 F (36.6 C)   SpO2: 93%    Eager to discharge Daughter at bedside  General: No acute distress. Cardiovascular: RRR Lungs: unlabored Neurological: Alert and oriented 3. Moves all extremities 4 with equal strength. Cranial nerves II through XII grossly intact. Extremities: RLE wound stable, less tender proximally - continued bilateral LE edema  Discharge Instructions   Discharge Instructions     Ambulatory referral to Wound Clinic   Complete by: As directed    Call MD for:  difficulty breathing, headache or visual disturbances   Complete by: As directed    Call MD for:  extreme fatigue   Complete by: As directed    Call MD for:  hives   Complete by: As directed    Call MD for:  persistant dizziness or light-headedness   Complete by: As directed    Call MD for:  persistant nausea and vomiting   Complete by: As directed    Call MD for:  redness, tenderness, or signs of infection (pain, swelling, redness, odor or green/yellow discharge around incision site)   Complete by: As directed    Call MD for:  severe uncontrolled pain   Complete by: As directed    Call MD for:  temperature >100.4   Complete by: As directed    Diet - low sodium heart healthy   Complete by: As directed    Discharge instructions   Complete by: As  directed    You were seen after Bodi Palmeri fall and were found to have mild kidney injury and muscle tissue injury.  You've improved with supportive care.   At this time you're stable for discharge.  We'll plan to send you home with antibiotics for your right leg wound and cellulitis (skin infection around the wound).  I'll place Jalesa Thien referral for wound care (as well as wound care instructions).  For your leg swelling, we'll send you home on lasix.  Follow up with your outpatient provider to follow your swelling your weight and your labs.  You lasix dose may need to be adjusted as an outpatient.  You may need an ultrasound of your heart (echocardiogram) to complete your workup.  Ask your PCP or cardiology in follow up.  For your acetabular fracture, you should follow outpatient with orthopedics (Dr. Carola Frost).    For your pain, we'll send you home with 1 gram of tylenol every 8 hours as needed in addition to the fentanyl patch and oxycodone 10 mg every 4 hours as needed.  Follow up with oncology outpatient for further adjustments to this regimen.  We stopped your blood pressure meds.  Please follow up with your outpatient doctor to see if your blood pressure is stable  and whether these need to be resumed.  I reduced your crestor due to your kidney function.  This can be reassessed in the future based on your creatinine (kidney function).  Return for new, recurrent, or worsening symptoms.  Please ask your PCP to request records from this hospitalization so they know what was done and what the next steps will be.   Discharge wound care:   Complete by: As directed    Cleanse abrasion to right lower leg with NS and pat dry. Apply mepitel nonadherent  gauze (LAWSON # [id number]) and mupirocin ointment.  Cover with gauze and kerlix/tape.  Change every day.   Increase activity slowly   Complete by: As directed       Allergies as of 04/07/2023       Reactions   Codeine Nausea And Vomiting, Other (See Comments)    Severe constipation.        Medication List     STOP taking these medications    hydrochlorothiazide 12.5 MG capsule Commonly known as: MICROZIDE   HYDROcodone-acetaminophen 10-325 MG tablet Commonly known as: Norco   isosorbide mononitrate 30 MG 24 hr tablet Commonly known as: IMDUR   lisinopril 10 MG tablet Commonly known as: ZESTRIL   methylPREDNISolone 4 MG tablet Commonly known as: MEDROL   metoprolol tartrate 25 MG tablet Commonly known as: LOPRESSOR   nitroGLYCERIN 0.4 MG SL tablet Commonly known as: NITROSTAT       TAKE these medications    acetaminophen 500 MG tablet Commonly known as: TYLENOL Take 2 tablets (1,000 mg total) by mouth every 8 (eight) hours as needed.   ALPRAZolam 0.5 MG tablet Commonly known as: XANAX Take 0.5 tablets (0.25 mg total) by mouth daily as needed for up to 2 days for anxiety.   amoxicillin-clavulanate 500-125 MG tablet Commonly known as: AUGMENTIN Take 1 tablet by mouth every 12 (twelve) hours for 6 days.   aspirin EC 81 MG tablet Take 1 tablet (81 mg total) by mouth daily. Swallow whole.   carboxymethylcellulose 0.5 % Soln Commonly known as: REFRESH PLUS Place 1 drop into both eyes 3 (three) times daily as needed (dry eyes).   CENTRUM SILVER PO Take 1 tablet by mouth daily.   doxycycline 100 MG tablet Commonly known as: VIBRA-TABS Take 1 tablet (100 mg total) by mouth every 12 (twelve) hours for 6 days.   fentaNYL 12 MCG/HR Commonly known as: DURAGESIC Place 1 patch onto the skin every 3 (three) days for 3 days.   furosemide 20 MG tablet Commonly known as: LASIX Take 1 tablet (20 mg total) by mouth daily. Follow with your PCP (or SNF provider) for repeat labs within 7 days   gabapentin 300 MG capsule Commonly known as: NEURONTIN Take 300 mg by mouth at bedtime.   loperamide 2 MG capsule Commonly known as: IMODIUM Take 2-4 mg by mouth as needed for diarrhea or loose stools.   Magnesium 400 MG Caps Take  250 mg by mouth as needed. Takes with pain med   metFORMIN 500 MG tablet Commonly known as: GLUCOPHAGE Take 500 mg by mouth at bedtime.   ondansetron 8 MG tablet Commonly known as: ZOFRAN Take 8 mg by mouth every 8 (eight) hours as needed for nausea or vomiting.   Oxycodone HCl 10 MG Tabs Take 1 tablet (10 mg total) by mouth every 4 (four) hours as needed for up to 3 days for severe pain or moderate pain.   polyethylene glycol 17 g packet Commonly  known as: MIRALAX / GLYCOLAX Take 17 g by mouth daily.   rOPINIRole 1 MG tablet Commonly known as: REQUIP Take 1 mg by mouth 3 (three) times daily.   rosuvastatin 10 MG tablet Commonly known as: Crestor Take 1 tablet (10 mg total) by mouth daily. What changed:  medication strength how much to take when to take this   traZODone 150 MG tablet Commonly known as: DESYREL Take 1 tablet (150 mg total) by mouth at bedtime. What changed:  medication strength how much to take additional instructions Another medication with the same name was removed. Continue taking this medication, and follow the directions you see here.   Vitamin D (Ergocalciferol) 1.25 MG (50000 UNIT) Caps capsule Commonly known as: DRISDOL Take 50,000 Units by mouth every Sunday.   zinc gluconate 50 MG tablet Take 50 mg by mouth at bedtime.               Durable Medical Equipment  (From admission, onward)           Start     Ordered   04/02/23 0701  For home use only DME 3 n 1  Once        09 /02/24 0700              Discharge Care Instructions  (From admission, onward)           Start     Ordered   04/07/23 0000  Discharge wound care:       Comments: Cleanse abrasion to right lower leg with NS and pat dry. Apply mepitel nonadherent  gauze (LAWSON # [id number]) and mupirocin ointment.  Cover with gauze and kerlix/tape.  Change every day.   04/07/23 1100           Allergies  Allergen Reactions   Codeine Nausea And Vomiting and  Other (See Comments)    Severe constipation.    Follow-up Information     Garlan Fillers, MD Follow up.   Specialty: Internal Medicine Contact information: [address] [place] [address] [phone]         Myrene Galas, MD Follow up.   Specialty: Orthopedic Surgery Why: call for Oleva Koo follow up appointment for your acetabular fracture Contact information: [address] [place] [address] [phone]         Ladene Artist, MD Follow up.   Specialty: Oncology Contact information: [address] [place] [address] [phone]         [facility]              Follow up.   Why: i've placed Anikah Hogge referral, please call if you don't get Alexiya Franqui phone call within the next 2 weeks Contact information: [address] [place] [address] [phone]                 The results of significant diagnostics from this hospitalization (including imaging, microbiology, ancillary and laboratory) are listed below for reference.    Significant Diagnostic Studies: VAS [place] LOWER EXTREMITY VENOUS (DVT)  Result Date: 04/06/2023  Lower Venous DVT Study Patient Name:  BONA RAVI  Date of Exam:   04/06/2023 Medical Rec #: [id number]              Accession #:    [id number] Date of Birth: May 05, 1935              Patient Gender: F Patient Age:  87 years Exam Location:  [facility] Procedure:      VAS [place] LOWER EXTREMITY VENOUS (DVT) Referring Phys: Truly Stankiewicz POWELL JR --------------------------------------------------------------------------------  Indications: Swelling, and Edema.  Risk Factors: Immobility Trauma Hip Fracture past pregnancy. Limitations: Body habitus and limited mobility. Comparison Study: No prior study Performing Technologist: Shona Simpson  Examination Guidelines: Maelin Kurkowski complete evaluation includes B-mode imaging, spectral Doppler, color Doppler, and power Doppler as needed of  all accessible portions of each vessel. Bilateral testing is considered an integral part of Maddisyn Hegwood complete examination. Limited examinations for reoccurring indications may be performed as noted. The reflux portion of the exam is performed with the patient in reverse Trendelenburg.  +---------+---------------+---------+-----------+----------+--------------+ RIGHT    CompressibilityPhasicitySpontaneityPropertiesThrombus Aging +---------+---------------+---------+-----------+----------+--------------+ CFV      Full           Yes      Yes                                 +---------+---------------+---------+-----------+----------+--------------+ SFJ      Full                                                        +---------+---------------+---------+-----------+----------+--------------+ FV Prox  Full                                                        +---------+---------------+---------+-----------+----------+--------------+ FV Mid   Full                                                        +---------+---------------+---------+-----------+----------+--------------+ FV DistalFull                                                        +---------+---------------+---------+-----------+----------+--------------+ PFV      Full                                                        +---------+---------------+---------+-----------+----------+--------------+ POP      Full                                                        +---------+---------------+---------+-----------+----------+--------------+ PTV      Full           Yes      Yes                                 +---------+---------------+---------+-----------+----------+--------------+  PERO     Full                                                        +---------+---------------+---------+-----------+----------+--------------+    +---------+---------------+---------+-----------+----------+---------------+ LEFT     CompressibilityPhasicitySpontaneityPropertiesThrombus Aging  +---------+---------------+---------+-----------+----------+---------------+ CFV      Full           Yes      Yes                                  +---------+---------------+---------+-----------+----------+---------------+ SFJ      Full                                                         +---------+---------------+---------+-----------+----------+---------------+ FV Prox  Full                                                         +---------+---------------+---------+-----------+----------+---------------+ FV Mid   Full                                                         +---------+---------------+---------+-----------+----------+---------------+ FV DistalFull                                                         +---------+---------------+---------+-----------+----------+---------------+ PFV      Full                                                         +---------+---------------+---------+-----------+----------+---------------+ POP      Full           Yes      Yes                                  +---------+---------------+---------+-----------+----------+---------------+ PTV                                                   patent by color +---------+---------------+---------+-----------+----------+---------------+ PERO  patent by color +---------+---------------+---------+-----------+----------+---------------+    Summary: BILATERAL: - No evidence of deep vein thrombosis seen in the lower extremities, bilaterally. -No evidence of popliteal cyst, bilaterally.   *See table(s) above for measurements and observations.    Preliminary    CT PELVIS WO CONTRAST  Result Date: 03/31/2023 CLINICAL DATA:  Pelvic fracture. EXAM: CT PELVIS WITHOUT  CONTRAST TECHNIQUE: Multidetector CT imaging of the pelvis was performed following the standard protocol without intravenous contrast. RADIATION DOSE REDUCTION: This exam was performed according to the departmental dose-optimization program which includes automated exposure control, adjustment of the mA and/or kV according to patient size and/or use of iterative reconstruction technique. COMPARISON:  CT pelvis and left hip 11/02/2022, PET-CT 12/29/2022 and left hip radiographs 01/20/2023. FINDINGS: Urinary Tract: The visualized distal ureters and bladder appear unremarkable. Bowel: No bowel wall thickening, distention or surrounding inflammation identified within the pelvis. Mild distal colonic diverticulosis. Vascular/Lymphatic: No enlarged pelvic lymph nodes identified. Aortoiliac atherosclerosis without evidence of aneurysm. Reproductive: Hysterectomy.  No adnexal mass. Other: No evidence of pelvic ascites or pneumoperitoneum. Musculoskeletal: Unchanged position of the hardware status post left iliac and superior pubic ramus screw fixation. There is no hardware loosening. The inferior left iliac screw traverses the left sacroiliac joint, as before. The left superior pubic ramus screw traverses the superior acetabulum, as before. There is Viaan Knippenberg new small amount of soft tissue thickening around the head of this screw. Previously demonstrated comminuted left acetabular fracture demonstrates mildly progressive displacement, with up to 7 mm of displacement along the pelvic sidewall on image 69/4. No interval fracture healing identified. No new fractures are identified. There is no evidence of proximal femur fracture. Probable small left hip joint effusion with synovial thickening. No new lytic or blastic lesions are identified. There are stable degenerative changes within the sacroiliac joints and lower lumbar spine status post lower lumbar interspinous fusion. No significant pelvic or proximal thigh soft tissue hematoma  or focal muscular atrophy. IMPRESSION: 1. Mildly progressive displacement of the comminuted left acetabular fracture status post ORIF. No interval fracture healing identified. 2. No new fractures are identified. 3. No evidence of hardware loosening. 4.  Aortic Atherosclerosis (ICD10-I70.0). Electronically Signed   By: Carey Bullocks M.D.   On: 03/31/2023 13:00   CT Head Wo Contrast  Result Date: 03/31/2023 CLINICAL DATA:  Ataxia, head trauma; Neck trauma, dangerous injury mechanism (Age 31-64y). EXAM: CT HEAD WITHOUT CONTRAST CT CERVICAL SPINE WITHOUT CONTRAST TECHNIQUE: Multidetector CT imaging of the head and cervical spine was performed following the standard protocol without intravenous contrast. Multiplanar CT image reconstructions of the cervical spine were also generated. RADIATION DOSE REDUCTION: This exam was performed according to the departmental dose-optimization program which includes automated exposure control, adjustment of the mA and/or kV according to patient size and/or use of iterative reconstruction technique. COMPARISON:  PET CT May 31 24. FINDINGS: CT HEAD FINDINGS Brain: No evidence of acute infarction, hemorrhage, hydrocephalus, extra-axial collection or mass lesion/mass effect. Vascular: No hyperdense vessel.  Calcific atherosclerosis. Skull: No acute fracture. Sinuses/Orbits: Clear sinuses.  No acute orbital finding. Other: No mastoid effusions CT CERVICAL SPINE FINDINGS Alignment: No substantial sagittal subluxation. Skull base and vertebrae: Solid C5-C6 ACDF. No acute fracture. No definite suspicious bone lesion. Soft tissues and spinal canal: No prevertebral fluid or swelling. No visible canal hematoma. Disc levels: Multilevel degenerative change, better characterized on prior MRI. Upper chest: Visualized lung apices are clear. IMPRESSION: No evidence of acute abnormality intracranially or in the cervical spine. Electronically Signed  By: Feliberto Harts M.D.   On: 03/31/2023  12:49   CT Cervical Spine Wo Contrast  Result Date: 03/31/2023 CLINICAL DATA:  Ataxia, head trauma; Neck trauma, dangerous injury mechanism (Age 74-64y). EXAM: CT HEAD WITHOUT CONTRAST CT CERVICAL SPINE WITHOUT CONTRAST TECHNIQUE: Multidetector CT imaging of the head and cervical spine was performed following the standard protocol without intravenous contrast. Multiplanar CT image reconstructions of the cervical spine were also generated. RADIATION DOSE REDUCTION: This exam was performed according to the departmental dose-optimization program which includes automated exposure control, adjustment of the mA and/or kV according to patient size and/or use of iterative reconstruction technique. COMPARISON:  PET CT May 31 24. FINDINGS: CT HEAD FINDINGS Brain: No evidence of acute infarction, hemorrhage, hydrocephalus, extra-axial collection or mass lesion/mass effect. Vascular: No hyperdense vessel.  Calcific atherosclerosis. Skull: No acute fracture. Sinuses/Orbits: Clear sinuses.  No acute orbital finding. Other: No mastoid effusions CT CERVICAL SPINE FINDINGS Alignment: No substantial sagittal subluxation. Skull base and vertebrae: Solid C5-C6 ACDF. No acute fracture. No definite suspicious bone lesion. Soft tissues and spinal canal: No prevertebral fluid or swelling. No visible canal hematoma. Disc levels: Multilevel degenerative change, better characterized on prior MRI. Upper chest: Visualized lung apices are clear. IMPRESSION: No evidence of acute abnormality intracranially or in the cervical spine. Electronically Signed   By: Feliberto Harts M.D.   On: 03/31/2023 12:49    Microbiology: No results found for this or any previous visit (from the past 240 hour(s)).   Labs: Basic Metabolic Panel: Recent Labs  Lab 04/02/23 0451 04/03/23 0607 04/04/23 0631 04/05/23 1023 04/06/23 0727 04/07/23 0809  NA 136 140 138 136 138 136  K 3.4* 4.4 4.0 3.9 4.2 4.2  CL 107 108 106 105 103 101  CO2 22 22 24 24  24 26   GLUCOSE 137* 93 128* 157* 104* 128*  BUN 26* 18 16 17 17 21   CREATININE 1.40* 1.23* 1.23* 1.34* 1.26* 1.42*  CALCIUM 9.2 9.9 9.6 9.3 9.7 9.7  MG 2.0  --   --  1.8 1.9 1.9  PHOS 2.8  --   --  3.1 3.7 4.1   Liver Function Tests: Recent Labs  Lab 04/01/23 0440 04/05/23 1023 04/07/23 0809  AST 32 23 25  ALT 24 21 25   ALKPHOS 72 77 83  BILITOT 0.5 0.5 0.3  PROT 5.5* 5.9* 6.4*  ALBUMIN 2.7* 2.8* 3.0*   No results for input(s): "LIPASE", "AMYLASE" in the last 168 hours. No results for input(s): "AMMONIA" in the last 168 hours. CBC: Recent Labs  Lab 03/31/23 1145 04/01/23 0440 04/05/23 1023 04/06/23 0727 04/07/23 0809  WBC 5.2 4.2 3.9* 3.7* 4.3  NEUTROABS 4.2  --  2.8  --  3.1  HGB 9.2* 9.3* 9.8* 9.8* 10.4*  HCT 29.8* 29.7* 31.6* 31.5* 33.1*  MCV 92.3 92.8 90.5 90.5 93.0  PLT 186 147* 199 205 197   Cardiac Enzymes: Recent Labs  Lab 03/31/23 1145 04/01/23 0440  CKTOTAL 1,001* 700*   BNP: BNP (last 3 results) No results for input(s): "BNP" in the last 8760 hours.  ProBNP (last 3 results) No results for input(s): "PROBNP" in the last 8760 hours.  CBG: Recent Labs  Lab 04/06/23 0816 04/06/23 1122 04/06/23 1630 04/06/23 2046 04/07/23 0728  GLUCAP 161* 95 151* 116* 106*       Signed:  Lacretia Nicks MD.  [facility] 04/07/2023, 11:20 AM

## 2023-04-07 NOTE — Progress Notes (Signed)
Daily Progress Note   Patient Name: [name]       Date: 04/07/2023 DOB: Sep 23, 1934  Age: 88 y.o. MRN#: [id number] Attending Physician: Zigmund Daniel., * Primary Care Physician: Garlan Fillers, MD Admit Date: 03/31/2023  Reason for Consultation/Follow-up: Establishing goals of care and Pain control  Subjective: Patient is sitting in her recliner. She is hopeful to get discharged soon. Daughter at bedside.  Length of Stay: 6  Current Medications: Scheduled Meds:   acetaminophen  1,000 mg Oral Q8H   ALPRAZolam  0.25 mg Oral TID   amoxicillin-clavulanate  1 tablet Oral Q12H   aspirin EC  81 mg Oral Daily   doxycycline  100 mg Oral Q12H   enoxaparin (LOVENOX) injection  30 mg Subcutaneous Q24H   fentaNYL  1 patch Transdermal Q72H   furosemide  40 mg Oral BID   gabapentin  300 mg Oral QHS   influenza vaccine adjuvanted  0.5 mL Intramuscular Tomorrow-1000   insulin aspart  0-9 Units Subcutaneous TID WC   mupirocin ointment   Topical Daily   polyethylene glycol  17 g Oral Daily   rOPINIRole  1 mg Oral TID   rosuvastatin  40 mg Oral QHS   senna  2 tablet Oral QHS   sodium chloride flush  3 mL Intravenous Q12H   traZODone  150 mg Oral QHS    Continuous Infusions:   PRN Meds: HYDROmorphone (DILAUDID) injection, loperamide, ondansetron, oxyCODONE, polyethylene glycol, polyvinyl alcohol  Physical Exam Vitals reviewed.  HENT:     Head: Normocephalic and atraumatic.  Cardiovascular:     Rate and Rhythm: Normal rate.  Pulmonary:     Effort: Pulmonary effort is normal.  Skin:    General: Skin is warm and dry.  Neurological:     Mental Status: She is alert and oriented to person, place, and time.  Psychiatric:        Mood and Affect: Mood normal.         Behavior: Behavior normal.        Thought Content: Thought content normal.        Judgment: Judgment normal.             Vital Signs: BP (!) 117/56   Pulse 62   Temp 97.9 F (36.6 C) (Oral)  Resp 18   Ht 5\' 5"  (1.651 m)   Wt 52 kg   SpO2 93%   BMI 19.08 kg/m  SpO2: SpO2: 93 % O2 Device: O2 Device: Room Air O2 Flow Rate:      Patient Active Problem List   Diagnosis Date Noted   AKI (acute kidney injury) (HCC) 03/31/2023   Metastasis to bone (HCC) 04/20/2022   S/P lumbar laminectomy 07/18/2021   Port-A-Cath in place 07/18/2019   Lung cancer, primary, with metastasis from lung to other site, left (HCC) 05/29/2019   Pain in joint of right hip 04/18/2018   Anxiety 06/30/2016   Insomnia 06/30/2016   Nasolacrimal duct obstruction, acquired 02/21/2016   Type 2 diabetes mellitus (HCC) 12/18/2015   S/P cervical spinal fusion 07/30/2013   Coronary artery disease with exertional angina (HCC) 06/03/2013   Restless leg syndrome 06/03/2013   Essential hypertension 06/03/2013   Cardiac murmur 06/03/2013   Hyperlipidemia with target LDL less than 70 06/03/2013    Palliative Care Assessment & Plan   Patient Profile: [name] is a 88 y.o. female with medical history significant of hypertension, hyperlipidemia, diabetes, CAD status post stent, lung cancer with bone mets status post chemo and continued on immunotherapy, anxiety, insomnia, RLS, cervical spine disease status post surgical intervention, chronic left acetabular fracture presenting with falls.   Assessment: Extensive chart review has been completed prior to meeting with patient/family including labs, vital signs, imaging, progress/consult notes, orders, medications and available advance directive documents.   Pain is better overall and she is excited to have follow up with outpatient palliative care clinic after discharge. Patient has still not had a bowel movement. If no bowel movement by lunchtime patient will  ask for her prn Miralax. She is hopeful to be discharged to [facility]. She is having a hard time being in the small room without much activity. I asked the RN if patient could get up and walk in hallway today to help her morale and constipation.   Recommendations/Plan: DNR Continue pain regimen Continue bowel regimen- increase if no bowel movement today Follow up with outpatient palliative clinic with Royal Hawthorn NP   Code Status:    Code Status Orders  (From admission, onward)           Start     Ordered   04/02/23 1123  Do not attempt resuscitation (DNR) Pre-Arrest Interventions Desired  (Code Status)  Continuous       Question Answer Comment  If pulseless and not breathing No CPR or chest compressions.   In Pre-Arrest Conditions (Patient Has Pulse and Is Breathing) May intubate, use advanced airway interventions and cardioversion/ACLS medications if appropriate or indicated. May transfer to ICU.   Consent: Discussion documented in EHR or advanced directives reviewed      04/02/23 1122          Care plan was discussed with bedside RN  Time spent: 35 minutes  Thank you for allowing the Palliative Medicine Team to assist in the care of this patient.    Sherryll Burger, NP  Please contact Palliative Medicine Team phone at [phone] for questions and concerns.

## 2023-04-07 NOTE — TOC Transition Note (Signed)
Transition of Care [facility]) - CM/SW Discharge Note   Patient Details  Name: [name] MRN: [id number] Date of Birth: [birth year]  Transition of Care [facility]) CM/SW Contact:  Deatra Robinson, [place] Phone Number: 04/07/2023, 12:25 PM   Clinical Narrative:  pt for dc to [facility] today. Spoke to [place] in admissions who confirmed they are prepared to admit pt to room 611. Pt's dtr at bedside and agreeable to dc plan. RN provide with number for report and [facility] arranged for transport. SW signing off at dc.   Dellie Burns, MSW, LCSW [phone] (coverage)       Final next level of care: Skilled Nursing Facility Barriers to Discharge: No Barriers Identified   Patient Goals and CMS Choice CMS Medicare.gov Compare Post Acute Care list provided to:: Patient Choice offered to / list presented to : Patient  Discharge Placement                Patient chooses bed at: [facility] Patient to be transferred to facility by: [facility] Name of family member notified: [name]/dtr Patient and family notified of of transfer: 04/07/23  Discharge Plan and Services Additional resources added to the After Visit Summary for                  DME Arranged: 3-N-1 DME Agency: [employer] Date DME Agency Contacted: 04/01/23 Time DME Agency Contacted: 1601 Representative spoke with at DME Agency: Vaughan Basta HH Arranged: PT, OT HH Agency: [facility] Date [facility] Agency Contacted: 04/01/23 Time HH Agency Contacted: 1630 Representative spoke with at [facility] Agency: Angie  Social Determinants of Health (SDOH) Interventions SDOH Screenings   Food Insecurity: No Food Insecurity (04/05/2023)  Housing: Low Risk  (04/05/2023)  Transportation Needs: No Transportation Needs (04/05/2023)  Utilities: Not At Risk (04/05/2023)  Tobacco Use: Medium Risk (04/06/2023)     Readmission Risk Interventions     No data to display

## 2023-04-09 ENCOUNTER — Telehealth: Payer: Self-pay

## 2023-04-09 DIAGNOSIS — L03115 Cellulitis of right lower limb: Secondary | ICD-10-CM | POA: Diagnosis not present

## 2023-04-09 DIAGNOSIS — B370 Candidal stomatitis: Secondary | ICD-10-CM

## 2023-04-09 DIAGNOSIS — F419 Anxiety disorder, unspecified: Secondary | ICD-10-CM

## 2023-04-09 DIAGNOSIS — E119 Type 2 diabetes mellitus without complications: Secondary | ICD-10-CM | POA: Diagnosis not present

## 2023-04-09 DIAGNOSIS — G4700 Insomnia, unspecified: Secondary | ICD-10-CM

## 2023-04-09 DIAGNOSIS — G2581 Restless legs syndrome: Secondary | ICD-10-CM | POA: Diagnosis not present

## 2023-04-09 DIAGNOSIS — N179 Acute kidney failure, unspecified: Secondary | ICD-10-CM

## 2023-04-09 DIAGNOSIS — C7951 Secondary malignant neoplasm of bone: Secondary | ICD-10-CM

## 2023-04-09 DIAGNOSIS — I10 Essential (primary) hypertension: Secondary | ICD-10-CM | POA: Diagnosis not present

## 2023-04-09 NOTE — Telephone Encounter (Signed)
Pt's daughter [name]) called stating pt was seen by Palliative Care while in the hospital.  Pt was d/c to a rehab but was told that Palliative Care would continue to manage the pt's pain medications and symptoms.  Pt is a pt of Dr. Danella Penton but contacted Dr. Latanya Maudlin office regarding the pt.  Informed pt's daughter that this nurse forward her message to Dr. Roxan Hockey office and to Mayra Reel, NP w/Palliative Care.

## 2023-04-10 DIAGNOSIS — B370 Candidal stomatitis: Secondary | ICD-10-CM

## 2023-04-10 DIAGNOSIS — G4700 Insomnia, unspecified: Secondary | ICD-10-CM | POA: Diagnosis not present

## 2023-04-10 DIAGNOSIS — C7951 Secondary malignant neoplasm of bone: Secondary | ICD-10-CM

## 2023-04-10 DIAGNOSIS — Z7689 Persons encountering health services in other specified circumstances: Secondary | ICD-10-CM

## 2023-04-10 DIAGNOSIS — G2581 Restless legs syndrome: Secondary | ICD-10-CM

## 2023-04-10 DIAGNOSIS — E119 Type 2 diabetes mellitus without complications: Secondary | ICD-10-CM | POA: Diagnosis not present

## 2023-04-10 DIAGNOSIS — L03115 Cellulitis of right lower limb: Secondary | ICD-10-CM

## 2023-04-10 DIAGNOSIS — F419 Anxiety disorder, unspecified: Secondary | ICD-10-CM | POA: Diagnosis not present

## 2023-04-10 DIAGNOSIS — I10 Essential (primary) hypertension: Secondary | ICD-10-CM | POA: Diagnosis not present

## 2023-04-11 NOTE — Progress Notes (Signed)
Cardiology Office Note:  .   Date:  04/16/2023  ID:  [name], DOB 1935/04/09, MRN [id number] PCP: Garlan Fillers, MD  [facility] Providers Cardiologist:  Nicki Guadalajara, MD  History of Present Illness: .   [name] is a 88 y.o. female with CAD, non-small cell lung cancer with mets to bone, chronic back pain, HTN, HLD, arthritis. She is followed by Dr. Tresa Endo and presents today for a 6 month follow up appointment   Per chart review, patient previously had a cardiac catheterization in 09/2010 and underwent stenting of a 90% eccentric RCA stenosis with a bare-metal stent.  She developed recurrent chest pain and had repeat heart catheterization in 01/2011 that revealed widely patent stent in the RCA, 60% postural diagonal 1 stenosis that was medically managed.  She later had a nuclear stress test in 05/2015 that showed normal perfusion without scar or ischemia.  She was diagnosed with non-small cell lung cancer stage IV with metastatic bone lesions in 2020.  She had an echocardiogram in 09/2019 that showed EF 60-65%, moderate asymmetric LVH, grade 1 diastolic dysfunction.  In 10/2021, patient complained of shortness of breath and chest tightness.  Symptoms were similar to prior anginal symptoms.  She had an echocardiogram on 11/09/2021 that showed EF 60-65%, no regional wall motion abnormalities, grade 1 diastolic dysfunction, normal RV function.  She had a left heart catheterization on 11/15/2021 that showed very mild nonobstructive CAD.  There was 10% stenosis in proximal RCA, 25% stenosis in second diagonal, 10% stenosis in proximal LAD.  Previously placed mid RCA stent was widely patent.  Recommend optimizing medical therapy.  Patient was last seen by Dr. Tresa Endo on 06/06/2022.  At that time, patient denied chest pain or shortness of breath.  Noted that occasionally her blood pressure would be in the 90s systolic in the mornings.  EKG showed heart rate 52 bpm and she had  first-degree AV block.  Her metoprolol was reduced to 12.5 mg twice daily.  In 05/2022, patient had a fracture of left acetabulum and underwent ORIF.   Patient was recently admitted to the hospital from 8/31-9/7 with AKI, RLE wound.  Patient presented to the ED after her legs got weak and she had a mechanical fall.  In the ED, found to have an elevated creatinine from baseline, CK 1000.  CT scan of the pelvis showed progression of the comminuted L acetabular fracture s/p ORIF. Treated with Abx for right lower leg cellulitis and was discharged with wound care follow up. She was also seen by palliative care that admission for pain management with chronic acetabular fracture. She was discharged to short-term rehab   Patient reports that she occasionally has low BP in the morning- sometimes as low as the 80s/50s. BP usually improves throughout the day and often reaches the 120s/80s. Majority of her BP medications had been discontinued after her fall, and she is currently only taking metoprolol and lasix. Denies syncope, near syncope. Sometimes gets dizzy if she stands up to quickly, but she has been working on standing up slower and letting herself get her bearings. She had significant ankle edema when in the hospital earlier this month, and the swelling has significantly improved on lasix. She denies any chest pain, shortness of breath, orthopnea. No palpitations. Has been tired recently. Has chronic hip pain due to her left acetabular fracture. Follows closely with oncology   ROS: No chest pain, shortness of breath, orthopnea, syncope, near syncope. Does feel fatigued,  has left hip pain, ankle edema.   Studies Reviewed: .   Cardiac Studies & Procedures   CARDIAC CATHETERIZATION  CARDIAC CATHETERIZATION 11/15/2021  Narrative   Prox RCA lesion is 10% stenosed.   2nd Diag lesion is 25% stenosed.   Prox LAD lesion is 10% stenosed.   Previously placed Mid RCA stent (unknown type) is  widely patent.  Very  mild nonobstructive CAD with evidence for coronary calcification.  The LAD has mild calcification and luminal irregularity proximally with smooth 25% diagonal stenosis; mild calcification of the left circumflex coronary artery; and widely patent mid RCA stent with smooth 10% narrowing proximal to the stented segment.  LVEDP 16 mmHg.  Previous echo Doppler study has shown an EF of 60 to 65%.  RECOMMENDATION: Medical therapy with optimal blood pressure control and lipid management.  Findings Coronary Findings Diagnostic  Dominance: Right  Left Anterior Descending Vessel is angiographically normal. Prox LAD lesion is 10% stenosed.  Second Diagonal Branch 2nd Diag lesion is 25% stenosed.  Ramus Intermedius Vessel is small.  Right Coronary Artery Prox RCA lesion is 10% stenosed. Previously placed Mid RCA stent (unknown type) is  widely patent.  Intervention  No interventions have been documented.     ECHOCARDIOGRAM  ECHOCARDIOGRAM COMPLETE 11/09/2021  Narrative ECHOCARDIOGRAM REPORT    [name] Date of Exam: 11/09/2021 Medical Rec #:  [id number]             Height:       65.0 in Accession #:    [id number]            Weight:       185.4 lb Date of Birth:  1935/04/19             BSA:          1.915 m Patient Age:    86 years              BP:           119/62 mmHg Patient Gender: F                     HR:           54 bpm. Exam Location:  [address]  Procedure: 2D Echo, 3D Echo, Cardiac Doppler, Color Doppler and Strain Analysis  Indications:    R07.9 Chest Pain  History:        Patient has prior history of Echocardiogram examinations, most recent 09/26/2019. CAD, Lung cancer, Signs/Symptoms:Murmur; Risk Factors:Diabetes and HLD.  Sonographer:    Clearence Ped RCS Referring Phys: [phone] THOMAS A KELLY  IMPRESSIONS   1. Left ventricular ejection fraction, by estimation, is 60 to 65%. Left ventricular ejection fraction by 3D volume is 66 %.  The left ventricle has normal function. The left ventricle has no regional wall motion abnormalities. Left ventricular diastolic parameters are consistent with Grade I diastolic dysfunction (impaired relaxation). The average left ventricular global longitudinal strain is -18.5 %. The global longitudinal strain is normal. 2. Right ventricular systolic function is normal. The right ventricular size is normal. There is normal pulmonary artery systolic pressure. The estimated right ventricular systolic pressure is 22.4 mmHg. 3. The mitral valve is normal in structure. No evidence of mitral valve regurgitation. No evidence of mitral stenosis. 4. The aortic valve is tricuspid. There is mild calcification of the aortic valve. Aortic valve regurgitation is trivial. Aortic valve sclerosis is present, with no evidence of aortic  valve stenosis. 5. The inferior vena cava is normal in size with greater than 50% respiratory variability, suggesting right atrial pressure of 3 mmHg.  Comparison(s): No significant change from prior study. Prior images reviewed side by side.  FINDINGS Left Ventricle: Left ventricular ejection fraction, by estimation, is 60 to 65%. Left ventricular ejection fraction by 3D volume is 66 %. The left ventricle has normal function. The left ventricle has no regional wall motion abnormalities. The average left ventricular global longitudinal strain is -18.5 %. The global longitudinal strain is normal. The left ventricular internal cavity size was normal in size. There is no left ventricular hypertrophy. Left ventricular diastolic parameters are consistent with Grade I diastolic dysfunction (impaired relaxation). Indeterminate filling pressures.  Right Ventricle: The right ventricular size is normal. No increase in right ventricular wall thickness. Right ventricular systolic function is normal. There is normal pulmonary artery systolic pressure. The tricuspid regurgitant velocity is 2.20 m/s,  and with an assumed right atrial pressure of 3 mmHg, the estimated right ventricular systolic pressure is 22.4 mmHg.  Left Atrium: Left atrial size was normal in size.  Right Atrium: Right atrial size was normal in size.  Pericardium: There is no evidence of pericardial effusion.  Mitral Valve: The mitral valve is normal in structure. No evidence of mitral valve regurgitation. No evidence of mitral valve stenosis.  Tricuspid Valve: The tricuspid valve is normal in structure. Tricuspid valve regurgitation is trivial. No evidence of tricuspid stenosis.  Aortic Valve: The aortic valve is tricuspid. There is mild calcification of the aortic valve. Aortic valve regurgitation is trivial. Aortic valve sclerosis is present, with no evidence of aortic valve stenosis.  Pulmonic Valve: The pulmonic valve was normal in structure. Pulmonic valve regurgitation is trivial. No evidence of pulmonic stenosis.  Aorta: The aortic root is normal in size and structure.  Venous: The inferior vena cava is normal in size with greater than 50% respiratory variability, suggesting right atrial pressure of 3 mmHg.  IAS/Shunts: No atrial level shunt detected by color flow Doppler.   LEFT VENTRICLE PLAX 2D LVIDd:         3.80 cm         Diastology LVIDs:         2.80 cm         LV e' medial:    6.20 cm/s LV PW:         1.00 cm         LV E/e' medial:  11.2 LV IVS:        1.10 cm         LV e' lateral:   5.44 cm/s LVOT diam:     1.60 cm         LV E/e' lateral: 12.7 LV SV:         45 LV SV Index:   23              2D LVOT Area:     2.01 cm        Longitudinal Strain 2D Strain GLS  -17.8 % (A2C): 2D Strain GLS  -17.4 % (A3C): 2D Strain GLS  -20.3 % (A4C): 2D Strain GLS  -18.5 % Avg:  3D Volume EF LV 3D EF:    Left ventricul ar ejection fraction by 3D volume is 66 %.  3D Volume EF: 3D EF:        66 % LV EDV:       74 ml LV ESV:  25 ml LV SV:        49 ml  RIGHT VENTRICLE RV Basal  diam:  2.80 cm RV S prime:     11.60 cm/s TAPSE (M-mode): 1.9 cm RVSP:           22.4 mmHg  LEFT ATRIUM             Index        RIGHT ATRIUM           Index LA diam:        2.70 cm 1.41 cm/m   RA Pressure: 3.00 mmHg LA Vol (A2C):   32.4 ml 16.92 ml/m  RA Area:     11.20 cm LA Vol (A4C):   24.5 ml 12.79 ml/m  RA Volume:   22.00 ml  11.49 ml/m LA Biplane Vol: 28.1 ml 14.67 ml/m AORTIC VALVE LVOT Vmax:   113.00 cm/s LVOT Vmean:  75.200 cm/s LVOT VTI:    0.222 m  AORTA Ao Root diam: 3.30 cm Ao Asc diam:  3.30 cm  MITRAL VALVE               TRICUSPID VALVE MV Area (PHT):             TR Peak grad:   19.4 mmHg MV Decel Time:             TR Vmax:        220.00 cm/s MV E velocity: 69.20 cm/s  Estimated RAP:  3.00 mmHg MV A velocity: 95.40 cm/s  RVSP:           22.4 mmHg MV E/A ratio:  0.73 SHUNTS Systemic VTI:  0.22 m Systemic Diam: 1.60 cm  Mihai Croitoru MD Electronically signed by Thurmon Fair MD Signature Date/Time: 11/09/2021/9:00:52 PM    Final             Risk Assessment/Calculations:             Physical Exam:   VS:  BP (!) 110/56   Pulse 64   Ht 5\' 5"  (1.651 m)   Wt 168 lb (76.2 kg)   BMI 27.96 kg/m    Wt Readings from Last 3 Encounters:  04/16/23 168 lb (76.2 kg)  04/07/23 114 lb 10.2 oz (52 kg)  03/14/23 171 lb (77.6 kg)    GEN: Well nourished, well developed in no acute distress. Sitting comfortably in the chair NECK: No JVD; No carotid bruits CARDIAC: RRR, no murmurs, rubs, gallops. Radial pulses 2+ bilaterally  RESPIRATORY:  Clear to auscultation without rales, wheezing or rhonchi. Normal work of breathing on room air  ABDOMEN: Soft, non-tender, non-distended EXTREMITIES:  1+ edema in BLE; No deformity   ASSESSMENT AND PLAN: .    CAD  - Most recent cath from 11/15/2021 showed very mild, nonobstructive CAD with patent mid RCA stent - Patient denies chest pain, DOE  - Continue aspirin 81 mg daily, Crestor 10 mg daily  - Stopping metoprolol  due to low BP   Lower extremity edema -Patient was recently admitted from 8/31 - 9/7 with AKI, right lower extremity wound/cellulitis.  Bilateral lower extremity ultrasound negative for DVT -Most recent echocardiogram from 11/09/2021 showed EF 60-65%, grade 1 diastolic dysfunction, normal RV function - Ordered repeat echocardiogram  - Currently on Lasix 20 mg daily.  Creatinine was 1.42 and potassium was 4.2 on 9/7. Ankle swelling has improved on lasix but she continues to have 1+ edema bilaterally on exam  - Ordered repeat BMP  today on lasix  - Discussed use of compression stockings, low sodium diet, and elevating lower extremities - Given ongoing ankle edema, considered increasing lasix. However, her renal function and BP have both been unstable/low recently. As her lungs are clear and she denies DOE/orthopnea, remain on low dose lasix   HTN  -When patient was discharged on 9/7, lisinopril, hydrochlorothiazide, Imdur were all held  - BP well controlled in clinic today. Reports that her BP is sometimes as low as the 80s/50s in the mornings at home. Usually improves throughout the day, and often improves to the 120s/80s - Patient denies syncope, near syncope. Does feel a little fatigued  - Stop metoprolol to prevent hypotension  - Continue lasix 20 mg daily   HLD  -Continue Crestor 40 mg daily  Lung cancer with Mets to Bone  - Followed by oncology  - On pembrolizumab, bisphosphonate  - Reports pain is well controlled on her new regiment   Dispo: Follow up in 3 months   Signed, Jonita Albee, PA-C

## 2023-04-12 ENCOUNTER — Encounter: Payer: Self-pay | Admitting: Podiatry

## 2023-04-12 ENCOUNTER — Ambulatory Visit (INDEPENDENT_AMBULATORY_CARE_PROVIDER_SITE_OTHER): Payer: Medicare Other | Admitting: Podiatry

## 2023-04-12 DIAGNOSIS — B351 Tinea unguium: Secondary | ICD-10-CM | POA: Diagnosis not present

## 2023-04-12 DIAGNOSIS — I10 Essential (primary) hypertension: Secondary | ICD-10-CM | POA: Diagnosis not present

## 2023-04-12 DIAGNOSIS — E1169 Type 2 diabetes mellitus with other specified complication: Secondary | ICD-10-CM | POA: Diagnosis not present

## 2023-04-12 DIAGNOSIS — C7951 Secondary malignant neoplasm of bone: Secondary | ICD-10-CM | POA: Diagnosis not present

## 2023-04-12 DIAGNOSIS — F419 Anxiety disorder, unspecified: Secondary | ICD-10-CM | POA: Diagnosis not present

## 2023-04-12 DIAGNOSIS — M79676 Pain in unspecified toe(s): Secondary | ICD-10-CM | POA: Diagnosis not present

## 2023-04-13 ENCOUNTER — Other Ambulatory Visit: Payer: Self-pay

## 2023-04-13 ENCOUNTER — Inpatient Hospital Stay: Payer: Medicare Other

## 2023-04-13 ENCOUNTER — Inpatient Hospital Stay: Payer: Medicare Other | Admitting: Nurse Practitioner

## 2023-04-13 DIAGNOSIS — C3492 Malignant neoplasm of unspecified part of left bronchus or lung: Secondary | ICD-10-CM

## 2023-04-15 NOTE — Progress Notes (Signed)
She presents today chief complaint of painful elongated toenails.  Objective: Toenails are long thick yellow dystrophic onychomycotic painful palpation.  Assessment: Pain limb secondary to onychomycosis.  Plan: Debridement toenails 1 through 5 bilateral.

## 2023-04-16 ENCOUNTER — Telehealth: Payer: Self-pay | Admitting: *Deleted

## 2023-04-16 ENCOUNTER — Ambulatory Visit: Payer: Medicare Other | Attending: Cardiology | Admitting: Cardiology

## 2023-04-16 ENCOUNTER — Encounter: Payer: Self-pay | Admitting: Cardiology

## 2023-04-16 VITALS — BP 110/56 | HR 64 | Ht 65.0 in | Wt 168.0 lb

## 2023-04-16 DIAGNOSIS — R600 Localized edema: Secondary | ICD-10-CM

## 2023-04-16 DIAGNOSIS — I2510 Atherosclerotic heart disease of native coronary artery without angina pectoris: Secondary | ICD-10-CM | POA: Diagnosis not present

## 2023-04-16 DIAGNOSIS — I10 Essential (primary) hypertension: Secondary | ICD-10-CM

## 2023-04-16 DIAGNOSIS — C3492 Malignant neoplasm of unspecified part of left bronchus or lung: Secondary | ICD-10-CM

## 2023-04-16 DIAGNOSIS — E785 Hyperlipidemia, unspecified: Secondary | ICD-10-CM

## 2023-04-16 NOTE — Patient Instructions (Signed)
Medication Instructions:  The current medical regimen is effective;  continue present plan and medications as directed. Please refer to the Current Medication list given to you today.  *If you need a refill on your cardiac medications before your next appointment, please call your pharmacy*  Lab Work: BMET TODAY If you have labs (blood work) drawn today and your tests are completely normal, you will receive your results only by:   MyChart Message (if you have MyChart) OR  A paper copy in the mail If you have any lab test that is abnormal or we need to change your treatment, we will call you to review the results.  Testing/Procedures: Your physician has requested that you have an echocardiogram. Echocardiography is a painless test that uses sound waves to create images of your heart. It provides your doctor with information about the size and shape of your heart and how well your heart's chambers and valves are working. This procedure takes approximately one hour. There are no restrictions for this procedure. Please do NOT wear cologne, perfume, aftershave, or lotions (deodorant is allowed). Please arrive 15 minutes prior to your appointment time.   Follow-Up: At [facility], you and your health needs are our priority.  As part of our continuing mission to provide you with exceptional heart care, we have created designated Provider Care Teams.  These Care Teams include your primary Cardiologist (physician) and Advanced Practice Providers (APPs -  Physician Assistants and Nurse Practitioners) who all work together to provide you with the care you need, when you need it.  Your next appointment:   3 month(s)  Provider:   Nicki Guadalajara, MD  or Robet Leu, PA-C

## 2023-04-16 NOTE — Telephone Encounter (Signed)
Called to report she has been discharged from rehab and is now back in her home with a caregiver there 3 pm to 9 am and daughter is there other hours. Hoping to get back to [facility] later this week. They have gotten new script for oxycodone 10 mg every 4 hours prn and Duragesic 12.5 mcg every 72 hours. Needs refill on Duragesic (only has #1 patch left).

## 2023-04-17 ENCOUNTER — Other Ambulatory Visit: Payer: Self-pay | Admitting: Nurse Practitioner

## 2023-04-17 ENCOUNTER — Other Ambulatory Visit: Payer: Self-pay

## 2023-04-17 ENCOUNTER — Telehealth: Payer: Self-pay | Admitting: *Deleted

## 2023-04-17 ENCOUNTER — Encounter: Payer: Self-pay | Admitting: Oncology

## 2023-04-17 DIAGNOSIS — C3492 Malignant neoplasm of unspecified part of left bronchus or lung: Secondary | ICD-10-CM

## 2023-04-17 LAB — BASIC METABOLIC PANEL
BUN/Creatinine Ratio: 30 — ABNORMAL HIGH (ref 12–28)
BUN: 31 mg/dL — ABNORMAL HIGH (ref 8–27)
CO2: 28 mmol/L (ref 20–29)
Calcium: 10.5 mg/dL — ABNORMAL HIGH (ref 8.7–10.3)
Chloride: 96 mmol/L (ref 96–106)
Creatinine, Ser: 1.04 mg/dL — ABNORMAL HIGH (ref 0.57–1.00)
Glucose: 135 mg/dL — ABNORMAL HIGH (ref 70–99)
Potassium: 4.4 mmol/L (ref 3.5–5.2)
Sodium: 139 mmol/L (ref 134–144)
eGFR: 52 mL/min/{1.73_m2} — ABNORMAL LOW (ref >59)

## 2023-04-17 MED ORDER — FENTANYL 12 MCG/HR TD PT72: 1.0000 | MEDICATED_PATCH | TRANSDERMAL | 0 refills | Status: DC

## 2023-04-17 NOTE — Telephone Encounter (Signed)
Call from [facility] for verbal order for home PT 2/week for 8 weeks. OK per Dr. Truett Perna.

## 2023-04-18 ENCOUNTER — Telehealth: Payer: Self-pay | Admitting: *Deleted

## 2023-04-18 MED ORDER — FUROSEMIDE 20 MG PO TABS: 20.0000 mg | ORAL_TABLET | Freq: Every day | ORAL | 0 refills | Status: DC

## 2023-04-18 NOTE — Telephone Encounter (Signed)
Call from OT w/[facility] requesting verbal orders for home OT weekly x 8 weeks. Patient is also asking for a local oncology orthopedic for her hip pain. Does not want to travel to [facility] any longer. Gave verbal order for home OT and will make MD aware of her request. He may want to wait to discuss this with patient at 9/24 OV>

## 2023-04-18 NOTE — Telephone Encounter (Signed)
Per Dr. Truett Perna: Doubtful any other ortho has anything to offer her. Will discuss at next visit. Hope to add zometa if she can get dental clearance. Her daughter reports she sees dentis on 05/15/23, but will call to see if she can be seen sooner.

## 2023-04-19 ENCOUNTER — Telehealth: Payer: Self-pay | Admitting: *Deleted

## 2023-04-19 ENCOUNTER — Encounter (HOSPITAL_BASED_OUTPATIENT_CLINIC_OR_DEPARTMENT_OTHER): Payer: Medicare Other | Attending: General Surgery | Admitting: General Surgery

## 2023-04-19 DIAGNOSIS — C7951 Secondary malignant neoplasm of bone: Secondary | ICD-10-CM | POA: Diagnosis not present

## 2023-04-19 DIAGNOSIS — M1990 Unspecified osteoarthritis, unspecified site: Secondary | ICD-10-CM | POA: Diagnosis not present

## 2023-04-19 DIAGNOSIS — Z87891 Personal history of nicotine dependence: Secondary | ICD-10-CM | POA: Diagnosis not present

## 2023-04-19 DIAGNOSIS — L97812 Non-pressure chronic ulcer of other part of right lower leg with fat layer exposed: Secondary | ICD-10-CM | POA: Diagnosis not present

## 2023-04-19 DIAGNOSIS — E11622 Type 2 diabetes mellitus with other skin ulcer: Secondary | ICD-10-CM | POA: Diagnosis not present

## 2023-04-19 DIAGNOSIS — Z9089 Acquired absence of other organs: Secondary | ICD-10-CM | POA: Diagnosis not present

## 2023-04-19 DIAGNOSIS — I10 Essential (primary) hypertension: Secondary | ICD-10-CM | POA: Insufficient documentation

## 2023-04-19 DIAGNOSIS — E11621 Type 2 diabetes mellitus with foot ulcer: Secondary | ICD-10-CM | POA: Diagnosis present

## 2023-04-19 DIAGNOSIS — W19XXXA Unspecified fall, initial encounter: Secondary | ICD-10-CM | POA: Insufficient documentation

## 2023-04-19 DIAGNOSIS — I2510 Atherosclerotic heart disease of native coronary artery without angina pectoris: Secondary | ICD-10-CM | POA: Diagnosis not present

## 2023-04-19 DIAGNOSIS — Z85118 Personal history of other malignant neoplasm of bronchus and lung: Secondary | ICD-10-CM | POA: Diagnosis not present

## 2023-04-19 NOTE — Progress Notes (Signed)
Tina, [name] ([id number]) [phone].pdf Page 1 of 4 Visit Report for 04/19/2023 Abuse Risk Screen Details Patient Name: Date of Service: [name] 04/19/2023 8:00 A M Medical Record Number: [id number] Patient Account Number: [id number] Date of Birth/Sex: Treating RN: [birth year] (88 y.o. [name] Primary Care Kobyn Kray: Jarome Matin Other Clinician: Referring Ladina Shutters: Treating Gurpreet Mariani/Extender: Marena Chancy in Treatment: 0 Abuse Risk Screen Items Answer ABUSE RISK SCREEN: Has anyone close to you tried to hurt or harm you recentlyo No Do you feel uncomfortable with anyone in your familyo No Has anyone forced you do things that you didnt want to doo No Electronic Signature(s) Signed: 04/19/2023 4:20:28 PM By: Brenton Grills Entered By: Brenton Grills on 04/19/2023 08:07:31 -------------------------------------------------------------------------------- Activities of Daily Living Details Patient Name: Date of Service: [name] 04/19/2023 8:00 A M Medical Record Number: [id number] Patient Account Number: [id number] Date of Birth/Sex: Treating RN: [birth year] (88 y.o. [name] Primary Care Adrienne Delay: Jarome Matin Other Clinician: Referring Agatha Duplechain: Treating Shawnmichael Parenteau/Extender: Marena Chancy in Treatment: 0 Activities of Daily Living Items Answer Activities of Daily Living (Please select one for each item) Drive Automobile Need Assistance T Medications ake Completely Able Use T elephone Completely Able Care for Appearance Completely Able Use T oilet Completely Able Bath / Shower Completely Able Dress Self Completely Able Feed Self Completely Able Walk Completely Able Get In / Out Bed Completely Able Housework Completely Able Prepare Meals Completely Able Handle Money Completely Able Shop for Self Completely Able Electronic  Signature(s) Signed: 04/19/2023 4:20:28 PM By: Brenton Grills Entered By: Brenton Grills on 04/19/2023 08:08:11 [facility] ([id number]) [id number] Page 2 of 4 -------------------------------------------------------------------------------- Education Screening Details Patient Name: Date of Service: [name] 04/19/2023 8:00 A M Medical Record Number: [id number] Patient Account Number: [id number] Date of Birth/Sex: Treating RN: [birth year] (88 y.o. [name] Primary Care Lavilla Delamora: Jarome Matin Other Clinician: Referring Di Jasmer: Treating Vineeth Fell/Extender: Marena Chancy in Treatment: 0 Learning Preferences/Education Level/Primary Language Highest Education Level: College or Above Preferred Language: English Cognitive Barrier Language Barrier: No Translator Needed: No Memory Deficit: No Emotional Barrier: No Cultural/Religious Beliefs Affecting Medical Care: No Physical Barrier Impaired Vision: Yes Glasses Impaired Hearing: No Decreased Hand dexterity: No Knowledge/Comprehension Knowledge Level: High Comprehension Level: High Ability to understand written instructions: High Ability to understand verbal instructions: High Motivation Anxiety Level: Calm Cooperation: Cooperative Education Importance: Acknowledges Need Interest in Health Problems: Asks Questions Perception: Coherent Willingness to Engage in Self-Management High Activities: Readiness to Engage in Self-Management High Activities: Electronic Signature(s) Signed: 04/19/2023 4:20:28 PM By: Brenton Grills Entered By: Brenton Grills on 04/19/2023 08:11:47 -------------------------------------------------------------------------------- Fall Risk Assessment Details Patient Name: Date of Service: Tina Meyer C. 04/19/2023 8:00 A M Medical Record Number: [id number] Patient Account Number: [id number] Date of  Birth/Sex: Treating RN: 1935/06/24 (88 y.o. [name] Primary Care Brandn Mcgath: Jarome Matin Other Clinician: Referring Azar South: Treating Leeona Mccardle/Extender: Marena Chancy in Treatment: 0 Fall Risk Assessment Items Have you had 2 or more falls in the last 12 monthso 0 No Have you had any fall that resulted in injury in the last 12 monthso 0 No FALLS RISK SCREEN DOMINO, FONTANA C ([id number]) [phone] [id number] Page 3 of 4 History of falling - immediate or within 3 months 0 No Secondary diagnosis (Do you have 2 or more medical diagnoseso) 0 No Ambulatory  aid None/bed rest/wheelchair/nurse 0 No Crutches/cane/walker 0 No Furniture 0 No Intravenous therapy Access/Saline/Heparin Lock 0 No Gait/Transferring Normal/ bed rest/ wheelchair 0 No Weak (short steps with or without shuffle, stooped but able to lift head while walking, may seek 0 No support from furniture) Impaired (short steps with shuffle, may have difficulty arising from chair, head down, impaired 0 No balance) Mental Status Oriented to own ability 0 No Electronic Signature(s) Signed: 04/19/2023 4:20:28 PM By: Brenton Grills Entered By: Brenton Grills on 04/19/2023 08:12:09 -------------------------------------------------------------------------------- Foot Assessment Details Patient Name: Date of Service: Tina Meyer C. 04/19/2023 8:00 A M Medical Record Number: [id number] Patient Account Number: [id number] Date of Birth/Sex: Treating RN: 01-09-1935 (88 y.o. [name] Primary Care Petrina Melby: Jarome Matin Other Clinician: Referring Taishawn Smaldone: Treating Taler Kushner/Extender: Marena Chancy in Treatment: 0 Foot Assessment Items Site Locations + = Sensation present, - = Sensation absent, C = Callus, U = Ulcer R = Redness, W = Warmth, M = Maceration, PU = Pre-ulcerative lesion F = Fissure, S = Swelling, D =  Dryness Assessment Right: Left: Other Deformity: No No Prior Foot Ulcer: No No Prior Amputation: No No Charcot Joint: No No Ambulatory Status: Ambulatory With Help Assistance Device: Walker GaitLILITH, [name] ([id number]) [phone].pdf Page 4 of 4 Electronic Signature(s) Signed: 04/19/2023 4:20:28 PM By: Brenton Grills Entered By: Brenton Grills on 04/19/2023 08:14:40 -------------------------------------------------------------------------------- Nutrition Risk Screening Details Patient Name: Date of Service: [name] 04/19/2023 8:00 A M Medical Record Number: [id number] Patient Account Number: [id number] Date of Birth/Sex: Treating RN: [birth year] (88 y.o. [name] Primary Care Mikena Masoner: Jarome Matin Other Clinician: Referring Shawnte Winton: Treating Dolce Sylvia/Extender: Marena Chancy in Treatment: 0 Height (in): 65 Weight (lbs): 165 Body Mass Index (BMI): 27.5 Nutrition Risk Screening Items Score Screening NUTRITION RISK SCREEN: I have an illness or condition that made me change the kind and/or amount of food I eat 0 No I eat fewer than two meals per day 0 No I eat few fruits and vegetables, or milk products 0 No I have three or more drinks of beer, liquor or wine almost every day 0 No I have tooth or mouth problems that make it hard for me to eat 0 No I don't always have enough money to buy the food I need 0 No I eat alone most of the time 0 No I take three or more different prescribed or over-the-counter drugs a day 0 No Without wanting to, I have lost or gained 10 pounds in the last six months 0 No I am not always physically able to shop, cook and/or feed myself 0 No Nutrition Protocols Good Risk Protocol Moderate Risk Protocol High Risk Proctocol Risk Level: Good Risk Score: 0 Electronic Signature(s) Signed: 04/19/2023 4:20:28 PM By: Brenton Grills Entered By: Brenton Grills on 04/19/2023 08:12:21

## 2023-04-19 NOTE — Progress Notes (Signed)
been seen at the hospital within the last three years: Yes Total Score: 110 Level Of Care: New/Established - Level 3 Electronic Signature(s) Signed: 04/19/2023 4:20:28 PM By: Brenton Grills Entered By: Brenton Grills on 04/19/2023 09:02:33 [facility] ([id number]) [id number] Page 3 of 8 -------------------------------------------------------------------------------- Compression Therapy Details Patient Name: Date of Service: [name] 04/19/2023 8:00 A M Medical Record Number: [id number] Patient Account Number: [id number] Date of Birth/Sex: Treating RN: 1935-04-07 (88 y.o. [name] Primary Care Kyree Fedorko: Jarome Matin Other Clinician: Referring Lonnell Chaput: Treating Fatisha Rabalais/Extender: Marena Chancy in Treatment: 0 Compression Therapy Performed for Wound Assessment: Wound #1 Right,Lateral Lower Leg Performed By: Clinician Brenton Grills, RN Compression Type: Three Layer Pre Treatment ABI: 1 Post Procedure Diagnosis Same as Pre-procedure Electronic Signature(s) Signed: 04/19/2023 4:20:28 PM By: Brenton Grills Entered By: Brenton Grills on 04/19/2023 09:00:20 -------------------------------------------------------------------------------- Encounter Discharge Information Details Patient Name: Date of Service: [name]. 04/19/2023 8:00 A M Medical Record Number: [id number] Patient Account Number: [id number] Date of Birth/Sex: Treating RN: [birth year] (88 y.o. [name] Primary Care Marvette Schamp: Jarome Matin Other Clinician: Referring Trezure Cronk: Treating  Eliab Closson/Extender: Marena Chancy in Treatment: 0 Encounter Discharge Information Items Post Procedure Vitals Discharge Condition: Stable Temperature (F): 97.9 Ambulatory Status: Walker Pulse (bpm): 84 Discharge Destination: Home Respiratory Rate (breaths/min): 18 Transportation: Private Auto Blood Pressure (mmHg): 145/70 Accompanied By: self Schedule Follow-up Appointment: Yes Clinical Summary of Care: Patient Declined Electronic Signature(s) Signed: 04/19/2023 4:20:28 PM By: Brenton Grills Entered By: Brenton Grills on 04/19/2023 09:03:48 -------------------------------------------------------------------------------- Lower Extremity Assessment Details Patient Name: Date of Service: Tina Meyer C. 04/19/2023 8:00 A M Medical Record Number: [id number] Patient Account Number: [id number] Date of Birth/Sex: Treating RN: January 02, 1935 (88 y.o. [name] Primary Care Damica Gravlin: Jarome Matin Other Clinician: Referring Dhairya Corales: Treating Jayni Prescher/Extender: Marena Chancy in Treatment: 0 Edema Assessment Assessed: Kyra Searles: No] Franne Forts: No] [Left: Edema] Franne Forts: :] S[LeftJuleen Starr ([id number])] [ACZYS: [id number] Page 4 of 8] Calf Left: Right: Point of Measurement: From Medial Instep 25.2 cm 23.2 cm Ankle Left: Right: Point of Measurement: From Medial Instep 40 cm 38.2 cm Knee To Floor Left: Right: From Medial Instep 44 cm 44 cm Vascular Assessment Pulses: Dorsalis Pedis Palpable: [Left:Yes] [Right:Yes] Extremity colors, hair growth, and conditions: Extremity Color: [Left:Normal] [Right:Normal] Hair Growth on Extremity: [Left:No] [Right:No] Temperature of Extremity: [Left:Warm] [Right:Warm] Capillary Refill: [Left:< 3 seconds] [Right:< 3 seconds] Blood Pressure: Brachial: [Left:145] [Right:145] Ankle: [Left:Dorsalis Pedis: 147 1.01] [Right:Dorsalis Pedis: 150  1.03] Toe Nail Assessment Left: Right: Thick: No No Discolored: No No Deformed: No No Improper Length and Hygiene: No No Electronic Signature(s) Signed: 04/19/2023 4:20:28 PM By: Brenton Grills Entered By: Brenton Grills on 04/19/2023 08:22:00 -------------------------------------------------------------------------------- Multi Wound Chart Details Patient Name: Date of Service: Tina Meyer C. 04/19/2023 8:00 A M Medical Record Number: [id number] Patient Account Number: [id number] Date of Birth/Sex: Treating RN: 1934/08/22 (88 y.o. F) Primary Care Fradel Baldonado: Jarome Matin Other Clinician: Referring Aigner Horseman: Treating Armonie Staten/Extender: Marena Chancy in Treatment: 0 Vital Signs Height(in): 65 Capillary Blood Glucose(mg/dl): 160 Weight(lbs): 737 Pulse(bpm): 80 Body Mass Index(BMI): 27.5 Blood Pressure(mmHg): 145/80 Temperature(F): 97.9 Respiratory Rate(breaths/min): 18 [1:Photos:] [N/A:N/A] Right, Lateral Lower Leg N/A N/A Wound Location: Trauma N/A N/A Wounding Event: Trauma, Other N/A N/A Primary Etiology: Coronary Artery Disease, N/A N/A Comorbid History: Hypertension, Type II Diabetes, Osteoarthritis 03/29/2023 N/A N/A Date Acquired: 0  been seen at the hospital within the last three years: Yes Total Score: 110 Level Of Care: New/Established - Level 3 Electronic Signature(s) Signed: 04/19/2023 4:20:28 PM By: Brenton Grills Entered By: Brenton Grills on 04/19/2023 09:02:33 [facility] ([id number]) [id number] Page 3 of 8 -------------------------------------------------------------------------------- Compression Therapy Details Patient Name: Date of Service: [name] 04/19/2023 8:00 A M Medical Record Number: [id number] Patient Account Number: [id number] Date of Birth/Sex: Treating RN: 1935-04-07 (88 y.o. [name] Primary Care Kyree Fedorko: Jarome Matin Other Clinician: Referring Lonnell Chaput: Treating Fatisha Rabalais/Extender: Marena Chancy in Treatment: 0 Compression Therapy Performed for Wound Assessment: Wound #1 Right,Lateral Lower Leg Performed By: Clinician Brenton Grills, RN Compression Type: Three Layer Pre Treatment ABI: 1 Post Procedure Diagnosis Same as Pre-procedure Electronic Signature(s) Signed: 04/19/2023 4:20:28 PM By: Brenton Grills Entered By: Brenton Grills on 04/19/2023 09:00:20 -------------------------------------------------------------------------------- Encounter Discharge Information Details Patient Name: Date of Service: [name]. 04/19/2023 8:00 A M Medical Record Number: [id number] Patient Account Number: [id number] Date of Birth/Sex: Treating RN: [birth year] (88 y.o. [name] Primary Care Marvette Schamp: Jarome Matin Other Clinician: Referring Trezure Cronk: Treating  Eliab Closson/Extender: Marena Chancy in Treatment: 0 Encounter Discharge Information Items Post Procedure Vitals Discharge Condition: Stable Temperature (F): 97.9 Ambulatory Status: Walker Pulse (bpm): 84 Discharge Destination: Home Respiratory Rate (breaths/min): 18 Transportation: Private Auto Blood Pressure (mmHg): 145/70 Accompanied By: self Schedule Follow-up Appointment: Yes Clinical Summary of Care: Patient Declined Electronic Signature(s) Signed: 04/19/2023 4:20:28 PM By: Brenton Grills Entered By: Brenton Grills on 04/19/2023 09:03:48 -------------------------------------------------------------------------------- Lower Extremity Assessment Details Patient Name: Date of Service: Tina Meyer C. 04/19/2023 8:00 A M Medical Record Number: [id number] Patient Account Number: [id number] Date of Birth/Sex: Treating RN: January 02, 1935 (88 y.o. [name] Primary Care Damica Gravlin: Jarome Matin Other Clinician: Referring Dhairya Corales: Treating Jayni Prescher/Extender: Marena Chancy in Treatment: 0 Edema Assessment Assessed: Kyra Searles: No] Franne Forts: No] [Left: Edema] Franne Forts: :] S[LeftJuleen Starr ([id number])] [ACZYS: [id number] Page 4 of 8] Calf Left: Right: Point of Measurement: From Medial Instep 25.2 cm 23.2 cm Ankle Left: Right: Point of Measurement: From Medial Instep 40 cm 38.2 cm Knee To Floor Left: Right: From Medial Instep 44 cm 44 cm Vascular Assessment Pulses: Dorsalis Pedis Palpable: [Left:Yes] [Right:Yes] Extremity colors, hair growth, and conditions: Extremity Color: [Left:Normal] [Right:Normal] Hair Growth on Extremity: [Left:No] [Right:No] Temperature of Extremity: [Left:Warm] [Right:Warm] Capillary Refill: [Left:< 3 seconds] [Right:< 3 seconds] Blood Pressure: Brachial: [Left:145] [Right:145] Ankle: [Left:Dorsalis Pedis: 147 1.01] [Right:Dorsalis Pedis: 150  1.03] Toe Nail Assessment Left: Right: Thick: No No Discolored: No No Deformed: No No Improper Length and Hygiene: No No Electronic Signature(s) Signed: 04/19/2023 4:20:28 PM By: Brenton Grills Entered By: Brenton Grills on 04/19/2023 08:22:00 -------------------------------------------------------------------------------- Multi Wound Chart Details Patient Name: Date of Service: Tina Meyer C. 04/19/2023 8:00 A M Medical Record Number: [id number] Patient Account Number: [id number] Date of Birth/Sex: Treating RN: 1934/08/22 (88 y.o. F) Primary Care Fradel Baldonado: Jarome Matin Other Clinician: Referring Aigner Horseman: Treating Armonie Staten/Extender: Marena Chancy in Treatment: 0 Vital Signs Height(in): 65 Capillary Blood Glucose(mg/dl): 160 Weight(lbs): 737 Pulse(bpm): 80 Body Mass Index(BMI): 27.5 Blood Pressure(mmHg): 145/80 Temperature(F): 97.9 Respiratory Rate(breaths/min): 18 [1:Photos:] [N/A:N/A] Right, Lateral Lower Leg N/A N/A Wound Location: Trauma N/A N/A Wounding Event: Trauma, Other N/A N/A Primary Etiology: Coronary Artery Disease, N/A N/A Comorbid History: Hypertension, Type II Diabetes, Osteoarthritis 03/29/2023 N/A N/A Date Acquired: 0  Brenton Grills on 04/19/2023 08:30:06 -------------------------------------------------------------------------------- Wound Assessment Details Patient Name: Date of Service: [name] 04/19/2023 8:00 A M Medical Record Number: [id number] Patient Account Number: [id number] Date of Birth/Sex: Treating RN: [birth year] (88 y.o. [name] Primary Care Uriah Trueba: Jarome Matin Other Clinician: Referring Sasha Rueth: Treating Margalit Leece/Extender: Marena Chancy in Treatment: 0 Wound Status Wound Number: 1 Primary Trauma, Other Etiology: Wound Location: Right, Lateral Lower Leg Wound Status: Open Wounding Event: Trauma Comorbid Coronary Artery Disease, Hypertension, Type II Diabetes, Date Acquired: 03/29/2023 History: Osteoarthritis Weeks Of Treatment: 0 Clustered Wound: No Photos Wound Measurements Length: (cm) 2 Width: (cm) 1. HENRY, TRIAL ([id number]) Depth: (cm) 0.1 Area: (cm) 2. Volume: (cm) 0. % Reduction in Area: 3 % Reduction in Volume: [id number] Page 8 of 8 Epithelialization: Medium (34-66%) 042 Tunneling: No 204 Undermining: No Wound Description Classification: Full Thickness Without Exposed Suppor Wound Margin: Distinct, outline attached Exudate Amount: Medium Exudate Type: Serosanguineous Exudate Color: red, brown t Structures Foul Odor After Cleansing: No Slough/Fibrino Yes Wound Bed Granulation Amount: Medium (34-66%) Exposed Structure Granulation Quality: Red, Pink Fat Layer (Subcutaneous Tissue) Exposed: Yes Necrotic Amount: Medium (34-66%) Necrotic Quality: Eschar Periwound Skin Texture Texture Color No Abnormalities Noted: No No Abnormalities Noted: No Scarring: Yes Hemosiderin Staining: Yes Moisture Temperature / Pain No Abnormalities Noted: No Temperature: No  Abnormality Electronic Signature(s) Signed: 04/19/2023 4:20:28 PM By: Brenton Grills Entered By: Brenton Grills on 04/19/2023 08:26:40 -------------------------------------------------------------------------------- Vitals Details Patient Name: Date of Service: Tina Meyer C. 04/19/2023 8:00 A M Medical Record Number: [id number] Patient Account Number: [id number] Date of Birth/Sex: Treating RN: [birth year] (88 y.o. [name] Primary Care Elizabethanne Lusher: Jarome Matin Other Clinician: Referring Rayna Brenner: Treating Sofhia Ulibarri/Extender: Marena Chancy in Treatment: 0 Vital Signs Time Taken: 08:00 Temperature (F): 97.9 Height (in): 65 Pulse (bpm): 80 Weight (lbs): 165 Respiratory Rate (breaths/min): 18 Body Mass Index (BMI): 27.5 Blood Pressure (mmHg): 145/80 Capillary Blood Glucose (mg/dl): 433 Reference Range: 80 - 120 mg / dl Electronic Signature(s) Signed: 04/19/2023 4:20:28 PM By: Brenton Grills Entered By: Brenton Grills on 04/19/2023 08:32:02  been seen at the hospital within the last three years: Yes Total Score: 110 Level Of Care: New/Established - Level 3 Electronic Signature(s) Signed: 04/19/2023 4:20:28 PM By: Brenton Grills Entered By: Brenton Grills on 04/19/2023 09:02:33 [facility] ([id number]) [id number] Page 3 of 8 -------------------------------------------------------------------------------- Compression Therapy Details Patient Name: Date of Service: [name] 04/19/2023 8:00 A M Medical Record Number: [id number] Patient Account Number: [id number] Date of Birth/Sex: Treating RN: 1935-04-07 (88 y.o. [name] Primary Care Kyree Fedorko: Jarome Matin Other Clinician: Referring Lonnell Chaput: Treating Fatisha Rabalais/Extender: Marena Chancy in Treatment: 0 Compression Therapy Performed for Wound Assessment: Wound #1 Right,Lateral Lower Leg Performed By: Clinician Brenton Grills, RN Compression Type: Three Layer Pre Treatment ABI: 1 Post Procedure Diagnosis Same as Pre-procedure Electronic Signature(s) Signed: 04/19/2023 4:20:28 PM By: Brenton Grills Entered By: Brenton Grills on 04/19/2023 09:00:20 -------------------------------------------------------------------------------- Encounter Discharge Information Details Patient Name: Date of Service: [name]. 04/19/2023 8:00 A M Medical Record Number: [id number] Patient Account Number: [id number] Date of Birth/Sex: Treating RN: [birth year] (88 y.o. [name] Primary Care Marvette Schamp: Jarome Matin Other Clinician: Referring Trezure Cronk: Treating  Eliab Closson/Extender: Marena Chancy in Treatment: 0 Encounter Discharge Information Items Post Procedure Vitals Discharge Condition: Stable Temperature (F): 97.9 Ambulatory Status: Walker Pulse (bpm): 84 Discharge Destination: Home Respiratory Rate (breaths/min): 18 Transportation: Private Auto Blood Pressure (mmHg): 145/70 Accompanied By: self Schedule Follow-up Appointment: Yes Clinical Summary of Care: Patient Declined Electronic Signature(s) Signed: 04/19/2023 4:20:28 PM By: Brenton Grills Entered By: Brenton Grills on 04/19/2023 09:03:48 -------------------------------------------------------------------------------- Lower Extremity Assessment Details Patient Name: Date of Service: Tina Meyer C. 04/19/2023 8:00 A M Medical Record Number: [id number] Patient Account Number: [id number] Date of Birth/Sex: Treating RN: January 02, 1935 (88 y.o. [name] Primary Care Damica Gravlin: Jarome Matin Other Clinician: Referring Dhairya Corales: Treating Jayni Prescher/Extender: Marena Chancy in Treatment: 0 Edema Assessment Assessed: Kyra Searles: No] Franne Forts: No] [Left: Edema] Franne Forts: :] S[LeftJuleen Starr ([id number])] [ACZYS: [id number] Page 4 of 8] Calf Left: Right: Point of Measurement: From Medial Instep 25.2 cm 23.2 cm Ankle Left: Right: Point of Measurement: From Medial Instep 40 cm 38.2 cm Knee To Floor Left: Right: From Medial Instep 44 cm 44 cm Vascular Assessment Pulses: Dorsalis Pedis Palpable: [Left:Yes] [Right:Yes] Extremity colors, hair growth, and conditions: Extremity Color: [Left:Normal] [Right:Normal] Hair Growth on Extremity: [Left:No] [Right:No] Temperature of Extremity: [Left:Warm] [Right:Warm] Capillary Refill: [Left:< 3 seconds] [Right:< 3 seconds] Blood Pressure: Brachial: [Left:145] [Right:145] Ankle: [Left:Dorsalis Pedis: 147 1.01] [Right:Dorsalis Pedis: 150  1.03] Toe Nail Assessment Left: Right: Thick: No No Discolored: No No Deformed: No No Improper Length and Hygiene: No No Electronic Signature(s) Signed: 04/19/2023 4:20:28 PM By: Brenton Grills Entered By: Brenton Grills on 04/19/2023 08:22:00 -------------------------------------------------------------------------------- Multi Wound Chart Details Patient Name: Date of Service: Tina Meyer C. 04/19/2023 8:00 A M Medical Record Number: [id number] Patient Account Number: [id number] Date of Birth/Sex: Treating RN: 1934/08/22 (88 y.o. F) Primary Care Fradel Baldonado: Jarome Matin Other Clinician: Referring Aigner Horseman: Treating Armonie Staten/Extender: Marena Chancy in Treatment: 0 Vital Signs Height(in): 65 Capillary Blood Glucose(mg/dl): 160 Weight(lbs): 737 Pulse(bpm): 80 Body Mass Index(BMI): 27.5 Blood Pressure(mmHg): 145/80 Temperature(F): 97.9 Respiratory Rate(breaths/min): 18 [1:Photos:] [N/A:N/A] Right, Lateral Lower Leg N/A N/A Wound Location: Trauma N/A N/A Wounding Event: Trauma, Other N/A N/A Primary Etiology: Coronary Artery Disease, N/A N/A Comorbid History: Hypertension, Type II Diabetes, Osteoarthritis 03/29/2023 N/A N/A Date Acquired: 0

## 2023-04-19 NOTE — Progress Notes (Addendum)
at 08:44, prior to the start of the procedure. A Minimum amount of bleeding was controlled with Pressure. The procedure was tolerated well. Post Debridement Measurements: 2cm length x 1.3cm width x 0.1cm depth; 0.204cm^3 volume. Character of Wound/Ulcer Post Debridement is stable. Severity of Tissue Post Debridement is: Fat layer exposed. Post procedure Diagnosis Wound #1: Same as Pre-Procedure Pre-procedure diagnosis of Wound #1 is a Diabetic Wound/Ulcer of the Lower  Extremity located on the Right,Lateral Lower Leg . There was a Three Layer Compression Therapy Procedure with a pre-treatment ABI of 1 by Brenton Grills, RN. Post procedure Diagnosis Wound #1: Same as Pre-Procedure [name] ([id number]) [phone].pdf Page 6 of 8 Plan Follow-up Appointments: Return Appointment in 1 week. - Dr. Lady Gary - please make appointment Anesthetic: (In clinic) Topical Lidocaine 4% applied to wound bed Bathing/ Shower/ Hygiene: May shower with protection but do not get wound dressing(s) wet. Protect dressing(s) with water repellant cover (for example, large plastic bag) or a cast cover and may then take shower. Edema Control - Lymphedema / SCD / Other: Elevate legs to the level of the heart or above for 30 minutes daily and/or when sitting for 3-4 times a day throughout the day. Avoid standing for long periods of time. Exercise regularly Additional Orders / Instructions: Follow Nutritious Diet - Increase protein intake or maintain at 70-100g/day. WOUND #1: - Lower Leg Wound Laterality: Right, Lateral Prim Dressing: Maxorb Extra Ag+ Alginate Dressing, 4x4.75 (in/in) ary Discharge Instructions: Apply to wound bed as instructed Secondary Dressing: Woven Gauze Sponge, Non-Sterile 4x4 in Discharge Instructions: Apply over primary dressing as directed. Com pression Wrap: Urgo K2 Lite, (equivalent to a 3 layer) two layer compression system, regular Discharge Instructions: Apply Urgo K2 Lite as directed (alternative to 3 layer compression). 04/19/2023: On her right lower anterior tibial surface, there is a triangular-shaped wound covered with black eschar. Underneath, the fat layer is exposed. There is no periwound erythema or induration. No malodor or purulent drainage to suggest infection. I used a curette to debride eschar and slough from her wound. Will apply silver alginate and Urgo lite 3 layer compression equivalent. We discussed  the importance of leg elevation as much as possible throughout the day as well as adequate protein intake. She already drinks 1-2 Premier protein shakes per day and is therefore well ahead of the game compared to many of our other patients. She will follow-up in 1 week. Electronic Signature(s) Signed: 04/20/2023 12:34:21 PM By: Shawn Stall RN, BSN Signed: 04/23/2023 10:32:01 AM By: [name] Previous Signature: 04/19/2023 8:58:31 AM Version By: [name] Entered By: Shawn Stall on 04/20/2023 09:33:18 -------------------------------------------------------------------------------- HxROS Details Patient Name: Date of Service: [name]. 04/19/2023 8:00 A M Medical Record Number: [id number] Patient Account Number: [id number] Date of Birth/Sex: Treating RN: [birth year] (88 y.o. [name] Primary Care Provider: Jarome Matin Other Clinician: Referring Provider: Treating Provider/Extender: Marena Chancy in Treatment: 0 Information Obtained From Chart Hematologic/Lymphatic Cardiovascular Medical History: Positive for: Coronary Artery Disease; Hypertension Endocrine Medical History: Positive for: Type II Diabetes - states "prediabetic" Musculoskeletal [name] ([id number]) [phone].pdf Page 7 of 8 Medical History: Positive for: Osteoarthritis Past Medical History Notes: Osteoporosis Neurologic Medical History: Past Medical History Notes: Aneurysm Oncologic Medical History: Past Medical History Notes: Lung CA with metastases to bone Immunizations Pneumococcal Vaccine: Received Pneumococcal Vaccination: No Implantable Devices No devices added Hospitalization / Surgery History Type of Hospitalization/Surgery left heart cath laminectomy  Treating RN: [birth year] (88 y.o. F) Primary Care Provider: Jarome Matin Other Clinician: Referring Provider: Treating Provider/Extender: Marena Chancy in Treatment: 0 Constitutional Slightly hypertensive. . . . No acute distress. Respiratory Normal work of breathing on room air. Cardiovascular 1+ pitting edema bilaterally. Notes 04/19/2023: On her right lower anterior tibial surface, there is a triangular-shaped wound covered with black eschar. Underneath, the fat layer is exposed. There is no periwound erythema or induration. No malodor or purulent drainage to suggest infection. [name], [name] ([id number]) [id number] Page 3 of 8 Electronic Signature(s) Signed: 04/19/2023 8:56:44 AM By: [name] Entered By: [name] on 04/19/2023 05:56:44 -------------------------------------------------------------------------------- Physician Orders Details Patient Name: Date of Service: [name]. 04/19/2023 8:00 A M Medical Record Number: [id number] Patient Account Number: [id number] Date of Birth/Sex: Treating RN: 07-19-1935 (88 y.o. [name] Primary Care Provider: Jarome Matin Other Clinician: Referring Provider: Treating Provider/Extender: Marena Chancy in Treatment: 0 The following information was scribed by: Brenton Grills The information was scribed for: [name] Verbal / Phone Orders: No Diagnosis Coding ICD-10 Coding Code Description [phone] Non-pressure chronic ulcer of other part of right lower leg with fat layer exposed E11.622 Type 2 diabetes mellitus with other skin ulcer Follow-up Appointments ppointment in 1 week. - Dr. Lady Gary - please make appointment Return A Anesthetic (In clinic) Topical Lidocaine 4% applied to wound bed Bathing/ Shower/ Hygiene May shower with protection but do not get wound dressing(s) wet. Protect dressing(s) with water repellant cover (for example, large plastic bag) or a cast cover and may then take shower. Edema Control - Lymphedema / SCD / Other Bilateral Lower Extremities Elevate legs to the level of the heart or above for 30 minutes daily and/or when sitting for 3-4 times a day throughout the day. Avoid standing for long periods of time. Exercise regularly Additional Orders / Instructions Follow Nutritious Diet - Increase protein intake or maintain at 70-100g/day. Wound Treatment Wound #1 - Lower Leg Wound Laterality: Right, Lateral Prim Dressing: Maxorb Extra Ag+ Alginate Dressing, 4x4.75 (in/in) ary Discharge Instructions: Apply to wound bed as instructed Secondary Dressing: Woven Gauze Sponge, Non-Sterile 4x4 in Discharge Instructions: Apply over primary dressing as directed. Compression Wrap: Urgo K2 Lite, (equivalent to a 3 layer) two layer compression system, regular Discharge Instructions: Apply Urgo K2 Lite as directed (alternative to 3 layer compression). Electronic Signature(s) Signed: 04/19/2023 11:42:07 AM By: [name] Entered By: [name] on 04/19/2023 05:56:55 [facility] ([id number]) [id number] Page 4 of  8 -------------------------------------------------------------------------------- Problem List Details Patient Name: Date of Service: [name] 04/19/2023 8:00 A M Medical Record Number: [id number] Patient Account Number: [id number] Date of Birth/Sex: Treating RN: 31-May-1935 (88 y.o. F) Primary Care Provider: Jarome Matin Other Clinician: Referring Provider: Treating Provider/Extender: Marena Chancy in Treatment: 0 Active Problems ICD-10 Encounter Code Description Active Date MDM Diagnosis [phone] Non-pressure chronic ulcer of other part of right lower leg with fat layer 04/19/2023 No Yes exposed E11.622 Type 2 diabetes mellitus with other skin ulcer 04/19/2023 No Yes Inactive Problems Resolved Problems Electronic Signature(s) Signed: 04/19/2023 7:58:43 AM By: [name] Entered By: [name] on 04/19/2023 04:58:43 -------------------------------------------------------------------------------- Progress Note Details Patient Name: Date of Service: [name]. 04/19/2023 8:00 A M Medical Record Number: [id number] Patient Account Number: [id number] Date of Birth/Sex: Treating RN: 08/08/1934 (88 y.o. F) Primary Care Provider:  NAKEETA, WEINDEL ([id number]) [id number] Page 1 of 8 Visit Report for 04/19/2023 Chief Complaint Document Details Patient Name: Date of Service: [name] 04/19/2023 8:00 A M Medical Record Number: [id number] Patient Account Number: [id number] Date of Birth/Sex: Treating RN: June 02, 1935 (88 y.o. F) Primary Care Provider: Jarome Matin Other Clinician: Referring Provider: Treating Provider/Extender: Marena Chancy in Treatment: 0 Information Obtained from: Patient Chief Complaint Patient seen for complaints of Non-Healing Wound. Electronic Signature(s) Signed: 04/19/2023 8:55:14 AM By: [name] Previous Signature: 04/19/2023 7:58:55 AM Version By: [name] Entered By: [name] on 04/19/2023 05:55:14 -------------------------------------------------------------------------------- Debridement Details Patient Name: Date of Service: [name]. 04/19/2023 8:00 A M Medical Record Number: [id number] Patient Account Number: [id number] Date of Birth/Sex: Treating RN: [birth year] (88 y.o. [name] Primary Care Provider: Jarome Matin Other Clinician: Referring Provider: Treating Provider/Extender: Marena Chancy in Treatment: 0 Debridement Performed for Assessment: Wound #1 Right,Lateral Lower Leg Performed By: Physician [name], MD The following information was scribed by: Brenton Grills The information was scribed for: [name] Debridement Type: Debridement Severity of Tissue Pre Debridement: Fat layer exposed Level of Consciousness (Pre-procedure): Awake and Alert Pre-procedure Verification/Time Out Yes - 08:44 Taken: Start Time: 08:45 Pain Control: Lidocaine 4% Topical Solution Percent of Wound Bed Debrided: 100% T Area Debrided (cm): otal 2.04 Tissue and other material debrided: Non-Viable, Eschar,  Slough, Slough Level: Non-Viable Tissue Debridement Description: Selective/Open Wound Instrument: Curette Bleeding: Minimum Hemostasis Achieved: Pressure Response to Treatment: Procedure was tolerated well Level of Consciousness (Post- Awake and Alert procedure): Post Debridement Measurements of Total Wound Length: (cm) 2 Width: (cm) 1.3 Depth: (cm) 0.1 Volume: (cm) 0.204 Character of Wound/Ulcer Post DebridementJAZIRAH, TRIANO ([id number]) [phone].pdf Page 2 of 8 Severity of Tissue Post Debridement: Fat layer exposed Post Procedure Diagnosis Same as Pre-procedure Electronic Signature(s) Signed: 04/19/2023 11:42:07 AM By: [name] Signed: 04/19/2023 4:20:28 PM By: Brenton Grills Entered By: Brenton Grills on 04/19/2023 05:49:33 -------------------------------------------------------------------------------- HPI Details Patient Name: Date of Service: [name]. 04/19/2023 8:00 A M Medical Record Number: [id number] Patient Account Number: [id number] Date of Birth/Sex: Treating RN: 09-Jul-1935 (88 y.o. 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 She was recently discharged from the hospital where she was admitted after a fall. As part of the injuries she sustained in the fall, she struck the anterior tibial surface of her right lower leg. While in the hospital wound care was consulted and they recommended application of Mepitel and mupirocin. She was also discharged on the wound course of Augmentin due to concern for cellulitis  associated with the wound. Upon discharge from the hospital, she was referred to the wound care center for further evaluation and management. Electronic Signature(s) Signed: 04/19/2023 8:55:42 AM By: [name] Previous Signature: 04/19/2023 8:02:51 AM Version By: [name] Entered By: [name] on 04/19/2023 05:55:41 -------------------------------------------------------------------------------- Physical Exam Details Patient Name: Date of Service: [name]. 04/19/2023 8:00 A M Medical Record Number: [id number] Patient Account Number: [id number] Date of Birth/Sex:  Treating RN: [birth year] (88 y.o. F) Primary Care Provider: Jarome Matin Other Clinician: Referring Provider: Treating Provider/Extender: Marena Chancy in Treatment: 0 Constitutional Slightly hypertensive. . . . No acute distress. Respiratory Normal work of breathing on room air. Cardiovascular 1+ pitting edema bilaterally. Notes 04/19/2023: On her right lower anterior tibial surface, there is a triangular-shaped wound covered with black eschar. Underneath, the fat layer is exposed. There is no periwound erythema or induration. No malodor or purulent drainage to suggest infection. [name], [name] ([id number]) [id number] Page 3 of 8 Electronic Signature(s) Signed: 04/19/2023 8:56:44 AM By: [name] Entered By: [name] on 04/19/2023 05:56:44 -------------------------------------------------------------------------------- Physician Orders Details Patient Name: Date of Service: [name]. 04/19/2023 8:00 A M Medical Record Number: [id number] Patient Account Number: [id number] Date of Birth/Sex: Treating RN: 07-19-1935 (88 y.o. [name] Primary Care Provider: Jarome Matin Other Clinician: Referring Provider: Treating Provider/Extender: Marena Chancy in Treatment: 0 The following information was scribed by: Brenton Grills The information was scribed for: [name] Verbal / Phone Orders: No Diagnosis Coding ICD-10 Coding Code Description [phone] Non-pressure chronic ulcer of other part of right lower leg with fat layer exposed E11.622 Type 2 diabetes mellitus with other skin ulcer Follow-up Appointments ppointment in 1 week. - Dr. Lady Gary - please make appointment Return A Anesthetic (In clinic) Topical Lidocaine 4% applied to wound bed Bathing/ Shower/ Hygiene May shower with protection but do not get wound dressing(s) wet. Protect dressing(s) with water repellant cover (for example, large plastic bag) or a cast cover and may then take shower. Edema Control - Lymphedema / SCD / Other Bilateral Lower Extremities Elevate legs to the level of the heart or above for 30 minutes daily and/or when sitting for 3-4 times a day throughout the day. Avoid standing for long periods of time. Exercise regularly Additional Orders / Instructions Follow Nutritious Diet - Increase protein intake or maintain at 70-100g/day. Wound Treatment Wound #1 - Lower Leg Wound Laterality: Right, Lateral Prim Dressing: Maxorb Extra Ag+ Alginate Dressing, 4x4.75 (in/in) ary Discharge Instructions: Apply to wound bed as instructed Secondary Dressing: Woven Gauze Sponge, Non-Sterile 4x4 in Discharge Instructions: Apply over primary dressing as directed. Compression Wrap: Urgo K2 Lite, (equivalent to a 3 layer) two layer compression system, regular Discharge Instructions: Apply Urgo K2 Lite as directed (alternative to 3 layer compression). Electronic Signature(s) Signed: 04/19/2023 11:42:07 AM By: [name] Entered By: [name] on 04/19/2023 05:56:55 [facility] ([id number]) [id number] Page 4 of  8 -------------------------------------------------------------------------------- Problem List Details Patient Name: Date of Service: [name] 04/19/2023 8:00 A M Medical Record Number: [id number] Patient Account Number: [id number] Date of Birth/Sex: Treating RN: 31-May-1935 (88 y.o. F) Primary Care Provider: Jarome Matin Other Clinician: Referring Provider: Treating Provider/Extender: Marena Chancy in Treatment: 0 Active Problems ICD-10 Encounter Code Description Active Date MDM Diagnosis [phone] Non-pressure chronic ulcer of other part of right lower leg with fat layer 04/19/2023 No Yes exposed E11.622 Type 2 diabetes mellitus with other skin ulcer 04/19/2023 No Yes Inactive Problems Resolved Problems Electronic Signature(s) Signed: 04/19/2023 7:58:43 AM By: [name] Entered By: [name] on 04/19/2023 04:58:43 -------------------------------------------------------------------------------- Progress Note Details Patient Name: Date of Service: [name]. 04/19/2023 8:00 A M Medical Record Number: [id number] Patient Account Number: [id number] Date of Birth/Sex: Treating RN: 08/08/1934 (88 y.o. F) Primary Care Provider:  eye surgery abdominal hysterectomy angioplasty tonsillectomy Family and Social History Cancer: Yes - Maternal  Grandparents; Diabetes: Yes - Mother,Siblings; Heart Disease: Yes - Father; Former smoker - ended on 07/31/1993; Alcohol Use: Never; Drug Use: No History; Caffeine Use: Never; Financial Concerns: No; Food, Clothing or Shelter Needs: No; Support System Lacking: No; Transportation Concerns: No Electronic Signature(s) Signed: 04/19/2023 11:42:07 AM By: [name] Signed: 04/19/2023 4:20:28 PM By: Brenton Grills Entered By: Brenton Grills on 04/19/2023 05:32:34 -------------------------------------------------------------------------------- SuperBill Details Patient Name: Date of Service: [name] 04/19/2023 Medical Record Number: [id number] Patient Account Number: [id number] Date of Birth/Sex: Treating RN: [birth year] (88 y.o. F) Primary Care Provider: Jarome Matin Other Clinician: Referring Provider: Treating Provider/Extender: Marena Chancy in Treatment: 0 Diagnosis Coding ICD-10 Codes Code Description [phone] Non-pressure chronic ulcer of other part of right lower leg with fat layer exposed E11.622 Type 2 diabetes mellitus with other skin ulcer Facility Procedures : CPT4 Code: [id number] Description: WOUND CARE VISIT-LEV 3 NEW PT Modifier: 25 Quantity: 1 : Candy, CPT4 Code: [id number] Tammy [name] (00 ICD- Description: 97597 - DEBRIDE WOUND 1ST 20 SQ CM OR < ICD-10 Diagnosis Description [id number]) [id number] 10 Diagnosis Description L97.812 Non-pressure chronic ulcer of other part of right lower leg with fat layer expo Modifier: [id number]_ sed Quantity: 1 51227.pdf Page 8 of 8 Physician Procedures : CPT4 Code Description Modifier [id number] [phone] - WC PHYS LEVEL 4 - NEW PT 25 ICD-10 Diagnosis Description L97.812 Non-pressure chronic ulcer of other part of right lower leg with fat layer exposed E11.622 Type 2 diabetes mellitus with other skin ulcer Quantity: 1 : [id number] 97597 - WC PHYS DEBR WO ANESTH 20 SQ CM ICD-10  Diagnosis Description L97.812 Non-pressure chronic ulcer of other part of right lower leg with fat layer exposed Quantity: 1 Electronic Signature(s) Signed: 04/19/2023 11:42:07 AM By: [name] Signed: 04/19/2023 4:20:28 PM By: Brenton Grills Previous Signature: 04/19/2023 8:58:45 AM Version By: [name] Entered By: Brenton Grills on 04/19/2023 06:02:48

## 2023-04-23 ENCOUNTER — Encounter: Payer: Self-pay | Admitting: *Deleted

## 2023-04-23 ENCOUNTER — Encounter: Payer: Self-pay | Admitting: Oncology

## 2023-04-23 ENCOUNTER — Other Ambulatory Visit: Payer: Self-pay | Admitting: Nurse Practitioner

## 2023-04-23 DIAGNOSIS — C3492 Malignant neoplasm of unspecified part of left bronchus or lung: Secondary | ICD-10-CM

## 2023-04-23 MED ORDER — OXYCODONE HCL 10 MG PO TABS: 10.0000 mg | ORAL_TABLET | Freq: Four times a day (QID) | ORAL | 0 refills | Status: DC | PRN

## 2023-04-25 ENCOUNTER — Inpatient Hospital Stay: Payer: Medicare Other

## 2023-04-25 ENCOUNTER — Inpatient Hospital Stay: Payer: Medicare Other | Admitting: Oncology

## 2023-04-25 ENCOUNTER — Encounter (HOSPITAL_BASED_OUTPATIENT_CLINIC_OR_DEPARTMENT_OTHER): Payer: Medicare Other | Admitting: General Surgery

## 2023-04-25 DIAGNOSIS — E11622 Type 2 diabetes mellitus with other skin ulcer: Secondary | ICD-10-CM | POA: Diagnosis not present

## 2023-04-25 NOTE — Progress Notes (Signed)
[name] ([id number]) [id number] Page 1 of 8 Visit Report for 04/25/2023 Chief Complaint Document Details Patient Name: Date of Service: [name] 04/25/2023 2:45 PM Medical Record Number: [id number] Patient Account Number: [id number] Date of Birth/Sex: Treating RN: 1934-10-09 (88 y.o. F) Primary Care Provider: Jarome Matin Other Clinician: Referring Provider: Treating Provider/Extender: Marena Chancy in Treatment: 0 Information Obtained from: Patient Chief Complaint Patient seen for complaints of Non-Healing Wound. Electronic Signature(s) Signed: 04/25/2023 4:11:46 PM By: [name] Entered By: [name] on 04/25/2023 16:11:46 -------------------------------------------------------------------------------- Debridement Details Patient Name: Date of Service: [name] 04/25/2023 2:45 PM Medical Record Number: [id number] Patient Account Number: [id number] Date of Birth/Sex: Treating RN: August 28, 1934 (88 y.o. [name] Primary Care Provider: Jarome Matin Other Clinician: Referring Provider: Treating Provider/Extender: Marena Chancy in Treatment: 0 Debridement Performed for Assessment: Wound #1 Right,Lateral Lower Leg Performed By: Physician [name], MD The following information was scribed by: Zenaida Deed The information was scribed for: [name] Debridement Type: Debridement Severity of Tissue Pre Debridement: Fat layer exposed Level of Consciousness (Pre-procedure): Awake and Alert Pre-procedure Verification/Time Out Yes - 15:05 Taken: Start Time: 15:07 Pain Control: Lidocaine 4% T opical Solution Percent of Wound Bed Debrided: 100% T Area Debrided (cm): otal 1.73 Tissue and other material debrided: Non-Viable, Slough, Biofilm, Slough Level: Non-Viable Tissue Debridement Description:  Selective/Open Wound Instrument: Curette Bleeding: Minimum Hemostasis Achieved: Pressure Procedural Pain: 5 Post Procedural Pain: 3 Response to Treatment: Procedure was tolerated well Level of Consciousness (Post- Awake and Alert procedure): Post Debridement Measurements of Total Wound Length: (cm) 2.2 Width: (cm) 1 Depth: (cm) 0.1 Volume: (cm) 0.173 [name] ([id number]) [id number] Page 2 of 8 Character of Wound/Ulcer Post Debridement: Improved Severity of Tissue Post Debridement: Fat layer exposed Post Procedure Diagnosis Same as Pre-procedure Electronic Signature(s) Signed: 04/25/2023 4:19:45 PM By: Zenaida Deed RN, BSN Signed: 04/25/2023 4:57:33 PM By: [name] Entered By: Zenaida Deed on 04/25/2023 15:10:51 -------------------------------------------------------------------------------- HPI Details Patient Name: Date of Service: [name] 04/25/2023 2:45 PM Medical Record Number: [id number] Patient Account Number: [id number] Date of Birth/Sex: Treating RN: 1934-08-04 (88 y.o. 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 She was recently discharged from the hospital where she was admitted after a fall. As part of the injuries she sustained in the fall, she struck the anterior tibial surface of her right lower leg. While in the hospital wound care was consulted and they recommended application of Mepitel and mupirocin. She was also discharged on the wound course of Augmentin due to concern for cellulitis associated with the  wound. Upon discharge from the hospital, she was referred to the wound care center for further evaluation and management. 04/25/2023: Her wound is about the same size today, perhaps slightly smaller. There is still little bit of slough accumulation at the cranial aspect of the wound where it is deepest. Edema control is good. Electronic Signature(s) Signed: 04/25/2023 4:16:31 PM By: [name] Entered By: [name] on 04/25/2023 16:16:30 -------------------------------------------------------------------------------- Physical Exam Details Patient Name: Date of Service: [name]  leg with fat layer exposed Type 2 diabetes mellitus with other skin ulcer Procedures Wound #1 Pre-procedure diagnosis of Wound #1 is a Diabetic Wound/Ulcer of the Lower Extremity located on the Right,Lateral Lower Leg .Severity of Tissue Pre Debridement is: Fat layer exposed. There was a Selective/Open Wound Non-Viable Tissue Debridement with a total area of 1.73 sq cm performed by [name], MD. With the following instrument(s): Curette to remove Non-Viable tissue/material. Material removed includes Slough and Biofilm and after achieving pain control using Lidocaine 4% T opical Solution. No specimens were taken. A time out was conducted at 15:05, prior to the start of the  procedure. A Minimum amount of bleeding was controlled with Pressure. The procedure was tolerated well with a pain level of 5 throughout and a pain level of 3 following the procedure. Post Debridement Measurements: 2.2cm length x 1cm width x 0.1cm depth; 0.173cm^3 volume. Character of Wound/Ulcer Post Debridement is improved. Severity of Tissue Post Debridement is: Fat layer exposed. Post procedure Diagnosis Wound #1: Same as Pre-Procedure [name], [name] ([id number]) [id number].pdf Page 6 of 8 Pre-procedure diagnosis of Wound #1 is a Diabetic Wound/Ulcer of the Lower Extremity located on the Right,Lateral Lower Leg . There was a Double Layer Compression Therapy Procedure by Zenaida Deed, RN. Post procedure Diagnosis Wound #1: Same as Pre-Procedure Notes: urgo lite. Plan Follow-up Appointments: Return Appointment in 1 week. - Dr. Lady Gary - Wed 10/2 @ 1:45 pm Anesthetic: (In clinic) Topical Lidocaine 4% applied to wound bed Bathing/ Shower/ Hygiene: May shower with protection but do not get wound dressing(s) wet. Protect dressing(s) with water repellant cover (for example, large plastic bag) or a cast cover and may then take shower. Edema Control - Lymphedema / SCD / Other: Elevate legs to the level of the heart or above for 30 minutes daily and/or when sitting for 3-4 times a day throughout the day. Avoid standing for long periods of time. Exercise regularly Additional Orders / Instructions: Follow Nutritious Diet - Increase protein intake or maintain at 70-100g/day. WOUND #1: - Lower Leg Wound Laterality: Right, Lateral Peri-Wound Care: Sween Lotion (Moisturizing lotion) 1 x Per Week/ Discharge Instructions: Apply moisturizing lotion as directed Prim Dressing: Maxorb Extra Ag+ Alginate Dressing, 4x4.75 (in/in) 1 x Per Week/ ary Discharge Instructions: Apply to wound bed as instructed Secondary Dressing: Woven Gauze Sponge, Non-Sterile 4x4 in 1 x Per  Week/ Discharge Instructions: Apply over primary dressing as directed. Com pression Wrap: Urgo K2 Lite, (equivalent to a 3 layer) two layer compression system, regular 1 x Per Week/ Discharge Instructions: Apply Urgo K2 Lite as directed (alternative to 3 layer compression). 04/25/2023: Her wound is about the same size today, perhaps slightly smaller. There is still little bit of slough accumulation at the cranial aspect of the wound where it is deepest. Edema control is good. I used a curette to debride slough and biofilm from her wound. We will continue silver alginate and Urgo light compression. Follow-up in 1 week. Electronic Signature(s) Signed: 04/25/2023 4:21:45 PM By: [name] Entered By: [name] on 04/25/2023 16:21:45 -------------------------------------------------------------------------------- HxROS Details Patient Name: Date of Service: [name] 04/25/2023 2:45 PM Medical Record Number: [id number] Patient Account Number: [id number] Date of Birth/Sex: Treating RN: 1935/05/09 (88 y.o. F) Primary Care Provider: Jarome Matin Other Clinician: Referring Provider: Treating Provider/Extender: Marena Chancy in Treatment: 0 Information Obtained From Chart Cardiovascular Medical History: Positive for: Coronary Artery Disease; Hypertension Endocrine Medical History: Positive for: Type  [name] ([id number]) [id number] Page 1 of 8 Visit Report for 04/25/2023 Chief Complaint Document Details Patient Name: Date of Service: [name] 04/25/2023 2:45 PM Medical Record Number: [id number] Patient Account Number: [id number] Date of Birth/Sex: Treating RN: 1934-10-09 (88 y.o. F) Primary Care Provider: Jarome Matin Other Clinician: Referring Provider: Treating Provider/Extender: Marena Chancy in Treatment: 0 Information Obtained from: Patient Chief Complaint Patient seen for complaints of Non-Healing Wound. Electronic Signature(s) Signed: 04/25/2023 4:11:46 PM By: [name] Entered By: [name] on 04/25/2023 16:11:46 -------------------------------------------------------------------------------- Debridement Details Patient Name: Date of Service: [name] 04/25/2023 2:45 PM Medical Record Number: [id number] Patient Account Number: [id number] Date of Birth/Sex: Treating RN: August 28, 1934 (88 y.o. [name] Primary Care Provider: Jarome Matin Other Clinician: Referring Provider: Treating Provider/Extender: Marena Chancy in Treatment: 0 Debridement Performed for Assessment: Wound #1 Right,Lateral Lower Leg Performed By: Physician [name], MD The following information was scribed by: Zenaida Deed The information was scribed for: [name] Debridement Type: Debridement Severity of Tissue Pre Debridement: Fat layer exposed Level of Consciousness (Pre-procedure): Awake and Alert Pre-procedure Verification/Time Out Yes - 15:05 Taken: Start Time: 15:07 Pain Control: Lidocaine 4% T opical Solution Percent of Wound Bed Debrided: 100% T Area Debrided (cm): otal 1.73 Tissue and other material debrided: Non-Viable, Slough, Biofilm, Slough Level: Non-Viable Tissue Debridement Description:  Selective/Open Wound Instrument: Curette Bleeding: Minimum Hemostasis Achieved: Pressure Procedural Pain: 5 Post Procedural Pain: 3 Response to Treatment: Procedure was tolerated well Level of Consciousness (Post- Awake and Alert procedure): Post Debridement Measurements of Total Wound Length: (cm) 2.2 Width: (cm) 1 Depth: (cm) 0.1 Volume: (cm) 0.173 [name] ([id number]) [id number] Page 2 of 8 Character of Wound/Ulcer Post Debridement: Improved Severity of Tissue Post Debridement: Fat layer exposed Post Procedure Diagnosis Same as Pre-procedure Electronic Signature(s) Signed: 04/25/2023 4:19:45 PM By: Zenaida Deed RN, BSN Signed: 04/25/2023 4:57:33 PM By: [name] Entered By: Zenaida Deed on 04/25/2023 15:10:51 -------------------------------------------------------------------------------- HPI Details Patient Name: Date of Service: [name] 04/25/2023 2:45 PM Medical Record Number: [id number] Patient Account Number: [id number] Date of Birth/Sex: Treating RN: 1934-08-04 (88 y.o. 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 She was recently discharged from the hospital where she was admitted after a fall. As part of the injuries she sustained in the fall, she struck the anterior tibial surface of her right lower leg. While in the hospital wound care was consulted and they recommended application of Mepitel and mupirocin. She was also discharged on the wound course of Augmentin due to concern for cellulitis associated with the  wound. Upon discharge from the hospital, she was referred to the wound care center for further evaluation and management. 04/25/2023: Her wound is about the same size today, perhaps slightly smaller. There is still little bit of slough accumulation at the cranial aspect of the wound where it is deepest. Edema control is good. Electronic Signature(s) Signed: 04/25/2023 4:16:31 PM By: [name] Entered By: [name] on 04/25/2023 16:16:30 -------------------------------------------------------------------------------- Physical Exam Details Patient Name: Date of Service: [name]  [name] 04/25/2023 2:45 PM Medical Record Number: [id number] Patient Account Number: [id number] Date of Birth/Sex: Treating RN: 10/02/1934 (88 y.o. F) Primary Care Provider: Jarome Matin Other Clinician: Referring Provider: Treating Provider/Extender: Marena Chancy in Treatment: 0 Constitutional Slightly hypertensive. . . . no acute distress. Respiratory Normal work of breathing on room air. Notes 04/25/2023: Her wound is about the same size today, perhaps slightly smaller. There is still little bit of slough accumulation at the cranial aspect of the wound where it is deepest. Edema control is good. [name], [name] ([id number]) [id number] Page 3 of 8 Electronic Signature(s) Signed: 04/25/2023 4:17:09 PM By: [name] Entered By: [name] on 04/25/2023 16:17:08 -------------------------------------------------------------------------------- Physician Orders Details Patient Name: Date of Service: [name] 04/25/2023 2:45 PM Medical Record Number: [id number] Patient Account Number: [id number] Date of Birth/Sex: Treating RN: 1935/01/05 (88 y.o. [name] Primary Care Provider: Jarome Matin Other Clinician: Referring Provider: Treating Provider/Extender: Marena Chancy in Treatment: 0 The following information was scribed by: Zenaida Deed The information was scribed for: [name] Verbal / Phone Orders: No Diagnosis Coding ICD-10 Coding Code Description [phone] Non-pressure chronic ulcer of other part of right lower leg with fat layer exposed E11.622 Type 2 diabetes mellitus with other skin ulcer Follow-up Appointments ppointment in 1 week. - Dr. Lady Gary - Return A Wed 10/2 @ 1:45 pm Anesthetic (In clinic) Topical Lidocaine 4% applied to wound bed Bathing/ Shower/ Hygiene May shower with protection but do not get wound dressing(s) wet. Protect dressing(s) with water repellant cover (for example, large plastic bag) or a cast cover and may then take shower. Edema Control - Lymphedema / SCD / Other Bilateral Lower Extremities Elevate legs to the level of the heart or above for 30 minutes daily and/or when sitting for 3-4 times a day throughout the day. Avoid standing for long periods of time. Exercise regularly Additional Orders / Instructions Follow Nutritious Diet - Increase protein intake or maintain at 70-100g/day. Wound Treatment Wound #1 - Lower Leg Wound Laterality: Right, Lateral Peri-Wound Care: Sween Lotion (Moisturizing lotion) 1 x Per Week Discharge Instructions: Apply moisturizing lotion as directed Prim Dressing: Maxorb Extra Ag+ Alginate Dressing, 4x4.75 (in/in) 1 x Per Week ary Discharge Instructions: Apply to wound bed as instructed Secondary Dressing: Woven Gauze Sponge, Non-Sterile 4x4 in 1 x Per Week Discharge Instructions: Apply over primary dressing as directed. Compression Wrap: Urgo K2 Lite, (equivalent to a 3 layer) two layer compression system, regular 1 x Per Week Discharge Instructions: Apply Urgo K2 Lite as directed (alternative to 3 layer compression). Electronic Signature(s) Signed: 04/25/2023 4:57:33 PM By: [name] Previous Signature: 04/25/2023 4:19:45 PM  Version By: Zenaida Deed RN, BSN Entered By: [name] on 04/25/2023 16:21:13 [name] ([id number]) [id number] Page 4 of 8 -------------------------------------------------------------------------------- Problem List Details Patient Name: Date of Service: [name] 04/25/2023 2:45 PM Medical Record Number: [id number] Patient Account Number: [id number] Date of Birth/Sex: Treating RN: 20-Sep-1934 (88 y.o. [name] Primary Care Provider: Jarome Matin Other Clinician: Referring Provider: Treating Provider/Extender: Marena Chancy in Treatment: 0 Active Problems ICD-10 Encounter Code Description Active Date MDM Diagnosis [phone] Non-pressure chronic ulcer of other part of right lower leg with fat layer 04/19/2023 No Yes exposed E11.622 Type 2 diabetes mellitus with other skin ulcer 04/19/2023 No Yes Inactive Problems Resolved Problems Electronic Signature(s) Signed: 04/25/2023 4:11:17  [name] ([id number]) [id number] Page 1 of 8 Visit Report for 04/25/2023 Chief Complaint Document Details Patient Name: Date of Service: [name] 04/25/2023 2:45 PM Medical Record Number: [id number] Patient Account Number: [id number] Date of Birth/Sex: Treating RN: 1934-10-09 (88 y.o. F) Primary Care Provider: Jarome Matin Other Clinician: Referring Provider: Treating Provider/Extender: Marena Chancy in Treatment: 0 Information Obtained from: Patient Chief Complaint Patient seen for complaints of Non-Healing Wound. Electronic Signature(s) Signed: 04/25/2023 4:11:46 PM By: [name] Entered By: [name] on 04/25/2023 16:11:46 -------------------------------------------------------------------------------- Debridement Details Patient Name: Date of Service: [name] 04/25/2023 2:45 PM Medical Record Number: [id number] Patient Account Number: [id number] Date of Birth/Sex: Treating RN: August 28, 1934 (88 y.o. [name] Primary Care Provider: Jarome Matin Other Clinician: Referring Provider: Treating Provider/Extender: Marena Chancy in Treatment: 0 Debridement Performed for Assessment: Wound #1 Right,Lateral Lower Leg Performed By: Physician [name], MD The following information was scribed by: Zenaida Deed The information was scribed for: [name] Debridement Type: Debridement Severity of Tissue Pre Debridement: Fat layer exposed Level of Consciousness (Pre-procedure): Awake and Alert Pre-procedure Verification/Time Out Yes - 15:05 Taken: Start Time: 15:07 Pain Control: Lidocaine 4% T opical Solution Percent of Wound Bed Debrided: 100% T Area Debrided (cm): otal 1.73 Tissue and other material debrided: Non-Viable, Slough, Biofilm, Slough Level: Non-Viable Tissue Debridement Description:  Selective/Open Wound Instrument: Curette Bleeding: Minimum Hemostasis Achieved: Pressure Procedural Pain: 5 Post Procedural Pain: 3 Response to Treatment: Procedure was tolerated well Level of Consciousness (Post- Awake and Alert procedure): Post Debridement Measurements of Total Wound Length: (cm) 2.2 Width: (cm) 1 Depth: (cm) 0.1 Volume: (cm) 0.173 [name] ([id number]) [id number] Page 2 of 8 Character of Wound/Ulcer Post Debridement: Improved Severity of Tissue Post Debridement: Fat layer exposed Post Procedure Diagnosis Same as Pre-procedure Electronic Signature(s) Signed: 04/25/2023 4:19:45 PM By: Zenaida Deed RN, BSN Signed: 04/25/2023 4:57:33 PM By: [name] Entered By: Zenaida Deed on 04/25/2023 15:10:51 -------------------------------------------------------------------------------- HPI Details Patient Name: Date of Service: [name] 04/25/2023 2:45 PM Medical Record Number: [id number] Patient Account Number: [id number] Date of Birth/Sex: Treating RN: 1934-08-04 (88 y.o. 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 She was recently discharged from the hospital where she was admitted after a fall. As part of the injuries she sustained in the fall, she struck the anterior tibial surface of her right lower leg. While in the hospital wound care was consulted and they recommended application of Mepitel and mupirocin. She was also discharged on the wound course of Augmentin due to concern for cellulitis associated with the  wound. Upon discharge from the hospital, she was referred to the wound care center for further evaluation and management. 04/25/2023: Her wound is about the same size today, perhaps slightly smaller. There is still little bit of slough accumulation at the cranial aspect of the wound where it is deepest. Edema control is good. Electronic Signature(s) Signed: 04/25/2023 4:16:31 PM By: [name] Entered By: [name] on 04/25/2023 16:16:30 -------------------------------------------------------------------------------- Physical Exam Details Patient Name: Date of Service: [name]

## 2023-04-25 NOTE — Progress Notes (Signed)
Assess peripheral edema status every visit. Compression as ordered Treatment Activities: Therapeutic compression applied : 04/25/2023 Notes: Wound/Skin Impairment Nursing Diagnoses: Knowledge deficit related to smoking impact on wound healing Goals: Patient/caregiver will verbalize understanding of skin care regimen Date Initiated: 04/19/2023 Target Resolution Date: 05/31/2023 Goal Status: Active Interventions: Assess patient/caregiver  ability to obtain necessary supplies Assess patient/caregiver ability to perform ulcer/skin care regimen upon admission and as needed Assess ulceration(s) every visit Provide education on ulcer and skin care Screen for HBO Treatment Activities: Skin care regimen initiated : 04/19/2023 Topical wound management initiated : 04/19/2023 Notes: Electronic Signature(s) Signed: 04/25/2023 4:19:45 PM By: Zenaida Deed RN, BSN Entered By: Zenaida Deed on 04/25/2023 15:02:39 -------------------------------------------------------------------------------- Pain Assessment Details Patient Name: Date of Service: [name] 04/25/2023 2:45 PM Medical Record Number: [id number] Patient Account Number: [id number] Date of Birth/Sex: Treating RN: [birth year] (88 y.o. F) Primary Care Naliah Eddington: Jarome Matin Other Clinician: Referring Rheta Hemmelgarn: Treating Lakrista Scaduto/Extender: Marena Chancy in Treatment: 0 Active Problems Location of Pain Severity and Description of Pain Patient Has Paino No Site Locations Rate the pain. [name], [name] ([id number]) [id number] Page 6 of 8 Rate the pain. Current Pain Level: 0 Pain Management and Medication Current Pain Management: Electronic Signature(s) Signed: 04/25/2023 4:19:45 PM By: Zenaida Deed RN, BSN Entered By: Zenaida Deed on 04/25/2023 14:57:58 -------------------------------------------------------------------------------- Patient/Caregiver Education Details Patient Name: Date of Service: [name] 9/25/2024andnbsp2:45 PM Medical Record Number: [id number] Patient Account Number: [id number] Date of Birth/Gender: Treating RN: 1934-11-01 (88 y.o. [name] Primary Care Physician: Jarome Matin Other Clinician: Referring Physician: Treating Physician/Extender: Marena Chancy in Treatment: 0 Education Assessment Education  Provided To: Patient Education Topics Provided Venous: Methods: Explain/Verbal Responses: Reinforcements needed, State content correctly Wound/Skin Impairment: Methods: Explain/Verbal Responses: Reinforcements needed, State content correctly Electronic Signature(s) Signed: 04/25/2023 4:19:45 PM By: Zenaida Deed RN, BSN Entered By: Zenaida Deed on 04/25/2023 15:03:00 -------------------------------------------------------------------------------- Wound Assessment Details Patient Name: Date of Service: [name] 04/25/2023 2:45 PM [name] ([id number]) [id number] Page 7 of 8 Medical Record Number: [id number] Patient Account Number: [id number] Date of Birth/Sex: Treating RN: 1934-08-26 (88 y.o. [name] Primary Care Lajuanna Pompa: Jarome Matin Other Clinician: Referring Upton Russey: Treating Cordelia Bessinger/Extender: Marena Chancy in Treatment: 0 Wound Status Wound Number: 1 Primary Diabetic Wound/Ulcer of the Lower Extremity Etiology: Wound Location: Right, Lateral Lower Leg Wound Status: Open Wounding Event: Trauma Comorbid Coronary Artery Disease, Hypertension, Type II Diabetes, Date Acquired: 03/29/2023 History: Osteoarthritis Weeks Of Treatment: 0 Clustered Wound: No Photos Wound Measurements Length: (cm) 2.2 Width: (cm) 1 Depth: (cm) 0.1 Area: (cm) 1.728 Volume: (cm) 0.173 % Reduction in Area: 15.4% % Reduction in Volume: 15.2% Epithelialization: Small (1-33%) Tunneling: No Undermining: No Wound Description Classification: Grade 1 Wound Margin: Distinct, outline attached Exudate Amount: Medium Exudate Type: Serosanguineous Exudate Color: red, brown Foul Odor After Cleansing: No Slough/Fibrino Yes Wound Bed Granulation Amount: Large (67-100%) Exposed Structure Granulation Quality: Red Fat Layer (Subcutaneous Tissue) Exposed: Yes Necrotic Amount: Small (1-33%) Necrotic  Quality: Adherent Slough Periwound Skin Texture Texture Color No Abnormalities Noted: Yes No Abnormalities Noted: Yes Moisture Temperature / Pain No Abnormalities Noted: Yes Temperature: No Abnormality Tenderness on Palpation: Yes Treatment Notes Wound #1 (Lower Leg) Wound Laterality: Right, Lateral Cleanser Peri-Wound Care Sween Lotion (Moisturizing lotion) Discharge Instruction: Apply moisturizing lotion as directed Topical Primary Dressing Maxorb Extra Ag+ Alginate Dressing, 4x4.75 (in/in) Discharge Instruction: Apply  Assess peripheral edema status every visit. Compression as ordered Treatment Activities: Therapeutic compression applied : 04/25/2023 Notes: Wound/Skin Impairment Nursing Diagnoses: Knowledge deficit related to smoking impact on wound healing Goals: Patient/caregiver will verbalize understanding of skin care regimen Date Initiated: 04/19/2023 Target Resolution Date: 05/31/2023 Goal Status: Active Interventions: Assess patient/caregiver  ability to obtain necessary supplies Assess patient/caregiver ability to perform ulcer/skin care regimen upon admission and as needed Assess ulceration(s) every visit Provide education on ulcer and skin care Screen for HBO Treatment Activities: Skin care regimen initiated : 04/19/2023 Topical wound management initiated : 04/19/2023 Notes: Electronic Signature(s) Signed: 04/25/2023 4:19:45 PM By: Zenaida Deed RN, BSN Entered By: Zenaida Deed on 04/25/2023 15:02:39 -------------------------------------------------------------------------------- Pain Assessment Details Patient Name: Date of Service: [name] 04/25/2023 2:45 PM Medical Record Number: [id number] Patient Account Number: [id number] Date of Birth/Sex: Treating RN: [birth year] (88 y.o. F) Primary Care Naliah Eddington: Jarome Matin Other Clinician: Referring Rheta Hemmelgarn: Treating Lakrista Scaduto/Extender: Marena Chancy in Treatment: 0 Active Problems Location of Pain Severity and Description of Pain Patient Has Paino No Site Locations Rate the pain. [name], [name] ([id number]) [id number] Page 6 of 8 Rate the pain. Current Pain Level: 0 Pain Management and Medication Current Pain Management: Electronic Signature(s) Signed: 04/25/2023 4:19:45 PM By: Zenaida Deed RN, BSN Entered By: Zenaida Deed on 04/25/2023 14:57:58 -------------------------------------------------------------------------------- Patient/Caregiver Education Details Patient Name: Date of Service: [name] 9/25/2024andnbsp2:45 PM Medical Record Number: [id number] Patient Account Number: [id number] Date of Birth/Gender: Treating RN: 1934-11-01 (88 y.o. [name] Primary Care Physician: Jarome Matin Other Clinician: Referring Physician: Treating Physician/Extender: Marena Chancy in Treatment: 0 Education Assessment Education  Provided To: Patient Education Topics Provided Venous: Methods: Explain/Verbal Responses: Reinforcements needed, State content correctly Wound/Skin Impairment: Methods: Explain/Verbal Responses: Reinforcements needed, State content correctly Electronic Signature(s) Signed: 04/25/2023 4:19:45 PM By: Zenaida Deed RN, BSN Entered By: Zenaida Deed on 04/25/2023 15:03:00 -------------------------------------------------------------------------------- Wound Assessment Details Patient Name: Date of Service: [name] 04/25/2023 2:45 PM [name] ([id number]) [id number] Page 7 of 8 Medical Record Number: [id number] Patient Account Number: [id number] Date of Birth/Sex: Treating RN: 1934-08-26 (88 y.o. [name] Primary Care Lajuanna Pompa: Jarome Matin Other Clinician: Referring Upton Russey: Treating Cordelia Bessinger/Extender: Marena Chancy in Treatment: 0 Wound Status Wound Number: 1 Primary Diabetic Wound/Ulcer of the Lower Extremity Etiology: Wound Location: Right, Lateral Lower Leg Wound Status: Open Wounding Event: Trauma Comorbid Coronary Artery Disease, Hypertension, Type II Diabetes, Date Acquired: 03/29/2023 History: Osteoarthritis Weeks Of Treatment: 0 Clustered Wound: No Photos Wound Measurements Length: (cm) 2.2 Width: (cm) 1 Depth: (cm) 0.1 Area: (cm) 1.728 Volume: (cm) 0.173 % Reduction in Area: 15.4% % Reduction in Volume: 15.2% Epithelialization: Small (1-33%) Tunneling: No Undermining: No Wound Description Classification: Grade 1 Wound Margin: Distinct, outline attached Exudate Amount: Medium Exudate Type: Serosanguineous Exudate Color: red, brown Foul Odor After Cleansing: No Slough/Fibrino Yes Wound Bed Granulation Amount: Large (67-100%) Exposed Structure Granulation Quality: Red Fat Layer (Subcutaneous Tissue) Exposed: Yes Necrotic Amount: Small (1-33%) Necrotic  Quality: Adherent Slough Periwound Skin Texture Texture Color No Abnormalities Noted: Yes No Abnormalities Noted: Yes Moisture Temperature / Pain No Abnormalities Noted: Yes Temperature: No Abnormality Tenderness on Palpation: Yes Treatment Notes Wound #1 (Lower Leg) Wound Laterality: Right, Lateral Cleanser Peri-Wound Care Sween Lotion (Moisturizing lotion) Discharge Instruction: Apply moisturizing lotion as directed Topical Primary Dressing Maxorb Extra Ag+ Alginate Dressing, 4x4.75 (in/in) Discharge Instruction: Apply  Wound Chart Details Patient Name: Date of Service: [name] 04/25/2023 2:45 PM Medical Record Number: [id number] Patient Account Number: [id number] Date of Birth/Sex: Treating RN: 04/27/1935 (88 y.o. F) Primary Care Chianna Spirito: Jarome Matin Other Clinician: Referring [place]: Treating Briell Paulette/Extender: Marena Chancy in Treatment: 0 Vital Signs Height(in): 65 Pulse(bpm): 65 Weight(lbs): 165 Blood Pressure(mmHg): 145/61 Body Mass Index(BMI): 27.5 Temperature(F): 98.2 Respiratory Rate(breaths/min): 18 [1:Photos:] [N/A:N/A] Right, Lateral Lower Leg N/A N/A Wound Location: Trauma N/A N/A Wounding Event: Diabetic Wound/Ulcer of the Lower N/A N/A Primary Etiology: Extremity Coronary Artery Disease, N/A N/A Comorbid History: Hypertension, Type II Diabetes, Osteoarthritis 03/29/2023 N/A N/A Date Acquired: 0 N/A N/A Weeks of Treatment: Open N/A N/A Wound Status: No N/A N/A Wound Recurrence: 2.2x1x0.1  N/A N/A Measurements L x W x D (cm) 1.728 N/A N/A A (cm) : rea 0.173 N/A N/A Volume (cm) : 15.40% N/A N/A % Reduction in A rea: 15.20% N/A N/A % Reduction in Volume: Grade 1 N/A N/A Classification: Medium N/A N/A Exudate A mount: Serosanguineous N/A N/A Exudate Type: red, brown N/A N/A Exudate Color: Distinct, outline attached N/A N/A Wound Margin: Large (67-100%) N/A N/A Granulation A mount: Red N/A N/A Granulation Quality: Small (1-33%) N/A N/A Necrotic A mount: Fat Layer (Subcutaneous Tissue): Yes N/A N/A Exposed Structures: Small (1-33%) N/A N/A Epithelialization: Debridement - Selective/Open Wound N/A N/A Debridement: Pre-procedure Verification/Time Out 15:05 N/A N/A Taken: Lidocaine 4% Topical Solution N/A N/A Pain Control: Slough N/A N/A Tissue Debrided: Non-Viable Tissue N/A N/A Level: 1.73 N/A N/A Debridement A (sq cm): rea Curette N/A N/A Instrument: Minimum N/A N/A Bleeding: Pressure N/A N/A Hemostasis A chieved: 5 N/A N/A Procedural Pain: 3 N/A N/A Post Procedural Pain: Procedure was tolerated well N/A N/A Debridement Treatment Response: 2.2x1x0.1 N/A N/A Post Debridement Measurements L x [name], [name] ([id number]) [id number].pdf Page 4 of 8 W x D (cm) 0.173 N/A N/A Post Debridement Volume: (cm) Scarring: No N/A N/A Periwound Skin Texture: No Abnormalities Noted N/A N/A Periwound Skin Moisture: Hemosiderin Staining: No N/A N/A Periwound Skin Color: No Abnormality N/A N/A Temperature: Yes N/A N/A Tenderness on Palpation: Compression Therapy N/A N/A Procedures Performed: Debridement Treatment Notes Wound #1 (Lower Leg) Wound Laterality: Right, Lateral Cleanser Peri-Wound Care Sween Lotion (Moisturizing lotion) Discharge Instruction: Apply moisturizing lotion as directed Topical Primary Dressing Maxorb Extra Ag+ Alginate Dressing, 4x4.75 (in/in) Discharge Instruction: Apply to wound bed as  instructed Secondary Dressing Woven Gauze Sponge, Non-Sterile 4x4 in Discharge Instruction: Apply over primary dressing as directed. Secured With Compression Wrap Urgo K2 Lite, (equivalent to a 3 layer) two layer compression system, regular Discharge Instruction: Apply Urgo K2 Lite as directed (alternative to 3 layer compression). Compression Stockings Add-Ons Electronic Signature(s) Signed: 04/25/2023 4:11:24 PM By: [name] Entered By: [name] on 04/25/2023 16:11:24 -------------------------------------------------------------------------------- Multi-Disciplinary Care Plan Details Patient Name: Date of Service: [name] 04/25/2023 2:45 PM Medical Record Number: [id number] Patient Account Number: [id number] Date of Birth/Sex: Treating RN: [birth year] (88 y.o. [name] Primary Care Lilybelle Mayeda: Jarome Matin Other Clinician: Referring Javarri Segal: Treating Eureka Valdes/Extender: Marena Chancy in Treatment: 0 Multidisciplinary Care Plan reviewed with physician Active Inactive Venous Leg Ulcer Nursing Diagnoses: Knowledge deficit related to disease process and management Potential for venous Insuffiency (use before diagnosis confirmed) Goals: Patient will maintain optimal edema control Date Initiated: 04/25/2023 Target Resolution Date: 05/16/2023 [name], [name] ([id number]) [phone].pdf Page 5 of 8 Goal Status: Active Interventions:  Assess peripheral edema status every visit. Compression as ordered Treatment Activities: Therapeutic compression applied : 04/25/2023 Notes: Wound/Skin Impairment Nursing Diagnoses: Knowledge deficit related to smoking impact on wound healing Goals: Patient/caregiver will verbalize understanding of skin care regimen Date Initiated: 04/19/2023 Target Resolution Date: 05/31/2023 Goal Status: Active Interventions: Assess patient/caregiver  ability to obtain necessary supplies Assess patient/caregiver ability to perform ulcer/skin care regimen upon admission and as needed Assess ulceration(s) every visit Provide education on ulcer and skin care Screen for HBO Treatment Activities: Skin care regimen initiated : 04/19/2023 Topical wound management initiated : 04/19/2023 Notes: Electronic Signature(s) Signed: 04/25/2023 4:19:45 PM By: Zenaida Deed RN, BSN Entered By: Zenaida Deed on 04/25/2023 15:02:39 -------------------------------------------------------------------------------- Pain Assessment Details Patient Name: Date of Service: [name] 04/25/2023 2:45 PM Medical Record Number: [id number] Patient Account Number: [id number] Date of Birth/Sex: Treating RN: [birth year] (88 y.o. F) Primary Care Naliah Eddington: Jarome Matin Other Clinician: Referring Rheta Hemmelgarn: Treating Lakrista Scaduto/Extender: Marena Chancy in Treatment: 0 Active Problems Location of Pain Severity and Description of Pain Patient Has Paino No Site Locations Rate the pain. [name], [name] ([id number]) [id number] Page 6 of 8 Rate the pain. Current Pain Level: 0 Pain Management and Medication Current Pain Management: Electronic Signature(s) Signed: 04/25/2023 4:19:45 PM By: Zenaida Deed RN, BSN Entered By: Zenaida Deed on 04/25/2023 14:57:58 -------------------------------------------------------------------------------- Patient/Caregiver Education Details Patient Name: Date of Service: [name] 9/25/2024andnbsp2:45 PM Medical Record Number: [id number] Patient Account Number: [id number] Date of Birth/Gender: Treating RN: 1934-11-01 (88 y.o. [name] Primary Care Physician: Jarome Matin Other Clinician: Referring Physician: Treating Physician/Extender: Marena Chancy in Treatment: 0 Education Assessment Education  Provided To: Patient Education Topics Provided Venous: Methods: Explain/Verbal Responses: Reinforcements needed, State content correctly Wound/Skin Impairment: Methods: Explain/Verbal Responses: Reinforcements needed, State content correctly Electronic Signature(s) Signed: 04/25/2023 4:19:45 PM By: Zenaida Deed RN, BSN Entered By: Zenaida Deed on 04/25/2023 15:03:00 -------------------------------------------------------------------------------- Wound Assessment Details Patient Name: Date of Service: [name] 04/25/2023 2:45 PM [name] ([id number]) [id number] Page 7 of 8 Medical Record Number: [id number] Patient Account Number: [id number] Date of Birth/Sex: Treating RN: 1934-08-26 (88 y.o. [name] Primary Care Lajuanna Pompa: Jarome Matin Other Clinician: Referring Upton Russey: Treating Cordelia Bessinger/Extender: Marena Chancy in Treatment: 0 Wound Status Wound Number: 1 Primary Diabetic Wound/Ulcer of the Lower Extremity Etiology: Wound Location: Right, Lateral Lower Leg Wound Status: Open Wounding Event: Trauma Comorbid Coronary Artery Disease, Hypertension, Type II Diabetes, Date Acquired: 03/29/2023 History: Osteoarthritis Weeks Of Treatment: 0 Clustered Wound: No Photos Wound Measurements Length: (cm) 2.2 Width: (cm) 1 Depth: (cm) 0.1 Area: (cm) 1.728 Volume: (cm) 0.173 % Reduction in Area: 15.4% % Reduction in Volume: 15.2% Epithelialization: Small (1-33%) Tunneling: No Undermining: No Wound Description Classification: Grade 1 Wound Margin: Distinct, outline attached Exudate Amount: Medium Exudate Type: Serosanguineous Exudate Color: red, brown Foul Odor After Cleansing: No Slough/Fibrino Yes Wound Bed Granulation Amount: Large (67-100%) Exposed Structure Granulation Quality: Red Fat Layer (Subcutaneous Tissue) Exposed: Yes Necrotic Amount: Small (1-33%) Necrotic  Quality: Adherent Slough Periwound Skin Texture Texture Color No Abnormalities Noted: Yes No Abnormalities Noted: Yes Moisture Temperature / Pain No Abnormalities Noted: Yes Temperature: No Abnormality Tenderness on Palpation: Yes Treatment Notes Wound #1 (Lower Leg) Wound Laterality: Right, Lateral Cleanser Peri-Wound Care Sween Lotion (Moisturizing lotion) Discharge Instruction: Apply moisturizing lotion as directed Topical Primary Dressing Maxorb Extra Ag+ Alginate Dressing, 4x4.75 (in/in) Discharge Instruction: Apply

## 2023-04-27 ENCOUNTER — Inpatient Hospital Stay: Payer: Medicare Other

## 2023-04-27 ENCOUNTER — Inpatient Hospital Stay (HOSPITAL_BASED_OUTPATIENT_CLINIC_OR_DEPARTMENT_OTHER): Payer: Medicare Other | Admitting: Nurse Practitioner

## 2023-04-27 ENCOUNTER — Encounter: Payer: Self-pay | Admitting: Nurse Practitioner

## 2023-04-27 ENCOUNTER — Inpatient Hospital Stay: Payer: Medicare Other | Attending: Oncology

## 2023-04-27 VITALS — BP 105/50 | HR 54 | Temp 98.2°F | Resp 18

## 2023-04-27 VITALS — BP 117/58 | HR 62 | Temp 98.1°F | Resp 18 | Ht 65.0 in | Wt 165.0 lb

## 2023-04-27 DIAGNOSIS — C7951 Secondary malignant neoplasm of bone: Secondary | ICD-10-CM | POA: Diagnosis present

## 2023-04-27 DIAGNOSIS — Z79620 Long term (current) use of immunosuppressive biologic: Secondary | ICD-10-CM | POA: Insufficient documentation

## 2023-04-27 DIAGNOSIS — C3492 Malignant neoplasm of unspecified part of left bronchus or lung: Secondary | ICD-10-CM

## 2023-04-27 DIAGNOSIS — C3432 Malignant neoplasm of lower lobe, left bronchus or lung: Secondary | ICD-10-CM | POA: Insufficient documentation

## 2023-04-27 DIAGNOSIS — Z5112 Encounter for antineoplastic immunotherapy: Secondary | ICD-10-CM | POA: Diagnosis present

## 2023-04-27 DIAGNOSIS — Z95828 Presence of other vascular implants and grafts: Secondary | ICD-10-CM

## 2023-04-27 LAB — CBC WITH DIFFERENTIAL (CANCER CENTER ONLY)
Abs Immature Granulocytes: 0.02 10*3/uL (ref 0.00–0.07)
Basophils Absolute: 0 10*3/uL (ref 0.0–0.1)
Basophils Relative: 0 %
Eosinophils Absolute: 0.1 10*3/uL (ref 0.0–0.5)
Eosinophils Relative: 1 %
HCT: 29.2 % — ABNORMAL LOW (ref 36.0–46.0)
Hemoglobin: 9.4 g/dL — ABNORMAL LOW (ref 12.0–15.0)
Immature Granulocytes: 0 %
Lymphocytes Relative: 6 %
Lymphs Abs: 0.4 10*3/uL — ABNORMAL LOW (ref 0.7–4.0)
MCH: 29.7 pg (ref 26.0–34.0)
MCHC: 32.2 g/dL (ref 30.0–36.0)
MCV: 92.1 fL (ref 80.0–100.0)
Monocytes Absolute: 0.5 10*3/uL (ref 0.1–1.0)
Monocytes Relative: 8 %
Neutro Abs: 5.2 10*3/uL (ref 1.7–7.7)
Neutrophils Relative %: 85 %
Platelet Count: 176 10*3/uL (ref 150–400)
RBC: 3.17 MIL/uL — ABNORMAL LOW (ref 3.87–5.11)
RDW: 14.8 % (ref 11.5–15.5)
WBC Count: 6.1 10*3/uL (ref 4.0–10.5)
nRBC: 0 % (ref 0.0–0.2)

## 2023-04-27 LAB — TSH: TSH: 1.084 u[IU]/mL (ref 0.350–4.500)

## 2023-04-27 LAB — CMP (CANCER CENTER ONLY)
ALT: 9 U/L (ref 0–44)
AST: 14 U/L — ABNORMAL LOW (ref 15–41)
Albumin: 3.7 g/dL (ref 3.5–5.0)
Alkaline Phosphatase: 67 U/L (ref 38–126)
Anion gap: 7 (ref 5–15)
BUN: 30 mg/dL — ABNORMAL HIGH (ref 8–23)
CO2: 32 mmol/L (ref 22–32)
Calcium: 9.7 mg/dL (ref 8.9–10.3)
Chloride: 100 mmol/L (ref 98–111)
Creatinine: 0.94 mg/dL (ref 0.44–1.00)
GFR, Estimated: 59 mL/min — ABNORMAL LOW (ref >60)
Glucose, Bld: 124 mg/dL — ABNORMAL HIGH (ref 70–99)
Potassium: 3.8 mmol/L (ref 3.5–5.1)
Sodium: 139 mmol/L (ref 135–145)
Total Bilirubin: 0.3 mg/dL (ref 0.3–1.2)
Total Protein: 6.3 g/dL — ABNORMAL LOW (ref 6.5–8.1)

## 2023-04-27 MED ORDER — SODIUM CHLORIDE 0.9 % IV SOLN
200.0000 mg | Freq: Once | INTRAVENOUS | Status: AC
  Administered 2023-04-27: 200 mg via INTRAVENOUS
  Filled 2023-04-27: qty 8

## 2023-04-27 MED ORDER — HEPARIN SOD (PORK) LOCK FLUSH 100 UNIT/ML IV SOLN
500.0000 [IU] | Freq: Once | INTRAVENOUS | Status: AC | PRN
  Administered 2023-04-27: 500 [IU]

## 2023-04-27 MED ORDER — SODIUM CHLORIDE 0.9% FLUSH
10.0000 mL | INTRAVENOUS | Status: DC | PRN
  Administered 2023-04-27: 10 mL

## 2023-04-27 MED ORDER — SODIUM CHLORIDE 0.9% FLUSH
10.0000 mL | Freq: Once | INTRAVENOUS | Status: AC
  Administered 2023-04-27: 10 mL

## 2023-04-27 MED ORDER — SODIUM CHLORIDE 0.9 % IV SOLN: Freq: Once | INTRAVENOUS | Status: AC

## 2023-04-27 NOTE — Progress Notes (Signed)
  Patient seen by Lonna Cobb NP today  Vitals are within treatment parameters:Yes   Labs are within treatment parameters: Yes   Treatment plan has been signed: Yes   Per physician team, Patient is ready for treatment and there are  modifications to the treatment plan.

## 2023-04-27 NOTE — Progress Notes (Signed)
No Zometa today per Delorise Shiner, [facility]

## 2023-04-27 NOTE — Progress Notes (Signed)
[facility] OFFICE PROGRESS NOTE   Diagnosis: Non-small cell lung cancer  INTERVAL HISTORY:   [name] returns for follow-up.  She was hospitalized 03/31/2023 after a fall.  She had acute renal failure and mild rhabdomyolysis on admission.  She was discharged to a nursing facility 04/07/2023.  She has since returned home.  She continues to have pain at the left hip/groin.  She has difficulty sleeping due to pain.  She cannot get comfortable.  She estimates taking 10 mg of oxycodone every 5-6 hours.  Duragesic patch 12 mcg every 3 days.  Bowels are moving.  She has intermittent nausea.  No rash.  She reports a wound at the right lower leg, being followed by the wound clinic.  Objective:  Vital signs in last 24 hours:  Blood pressure (!) 117/58, pulse 62, temperature 98.1 F (36.7 C), temperature source Tympanic, resp. rate 18, height 5\' 5"  (1.651 m), weight 165 lb (74.8 kg), SpO2 95%.    HEENT: No thrush or ulcers. Resp: Lungs clear bilaterally. Cardio: Regular rate and rhythm. GI: No hepatosplenomegaly. Vascular: Trace bilateral lower leg edema.  Right lower leg is wrapped.  Marked tenderness region of the left greater trochanter. Port-A-Cath without erythema.  Lab Results:  Lab Results  Component Value Date   WBC 6.1 04/27/2023   HGB 9.4 (L) 04/27/2023   HCT 29.2 (L) 04/27/2023   MCV 92.1 04/27/2023   PLT 176 04/27/2023   NEUTROABS 5.2 04/27/2023    Imaging:  No results found.  Medications: I have reviewed the patient's current medications.  Assessment/Plan: Non-small cell lung cancer MRI lumbar spine 04/29/2019- enlarging marrow lesions involving the L1 vertebral body, upper left sacrum and right iliac bone MRI pelvis 04/29/2019- 3.5 cm left iliac bone lesion appears slightly larger; other similar appearing lesions present within the left superior pubic ramus, left superior abdomen acetabulum and upper left sacrum Kappa free light chains with mild  elevation 05/12/2019  CTs 05/12/2019- left lower lobe pulmonary mass 3.3 x 3.2 cm; lytic process left iliac bone; spinal lesions; 1.1 cm low-density left kidney lesion; right thyroid enlargement with heterogeneous appearance with potential for multiple discrete lesions Biopsy left lower lobe lung mass 05/26/2019-poorly differentiated carcinoma; positive for cytokeratin 5/6, p63 and TTF-1, no EGFR, BRAF, ALK, ERBB2,ROS, or NTRK alteration Cycle 1 carboplatin/Alimta/pembrolizumab 06/06/2019 Cycle 2 carboplatin/Alimta/pembrolizumab 06/27/2019 Cycle 3 carboplatin/Alimta/pembrolizumab 07/18/2019 Cycle 4 carboplatin/Alimta/pembrolizumab 08/07/2019 CTs 08/27/2019-significant decrease in size of lobulated mass left lower lobe.  Unchanged appearance of subtle bone lesions. Cycle 5 Alimta/pembrolizumab 08/28/2019 Cycle 6 Alimta/pembrolizumab 09/18/2019 Cycle 7 Alimta/pembrolizumab 10/09/2019 Cycle 8 Alimta/pembrolizumab 10/30/2019 Cycle 9 Alimta/pembrolizumab 11/20/2019 CTs 12/09/2019-no evidence of disease progression, left lower lobe nodule slightly decreased in size, stable L1, left sacral, and left pubic ramus metastases Cycle 10 Alimta/pembrolizumab 12/11/2019 Cycle 11 pembrolizumab alone 01/02/2020 (Alimta held due to edema, tenderness, erythema at the lower legs) Cycle 12 pembrolizumab 01/23/2020 Cycle 13 pembrolizumab 02/12/2020 Cycle 14 pembrolizumab 03/04/2020 CTs 03/18/2020-stable left lower lobe lesion, mild sclerosis at the superior endplate of L1 that was previously hypermetabolic, stable small left upper sacral lucent lesion, previous left superior pubic ramus lesion is occult on the CT, CT head negative for malignancy Cycle 15 pembrolizumab 03/25/2020 Cycle 16 pembrolizumab 04/21/2020 Cycle 17 pembrolizumab 05/17/2020 05/19/2020 bone scan-no definite abnormalities to suggest osseous metastases.  Areas of concern on prior PET-CT involving left iliac bone and L1 vertebral body showed no abnormalities on the  current study Cycle 18 Pembrolizumab 06/10/2020 Cycle 19 Pembrolizumab 06/30/2020 CTs 07/16/2020-stable  left lower lobe nodule, stable faint superior L1 vertebral lesion, no evidence of disease progression Cycle 20 pembrolizumab 07/21/2020 Cycle 21 Pembrolizumab 08/11/2020 Cycle 22 pembrolizumab 09/01/2020 Cycle 23 Pembrolizumab 09/22/2020 Cycle 24 pembrolizumab 10/13/2020 Cycle 25 Pembrolizumab 11/03/2020 Cycle 26 pembrolizumab 11/26/2020 CTs 12/15/2020- stable left lower lobe mass, stable sclerotic lesion at L2, no evidence of disease progression Cycle 27 pembrolizumab 12/17/2020 PET scan 01/03/2021-1.8 x 1.3 cm left lower lobe nodule similar in size to CT of 12/15/2020 and measures smaller than previous PET/CT from 2020.  Nodule is markedly hypermetabolic.  No evidence for hypermetabolic hilar or mediastinal lymphadenopathy.  Several tiny foci of hypermetabolism identified in bony anatomy raising concern for skeletal metastases.  Comparison of the PET to CT 07/16/2020-lobular left lower lobe pulmonary nodule measured 2.4 x 1.5 cm on the prior study, current study it measured 1.8 x 1.3 cm.  Lesion in the anterior left acetabulum is similar to the 07/16/2020 exam although overlying cortical thinning slightly more pronounced on the current study.  Described lesion in the scapula shows some cortical sclerosis and a tiny central marrow lucency not substantially changed compared to 07/16/2020.  Left third rib lesion shows heterogeneous mineralization similar to 07/16/2020. MRI of cervical spine 01/13/2021-increased left facet edema at C5-C6, severe facet arthrosis on the left at C7-T1 and on the right at C3-C4, no evidence of metastatic disease Cycle 28 Pembrolizumab 01/14/2021 Cycle 29 Pembrolizumab 02/03/2021 Cycle 30 Pembrolizumab 02/24/2021 MRI left hip 03/02/2021-compared to 04/29/2019, slight increase in size of metastases at the left iliac and left acetabulum.  Lesion at the left upper sacrum is less conspicuous and  a lesion at the left superior pubic ramus is stable Cycle 31 pembrolizumab 03/17/2021 Radiation to left acetabulum and left iliac 03/30/2021-04/13/2021 Cycle 32 pembrolizumab 04/07/2021 Cycle 33 Pembrolizumab 04/28/2021 Cycle 34 pembrolizumab 05/18/2021 PET scan 06/01/2021-no significant change in size or degree of FDG uptake associated with FDG avid left lower lobe lung nodule compatible with neoplasm.  Stable to improved appearance of multifocal FDG avid bone metastasis. Cycle 35 pembrolizumab 06/10/2021 Cycle 36 pembrolizumab 07/01/2021 Cycle 37 pembrolizumab 07/29/2021 Cycle 38 pembrolizumab 08/19/2021 Cycle 39 Pembrolizumab 09/09/2021 Cycle 40 pembrolizumab 09/30/2021 CT chest 10/20/2021 to evaluate complaints of chest pain-no PE.  Interval increase in left lower lobe mass. Cycle 41 Pembrolizumab 10/21/2021 Cycle 42 pembrolizumab 11/11/2021 PET 11/24/2021-mild increase in size of left lower lobe mass, no evidence of solid organ or nodal metastases, stable multifocal bone metastases.  No new sites of metastatic disease. Cycle 43 pembrolizumab 11/29/2021 Cycle 45 Pembrolizumab 12/20/2021 Cycle 46 Pembrolizumab 01/09/2022 Cycle 47 pembrolizumab 02/01/2022 Cycle 48 Pembrolizumab 02/24/2022 Cycle 49 Pembrolizumab 03/17/2022 Cycle 50 pembrolizumab 04/07/2022 MRI l pelvis 04/12/2022-"new "lesion at the left superior acetabulum, stable bone lesions at the left suprapubic ramus, left S1, and left iliac Cycle 51 pembrolizumab 04/28/2022 Palliative radiation to the left hip and left scapula 04/26/2022-05/09/2022 Cycle 52 pembrolizumab 05/18/2022 PET 06/02/2022-stable left lower lobe tumor, slight increase in hypermetabolism associated with the left acetabular metastasis, additional previously seen bone lesions no longer have hypermetabolism Cycle 53 pembrolizumab 06/08/2022 Cycle 54 pembrolizumab 06/29/2022 Cycle 55 pembrolizumab 07/20/2022 Cycle 56 pembrolizumab 08/10/2022 Cycle 57 pembrolizumab 08/31/2022 Cycle 58  pembrolizumab 09/21/2022 Cycle 59 pembrolizumab 10/12/2022 Cycle 60 Pembrolizumab 11/02/2022 Cycle 61 pembrolizumab 11/23/2022 Cycle 62 Pembrolizumab 12/21/2022 PET scan 12/29/2022-mild increase in size and activity of the left lower lobe mass Cycle 63 Pembrolizumab 01/11/2023 Cycle 64 Pembrolizumab 01/31/2023 Cycle 65 pembrolizumab 02/21/2023 Cycle 66 Pembrolizumab 03/14/2023 CT pelvis 03/31/2023-mild progressive displacement of the left acetabular fracture,  no fracture healing, no new fractures, no new lytic or blastic lesions Cycle 67 Pembrolizumab 04/27/2023 Pain secondary to metastatic lung cancer Chronic back pain Type 2 diabetes Essential hypertension CAD Hyperlipidemia Family history significant for multiple members with breast cancer Grade 1 skin rash 07/18/2019 likely related to immunotherapy.  Topical steroid cream as needed. E. coli urinary tract infection 07/14/2019.  Completed cephalexin. Edema/tenderness at the right greater than left ankle 10/21/2019-etiology unclear, potentially related to systemic therapy or an infection, doxycycline prescribed-improved 10/23/2019; marked improvement 11/20/2019; at office visit 01/02/2020 she reports worsening of lower extremity edema, pain/tenderness, erythema 3 to 4 days following each treatment.  Alimta held 01/02/2020.  Referral to dermatology. COVID-19 infection 03/30/2020, monoclonal antibody therapy 04/06/2020 Arthritis, possibly related to Pembrolizumab, improved since beginning Medrol, now followed by Dr. Dierdre Forth Pain left femoral head/upper femur/groin-negative plain x-ray; CT pelvis 03/28/2022-no acute appearing bone abnormality at the left hip or involving the proximal left femur, no evidence of acute fracture or dislocation, soft tissues left hip and left groin unremarkable; osseous mets within the left sacrum, left iliac bone and anterior acetabulum without evidence of associated fracture or dislocation. Percutaneous fixation of left pathologic  acetabular fracture 08/23/2022 CT pelvis and left hip 11/02/2022-subacute nondisplaced fracture through the supra-acetabular left ilium extending through the acetabular roof, placement of 2 screws within the left ilium extending to the supra-acetabular region with an additional screw traversing the left superior pubic ramus, subtle sclerosis in the left ilium and superior left acetabulum, no new site of metastatic disease. CT pelvis 03/31/2023-mild progressive displacement of the left acetabular fracture, no fracture healing   15.  Admission 03/31/2023 after a fall, difficulty with ambulation secondary to generalized weakness and left hip pain 16. acute renal failure and mild rhabdomyolysis on hospital admission 03/31/2023  Disposition: Ms. Koke appears stable.  There is no evidence of disease progression.  Plan to continue Pembrolizumab, treatment today.  There is a pathologic fracture of the left acetabulum.  She is currently on a Duragesic 12 mcg patch every 3 days, takes oxycodone 10 mg about every 5-6 hours.  She agrees to increase the Duragesic patch to 25 mcg every 3 days, continue oxycodone as needed.  She would like another opinion regarding the acetabular fracture.  We will ask orthopedics to review the recent CT.  We discussed beginning Zometa.  We reviewed potential side effects including flulike symptoms and osteonecrosis of the jaw.  She has an appointment with her dentist in October.  Plan to begin Zometa once dental clearance has been obtained.  She will return for follow-up and treatment in 3 weeks.    Patient seen with Dr. Truett Perna.    Lonna Cobb ANP/GNP-BC   04/27/2023  9:09 AM  This was a shared visit with Lonna Cobb.  Ms. Rennaker was interviewed and examined.  There is no clinical evidence for progression of the metastatic lung cancer.  She will continue pembrolizumab.  Her chief complaint continues to be pain at the left "hip ".  She is tender over the left trochanter  and has pain in the groin with motion at the left hip.  I suspect the discomfort is related to the acetabular fracture.  She would like another orthopedic opinion.  I will contact Dr. Charlann Boxer.  I was present for greater than 50% of today's visit.  I performed medical decision making.  Mancel Bale, MD

## 2023-04-27 NOTE — Patient Instructions (Signed)
[facility]   Discharge Instructions: Thank you for choosing [facility] to provide your oncology and hematology care.   If you have a lab appointment with the [facility], please go directly to the [facility] and check in at the registration area.   Wear comfortable clothing and clothing appropriate for easy access to any Portacath or PICC line.   We strive to give you quality time with your provider. You may need to reschedule your appointment if you arrive late (15 or more minutes).  Arriving late affects you and other patients whose appointments are after yours.  Also, if you miss three or more appointments without notifying the office, you may be dismissed from the clinic at the provider's discretion.      For prescription refill requests, have your pharmacy contact our office and allow 72 hours for refills to be completed.    Today you received the following chemotherapy and/or immunotherapy agents Keytruda      To help prevent nausea and vomiting after your treatment, we encourage you to take your nausea medication as directed.  BELOW ARE SYMPTOMS THAT SHOULD BE REPORTED IMMEDIATELY: *FEVER GREATER THAN 100.4 F (38 C) OR HIGHER *CHILLS OR SWEATING *NAUSEA AND VOMITING THAT IS NOT CONTROLLED WITH YOUR NAUSEA MEDICATION *UNUSUAL SHORTNESS OF BREATH *UNUSUAL BRUISING OR BLEEDING *URINARY PROBLEMS (pain or burning when urinating, or frequent urination) *BOWEL PROBLEMS (unusual diarrhea, constipation, pain near the anus) TENDERNESS IN MOUTH AND THROAT WITH OR WITHOUT PRESENCE OF ULCERS (sore throat, sores in mouth, or a toothache) UNUSUAL RASH, SWELLING OR PAIN  UNUSUAL VAGINAL DISCHARGE OR ITCHING   Items with * indicate a potential emergency and should be followed up as soon as possible or go to the Emergency Department if any problems should occur.  Please show the CHEMOTHERAPY ALERT CARD or IMMUNOTHERAPY ALERT CARD at  check-in to the Emergency Department and triage nurse.  Should you have questions after your visit or need to cancel or reschedule your appointment, please contact [facility]  Dept: [phone]  and follow the prompts.  Office hours are 8:00 a.m. to 4:30 p.m. Monday - Friday. Please note that voicemails left after 4:00 p.m. may not be returned until the following business day.  We are closed weekends and major holidays. You have access to a nurse at all times for urgent questions. Please call the main number to the clinic Dept: [phone] and follow the prompts.   For any non-urgent questions, you may also contact your provider using MyChart. We now offer e-Visits for anyone 80 and older to request care online for non-urgent symptoms. For details visit [email or web address].   Also download the MyChart app! Go to the app store, search "MyChart", open the app, select [facility], and log in with your MyChart username and password.

## 2023-04-28 LAB — T4: T4, Total: 7 ug/dL (ref 4.5–12.0)

## 2023-04-30 ENCOUNTER — Encounter: Payer: Self-pay | Admitting: Nurse Practitioner

## 2023-04-30 ENCOUNTER — Encounter: Payer: Self-pay | Admitting: Oncology

## 2023-05-02 ENCOUNTER — Encounter (HOSPITAL_BASED_OUTPATIENT_CLINIC_OR_DEPARTMENT_OTHER): Payer: Medicare Other | Attending: General Surgery | Admitting: General Surgery

## 2023-05-02 ENCOUNTER — Encounter: Payer: Self-pay | Admitting: Nurse Practitioner

## 2023-05-02 ENCOUNTER — Other Ambulatory Visit: Payer: Self-pay | Admitting: Nurse Practitioner

## 2023-05-02 DIAGNOSIS — L97812 Non-pressure chronic ulcer of other part of right lower leg with fat layer exposed: Secondary | ICD-10-CM | POA: Insufficient documentation

## 2023-05-02 DIAGNOSIS — C3492 Malignant neoplasm of unspecified part of left bronchus or lung: Secondary | ICD-10-CM

## 2023-05-02 DIAGNOSIS — E11622 Type 2 diabetes mellitus with other skin ulcer: Secondary | ICD-10-CM | POA: Diagnosis present

## 2023-05-02 MED ORDER — OXYCODONE HCL 10 MG PO TABS
10.0000 mg | ORAL_TABLET | Freq: Four times a day (QID) | ORAL | 0 refills | Status: DC | PRN
Start: 2023-05-02 — End: 2023-05-18

## 2023-05-02 NOTE — Progress Notes (Signed)
SHARLOTTE, BAKA ([id number]) [id number] Page 1 of 8 Visit Report for 05/02/2023 Chief Complaint Document Details Patient Name: Date of Service: [name] 05/02/2023 1:45 PM Medical Record Number: [id number] Patient Account Number: [id number] Date of Birth/Sex: Treating RN: 06-04-1935 (88 y.o. F) Primary Care Provider: Eloisa Northern Other Clinician: Referring Provider: Treating Provider/Extender: Marena Chancy in Treatment: 1 Information Obtained from: Patient Chief Complaint Patient seen for complaints of Non-Healing Wound. Electronic Signature(s) Signed: 05/02/2023 3:02:42 PM By: [name] Entered By: [name] on 05/02/2023 12:02:41 -------------------------------------------------------------------------------- Debridement Details Patient Name: Date of Service: [name] 05/02/2023 1:45 PM Medical Record Number: [id number] Patient Account Number: [id number] Date of Birth/Sex: Treating RN: May 20, 1935 (88 y.o. [name] Primary Care Provider: Eloisa Northern Other Clinician: Referring Provider: Treating Provider/Extender: Marena Chancy in Treatment: 1 Debridement Performed for Assessment: Wound #1 Right,Lateral Lower Leg Performed By: Physician [name], MD The following information was scribed by: Tommie Ard The information was scribed for: [name] Debridement Type: Debridement Severity of Tissue Pre Debridement: Fat layer exposed Level of Consciousness (Pre-procedure): Awake and Alert Pre-procedure Verification/Time Out Yes - 14:08 Taken: Start Time: 14:09 Pain Control: Lidocaine 4% Topical Solution Percent of Wound Bed Debrided: 100% T Area Debrided (cm): otal 0.09 Tissue and other material debrided: Non-Viable, Eschar, Slough, Slough Level: Non-Viable Tissue Debridement Description: Selective/Open  Wound Instrument: Curette Bleeding: Minimum Hemostasis Achieved: Pressure Response to Treatment: Procedure was tolerated well Level of Consciousness (Post- Awake and Alert procedure): Post Debridement Measurements of Total Wound Length: (cm) 0.4 Width: (cm) 0.3 Depth: (cm) 0.1 Volume: (cm) 0.009 Character of Wound/Ulcer Post Debridement: Requires Further Debridement Severity of Tissue Post Debridement: Fat layer exposed INESHA, [name] ([id number]) [phone].pdf Page 2 of 8 Post Procedure Diagnosis Same as Pre-procedure Electronic Signature(s) Signed: 05/02/2023 2:35:42 PM By: Tommie Ard RN Signed: 05/02/2023 4:32:07 PM By: [name] Entered By: Tommie Ard on 05/02/2023 11:10:02 -------------------------------------------------------------------------------- HPI Details Patient Name: Date of Service: [name] 05/02/2023 1:45 PM Medical Record Number: [id number] Patient Account Number: [id number] Date of Birth/Sex: Treating RN: July 11, 1935 (88 y.o. 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 She was recently discharged from the hospital where she was admitted after a fall. As part of the injuries she sustained in the fall, she struck the anterior tibial surface of her right lower leg. While in the hospital wound care was consulted and they recommended application of Mepitel and mupirocin. She was also discharged on a course of Augmentin due to concern for cellulitis associated with the wound. Upon discharge from the hospital, she was referred  to the wound care center for further evaluation and management. 04/25/2023: Her wound is about the same size today, perhaps slightly smaller. There is still little bit of slough accumulation at the cranial aspect of the wound where it is deepest. Edema control is good. 05/02/2023: The wound is markedly smaller today with just the area at the most cranial aspect remaining open. It is shallower than it was last week and has just a small amount of slough. Edema control remains  skin ulcer 04/19/2023 No Yes Inactive Problems Resolved Problems Electronic Signature(s) Signed: 05/02/2023 3:02:13 PM By: [name] Entered By: [name] on 05/02/2023 12:02:13 -------------------------------------------------------------------------------- Progress Note Details Patient Name: Date of Service: [name] 05/02/2023 1:45 PM Medical Record Number: [id number] Patient Account Number: [id number] Date of Birth/Sex: Treating RN: 14-Nov-1934 (88 y.o. F) Primary Care Provider: Eloisa Northern Other Clinician: Referring Provider: Treating Provider/Extender: Marena Chancy in Treatment: 1 Subjective Chief Complaint Information obtained from Patient Patient seen for complaints of Non-Healing Wound. History of Present Illness (HPI) ADMISSION 04/19/2023 ***ABIs R: 1.03; L: 1.01*** This is an 88 year old woman with type 2 diabetes (Most recent hemoglobin A1c 7.0% on April 04, 2023) and history of non-small cell lung  cancer with bony metastases. She was recently discharged from the hospital where she was admitted after a fall. As part of the injuries she sustained in the fall, she struck the anterior tibial surface of her right lower leg. While in the hospital wound care was consulted and they recommended application of Mepitel and mupirocin. She was also discharged on a course of Augmentin due to concern for cellulitis associated with the wound. Upon discharge from the hospital, she was referred to the wound care center for further evaluation and management. 04/25/2023: Her wound is about the same size today, perhaps slightly smaller. There is still little bit of slough accumulation at the cranial aspect of the wound where it is deepest. Edema control is good. 05/02/2023: The wound is markedly smaller today with just the area at the most cranial aspect remaining open. It is shallower than it was last week and has just a small amount of slough. Edema control remains good. [name], [name] ([id number]) [id number] Page 5 of 8 Patient History Information obtained from Chart. Family History Cancer - Maternal Grandparents, Diabetes - Mother,Siblings, Heart Disease - Father. Social History Former smoker - ended on 07/31/1993, Alcohol Use - Never, Drug Use - No History, Caffeine Use - Never. Medical History Cardiovascular Patient has history of Coronary Artery Disease, Hypertension Endocrine Patient has history of Type II Diabetes - states "prediabetic" Musculoskeletal Patient has history of Osteoarthritis Hospitalization/Surgery History - left heart cath. - laminectomy. - eye surgery. - abdominal hysterectomy. - angioplasty. - tonsillectomy. Medical A Surgical History Notes nd Musculoskeletal Osteoporosis Neurologic Aneurysm Oncologic Lung CA with metastases to bone Objective Constitutional Slightly hypertensive. no acute distress. Vitals Time Taken: 1:58 PM, Height: 65  in, Weight: 165 lbs, BMI: 27.5, Temperature: 98.0 F, Pulse: 67 bpm, Respiratory Rate: 18 breaths/min, Blood Pressure: 142/60 mmHg. Respiratory Normal work of breathing on room air. General Notes: 05/02/2023: The wound is markedly smaller today with just the area at the most cranial aspect remaining open. It is shallower than it was last week and has just a small amount of slough. Edema control remains good. Integumentary (Hair, Skin) Wound #1 status is Open. Original cause of wound was Trauma. The date acquired was: 03/29/2023. The wound has been in treatment 1 weeks. The wound is located on the Right,Lateral Lower Leg. The wound measures 0.4cm length x 0.3cm width x 0.1cm depth; 0.094cm^2 area and 0.009cm^3 volume. There is Fat Layer (Subcutaneous Tissue) exposed. There is a medium amount of serosanguineous drainage noted. The wound margin is distinct with the outline attached to the wound base. There is large (67-100%) red granulation within the wound bed. There is a small (1-33%) amount of necrotic tissue within the wound bed. The periwound skin appearance  Patient Account Number: [id number] Date of Birth/Sex: Treating RN: [birth year] (88 y.o. F) Primary Care Provider: Eloisa Northern Other Clinician: Referring Provider: Treating Provider/Extender: Marena Chancy in Treatment: 1 Information Obtained From Chart Cardiovascular Medical History: Positive for: Coronary Artery Disease; Hypertension Endocrine Medical History: Positive for: Type II Diabetes - states "prediabetic" [name], [name] ([id number]) [phone].pdf Page 7 of 8 Musculoskeletal Medical History: Positive for: Osteoarthritis Past Medical History Notes: Osteoporosis Neurologic Medical History: Past Medical History  Notes: Aneurysm Oncologic Medical History: Past Medical History Notes: Lung CA with metastases to bone Immunizations Pneumococcal Vaccine: Received Pneumococcal Vaccination: No Implantable Devices No devices added Hospitalization / Surgery History Type of Hospitalization/Surgery left heart cath laminectomy eye surgery abdominal hysterectomy angioplasty tonsillectomy Family and Social History Cancer: Yes - Maternal Grandparents; Diabetes: Yes - Mother,Siblings; Heart Disease: Yes - Father; Former smoker - ended on 07/31/1993; Alcohol Use: Never; Drug Use: No History; Caffeine Use: Never; Financial Concerns: No; Food, Clothing or Shelter Needs: No; Support System Lacking: No; Transportation Concerns: No Electronic Signature(s) Signed: 05/02/2023 4:32:07 PM By: [name] Entered By: [name] on 05/02/2023 12:03:43 -------------------------------------------------------------------------------- SuperBill Details Patient Name: Date of Service: [name] 05/02/2023 Medical Record Number: [id number] Patient Account Number: [id number] Date of Birth/Sex: Treating RN: [birth year] (88 y.o. F) Primary Care Provider: Eloisa Northern Other Clinician: Referring Provider: Treating Provider/Extender: Marena Chancy in Treatment: 1 Diagnosis Coding ICD-10 Codes Code Description [phone] Non-pressure chronic ulcer of other part of right lower leg with fat layer exposed E11.622 Type 2 diabetes mellitus with other skin ulcer Facility Procedures : [facility] CPT4 Code: [id number] , Kasandra [name] (0 ICD Description: 97597 - DEBRIDE WOUND 1ST 20 SQ CM OR < [id number]) [id number] -10 Diagnosis Description L97.812 Non-pressure chronic ulcer of other part of right lower leg with fat layer expo Modifier: [id number]_ sed Quantity: 1 51227.pdf Page 8 of 8 Physician Procedures : CPT4 Code Description Modifier [phone] 99213 - WC PHYS LEVEL 3 -  EST PT 25 ICD-10 Diagnosis Description L97.812 Non-pressure chronic ulcer of other part of right lower leg with fat layer exposed E11.622 Type 2 diabetes mellitus with other skin ulcer Quantity: 1 : [id number] 97597 - WC PHYS DEBR WO ANESTH 20 SQ CM ICD-10 Diagnosis Description L97.812 Non-pressure chronic ulcer of other part of right lower leg with fat layer exposed Quantity: 1 Electronic Signature(s) Signed: 05/02/2023 3:05:50 PM By: [name] Entered By: [name] on 05/02/2023 12:05:50  good. Electronic Signature(s) Signed: 05/02/2023 3:03:37 PM By: [name] Entered By: [name] on 05/02/2023 12:03:37 -------------------------------------------------------------------------------- Physical Exam Details Patient Name: Date of Service: [name] 05/02/2023 1:45 PM Medical Record Number: [id number] Patient Account Number: [id number] Date of Birth/Sex: Treating RN: [birth year] (88 y.o. F) Primary Care Provider: Eloisa Northern Other Clinician: Referring Provider: Treating Provider/Extender: Marena Chancy in Treatment: 1 Constitutional Slightly hypertensive. . . . no acute distress. Respiratory Normal work of breathing on room air. Notes 05/02/2023: The wound is markedly smaller today with just the area at the most cranial aspect remaining open. It is shallower than it was last week and has just a small amount of slough. Edema control remains good. [name], [name] ([id number]) [id number] Page 3 of 8 Electronic Signature(s) Signed: 05/02/2023 3:04:04 PM By: [name] Entered By: [name] on 05/02/2023 12:04:04 -------------------------------------------------------------------------------- Physician Orders Details Patient Name: Date of Service: [name] 05/02/2023 1:45 PM Medical Record Number: [id number] Patient Account Number: [id number] Date of Birth/Sex: Treating  RN: 01/05/1935 (88 y.o. [name] Primary Care Provider: Eloisa Northern Other Clinician: Referring Provider: Treating Provider/Extender: Marena Chancy in Treatment: 1 Verbal / Phone Orders: No Diagnosis Coding ICD-10 Coding Code Description [phone] Non-pressure chronic ulcer of other part of right lower leg with fat layer exposed E11.622 Type 2 diabetes mellitus with other skin ulcer Follow-up Appointments ppointment in 1 week. - Dr. Lady Gary - Return A Wednesday 05/09/2023 at 11:15am Anesthetic (In clinic) Topical Lidocaine 4% applied to wound bed Bathing/ Shower/ Hygiene May shower with protection but do not get wound dressing(s) wet. Protect dressing(s) with water repellant cover (for example, large plastic bag) or a cast cover and may then take shower. Edema Control - Lymphedema / SCD / Other Bilateral Lower Extremities Elevate legs to the level of the heart or above for 30 minutes daily and/or when sitting for 3-4 times a day throughout the day. Avoid standing for long periods of time. Exercise regularly Additional Orders / Instructions Follow Nutritious Diet - Increase protein intake or maintain at 70-100g/day. Wound Treatment Wound #1 - Lower Leg Wound Laterality: Right, Lateral Peri-Wound Care: Sween Lotion (Moisturizing lotion) 1 x Per Week Discharge Instructions: Apply moisturizing lotion as directed Prim Dressing: Maxorb Extra Ag+ Alginate Dressing, 4x4.75 (in/in) 1 x Per Week ary Discharge Instructions: Apply to wound bed as instructed Secondary Dressing: Woven Gauze Sponge, Non-Sterile 4x4 in 1 x Per Week Discharge Instructions: Apply over primary dressing as directed. Compression Wrap: Urgo K2 Lite, (equivalent to a 3 layer) two layer compression system, regular 1 x Per Week Discharge Instructions: Apply Urgo K2 Lite as directed (alternative to 3 layer compression). Electronic Signature(s) Signed: 05/02/2023 4:32:07 PM By: [name] Previous Signature: 05/02/2023 2:35:42 PM Version By: Tommie Ard RN Entered By: [name] on 05/02/2023 12:04:20 [name] ([id number]) [id number] Page 4 of 8 -------------------------------------------------------------------------------- Problem List Details Patient Name: Date of Service: [name] 05/02/2023 1:45 PM Medical Record Number: [id number] Patient Account Number: [id number] Date of Birth/Sex: Treating RN: 1934/12/30 (88 y.o. F) Primary Care Provider: Eloisa Northern Other Clinician: Referring Provider: Treating Provider/Extender: Marena Chancy in Treatment: 1 Active Problems ICD-10 Encounter Code Description Active Date MDM Diagnosis [phone] Non-pressure chronic ulcer of other part of right lower leg with fat layer 04/19/2023 No Yes exposed E11.622 Type 2 diabetes mellitus with other  Patient Account Number: [id number] Date of Birth/Sex: Treating RN: [birth year] (88 y.o. F) Primary Care Provider: Eloisa Northern Other Clinician: Referring Provider: Treating Provider/Extender: Marena Chancy in Treatment: 1 Information Obtained From Chart Cardiovascular Medical History: Positive for: Coronary Artery Disease; Hypertension Endocrine Medical History: Positive for: Type II Diabetes - states "prediabetic" [name], [name] ([id number]) [phone].pdf Page 7 of 8 Musculoskeletal Medical History: Positive for: Osteoarthritis Past Medical History Notes: Osteoporosis Neurologic Medical History: Past Medical History  Notes: Aneurysm Oncologic Medical History: Past Medical History Notes: Lung CA with metastases to bone Immunizations Pneumococcal Vaccine: Received Pneumococcal Vaccination: No Implantable Devices No devices added Hospitalization / Surgery History Type of Hospitalization/Surgery left heart cath laminectomy eye surgery abdominal hysterectomy angioplasty tonsillectomy Family and Social History Cancer: Yes - Maternal Grandparents; Diabetes: Yes - Mother,Siblings; Heart Disease: Yes - Father; Former smoker - ended on 07/31/1993; Alcohol Use: Never; Drug Use: No History; Caffeine Use: Never; Financial Concerns: No; Food, Clothing or Shelter Needs: No; Support System Lacking: No; Transportation Concerns: No Electronic Signature(s) Signed: 05/02/2023 4:32:07 PM By: [name] Entered By: [name] on 05/02/2023 12:03:43 -------------------------------------------------------------------------------- SuperBill Details Patient Name: Date of Service: [name] 05/02/2023 Medical Record Number: [id number] Patient Account Number: [id number] Date of Birth/Sex: Treating RN: [birth year] (88 y.o. F) Primary Care Provider: Eloisa Northern Other Clinician: Referring Provider: Treating Provider/Extender: Marena Chancy in Treatment: 1 Diagnosis Coding ICD-10 Codes Code Description [phone] Non-pressure chronic ulcer of other part of right lower leg with fat layer exposed E11.622 Type 2 diabetes mellitus with other skin ulcer Facility Procedures : [facility] CPT4 Code: [id number] , Kasandra [name] (0 ICD Description: 97597 - DEBRIDE WOUND 1ST 20 SQ CM OR < [id number]) [id number] -10 Diagnosis Description L97.812 Non-pressure chronic ulcer of other part of right lower leg with fat layer expo Modifier: [id number]_ sed Quantity: 1 51227.pdf Page 8 of 8 Physician Procedures : CPT4 Code Description Modifier [phone] 99213 - WC PHYS LEVEL 3 -  EST PT 25 ICD-10 Diagnosis Description L97.812 Non-pressure chronic ulcer of other part of right lower leg with fat layer exposed E11.622 Type 2 diabetes mellitus with other skin ulcer Quantity: 1 : [id number] 97597 - WC PHYS DEBR WO ANESTH 20 SQ CM ICD-10 Diagnosis Description L97.812 Non-pressure chronic ulcer of other part of right lower leg with fat layer exposed Quantity: 1 Electronic Signature(s) Signed: 05/02/2023 3:05:50 PM By: [name] Entered By: [name] on 05/02/2023 12:05:50

## 2023-05-02 NOTE — Progress Notes (Addendum)
regimen Date Initiated: 04/19/2023 Target Resolution Date: 05/31/2023 Goal Status: Active Interventions: Assess patient/caregiver ability to obtain necessary supplies Assess patient/caregiver ability to perform ulcer/skin care regimen upon admission and as needed Assess ulceration(s) every visit Provide education on ulcer and skin care Screen for HBO Treatment Activities: Skin care regimen initiated : 04/19/2023 Topical wound management initiated :  04/19/2023 Notes: Electronic Signature(s) Signed: 05/02/2023 2:30:12 PM By: Tommie Ard RN Entered By: Tommie Ard on 05/02/2023 11:30:11 -------------------------------------------------------------------------------- Pain Assessment Details Patient Name: Date of Service: [name]. 05/02/2023 1:45 PM Medical Record Number: [id number] Patient Account Number: [id number] Date of Birth/Sex: Treating RN: [birth year] (88 y.o. [name] Primary Care Nygel Prokop: Eloisa Northern Other Clinician: Referring Joselle Deeds: Treating Audyn Dimercurio/Extender: Marena Chancy in Treatment: 1 Active Problems Location of Pain Severity and Description of Pain Patient Has Paino No Site Locations Pain Management and Medication Current Pain Management: Electronic Signature(s) MARGARETMARY, PRISK ([id number]) [id number] Page 6 of 8 Signed: 05/02/2023 2:35:42 PM By: Tommie Ard RN Entered By: Tommie Ard on 05/02/2023 10:59:05 -------------------------------------------------------------------------------- Patient/Caregiver Education Details Patient Name: Date of Service: [name] 10/2/2024andnbsp1:45 PM Medical Record Number: [id number] Patient Account Number: [id number] Date of Birth/Gender: Treating RN: 23-Dec-1934 (88 y.o. [name] Primary Care Physician: Eloisa Northern Other Clinician: Referring Physician: Treating Physician/Extender: Marena Chancy in Treatment: 1 Education Assessment Education Provided To: Patient Education Topics Provided Wound Debridement: Methods: Explain/Verbal Responses: Reinforcements needed, State content correctly Wound/Skin Impairment: Methods: Explain/Verbal Responses: Reinforcements needed, State content correctly Electronic Signature(s) Signed: 05/02/2023 2:35:42 PM By: Tommie Ard RN Entered By: Tommie Ard on 05/02/2023  11:30:30 -------------------------------------------------------------------------------- Wound Assessment Details Patient Name: Date of Service: [name]. 05/02/2023 1:45 PM Medical Record Number: [id number] Patient Account Number: [id number] Date of Birth/Sex: Treating RN: 06-Apr-1935 (88 y.o. [name] Primary Care Arasely Akkerman: Eloisa Northern Other Clinician: Referring Mackinzie Vuncannon: Treating Cassi Jenne/Extender: Marena Chancy in Treatment: 1 Wound Status Wound Number: 1 Primary Diabetic Wound/Ulcer of the Lower Extremity Etiology: Wound Location: Right, Lateral Lower Leg Wound Status: Open Wounding Event: Trauma Comorbid Coronary Artery Disease, Hypertension, Type II Diabetes, Date Acquired: 03/29/2023 History: Osteoarthritis Weeks Of Treatment: 1 Clustered Wound: No Photos RHIANNE, SOMAN ([id number]) [phone].pdf Page 7 of 8 Wound Measurements Length: (cm) 0.4 Width: (cm) 0.3 Depth: (cm) 0.1 Area: (cm) 0.094 Volume: (cm) 0.009 % Reduction in Area: 95.4% % Reduction in Volume: 95.6% Epithelialization: Small (1-33%) Wound Description Classification: Grade 1 Wound Margin: Distinct, outline attached Exudate Amount: Medium Exudate Type: Serosanguineous Exudate Color: red, brown Foul Odor After Cleansing: No Slough/Fibrino Yes Wound Bed Granulation Amount: Large (67-100%) Exposed Structure Granulation Quality: Red Fat Layer (Subcutaneous Tissue) Exposed: Yes Necrotic Amount: Small (1-33%) Periwound Skin Texture Texture Color No Abnormalities Noted: Yes No Abnormalities Noted: Yes Moisture Temperature / Pain No Abnormalities Noted: Yes Temperature: No Abnormality Tenderness on Palpation: Yes Treatment Notes Wound #1 (Lower Leg) Wound Laterality: Right, Lateral Cleanser Peri-Wound Care Sween Lotion (Moisturizing lotion) Discharge Instruction: Apply moisturizing lotion as  directed Topical Primary Dressing Maxorb Extra Ag+ Alginate Dressing, 4x4.75 (in/in) Discharge Instruction: Apply to wound bed as instructed Secondary Dressing Woven Gauze Sponge, Non-Sterile 4x4 in Discharge Instruction: Apply over primary dressing as directed. Secured With Compression Wrap Urgo K2 Lite, (equivalent to a 3 layer) two layer compression system, regular Discharge Instruction: Apply Urgo K2 Lite as directed (alternative to 3 layer compression). Compression Stockings Add-Ons Electronic Signature(s)  regimen Date Initiated: 04/19/2023 Target Resolution Date: 05/31/2023 Goal Status: Active Interventions: Assess patient/caregiver ability to obtain necessary supplies Assess patient/caregiver ability to perform ulcer/skin care regimen upon admission and as needed Assess ulceration(s) every visit Provide education on ulcer and skin care Screen for HBO Treatment Activities: Skin care regimen initiated : 04/19/2023 Topical wound management initiated :  04/19/2023 Notes: Electronic Signature(s) Signed: 05/02/2023 2:30:12 PM By: Tommie Ard RN Entered By: Tommie Ard on 05/02/2023 11:30:11 -------------------------------------------------------------------------------- Pain Assessment Details Patient Name: Date of Service: [name]. 05/02/2023 1:45 PM Medical Record Number: [id number] Patient Account Number: [id number] Date of Birth/Sex: Treating RN: [birth year] (88 y.o. [name] Primary Care Nygel Prokop: Eloisa Northern Other Clinician: Referring Joselle Deeds: Treating Audyn Dimercurio/Extender: Marena Chancy in Treatment: 1 Active Problems Location of Pain Severity and Description of Pain Patient Has Paino No Site Locations Pain Management and Medication Current Pain Management: Electronic Signature(s) MARGARETMARY, PRISK ([id number]) [id number] Page 6 of 8 Signed: 05/02/2023 2:35:42 PM By: Tommie Ard RN Entered By: Tommie Ard on 05/02/2023 10:59:05 -------------------------------------------------------------------------------- Patient/Caregiver Education Details Patient Name: Date of Service: [name] 10/2/2024andnbsp1:45 PM Medical Record Number: [id number] Patient Account Number: [id number] Date of Birth/Gender: Treating RN: 23-Dec-1934 (88 y.o. [name] Primary Care Physician: Eloisa Northern Other Clinician: Referring Physician: Treating Physician/Extender: Marena Chancy in Treatment: 1 Education Assessment Education Provided To: Patient Education Topics Provided Wound Debridement: Methods: Explain/Verbal Responses: Reinforcements needed, State content correctly Wound/Skin Impairment: Methods: Explain/Verbal Responses: Reinforcements needed, State content correctly Electronic Signature(s) Signed: 05/02/2023 2:35:42 PM By: Tommie Ard RN Entered By: Tommie Ard on 05/02/2023  11:30:30 -------------------------------------------------------------------------------- Wound Assessment Details Patient Name: Date of Service: [name]. 05/02/2023 1:45 PM Medical Record Number: [id number] Patient Account Number: [id number] Date of Birth/Sex: Treating RN: 06-Apr-1935 (88 y.o. [name] Primary Care Arasely Akkerman: Eloisa Northern Other Clinician: Referring Mackinzie Vuncannon: Treating Cassi Jenne/Extender: Marena Chancy in Treatment: 1 Wound Status Wound Number: 1 Primary Diabetic Wound/Ulcer of the Lower Extremity Etiology: Wound Location: Right, Lateral Lower Leg Wound Status: Open Wounding Event: Trauma Comorbid Coronary Artery Disease, Hypertension, Type II Diabetes, Date Acquired: 03/29/2023 History: Osteoarthritis Weeks Of Treatment: 1 Clustered Wound: No Photos RHIANNE, SOMAN ([id number]) [phone].pdf Page 7 of 8 Wound Measurements Length: (cm) 0.4 Width: (cm) 0.3 Depth: (cm) 0.1 Area: (cm) 0.094 Volume: (cm) 0.009 % Reduction in Area: 95.4% % Reduction in Volume: 95.6% Epithelialization: Small (1-33%) Wound Description Classification: Grade 1 Wound Margin: Distinct, outline attached Exudate Amount: Medium Exudate Type: Serosanguineous Exudate Color: red, brown Foul Odor After Cleansing: No Slough/Fibrino Yes Wound Bed Granulation Amount: Large (67-100%) Exposed Structure Granulation Quality: Red Fat Layer (Subcutaneous Tissue) Exposed: Yes Necrotic Amount: Small (1-33%) Periwound Skin Texture Texture Color No Abnormalities Noted: Yes No Abnormalities Noted: Yes Moisture Temperature / Pain No Abnormalities Noted: Yes Temperature: No Abnormality Tenderness on Palpation: Yes Treatment Notes Wound #1 (Lower Leg) Wound Laterality: Right, Lateral Cleanser Peri-Wound Care Sween Lotion (Moisturizing lotion) Discharge Instruction: Apply moisturizing lotion as  directed Topical Primary Dressing Maxorb Extra Ag+ Alginate Dressing, 4x4.75 (in/in) Discharge Instruction: Apply to wound bed as instructed Secondary Dressing Woven Gauze Sponge, Non-Sterile 4x4 in Discharge Instruction: Apply over primary dressing as directed. Secured With Compression Wrap Urgo K2 Lite, (equivalent to a 3 layer) two layer compression system, regular Discharge Instruction: Apply Urgo K2 Lite as directed (alternative to 3 layer compression). Compression Stockings Add-Ons Electronic Signature(s)  MARRY, KUSCH ([id number]) [id number] Page 1 of 8 Visit Report for 05/02/2023 Arrival Information Details Patient Name: Date of Service: [name] 05/02/2023 1:45 PM Medical Record Number: [id number] Patient Account Number: [id number] Date of Birth/Sex: Treating RN: 29-Apr-1935 (88 y.o. Roselee Nova, Jamie Primary Care Jonel Weldon: Eloisa Northern Other Clinician: Referring Karnisha Lefebre: Treating Verl Kitson/Extender: Marena Chancy in Treatment: 1 Visit Information History Since Last Visit Added or deleted any medications: No Patient Arrived: Dan Humphreys Any new allergies or adverse reactions: No Arrival Time: 13:58 Had a fall or experienced change in No Accompanied By: daughter activities of daily living that may affect Transfer Assistance: None risk of falls: Patient Requires Transmission-Based Precautions: No Signs or symptoms of abuse/neglect since last visito No Patient Has Alerts: No Hospitalized since last visit: No Implantable device outside of the clinic excluding No cellular tissue based products placed in the center since last visit: Has Compression in Place as Prescribed: Yes Pain Present Now: No Electronic Signature(s) Signed: 05/02/2023 2:35:42 PM By: Tommie Ard RN Entered By: Tommie Ard on 05/02/2023 10:58:42 -------------------------------------------------------------------------------- Compression Therapy Details Patient Name: Date of Service: [name]. 05/02/2023 1:45 PM Medical Record Number: [id number] Patient Account Number: [id number] Date of Birth/Sex: Treating RN: 05-02-1935 (88 y.o. [name] Primary Care Malerie Eakins: Eloisa Northern Other Clinician: Referring Duward Allbritton: Treating Tory Septer/Extender: Marena Chancy in Treatment: 1 Compression Therapy Performed for Wound Assessment: Wound #1 Right,Lateral Lower Leg Performed By: Clinician Tommie Ard,  RN Compression Type: Three Layer Post Procedure Diagnosis Same as Pre-procedure Electronic Signature(s) Signed: 05/02/2023 2:35:42 PM By: Tommie Ard RN Entered By: Tommie Ard on 05/02/2023 11:10:14 [facility] ([id number]) [id number] Page 2 of 8 -------------------------------------------------------------------------------- Encounter Discharge Information Details Patient Name: Date of Service: [name] 05/02/2023 1:45 PM Medical Record Number: [id number] Patient Account Number: [id number] Date of Birth/Sex: Treating RN: 07/01/1935 (88 y.o. [name] Primary Care Rhet Rorke: Eloisa Northern Other Clinician: Referring Matina Rodier: Treating Shann Merrick/Extender: Marena Chancy in Treatment: 1 Encounter Discharge Information Items Post Procedure Vitals Discharge Condition: Stable Temperature (F): 98.0 Ambulatory Status: Walker Pulse (bpm): 67 Discharge Destination: Home Respiratory Rate (breaths/min): 18 Transportation: Private Auto Blood Pressure (mmHg): 142/60 Accompanied By: daughter Schedule Follow-up Appointment: Yes Clinical Summary of Care: Electronic Signature(s) Signed: 05/02/2023 2:33:31 PM By: Tommie Ard RN Entered By: Tommie Ard on 05/02/2023 11:33:31 -------------------------------------------------------------------------------- Lower Extremity Assessment Details Patient Name: Date of Service: [name] 05/02/2023 1:45 PM Medical Record Number: [id number] Patient Account Number: [id number] Date of Birth/Sex: Treating RN: Jan 12, 1935 (88 y.o. [name] Primary Care Jerzy Crotteau: Eloisa Northern Other Clinician: Referring Breklyn Fabrizio: Treating Dorethy Tomey/Extender: Marena Chancy in Treatment: 1 Edema Assessment Assessed: [Left: No] [Right: No] [Left: Edema] [Right: :] Calf Left: Right: Point of Measurement: From Medial Instep 36  cm Ankle Left: Right: Point of Measurement: From Medial Instep 20.1 cm Vascular Assessment Pulses: Dorsalis Pedis Palpable: [Right:Yes] Extremity colors, hair growth, and conditions: Extremity Color: [Right:Normal] Hair Growth on Extremity: [Right:No] Temperature of Extremity: [Right:Warm] Capillary Refill: [Right:< 3 seconds] Dependent Rubor: [Right:No No] Electronic Signature(s) Signed: 05/02/2023 2:35:42 PM By: Tommie Ard RN Entered By: Tommie Ard on 05/02/2023 11:05:16 [facility] ([id number]) [id number] Page 3 of 8 -------------------------------------------------------------------------------- Multi Wound Chart Details Patient Name: Date of Service: [name] 05/02/2023 1:45 PM Medical Record Number: [id number] Patient Account Number: [id number] Date of  regimen Date Initiated: 04/19/2023 Target Resolution Date: 05/31/2023 Goal Status: Active Interventions: Assess patient/caregiver ability to obtain necessary supplies Assess patient/caregiver ability to perform ulcer/skin care regimen upon admission and as needed Assess ulceration(s) every visit Provide education on ulcer and skin care Screen for HBO Treatment Activities: Skin care regimen initiated : 04/19/2023 Topical wound management initiated :  04/19/2023 Notes: Electronic Signature(s) Signed: 05/02/2023 2:30:12 PM By: Tommie Ard RN Entered By: Tommie Ard on 05/02/2023 11:30:11 -------------------------------------------------------------------------------- Pain Assessment Details Patient Name: Date of Service: [name]. 05/02/2023 1:45 PM Medical Record Number: [id number] Patient Account Number: [id number] Date of Birth/Sex: Treating RN: [birth year] (88 y.o. [name] Primary Care Nygel Prokop: Eloisa Northern Other Clinician: Referring Joselle Deeds: Treating Audyn Dimercurio/Extender: Marena Chancy in Treatment: 1 Active Problems Location of Pain Severity and Description of Pain Patient Has Paino No Site Locations Pain Management and Medication Current Pain Management: Electronic Signature(s) MARGARETMARY, PRISK ([id number]) [id number] Page 6 of 8 Signed: 05/02/2023 2:35:42 PM By: Tommie Ard RN Entered By: Tommie Ard on 05/02/2023 10:59:05 -------------------------------------------------------------------------------- Patient/Caregiver Education Details Patient Name: Date of Service: [name] 10/2/2024andnbsp1:45 PM Medical Record Number: [id number] Patient Account Number: [id number] Date of Birth/Gender: Treating RN: 23-Dec-1934 (88 y.o. [name] Primary Care Physician: Eloisa Northern Other Clinician: Referring Physician: Treating Physician/Extender: Marena Chancy in Treatment: 1 Education Assessment Education Provided To: Patient Education Topics Provided Wound Debridement: Methods: Explain/Verbal Responses: Reinforcements needed, State content correctly Wound/Skin Impairment: Methods: Explain/Verbal Responses: Reinforcements needed, State content correctly Electronic Signature(s) Signed: 05/02/2023 2:35:42 PM By: Tommie Ard RN Entered By: Tommie Ard on 05/02/2023  11:30:30 -------------------------------------------------------------------------------- Wound Assessment Details Patient Name: Date of Service: [name]. 05/02/2023 1:45 PM Medical Record Number: [id number] Patient Account Number: [id number] Date of Birth/Sex: Treating RN: 06-Apr-1935 (88 y.o. [name] Primary Care Arasely Akkerman: Eloisa Northern Other Clinician: Referring Mackinzie Vuncannon: Treating Cassi Jenne/Extender: Marena Chancy in Treatment: 1 Wound Status Wound Number: 1 Primary Diabetic Wound/Ulcer of the Lower Extremity Etiology: Wound Location: Right, Lateral Lower Leg Wound Status: Open Wounding Event: Trauma Comorbid Coronary Artery Disease, Hypertension, Type II Diabetes, Date Acquired: 03/29/2023 History: Osteoarthritis Weeks Of Treatment: 1 Clustered Wound: No Photos RHIANNE, SOMAN ([id number]) [phone].pdf Page 7 of 8 Wound Measurements Length: (cm) 0.4 Width: (cm) 0.3 Depth: (cm) 0.1 Area: (cm) 0.094 Volume: (cm) 0.009 % Reduction in Area: 95.4% % Reduction in Volume: 95.6% Epithelialization: Small (1-33%) Wound Description Classification: Grade 1 Wound Margin: Distinct, outline attached Exudate Amount: Medium Exudate Type: Serosanguineous Exudate Color: red, brown Foul Odor After Cleansing: No Slough/Fibrino Yes Wound Bed Granulation Amount: Large (67-100%) Exposed Structure Granulation Quality: Red Fat Layer (Subcutaneous Tissue) Exposed: Yes Necrotic Amount: Small (1-33%) Periwound Skin Texture Texture Color No Abnormalities Noted: Yes No Abnormalities Noted: Yes Moisture Temperature / Pain No Abnormalities Noted: Yes Temperature: No Abnormality Tenderness on Palpation: Yes Treatment Notes Wound #1 (Lower Leg) Wound Laterality: Right, Lateral Cleanser Peri-Wound Care Sween Lotion (Moisturizing lotion) Discharge Instruction: Apply moisturizing lotion as  directed Topical Primary Dressing Maxorb Extra Ag+ Alginate Dressing, 4x4.75 (in/in) Discharge Instruction: Apply to wound bed as instructed Secondary Dressing Woven Gauze Sponge, Non-Sterile 4x4 in Discharge Instruction: Apply over primary dressing as directed. Secured With Compression Wrap Urgo K2 Lite, (equivalent to a 3 layer) two layer compression system, regular Discharge Instruction: Apply Urgo K2 Lite as directed (alternative to 3 layer compression). Compression Stockings Add-Ons Electronic Signature(s)

## 2023-05-04 ENCOUNTER — Ambulatory Visit (HOSPITAL_COMMUNITY): Payer: Medicare Other

## 2023-05-09 ENCOUNTER — Encounter (HOSPITAL_BASED_OUTPATIENT_CLINIC_OR_DEPARTMENT_OTHER): Payer: Medicare Other | Admitting: General Surgery

## 2023-05-09 DIAGNOSIS — E11622 Type 2 diabetes mellitus with other skin ulcer: Secondary | ICD-10-CM | POA: Diagnosis not present

## 2023-05-09 NOTE — Progress Notes (Signed)
bed, cushion, seat, etc.) INTERVENTIONS - Wound Cleansing / Measurement X - Simple Wound Cleansing - one wound 1 5 []  - 0 Complex Wound Cleansing - multiple wounds X- 1 5 Wound Imaging (photographs - any number of wounds) []  - 0 Wound Tracing (instead of photographs) []  - 0 Simple Wound Measurement - one wound []  - 0 Complex Wound Measurement - multiple wounds INTERVENTIONS - Wound Dressings []  - 0 Small Wound Dressing one or multiple wounds []  - 0 Medium Wound Dressing one or multiple wounds []  - 0 Large Wound Dressing one or multiple wounds []  - 0 Application of Medications - topical []  - 0 Application of Medications - injection INTERVENTIONS - Miscellaneous []  - 0 External ear exam []  - 0 Specimen Collection (cultures, biopsies, blood, body fluids, etc.) []  - 0 Specimen(s) / Culture(s) sent or taken to Lab for analysis []  - 0 Patient Transfer (multiple staff / Nurse, adult / Similar devices) []  - 0 Simple Staple / Suture removal (25 or less) []  - 0 Complex Staple / Suture removal (26 or more) []  - 0 Hypo / Hyperglycemic Management (close monitor of Blood Glucose) LEXANY, BELKNAP C ([id number]) [id number] Page 3 of 8 []  - 0 Ankle / Brachial Index (ABI) - do not check if billed separately X- 1 5 Vital Signs Has the patient been seen at the hospital within the last three years: Yes Total Score: 80 Level Of Care: New/Established - Level 3 Electronic Signature(s) Signed: 05/09/2023 4:30:25 PM By: Zenaida Deed RN, BSN Entered By: Zenaida Deed on 05/09/2023 08:39:27 -------------------------------------------------------------------------------- Encounter Discharge Information Details Patient Name: Date of Service: Tina Meyer C. 05/09/2023 11:15 A M Medical Record Number: [id number] Patient Account Number: [id number] Date of Birth/Sex: Treating RN: [birth year] (88 y.o. [name] Primary Care Ghadeer Kastelic: Eloisa Northern Other Clinician: Referring Gennesis Hogland: Treating Kairav Russomanno/Extender: Marena Chancy in [name]: 2 Encounter Discharge Information Items Discharge Condition: Stable Ambulatory Status: Walker Discharge Destination: Home Transportation: Private Auto Accompanied By: daughter Schedule Follow-up Appointment: Yes Clinical Summary of Care: Patient Declined Electronic Signature(s) Signed: 05/09/2023 4:30:25 PM By: Zenaida Deed RN, BSN Entered By: Zenaida Deed on 05/09/2023 08:40:11 -------------------------------------------------------------------------------- Lower Extremity Assessment Details Patient Name: Date of Service: Tina Meyer C. 05/09/2023 11:15 A M Medical Record Number: [id number] Patient Account Number: [id number] Date of Birth/Sex: Treating RN: [birth year] (88 y.o. [name] Primary Care Ansen Sayegh: Eloisa Northern Other Clinician: Referring Emmanuela Ghazi: Treating Rilyn Scroggs/Extender: Marena Chancy in [name]: 2 Edema Assessment Assessed: [Left: No] [Right: No] [Left: Edema] [Right: :] Calf Left: Right: Point of Measurement: From Medial Instep 34.5 cm Ankle Left: Right: Point of Measurement: From Medial Instep 21 cm Vascular Assessment Left: [[id number] Page 4 of 8Right:] Pulses: Dorsalis Pedis Palpable: [[id number] Page 4 of 8Yes] Extremity colors, hair growth, and conditions: Extremity Color: [[id number] Page 4 of 8Normal] Hair Growth on Extremity: [[id number] Page 4 of 8No] Temperature of Extremity: [[id number] Page 4 of 8Warm] Capillary Refill:  [id number] Page 4 of 8< 3 seconds] Dependent Rubor: [[id number] Page 4 of 8No No] Electronic Signature(s) Signed: 05/09/2023 4:30:25 PM By: Zenaida Deed RN, BSN Entered By: Zenaida Deed on 05/09/2023 08:22:18 -------------------------------------------------------------------------------- Multi Wound Chart Details Patient Name: Date of Service: Tina Boroughs. 05/09/2023 11:15 A M Medical Record Number: [id number] Patient Account Number: [id number] Date of Birth/Sex: Treating RN: 12-11-1934 (88 y.o. F) Primary Care Luster Hechler: Eloisa Northern Other Clinician: Referring Pricila Bridge: Treating Takyia Sindt/Extender: [name]  [name], [name] ([id number]) [id number] Page 1 of 8 Visit Report for 05/09/2023 Arrival Information Details Patient Name: Date of Service: Tina Boroughs 05/09/2023 11:15 A M Medical Record Number: [id number] Patient Account Number: [id number] Date of Birth/Sex: Treating RN: 08-09-1934 (88 y.o. [name] Primary Care Rheanna Sergent: Eloisa Northern Other Clinician: Referring Sanuel Ladnier: Treating Paizley Ramella/Extender: Marena Chancy in [name]: 2 Visit Information History Since Last Visit Added or deleted any medications: No Patient Arrived: Dan Humphreys Any new allergies or adverse reactions: No Arrival Time: 11:17 Had a fall or experienced change in No Accompanied By: daughter activities of daily living that may affect Transfer Assistance: None risk of falls: Patient Identification Verified: Yes Signs or symptoms of abuse/neglect since last visito No Secondary Verification Process Completed: Yes Hospitalized since last visit: No Patient Requires Transmission-Based Precautions: No Implantable device outside of the clinic excluding No Patient Has Alerts: No cellular tissue based products placed in the center since last visit: Has Dressing in Place as Prescribed: Yes Has Compression in Place as Prescribed: Yes Pain Present Now: Yes Electronic Signature(s) Signed: 05/09/2023 4:30:25 PM By: Zenaida Deed RN, BSN Entered By: Zenaida Deed on 05/09/2023 08:17:47 -------------------------------------------------------------------------------- Clinic Level of Care Assessment Details Patient Name: Date of Service: Tina Meyer C. 05/09/2023 11:15 A M Medical Record Number: [id number] Patient Account Number: [id number] Date of Birth/Sex: Treating RN: 03-22-1935 (88 y.o. [name] Primary Care Kizzi Overbey: Eloisa Northern Other Clinician: Referring Tabor Bartram: Treating Donnie Gedeon/Extender: Marena Chancy in [name]: 2 Clinic Level of Care Assessment Items TOOL 4 Quantity Score []  - 0 Use when only an EandM is performed on FOLLOW-UP visit ASSESSMENTS - Nursing Assessment / Reassessment X- 1 10 Reassessment of Co-morbidities (includes updates in patient status) X- 1 5 Reassessment of Adherence to [name] Plan ASSESSMENTS - Wound and Skin A ssessment / Reassessment X - Simple Wound Assessment / Reassessment - one wound 1 5 []  - 0 Complex Wound Assessment / Reassessment - multiple wounds []  - 0 Dermatologic / Skin Assessment (not related to wound area) ASSESSMENTS - Focused Assessment X- 1 5 Circumferential Edema Measurements - multi extremities []  - 0 Nutritional Assessment / Counseling / Intervention [name], [name] ([id number]) [id number] Page 2 of 8 X- 1 5 Lower Extremity Assessment (monofilament, tuning fork, pulses) []  - 0 Peripheral Arterial Disease Assessment (using hand held doppler) ASSESSMENTS - Ostomy and/or Continence Assessment and Care []  - 0 Incontinence Assessment and Management []  - 0 Ostomy Care Assessment and Management (repouching, etc.) PROCESS - Coordination of Care X - Simple Patient / Family Education for ongoing care 1 15 []  - 0 Complex (extensive) Patient / Family Education for ongoing care X- 1 10 Staff obtains Chiropractor, Records, T Results / Process Orders est []  - 0 Staff telephones HHA, Nursing Homes / Clarify orders / etc []  - 0 Routine Transfer to another Facility (non-emergent condition) []  - 0 Routine Hospital Admission (non-emergent condition) []  - 0 New Admissions / Manufacturing engineer / Ordering NPWT Apligraf, etc. , []  - 0 Emergency Hospital Admission (emergent condition) X- 1 10 Simple Discharge Coordination []  - 0 Complex (extensive) Discharge Coordination PROCESS - Special Needs []  - 0 Pediatric / Minor Patient Management []  - 0 Isolation Patient Management []  -  0 Hearing / Language / Visual special needs []  - 0 Assessment of Community assistance (transportation, D/C planning, etc.) []  - 0 Additional assistance / Altered mentation []  - 0 Support Surface(s) Assessment (  State content correctly Wound/Skin Impairment: Methods: Explain/Verbal Responses: Reinforcements needed, State content correctly [name], [name] ([id number]) [id number] Page 7 of 8 Electronic Signature(s) Signed: 05/09/2023 4:30:25 PM By: Zenaida Deed RN, BSN Entered By: Zenaida Deed on 05/09/2023 08:30:25 -------------------------------------------------------------------------------- Wound Assessment Details Patient Name: Date of Service: Tina Boroughs. 05/09/2023 11:15 A M Medical Record Number:  [id number] Patient Account Number: [id number] Date of Birth/Sex: Treating RN: 06-22-1935 (88 y.o. [name] Primary Care Berish Bohman: Eloisa Northern Other Clinician: Referring Rickayla Wieland: Treating Omega Durante/Extender: Marena Chancy in [name]: 2 Wound Status Wound Number: 1 Primary Diabetic Wound/Ulcer of the Lower Extremity Etiology: Wound Location: Right, Lateral Lower Leg Wound Status: Healed - Epithelialized Wounding Event: Trauma Comorbid Coronary Artery Disease, Hypertension, Type II Diabetes, Date Acquired: 03/29/2023 History: Osteoarthritis Weeks Of [name]: 2 Clustered Wound: No Photos Wound Measurements Length: (cm) Width: (cm) Depth: (cm) Area: (cm) Volume: (cm) 0 % Reduction in Area: 100% 0 % Reduction in Volume: 100% 0 Epithelialization: Large (67-100%) 0 Tunneling: No 0 Undermining: No Wound Description Classification: Grade 1 Wound Margin: Distinct, outline attached Exudate Amount: Small Exudate Type: Serosanguineous Exudate Color: red, brown Foul Odor After Cleansing: No Slough/Fibrino No Wound Bed Granulation Amount: Small (1-33%) Exposed Structure Granulation Quality: Pink Fat Layer (Subcutaneous Tissue) Exposed: Yes Necrotic Amount: None Present (0%) Periwound Skin Texture Texture Color No Abnormalities Noted: Yes No Abnormalities Noted: Yes Moisture Temperature / Pain No Abnormalities Noted: Yes Temperature: No Abnormality Electronic Signature(s) Signed: 05/09/2023 4:30:25 PM By: Zenaida Deed RN, BSN Entered By: Zenaida Deed on 05/09/2023 08:33:49 [name] ([id number]) [id number] Page 8 of 8 -------------------------------------------------------------------------------- Vitals Details Patient Name: Date of Service: Tina Boroughs 05/09/2023 11:15 A M Medical Record Number: [id number] Patient Account Number: [id number] Date of Birth/Sex: Treating  RN: [birth year] (88 y.o. [name] Primary Care Reford Olliff: Eloisa Northern Other Clinician: Referring Mindy Gali: Treating Meriam Chojnowski/Extender: Marena Chancy in [name]: 2 Vital Signs Time Taken: 11:17 Temperature (F): 97.8 Height (in): 65 Pulse (bpm): 65 Weight (lbs): 165 Respiratory Rate (breaths/min): 18 Body Mass Index (BMI): 27.5 Blood Pressure (mmHg): 153/66 Reference Range: 80 - 120 mg / dl Electronic Signature(s) Signed: 05/09/2023 4:30:25 PM By: Zenaida Deed RN, BSN Entered By: Zenaida Deed on 05/09/2023 08:18:10  [name]: 2 Vital Signs Height(in): 65 Pulse(bpm): 65 Weight(lbs): 165 Blood Pressure(mmHg): 153/66 Body Mass Index(BMI): 27.5 Temperature(F): 97.8 Respiratory Rate(breaths/min): 18 [1:Photos:] [N/A:N/A] Right, Lateral Lower Leg N/A N/A Wound Location: Trauma N/A N/A Wounding Event: Diabetic Wound/Ulcer of the Lower N/A N/A Primary Etiology: Extremity Coronary Artery Disease, N/A N/A Comorbid History: Hypertension, Type II Diabetes, Osteoarthritis 03/29/2023 N/A N/A Date Acquired: 2 N/A N/A Weeks of [name]: Healed - Epithelialized N/A N/A Wound Status: No N/A N/A Wound Recurrence: 0x0x0 N/A N/A Measurements L x W x D (cm) 0 N/A N/A A (cm) : rea 0 N/A N/A Volume (cm) : 100.00% N/A N/A % Reduction in A rea: 100.00% N/A N/A % Reduction in Volume: Grade 1 N/A N/A Classification: Small N/A N/A Exudate A mount: Serosanguineous N/A N/A Exudate Type: red, brown N/A N/A Exudate Color: Distinct, outline attached N/A N/A Wound Margin: Small (1-33%) N/A N/A Granulation A mount: Pink N/A N/A Granulation Quality: None Present (0%) N/A N/A Necrotic A mount: Fat Layer (Subcutaneous Tissue): Yes N/A N/A Exposed Structures: Large (67-100%) N/A  N/A EpithelializationARIANNY, [name] ([id number]) [id number] Page 5 of 8 Scarring: No N/A N/A Periwound Skin Texture: No Abnormalities Noted N/A N/A Periwound Skin Moisture: Hemosiderin Staining: No N/A N/A Periwound Skin Color: No Abnormality N/A N/A Temperature: [name] Notes Electronic Signature(s) Signed: 05/09/2023 11:46:13 AM By: [name] Entered By: [name] on 05/09/2023 08:46:12 -------------------------------------------------------------------------------- Multi-Disciplinary Care Plan Details Patient Name: Date of Service: Tina Meyer C. 05/09/2023 11:15 A M Medical Record Number: [id number] Patient Account Number: [id number] Date of Birth/Sex: Treating RN: 1935-02-24 (88 y.o. [name] Primary Care Lakyn Alsteen: Eloisa Northern Other Clinician: Referring Korbin Notaro: Treating Serenna Deroy/Extender: Marena Chancy in [name]: 2 Multidisciplinary Care Plan reviewed with physician Active Inactive Electronic Signature(s) Signed: 05/09/2023 4:30:25 PM By: Zenaida Deed RN, BSN Entered By: Zenaida Deed on 05/09/2023 08:40:50 -------------------------------------------------------------------------------- Pain Assessment Details Patient Name: Date of Service: Tina Boroughs. 05/09/2023 11:15 A M Medical Record Number: [id number] Patient Account Number: [id number] Date of Birth/Sex: Treating RN: Mar 08, 1935 (88 y.o. [name] Primary Care Azrael Huss: Eloisa Northern Other Clinician: Referring Jayd Forrey: Treating Kensie Susman/Extender: Marena Chancy in [name]: 2 Active Problems Location of Pain Severity and Description of Pain Patient Has Paino Yes Site Locations Pain LocationDAMYIA, [name] ([id number]) [id number] Page 6 of 8 Pain Location: Generalized Pain With Dressing Change: No Duration of the  Pain. Constant / Intermittento Constant Rate the pain. Current Pain Level: 8 Worst Pain Level: 10 Character of Pain Describe the Pain: Aching Pain Management and Medication Current Pain Management: Medication: Yes Is the Current Pain Management Adequate: Adequate How does your wound impact your activities of daily livingo Sleep: Yes Bathing: No Appetite: No Relationship With Others: No Bladder Continence: No Emotions: Yes Bowel Continence: No Work: No Toileting: No Drive: No Dressing: No Hobbies: Yes Notes reports chronic left hip pain Electronic Signature(s) Signed: 05/09/2023 4:30:25 PM By: Zenaida Deed RN, BSN Entered By: Zenaida Deed on 05/09/2023 08:19:01 -------------------------------------------------------------------------------- Patient/Caregiver Education Details Patient Name: Date of Service: Tina Boroughs 10/9/2024andnbsp11:15 A M Medical Record Number: [id number] Patient Account Number: [id number] Date of Birth/Gender: Treating RN: May 18, 1935 (88 y.o. [name] Primary Care Physician: Eloisa Northern Other Clinician: Referring Physician: Treating Physician/Extender: Marena Chancy in [name]: 2 Education Assessment Education Provided To: Patient Education Topics Provided Venous: Methods: Explain/Verbal Responses: Reinforcements needed,

## 2023-05-09 NOTE — Progress Notes (Signed)
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 no acute distress. Vitals Time Taken: 11:17 AM, Height: 65 in, Weight: 165 lbs, BMI: 27.5, Temperature: 97.8 F, Pulse: 65 bpm, Respiratory Rate: 18 breaths/min, Blood Pressure: 153/66 mmHg. Respiratory Normal work of breathing on room air. General Notes: 05/09/2023: Her wound is healed. Integumentary (Hair, Skin) Wound #1 status is Healed - Epithelialized. Original cause of wound was Trauma. The date acquired was: 03/29/2023. The wound has been in treatment 2 weeks. The wound is located on the Right,Lateral Lower Leg. The wound measures 0cm length x 0cm width x 0cm depth; 0cm^2 area and 0cm^3 volume. There is Fat Layer (Subcutaneous Tissue) exposed. There is no tunneling or undermining noted. There is a small amount of serosanguineous drainage noted. The wound margin is distinct with the outline attached to the wound base. There is small (1-33%) pink granulation within the wound bed. There is no necrotic tissue within the wound bed. The periwound skin appearance had no abnormalities noted for texture. The periwound skin appearance had no  abnormalities noted for moisture. The periwound skin appearance had no abnormalities noted for color. Periwound temperature was noted as No Abnormality. Assessment Active Problems ICD-10 Non-pressure chronic ulcer of other part of right lower leg with fat layer exposed Type 2 diabetes mellitus with other skin ulcer Plan Discharge From [facility] Services: Discharge from Wound Care Center - Congratulations! Bathing/ Shower/ Hygiene: May shower and wash wound with soap and water. DAVANEE, KLINKNER ([id number]) [id number] Page 5 of 6 Edema Control - Lymphedema / SCD / Other: Elevate legs to the level of the heart or above for 30 minutes daily and/or when sitting for 3-4 times a day throughout the day. Avoid standing for long periods of time. Patient to wear own compression stockings every day. Exercise regularly Additional Orders / Instructions: Follow Nutritious Diet - Increase protein intake or maintain at 70-100g/day. 05/09/2023: Her wound is healed. I have recommended that she wear compression stockings daily and try to keep her legs elevated is much as possible during the day and at night while she sleeps. We will discharge her from the wound care center. She may follow-up as needed in the future. Electronic Signature(s) Signed: 05/09/2023 11:50:09 AM By: [name] Entered By: [name] on 05/09/2023 08:50:09 -------------------------------------------------------------------------------- HxROS Details Patient Name: Date of Service: [name]. 05/09/2023 11:15 A M Medical Record Number: [id number] Patient Account Number: [id number] Date of Birth/Sex: Treating RN: [birth year] (88 y.o. F) Primary Care Provider: Eloisa [name] Other Clinician: Referring Provider: Treating Provider/Extender: Marena Chancy in Treatment: 2 Information Obtained From Chart Cardiovascular Medical History: Positive for:  Coronary Artery Disease; Hypertension Endocrine Medical History: Positive for: Type II Diabetes - states "prediabetic" Musculoskeletal Medical History: Positive for: Osteoarthritis Past Medical History Notes: Osteoporosis Neurologic Medical History: Past Medical History Notes: Aneurysm Oncologic Medical History: Past Medical History Notes: Lung CA with metastases to bone Immunizations Pneumococcal Vaccine: Received Pneumococcal Vaccination: No Implantable Devices No devices added Hospitalization / Surgery History Type of Hospitalization/Surgery [name], Tina ([id number]) [id number] Page 6 of 6 left heart cath laminectomy eye surgery abdominal hysterectomy angioplasty tonsillectomy Family and Social History Cancer: Yes - Maternal Grandparents; Diabetes: Yes - Mother,Siblings; Heart Disease: Yes - Father; Former smoker - ended on 07/31/1993; Alcohol Use: Never; Drug Use: No History; Caffeine Use: Never; Financial Concerns: No; Food, Clothing or Shelter Needs: No; Support System Lacking: No; Transportation Concerns: No Electronic Signature(s) Signed: 05/09/2023  soap and water. Edema Control - Lymphedema / SCD / Other Bilateral Lower Extremities Elevate legs to the level of the heart or above for 30 minutes daily and/or when sitting for 3-4 times a day throughout the day. Avoid standing for long periods of time. Patient to wear own compression stockings every day. Exercise regularly Additional Orders / Instructions Follow Nutritious Diet - Increase protein intake or maintain at 70-100g/day. Electronic Signature(s) Signed: 05/09/2023 12:20:15 PM By: [name] Entered By: [name] on 05/09/2023 08:49:37 [employer] ([id number]) [id number] Page 3 of 6 -------------------------------------------------------------------------------- Problem List Details Patient Name: Date of Service: [name] 05/09/2023 11:15 A M Medical Record Number: [id number] Patient Account Number: [id number] Date of Birth/Sex: Treating RN: 1934/11/23 (88 y.o. [name] Primary Care Provider: Eloisa [name] Other Clinician: Referring Provider: Treating Provider/Extender: Marena Chancy in Treatment: 2 Active Problems ICD-10 Encounter Code Description Active Date MDM Diagnosis [phone] Non-pressure chronic ulcer of other part of right lower leg with fat layer 04/19/2023 No Yes exposed E11.622 Type 2 diabetes mellitus with other skin ulcer 04/19/2023 No Yes Inactive Problems Resolved Problems Electronic Signature(s) Signed: 05/09/2023 11:46:06 AM By:  [name] Entered By: [name] on 05/09/2023 08:46:06 -------------------------------------------------------------------------------- Progress Note Details Patient Name: Date of Service: Tina Meyer C. 05/09/2023 11:15 A M Medical Record Number: [id number] Patient Account Number: [id number] Date of Birth/Sex: Treating RN: 10-09-1934 (88 y.o. F) Primary Care Provider: Eloisa [name] Other Clinician: Referring Provider: Treating Provider/Extender: Marena Chancy in Treatment: 2 Subjective Chief Complaint Information obtained from Patient Patient seen for complaints of Non-Healing Wound. History of Present Illness (HPI) ADMISSION 04/19/2023 ***ABIs R: 1.03; L: 1.01*** This is an 88 year old woman with type 2 diabetes (Most recent hemoglobin A1c 7.0% on April 04, 2023) and history of non-small cell lung cancer with bony metastases. She was recently discharged from the hospital where she was admitted after a fall. As part of the injuries she sustained in the fall, she struck the anterior tibial surface of her right lower leg. While in the hospital wound care was consulted and they recommended application of Mepitel and mupirocin. She was also discharged on a course of Augmentin due to concern for cellulitis associated with the wound. Upon discharge from the hospital, she was referred to the wound care center for further evaluation and management. 04/25/2023: Her wound is about the same size today, perhaps slightly smaller. There is still little bit of slough accumulation at the cranial aspect of the wound where it is deepest. Edema control is good. RONIKA, KELSON ([id number]) [id number] Page 4 of 6 05/02/2023: The wound is markedly smaller today with just the area at the most cranial aspect remaining open. It is shallower than it was last week and has just a small amount of slough. Edema control  remains good. 05/09/2023: Her wound is healed. Patient History Information obtained from Chart. Family History Cancer - Maternal Grandparents, Diabetes - Mother,Siblings, Heart Disease - Father. Social History Former smoker - ended on 07/31/1993, Alcohol Use - Never, Drug Use - No History, Caffeine Use - Never. Medical History Cardiovascular Patient has history of Coronary Artery Disease, Hypertension Endocrine Patient has history of Type II Diabetes - states "prediabetic" Musculoskeletal Patient has history of Osteoarthritis Hospitalization/Surgery History - left heart cath. - laminectomy. - eye surgery. - abdominal hysterectomy. - angioplasty. - tonsillectomy. 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 no acute distress. Vitals Time Taken: 11:17 AM, Height: 65 in, Weight: 165 lbs, BMI: 27.5, Temperature: 97.8 F, Pulse: 65 bpm, Respiratory Rate: 18 breaths/min, Blood Pressure: 153/66 mmHg. Respiratory Normal work of breathing on room air. General Notes: 05/09/2023: Her wound is healed. Integumentary (Hair, Skin) Wound #1 status is Healed - Epithelialized. Original cause of wound was Trauma. The date acquired was: 03/29/2023. The wound has been in treatment 2 weeks. The wound is located on the Right,Lateral Lower Leg. The wound measures 0cm length x 0cm width x 0cm depth; 0cm^2 area and 0cm^3 volume. There is Fat Layer (Subcutaneous Tissue) exposed. There is no tunneling or undermining noted. There is a small amount of serosanguineous drainage noted. The wound margin is distinct with the outline attached to the wound base. There is small (1-33%) pink granulation within the wound bed. There is no necrotic tissue within the wound bed. The periwound skin appearance had no abnormalities noted for texture. The periwound skin appearance had no  abnormalities noted for moisture. The periwound skin appearance had no abnormalities noted for color. Periwound temperature was noted as No Abnormality. Assessment Active Problems ICD-10 Non-pressure chronic ulcer of other part of right lower leg with fat layer exposed Type 2 diabetes mellitus with other skin ulcer Plan Discharge From [facility] Services: Discharge from Wound Care Center - Congratulations! Bathing/ Shower/ Hygiene: May shower and wash wound with soap and water. DAVANEE, KLINKNER ([id number]) [id number] Page 5 of 6 Edema Control - Lymphedema / SCD / Other: Elevate legs to the level of the heart or above for 30 minutes daily and/or when sitting for 3-4 times a day throughout the day. Avoid standing for long periods of time. Patient to wear own compression stockings every day. Exercise regularly Additional Orders / Instructions: Follow Nutritious Diet - Increase protein intake or maintain at 70-100g/day. 05/09/2023: Her wound is healed. I have recommended that she wear compression stockings daily and try to keep her legs elevated is much as possible during the day and at night while she sleeps. We will discharge her from the wound care center. She may follow-up as needed in the future. Electronic Signature(s) Signed: 05/09/2023 11:50:09 AM By: [name] Entered By: [name] on 05/09/2023 08:50:09 -------------------------------------------------------------------------------- HxROS Details Patient Name: Date of Service: [name]. 05/09/2023 11:15 A M Medical Record Number: [id number] Patient Account Number: [id number] Date of Birth/Sex: Treating RN: [birth year] (88 y.o. F) Primary Care Provider: Eloisa [name] Other Clinician: Referring Provider: Treating Provider/Extender: Marena Chancy in Treatment: 2 Information Obtained From Chart Cardiovascular Medical History: Positive for:  Coronary Artery Disease; Hypertension Endocrine Medical History: Positive for: Type II Diabetes - states "prediabetic" Musculoskeletal Medical History: Positive for: Osteoarthritis Past Medical History Notes: Osteoporosis Neurologic Medical History: Past Medical History Notes: Aneurysm Oncologic Medical History: Past Medical History Notes: Lung CA with metastases to bone Immunizations Pneumococcal Vaccine: Received Pneumococcal Vaccination: No Implantable Devices No devices added Hospitalization / Surgery History Type of Hospitalization/Surgery [name], Tina ([id number]) [id number] Page 6 of 6 left heart cath laminectomy eye surgery abdominal hysterectomy angioplasty tonsillectomy Family and Social History Cancer: Yes - Maternal Grandparents; Diabetes: Yes - Mother,Siblings; Heart Disease: Yes - Father; Former smoker - ended on 07/31/1993; Alcohol Use: Never; Drug Use: No History; Caffeine Use: Never; Financial Concerns: No; Food, Clothing or Shelter Needs: No; Support System Lacking: No; Transportation Concerns: No Electronic Signature(s) Signed: 05/09/2023  soap and water. Edema Control - Lymphedema / SCD / Other Bilateral Lower Extremities Elevate legs to the level of the heart or above for 30 minutes daily and/or when sitting for 3-4 times a day throughout the day. Avoid standing for long periods of time. Patient to wear own compression stockings every day. Exercise regularly Additional Orders / Instructions Follow Nutritious Diet - Increase protein intake or maintain at 70-100g/day. Electronic Signature(s) Signed: 05/09/2023 12:20:15 PM By: [name] Entered By: [name] on 05/09/2023 08:49:37 [employer] ([id number]) [id number] Page 3 of 6 -------------------------------------------------------------------------------- Problem List Details Patient Name: Date of Service: [name] 05/09/2023 11:15 A M Medical Record Number: [id number] Patient Account Number: [id number] Date of Birth/Sex: Treating RN: 1934/11/23 (88 y.o. [name] Primary Care Provider: Eloisa [name] Other Clinician: Referring Provider: Treating Provider/Extender: Marena Chancy in Treatment: 2 Active Problems ICD-10 Encounter Code Description Active Date MDM Diagnosis [phone] Non-pressure chronic ulcer of other part of right lower leg with fat layer 04/19/2023 No Yes exposed E11.622 Type 2 diabetes mellitus with other skin ulcer 04/19/2023 No Yes Inactive Problems Resolved Problems Electronic Signature(s) Signed: 05/09/2023 11:46:06 AM By:  [name] Entered By: [name] on 05/09/2023 08:46:06 -------------------------------------------------------------------------------- Progress Note Details Patient Name: Date of Service: Tina Meyer C. 05/09/2023 11:15 A M Medical Record Number: [id number] Patient Account Number: [id number] Date of Birth/Sex: Treating RN: 10-09-1934 (88 y.o. F) Primary Care Provider: Eloisa [name] Other Clinician: Referring Provider: Treating Provider/Extender: Marena Chancy in Treatment: 2 Subjective Chief Complaint Information obtained from Patient Patient seen for complaints of Non-Healing Wound. History of Present Illness (HPI) ADMISSION 04/19/2023 ***ABIs R: 1.03; L: 1.01*** This is an 88 year old woman with type 2 diabetes (Most recent hemoglobin A1c 7.0% on April 04, 2023) and history of non-small cell lung cancer with bony metastases. She was recently discharged from the hospital where she was admitted after a fall. As part of the injuries she sustained in the fall, she struck the anterior tibial surface of her right lower leg. While in the hospital wound care was consulted and they recommended application of Mepitel and mupirocin. She was also discharged on a course of Augmentin due to concern for cellulitis associated with the wound. Upon discharge from the hospital, she was referred to the wound care center for further evaluation and management. 04/25/2023: Her wound is about the same size today, perhaps slightly smaller. There is still little bit of slough accumulation at the cranial aspect of the wound where it is deepest. Edema control is good. RONIKA, KELSON ([id number]) [id number] Page 4 of 6 05/02/2023: The wound is markedly smaller today with just the area at the most cranial aspect remaining open. It is shallower than it was last week and has just a small amount of slough. Edema control  remains good. 05/09/2023: Her wound is healed. Patient History Information obtained from Chart. Family History Cancer - Maternal Grandparents, Diabetes - Mother,Siblings, Heart Disease - Father. Social History Former smoker - ended on 07/31/1993, Alcohol Use - Never, Drug Use - No History, Caffeine Use - Never. Medical History Cardiovascular Patient has history of Coronary Artery Disease, Hypertension Endocrine Patient has history of Type II Diabetes - states "prediabetic" Musculoskeletal Patient has history of Osteoarthritis Hospitalization/Surgery History - left heart cath. - laminectomy. - eye surgery. - abdominal hysterectomy. - angioplasty. - tonsillectomy. Medical A Surgical History Notes nd Musculoskeletal Osteoporosis Neurologic Aneurysm

## 2023-05-10 ENCOUNTER — Other Ambulatory Visit: Payer: Self-pay | Admitting: Nurse Practitioner

## 2023-05-10 ENCOUNTER — Telehealth: Payer: Self-pay

## 2023-05-10 DIAGNOSIS — C3492 Malignant neoplasm of unspecified part of left bronchus or lung: Secondary | ICD-10-CM

## 2023-05-10 MED ORDER — FENTANYL 25 MCG/HR TD PT72
1.0000 | MEDICATED_PATCH | TRANSDERMAL | 0 refills | Status: DC
Start: 2023-05-10 — End: 2023-07-09

## 2023-05-10 NOTE — Telephone Encounter (Signed)
I have sent the physical therapy order to the fax number [phone], addressed to K.D. at [facility]. I spoke with the coordinator, who mentioned that the patient indicated we are her primary care provider. I clarified to the coordinator that we are not her primary care provider; rather, we are her oncology team. The necessary orders should be addressed by her primary care provider for clarification.

## 2023-05-12 ENCOUNTER — Other Ambulatory Visit: Payer: Self-pay | Admitting: Cardiovascular Disease

## 2023-05-13 ENCOUNTER — Other Ambulatory Visit: Payer: Self-pay | Admitting: Oncology

## 2023-05-14 ENCOUNTER — Ambulatory Visit (HOSPITAL_COMMUNITY): Payer: Medicare Other | Attending: Cardiology

## 2023-05-14 DIAGNOSIS — R600 Localized edema: Secondary | ICD-10-CM | POA: Insufficient documentation

## 2023-05-14 LAB — ECHOCARDIOGRAM COMPLETE
Area-P 1/2: 3.08 cm2
S' Lateral: 2.5 cm

## 2023-05-15 ENCOUNTER — Other Ambulatory Visit: Payer: Self-pay | Admitting: Nurse Practitioner

## 2023-05-16 ENCOUNTER — Telehealth: Payer: Self-pay

## 2023-05-16 ENCOUNTER — Ambulatory Visit (HOSPITAL_BASED_OUTPATIENT_CLINIC_OR_DEPARTMENT_OTHER): Payer: Medicare Other | Admitting: General Surgery

## 2023-05-16 NOTE — Telephone Encounter (Signed)
-----   Message from Jonita Albee sent at 05/16/2023  9:33 AM EDT ----- Please tell patient that her echocardiogram showed EF (pumping function of the heart) was normal at 60-65%. Heart walls moving and relaxing normally. Right ventricle functioning normally. The mitral valve is mildly leaky- this does not require treatment at this time, but we will continue to monitor as time goes on. Overall, echo normal. No changes to treatment plan   Thanks KJ

## 2023-05-16 NOTE — Telephone Encounter (Signed)
Called Patient to advise of below and they verbalized understanding.

## 2023-05-18 ENCOUNTER — Inpatient Hospital Stay: Payer: Medicare Other | Attending: Oncology

## 2023-05-18 ENCOUNTER — Other Ambulatory Visit: Payer: Self-pay

## 2023-05-18 ENCOUNTER — Inpatient Hospital Stay: Payer: Medicare Other

## 2023-05-18 ENCOUNTER — Telehealth: Payer: Self-pay

## 2023-05-18 ENCOUNTER — Other Ambulatory Visit (HOSPITAL_BASED_OUTPATIENT_CLINIC_OR_DEPARTMENT_OTHER): Payer: Self-pay

## 2023-05-18 ENCOUNTER — Inpatient Hospital Stay (HOSPITAL_BASED_OUTPATIENT_CLINIC_OR_DEPARTMENT_OTHER): Payer: Medicare Other | Admitting: Nurse Practitioner

## 2023-05-18 ENCOUNTER — Encounter: Payer: Self-pay | Admitting: Nurse Practitioner

## 2023-05-18 VITALS — BP 123/43 | HR 60 | Temp 98.1°F | Resp 18

## 2023-05-18 VITALS — BP 117/53 | HR 51

## 2023-05-18 DIAGNOSIS — C3432 Malignant neoplasm of lower lobe, left bronchus or lung: Secondary | ICD-10-CM | POA: Diagnosis present

## 2023-05-18 DIAGNOSIS — C3492 Malignant neoplasm of unspecified part of left bronchus or lung: Secondary | ICD-10-CM | POA: Diagnosis not present

## 2023-05-18 DIAGNOSIS — Z5112 Encounter for antineoplastic immunotherapy: Secondary | ICD-10-CM | POA: Insufficient documentation

## 2023-05-18 DIAGNOSIS — Z79620 Long term (current) use of immunosuppressive biologic: Secondary | ICD-10-CM | POA: Insufficient documentation

## 2023-05-18 DIAGNOSIS — C9000 Multiple myeloma not having achieved remission: Secondary | ICD-10-CM

## 2023-05-18 DIAGNOSIS — C7951 Secondary malignant neoplasm of bone: Secondary | ICD-10-CM | POA: Diagnosis present

## 2023-05-18 LAB — CMP (CANCER CENTER ONLY)
ALT: 11 U/L (ref 0–44)
AST: 15 U/L (ref 15–41)
Albumin: 3.9 g/dL (ref 3.5–5.0)
Alkaline Phosphatase: 78 U/L (ref 38–126)
Anion gap: 8 (ref 5–15)
BUN: 27 mg/dL — ABNORMAL HIGH (ref 8–23)
CO2: 30 mmol/L (ref 22–32)
Calcium: 9.7 mg/dL (ref 8.9–10.3)
Chloride: 101 mmol/L (ref 98–111)
Creatinine: 1.06 mg/dL — ABNORMAL HIGH (ref 0.44–1.00)
GFR, Estimated: 51 mL/min — ABNORMAL LOW (ref >60)
Glucose, Bld: 156 mg/dL — ABNORMAL HIGH (ref 70–99)
Potassium: 3.9 mmol/L (ref 3.5–5.1)
Sodium: 139 mmol/L (ref 135–145)
Total Bilirubin: 0.3 mg/dL (ref 0.3–1.2)
Total Protein: 7 g/dL (ref 6.5–8.1)

## 2023-05-18 LAB — CBC WITH DIFFERENTIAL (CANCER CENTER ONLY)
Abs Immature Granulocytes: 0.01 10*3/uL (ref 0.00–0.07)
Basophils Absolute: 0 10*3/uL (ref 0.0–0.1)
Basophils Relative: 1 %
Eosinophils Absolute: 0 10*3/uL (ref 0.0–0.5)
Eosinophils Relative: 1 %
HCT: 31.7 % — ABNORMAL LOW (ref 36.0–46.0)
Hemoglobin: 10 g/dL — ABNORMAL LOW (ref 12.0–15.0)
Immature Granulocytes: 0 %
Lymphocytes Relative: 9 %
Lymphs Abs: 0.5 10*3/uL — ABNORMAL LOW (ref 0.7–4.0)
MCH: 29.2 pg (ref 26.0–34.0)
MCHC: 31.5 g/dL (ref 30.0–36.0)
MCV: 92.7 fL (ref 80.0–100.0)
Monocytes Absolute: 0.3 10*3/uL (ref 0.1–1.0)
Monocytes Relative: 6 %
Neutro Abs: 4.9 10*3/uL (ref 1.7–7.7)
Neutrophils Relative %: 83 %
Platelet Count: 176 10*3/uL (ref 150–400)
RBC: 3.42 MIL/uL — ABNORMAL LOW (ref 3.87–5.11)
RDW: 14.9 % (ref 11.5–15.5)
WBC Count: 5.8 10*3/uL (ref 4.0–10.5)
nRBC: 0 % (ref 0.0–0.2)

## 2023-05-18 MED ORDER — SODIUM CHLORIDE 0.9 % IV SOLN
200.0000 mg | Freq: Once | INTRAVENOUS | Status: AC
  Administered 2023-05-18: 200 mg via INTRAVENOUS
  Filled 2023-05-18: qty 8

## 2023-05-18 MED ORDER — HEPARIN SOD (PORK) LOCK FLUSH 100 UNIT/ML IV SOLN
500.0000 [IU] | Freq: Once | INTRAVENOUS | Status: AC | PRN
  Administered 2023-05-18: 500 [IU]

## 2023-05-18 MED ORDER — OXYCODONE HCL 5 MG PO TABS
10.0000 mg | ORAL_TABLET | Freq: Once | ORAL | Status: AC
  Administered 2023-05-18: 10 mg via ORAL
  Filled 2023-05-18: qty 2

## 2023-05-18 MED ORDER — SODIUM CHLORIDE 0.9% FLUSH
10.0000 mL | INTRAVENOUS | Status: DC | PRN
  Administered 2023-05-18: 10 mL

## 2023-05-18 MED ORDER — OXYCODONE HCL 10 MG PO TABS
10.0000 mg | ORAL_TABLET | Freq: Four times a day (QID) | ORAL | 0 refills | Status: DC | PRN
  Filled 2023-05-18: qty 60, 15d supply, fill #0

## 2023-05-18 MED ORDER — SODIUM CHLORIDE 0.9 % IV SOLN: Freq: Once | INTRAVENOUS | Status: AC

## 2023-05-18 NOTE — Progress Notes (Signed)
[facility] OFFICE PROGRESS NOTE   Diagnosis: Non-small cell lung cancer  INTERVAL HISTORY:   [name] returns as scheduled.  She completed another cycle of Pembrolizumab 04/27/2023.  No rash or diarrhea.  Continued left hip/leg pain.  She does note improved pain control since increasing the Duragesic patch to 25 mcg every 3 days.  She has difficulty finding a comfortable position for sleep.  She takes oxycodone 10 mg every 6 hours as needed.  She likes to take a dose when she wakes up in the morning so it is easier for her to get moving.  Bowels are moving.  She is walking with a walker.  Objective:  Vital signs in last 24 hours:  Blood pressure (!) 123/43, pulse 60, temperature 98.1 F (36.7 C), temperature source Temporal, resp. rate 18, SpO2 95%.    HEENT: No thrush or ulcers. Resp: Lungs clear bilaterally. Cardio: Regular rate and rhythm. GI: No hepatosplenomegaly. Vascular: Trace edema lower leg bilaterally. Neuro: Alert and oriented. Skin: No rash. Port-A-Cath without erythema.  Lab Results:  Lab Results  Component Value Date   WBC 5.8 05/18/2023   HGB 10.0 (L) 05/18/2023   HCT 31.7 (L) 05/18/2023   MCV 92.7 05/18/2023   PLT 176 05/18/2023   NEUTROABS 4.9 05/18/2023    Imaging:  No results found.  Medications: I have reviewed the patient's current medications.  Assessment/Plan: Non-small cell lung cancer MRI lumbar spine 04/29/2019- enlarging marrow lesions involving the L1 vertebral body, upper left sacrum and right iliac bone MRI pelvis 04/29/2019- 3.5 cm left iliac bone lesion appears slightly larger; other similar appearing lesions present within the left superior pubic ramus, left superior abdomen acetabulum and upper left sacrum Kappa free light chains with mild elevation 05/12/2019  CTs 05/12/2019- left lower lobe pulmonary mass 3.3 x 3.2 cm; lytic process left iliac bone; spinal lesions; 1.1 cm low-density left kidney lesion; right  thyroid enlargement with heterogeneous appearance with potential for multiple discrete lesions Biopsy left lower lobe lung mass 05/26/2019-poorly differentiated carcinoma; positive for cytokeratin 5/6, p63 and TTF-1, no EGFR, BRAF, ALK, ERBB2,ROS, or NTRK alteration Cycle 1 carboplatin/Alimta/pembrolizumab 06/06/2019 Cycle 2 carboplatin/Alimta/pembrolizumab 06/27/2019 Cycle 3 carboplatin/Alimta/pembrolizumab 07/18/2019 Cycle 4 carboplatin/Alimta/pembrolizumab 08/07/2019 CTs 08/27/2019-significant decrease in size of lobulated mass left lower lobe.  Unchanged appearance of subtle bone lesions. Cycle 5 Alimta/pembrolizumab 08/28/2019 Cycle 6 Alimta/pembrolizumab 09/18/2019 Cycle 7 Alimta/pembrolizumab 10/09/2019 Cycle 8 Alimta/pembrolizumab 10/30/2019 Cycle 9 Alimta/pembrolizumab 11/20/2019 CTs 12/09/2019-no evidence of disease progression, left lower lobe nodule slightly decreased in size, stable L1, left sacral, and left pubic ramus metastases Cycle 10 Alimta/pembrolizumab 12/11/2019 Cycle 11 pembrolizumab alone 01/02/2020 (Alimta held due to edema, tenderness, erythema at the lower legs) Cycle 12 pembrolizumab 01/23/2020 Cycle 13 pembrolizumab 02/12/2020 Cycle 14 pembrolizumab 03/04/2020 CTs 03/18/2020-stable left lower lobe lesion, mild sclerosis at the superior endplate of L1 that was previously hypermetabolic, stable small left upper sacral lucent lesion, previous left superior pubic ramus lesion is occult on the CT, CT head negative for malignancy Cycle 15 pembrolizumab 03/25/2020 Cycle 16 pembrolizumab 04/21/2020 Cycle 17 pembrolizumab 05/17/2020 05/19/2020 bone scan-no definite abnormalities to suggest osseous metastases.  Areas of concern on prior PET-CT involving left iliac bone and L1 vertebral body showed no abnormalities on the current study Cycle 18 Pembrolizumab 06/10/2020 Cycle 19 Pembrolizumab 06/30/2020 CTs 07/16/2020-stable left lower lobe nodule, stable faint superior L1 vertebral lesion, no  evidence of disease progression Cycle 20 pembrolizumab 07/21/2020 Cycle 21 Pembrolizumab 08/11/2020 Cycle 22 pembrolizumab 09/01/2020 Cycle 23 Pembrolizumab 09/22/2020  Cycle 24 pembrolizumab 10/13/2020 Cycle 25 Pembrolizumab 11/03/2020 Cycle 26 pembrolizumab 11/26/2020 CTs 12/15/2020- stable left lower lobe mass, stable sclerotic lesion at L2, no evidence of disease progression Cycle 27 pembrolizumab 12/17/2020 PET scan 01/03/2021-1.8 x 1.3 cm left lower lobe nodule similar in size to CT of 12/15/2020 and measures smaller than previous PET/CT from 2020.  Nodule is markedly hypermetabolic.  No evidence for hypermetabolic hilar or mediastinal lymphadenopathy.  Several tiny foci of hypermetabolism identified in bony anatomy raising concern for skeletal metastases.  Comparison of the PET to CT 07/16/2020-lobular left lower lobe pulmonary nodule measured 2.4 x 1.5 cm on the prior study, current study it measured 1.8 x 1.3 cm.  Lesion in the anterior left acetabulum is similar to the 07/16/2020 exam although overlying cortical thinning slightly more pronounced on the current study.  Described lesion in the scapula shows some cortical sclerosis and a tiny central marrow lucency not substantially changed compared to 07/16/2020.  Left third rib lesion shows heterogeneous mineralization similar to 07/16/2020. MRI of cervical spine 01/13/2021-increased left facet edema at C5-C6, severe facet arthrosis on the left at C7-T1 and on the right at C3-C4, no evidence of metastatic disease Cycle 28 Pembrolizumab 01/14/2021 Cycle 29 Pembrolizumab 02/03/2021 Cycle 30 Pembrolizumab 02/24/2021 MRI left hip 03/02/2021-compared to 04/29/2019, slight increase in size of metastases at the left iliac and left acetabulum.  Lesion at the left upper sacrum is less conspicuous and a lesion at the left superior pubic ramus is stable Cycle 31 pembrolizumab 03/17/2021 Radiation to left acetabulum and left iliac 03/30/2021-04/13/2021 Cycle 32 pembrolizumab  04/07/2021 Cycle 33 Pembrolizumab 04/28/2021 Cycle 34 pembrolizumab 05/18/2021 PET scan 06/01/2021-no significant change in size or degree of FDG uptake associated with FDG avid left lower lobe lung nodule compatible with neoplasm.  Stable to improved appearance of multifocal FDG avid bone metastasis. Cycle 35 pembrolizumab 06/10/2021 Cycle 36 pembrolizumab 07/01/2021 Cycle 37 pembrolizumab 07/29/2021 Cycle 38 pembrolizumab 08/19/2021 Cycle 39 Pembrolizumab 09/09/2021 Cycle 40 pembrolizumab 09/30/2021 CT chest 10/20/2021 to evaluate complaints of chest pain-no PE.  Interval increase in left lower lobe mass. Cycle 41 Pembrolizumab 10/21/2021 Cycle 42 pembrolizumab 11/11/2021 PET 11/24/2021-mild increase in size of left lower lobe mass, no evidence of solid organ or nodal metastases, stable multifocal bone metastases.  No new sites of metastatic disease. Cycle 43 pembrolizumab 11/29/2021 Cycle 45 Pembrolizumab 12/20/2021 Cycle 46 Pembrolizumab 01/09/2022 Cycle 47 pembrolizumab 02/01/2022 Cycle 48 Pembrolizumab 02/24/2022 Cycle 49 Pembrolizumab 03/17/2022 Cycle 50 pembrolizumab 04/07/2022 MRI l pelvis 04/12/2022-"new "lesion at the left superior acetabulum, stable bone lesions at the left suprapubic ramus, left S1, and left iliac Cycle 51 pembrolizumab 04/28/2022 Palliative radiation to the left hip and left scapula 04/26/2022-05/09/2022 Cycle 52 pembrolizumab 05/18/2022 PET 06/02/2022-stable left lower lobe tumor, slight increase in hypermetabolism associated with the left acetabular metastasis, additional previously seen bone lesions no longer have hypermetabolism Cycle 53 pembrolizumab 06/08/2022 Cycle 54 pembrolizumab 06/29/2022 Cycle 55 pembrolizumab 07/20/2022 Cycle 56 pembrolizumab 08/10/2022 Cycle 57 pembrolizumab 08/31/2022 Cycle 58 pembrolizumab 09/21/2022 Cycle 59 pembrolizumab 10/12/2022 Cycle 60 Pembrolizumab 11/02/2022 Cycle 61 pembrolizumab 11/23/2022 Cycle 62 Pembrolizumab 12/21/2022 PET scan  12/29/2022-mild increase in size and activity of the left lower lobe mass Cycle 63 Pembrolizumab 01/11/2023 Cycle 64 Pembrolizumab 01/31/2023 Cycle 65 pembrolizumab 02/21/2023 Cycle 66 Pembrolizumab 03/14/2023 CT pelvis 03/31/2023-mild progressive displacement of the left acetabular fracture, no fracture healing, no new fractures, no new lytic or blastic lesions Cycle 67 Pembrolizumab 04/27/2023 Cycle 68 Pembrolizumab 05/18/2023 Pain secondary to metastatic lung cancer Chronic back pain Type 2  diabetes Essential hypertension CAD Hyperlipidemia Family history significant for multiple members with breast cancer Grade 1 skin rash 07/18/2019 likely related to immunotherapy.  Topical steroid cream as needed. E. coli urinary tract infection 07/14/2019.  Completed cephalexin. Edema/tenderness at the right greater than left ankle 10/21/2019-etiology unclear, potentially related to systemic therapy or an infection, doxycycline prescribed-improved 10/23/2019; marked improvement 11/20/2019; at office visit 01/02/2020 she reports worsening of lower extremity edema, pain/tenderness, erythema 3 to 4 days following each treatment.  Alimta held 01/02/2020.  Referral to dermatology. COVID-19 infection 03/30/2020, monoclonal antibody therapy 04/06/2020 Arthritis, possibly related to Pembrolizumab, improved since beginning Medrol, now followed by Dr. Dierdre Forth Pain left femoral head/upper femur/groin-negative plain x-ray; CT pelvis 03/28/2022-no acute appearing bone abnormality at the left hip or involving the proximal left femur, no evidence of acute fracture or dislocation, soft tissues left hip and left groin unremarkable; osseous mets within the left sacrum, left iliac bone and anterior acetabulum without evidence of associated fracture or dislocation. Percutaneous fixation of left pathologic acetabular fracture 08/23/2022 CT pelvis and left hip 11/02/2022-subacute nondisplaced fracture through the supra-acetabular left ilium  extending through the acetabular roof, placement of 2 screws within the left ilium extending to the supra-acetabular region with an additional screw traversing the left superior pubic ramus, subtle sclerosis in the left ilium and superior left acetabulum, no new site of metastatic disease. CT pelvis 03/31/2023-mild progressive displacement of the left acetabular fracture, no fracture healing   15.  Admission 03/31/2023 after a fall, difficulty with ambulation secondary to generalized weakness and left hip pain 16. acute renal failure and mild rhabdomyolysis on hospital admission 03/31/2023  Disposition: Ms. Cockrill appears stable.  She continues Pembrolizumab for metastatic lung cancer.  There is no clinical evidence of disease progression.  Plan to continue the same.  CBC and chemistry panel reviewed.  Labs adequate to proceed as above.  For pain she will continue Duragesic patch 25 mcg every 3 days with oxycodone every 6 hours as needed.  A new oxycodone prescription was sent to the pharmacy.  She will return for follow-up and Pembrolizumab in 3 weeks.  We are available to see her sooner if needed.    Lonna Cobb ANP/GNP-BC   05/18/2023  2:37 PM

## 2023-05-18 NOTE — Patient Instructions (Signed)

## 2023-05-18 NOTE — Telephone Encounter (Signed)
I reached out to [facility] and spoke with Tammi, LPN, to inform her that the patient received an oxycodone 10 mg dose at 2:52 PM.

## 2023-05-18 NOTE — Progress Notes (Signed)
Patient seen by Lonna Cobb NP today  Vitals are within treatment parameters:Yes   Labs are within treatment parameters: Yes   Treatment plan has been signed: Yes   Per physician team, Patient is ready for treatment and there are NO modifications to the treatment plan. She is goes to receive pain medication.

## 2023-05-18 NOTE — Patient Instructions (Signed)
[facility]   Discharge Instructions: Thank you for choosing [facility] to provide your oncology and hematology care.   If you have a lab appointment with the [facility], please go directly to the [facility] and check in at the registration area.   Wear comfortable clothing and clothing appropriate for easy access to any Portacath or PICC line.   We strive to give you quality time with your provider. You may need to reschedule your appointment if you arrive late (15 or more minutes).  Arriving late affects you and other patients whose appointments are after yours.  Also, if you miss three or more appointments without notifying the office, you may be dismissed from the clinic at the provider's discretion.      For prescription refill requests, have your pharmacy contact our office and allow 72 hours for refills to be completed.    Today you received the following chemotherapy and/or immunotherapy agents Keytruda      To help prevent nausea and vomiting after your treatment, we encourage you to take your nausea medication as directed.  BELOW ARE SYMPTOMS THAT SHOULD BE REPORTED IMMEDIATELY: *FEVER GREATER THAN 100.4 F (38 C) OR HIGHER *CHILLS OR SWEATING *NAUSEA AND VOMITING THAT IS NOT CONTROLLED WITH YOUR NAUSEA MEDICATION *UNUSUAL SHORTNESS OF BREATH *UNUSUAL BRUISING OR BLEEDING *URINARY PROBLEMS (pain or burning when urinating, or frequent urination) *BOWEL PROBLEMS (unusual diarrhea, constipation, pain near the anus) TENDERNESS IN MOUTH AND THROAT WITH OR WITHOUT PRESENCE OF ULCERS (sore throat, sores in mouth, or a toothache) UNUSUAL RASH, SWELLING OR PAIN  UNUSUAL VAGINAL DISCHARGE OR ITCHING   Items with * indicate a potential emergency and should be followed up as soon as possible or go to the Emergency Department if any problems should occur.  Please show the CHEMOTHERAPY ALERT CARD or IMMUNOTHERAPY ALERT CARD at  check-in to the Emergency Department and triage nurse.  Should you have questions after your visit or need to cancel or reschedule your appointment, please contact [facility]  Dept: [phone]  and follow the prompts.  Office hours are 8:00 a.m. to 4:30 p.m. Monday - Friday. Please note that voicemails left after 4:00 p.m. may not be returned until the following business day.  We are closed weekends and major holidays. You have access to a nurse at all times for urgent questions. Please call the main number to the clinic Dept: [phone] and follow the prompts.   For any non-urgent questions, you may also contact your provider using MyChart. We now offer e-Visits for anyone 80 and older to request care online for non-urgent symptoms. For details visit [email or web address].   Also download the MyChart app! Go to the app store, search "MyChart", open the app, select [facility], and log in with your MyChart username and password.

## 2023-05-21 ENCOUNTER — Inpatient Hospital Stay: Payer: Medicare Other

## 2023-05-21 NOTE — Progress Notes (Unsigned)
[facility] CSW Counseling Note  Patient was referred by medical provider. Treatment type: Individual  Presenting Concerns: Patient and/or family reports the following symptoms/concerns: depression Duration of problem: five months; Severity of problem: moderate   Orientation:oriented to person, place, time/date, situation, day of week, month of year, and year.   Affect: NA - due to telephone visit. Risk of harm to self or others: No plan to harm self or others  Patient and/or Family's Strengths/Protective Factors: Social connections, Social and Emotional competence, Concrete supports in place (healthy food, safe environments, etc.), and Sense of purposeAbility for insight  Active sense of humor  Average or above average intelligence  Arboriculturist fund of knowledge  Supportive family/friends      Goals Addressed: Patient will:  Reduce symptoms of: depression Increase knowledge and/or ability of: self-management skills  Increase healthy adjustment to current life circumstances   Progress towards Goals: Initial   Interventions: Interventions utilized:  Motivational Interviewing, Strength-based, and Supportive      Assessment: Patient currently experiencing depression.  Her husband of 66 years, died five months ago.  She moved into [facility] three weeks ago.  "I've never lived alone."  She also has a fractured hip and requires assistive devices to walk.  Patient feels she has lost her freedom.  Patient belongs to [employer] and has several friends in the group.  She attempts to attend and participate in social activities at the assisted living.  Her appetite is good.  Her sleeping is interrupted due to the pain from her hip.  She identifies her feels as grief related.  She has not received counseling in the past.  Her daughter lives in [place] and visits patient almost every day.      Plan: Follow up with CSW: Next Monday, 10/28 at  2pm. Behavioral recommendations: Continue attending support group. Referral(s): None at present.       Pascal Lux Bransyn Adami, LCSW

## 2023-05-22 ENCOUNTER — Encounter: Payer: Self-pay | Admitting: Nurse Practitioner

## 2023-05-22 ENCOUNTER — Encounter: Payer: Self-pay | Admitting: Oncology

## 2023-05-25 ENCOUNTER — Encounter: Payer: Self-pay | Admitting: Oncology

## 2023-05-28 ENCOUNTER — Inpatient Hospital Stay: Payer: Medicare Other | Admitting: Licensed Clinical Social Worker

## 2023-05-28 NOTE — Progress Notes (Signed)
[facility] CSW Counseling Note  Patient was referred by medical provider. Treatment type: Individual  Presenting Concerns: Patient and/or family reports the following symptoms/concerns: depression Duration of problem: five months; Severity of problem: moderate   Orientation:oriented to person, place, time/date, situation, day of week, month of year, and year.   Affect: NA due to phone session. Risk of harm to self or others: No plan to harm self or others  Patient and/or Family's Strengths/Protective Factors: Social connections, Social and Emotional competence, and Concrete supports in place (healthy food, safe environments, etc.)Ability for insight  Active sense of humor  Average or above average intelligence  Arboriculturist fund of knowledge  Religious Affiliation  Supportive family/friends      Goals Addressed: Patient will:  Reduce symptoms of: depression Increase knowledge and/or ability of: self-management skills  Increase healthy adjustment to current life circumstances   Progress towards Goals: Progressing   Interventions: Interventions utilized:  Motivational Interviewing, Strength-based, and Supportive      Assessment: Patient currently experiencing depression related to grief.  Patient appeared to respond positively to life review.  She understands the process of grief and continues utilizing her support systems.      Plan: Follow up with CSW: Next Monday, 11/4, at 2pm via telephone. Behavioral recommendations: Continue attending support group and doing physical therapy. Referral(s): None at present.       Tina Lux Arlo Butt, LCSW

## 2023-06-03 ENCOUNTER — Other Ambulatory Visit: Payer: Self-pay | Admitting: Oncology

## 2023-06-04 ENCOUNTER — Inpatient Hospital Stay: Payer: Medicare Other | Attending: Oncology

## 2023-06-04 DIAGNOSIS — R82998 Other abnormal findings in urine: Secondary | ICD-10-CM | POA: Insufficient documentation

## 2023-06-04 DIAGNOSIS — Z79620 Long term (current) use of immunosuppressive biologic: Secondary | ICD-10-CM | POA: Insufficient documentation

## 2023-06-04 DIAGNOSIS — C7951 Secondary malignant neoplasm of bone: Secondary | ICD-10-CM | POA: Insufficient documentation

## 2023-06-04 DIAGNOSIS — C3432 Malignant neoplasm of lower lobe, left bronchus or lung: Secondary | ICD-10-CM | POA: Insufficient documentation

## 2023-06-04 DIAGNOSIS — Z5112 Encounter for antineoplastic immunotherapy: Secondary | ICD-10-CM | POA: Insufficient documentation

## 2023-06-04 NOTE — Progress Notes (Signed)
[facility] CSW Counseling Note  Patient was referred by medical provider. Treatment type: Individual  Presenting Concerns: Patient and/or family reports the following symptoms/concerns: depression Duration of problem: 5 months; Severity of problem: moderate   Orientation:oriented to person, place, time/date, situation, day of week, month of year, and year.   Affect: NA due to telephone session. Risk of harm to self or others: No plan to harm self or others  Patient and/or Family's Strengths/Protective Factors: Concrete supports in place (healthy food, safe environments, etc.)Ability for insight  Active sense of humor  Average or above average intelligence  Communication skills  Financial means  General fund of knowledge  Religious Affiliation  Supportive family/friends      Goals Addressed: Patient will:  Reduce symptoms of: anxiety Increase knowledge and/or ability of: self-management skills  Increase healthy adjustment to current life circumstances   Progress towards Goals: Met   Interventions: Interventions utilized:  Motivational Interviewing, Strength-based, and Supportive      Assessment: Patient currently experiencing a decrease in her depression.  She stated she felt comfortable with her daughter being out of town for the weekend.  She reports continued feelings of grief, but not as severe this past week.  She is recovering from her physical therapy, which takes effort.  She is open to CSW contacting her in the future to touch base.      Plan: Follow up with CSW: as needed by patient. Behavioral recommendations: Continue attending support group and doing physicial therapy. Referral(s): None at this time.       Tina Lux Noemie Devivo, LCSW

## 2023-06-06 ENCOUNTER — Telehealth: Payer: Self-pay | Admitting: *Deleted

## 2023-06-06 ENCOUNTER — Other Ambulatory Visit: Payer: Self-pay | Admitting: Nurse Practitioner

## 2023-06-06 DIAGNOSIS — C3492 Malignant neoplasm of unspecified part of left bronchus or lung: Secondary | ICD-10-CM

## 2023-06-06 MED ORDER — OXYCODONE HCL 10 MG PO TABS: 10.0000 mg | ORAL_TABLET | Freq: Four times a day (QID) | ORAL | 0 refills | Status: DC | PRN

## 2023-06-06 NOTE — Telephone Encounter (Signed)
Call from [place] at [facility]. Needs refill on oxycodone 10 mg. Send to [facility].

## 2023-06-08 ENCOUNTER — Inpatient Hospital Stay: Payer: Medicare Other

## 2023-06-08 ENCOUNTER — Encounter: Payer: Self-pay | Admitting: Oncology

## 2023-06-08 ENCOUNTER — Encounter: Payer: Self-pay | Admitting: *Deleted

## 2023-06-08 ENCOUNTER — Inpatient Hospital Stay (HOSPITAL_BASED_OUTPATIENT_CLINIC_OR_DEPARTMENT_OTHER): Payer: Medicare Other | Admitting: Oncology

## 2023-06-08 VITALS — BP 132/60 | HR 58 | Temp 98.1°F | Resp 18 | Ht 65.0 in | Wt 168.0 lb

## 2023-06-08 DIAGNOSIS — C3432 Malignant neoplasm of lower lobe, left bronchus or lung: Secondary | ICD-10-CM | POA: Diagnosis present

## 2023-06-08 DIAGNOSIS — Z95828 Presence of other vascular implants and grafts: Secondary | ICD-10-CM

## 2023-06-08 DIAGNOSIS — Z79620 Long term (current) use of immunosuppressive biologic: Secondary | ICD-10-CM | POA: Diagnosis not present

## 2023-06-08 DIAGNOSIS — Z5112 Encounter for antineoplastic immunotherapy: Secondary | ICD-10-CM | POA: Diagnosis present

## 2023-06-08 DIAGNOSIS — R82998 Other abnormal findings in urine: Secondary | ICD-10-CM | POA: Diagnosis not present

## 2023-06-08 DIAGNOSIS — C3492 Malignant neoplasm of unspecified part of left bronchus or lung: Secondary | ICD-10-CM | POA: Diagnosis not present

## 2023-06-08 DIAGNOSIS — C7951 Secondary malignant neoplasm of bone: Secondary | ICD-10-CM | POA: Diagnosis present

## 2023-06-08 LAB — CMP (CANCER CENTER ONLY)
ALT: 14 U/L (ref 0–44)
AST: 17 U/L (ref 15–41)
Albumin: 4 g/dL (ref 3.5–5.0)
Alkaline Phosphatase: 62 U/L (ref 38–126)
Anion gap: 9 (ref 5–15)
BUN: 30 mg/dL — ABNORMAL HIGH (ref 8–23)
CO2: 30 mmol/L (ref 22–32)
Calcium: 9.9 mg/dL (ref 8.9–10.3)
Chloride: 100 mmol/L (ref 98–111)
Creatinine: 1.33 mg/dL — ABNORMAL HIGH (ref 0.44–1.00)
GFR, Estimated: 39 mL/min — ABNORMAL LOW (ref >60)
Glucose, Bld: 136 mg/dL — ABNORMAL HIGH (ref 70–99)
Potassium: 3.7 mmol/L (ref 3.5–5.1)
Sodium: 139 mmol/L (ref 135–145)
Total Bilirubin: 0.3 mg/dL (ref <1.2)
Total Protein: 6.7 g/dL (ref 6.5–8.1)

## 2023-06-08 LAB — CBC WITH DIFFERENTIAL (CANCER CENTER ONLY)
Abs Immature Granulocytes: 0.02 10*3/uL (ref 0.00–0.07)
Basophils Absolute: 0 10*3/uL (ref 0.0–0.1)
Basophils Relative: 0 %
Eosinophils Absolute: 0 10*3/uL (ref 0.0–0.5)
Eosinophils Relative: 1 %
HCT: 31.3 % — ABNORMAL LOW (ref 36.0–46.0)
Hemoglobin: 9.9 g/dL — ABNORMAL LOW (ref 12.0–15.0)
Immature Granulocytes: 0 %
Lymphocytes Relative: 6 %
Lymphs Abs: 0.4 10*3/uL — ABNORMAL LOW (ref 0.7–4.0)
MCH: 29.3 pg (ref 26.0–34.0)
MCHC: 31.6 g/dL (ref 30.0–36.0)
MCV: 92.6 fL (ref 80.0–100.0)
Monocytes Absolute: 0.3 10*3/uL (ref 0.1–1.0)
Monocytes Relative: 6 %
Neutro Abs: 5.1 10*3/uL (ref 1.7–7.7)
Neutrophils Relative %: 87 %
Platelet Count: 171 10*3/uL (ref 150–400)
RBC: 3.38 MIL/uL — ABNORMAL LOW (ref 3.87–5.11)
RDW: 14.6 % (ref 11.5–15.5)
WBC Count: 5.9 10*3/uL (ref 4.0–10.5)
nRBC: 0 % (ref 0.0–0.2)

## 2023-06-08 LAB — TSH: TSH: 1.02 u[IU]/mL (ref 0.350–4.500)

## 2023-06-08 MED ORDER — SODIUM CHLORIDE 0.9 % IV SOLN
3.5000 mg | Freq: Once | INTRAVENOUS | Status: AC
  Administered 2023-06-08: 3.5 mg via INTRAVENOUS
  Filled 2023-06-08: qty 4.38

## 2023-06-08 MED ORDER — HEPARIN SOD (PORK) LOCK FLUSH 100 UNIT/ML IV SOLN
500.0000 [IU] | Freq: Once | INTRAVENOUS | Status: AC | PRN
  Administered 2023-06-08: 500 [IU]

## 2023-06-08 MED ORDER — SODIUM CHLORIDE 0.9% FLUSH
10.0000 mL | INTRAVENOUS | Status: DC | PRN
  Administered 2023-06-08: 10 mL

## 2023-06-08 MED ORDER — SODIUM CHLORIDE 0.9 % IV SOLN: Freq: Once | INTRAVENOUS | Status: AC

## 2023-06-08 MED ORDER — SODIUM CHLORIDE 0.9 % IV SOLN
200.0000 mg | Freq: Once | INTRAVENOUS | Status: AC
  Administered 2023-06-08: 200 mg via INTRAVENOUS
  Filled 2023-06-08: qty 8

## 2023-06-08 NOTE — Progress Notes (Signed)
[facility] CSW Counseling Note  Patient was referred by nurse. Treatment type: Individual  Presenting Concerns: Patient and/or family reports the following symptoms/concerns: depression Duration of problem: Five months; Severity of problem: moderate   Orientation:oriented to person, place, time/date, situation, day of week, month of year, and year.   Affect: NA Risk of harm to self or others: No plan to harm self or others  Patient and/or Family's Strengths/Protective Factors: Concrete supports in place (healthy food, safe environments, etc.)Ability for insight  Active sense of humor  Average or above average intelligence  Communication skills  Financial means  General fund of knowledge  Religious Affiliation  Supportive family/friends      Goals Addressed: Patient will:  Reduce symptoms of: depression Increase knowledge and/or ability of: self-management skills  Increase healthy adjustment to current life circumstances   Progress towards Goals: Progressing   Interventions: Interventions utilized:  Motivational Interviewing, Strength-based, and Supportive      Assessment: Patient currently experiencing grief.  She had gone to the cancer center today and that triggered a grief response.  She said it reminded her of when she used to take her husband there.  She said she rested when she got home, which helped decrease her symptoms.  She also had company at their assisted living.  CSW normalized her feelings and provided education regarding the grief process.      Plan: Follow up with CSW: as needed by patient. Behavioral recommendations: Continue attending support group and resting as needed. Referral(s): None at this time.       Pascal Lux Amauris Debois, LCSW

## 2023-06-08 NOTE — Progress Notes (Signed)
[facility] OFFICE PROGRESS NOTE   Diagnosis: Non-small cell lung cancer  INTERVAL HISTORY:   [name] returns as scheduled.  She was last treated with pembrolizumab on 05/18/2023.  She now resides in assisted living at [facility].  This is working out well.  She is participating in physical therapy.  She continues to have pain at the left hip/groin.  The pain is relieved with a Duragesic patch and oxycodone.  She ambulates with a walker.  Objective:  Vital signs in last 24 hours:  Blood pressure 132/60, pulse (!) 58, temperature 98.1 F (36.7 C), temperature source Temporal, resp. rate 18, height 5\' 5"  (1.651 m), weight 168 lb (76.2 kg), SpO2 96%.  Resp: Lungs clear bilaterally Cardio: Regular rate and rhythm GI: No hepatosplenomegaly Vascular: Trace edema at the left greater than right lower leg with chronic stasis change at the left lower leg    Portacath/PICC-without erythema  Lab Results:  Lab Results  Component Value Date   WBC 5.9 06/08/2023   HGB 9.9 (L) 06/08/2023   HCT 31.3 (L) 06/08/2023   MCV 92.6 06/08/2023   PLT 171 06/08/2023   NEUTROABS 5.1 06/08/2023    CMP  Lab Results  Component Value Date   NA 139 05/18/2023   K 3.9 05/18/2023   CL 101 05/18/2023   CO2 30 05/18/2023   GLUCOSE 156 (H) 05/18/2023   BUN 27 (H) 05/18/2023   CREATININE 1.06 (H) 05/18/2023   CALCIUM 9.7 05/18/2023   PROT 7.0 05/18/2023   ALBUMIN 3.9 05/18/2023   AST 15 05/18/2023   ALT 11 05/18/2023   ALKPHOS 78 05/18/2023   BILITOT 0.3 05/18/2023   GFRNONAA 51 (L) 05/18/2023   GFRAA >60 04/21/2020     Medications: I have reviewed the patient's current medications.   Assessment/Plan: Non-small cell lung cancer MRI lumbar spine 04/29/2019- enlarging marrow lesions involving the L1 vertebral body, upper left sacrum and right iliac bone MRI pelvis 04/29/2019- 3.5 cm left iliac bone lesion appears slightly larger; other similar appearing lesions present  within the left superior pubic ramus, left superior abdomen acetabulum and upper left sacrum Kappa free light chains with mild elevation 05/12/2019  CTs 05/12/2019- left lower lobe pulmonary mass 3.3 x 3.2 cm; lytic process left iliac bone; spinal lesions; 1.1 cm low-density left kidney lesion; right thyroid enlargement with heterogeneous appearance with potential for multiple discrete lesions Biopsy left lower lobe lung mass 05/26/2019-poorly differentiated carcinoma; positive for cytokeratin 5/6, p63 and TTF-1, no EGFR, BRAF, ALK, ERBB2,ROS, or NTRK alteration Cycle 1 carboplatin/Alimta/pembrolizumab 06/06/2019 Cycle 2 carboplatin/Alimta/pembrolizumab 06/27/2019 Cycle 3 carboplatin/Alimta/pembrolizumab 07/18/2019 Cycle 4 carboplatin/Alimta/pembrolizumab 08/07/2019 CTs 08/27/2019-significant decrease in size of lobulated mass left lower lobe.  Unchanged appearance of subtle bone lesions. Cycle 5 Alimta/pembrolizumab 08/28/2019 Cycle 6 Alimta/pembrolizumab 09/18/2019 Cycle 7 Alimta/pembrolizumab 10/09/2019 Cycle 8 Alimta/pembrolizumab 10/30/2019 Cycle 9 Alimta/pembrolizumab 11/20/2019 CTs 12/09/2019-no evidence of disease progression, left lower lobe nodule slightly decreased in size, stable L1, left sacral, and left pubic ramus metastases Cycle 10 Alimta/pembrolizumab 12/11/2019 Cycle 11 pembrolizumab alone 01/02/2020 (Alimta held due to edema, tenderness, erythema at the lower legs) Cycle 12 pembrolizumab 01/23/2020 Cycle 13 pembrolizumab 02/12/2020 Cycle 14 pembrolizumab 03/04/2020 CTs 03/18/2020-stable left lower lobe lesion, mild sclerosis at the superior endplate of L1 that was previously hypermetabolic, stable small left upper sacral lucent lesion, previous left superior pubic ramus lesion is occult on the CT, CT head negative for malignancy Cycle 15 pembrolizumab 03/25/2020 Cycle 16 pembrolizumab 04/21/2020 Cycle 17 pembrolizumab 05/17/2020 05/19/2020 bone  scan-no definite abnormalities to suggest osseous  metastases.  Areas of concern on prior PET-CT involving left iliac bone and L1 vertebral body showed no abnormalities on the current study Cycle 18 Pembrolizumab 06/10/2020 Cycle 19 Pembrolizumab 06/30/2020 CTs 07/16/2020-stable left lower lobe nodule, stable faint superior L1 vertebral lesion, no evidence of disease progression Cycle 20 pembrolizumab 07/21/2020 Cycle 21 Pembrolizumab 08/11/2020 Cycle 22 pembrolizumab 09/01/2020 Cycle 23 Pembrolizumab 09/22/2020 Cycle 24 pembrolizumab 10/13/2020 Cycle 25 Pembrolizumab 11/03/2020 Cycle 26 pembrolizumab 11/26/2020 CTs 12/15/2020- stable left lower lobe mass, stable sclerotic lesion at L2, no evidence of disease progression Cycle 27 pembrolizumab 12/17/2020 PET scan 01/03/2021-1.8 x 1.3 cm left lower lobe nodule similar in size to CT of 12/15/2020 and measures smaller than previous PET/CT from 2020.  Nodule is markedly hypermetabolic.  No evidence for hypermetabolic hilar or mediastinal lymphadenopathy.  Several tiny foci of hypermetabolism identified in bony anatomy raising concern for skeletal metastases.  Comparison of the PET to CT 07/16/2020-lobular left lower lobe pulmonary nodule measured 2.4 x 1.5 cm on the prior study, current study it measured 1.8 x 1.3 cm.  Lesion in the anterior left acetabulum is similar to the 07/16/2020 exam although overlying cortical thinning slightly more pronounced on the current study.  Described lesion in the scapula shows some cortical sclerosis and a tiny central marrow lucency not substantially changed compared to 07/16/2020.  Left third rib lesion shows heterogeneous mineralization similar to 07/16/2020. MRI of cervical spine 01/13/2021-increased left facet edema at C5-C6, severe facet arthrosis on the left at C7-T1 and on the right at C3-C4, no evidence of metastatic disease Cycle 28 Pembrolizumab 01/14/2021 Cycle 29 Pembrolizumab 02/03/2021 Cycle 30 Pembrolizumab 02/24/2021 MRI left hip 03/02/2021-compared to 04/29/2019, slight  increase in size of metastases at the left iliac and left acetabulum.  Lesion at the left upper sacrum is less conspicuous and a lesion at the left superior pubic ramus is stable Cycle 31 pembrolizumab 03/17/2021 Radiation to left acetabulum and left iliac 03/30/2021-04/13/2021 Cycle 32 pembrolizumab 04/07/2021 Cycle 33 Pembrolizumab 04/28/2021 Cycle 34 pembrolizumab 05/18/2021 PET scan 06/01/2021-no significant change in size or degree of FDG uptake associated with FDG avid left lower lobe lung nodule compatible with neoplasm.  Stable to improved appearance of multifocal FDG avid bone metastasis. Cycle 35 pembrolizumab 06/10/2021 Cycle 36 pembrolizumab 07/01/2021 Cycle 37 pembrolizumab 07/29/2021 Cycle 38 pembrolizumab 08/19/2021 Cycle 39 Pembrolizumab 09/09/2021 Cycle 40 pembrolizumab 09/30/2021 CT chest 10/20/2021 to evaluate complaints of chest pain-no PE.  Interval increase in left lower lobe mass. Cycle 41 Pembrolizumab 10/21/2021 Cycle 42 pembrolizumab 11/11/2021 PET 11/24/2021-mild increase in size of left lower lobe mass, no evidence of solid organ or nodal metastases, stable multifocal bone metastases.  No new sites of metastatic disease. Cycle 43 pembrolizumab 11/29/2021 Cycle 45 Pembrolizumab 12/20/2021 Cycle 46 Pembrolizumab 01/09/2022 Cycle 47 pembrolizumab 02/01/2022 Cycle 48 Pembrolizumab 02/24/2022 Cycle 49 Pembrolizumab 03/17/2022 Cycle 50 pembrolizumab 04/07/2022 MRI l pelvis 04/12/2022-"new "lesion at the left superior acetabulum, stable bone lesions at the left suprapubic ramus, left S1, and left iliac Cycle 51 pembrolizumab 04/28/2022 Palliative radiation to the left hip and left scapula 04/26/2022-05/09/2022 Cycle 52 pembrolizumab 05/18/2022 PET 06/02/2022-stable left lower lobe tumor, slight increase in hypermetabolism associated with the left acetabular metastasis, additional previously seen bone lesions no longer have hypermetabolism Cycle 53 pembrolizumab 06/08/2022 Cycle 54 pembrolizumab  06/29/2022 Cycle 55 pembrolizumab 07/20/2022 Cycle 56 pembrolizumab 08/10/2022 Cycle 57 pembrolizumab 08/31/2022 Cycle 58 pembrolizumab 09/21/2022 Cycle 59 pembrolizumab 10/12/2022 Cycle 60 Pembrolizumab 11/02/2022 Cycle 61 pembrolizumab 11/23/2022 Cycle 62 Pembrolizumab 12/21/2022  PET scan 12/29/2022-mild increase in size and activity of the left lower lobe mass Cycle 63 Pembrolizumab 01/11/2023 Cycle 64 Pembrolizumab 01/31/2023 Cycle 65 pembrolizumab 02/21/2023 Cycle 66 Pembrolizumab 03/14/2023 CT pelvis 03/31/2023-mild progressive displacement of the left acetabular fracture, no fracture healing, no new fractures, no new lytic or blastic lesions Cycle 67 Pembrolizumab 04/27/2023 Cycle 68 Pembrolizumab 05/18/2023 Cycle 69 pembrolizumab 06/08/2023 Pain secondary to metastatic lung cancer Chronic back pain Type 2 diabetes Essential hypertension CAD Hyperlipidemia Family history significant for multiple members with breast cancer Grade 1 skin rash 07/18/2019 likely related to immunotherapy.  Topical steroid cream as needed. E. coli urinary tract infection 07/14/2019.  Completed cephalexin. Edema/tenderness at the right greater than left ankle 10/21/2019-etiology unclear, potentially related to systemic therapy or an infection, doxycycline prescribed-improved 10/23/2019; marked improvement 11/20/2019; at office visit 01/02/2020 she reports worsening of lower extremity edema, pain/tenderness, erythema 3 to 4 days following each treatment.  Alimta held 01/02/2020.  Referral to dermatology. COVID-19 infection 03/30/2020, monoclonal antibody therapy 04/06/2020 Arthritis, possibly related to Pembrolizumab, improved since beginning Medrol, now followed by Dr. Dierdre Forth Pain left femoral head/upper femur/groin-negative plain x-ray; CT pelvis 03/28/2022-no acute appearing bone abnormality at the left hip or involving the proximal left femur, no evidence of acute fracture or dislocation, soft tissues left hip and left groin  unremarkable; osseous mets within the left sacrum, left iliac bone and anterior acetabulum without evidence of associated fracture or dislocation. Percutaneous fixation of left pathologic acetabular fracture 08/23/2022 CT pelvis and left hip 11/02/2022-subacute nondisplaced fracture through the supra-acetabular left ilium extending through the acetabular roof, placement of 2 screws within the left ilium extending to the supra-acetabular region with an additional screw traversing the left superior pubic ramus, subtle sclerosis in the left ilium and superior left acetabulum, no new site of metastatic disease. CT pelvis 03/31/2023-mild progressive displacement of the left acetabular fracture, no fracture healing   15.  Admission 03/31/2023 after a fall, difficulty with ambulation secondary to generalized weakness and left hip pain 16. acute renal failure and mild rhabdomyolysis on hospital admission 03/31/2023 17.  Anemia secondary to chronic disease and bone marrow involvement with tumor    Disposition: [name] appears stable.  She continues to have pain secondary to the acetabular fracture.  I reviewed the case with orthopedics.  There is no surgical option.  There is no clinical evidence of disease progression.  The plan is to continue pembrolizumab.  [name] will return for an office visit and pembrolizumab in 3 weeks.  Thornton Papas, MD  06/08/2023  10:29 AM

## 2023-06-08 NOTE — Progress Notes (Signed)
Patient seen by Dr. Thornton Papas today  Vitals are within treatment parameters: OK to proceed w/pulse 58  Labs are within treatment parameters: Yes   Treatment plan has been signed: Yes   Per physician team, Patient is ready for treatment and there are NO modifications to the treatment plan. Will also receive Zometa today

## 2023-06-08 NOTE — Patient Instructions (Signed)

## 2023-06-08 NOTE — Patient Instructions (Signed)
[facility] - A DEPT OF [facility]  Discharge Instructions: Thank you for choosing [facility] to provide your oncology and hematology care.   If you have a lab appointment with the Cancer Center, please go directly to the Cancer Center and check in at the registration area.   Wear comfortable clothing and clothing appropriate for easy access to any Portacath or PICC line.   We strive to give you quality time with your provider. You may need to reschedule your appointment if you arrive late (15 or more minutes).  Arriving late affects you and other patients whose appointments are after yours.  Also, if you miss three or more appointments without notifying the office, you may be dismissed from the clinic at the provider's discretion.      For prescription refill requests, have your pharmacy contact our office and allow 72 hours for refills to be completed.    Today you received the following chemotherapy and/or immunotherapy agents Keytruda      To help prevent nausea and vomiting after your treatment, we encourage you to take your nausea medication as directed.  BELOW ARE SYMPTOMS THAT SHOULD BE REPORTED IMMEDIATELY: *FEVER GREATER THAN 100.4 F (38 C) OR HIGHER *CHILLS OR SWEATING *NAUSEA AND VOMITING THAT IS NOT CONTROLLED WITH YOUR NAUSEA MEDICATION *UNUSUAL SHORTNESS OF BREATH *UNUSUAL BRUISING OR BLEEDING *URINARY PROBLEMS (pain or burning when urinating, or frequent urination) *BOWEL PROBLEMS (unusual diarrhea, constipation, pain near the anus) TENDERNESS IN MOUTH AND THROAT WITH OR WITHOUT PRESENCE OF ULCERS (sore throat, sores in mouth, or a toothache) UNUSUAL RASH, SWELLING OR PAIN  UNUSUAL VAGINAL DISCHARGE OR ITCHING   Items with * indicate a potential emergency and should be followed up as soon as possible or go to the Emergency Department if any problems should occur.  Please show the CHEMOTHERAPY ALERT CARD or IMMUNOTHERAPY  ALERT CARD at check-in to the Emergency Department and triage nurse.  Should you have questions after your visit or need to cancel or reschedule your appointment, please contact [facility] - [facility]  Dept: [phone]  and follow the prompts.  Office hours are 8:00 a.m. to 4:30 p.m. Monday - Friday. Please note that voicemails left after 4:00 p.m. may not be returned until the following business day.  We are closed weekends and major holidays. You have access to a nurse at all times for urgent questions. Please call the main number to the clinic Dept: [phone] and follow the prompts.   For any non-urgent questions, you may also contact your provider using MyChart. We now offer e-Visits for anyone 31 and older to request care online for non-urgent symptoms. For details visit [email or web address].   Also download the MyChart app! Go to the app store, search "MyChart", open the app, select [place], and log in with your MyChart username and password.

## 2023-06-09 LAB — T4: T4, Total: 6.2 ug/dL (ref 4.5–12.0)

## 2023-06-10 ENCOUNTER — Other Ambulatory Visit: Payer: Self-pay

## 2023-06-11 ENCOUNTER — Other Ambulatory Visit: Payer: Self-pay

## 2023-06-20 ENCOUNTER — Other Ambulatory Visit: Payer: Self-pay

## 2023-06-20 ENCOUNTER — Encounter: Payer: Self-pay | Admitting: Oncology

## 2023-06-20 ENCOUNTER — Encounter: Payer: Self-pay | Admitting: Nurse Practitioner

## 2023-06-20 NOTE — Telephone Encounter (Signed)
Telephone call  

## 2023-06-25 ENCOUNTER — Other Ambulatory Visit: Payer: Self-pay | Admitting: Nurse Practitioner

## 2023-06-25 ENCOUNTER — Telehealth: Payer: Self-pay | Admitting: *Deleted

## 2023-06-25 ENCOUNTER — Encounter: Payer: Self-pay | Admitting: Oncology

## 2023-06-25 DIAGNOSIS — C3492 Malignant neoplasm of unspecified part of left bronchus or lung: Secondary | ICD-10-CM

## 2023-06-25 MED ORDER — OXYCODONE HCL 10 MG PO TABS: 10.0000 mg | ORAL_TABLET | Freq: Four times a day (QID) | ORAL | 0 refills | Status: DC | PRN

## 2023-06-25 NOTE — Telephone Encounter (Signed)
MyChart message from daughter requesting refill on oxycodone. Forwarded to NP.

## 2023-06-27 ENCOUNTER — Inpatient Hospital Stay: Payer: Medicare Other

## 2023-06-27 ENCOUNTER — Telehealth: Payer: Self-pay

## 2023-06-27 ENCOUNTER — Encounter: Payer: Self-pay | Admitting: Oncology

## 2023-06-27 ENCOUNTER — Encounter: Payer: Self-pay | Admitting: Nurse Practitioner

## 2023-06-27 ENCOUNTER — Inpatient Hospital Stay (HOSPITAL_BASED_OUTPATIENT_CLINIC_OR_DEPARTMENT_OTHER): Payer: Medicare Other | Admitting: Nurse Practitioner

## 2023-06-27 VITALS — BP 132/59 | HR 73 | Temp 98.2°F | Resp 18 | Ht 65.0 in | Wt 169.0 lb

## 2023-06-27 VITALS — BP 125/58 | HR 61

## 2023-06-27 DIAGNOSIS — Z5112 Encounter for antineoplastic immunotherapy: Secondary | ICD-10-CM | POA: Diagnosis not present

## 2023-06-27 DIAGNOSIS — C3492 Malignant neoplasm of unspecified part of left bronchus or lung: Secondary | ICD-10-CM | POA: Diagnosis not present

## 2023-06-27 LAB — CBC WITH DIFFERENTIAL (CANCER CENTER ONLY)
Abs Immature Granulocytes: 0.02 10*3/uL (ref 0.00–0.07)
Basophils Absolute: 0 10*3/uL (ref 0.0–0.1)
Basophils Relative: 0 %
Eosinophils Absolute: 0 10*3/uL (ref 0.0–0.5)
Eosinophils Relative: 0 %
HCT: 32.2 % — ABNORMAL LOW (ref 36.0–46.0)
Hemoglobin: 10.1 g/dL — ABNORMAL LOW (ref 12.0–15.0)
Immature Granulocytes: 0 %
Lymphocytes Relative: 7 %
Lymphs Abs: 0.4 10*3/uL — ABNORMAL LOW (ref 0.7–4.0)
MCH: 28.7 pg (ref 26.0–34.0)
MCHC: 31.4 g/dL (ref 30.0–36.0)
MCV: 91.5 fL (ref 80.0–100.0)
Monocytes Absolute: 0.4 10*3/uL (ref 0.1–1.0)
Monocytes Relative: 7 %
Neutro Abs: 5.1 10*3/uL (ref 1.7–7.7)
Neutrophils Relative %: 86 %
Platelet Count: 165 10*3/uL (ref 150–400)
RBC: 3.52 MIL/uL — ABNORMAL LOW (ref 3.87–5.11)
RDW: 15.5 % (ref 11.5–15.5)
WBC Count: 5.9 10*3/uL (ref 4.0–10.5)
nRBC: 0 % (ref 0.0–0.2)

## 2023-06-27 LAB — URINALYSIS, COMPLETE (UACMP) WITH MICROSCOPIC
Bilirubin Urine: NEGATIVE
Glucose, UA: NEGATIVE mg/dL
Ketones, ur: NEGATIVE mg/dL
Leukocytes,Ua: NEGATIVE
Nitrite: NEGATIVE
Protein, ur: 30 mg/dL — AB
Specific Gravity, Urine: 1.016 (ref 1.005–1.030)
pH: 6 (ref 5.0–8.0)

## 2023-06-27 LAB — CMP (CANCER CENTER ONLY)
ALT: 16 U/L (ref 0–44)
AST: 22 U/L (ref 15–41)
Albumin: 4.2 g/dL (ref 3.5–5.0)
Alkaline Phosphatase: 77 U/L (ref 38–126)
Anion gap: 9 (ref 5–15)
BUN: 25 mg/dL — ABNORMAL HIGH (ref 8–23)
CO2: 25 mmol/L (ref 22–32)
Calcium: 9.1 mg/dL (ref 8.9–10.3)
Chloride: 104 mmol/L (ref 98–111)
Creatinine: 1.48 mg/dL — ABNORMAL HIGH (ref 0.44–1.00)
GFR, Estimated: 34 mL/min — ABNORMAL LOW
Glucose, Bld: 155 mg/dL — ABNORMAL HIGH (ref 70–99)
Potassium: 3.9 mmol/L (ref 3.5–5.1)
Sodium: 138 mmol/L (ref 135–145)
Total Bilirubin: 0.3 mg/dL
Total Protein: 7.1 g/dL (ref 6.5–8.1)

## 2023-06-27 MED ORDER — HEPARIN SOD (PORK) LOCK FLUSH 100 UNIT/ML IV SOLN
500.0000 [IU] | Freq: Once | INTRAVENOUS | Status: AC | PRN
  Administered 2023-06-27: 500 [IU]

## 2023-06-27 MED ORDER — SODIUM CHLORIDE 0.9 % IV SOLN
200.0000 mg | Freq: Once | INTRAVENOUS | Status: AC
  Administered 2023-06-27: 200 mg via INTRAVENOUS
  Filled 2023-06-27: qty 8

## 2023-06-27 MED ORDER — SODIUM CHLORIDE 0.9 % IV SOLN
Freq: Once | INTRAVENOUS | Status: AC
Start: 2023-06-27 — End: 2023-06-27

## 2023-06-27 MED ORDER — SODIUM CHLORIDE 0.9% FLUSH
10.0000 mL | INTRAVENOUS | Status: DC | PRN
  Administered 2023-06-27: 10 mL

## 2023-06-27 NOTE — Patient Instructions (Signed)
[facility] - [facility]  Discharge Instructions: Thank you for choosing [facility] to provide your oncology and hematology care.   If you have a lab appointment with the Cancer Center, please go directly to the Cancer Center and check in at the registration area.   Wear comfortable clothing and clothing appropriate for easy access to any Portacath or PICC line.   We strive to give you quality time with your provider. You may need to reschedule your appointment if you arrive late (15 or more minutes).  Arriving late affects you and other patients whose appointments are after yours.  Also, if you miss three or more appointments without notifying the office, you may be dismissed from the clinic at the provider's discretion.      For prescription refill requests, have your pharmacy contact our office and allow 72 hours for refills to be completed.    Today you received the following chemotherapy and/or immunotherapy agents Keytruda      To help prevent nausea and vomiting after your treatment, we encourage you to take your nausea medication as directed.  BELOW ARE SYMPTOMS THAT SHOULD BE REPORTED IMMEDIATELY: *FEVER GREATER THAN 100.4 F (38 C) OR HIGHER *CHILLS OR SWEATING *NAUSEA AND VOMITING THAT IS NOT CONTROLLED WITH YOUR NAUSEA MEDICATION *UNUSUAL SHORTNESS OF BREATH *UNUSUAL BRUISING OR BLEEDING *URINARY PROBLEMS (pain or burning when urinating, or frequent urination) *BOWEL PROBLEMS (unusual diarrhea, constipation, pain near the anus) TENDERNESS IN MOUTH AND THROAT WITH OR WITHOUT PRESENCE OF ULCERS (sore throat, sores in mouth, or a toothache) UNUSUAL RASH, SWELLING OR PAIN  UNUSUAL VAGINAL DISCHARGE OR ITCHING   Items with * indicate a potential emergency and should be followed up as soon as possible or go to the Emergency Department if any problems should occur.  Please show the CHEMOTHERAPY ALERT CARD or IMMUNOTHERAPY  ALERT CARD at check-in to the Emergency Department and triage nurse.  Should you have questions after your visit or need to cancel or reschedule your appointment, please contact [facility] - [facility]  Dept: [phone]  and follow the prompts.  Office hours are 8:00 a.m. to 4:30 p.m. Monday - Friday. Please note that voicemails left after 4:00 p.m. may not be returned until the following business day.  We are closed weekends and major holidays. You have access to a nurse at all times for urgent questions. Please call the main number to the clinic Dept: [phone] and follow the prompts.   For any non-urgent questions, you may also contact your provider using MyChart. We now offer e-Visits for anyone 50 and older to request care online for non-urgent symptoms. For details visit [email or web address].   Also download the MyChart app! Go to the app store, search "MyChart", open the app, select [place], and log in with your MyChart username and password.

## 2023-06-27 NOTE — Progress Notes (Signed)
[facility] OFFICE PROGRESS NOTE   Diagnosis: Non-small cell lung cancer  INTERVAL HISTORY:   [name] returns as scheduled.  She completed another cycle of Pembrolizumab 06/08/2023.  She has intermittent loose stools.  No rash.  Periodic nausea.  She continues to have left hip pain.  She takes oxycodone about every 6 hours.  She continues a Duragesic patch.  Objective:  Vital signs in last 24 hours:  Blood pressure (!) 132/59, pulse 73, temperature 98.2 F (36.8 C), temperature source Temporal, resp. rate 18, height 5\' 5"  (1.651 m), weight 169 lb (76.7 kg), SpO2 96%.    HEENT: No thrush or ulcers. Resp: Lungs clear bilaterally. Cardio: Regular rate and rhythm. GI: No hepatosplenomegaly. Vascular: Trace edema lower leg bilaterally left greater than right. Port-A-Cath without erythema.  Lab Results:  Lab Results  Component Value Date   WBC 5.9 06/27/2023   HGB 10.1 (L) 06/27/2023   HCT 32.2 (L) 06/27/2023   MCV 91.5 06/27/2023   PLT 165 06/27/2023   NEUTROABS 5.1 06/27/2023    Imaging:  No results found.  Medications: I have reviewed the patient's current medications.  Assessment/Plan: Non-small cell lung cancer MRI lumbar spine 04/29/2019- enlarging marrow lesions involving the L1 vertebral body, upper left sacrum and right iliac bone MRI pelvis 04/29/2019- 3.5 cm left iliac bone lesion appears slightly larger; other similar appearing lesions present within the left superior pubic ramus, left superior abdomen acetabulum and upper left sacrum Kappa free light chains with mild elevation 05/12/2019  CTs 05/12/2019- left lower lobe pulmonary mass 3.3 x 3.2 cm; lytic process left iliac bone; spinal lesions; 1.1 cm low-density left kidney lesion; right thyroid enlargement with heterogeneous appearance with potential for multiple discrete lesions Biopsy left lower lobe lung mass 05/26/2019-poorly differentiated carcinoma; positive for cytokeratin 5/6, p63  and TTF-1, no EGFR, BRAF, ALK, ERBB2,ROS, or NTRK alteration Cycle 1 carboplatin/Alimta/pembrolizumab 06/06/2019 Cycle 2 carboplatin/Alimta/pembrolizumab 06/27/2019 Cycle 3 carboplatin/Alimta/pembrolizumab 07/18/2019 Cycle 4 carboplatin/Alimta/pembrolizumab 08/07/2019 CTs 08/27/2019-significant decrease in size of lobulated mass left lower lobe.  Unchanged appearance of subtle bone lesions. Cycle 5 Alimta/pembrolizumab 08/28/2019 Cycle 6 Alimta/pembrolizumab 09/18/2019 Cycle 7 Alimta/pembrolizumab 10/09/2019 Cycle 8 Alimta/pembrolizumab 10/30/2019 Cycle 9 Alimta/pembrolizumab 11/20/2019 CTs 12/09/2019-no evidence of disease progression, left lower lobe nodule slightly decreased in size, stable L1, left sacral, and left pubic ramus metastases Cycle 10 Alimta/pembrolizumab 12/11/2019 Cycle 11 pembrolizumab alone 01/02/2020 (Alimta held due to edema, tenderness, erythema at the lower legs) Cycle 12 pembrolizumab 01/23/2020 Cycle 13 pembrolizumab 02/12/2020 Cycle 14 pembrolizumab 03/04/2020 CTs 03/18/2020-stable left lower lobe lesion, mild sclerosis at the superior endplate of L1 that was previously hypermetabolic, stable small left upper sacral lucent lesion, previous left superior pubic ramus lesion is occult on the CT, CT head negative for malignancy Cycle 15 pembrolizumab 03/25/2020 Cycle 16 pembrolizumab 04/21/2020 Cycle 17 pembrolizumab 05/17/2020 05/19/2020 bone scan-no definite abnormalities to suggest osseous metastases.  Areas of concern on prior PET-CT involving left iliac bone and L1 vertebral body showed no abnormalities on the current study Cycle 18 Pembrolizumab 06/10/2020 Cycle 19 Pembrolizumab 06/30/2020 CTs 07/16/2020-stable left lower lobe nodule, stable faint superior L1 vertebral lesion, no evidence of disease progression Cycle 20 pembrolizumab 07/21/2020 Cycle 21 Pembrolizumab 08/11/2020 Cycle 22 pembrolizumab 09/01/2020 Cycle 23 Pembrolizumab 09/22/2020 Cycle 24 pembrolizumab 10/13/2020 Cycle  25 Pembrolizumab 11/03/2020 Cycle 26 pembrolizumab 11/26/2020 CTs 12/15/2020- stable left lower lobe mass, stable sclerotic lesion at L2, no evidence of disease progression Cycle 27 pembrolizumab 12/17/2020 PET scan 01/03/2021-1.8 x 1.3 cm left lower lobe  nodule similar in size to CT of 12/15/2020 and measures smaller than previous PET/CT from 2020.  Nodule is markedly hypermetabolic.  No evidence for hypermetabolic hilar or mediastinal lymphadenopathy.  Several tiny foci of hypermetabolism identified in bony anatomy raising concern for skeletal metastases.  Comparison of the PET to CT 07/16/2020-lobular left lower lobe pulmonary nodule measured 2.4 x 1.5 cm on the prior study, current study it measured 1.8 x 1.3 cm.  Lesion in the anterior left acetabulum is similar to the 07/16/2020 exam although overlying cortical thinning slightly more pronounced on the current study.  Described lesion in the scapula shows some cortical sclerosis and a tiny central marrow lucency not substantially changed compared to 07/16/2020.  Left third rib lesion shows heterogeneous mineralization similar to 07/16/2020. MRI of cervical spine 01/13/2021-increased left facet edema at C5-C6, severe facet arthrosis on the left at C7-T1 and on the right at C3-C4, no evidence of metastatic disease Cycle 28 Pembrolizumab 01/14/2021 Cycle 29 Pembrolizumab 02/03/2021 Cycle 30 Pembrolizumab 02/24/2021 MRI left hip 03/02/2021-compared to 04/29/2019, slight increase in size of metastases at the left iliac and left acetabulum.  Lesion at the left upper sacrum is less conspicuous and a lesion at the left superior pubic ramus is stable Cycle 31 pembrolizumab 03/17/2021 Radiation to left acetabulum and left iliac 03/30/2021-04/13/2021 Cycle 32 pembrolizumab 04/07/2021 Cycle 33 Pembrolizumab 04/28/2021 Cycle 34 pembrolizumab 05/18/2021 PET scan 06/01/2021-no significant change in size or degree of FDG uptake associated with FDG avid left lower lobe lung nodule  compatible with neoplasm.  Stable to improved appearance of multifocal FDG avid bone metastasis. Cycle 35 pembrolizumab 06/10/2021 Cycle 36 pembrolizumab 07/01/2021 Cycle 37 pembrolizumab 07/29/2021 Cycle 38 pembrolizumab 08/19/2021 Cycle 39 Pembrolizumab 09/09/2021 Cycle 40 pembrolizumab 09/30/2021 CT chest 10/20/2021 to evaluate complaints of chest pain-no PE.  Interval increase in left lower lobe mass. Cycle 41 Pembrolizumab 10/21/2021 Cycle 42 pembrolizumab 11/11/2021 PET 11/24/2021-mild increase in size of left lower lobe mass, no evidence of solid organ or nodal metastases, stable multifocal bone metastases.  No new sites of metastatic disease. Cycle 43 pembrolizumab 11/29/2021 Cycle 45 Pembrolizumab 12/20/2021 Cycle 46 Pembrolizumab 01/09/2022 Cycle 47 pembrolizumab 02/01/2022 Cycle 48 Pembrolizumab 02/24/2022 Cycle 49 Pembrolizumab 03/17/2022 Cycle 50 pembrolizumab 04/07/2022 MRI l pelvis 04/12/2022-"new "lesion at the left superior acetabulum, stable bone lesions at the left suprapubic ramus, left S1, and left iliac Cycle 51 pembrolizumab 04/28/2022 Palliative radiation to the left hip and left scapula 04/26/2022-05/09/2022 Cycle 52 pembrolizumab 05/18/2022 PET 06/02/2022-stable left lower lobe tumor, slight increase in hypermetabolism associated with the left acetabular metastasis, additional previously seen bone lesions no longer have hypermetabolism Cycle 53 pembrolizumab 06/08/2022 Cycle 54 pembrolizumab 06/29/2022 Cycle 55 pembrolizumab 07/20/2022 Cycle 56 pembrolizumab 08/10/2022 Cycle 57 pembrolizumab 08/31/2022 Cycle 58 pembrolizumab 09/21/2022 Cycle 59 pembrolizumab 10/12/2022 Cycle 60 Pembrolizumab 11/02/2022 Cycle 61 pembrolizumab 11/23/2022 Cycle 62 Pembrolizumab 12/21/2022 PET scan 12/29/2022-mild increase in size and activity of the left lower lobe mass Cycle 63 Pembrolizumab 01/11/2023 Cycle 64 Pembrolizumab 01/31/2023 Cycle 65 pembrolizumab 02/21/2023 Cycle 66 Pembrolizumab 03/14/2023 CT  pelvis 03/31/2023-mild progressive displacement of the left acetabular fracture, no fracture healing, no new fractures, no new lytic or blastic lesions Cycle 67 Pembrolizumab 04/27/2023 Cycle 68 Pembrolizumab 05/18/2023 Cycle 69 pembrolizumab 06/08/2023 Cycle 70 Pembrolizumab 06/27/2023 Pain secondary to metastatic lung cancer Chronic back pain Type 2 diabetes Essential hypertension CAD Hyperlipidemia Family history significant for multiple members with breast cancer Grade 1 skin rash 07/18/2019 likely related to immunotherapy.  Topical steroid cream as needed. E. coli urinary tract infection  07/14/2019.  Completed cephalexin. Edema/tenderness at the right greater than left ankle 10/21/2019-etiology unclear, potentially related to systemic therapy or an infection, doxycycline prescribed-improved 10/23/2019; marked improvement 11/20/2019; at office visit 01/02/2020 she reports worsening of lower extremity edema, pain/tenderness, erythema 3 to 4 days following each treatment.  Alimta held 01/02/2020.  Referral to dermatology. COVID-19 infection 03/30/2020, monoclonal antibody therapy 04/06/2020 Arthritis, possibly related to Pembrolizumab, improved since beginning Medrol, now followed by Dr. Dierdre Forth Pain left femoral head/upper femur/groin-negative plain x-ray; CT pelvis 03/28/2022-no acute appearing bone abnormality at the left hip or involving the proximal left femur, no evidence of acute fracture or dislocation, soft tissues left hip and left groin unremarkable; osseous mets within the left sacrum, left iliac bone and anterior acetabulum without evidence of associated fracture or dislocation. Percutaneous fixation of left pathologic acetabular fracture 08/23/2022 CT pelvis and left hip 11/02/2022-subacute nondisplaced fracture through the supra-acetabular left ilium extending through the acetabular roof, placement of 2 screws within the left ilium extending to the supra-acetabular region with an additional screw  traversing the left superior pubic ramus, subtle sclerosis in the left ilium and superior left acetabulum, no new site of metastatic disease. CT pelvis 03/31/2023-mild progressive displacement of the left acetabular fracture, no fracture healing   15.  Admission 03/31/2023 after a fall, difficulty with ambulation secondary to generalized weakness and left hip pain 16. acute renal failure and mild rhabdomyolysis on hospital admission 03/31/2023 17.  Anemia secondary to chronic disease and bone marrow involvement with tumor    Disposition: [name] appears stable.  She continues every 3-week Pembrolizumab.  There is no clinical evidence of disease progression.  Plan to proceed with treatment today as scheduled.  Restaging PET scan prior to next office visit.  CBC and chemistry panel reviewed.  Labs adequate to proceed as above.  Creatinine is a little higher than baseline.  We will continue to monitor.  Urinalysis with increased red cells and white cells.  We will check a urine culture.  She will return for follow-up and treatment as scheduled in 3 weeks.  We are available to see her sooner if needed.    Lonna Cobb ANP/GNP-BC   06/27/2023  2:11 PM

## 2023-06-27 NOTE — Patient Instructions (Signed)

## 2023-06-27 NOTE — Progress Notes (Signed)
Patient seen by Lonna Cobb NP today  Vitals are within treatment parameters:Yes   Labs are within treatment parameters: Yes creatinine 1.48 doing an ua  Treatment plan has been signed: Yes   Per physician team, Patient is ready for treatment and there are NO modifications to the treatment plan.

## 2023-06-27 NOTE — Telephone Encounter (Signed)
Clinical Social Worker returned patient's call.  Left a vm with contact information.

## 2023-06-28 ENCOUNTER — Other Ambulatory Visit: Payer: Self-pay

## 2023-06-30 LAB — URINE CULTURE: Culture: 10000 — AB

## 2023-07-01 ENCOUNTER — Other Ambulatory Visit: Payer: Self-pay

## 2023-07-02 ENCOUNTER — Telehealth: Payer: Self-pay

## 2023-07-02 ENCOUNTER — Inpatient Hospital Stay: Payer: Medicare Other | Attending: Oncology

## 2023-07-02 ENCOUNTER — Other Ambulatory Visit: Payer: Self-pay | Admitting: Nurse Practitioner

## 2023-07-02 DIAGNOSIS — C3432 Malignant neoplasm of lower lobe, left bronchus or lung: Secondary | ICD-10-CM | POA: Insufficient documentation

## 2023-07-02 DIAGNOSIS — C3492 Malignant neoplasm of unspecified part of left bronchus or lung: Secondary | ICD-10-CM

## 2023-07-02 DIAGNOSIS — Z87891 Personal history of nicotine dependence: Secondary | ICD-10-CM | POA: Insufficient documentation

## 2023-07-02 DIAGNOSIS — Z79620 Long term (current) use of immunosuppressive biologic: Secondary | ICD-10-CM | POA: Insufficient documentation

## 2023-07-02 DIAGNOSIS — Z5112 Encounter for antineoplastic immunotherapy: Secondary | ICD-10-CM | POA: Insufficient documentation

## 2023-07-02 DIAGNOSIS — C7951 Secondary malignant neoplasm of bone: Secondary | ICD-10-CM | POA: Insufficient documentation

## 2023-07-02 DIAGNOSIS — N390 Urinary tract infection, site not specified: Secondary | ICD-10-CM

## 2023-07-02 MED ORDER — CIPROFLOXACIN HCL 250 MG PO TABS: 250.0000 mg | ORAL_TABLET | Freq: Two times a day (BID) | ORAL | 0 refills | Status: AC

## 2023-07-02 NOTE — Telephone Encounter (Signed)
Patient is schedule on 07/10/23 at [facility] for her PET scan

## 2023-07-02 NOTE — Telephone Encounter (Signed)
-----   Message from Lonna Cobb sent at 06/27/2023  3:07 PM EST ----- Please schedule for PET scan, order is in

## 2023-07-02 NOTE — Telephone Encounter (Signed)
-----   Message from Lonna Cobb sent at 07/02/2023 12:54 PM EST ----- Please call her-the urine culture returned positive.  I sent a prescription to her pharmacy for ciprofloxacin.

## 2023-07-02 NOTE — Telephone Encounter (Signed)
Patient gave verbal understanding and had no further questions or concerns  

## 2023-07-02 NOTE — Progress Notes (Signed)
[facility] CSW Counseling Note  Patient was referred by nurse. Treatment type: Individual  Presenting Concerns: Patient and/or family reports the following symptoms/concerns: depression Duration of problem: 5 months; Severity of problem: moderate   Orientation:oriented to person, place, time/date, situation, day of week, month of year, and year.   Affect:  Sounded tearful on the phone. Risk of harm to self or others: No plan to harm self or others  Patient and/or Family's Strengths/Protective Factors: Concrete supports in place (healthy food, safe environments, etc.)Active sense of humor  Average or above average intelligence  Communication skills  Financial means  General fund of knowledge  Religious Affiliation  Supportive family/friends      Goals Addressed: Patient will:  Reduce symptoms of: depression Increase knowledge and/or ability of: self-management skills  Increase healthy adjustment to current life circumstances   Progress towards Goals: Progressing   Interventions: Interventions utilized:  CBT, Strength-based, and Supportive      Assessment: Patient currently experiencing grief.  Patient reports increased depressive symptoms on Thanksgiving.  This was the first major holiday in which her husband was not present.  She was with her daughter and her family and received support.  Discussed patient's feelings regarding having to depend on her daughter, as well as, patient's continued loss of independence.  Physical therapy arrived at patient's home, the the session ended early.      Plan: Follow up with CSW: TBD Behavioral recommendations: Continue attending Al-Anon. Referral(s): None at this time.       Pascal Lux Deyci Gesell, LCSW

## 2023-07-05 ENCOUNTER — Telehealth: Payer: Self-pay | Admitting: *Deleted

## 2023-07-05 NOTE — Telephone Encounter (Signed)
Daughter called w/concern that she thinks Delayni is severely depressed. She has no interest in anything, is very withdrawn, cries often and then can be angry and hostile and mean. Says nothing is right with her life. She know she is also grieving the loss of her husband and this is her first holiday season without him. She is asking about an antidepressant? She does not want her mom to know she made this call. Will ask CSW to reach out to her again since her visit on 12/2 was cut short by physical therapy coming into her room and ask her to feel her out about an antidepressant. She also has persistent nausea, almost all the time. Was wondering if increase in patch and less oxycodone would help that issue? Told her this type of change would best be done at an office visit discussion with patient.

## 2023-07-06 ENCOUNTER — Inpatient Hospital Stay: Payer: Medicare Other

## 2023-07-06 ENCOUNTER — Encounter: Payer: Self-pay | Admitting: *Deleted

## 2023-07-06 NOTE — Progress Notes (Signed)
[facility] CSW Counseling Note  Patient was referred by nurse. Treatment type: Individual  Presenting Concerns: Patient and/or family reports the following symptoms/concerns: depression Duration of problem: 5 months; Severity of problem: moderate   Orientation:oriented to person, place, time/date, situation, day of week, month of year, and year.   Affect: Congruent Risk of harm to self or others: No plan to harm self or others  Patient and/or Family's Strengths/Protective Factors: Concrete supports in place (healthy food, safe environments, etc.)Average or above average intelligence  Communication skills  Financial means  General fund of knowledge  Religious Affiliation  Supportive family/friends      Goals Addressed: Patient will:  Reduce symptoms of: depression Increase knowledge and/or ability of: self-management skills  Increase healthy adjustment to current life circumstances   Progress towards Goals: Progressing   Interventions: Interventions utilized:  CBT, Strength-based, and Supportive      Assessment: Patient currently experiencing grief.  She reported that she could not stop crying yesterday.  She describes her grief as a knot in her stomach.  Her symptoms have improved today and she is going out to lunch with her daughter.  She also engages in conversation with her neighbors at the assisted living. Explored open communication with [place].  Patient stated she was open to taking an antidepressant.  CSW to inform Lonna Cobb, NP.      Plan: Follow up with CSW: Next week. Behavioral recommendations: Continue attending support group, express feelings to daughter. Referral(s): Other medical:  Lonna Cobb, NP.       Pascal Lux Suzzanne Brunkhorst, LCSW

## 2023-07-09 ENCOUNTER — Other Ambulatory Visit: Payer: Self-pay | Admitting: Nurse Practitioner

## 2023-07-09 ENCOUNTER — Encounter: Payer: Self-pay | Admitting: Oncology

## 2023-07-09 DIAGNOSIS — C3492 Malignant neoplasm of unspecified part of left bronchus or lung: Secondary | ICD-10-CM

## 2023-07-09 MED ORDER — FENTANYL 25 MCG/HR TD PT72: 1.0000 | MEDICATED_PATCH | TRANSDERMAL | 0 refills | Status: DC

## 2023-07-10 ENCOUNTER — Encounter (HOSPITAL_COMMUNITY): Admission: RE | Admit: 2023-07-10 | Payer: Medicare Other | Source: Ambulatory Visit

## 2023-07-10 ENCOUNTER — Other Ambulatory Visit: Payer: Self-pay

## 2023-07-11 ENCOUNTER — Telehealth: Payer: Self-pay | Admitting: *Deleted

## 2023-07-11 NOTE — Telephone Encounter (Addendum)
[name] had session with CSW and has agreed to try an antidepressant. Admits to depression, tearful and angry at times. Dr. Truett Perna requests PCP to manage this since she is on multiple medications and he is concerned about drug interactions.  Confirmed with [name] at [facility] that facility provider does not managed the assisted living population-patients still see PCP. Faxed request for antidepressant management to PCP office. Informed [name] of above discussions and she is in agreement. Encouraged her to try and get involved with activities there as much as possible.

## 2023-07-12 ENCOUNTER — Ambulatory Visit: Payer: Medicare Other | Admitting: Podiatry

## 2023-07-12 ENCOUNTER — Telehealth: Payer: Self-pay

## 2023-07-12 ENCOUNTER — Other Ambulatory Visit: Payer: Self-pay

## 2023-07-12 ENCOUNTER — Other Ambulatory Visit: Payer: Self-pay | Admitting: Nurse Practitioner

## 2023-07-12 DIAGNOSIS — C3492 Malignant neoplasm of unspecified part of left bronchus or lung: Secondary | ICD-10-CM

## 2023-07-12 MED ORDER — OXYCODONE HCL 10 MG PO TABS: 10.0000 mg | ORAL_TABLET | Freq: Four times a day (QID) | ORAL | 0 refills | Status: DC | PRN

## 2023-07-12 NOTE — Telephone Encounter (Signed)
The patient's daughter called to request a refill of her oxycodone 10 mg prescription. I have submitted the request to the Nurse Practitioner's desk for review.

## 2023-07-13 ENCOUNTER — Encounter (HOSPITAL_COMMUNITY)
Admission: RE | Admit: 2023-07-13 | Discharge: 2023-07-13 | Disposition: A | Discharge status: Home / Self Care | Payer: Medicare Other | Source: Ambulatory Visit | Attending: Nurse Practitioner | Admitting: Nurse Practitioner

## 2023-07-13 DIAGNOSIS — C3492 Malignant neoplasm of unspecified part of left bronchus or lung: Secondary | ICD-10-CM | POA: Diagnosis present

## 2023-07-13 LAB — GLUCOSE, CAPILLARY: Glucose-Capillary: 151 mg/dL — ABNORMAL HIGH (ref 70–99)

## 2023-07-13 MED ORDER — FLUDEOXYGLUCOSE F - 18 (FDG) INJECTION
8.4500 | Freq: Once | INTRAVENOUS | Status: AC
  Administered 2023-07-13: 8.42 via INTRAVENOUS

## 2023-07-15 ENCOUNTER — Other Ambulatory Visit: Payer: Self-pay | Admitting: Oncology

## 2023-07-16 ENCOUNTER — Telehealth: Payer: Self-pay

## 2023-07-16 NOTE — Telephone Encounter (Signed)
Clinical Social Worker attempted to contact patient to assess needs and to provide supportive counseling.  Was unable to leave a vm.

## 2023-07-18 ENCOUNTER — Ambulatory Visit: Payer: Medicare Other | Admitting: Cardiovascular Disease

## 2023-07-19 ENCOUNTER — Other Ambulatory Visit: Payer: Medicare Other

## 2023-07-19 ENCOUNTER — Inpatient Hospital Stay: Payer: Medicare Other

## 2023-07-19 ENCOUNTER — Encounter: Payer: Self-pay | Admitting: Nurse Practitioner

## 2023-07-19 ENCOUNTER — Inpatient Hospital Stay: Payer: Medicare Other | Admitting: Nurse Practitioner

## 2023-07-19 ENCOUNTER — Telehealth: Payer: Self-pay

## 2023-07-19 VITALS — BP 130/60 | HR 56 | Temp 98.1°F | Resp 18 | Ht 65.0 in | Wt 171.0 lb

## 2023-07-19 DIAGNOSIS — C3492 Malignant neoplasm of unspecified part of left bronchus or lung: Secondary | ICD-10-CM | POA: Diagnosis not present

## 2023-07-19 DIAGNOSIS — Z5112 Encounter for antineoplastic immunotherapy: Secondary | ICD-10-CM | POA: Diagnosis present

## 2023-07-19 DIAGNOSIS — Z87891 Personal history of nicotine dependence: Secondary | ICD-10-CM | POA: Diagnosis not present

## 2023-07-19 DIAGNOSIS — C3432 Malignant neoplasm of lower lobe, left bronchus or lung: Secondary | ICD-10-CM | POA: Diagnosis present

## 2023-07-19 DIAGNOSIS — C7951 Secondary malignant neoplasm of bone: Secondary | ICD-10-CM | POA: Diagnosis present

## 2023-07-19 DIAGNOSIS — Z79620 Long term (current) use of immunosuppressive biologic: Secondary | ICD-10-CM | POA: Diagnosis not present

## 2023-07-19 LAB — CBC WITH DIFFERENTIAL (CANCER CENTER ONLY)
Abs Immature Granulocytes: 0.01 10*3/uL (ref 0.00–0.07)
Basophils Absolute: 0 10*3/uL (ref 0.0–0.1)
Basophils Relative: 0 %
Eosinophils Absolute: 0.1 10*3/uL (ref 0.0–0.5)
Eosinophils Relative: 2 %
HCT: 31 % — ABNORMAL LOW (ref 36.0–46.0)
Hemoglobin: 9.8 g/dL — ABNORMAL LOW (ref 12.0–15.0)
Immature Granulocytes: 0 %
Lymphocytes Relative: 16 %
Lymphs Abs: 0.8 10*3/uL (ref 0.7–4.0)
MCH: 28.6 pg (ref 26.0–34.0)
MCHC: 31.6 g/dL (ref 30.0–36.0)
MCV: 90.4 fL (ref 80.0–100.0)
Monocytes Absolute: 0.5 10*3/uL (ref 0.1–1.0)
Monocytes Relative: 10 %
Neutro Abs: 3.4 10*3/uL (ref 1.7–7.7)
Neutrophils Relative %: 72 %
Platelet Count: 147 10*3/uL — ABNORMAL LOW (ref 150–400)
RBC: 3.43 MIL/uL — ABNORMAL LOW (ref 3.87–5.11)
RDW: 15 % (ref 11.5–15.5)
WBC Count: 4.8 10*3/uL (ref 4.0–10.5)
nRBC: 0 % (ref 0.0–0.2)

## 2023-07-19 LAB — TSH: TSH: 1.442 u[IU]/mL (ref 0.350–4.500)

## 2023-07-19 LAB — CMP (CANCER CENTER ONLY)
ALT: 14 U/L (ref 0–44)
AST: 20 U/L (ref 15–41)
Albumin: 4 g/dL (ref 3.5–5.0)
Alkaline Phosphatase: 66 U/L (ref 38–126)
Anion gap: 6 (ref 5–15)
BUN: 26 mg/dL — ABNORMAL HIGH (ref 8–23)
CO2: 29 mmol/L (ref 22–32)
Calcium: 9.4 mg/dL (ref 8.9–10.3)
Chloride: 101 mmol/L (ref 98–111)
Creatinine: 1.56 mg/dL — ABNORMAL HIGH (ref 0.44–1.00)
GFR, Estimated: 32 mL/min — ABNORMAL LOW (ref >60)
Glucose, Bld: 108 mg/dL — ABNORMAL HIGH (ref 70–99)
Potassium: 3.7 mmol/L (ref 3.5–5.1)
Sodium: 136 mmol/L (ref 135–145)
Total Bilirubin: 0.4 mg/dL (ref <1.2)
Total Protein: 6.8 g/dL (ref 6.5–8.1)

## 2023-07-19 MED ORDER — SODIUM CHLORIDE 0.9% FLUSH
10.0000 mL | INTRAVENOUS | Status: DC | PRN
  Administered 2023-07-19: 10 mL

## 2023-07-19 MED ORDER — SODIUM CHLORIDE 0.9 % IV SOLN: Freq: Once | INTRAVENOUS | Status: AC

## 2023-07-19 MED ORDER — SODIUM CHLORIDE 0.9 % IV SOLN
200.0000 mg | Freq: Once | INTRAVENOUS | Status: AC
  Administered 2023-07-19: 200 mg via INTRAVENOUS
  Filled 2023-07-19: qty 8

## 2023-07-19 MED ORDER — HEPARIN SOD (PORK) LOCK FLUSH 100 UNIT/ML IV SOLN
500.0000 [IU] | Freq: Once | INTRAVENOUS | Status: AC | PRN
Start: 2023-07-19 — End: 2023-07-19
  Administered 2023-07-19: 500 [IU]

## 2023-07-19 NOTE — Patient Instructions (Signed)
[facility]  Discharge Instructions: Thank you for choosing [facility] to provide your oncology and hematology care.   If you have a lab appointment with the Cancer Center, please go directly to the Cancer Center and check in at the registration area.   Wear comfortable clothing and clothing appropriate for easy access to any Portacath or PICC line.   We strive to give you quality time with your provider. You may need to reschedule your appointment if you arrive late (15 or more minutes).  Arriving late affects you and other patients whose appointments are after yours.  Also, if you miss three or more appointments without notifying the office, you may be dismissed from the clinic at the provider's discretion.      For prescription refill requests, have your pharmacy contact our office and allow 72 hours for refills to be completed.    Today you received the following chemotherapy and/or immunotherapy agents Keytruda      To help prevent nausea and vomiting after your treatment, we encourage you to take your nausea medication as directed.  BELOW ARE SYMPTOMS THAT SHOULD BE REPORTED IMMEDIATELY: *FEVER GREATER THAN 100.4 F (38 C) OR HIGHER *CHILLS OR SWEATING *NAUSEA AND VOMITING THAT IS NOT CONTROLLED WITH YOUR NAUSEA MEDICATION *UNUSUAL SHORTNESS OF BREATH *UNUSUAL BRUISING OR BLEEDING *URINARY PROBLEMS (pain or burning when urinating, or frequent urination) *BOWEL PROBLEMS (unusual diarrhea, constipation, pain near the anus) TENDERNESS IN MOUTH AND THROAT WITH OR WITHOUT PRESENCE OF ULCERS (sore throat, sores in mouth, or a toothache) UNUSUAL RASH, SWELLING OR PAIN  UNUSUAL VAGINAL DISCHARGE OR ITCHING   Items with * indicate a potential emergency and should be followed up as soon as possible or go to the Emergency Department if any problems should occur.  Please show the CHEMOTHERAPY ALERT CARD or IMMUNOTHERAPY  ALERT CARD at check-in to the Emergency Department and triage nurse.  Should you have questions after your visit or need to cancel or reschedule your appointment, please contact [facility]  Dept: [phone]  and follow the prompts.  Office hours are 8:00 a.m. to 4:30 p.m. Monday - Friday. Please note that voicemails left after 4:00 p.m. may not be returned until the following business day.  We are closed weekends and major holidays. You have access to a nurse at all times for urgent questions. Please call the main number to the clinic Dept: [phone] and follow the prompts.   For any non-urgent questions, you may also contact your provider using MyChart. We now offer e-Visits for anyone 10 and older to request care online for non-urgent symptoms. For details visit [email or web address].   Also download the MyChart app! Go to the app store, search "MyChart", open the app, select [facility], and log in with your MyChart username and password.

## 2023-07-19 NOTE — Progress Notes (Signed)
Patient seen by Lonna Cobb NP today  Vitals are within treatment parameters:Yes   Labs are within treatment parameters: No (Please specify and give further instructions.) creatinine 1.56 stopping lasix   Treatment plan has been signed: Yes   Per physician team, Patient is ready for treatment and there are NO modifications to the treatment plan.

## 2023-07-19 NOTE — Telephone Encounter (Signed)
I contacted [facility] to speak with the nurse regarding the discontinuation of Furosemide. The nurse requested that I fax the order to [phone].

## 2023-07-19 NOTE — Progress Notes (Signed)
[facility] OFFICE PROGRESS NOTE   Diagnosis: Non-small cell lung cancer  INTERVAL HISTORY:   [name] returns as scheduled.  She completed another cycle of Pembrolizumab 06/27/2023.  No rash or diarrhea.  No nausea or vomiting.  She has a good appetite.  She tried Lexapro 5 mg for depression but did not like how it made her feel.  She feels overall in terms of depression she is doing better.  Objective:  Vital signs in last 24 hours:  Blood pressure 130/60, pulse (!) 56, temperature 98.1 F (36.7 C), resp. rate 18, height 5\' 5"  (1.651 m), weight 171 lb (77.6 kg), SpO2 100%.    HEENT: No thrush or ulcers. Resp: Lungs clear bilaterally. Cardio: Regular rate and rhythm. GI: No hepatosplenomegaly. Vascular: Firm edema lower leg bilaterally. Neuro: Alert and oriented. Skin: No rash. Port-A-Cath without erythema.  No  Lab Results:  Lab Results  Component Value Date   WBC 4.8 07/19/2023   HGB 9.8 (L) 07/19/2023   HCT 31.0 (L) 07/19/2023   MCV 90.4 07/19/2023   PLT 147 (L) 07/19/2023   NEUTROABS 3.4 07/19/2023    Imaging:  No results found.  Medications: I have reviewed the patient's current medications.  Assessment/Plan: Non-small cell lung cancer MRI lumbar spine 04/29/2019- enlarging marrow lesions involving the L1 vertebral body, upper left sacrum and right iliac bone MRI pelvis 04/29/2019- 3.5 cm left iliac bone lesion appears slightly larger; other similar appearing lesions present within the left superior pubic ramus, left superior abdomen acetabulum and upper left sacrum Kappa free light chains with mild elevation 05/12/2019  CTs 05/12/2019- left lower lobe pulmonary mass 3.3 x 3.2 cm; lytic process left iliac bone; spinal lesions; 1.1 cm low-density left kidney lesion; right thyroid enlargement with heterogeneous appearance with potential for multiple discrete lesions Biopsy left lower lobe lung mass 05/26/2019-poorly differentiated carcinoma;  positive for cytokeratin 5/6, p63 and TTF-1, no EGFR, BRAF, ALK, ERBB2,ROS, or NTRK alteration Cycle 1 carboplatin/Alimta/pembrolizumab 06/06/2019 Cycle 2 carboplatin/Alimta/pembrolizumab 06/27/2019 Cycle 3 carboplatin/Alimta/pembrolizumab 07/18/2019 Cycle 4 carboplatin/Alimta/pembrolizumab 08/07/2019 CTs 08/27/2019-significant decrease in size of lobulated mass left lower lobe.  Unchanged appearance of subtle bone lesions. Cycle 5 Alimta/pembrolizumab 08/28/2019 Cycle 6 Alimta/pembrolizumab 09/18/2019 Cycle 7 Alimta/pembrolizumab 10/09/2019 Cycle 8 Alimta/pembrolizumab 10/30/2019 Cycle 9 Alimta/pembrolizumab 11/20/2019 CTs 12/09/2019-no evidence of disease progression, left lower lobe nodule slightly decreased in size, stable L1, left sacral, and left pubic ramus metastases Cycle 10 Alimta/pembrolizumab 12/11/2019 Cycle 11 pembrolizumab alone 01/02/2020 (Alimta held due to edema, tenderness, erythema at the lower legs) Cycle 12 pembrolizumab 01/23/2020 Cycle 13 pembrolizumab 02/12/2020 Cycle 14 pembrolizumab 03/04/2020 CTs 03/18/2020-stable left lower lobe lesion, mild sclerosis at the superior endplate of L1 that was previously hypermetabolic, stable small left upper sacral lucent lesion, previous left superior pubic ramus lesion is occult on the CT, CT head negative for malignancy Cycle 15 pembrolizumab 03/25/2020 Cycle 16 pembrolizumab 04/21/2020 Cycle 17 pembrolizumab 05/17/2020 05/19/2020 bone scan-no definite abnormalities to suggest osseous metastases.  Areas of concern on prior PET-CT involving left iliac bone and L1 vertebral body showed no abnormalities on the current study Cycle 18 Pembrolizumab 06/10/2020 Cycle 19 Pembrolizumab 06/30/2020 CTs 07/16/2020-stable left lower lobe nodule, stable faint superior L1 vertebral lesion, no evidence of disease progression Cycle 20 pembrolizumab 07/21/2020 Cycle 21 Pembrolizumab 08/11/2020 Cycle 22 pembrolizumab 09/01/2020 Cycle 23 Pembrolizumab 09/22/2020 Cycle  24 pembrolizumab 10/13/2020 Cycle 25 Pembrolizumab 11/03/2020 Cycle 26 pembrolizumab 11/26/2020 CTs 12/15/2020- stable left lower lobe mass, stable sclerotic lesion at L2, no evidence of disease  progression Cycle 27 pembrolizumab 12/17/2020 PET scan 01/03/2021-1.8 x 1.3 cm left lower lobe nodule similar in size to CT of 12/15/2020 and measures smaller than previous PET/CT from 2020.  Nodule is markedly hypermetabolic.  No evidence for hypermetabolic hilar or mediastinal lymphadenopathy.  Several tiny foci of hypermetabolism identified in bony anatomy raising concern for skeletal metastases.  Comparison of the PET to CT 07/16/2020-lobular left lower lobe pulmonary nodule measured 2.4 x 1.5 cm on the prior study, current study it measured 1.8 x 1.3 cm.  Lesion in the anterior left acetabulum is similar to the 07/16/2020 exam although overlying cortical thinning slightly more pronounced on the current study.  Described lesion in the scapula shows some cortical sclerosis and a tiny central marrow lucency not substantially changed compared to 07/16/2020.  Left third rib lesion shows heterogeneous mineralization similar to 07/16/2020. MRI of cervical spine 01/13/2021-increased left facet edema at C5-C6, severe facet arthrosis on the left at C7-T1 and on the right at C3-C4, no evidence of metastatic disease Cycle 28 Pembrolizumab 01/14/2021 Cycle 29 Pembrolizumab 02/03/2021 Cycle 30 Pembrolizumab 02/24/2021 MRI left hip 03/02/2021-compared to 04/29/2019, slight increase in size of metastases at the left iliac and left acetabulum.  Lesion at the left upper sacrum is less conspicuous and a lesion at the left superior pubic ramus is stable Cycle 31 pembrolizumab 03/17/2021 Radiation to left acetabulum and left iliac 03/30/2021-04/13/2021 Cycle 32 pembrolizumab 04/07/2021 Cycle 33 Pembrolizumab 04/28/2021 Cycle 34 pembrolizumab 05/18/2021 PET scan 06/01/2021-no significant change in size or degree of FDG uptake associated with FDG  avid left lower lobe lung nodule compatible with neoplasm.  Stable to improved appearance of multifocal FDG avid bone metastasis. Cycle 35 pembrolizumab 06/10/2021 Cycle 36 pembrolizumab 07/01/2021 Cycle 37 pembrolizumab 07/29/2021 Cycle 38 pembrolizumab 08/19/2021 Cycle 39 Pembrolizumab 09/09/2021 Cycle 40 pembrolizumab 09/30/2021 CT chest 10/20/2021 to evaluate complaints of chest pain-no PE.  Interval increase in left lower lobe mass. Cycle 41 Pembrolizumab 10/21/2021 Cycle 42 pembrolizumab 11/11/2021 PET 11/24/2021-mild increase in size of left lower lobe mass, no evidence of solid organ or nodal metastases, stable multifocal bone metastases.  No new sites of metastatic disease. Cycle 43 pembrolizumab 11/29/2021 Cycle 45 Pembrolizumab 12/20/2021 Cycle 46 Pembrolizumab 01/09/2022 Cycle 47 pembrolizumab 02/01/2022 Cycle 48 Pembrolizumab 02/24/2022 Cycle 49 Pembrolizumab 03/17/2022 Cycle 50 pembrolizumab 04/07/2022 MRI l pelvis 04/12/2022-"new "lesion at the left superior acetabulum, stable bone lesions at the left suprapubic ramus, left S1, and left iliac Cycle 51 pembrolizumab 04/28/2022 Palliative radiation to the left hip and left scapula 04/26/2022-05/09/2022 Cycle 52 pembrolizumab 05/18/2022 PET 06/02/2022-stable left lower lobe tumor, slight increase in hypermetabolism associated with the left acetabular metastasis, additional previously seen bone lesions no longer have hypermetabolism Cycle 53 pembrolizumab 06/08/2022 Cycle 54 pembrolizumab 06/29/2022 Cycle 55 pembrolizumab 07/20/2022 Cycle 56 pembrolizumab 08/10/2022 Cycle 57 pembrolizumab 08/31/2022 Cycle 58 pembrolizumab 09/21/2022 Cycle 59 pembrolizumab 10/12/2022 Cycle 60 Pembrolizumab 11/02/2022 Cycle 61 pembrolizumab 11/23/2022 Cycle 62 Pembrolizumab 12/21/2022 PET scan 12/29/2022-mild increase in size and activity of the left lower lobe mass Cycle 63 Pembrolizumab 01/11/2023 Cycle 64 Pembrolizumab 01/31/2023 Cycle 65 pembrolizumab 02/21/2023 Cycle 66  Pembrolizumab 03/14/2023 CT pelvis 03/31/2023-mild progressive displacement of the left acetabular fracture, no fracture healing, no new fractures, no new lytic or blastic lesions Cycle 67 Pembrolizumab 04/27/2023 Cycle 68 Pembrolizumab 05/18/2023 Cycle 69 pembrolizumab 06/08/2023 Cycle 70 Pembrolizumab 06/27/2023 Cycle 71 Pembrolizumab 07/19/2023 Pain secondary to metastatic lung cancer Chronic back pain Type 2 diabetes Essential hypertension CAD Hyperlipidemia Family history significant for multiple members with breast cancer Grade 1  skin rash 07/18/2019 likely related to immunotherapy.  Topical steroid cream as needed. E. coli urinary tract infection 07/14/2019.  Completed cephalexin. Edema/tenderness at the right greater than left ankle 10/21/2019-etiology unclear, potentially related to systemic therapy or an infection, doxycycline prescribed-improved 10/23/2019; marked improvement 11/20/2019; at office visit 01/02/2020 she reports worsening of lower extremity edema, pain/tenderness, erythema 3 to 4 days following each treatment.  Alimta held 01/02/2020.  Referral to dermatology. COVID-19 infection 03/30/2020, monoclonal antibody therapy 04/06/2020 Arthritis, possibly related to Pembrolizumab, improved since beginning Medrol, now followed by Dr. Dierdre Forth Pain left femoral head/upper femur/groin-negative plain x-ray; CT pelvis 03/28/2022-no acute appearing bone abnormality at the left hip or involving the proximal left femur, no evidence of acute fracture or dislocation, soft tissues left hip and left groin unremarkable; osseous mets within the left sacrum, left iliac bone and anterior acetabulum without evidence of associated fracture or dislocation. Percutaneous fixation of left pathologic acetabular fracture 08/23/2022 CT pelvis and left hip 11/02/2022-subacute nondisplaced fracture through the supra-acetabular left ilium extending through the acetabular roof, placement of 2 screws within the left ilium  extending to the supra-acetabular region with an additional screw traversing the left superior pubic ramus, subtle sclerosis in the left ilium and superior left acetabulum, no new site of metastatic disease. CT pelvis 03/31/2023-mild progressive displacement of the left acetabular fracture, no fracture healing   15.  Admission 03/31/2023 after a fall, difficulty with ambulation secondary to generalized weakness and left hip pain 16. acute renal failure and mild rhabdomyolysis on hospital admission 03/31/2023 17.  Anemia secondary to chronic disease and bone marrow involvement with tumor  Disposition: Ms. Stuckman appears stable.  She continues pembrolizumab every 3 weeks.  Final PET scan report is not available.  Per our review there is stable disease.  She agrees with the recommendation to continue Pembrolizumab.  We reviewed the CBC and chemistry panel from today.  Labs are adequate for treatment.  There has been a progressive rise in the creatinine over the past 2 months.  This may be related to beginning Lasix in September for leg edema.  She will discontinue Lasix.  She will return for follow-up and treatment in 3 weeks.  Patient seen with Dr. Truett [name].  [name] ANP/GNP-BC   07/19/2023  12:53 PM This was a shared visit with [name].  We reviewed the restaging PET findings and images [name] .  There is no clinical or radiologic evidence of disease progression.  The mild elevation of creatinine may be related to diuretic therapy.  I recommend continuing pembrolizumab.  She is in agreement.  I was present for greater than 50% of today's visit.  I performed medical decision making.  Mancel Bale, MD

## 2023-07-20 LAB — T4: T4, Total: 6.4 ug/dL (ref 4.5–12.0)

## 2023-07-30 ENCOUNTER — Telehealth: Payer: Self-pay

## 2023-07-30 NOTE — Telephone Encounter (Signed)
The patient's daughter has contacted [place] to report that the patient is experiencing swelling in her lower legs, ankles, and feet. The patient is also reporting significant pain when attempting to stand. During her last visit, her Lasix prescription was discontinued. Please provide guidance on how to proceed.

## 2023-07-31 ENCOUNTER — Ambulatory Visit (INDEPENDENT_AMBULATORY_CARE_PROVIDER_SITE_OTHER): Payer: Medicare Other | Admitting: Podiatry

## 2023-07-31 ENCOUNTER — Encounter: Payer: Self-pay | Admitting: Podiatry

## 2023-07-31 ENCOUNTER — Other Ambulatory Visit: Payer: Self-pay | Admitting: Nurse Practitioner

## 2023-07-31 ENCOUNTER — Other Ambulatory Visit: Payer: Self-pay

## 2023-07-31 DIAGNOSIS — M79676 Pain in unspecified toe(s): Secondary | ICD-10-CM

## 2023-07-31 DIAGNOSIS — B351 Tinea unguium: Secondary | ICD-10-CM | POA: Diagnosis not present

## 2023-07-31 DIAGNOSIS — C3492 Malignant neoplasm of unspecified part of left bronchus or lung: Secondary | ICD-10-CM

## 2023-07-31 MED ORDER — OXYCODONE HCL 10 MG PO TABS: 10.0000 mg | ORAL_TABLET | Freq: Four times a day (QID) | ORAL | 0 refills | Status: DC | PRN

## 2023-07-31 NOTE — Telephone Encounter (Signed)
Patient's daughter request a refill of her Oxycodone, I placed the request on the Np's desk.

## 2023-07-31 NOTE — Progress Notes (Signed)
 She presents today for follow-up of her nail trim.  She states that toenails are starting to get thick again.  Objective: Vital signs are stable alert oriented x 3.  Pulses are palpable.  Neurologic sensorium is intact.  Toenails are long thick yellow dystrophic and mycotic.  Assessment: Pain with sitting onychomycosis.  Plan: Debridement of toenails 1 through 5 bilateral.

## 2023-08-01 ENCOUNTER — Other Ambulatory Visit: Payer: Self-pay

## 2023-08-01 ENCOUNTER — Other Ambulatory Visit: Payer: Self-pay | Admitting: Hematology and Oncology

## 2023-08-01 DIAGNOSIS — C3492 Malignant neoplasm of unspecified part of left bronchus or lung: Secondary | ICD-10-CM

## 2023-08-01 MED ORDER — OXYCODONE HCL 10 MG PO TABS: 10.0000 mg | ORAL_TABLET | Freq: Four times a day (QID) | ORAL | 0 refills | Status: DC | PRN

## 2023-08-01 NOTE — Progress Notes (Signed)
 Pt called last night through answering service. Apparently she was unable to fill pain meds and didn't have enough for today, pharmacy closed early for New yrs' eve, hence 6 pills dispensed to 24 hr pharmacy of her choice to get her through the holiday.  [name]

## 2023-08-02 ENCOUNTER — Inpatient Hospital Stay: Payer: Medicare Other | Admitting: Nurse Practitioner

## 2023-08-02 ENCOUNTER — Other Ambulatory Visit: Payer: Self-pay

## 2023-08-02 ENCOUNTER — Encounter: Payer: Self-pay | Admitting: Oncology

## 2023-08-02 ENCOUNTER — Encounter: Payer: Self-pay | Admitting: Nurse Practitioner

## 2023-08-02 ENCOUNTER — Inpatient Hospital Stay: Payer: HMO

## 2023-08-02 ENCOUNTER — Inpatient Hospital Stay: Payer: HMO | Attending: Oncology

## 2023-08-02 VITALS — BP 137/59 | HR 58 | Temp 98.1°F | Resp 18 | Ht 65.0 in | Wt 171.6 lb

## 2023-08-02 DIAGNOSIS — Z87891 Personal history of nicotine dependence: Secondary | ICD-10-CM | POA: Diagnosis not present

## 2023-08-02 DIAGNOSIS — D649 Anemia, unspecified: Secondary | ICD-10-CM

## 2023-08-02 DIAGNOSIS — R2681 Unsteadiness on feet: Secondary | ICD-10-CM | POA: Diagnosis not present

## 2023-08-02 DIAGNOSIS — M6281 Muscle weakness (generalized): Secondary | ICD-10-CM | POA: Diagnosis not present

## 2023-08-02 DIAGNOSIS — C3432 Malignant neoplasm of lower lobe, left bronchus or lung: Secondary | ICD-10-CM | POA: Diagnosis not present

## 2023-08-02 DIAGNOSIS — C3492 Malignant neoplasm of unspecified part of left bronchus or lung: Secondary | ICD-10-CM

## 2023-08-02 DIAGNOSIS — Z95828 Presence of other vascular implants and grafts: Secondary | ICD-10-CM

## 2023-08-02 DIAGNOSIS — N179 Acute kidney failure, unspecified: Secondary | ICD-10-CM | POA: Diagnosis not present

## 2023-08-02 DIAGNOSIS — R609 Edema, unspecified: Secondary | ICD-10-CM | POA: Insufficient documentation

## 2023-08-02 DIAGNOSIS — C7951 Secondary malignant neoplasm of bone: Secondary | ICD-10-CM | POA: Insufficient documentation

## 2023-08-02 DIAGNOSIS — Z79899 Other long term (current) drug therapy: Secondary | ICD-10-CM | POA: Insufficient documentation

## 2023-08-02 DIAGNOSIS — R262 Difficulty in walking, not elsewhere classified: Secondary | ICD-10-CM | POA: Diagnosis not present

## 2023-08-02 DIAGNOSIS — Z5112 Encounter for antineoplastic immunotherapy: Secondary | ICD-10-CM | POA: Insufficient documentation

## 2023-08-02 LAB — COMPREHENSIVE METABOLIC PANEL
ALT: 11 U/L (ref 0–44)
AST: 13 U/L — ABNORMAL LOW (ref 15–41)
Albumin: 2.5 g/dL — ABNORMAL LOW (ref 3.5–5.0)
Alkaline Phosphatase: 37 U/L — ABNORMAL LOW (ref 38–126)
Anion gap: 5 (ref 5–15)
BUN: 18 mg/dL (ref 8–23)
CO2: 16 mmol/L — ABNORMAL LOW (ref 22–32)
Calcium: 5.3 mg/dL — CL (ref 8.9–10.3)
Chloride: 123 mmol/L — ABNORMAL HIGH (ref 98–111)
Creatinine, Ser: 0.62 mg/dL (ref 0.44–1.00)
GFR, Estimated: >60 mL/min (ref >60)
Glucose, Bld: 85 mg/dL (ref 70–99)
Potassium: 2.5 mmol/L — CL (ref 3.5–5.1)
Sodium: 144 mmol/L (ref 135–145)
Total Bilirubin: 0.2 mg/dL (ref 0.0–1.2)
Total Protein: 4.2 g/dL — ABNORMAL LOW (ref 6.5–8.1)

## 2023-08-02 LAB — URINALYSIS, COMPLETE (UACMP) WITH MICROSCOPIC
Bilirubin Urine: NEGATIVE
Glucose, UA: NEGATIVE mg/dL
Ketones, ur: NEGATIVE mg/dL
Leukocytes,Ua: NEGATIVE
Nitrite: NEGATIVE
Protein, ur: 100 mg/dL — AB
Specific Gravity, Urine: 1.016 (ref 1.005–1.030)
pH: 6 (ref 5.0–8.0)

## 2023-08-02 LAB — CMP (CANCER CENTER ONLY)
ALT: 18 U/L (ref 0–44)
AST: 21 U/L (ref 15–41)
Albumin: 4 g/dL (ref 3.5–5.0)
Alkaline Phosphatase: 62 U/L (ref 38–126)
Anion gap: 7 (ref 5–15)
BUN: 26 mg/dL — ABNORMAL HIGH (ref 8–23)
CO2: 24 mmol/L (ref 22–32)
Calcium: 8.9 mg/dL (ref 8.9–10.3)
Chloride: 107 mmol/L (ref 98–111)
Creatinine: 1.13 mg/dL — ABNORMAL HIGH (ref 0.44–1.00)
GFR, Estimated: 47 mL/min — ABNORMAL LOW (ref >60)
Glucose, Bld: 134 mg/dL — ABNORMAL HIGH (ref 70–99)
Potassium: 4 mmol/L (ref 3.5–5.1)
Sodium: 138 mmol/L (ref 135–145)
Total Bilirubin: 0.3 mg/dL (ref 0.0–1.2)
Total Protein: 7 g/dL (ref 6.5–8.1)

## 2023-08-02 MED ORDER — HEPARIN SOD (PORK) LOCK FLUSH 100 UNIT/ML IV SOLN
500.0000 [IU] | Freq: Once | INTRAVENOUS | Status: AC
  Administered 2023-08-02: 500 [IU] via INTRAVENOUS

## 2023-08-02 MED ORDER — FENTANYL 12 MCG/HR TD PT72: MEDICATED_PATCH | TRANSDERMAL | 0 refills | Status: DC

## 2023-08-02 MED ORDER — FENTANYL 25 MCG/HR TD PT72: 1.0000 | MEDICATED_PATCH | TRANSDERMAL | 0 refills | Status: DC

## 2023-08-02 MED ORDER — SODIUM CHLORIDE 0.9% FLUSH
10.0000 mL | INTRAVENOUS | Status: DC | PRN
  Administered 2023-08-02: 10 mL via INTRAVENOUS

## 2023-08-02 NOTE — Progress Notes (Signed)
 CRITICAL VALUE STICKER  CRITICAL VALUE: K+ 2.5 and Ca+ 5.3  RECEIVER (on-site recipient of call): Karlee Staff,RN  DATE & TIME NOTIFIED: 08/02/2023 @ 1430  MESSENGER (representative from lab):James  MD NOTIFIED: Olam Ned, NP  TIME OF NOTIFICATION:1435  RESPONSE: Unusual for this patient. Will redraw sample

## 2023-08-02 NOTE — Patient Instructions (Signed)
 [employer], Adult A central line is a long, thin tube (catheter) that can be used to collect blood for testing or to give medicine through a vein. The tip of the central line ends in a large vein just above the heart (vena cava). A central line may be placed because: You need to get medicines or fluids through an IV for a long period of time. You need nutrition but cannot eat or absorb nutrients. The veins in your hands or arms are difficult to use for IV access. You need a blood transfusion. You need chemotherapy or dialysis. Types of central lines There are four main types of central lines: Peripherally inserted central catheter (PICC) line. This type is used for access of one week or longer. It can be used to draw blood and give fluids or medicines. A PICC looks like an IV tube, but it goes up the arm to the heart. It is usually inserted in the upper arm and taped in place on the arm. Tunneled central line. This type is used for long-term therapy and dialysis. It is placed in a large vein in the neck, chest, or groin. It is inserted through a small incision made over the vein, and then it is advanced to the heart. It is tunneled under the skin and brought out through a second incision. Non-tunneled central line. This type is used for short-term access, usually for a maximum of 7 days. It is often used in the emergency department. It is inserted in the neck, chest, or groin. Implanted port. This type is used for long-term therapy. It can stay in place longer than other types of central lines. It is normally inserted in the upper chest, but it can also be placed in the upper arm or the abdomen. It is inserted and removed with surgery, and it is accessed using a needle. The type of central line that you receive depends on how long you will need it, your medical condition, and the condition of your veins. Tell a health care provider about: Any allergies you have. All medicines you are taking,  including vitamins, herbs, eye drops, creams, and over-the-counter medicines. Any problems you or family members have had with anesthetic medicines. Any blood disorders you have. Any surgeries you have had. Any medical conditions you have. Whether you are pregnant or may be pregnant. What are the risks? Generally, placement and use of a central line is safe. However, problems may occur, including: Infection. A blood clot that blocks the central line or forms in the vein and travels to the heart. Bleeding from the place where the central line was inserted. Developing a hole or crack within the central line. If this happens, the central line will need to be replaced. Central line failure. The catheter moving or coming out of place. What happens before the procedure? Medicines Ask your health care provider about: Changing or stopping your regular medicines. This is especially important if you are taking diabetes medicines or blood thinners. Taking medicines such as aspirin and ibuprofen. These medicines can thin your blood. Do not take these medicines unless your health care provider tells you to take them. Taking over-the-counter medicines, vitamins, herbs, and supplements. General instructions Follow instructions from your health care provider about eating or drinking restrictions. Ask your health care provider: How your procedure site will be marked. What steps will be taken to help prevent infection. These steps may include: Removing hair at the procedure site. Washing skin with a germ-killing soap.  Plan to have a responsible adult take you home from the hospital or clinic. If you will be going home right after the procedure, plan to have a responsible adult care for you for the time you are told. This is important. What happens during the procedure? The procedure will vary depending on the type of central line being placed. In general: An IV will be inserted into one of your  veins. You will be given one or more of the following: A medicine to help you relax (sedative). A medicine to numb the area (local anesthetic). Your skin will be cleaned with a germ-killing (antiseptic) solution, and you may be covered with sterile drapes. Your blood pressure, heart rate, breathing rate, and blood oxygen level will be monitored during the procedure. The central line catheter will be inserted into the vein and advanced to the correct spot. The health care provider may use X-ray equipment to help guide the catheter to the right place. A bandage (dressing) will be placed over the insertion area. The procedure may vary among health care providers and hospitals. What can I expect after the procedure? Your blood pressure, heart rate, breathing rate, and blood oxygen level will be monitored until you leave the hospital or clinic. Antiseptic caps may be placed on the ends of the central line tubing. If you were given a sedative during the procedure, it can affect you for several hours. Do not drive or operate machinery until your health care provider says that it is safe. Follow these instructions at home: Flushing and cleaning the central line  Follow instructions from your health care provider about flushing and cleaning the central line and the area around it. Only use sterile supplies to flush the central line. Use supplies from your health care provider, a pharmacy, or another source that is recommended by your health care provider. Before you flush the central line or clean the central line or the area around it: Wash your hands with soap and water for at least 20 seconds. If soap and water are not available, use alcohol-based hand sanitizer. Clean the central line hub with rubbing alcohol. Unless directed otherwise by the manufacturer's instructions, scrub using a twisting motion and rub for 10 to 15 seconds or for 30 twists. Be sure you scrub the top of the hub, not just the  sides. Never reuse alcohol pads. Let the hub dry before use. Prevent it from touching anything while drying. Caring for the incision or central line site Check your incision or central line site every day for signs of infection. Check for: Redness, swelling, or pain. Fluid or blood. Warmth. Pus or a bad smell. Keep the insertion site of your central line clean and dry at all times. Change your dressing only as told by your health care provider. Keep your dressing dry. If it gets wet, have it changed as soon as possible. General instructions Follow instructions from your health care provider for the type of device that you have. Keep the tube clamped, unless it is being used. If the central line accidentally gets pulled on, make sure: The dressing is okay. There is no bleeding. The line has not been pulled out. Do not use scissors or sharp objects near the tube. Do not take baths, swim, or use a hot tub until your health care provider approves. Ask your health care provider if you may take showers. You may only be allowed to take sponge baths. Ask your health care provider what activities are  safe for you. You may be restricted from lifting or making repetitive arm movements on the side of your central line. Take over-the-counter and prescription medicines only as told by your health care provider. Keep all follow-up visits. This is important. Storage and disposal of supplies Keep your supplies in a clean, dry location. Throw away any used syringes in a disposal container that is meant for sharp items (sharps container). You can buy a sharps container from a pharmacy, or you can make one by using an empty hard plastic bottle with a cover. Place any used dressings or infusion bags into a plastic bag. Throw that bag in the trash. Contact a health care provider if: You have redness, swelling, or pain around your insertion site. You have fluid or blood coming from your insertion site. Your  insertion site feels warm to the touch. You have pus or a bad smell coming from your insertion site. Get help right away if: You have: A fever or chills. Shortness of breath. Chest pain or a racing heartbeat. Swelling in your neck, face, chest, or arm on the side of your central line. You feel dizzy or you faint. Your incision or central line site has red streaks spreading away from the area. Your incision or central line site is bleeding and does not stop. Your central line is difficult to flush or will not flush. You do not get a blood return from the central line. Your central line gets loose or damaged or comes out. Your catheter leaks when flushed or when fluids are infused into it. Summary A central line is a long, thin tube (catheter) that can be used to give medicine through a vein. Follow specific instructions from your health care provider for the type of device that you have. Keep the insertion site of your central line clean and dry at all times. Keep the tube clamped, unless it is being used. This information is not intended to replace advice given to you by your health care provider. Make sure you discuss any questions you have with your health care provider. Document Revised: 03/17/2020 Document Reviewed: 03/18/2020 Elsevier Patient Education  2024 [employer].

## 2023-08-02 NOTE — Progress Notes (Signed)
 [facility] OFFICE PROGRESS NOTE   Diagnosis: Non-small cell lung cancer  INTERVAL HISTORY:   [name] returns prior to scheduled follow-up.  She completed a cycle of Pembrolizumab  07/19/2023.  At her last visit Lasix  was discontinued due to a mild elevation of creatinine.  She contacted the office earlier this week requesting evaluation of leg edema and pain.  She reports persistent bilateral leg swelling.  She is unable to significantly elevate her legs due to left hip pain.  She has not tried support stockings.  She feels the swelling is present throughout both legs.  Legs are tender to touch.  She thinks her abdomen may be swollen also.  No arm edema.  Urine in general has a normal appearance, not foamy or discolored.  No hematuria.  She feels she is urinating an adequate amount.  No shortness of breath.  She is having more left hip pain.  She is currently on a Duragesic  25 mcg patch every 3 days and takes oxycodone  every 6 hours.  Objective:  Vital signs in last 24 hours:  Blood pressure (!) 137/59, pulse (!) 58, temperature 98.1 F (36.7 C), temperature source Temporal, resp. rate 18, height 5' 5 (1.651 m), weight 171 lb 9.6 oz (77.8 kg), SpO2 98%.   Exam completed in the wheelchair as she is unable to maneuver onto the exam table. HEENT: Mucous membranes are moist. Resp: Lungs clear bilaterally. Cardio: Regular rate and rhythm. GI: Abdomen soft and nontender.  No hepatomegaly.  No apparent ascites. Vascular: Trace to 1+ edema below the knees bilaterally.  Feet are not significantly edematous. Port-A-Cath without erythema.  Lab Results:  Lab Results  Component Value Date   WBC 4.8 07/19/2023   HGB 9.8 (L) 07/19/2023   HCT 31.0 (L) 07/19/2023   MCV 90.4 07/19/2023   PLT 147 (L) 07/19/2023   NEUTROABS 3.4 07/19/2023    Imaging:  No results found.  Medications: I have reviewed the patient's current medications.  Assessment/Plan: Non-small cell  lung cancer MRI lumbar spine 04/29/2019- enlarging marrow lesions involving the L1 vertebral body, upper left sacrum and right iliac bone MRI pelvis 04/29/2019- 3.5 cm left iliac bone lesion appears slightly larger; other similar appearing lesions present within the left superior pubic ramus, left superior abdomen acetabulum and upper left sacrum Kappa free light chains with mild elevation 05/12/2019  CTs 05/12/2019- left lower lobe pulmonary mass 3.3 x 3.2 cm; lytic process left iliac bone; spinal lesions; 1.1 cm low-density left kidney lesion; right thyroid  enlargement with heterogeneous appearance with potential for multiple discrete lesions Biopsy left lower lobe lung mass 05/26/2019-poorly differentiated carcinoma; positive for cytokeratin 5/6, p63 and TTF-1, no EGFR, BRAF, ALK, ERBB2,ROS, or NTRK alteration Cycle 1 carboplatin /Alimta /pembrolizumab  06/06/2019 Cycle 2 carboplatin /Alimta /pembrolizumab  06/27/2019 Cycle 3 carboplatin /Alimta /pembrolizumab  07/18/2019 Cycle 4 carboplatin /Alimta /pembrolizumab  08/07/2019 CTs 08/27/2019-significant decrease in size of lobulated mass left lower lobe.  Unchanged appearance of subtle bone lesions. Cycle 5 Alimta /pembrolizumab  08/28/2019 Cycle 6 Alimta /pembrolizumab  09/18/2019 Cycle 7 Alimta /pembrolizumab  10/09/2019 Cycle 8 Alimta /pembrolizumab  10/30/2019 Cycle 9 Alimta /pembrolizumab  11/20/2019 CTs 12/09/2019-no evidence of disease progression, left lower lobe nodule slightly decreased in size, stable L1, left sacral, and left pubic ramus metastases Cycle 10 Alimta /pembrolizumab  12/11/2019 Cycle 11 pembrolizumab  alone 01/02/2020 (Alimta  held due to edema, tenderness, erythema at the lower legs) Cycle 12 pembrolizumab  01/23/2020 Cycle 13 pembrolizumab  02/12/2020 Cycle 14 pembrolizumab  03/04/2020 CTs 03/18/2020-stable left lower lobe lesion, mild sclerosis at the superior endplate of L1 that was previously hypermetabolic, stable small left upper sacral lucent lesion,  previous  left superior pubic ramus lesion is occult on the CT, CT head negative for malignancy Cycle 15 pembrolizumab  03/25/2020 Cycle 16 pembrolizumab  04/21/2020 Cycle 17 pembrolizumab  05/17/2020 05/19/2020 bone scan-no definite abnormalities to suggest osseous metastases.  Areas of concern on prior PET-CT involving left iliac bone and L1 vertebral body showed no abnormalities on the current study Cycle 18 Pembrolizumab  06/10/2020 Cycle 19 Pembrolizumab  06/30/2020 CTs 07/16/2020-stable left lower lobe nodule, stable faint superior L1 vertebral lesion, no evidence of disease progression Cycle 20 pembrolizumab  07/21/2020 Cycle 21 Pembrolizumab  08/11/2020 Cycle 22 pembrolizumab  09/01/2020 Cycle 23 Pembrolizumab  09/22/2020 Cycle 24 pembrolizumab  10/13/2020 Cycle 25 Pembrolizumab  11/03/2020 Cycle 26 pembrolizumab  11/26/2020 CTs 12/15/2020- stable left lower lobe mass, stable sclerotic lesion at L2, no evidence of disease progression Cycle 27 pembrolizumab  12/17/2020 PET scan 01/03/2021-1.8 x 1.3 cm left lower lobe nodule similar in size to CT of 12/15/2020 and measures smaller than previous PET/CT from 2020.  Nodule is markedly hypermetabolic.  No evidence for hypermetabolic hilar or mediastinal lymphadenopathy.  Several tiny foci of hypermetabolism identified in bony anatomy raising concern for skeletal metastases.  Comparison of the PET to CT 07/16/2020-lobular left lower lobe pulmonary nodule measured 2.4 x 1.5 cm on the prior study, current study it measured 1.8 x 1.3 cm.  Lesion in the anterior left acetabulum is similar to the 07/16/2020 exam although overlying cortical thinning slightly more pronounced on the current study.  Described lesion in the scapula shows some cortical sclerosis and a tiny central marrow lucency not substantially changed compared to 07/16/2020.  Left third rib lesion shows heterogeneous mineralization similar to 07/16/2020. MRI of cervical spine 01/13/2021-increased left facet edema at C5-C6,  severe facet arthrosis on the left at C7-T1 and on the right at C3-C4, no evidence of metastatic disease Cycle 28 Pembrolizumab  01/14/2021 Cycle 29 Pembrolizumab  02/03/2021 Cycle 30 Pembrolizumab  02/24/2021 MRI left hip 03/02/2021-compared to 04/29/2019, slight increase in size of metastases at the left iliac and left acetabulum.  Lesion at the left upper sacrum is less conspicuous and a lesion at the left superior pubic ramus is stable Cycle 31 pembrolizumab  03/17/2021 Radiation to left acetabulum and left iliac 03/30/2021-04/13/2021 Cycle 32 pembrolizumab  04/07/2021 Cycle 33 Pembrolizumab  04/28/2021 Cycle 34 pembrolizumab  05/18/2021 PET scan 06/01/2021-no significant change in size or degree of FDG uptake associated with FDG avid left lower lobe lung nodule compatible with neoplasm.  Stable to improved appearance of multifocal FDG avid bone metastasis. Cycle 35 pembrolizumab  06/10/2021 Cycle 36 pembrolizumab  07/01/2021 Cycle 37 pembrolizumab  07/29/2021 Cycle 38 pembrolizumab  08/19/2021 Cycle 39 Pembrolizumab  09/09/2021 Cycle 40 pembrolizumab  09/30/2021 CT chest 10/20/2021 to evaluate complaints of chest pain-no PE.  Interval increase in left lower lobe mass. Cycle 41 Pembrolizumab  10/21/2021 Cycle 42 pembrolizumab  11/11/2021 PET 11/24/2021-mild increase in size of left lower lobe mass, no evidence of solid organ or nodal metastases, stable multifocal bone metastases.  No new sites of metastatic disease. Cycle 43 pembrolizumab  11/29/2021 Cycle 45 Pembrolizumab  12/20/2021 Cycle 46 Pembrolizumab  01/09/2022 Cycle 47 pembrolizumab  02/01/2022 Cycle 48 Pembrolizumab  02/24/2022 Cycle 49 Pembrolizumab  03/17/2022 Cycle 50 pembrolizumab  04/07/2022 MRI l pelvis 04/12/2022-new lesion at the left superior acetabulum, stable bone lesions at the left suprapubic ramus, left S1, and left iliac Cycle 51 pembrolizumab  04/28/2022 Palliative radiation to the left hip and left scapula 04/26/2022-05/09/2022 Cycle 52 pembrolizumab   05/18/2022 PET 06/02/2022-stable left lower lobe tumor, slight increase in hypermetabolism associated with the left acetabular metastasis, additional previously seen bone lesions no longer have hypermetabolism Cycle 53 pembrolizumab  06/08/2022 Cycle 54 pembrolizumab  06/29/2022 Cycle 55  pembrolizumab  07/20/2022 Cycle 56 pembrolizumab  08/10/2022 Cycle 57 pembrolizumab  08/31/2022 Cycle 58 pembrolizumab  09/21/2022 Cycle 59 pembrolizumab  10/12/2022 Cycle 60 Pembrolizumab  11/02/2022 Cycle 61 pembrolizumab  11/23/2022 Cycle 62 Pembrolizumab  12/21/2022 PET scan 12/29/2022-mild increase in size and activity of the left lower lobe mass Cycle 63 Pembrolizumab  01/11/2023 Cycle 64 Pembrolizumab  01/31/2023 Cycle 65 pembrolizumab  02/21/2023 Cycle 66 Pembrolizumab  03/14/2023 CT pelvis 03/31/2023-mild progressive displacement of the left acetabular fracture, no fracture healing, no new fractures, no new lytic or blastic lesions Cycle 67 Pembrolizumab  04/27/2023 Cycle 68 Pembrolizumab  05/18/2023 Cycle 69 pembrolizumab  06/08/2023 Cycle 70 Pembrolizumab  06/27/2023 Cycle 71 Pembrolizumab  07/19/2023 Pain secondary to metastatic lung cancer Chronic back pain Type 2 diabetes Essential hypertension CAD Hyperlipidemia Family history significant for multiple members with breast cancer Grade 1 skin rash 07/18/2019 likely related to immunotherapy.  Topical steroid cream as needed. E. coli urinary tract infection 07/14/2019.  Completed cephalexin . Edema/tenderness at the right greater than left ankle 10/21/2019-etiology unclear, potentially related to systemic therapy or an infection, doxycycline  prescribed-improved 10/23/2019; marked improvement 11/20/2019; at office visit 01/02/2020 she reports worsening of lower extremity edema, pain/tenderness, erythema 3 to 4 days following each treatment.  Alimta  held 01/02/2020.  Referral to dermatology. COVID-19 infection 03/30/2020, monoclonal antibody therapy 04/06/2020 Arthritis, possibly related  to Pembrolizumab , improved since beginning Medrol , now followed by Dr. Mai Pain left femoral head/upper femur/groin-negative plain x-ray; CT pelvis 03/28/2022-no acute appearing bone abnormality at the left hip or involving the proximal left femur, no evidence of acute fracture or dislocation, soft tissues left hip and left groin unremarkable; osseous mets within the left sacrum, left iliac bone and anterior acetabulum without evidence of associated fracture or dislocation. Percutaneous fixation of left pathologic acetabular fracture 08/23/2022 CT pelvis and left hip 11/02/2022-subacute nondisplaced fracture through the supra-acetabular left ilium extending through the acetabular roof, placement of 2 screws within the left ilium extending to the supra-acetabular region with an additional screw traversing the left superior pubic ramus, subtle sclerosis in the left ilium and superior left acetabulum, no new site of metastatic disease. CT pelvis 03/31/2023-mild progressive displacement of the left acetabular fracture, no fracture healing   15.  Admission 03/31/2023 after a fall, difficulty with ambulation secondary to generalized weakness and left hip pain 16. acute renal failure and mild rhabdomyolysis on hospital admission 03/31/2023 17.  Anemia secondary to chronic disease and bone marrow involvement with tumor  Disposition: Ms. Demont appears stable.  She has developed increased leg swelling since discontinuing Lasix  about 2 weeks ago.  Lasix  was discontinued due to a mild elevation of creatinine.  Initial chemistry panel today returned with multiple abnormalities.  We were concerned results were not accurate.  Repeat chemistry panel was much closer to her baseline.  Creatinine was better.  She is very bothered by the leg swelling.  She will elevate legs as possible and wear support stockings.  She will resume Lasix  20 mg every other day.  She has poorly controlled left hip pain.  We decided to increase  the Duragesic  patch from 25 mcg every 3 days to 37 mcg every 3 days.  She will continue oxycodone  as needed.  She will return for follow-up as scheduled next week.  Patient seen with Dr. Cloretta.    Olam Ned ANP/GNP-BC   08/02/2023  2:44 PM  This was a shared visit with Olam Ned.  Ms. Bally was interviewed and examined.  She presents today with symptomatic leg edema.  The edema appears to be mild.  The etiology is likely multifactorial including immobility,  history of pelvic radiation, steroid use, narcotics, and renal insufficiency.  She will try elevating the legs, support stockings, and low-dose furosemide .  We adjusted the narcotic pain regimen.  I was present for greater than 50% of today's visit.  I performed medical decision making.  Arvella Hof, MD

## 2023-08-04 ENCOUNTER — Other Ambulatory Visit: Payer: Self-pay | Admitting: Oncology

## 2023-08-06 ENCOUNTER — Telehealth: Payer: Self-pay

## 2023-08-06 DIAGNOSIS — C7951 Secondary malignant neoplasm of bone: Secondary | ICD-10-CM | POA: Diagnosis not present

## 2023-08-06 DIAGNOSIS — R2681 Unsteadiness on feet: Secondary | ICD-10-CM | POA: Diagnosis not present

## 2023-08-06 DIAGNOSIS — M6281 Muscle weakness (generalized): Secondary | ICD-10-CM | POA: Diagnosis not present

## 2023-08-06 DIAGNOSIS — R262 Difficulty in walking, not elsewhere classified: Secondary | ICD-10-CM | POA: Diagnosis not present

## 2023-08-06 DIAGNOSIS — N179 Acute kidney failure, unspecified: Secondary | ICD-10-CM | POA: Diagnosis not present

## 2023-08-06 NOTE — Telephone Encounter (Signed)
 The patient's daughter contacted us  to inform that the nurse at [facility] had not received the updated order for changing the Duragesic  patch to 37 mcg every three days. She requested that we fax the new order. I inquired whether the nurse had also received the Lasix  order since it was noted in the documentation. I followed up with the nurse at [facility], who confirmed that they had indeed received the order.

## 2023-08-08 DIAGNOSIS — C7951 Secondary malignant neoplasm of bone: Secondary | ICD-10-CM | POA: Diagnosis not present

## 2023-08-08 DIAGNOSIS — N179 Acute kidney failure, unspecified: Secondary | ICD-10-CM | POA: Diagnosis not present

## 2023-08-08 DIAGNOSIS — M6281 Muscle weakness (generalized): Secondary | ICD-10-CM | POA: Diagnosis not present

## 2023-08-09 ENCOUNTER — Inpatient Hospital Stay: Payer: HMO

## 2023-08-09 ENCOUNTER — Inpatient Hospital Stay (HOSPITAL_BASED_OUTPATIENT_CLINIC_OR_DEPARTMENT_OTHER): Payer: HMO | Admitting: Oncology

## 2023-08-09 VITALS — BP 116/58 | HR 60 | Temp 98.1°F | Resp 18 | Ht 65.0 in | Wt 170.0 lb

## 2023-08-09 DIAGNOSIS — C3492 Malignant neoplasm of unspecified part of left bronchus or lung: Secondary | ICD-10-CM

## 2023-08-09 DIAGNOSIS — R2681 Unsteadiness on feet: Secondary | ICD-10-CM | POA: Diagnosis not present

## 2023-08-09 DIAGNOSIS — Z5112 Encounter for antineoplastic immunotherapy: Secondary | ICD-10-CM | POA: Diagnosis not present

## 2023-08-09 DIAGNOSIS — N179 Acute kidney failure, unspecified: Secondary | ICD-10-CM | POA: Diagnosis not present

## 2023-08-09 DIAGNOSIS — C7951 Secondary malignant neoplasm of bone: Secondary | ICD-10-CM | POA: Diagnosis not present

## 2023-08-09 DIAGNOSIS — M6281 Muscle weakness (generalized): Secondary | ICD-10-CM | POA: Diagnosis not present

## 2023-08-09 DIAGNOSIS — R262 Difficulty in walking, not elsewhere classified: Secondary | ICD-10-CM | POA: Diagnosis not present

## 2023-08-09 LAB — CMP (CANCER CENTER ONLY)
ALT: 17 U/L (ref 0–44)
AST: 23 U/L (ref 15–41)
Albumin: 4.2 g/dL (ref 3.5–5.0)
Alkaline Phosphatase: 61 U/L (ref 38–126)
Anion gap: 8 (ref 5–15)
BUN: 22 mg/dL (ref 8–23)
CO2: 25 mmol/L (ref 22–32)
Calcium: 9.2 mg/dL (ref 8.9–10.3)
Chloride: 104 mmol/L (ref 98–111)
Creatinine: 1.23 mg/dL — ABNORMAL HIGH (ref 0.44–1.00)
GFR, Estimated: 43 mL/min — ABNORMAL LOW (ref >60)
Glucose, Bld: 165 mg/dL — ABNORMAL HIGH (ref 70–99)
Potassium: 3.5 mmol/L (ref 3.5–5.1)
Sodium: 137 mmol/L (ref 135–145)
Total Bilirubin: 0.3 mg/dL (ref 0.0–1.2)
Total Protein: 7 g/dL (ref 6.5–8.1)

## 2023-08-09 LAB — CBC WITH DIFFERENTIAL (CANCER CENTER ONLY)
Abs Immature Granulocytes: 0.01 10*3/uL (ref 0.00–0.07)
Basophils Absolute: 0 10*3/uL (ref 0.0–0.1)
Basophils Relative: 0 %
Eosinophils Absolute: 0.1 10*3/uL (ref 0.0–0.5)
Eosinophils Relative: 2 %
HCT: 31.9 % — ABNORMAL LOW (ref 36.0–46.0)
Hemoglobin: 10 g/dL — ABNORMAL LOW (ref 12.0–15.0)
Immature Granulocytes: 0 %
Lymphocytes Relative: 15 %
Lymphs Abs: 0.7 10*3/uL (ref 0.7–4.0)
MCH: 28 pg (ref 26.0–34.0)
MCHC: 31.3 g/dL (ref 30.0–36.0)
MCV: 89.4 fL (ref 80.0–100.0)
Monocytes Absolute: 0.4 10*3/uL (ref 0.1–1.0)
Monocytes Relative: 9 %
Neutro Abs: 3.3 10*3/uL (ref 1.7–7.7)
Neutrophils Relative %: 74 %
Platelet Count: 150 10*3/uL (ref 150–400)
RBC: 3.57 MIL/uL — ABNORMAL LOW (ref 3.87–5.11)
RDW: 15.5 % (ref 11.5–15.5)
WBC Count: 4.5 10*3/uL (ref 4.0–10.5)
nRBC: 0 % (ref 0.0–0.2)

## 2023-08-09 MED ORDER — HEPARIN SOD (PORK) LOCK FLUSH 100 UNIT/ML IV SOLN
500.0000 [IU] | Freq: Once | INTRAVENOUS | Status: AC | PRN
  Administered 2023-08-09: 500 [IU]

## 2023-08-09 MED ORDER — SODIUM CHLORIDE 0.9% FLUSH
10.0000 mL | INTRAVENOUS | Status: DC | PRN
  Administered 2023-08-09: 10 mL

## 2023-08-09 MED ORDER — PEMBROLIZUMAB CHEMO INJECTION 100 MG/4ML
200.0000 mg | Freq: Once | INTRAVENOUS | Status: AC
  Administered 2023-08-09: 200 mg via INTRAVENOUS
  Filled 2023-08-09: qty 8

## 2023-08-09 MED ORDER — SODIUM CHLORIDE 0.9 % IV SOLN: Freq: Once | INTRAVENOUS | Status: AC

## 2023-08-09 NOTE — Patient Instructions (Signed)
 [facility]  Discharge Instructions: Thank you for choosing [facility] to provide your oncology and hematology care.   If you have a lab appointment with the Cancer Center, please go directly to the Cancer Center and check in at the registration area.   Wear comfortable clothing and clothing appropriate for easy access to any Portacath or PICC line.   We strive to give you quality time with your provider. You may need to reschedule your appointment if you arrive late (15 or more minutes).  Arriving late affects you and other patients whose appointments are after yours.  Also, if you miss three or more appointments without notifying the office, you may be dismissed from the clinic at the provider's discretion.      For prescription refill requests, have your pharmacy contact our office and allow 72 hours for refills to be completed.    Today you received the following chemotherapy and/or immunotherapy agents Keytruda      To help prevent nausea and vomiting after your treatment, we encourage you to take your nausea medication as directed.  BELOW ARE SYMPTOMS THAT SHOULD BE REPORTED IMMEDIATELY: *FEVER GREATER THAN 100.4 F (38 C) OR HIGHER *CHILLS OR SWEATING *NAUSEA AND VOMITING THAT IS NOT CONTROLLED WITH YOUR NAUSEA MEDICATION *UNUSUAL SHORTNESS OF BREATH *UNUSUAL BRUISING OR BLEEDING *URINARY PROBLEMS (pain or burning when urinating, or frequent urination) *BOWEL PROBLEMS (unusual diarrhea, constipation, pain near the anus) TENDERNESS IN MOUTH AND THROAT WITH OR WITHOUT PRESENCE OF ULCERS (sore throat, sores in mouth, or a toothache) UNUSUAL RASH, SWELLING OR PAIN  UNUSUAL VAGINAL DISCHARGE OR ITCHING   Items with * indicate a potential emergency and should be followed up as soon as possible or go to the Emergency Department if any problems should occur.  Please show the CHEMOTHERAPY ALERT CARD or IMMUNOTHERAPY  ALERT CARD at check-in to the Emergency Department and triage nurse.  Should you have questions after your visit or need to cancel or reschedule your appointment, please contact [facility]  Dept: [phone]  and follow the prompts.  Office hours are 8:00 a.m. to 4:30 p.m. Monday - Friday. Please note that voicemails left after 4:00 p.m. may not be returned until the following business day.  We are closed weekends and major holidays. You have access to a nurse at all times for urgent questions. Please call the main number to the clinic Dept: [phone] and follow the prompts.   For any non-urgent questions, you may also contact your provider using MyChart. We now offer e-Visits for anyone 6 and older to request care online for non-urgent symptoms. For details visit [email or web address].   Also download the MyChart app! Go to the app store, search "MyChart", open the app, select [place], and log in with your MyChart username and password.

## 2023-08-09 NOTE — Progress Notes (Signed)
 Patient seen by Dr. Arley Hof today  Vitals are within treatment parameters:Yes   Labs are within treatment parameters: Yes   Treatment plan has been signed: Yes   Per physician team, Patient is ready for treatment and there are NO modifications to the treatment plan.

## 2023-08-09 NOTE — Progress Notes (Signed)
 [facility] OFFICE PROGRESS NOTE   Diagnosis: Non-small cell lung cancer  INTERVAL HISTORY:   Ms. Cales returns as scheduled.  She continues to have pain at the left hip.  She increased the Duragesic  to 37 mcg earlier this week.  She continues oxycodone  2-3 times per day for breakthrough pain.  She has difficulty positioning in bed due to pain.  Leg swelling has improved.  Objective:  Vital signs in last 24 hours:  Blood pressure (!) 116/58, pulse 60, temperature 98.1 F (36.7 C), temperature source Temporal, resp. rate 18, height 5' 5 (1.651 m), weight 170 lb (77.1 kg), SpO2 96%.   Resp: Lungs clear bilaterally Cardio: Regular rate and rhythm GI: No hepatosplenomegaly Vascular: No leg edema    Portacath/PICC-without erythema  Lab Results:  Lab Results  Component Value Date   WBC 4.5 08/09/2023   HGB 10.0 (L) 08/09/2023   HCT 31.9 (L) 08/09/2023   MCV 89.4 08/09/2023   PLT 150 08/09/2023   NEUTROABS 3.3 08/09/2023    CMP  Lab Results  Component Value Date   NA 137 08/09/2023   K 3.5 08/09/2023   CL 104 08/09/2023   CO2 25 08/09/2023   GLUCOSE 165 (H) 08/09/2023   BUN 22 08/09/2023   CREATININE 1.23 (H) 08/09/2023   CALCIUM  9.2 08/09/2023   PROT 7.0 08/09/2023   ALBUMIN 4.2 08/09/2023   AST 23 08/09/2023   ALT 17 08/09/2023   ALKPHOS 61 08/09/2023   BILITOT 0.3 08/09/2023   GFRNONAA 43 (L) 08/09/2023   GFRAA >60 04/21/2020     Medications: I have reviewed the patient's current medications.   Assessment/Plan: Non-small cell lung cancer MRI lumbar spine 04/29/2019- enlarging marrow lesions involving the L1 vertebral body, upper left sacrum and right iliac bone MRI pelvis 04/29/2019- 3.5 cm left iliac bone lesion appears slightly larger; other similar appearing lesions present within the left superior pubic ramus, left superior abdomen acetabulum and upper left sacrum Kappa free light chains with mild elevation 05/12/2019  CTs  05/12/2019- left lower lobe pulmonary mass 3.3 x 3.2 cm; lytic process left iliac bone; spinal lesions; 1.1 cm low-density left kidney lesion; right thyroid  enlargement with heterogeneous appearance with potential for multiple discrete lesions Biopsy left lower lobe lung mass 05/26/2019-poorly differentiated carcinoma; positive for cytokeratin 5/6, p63 and TTF-1, no EGFR, BRAF, ALK, ERBB2,ROS, or NTRK alteration Cycle 1 carboplatin /Alimta /pembrolizumab  06/06/2019 Cycle 2 carboplatin /Alimta /pembrolizumab  06/27/2019 Cycle 3 carboplatin /Alimta /pembrolizumab  07/18/2019 Cycle 4 carboplatin /Alimta /pembrolizumab  08/07/2019 CTs 08/27/2019-significant decrease in size of lobulated mass left lower lobe.  Unchanged appearance of subtle bone lesions. Cycle 5 Alimta /pembrolizumab  08/28/2019 Cycle 6 Alimta /pembrolizumab  09/18/2019 Cycle 7 Alimta /pembrolizumab  10/09/2019 Cycle 8 Alimta /pembrolizumab  10/30/2019 Cycle 9 Alimta /pembrolizumab  11/20/2019 CTs 12/09/2019-no evidence of disease progression, left lower lobe nodule slightly decreased in size, stable L1, left sacral, and left pubic ramus metastases Cycle 10 Alimta /pembrolizumab  12/11/2019 Cycle 11 pembrolizumab  alone 01/02/2020 (Alimta  held due to edema, tenderness, erythema at the lower legs) Cycle 12 pembrolizumab  01/23/2020 Cycle 13 pembrolizumab  02/12/2020 Cycle 14 pembrolizumab  03/04/2020 CTs 03/18/2020-stable left lower lobe lesion, mild sclerosis at the superior endplate of L1 that was previously hypermetabolic, stable small left upper sacral lucent lesion, previous left superior pubic ramus lesion is occult on the CT, CT head negative for malignancy Cycle 15 pembrolizumab  03/25/2020 Cycle 16 pembrolizumab  04/21/2020 Cycle 17 pembrolizumab  05/17/2020 05/19/2020 bone scan-no definite abnormalities to suggest osseous metastases.  Areas of concern on prior PET-CT involving left iliac bone and L1 vertebral body showed no abnormalities on the current  study Cycle 18  Pembrolizumab  06/10/2020 Cycle 19 Pembrolizumab  06/30/2020 CTs 07/16/2020-stable left lower lobe nodule, stable faint superior L1 vertebral lesion, no evidence of disease progression Cycle 20 pembrolizumab  07/21/2020 Cycle 21 Pembrolizumab  08/11/2020 Cycle 22 pembrolizumab  09/01/2020 Cycle 23 Pembrolizumab  09/22/2020 Cycle 24 pembrolizumab  10/13/2020 Cycle 25 Pembrolizumab  11/03/2020 Cycle 26 pembrolizumab  11/26/2020 CTs 12/15/2020- stable left lower lobe mass, stable sclerotic lesion at L2, no evidence of disease progression Cycle 27 pembrolizumab  12/17/2020 PET scan 01/03/2021-1.8 x 1.3 cm left lower lobe nodule similar in size to CT of 12/15/2020 and measures smaller than previous PET/CT from 2020.  Nodule is markedly hypermetabolic.  No evidence for hypermetabolic hilar or mediastinal lymphadenopathy.  Several tiny foci of hypermetabolism identified in bony anatomy raising concern for skeletal metastases.  Comparison of the PET to CT 07/16/2020-lobular left lower lobe pulmonary nodule measured 2.4 x 1.5 cm on the prior study, current study it measured 1.8 x 1.3 cm.  Lesion in the anterior left acetabulum is similar to the 07/16/2020 exam although overlying cortical thinning slightly more pronounced on the current study.  Described lesion in the scapula shows some cortical sclerosis and a tiny central marrow lucency not substantially changed compared to 07/16/2020.  Left third rib lesion shows heterogeneous mineralization similar to 07/16/2020. MRI of cervical spine 01/13/2021-increased left facet edema at C5-C6, severe facet arthrosis on the left at C7-T1 and on the right at C3-C4, no evidence of metastatic disease Cycle 28 Pembrolizumab  01/14/2021 Cycle 29 Pembrolizumab  02/03/2021 Cycle 30 Pembrolizumab  02/24/2021 MRI left hip 03/02/2021-compared to 04/29/2019, slight increase in size of metastases at the left iliac and left acetabulum.  Lesion at the left upper sacrum is less conspicuous and a lesion at the left  superior pubic ramus is stable Cycle 31 pembrolizumab  03/17/2021 Radiation to left acetabulum and left iliac 03/30/2021-04/13/2021 Cycle 32 pembrolizumab  04/07/2021 Cycle 33 Pembrolizumab  04/28/2021 Cycle 34 pembrolizumab  05/18/2021 PET scan 06/01/2021-no significant change in size or degree of FDG uptake associated with FDG avid left lower lobe lung nodule compatible with neoplasm.  Stable to improved appearance of multifocal FDG avid bone metastasis. Cycle 35 pembrolizumab  06/10/2021 Cycle 36 pembrolizumab  07/01/2021 Cycle 37 pembrolizumab  07/29/2021 Cycle 38 pembrolizumab  08/19/2021 Cycle 39 Pembrolizumab  09/09/2021 Cycle 40 pembrolizumab  09/30/2021 CT chest 10/20/2021 to evaluate complaints of chest pain-no PE.  Interval increase in left lower lobe mass. Cycle 41 Pembrolizumab  10/21/2021 Cycle 42 pembrolizumab  11/11/2021 PET 11/24/2021-mild increase in size of left lower lobe mass, no evidence of solid organ or nodal metastases, stable multifocal bone metastases.  No new sites of metastatic disease. Cycle 43 pembrolizumab  11/29/2021 Cycle 45 Pembrolizumab  12/20/2021 Cycle 46 Pembrolizumab  01/09/2022 Cycle 47 pembrolizumab  02/01/2022 Cycle 48 Pembrolizumab  02/24/2022 Cycle 49 Pembrolizumab  03/17/2022 Cycle 50 pembrolizumab  04/07/2022 MRI l pelvis 04/12/2022-new lesion at the left superior acetabulum, stable bone lesions at the left suprapubic ramus, left S1, and left iliac Cycle 51 pembrolizumab  04/28/2022 Palliative radiation to the left hip and left scapula 04/26/2022-05/09/2022 Cycle 52 pembrolizumab  05/18/2022 PET 06/02/2022-stable left lower lobe tumor, slight increase in hypermetabolism associated with the left acetabular metastasis, additional previously seen bone lesions no longer have hypermetabolism Cycle 53 pembrolizumab  06/08/2022 Cycle 54 pembrolizumab  06/29/2022 Cycle 55 pembrolizumab  07/20/2022 Cycle 56 pembrolizumab  08/10/2022 Cycle 57 pembrolizumab  08/31/2022 Cycle 58 pembrolizumab   09/21/2022 Cycle 59 pembrolizumab  10/12/2022 Cycle 60 Pembrolizumab  11/02/2022 Cycle 61 pembrolizumab  11/23/2022 Cycle 62 Pembrolizumab  12/21/2022 PET scan 12/29/2022-mild increase in size and activity of the left lower lobe mass Cycle 63 Pembrolizumab  01/11/2023 Cycle 64 Pembrolizumab  01/31/2023 Cycle 65 pembrolizumab  02/21/2023 Cycle 66  Pembrolizumab  03/14/2023 CT pelvis 03/31/2023-mild progressive displacement of the left acetabular fracture, no fracture healing, no new fractures, no new lytic or blastic lesions Cycle 67 Pembrolizumab  04/27/2023 Cycle 68 Pembrolizumab  05/18/2023 Cycle 69 pembrolizumab  06/08/2023 Cycle 70 Pembrolizumab  06/27/2023 Cycle 71 Pembrolizumab  07/19/2023 Cycle 72 pembrolizumab  08/09/2023 Pain secondary to metastatic lung cancer Chronic back pain Type 2 diabetes Essential hypertension CAD Hyperlipidemia Family history significant for multiple members with breast cancer Grade 1 skin rash 07/18/2019 likely related to immunotherapy.  Topical steroid cream as needed. E. coli urinary tract infection 07/14/2019.  Completed cephalexin . Edema/tenderness at the right greater than left ankle 10/21/2019-etiology unclear, potentially related to systemic therapy or an infection, doxycycline  prescribed-improved 10/23/2019; marked improvement 11/20/2019; at office visit 01/02/2020 she reports worsening of lower extremity edema, pain/tenderness, erythema 3 to 4 days following each treatment.  Alimta  held 01/02/2020.  Referral to dermatology. COVID-19 infection 03/30/2020, monoclonal antibody therapy 04/06/2020 Arthritis, possibly related to Pembrolizumab , improved since beginning Medrol , now followed by Dr. Mai Pain left femoral head/upper femur/groin-negative plain x-ray; CT pelvis 03/28/2022-no acute appearing bone abnormality at the left hip or involving the proximal left femur, no evidence of acute fracture or dislocation, soft tissues left hip and left groin unremarkable; osseous mets within the  left sacrum, left iliac bone and anterior acetabulum without evidence of associated fracture or dislocation. Percutaneous fixation of left pathologic acetabular fracture 08/23/2022 CT pelvis and left hip 11/02/2022-subacute nondisplaced fracture through the supra-acetabular left ilium extending through the acetabular roof, placement of 2 screws within the left ilium extending to the supra-acetabular region with an additional screw traversing the left superior pubic ramus, subtle sclerosis in the left ilium and superior left acetabulum, no new site of metastatic disease. CT pelvis 03/31/2023-mild progressive displacement of the left acetabular fracture, no fracture healing   15.  Admission 03/31/2023 after a fall, difficulty with ambulation secondary to generalized weakness and left hip pain 16. acute renal failure and mild rhabdomyolysis on hospital admission 03/31/2023 17.  Anemia secondary to chronic disease and bone marrow involvement with tumor    Disposition: Ms. Masullo has metastatic non-small cell lung cancer.  She is symptomatic with pain related to a metastasis/fracture at the left acetabulum.  She has been maintained chronically on pembrolizumab .  There is no clinical evidence of disease progression.  The plan is to continue pembrolizumab .  She will continue the current narcotic pain regimen.  Leg edema has improved with furosemide .  Arley Hof, MD  08/09/2023  11:19 AM

## 2023-08-13 DIAGNOSIS — M6281 Muscle weakness (generalized): Secondary | ICD-10-CM | POA: Diagnosis not present

## 2023-08-13 DIAGNOSIS — R2681 Unsteadiness on feet: Secondary | ICD-10-CM | POA: Diagnosis not present

## 2023-08-13 DIAGNOSIS — C7951 Secondary malignant neoplasm of bone: Secondary | ICD-10-CM | POA: Diagnosis not present

## 2023-08-13 DIAGNOSIS — R262 Difficulty in walking, not elsewhere classified: Secondary | ICD-10-CM | POA: Diagnosis not present

## 2023-08-13 DIAGNOSIS — N179 Acute kidney failure, unspecified: Secondary | ICD-10-CM | POA: Diagnosis not present

## 2023-08-15 DIAGNOSIS — C7951 Secondary malignant neoplasm of bone: Secondary | ICD-10-CM | POA: Diagnosis not present

## 2023-08-15 DIAGNOSIS — N179 Acute kidney failure, unspecified: Secondary | ICD-10-CM | POA: Diagnosis not present

## 2023-08-15 DIAGNOSIS — M6281 Muscle weakness (generalized): Secondary | ICD-10-CM | POA: Diagnosis not present

## 2023-08-16 DIAGNOSIS — R2681 Unsteadiness on feet: Secondary | ICD-10-CM | POA: Diagnosis not present

## 2023-08-16 DIAGNOSIS — M6281 Muscle weakness (generalized): Secondary | ICD-10-CM | POA: Diagnosis not present

## 2023-08-16 DIAGNOSIS — R262 Difficulty in walking, not elsewhere classified: Secondary | ICD-10-CM | POA: Diagnosis not present

## 2023-08-16 DIAGNOSIS — N179 Acute kidney failure, unspecified: Secondary | ICD-10-CM | POA: Diagnosis not present

## 2023-08-16 DIAGNOSIS — C7951 Secondary malignant neoplasm of bone: Secondary | ICD-10-CM | POA: Diagnosis not present

## 2023-08-19 ENCOUNTER — Encounter: Payer: Self-pay | Admitting: Oncology

## 2023-08-20 ENCOUNTER — Encounter: Payer: Self-pay | Admitting: Oncology

## 2023-08-20 ENCOUNTER — Telehealth: Payer: Self-pay

## 2023-08-20 ENCOUNTER — Encounter: Payer: Self-pay | Admitting: Nurse Practitioner

## 2023-08-20 DIAGNOSIS — M6281 Muscle weakness (generalized): Secondary | ICD-10-CM | POA: Diagnosis not present

## 2023-08-20 DIAGNOSIS — N179 Acute kidney failure, unspecified: Secondary | ICD-10-CM | POA: Diagnosis not present

## 2023-08-20 DIAGNOSIS — C7951 Secondary malignant neoplasm of bone: Secondary | ICD-10-CM | POA: Diagnosis not present

## 2023-08-20 NOTE — Telephone Encounter (Signed)
The patient sent a message indicating that her abdomen has become more swollen and feels hard. I reached out to her, and she reported that she has had a bowel movement, experienced no pain, and feels that the swelling has decreased.  Additionally, I spoke with the patient's daughter, who mentioned that the patient had been assessed at a facility, where the nurse suspected constipation. We offered her an appointment for further evaluation.

## 2023-08-21 DIAGNOSIS — C7951 Secondary malignant neoplasm of bone: Secondary | ICD-10-CM | POA: Diagnosis not present

## 2023-08-21 DIAGNOSIS — N179 Acute kidney failure, unspecified: Secondary | ICD-10-CM | POA: Diagnosis not present

## 2023-08-21 DIAGNOSIS — M6281 Muscle weakness (generalized): Secondary | ICD-10-CM | POA: Diagnosis not present

## 2023-08-21 DIAGNOSIS — R2681 Unsteadiness on feet: Secondary | ICD-10-CM | POA: Diagnosis not present

## 2023-08-21 DIAGNOSIS — R262 Difficulty in walking, not elsewhere classified: Secondary | ICD-10-CM | POA: Diagnosis not present

## 2023-08-22 ENCOUNTER — Telehealth: Payer: Self-pay

## 2023-08-22 DIAGNOSIS — C7951 Secondary malignant neoplasm of bone: Secondary | ICD-10-CM | POA: Diagnosis not present

## 2023-08-22 DIAGNOSIS — M6281 Muscle weakness (generalized): Secondary | ICD-10-CM | POA: Diagnosis not present

## 2023-08-22 DIAGNOSIS — N179 Acute kidney failure, unspecified: Secondary | ICD-10-CM | POA: Diagnosis not present

## 2023-08-22 NOTE — Telephone Encounter (Signed)
The patient's daughter called to cancel the appointment. I contacted the patient, who reported that she is feeling better and is no longer experiencing constipation; however, she mentioned that her stomach is soft. She has a Engineer, civil (consulting) at [facility] assessing her, and there are no new findings. The patient indicated that she plans to come in next week and has no issues to report.

## 2023-08-23 ENCOUNTER — Other Ambulatory Visit: Payer: Self-pay | Admitting: Nurse Practitioner

## 2023-08-23 ENCOUNTER — Ambulatory Visit: Payer: HMO | Admitting: Nurse Practitioner

## 2023-08-23 DIAGNOSIS — M6281 Muscle weakness (generalized): Secondary | ICD-10-CM | POA: Diagnosis not present

## 2023-08-23 DIAGNOSIS — R262 Difficulty in walking, not elsewhere classified: Secondary | ICD-10-CM | POA: Diagnosis not present

## 2023-08-23 DIAGNOSIS — C3492 Malignant neoplasm of unspecified part of left bronchus or lung: Secondary | ICD-10-CM

## 2023-08-23 DIAGNOSIS — R2681 Unsteadiness on feet: Secondary | ICD-10-CM | POA: Diagnosis not present

## 2023-08-23 DIAGNOSIS — C7951 Secondary malignant neoplasm of bone: Secondary | ICD-10-CM | POA: Diagnosis not present

## 2023-08-23 DIAGNOSIS — N179 Acute kidney failure, unspecified: Secondary | ICD-10-CM | POA: Diagnosis not present

## 2023-08-23 MED ORDER — OXYCODONE HCL 10 MG PO TABS: 10.0000 mg | ORAL_TABLET | Freq: Four times a day (QID) | ORAL | 0 refills | Status: DC | PRN

## 2023-08-23 NOTE — Telephone Encounter (Signed)
Forwarded to NP.

## 2023-08-27 DIAGNOSIS — C7951 Secondary malignant neoplasm of bone: Secondary | ICD-10-CM | POA: Diagnosis not present

## 2023-08-27 DIAGNOSIS — N179 Acute kidney failure, unspecified: Secondary | ICD-10-CM | POA: Diagnosis not present

## 2023-08-27 DIAGNOSIS — M6281 Muscle weakness (generalized): Secondary | ICD-10-CM | POA: Diagnosis not present

## 2023-08-28 DIAGNOSIS — N179 Acute kidney failure, unspecified: Secondary | ICD-10-CM | POA: Diagnosis not present

## 2023-08-28 DIAGNOSIS — C7951 Secondary malignant neoplasm of bone: Secondary | ICD-10-CM | POA: Diagnosis not present

## 2023-08-28 DIAGNOSIS — R2681 Unsteadiness on feet: Secondary | ICD-10-CM | POA: Diagnosis not present

## 2023-08-28 DIAGNOSIS — R262 Difficulty in walking, not elsewhere classified: Secondary | ICD-10-CM | POA: Diagnosis not present

## 2023-08-28 DIAGNOSIS — M6281 Muscle weakness (generalized): Secondary | ICD-10-CM | POA: Diagnosis not present

## 2023-08-29 DIAGNOSIS — N179 Acute kidney failure, unspecified: Secondary | ICD-10-CM | POA: Diagnosis not present

## 2023-08-29 DIAGNOSIS — C7951 Secondary malignant neoplasm of bone: Secondary | ICD-10-CM | POA: Diagnosis not present

## 2023-08-29 DIAGNOSIS — M6281 Muscle weakness (generalized): Secondary | ICD-10-CM | POA: Diagnosis not present

## 2023-08-30 ENCOUNTER — Encounter: Payer: Self-pay | Admitting: Nurse Practitioner

## 2023-08-30 ENCOUNTER — Inpatient Hospital Stay: Payer: HMO

## 2023-08-30 ENCOUNTER — Inpatient Hospital Stay (HOSPITAL_BASED_OUTPATIENT_CLINIC_OR_DEPARTMENT_OTHER): Payer: HMO | Admitting: Nurse Practitioner

## 2023-08-30 VITALS — BP 143/47 | HR 67 | Temp 98.1°F | Resp 18

## 2023-08-30 VITALS — BP 141/68 | HR 61 | Temp 98.1°F | Resp 18 | Ht 65.0 in | Wt 171.0 lb

## 2023-08-30 DIAGNOSIS — C3492 Malignant neoplasm of unspecified part of left bronchus or lung: Secondary | ICD-10-CM

## 2023-08-30 DIAGNOSIS — D649 Anemia, unspecified: Secondary | ICD-10-CM | POA: Diagnosis not present

## 2023-08-30 DIAGNOSIS — M6281 Muscle weakness (generalized): Secondary | ICD-10-CM | POA: Diagnosis not present

## 2023-08-30 DIAGNOSIS — R2681 Unsteadiness on feet: Secondary | ICD-10-CM | POA: Diagnosis not present

## 2023-08-30 DIAGNOSIS — Z95828 Presence of other vascular implants and grafts: Secondary | ICD-10-CM

## 2023-08-30 DIAGNOSIS — N179 Acute kidney failure, unspecified: Secondary | ICD-10-CM | POA: Diagnosis not present

## 2023-08-30 DIAGNOSIS — R262 Difficulty in walking, not elsewhere classified: Secondary | ICD-10-CM | POA: Diagnosis not present

## 2023-08-30 DIAGNOSIS — Z5112 Encounter for antineoplastic immunotherapy: Secondary | ICD-10-CM | POA: Diagnosis not present

## 2023-08-30 DIAGNOSIS — C7951 Secondary malignant neoplasm of bone: Secondary | ICD-10-CM | POA: Diagnosis not present

## 2023-08-30 LAB — CMP (CANCER CENTER ONLY)
ALT: 17 U/L (ref 0–44)
AST: 20 U/L (ref 15–41)
Albumin: 4 g/dL (ref 3.5–5.0)
Alkaline Phosphatase: 58 U/L (ref 38–126)
Anion gap: 6 (ref 5–15)
BUN: 25 mg/dL — ABNORMAL HIGH (ref 8–23)
CO2: 27 mmol/L (ref 22–32)
Calcium: 8.9 mg/dL (ref 8.9–10.3)
Chloride: 104 mmol/L (ref 98–111)
Creatinine: 1.12 mg/dL — ABNORMAL HIGH (ref 0.44–1.00)
GFR, Estimated: 48 mL/min — ABNORMAL LOW (ref >60)
Glucose, Bld: 118 mg/dL — ABNORMAL HIGH (ref 70–99)
Potassium: 3.8 mmol/L (ref 3.5–5.1)
Sodium: 137 mmol/L (ref 135–145)
Total Bilirubin: 0.3 mg/dL (ref 0.0–1.2)
Total Protein: 6.8 g/dL (ref 6.5–8.1)

## 2023-08-30 LAB — CBC WITH DIFFERENTIAL (CANCER CENTER ONLY)
Abs Immature Granulocytes: 0.01 10*3/uL (ref 0.00–0.07)
Basophils Absolute: 0 10*3/uL (ref 0.0–0.1)
Basophils Relative: 0 %
Eosinophils Absolute: 0.1 10*3/uL (ref 0.0–0.5)
Eosinophils Relative: 2 %
HCT: 31.2 % — ABNORMAL LOW (ref 36.0–46.0)
Hemoglobin: 9.7 g/dL — ABNORMAL LOW (ref 12.0–15.0)
Immature Granulocytes: 0 %
Lymphocytes Relative: 12 %
Lymphs Abs: 0.5 10*3/uL — ABNORMAL LOW (ref 0.7–4.0)
MCH: 28.1 pg (ref 26.0–34.0)
MCHC: 31.1 g/dL (ref 30.0–36.0)
MCV: 90.4 fL (ref 80.0–100.0)
Monocytes Absolute: 0.4 10*3/uL (ref 0.1–1.0)
Monocytes Relative: 10 %
Neutro Abs: 3.3 10*3/uL (ref 1.7–7.7)
Neutrophils Relative %: 76 %
Platelet Count: 151 10*3/uL (ref 150–400)
RBC: 3.45 MIL/uL — ABNORMAL LOW (ref 3.87–5.11)
RDW: 15.9 % — ABNORMAL HIGH (ref 11.5–15.5)
WBC Count: 4.3 10*3/uL (ref 4.0–10.5)
nRBC: 0 % (ref 0.0–0.2)

## 2023-08-30 LAB — FERRITIN: Ferritin: 41 ng/mL (ref 11–307)

## 2023-08-30 MED ORDER — SODIUM CHLORIDE 0.9% FLUSH
10.0000 mL | INTRAVENOUS | Status: DC | PRN
  Administered 2023-08-30: 10 mL

## 2023-08-30 MED ORDER — ZOLEDRONIC ACID 4 MG/5ML IV CONC
3.5000 mg | Freq: Once | INTRAVENOUS | Status: AC
  Administered 2023-08-30: 3.5 mg via INTRAVENOUS
  Filled 2023-08-30: qty 4.38

## 2023-08-30 MED ORDER — SODIUM CHLORIDE 0.9 % IV SOLN: Freq: Once | INTRAVENOUS | Status: AC

## 2023-08-30 MED ORDER — SODIUM CHLORIDE 0.9 % IV SOLN
200.0000 mg | Freq: Once | INTRAVENOUS | Status: AC
  Administered 2023-08-30: 200 mg via INTRAVENOUS
  Filled 2023-08-30: qty 8

## 2023-08-30 MED ORDER — SODIUM CHLORIDE 0.9% FLUSH
10.0000 mL | INTRAVENOUS | Status: DC | PRN
  Administered 2023-08-30: 10 mL via INTRAVENOUS

## 2023-08-30 MED ORDER — HEPARIN SOD (PORK) LOCK FLUSH 100 UNIT/ML IV SOLN
500.0000 [IU] | Freq: Once | INTRAVENOUS | Status: AC | PRN
  Administered 2023-08-30: 500 [IU]

## 2023-08-30 NOTE — Patient Instructions (Addendum)
[facility]  Discharge Instructions: Thank you for choosing [facility] to provide your oncology and hematology care.   If you have a lab appointment with the Cancer Center, please go directly to the Cancer Center and check in at the registration area.   Wear comfortable clothing and clothing appropriate for easy access to any Portacath or PICC line.   We strive to give you quality time with your provider. You may need to reschedule your appointment if you arrive late (15 or more minutes).  Arriving late affects you and other patients whose appointments are after yours.  Also, if you miss three or more appointments without notifying the office, you may be dismissed from the clinic at the provider's discretion.      For prescription refill requests, have your pharmacy contact our office and allow 72 hours for refills to be completed.    Today you received the following chemotherapy and/or immunotherapy agents: Pembrolizumab/Keytruda   To help prevent nausea and vomiting after your treatment, we encourage you to take your nausea medication as directed.  BELOW ARE SYMPTOMS THAT SHOULD BE REPORTED IMMEDIATELY: *FEVER GREATER THAN 100.4 F (38 C) OR HIGHER *CHILLS OR SWEATING *NAUSEA AND VOMITING THAT IS NOT CONTROLLED WITH YOUR NAUSEA MEDICATION *UNUSUAL SHORTNESS OF BREATH *UNUSUAL BRUISING OR BLEEDING *URINARY PROBLEMS (pain or burning when urinating, or frequent urination) *BOWEL PROBLEMS (unusual diarrhea, constipation, pain near the anus) TENDERNESS IN MOUTH AND THROAT WITH OR WITHOUT PRESENCE OF ULCERS (sore throat, sores in mouth, or a toothache) UNUSUAL RASH, SWELLING OR PAIN  UNUSUAL VAGINAL DISCHARGE OR ITCHING   Items with * indicate a potential emergency and should be followed up as soon as possible or go to the Emergency Department if any problems should occur.  Please show the CHEMOTHERAPY ALERT CARD or  IMMUNOTHERAPY ALERT CARD at check-in to the Emergency Department and triage nurse.  Should you have questions after your visit or need to cancel or reschedule your appointment, please contact [facility]  Dept: [phone]  and follow the prompts.  Office hours are 8:00 a.m. to 4:30 p.m. Monday - Friday. Please note that voicemails left after 4:00 p.m. may not be returned until the following business day.  We are closed weekends and major holidays. You have access to a nurse at all times for urgent questions. Please call the main number to the clinic Dept: [phone] and follow the prompts.   For any non-urgent questions, you may also contact your provider using MyChart. We now offer e-Visits for anyone 30 and older to request care online for non-urgent symptoms. For details visit [email or web address].   Also download the MyChart app! Go to the app store, search "MyChart", open the app, select [facility], and log in with your MyChart username and password.

## 2023-08-30 NOTE — Progress Notes (Signed)
[facility] OFFICE PROGRESS NOTE   Diagnosis: Non-small cell lung cancer  INTERVAL HISTORY:   [name] returns as scheduled.  She completed another cycle of Pembrolizumab 08/09/2023.  No rash.  Bowel habits alternate constipation and diarrhea.  She takes MiraLAX and tends to develop loose stools.  Left hip/leg pain is "up-and-down".  She thinks the recent increase in the Durogesic patch strength has helped.  She estimates taking breakthrough pain medication 2-3 times a day.  She continues physical therapy.  Sleep is an issue due to trouble finding a comfortable position.  Objective:  Vital signs in last 24 hours:  Blood pressure (!) 141/68, pulse 61, temperature 98.1 F (36.7 C), temperature source Temporal, resp. rate 18, height 5\' 5"  (1.651 m), weight 171 lb (77.6 kg), SpO2 96%.    HEENT: No thrush or ulcers. Resp: Lungs clear bilaterally. Cardio: Regular rate and rhythm. GI: Abdomen soft and nontender.  No hepatosplenomegaly. Vascular: Very minimal lower leg edema bilaterally. Neuro: Alert and oriented. Skin: No rash. Port-A-Cath without erythema.  Lab Results:  Lab Results  Component Value Date   WBC 4.3 08/30/2023   HGB 9.7 (L) 08/30/2023   HCT 31.2 (L) 08/30/2023   MCV 90.4 08/30/2023   PLT 151 08/30/2023   NEUTROABS 3.3 08/30/2023    Imaging:  No results found.  Medications: I have reviewed the patient's current medications.  Assessment/Plan: Non-small cell lung cancer MRI lumbar spine 04/29/2019- enlarging marrow lesions involving the L1 vertebral body, upper left sacrum and right iliac bone MRI pelvis 04/29/2019- 3.5 cm left iliac bone lesion appears slightly larger; other similar appearing lesions present within the left superior pubic ramus, left superior abdomen acetabulum and upper left sacrum Kappa free light chains with mild elevation 05/12/2019  CTs 05/12/2019- left lower lobe pulmonary mass 3.3 x 3.2 cm; lytic process left iliac bone;  spinal lesions; 1.1 cm low-density left kidney lesion; right thyroid enlargement with heterogeneous appearance with potential for multiple discrete lesions Biopsy left lower lobe lung mass 05/26/2019-poorly differentiated carcinoma; positive for cytokeratin 5/6, p63 and TTF-1, no EGFR, BRAF, ALK, ERBB2,ROS, or NTRK alteration Cycle 1 carboplatin/Alimta/pembrolizumab 06/06/2019 Cycle 2 carboplatin/Alimta/pembrolizumab 06/27/2019 Cycle 3 carboplatin/Alimta/pembrolizumab 07/18/2019 Cycle 4 carboplatin/Alimta/pembrolizumab 08/07/2019 CTs 08/27/2019-significant decrease in size of lobulated mass left lower lobe.  Unchanged appearance of subtle bone lesions. Cycle 5 Alimta/pembrolizumab 08/28/2019 Cycle 6 Alimta/pembrolizumab 09/18/2019 Cycle 7 Alimta/pembrolizumab 10/09/2019 Cycle 8 Alimta/pembrolizumab 10/30/2019 Cycle 9 Alimta/pembrolizumab 11/20/2019 CTs 12/09/2019-no evidence of disease progression, left lower lobe nodule slightly decreased in size, stable L1, left sacral, and left pubic ramus metastases Cycle 10 Alimta/pembrolizumab 12/11/2019 Cycle 11 pembrolizumab alone 01/02/2020 (Alimta held due to edema, tenderness, erythema at the lower legs) Cycle 12 pembrolizumab 01/23/2020 Cycle 13 pembrolizumab 02/12/2020 Cycle 14 pembrolizumab 03/04/2020 CTs 03/18/2020-stable left lower lobe lesion, mild sclerosis at the superior endplate of L1 that was previously hypermetabolic, stable small left upper sacral lucent lesion, previous left superior pubic ramus lesion is occult on the CT, CT head negative for malignancy Cycle 15 pembrolizumab 03/25/2020 Cycle 16 pembrolizumab 04/21/2020 Cycle 17 pembrolizumab 05/17/2020 05/19/2020 bone scan-no definite abnormalities to suggest osseous metastases.  Areas of concern on prior PET-CT involving left iliac bone and L1 vertebral body showed no abnormalities on the current study Cycle 18 Pembrolizumab 06/10/2020 Cycle 19 Pembrolizumab 06/30/2020 CTs 07/16/2020-stable left lower  lobe nodule, stable faint superior L1 vertebral lesion, no evidence of disease progression Cycle 20 pembrolizumab 07/21/2020 Cycle 21 Pembrolizumab 08/11/2020 Cycle 22 pembrolizumab 09/01/2020 Cycle 23 Pembrolizumab 09/22/2020  Cycle 24 pembrolizumab 10/13/2020 Cycle 25 Pembrolizumab 11/03/2020 Cycle 26 pembrolizumab 11/26/2020 CTs 12/15/2020- stable left lower lobe mass, stable sclerotic lesion at L2, no evidence of disease progression Cycle 27 pembrolizumab 12/17/2020 PET scan 01/03/2021-1.8 x 1.3 cm left lower lobe nodule similar in size to CT of 12/15/2020 and measures smaller than previous PET/CT from 2020.  Nodule is markedly hypermetabolic.  No evidence for hypermetabolic hilar or mediastinal lymphadenopathy.  Several tiny foci of hypermetabolism identified in bony anatomy raising concern for skeletal metastases.  Comparison of the PET to CT 07/16/2020-lobular left lower lobe pulmonary nodule measured 2.4 x 1.5 cm on the prior study, current study it measured 1.8 x 1.3 cm.  Lesion in the anterior left acetabulum is similar to the 07/16/2020 exam although overlying cortical thinning slightly more pronounced on the current study.  Described lesion in the scapula shows some cortical sclerosis and a tiny central marrow lucency not substantially changed compared to 07/16/2020.  Left third rib lesion shows heterogeneous mineralization similar to 07/16/2020. MRI of cervical spine 01/13/2021-increased left facet edema at C5-C6, severe facet arthrosis on the left at C7-T1 and on the right at C3-C4, no evidence of metastatic disease Cycle 28 Pembrolizumab 01/14/2021 Cycle 29 Pembrolizumab 02/03/2021 Cycle 30 Pembrolizumab 02/24/2021 MRI left hip 03/02/2021-compared to 04/29/2019, slight increase in size of metastases at the left iliac and left acetabulum.  Lesion at the left upper sacrum is less conspicuous and a lesion at the left superior pubic ramus is stable Cycle 31 pembrolizumab 03/17/2021 Radiation to left acetabulum  and left iliac 03/30/2021-04/13/2021 Cycle 32 pembrolizumab 04/07/2021 Cycle 33 Pembrolizumab 04/28/2021 Cycle 34 pembrolizumab 05/18/2021 PET scan 06/01/2021-no significant change in size or degree of FDG uptake associated with FDG avid left lower lobe lung nodule compatible with neoplasm.  Stable to improved appearance of multifocal FDG avid bone metastasis. Cycle 35 pembrolizumab 06/10/2021 Cycle 36 pembrolizumab 07/01/2021 Cycle 37 pembrolizumab 07/29/2021 Cycle 38 pembrolizumab 08/19/2021 Cycle 39 Pembrolizumab 09/09/2021 Cycle 40 pembrolizumab 09/30/2021 CT chest 10/20/2021 to evaluate complaints of chest pain-no PE.  Interval increase in left lower lobe mass. Cycle 41 Pembrolizumab 10/21/2021 Cycle 42 pembrolizumab 11/11/2021 PET 11/24/2021-mild increase in size of left lower lobe mass, no evidence of solid organ or nodal metastases, stable multifocal bone metastases.  No new sites of metastatic disease. Cycle 43 pembrolizumab 11/29/2021 Cycle 45 Pembrolizumab 12/20/2021 Cycle 46 Pembrolizumab 01/09/2022 Cycle 47 pembrolizumab 02/01/2022 Cycle 48 Pembrolizumab 02/24/2022 Cycle 49 Pembrolizumab 03/17/2022 Cycle 50 pembrolizumab 04/07/2022 MRI l pelvis 04/12/2022-"new "lesion at the left superior acetabulum, stable bone lesions at the left suprapubic ramus, left S1, and left iliac Cycle 51 pembrolizumab 04/28/2022 Palliative radiation to the left hip and left scapula 04/26/2022-05/09/2022 Cycle 52 pembrolizumab 05/18/2022 PET 06/02/2022-stable left lower lobe tumor, slight increase in hypermetabolism associated with the left acetabular metastasis, additional previously seen bone lesions no longer have hypermetabolism Cycle 53 pembrolizumab 06/08/2022 Cycle 54 pembrolizumab 06/29/2022 Cycle 55 pembrolizumab 07/20/2022 Cycle 56 pembrolizumab 08/10/2022 Cycle 57 pembrolizumab 08/31/2022 Cycle 58 pembrolizumab 09/21/2022 Cycle 59 pembrolizumab 10/12/2022 Cycle 60 Pembrolizumab 11/02/2022 Cycle 61 pembrolizumab  11/23/2022 Cycle 62 Pembrolizumab 12/21/2022 PET scan 12/29/2022-mild increase in size and activity of the left lower lobe mass Cycle 63 Pembrolizumab 01/11/2023 Cycle 64 Pembrolizumab 01/31/2023 Cycle 65 pembrolizumab 02/21/2023 Cycle 66 Pembrolizumab 03/14/2023 CT pelvis 03/31/2023-mild progressive displacement of the left acetabular fracture, no fracture healing, no new fractures, no new lytic or blastic lesions Cycle 67 Pembrolizumab 04/27/2023 Cycle 68 Pembrolizumab 05/18/2023 Cycle 69 pembrolizumab 06/08/2023 Cycle 70 Pembrolizumab 06/27/2023 Cycle 71 Pembrolizumab  07/19/2023 Cycle 72 pembrolizumab 08/09/2023 Cycle 73 Pembrolizumab 08/30/2023 Pain secondary to metastatic lung cancer Chronic back pain Type 2 diabetes Essential hypertension CAD Hyperlipidemia Family history significant for multiple members with breast cancer Grade 1 skin rash 07/18/2019 likely related to immunotherapy.  Topical steroid cream as needed. E. coli urinary tract infection 07/14/2019.  Completed cephalexin. Edema/tenderness at the right greater than left ankle 10/21/2019-etiology unclear, potentially related to systemic therapy or an infection, doxycycline prescribed-improved 10/23/2019; marked improvement 11/20/2019; at office visit 01/02/2020 she reports worsening of lower extremity edema, pain/tenderness, erythema 3 to 4 days following each treatment.  Alimta held 01/02/2020.  Referral to dermatology. COVID-19 infection 03/30/2020, monoclonal antibody therapy 04/06/2020 Arthritis, possibly related to Pembrolizumab, improved since beginning Medrol, now followed by Dr. Dierdre Forth Pain left femoral head/upper femur/groin-negative plain x-ray; CT pelvis 03/28/2022-no acute appearing bone abnormality at the left hip or involving the proximal left femur, no evidence of acute fracture or dislocation, soft tissues left hip and left groin unremarkable; osseous mets within the left sacrum, left iliac bone and anterior acetabulum without  evidence of associated fracture or dislocation. Percutaneous fixation of left pathologic acetabular fracture 08/23/2022 CT pelvis and left hip 11/02/2022-subacute nondisplaced fracture through the supra-acetabular left ilium extending through the acetabular roof, placement of 2 screws within the left ilium extending to the supra-acetabular region with an additional screw traversing the left superior pubic ramus, subtle sclerosis in the left ilium and superior left acetabulum, no new site of metastatic disease. CT pelvis 03/31/2023-mild progressive displacement of the left acetabular fracture, no fracture healing   15.  Admission 03/31/2023 after a fall, difficulty with ambulation secondary to generalized weakness and left hip pain 16. acute renal failure and mild rhabdomyolysis on hospital admission 03/31/2023 17.  Anemia secondary to chronic disease and bone marrow involvement with tumor  Disposition: Ms. Fiola appears stable.  There is no clinical evidence of disease progression.  Plan to continue Pembrolizumab every 3 weeks.  CBC and chemistry panel reviewed.  Labs adequate to proceed as above.  She notes improved pain control since increasing the Duragesic patch strength.  Plan to continue the same with oxycodone as needed for breakthrough pain.  She will return for follow-up and treatment in 3 weeks.    Lonna Cobb ANP/GNP-BC   08/30/2023  12:43 PM

## 2023-08-30 NOTE — Progress Notes (Signed)
Patient seen by Lonna Cobb NP today  Vitals are within treatment parameters:Yes   Labs are within treatment parameters: Yes   Treatment plan has been signed: Yes   Per physician team, Patient is ready for treatment and there are NO modifications to the treatment plan. Patient will need a ferritin draw.

## 2023-09-04 DIAGNOSIS — R262 Difficulty in walking, not elsewhere classified: Secondary | ICD-10-CM | POA: Diagnosis not present

## 2023-09-04 DIAGNOSIS — N179 Acute kidney failure, unspecified: Secondary | ICD-10-CM | POA: Diagnosis not present

## 2023-09-04 DIAGNOSIS — M6281 Muscle weakness (generalized): Secondary | ICD-10-CM | POA: Diagnosis not present

## 2023-09-04 DIAGNOSIS — R2681 Unsteadiness on feet: Secondary | ICD-10-CM | POA: Diagnosis not present

## 2023-09-04 DIAGNOSIS — C7951 Secondary malignant neoplasm of bone: Secondary | ICD-10-CM | POA: Diagnosis not present

## 2023-09-05 DIAGNOSIS — N179 Acute kidney failure, unspecified: Secondary | ICD-10-CM | POA: Diagnosis not present

## 2023-09-05 DIAGNOSIS — M6281 Muscle weakness (generalized): Secondary | ICD-10-CM | POA: Diagnosis not present

## 2023-09-05 DIAGNOSIS — C7951 Secondary malignant neoplasm of bone: Secondary | ICD-10-CM | POA: Diagnosis not present

## 2023-09-06 ENCOUNTER — Encounter: Payer: Self-pay | Admitting: Adult Health

## 2023-09-06 ENCOUNTER — Other Ambulatory Visit: Payer: Self-pay | Admitting: Adult Health

## 2023-09-06 ENCOUNTER — Non-Acute Institutional Stay: Payer: Medicare Other | Admitting: Adult Health

## 2023-09-06 ENCOUNTER — Ambulatory Visit: Payer: HMO | Admitting: Cardiovascular Disease

## 2023-09-06 DIAGNOSIS — G2581 Restless legs syndrome: Secondary | ICD-10-CM

## 2023-09-06 DIAGNOSIS — C3492 Malignant neoplasm of unspecified part of left bronchus or lung: Secondary | ICD-10-CM

## 2023-09-06 DIAGNOSIS — E1122 Type 2 diabetes mellitus with diabetic chronic kidney disease: Secondary | ICD-10-CM | POA: Diagnosis not present

## 2023-09-06 DIAGNOSIS — I25118 Atherosclerotic heart disease of native coronary artery with other forms of angina pectoris: Secondary | ICD-10-CM

## 2023-09-06 DIAGNOSIS — U071 COVID-19: Secondary | ICD-10-CM

## 2023-09-06 DIAGNOSIS — G893 Neoplasm related pain (acute) (chronic): Secondary | ICD-10-CM

## 2023-09-06 DIAGNOSIS — E785 Hyperlipidemia, unspecified: Secondary | ICD-10-CM

## 2023-09-06 DIAGNOSIS — G4700 Insomnia, unspecified: Secondary | ICD-10-CM

## 2023-09-06 DIAGNOSIS — N179 Acute kidney failure, unspecified: Secondary | ICD-10-CM | POA: Diagnosis not present

## 2023-09-06 DIAGNOSIS — R600 Localized edema: Secondary | ICD-10-CM

## 2023-09-06 DIAGNOSIS — F419 Anxiety disorder, unspecified: Secondary | ICD-10-CM

## 2023-09-06 DIAGNOSIS — R262 Difficulty in walking, not elsewhere classified: Secondary | ICD-10-CM | POA: Diagnosis not present

## 2023-09-06 DIAGNOSIS — R2681 Unsteadiness on feet: Secondary | ICD-10-CM | POA: Diagnosis not present

## 2023-09-06 DIAGNOSIS — N1832 Chronic kidney disease, stage 3b: Secondary | ICD-10-CM

## 2023-09-06 DIAGNOSIS — C7951 Secondary malignant neoplasm of bone: Secondary | ICD-10-CM | POA: Diagnosis not present

## 2023-09-06 DIAGNOSIS — M6281 Muscle weakness (generalized): Secondary | ICD-10-CM | POA: Diagnosis not present

## 2023-09-06 MED ORDER — MOLNUPIRAVIR EUA 200MG CAPSULE: 4.0000 | ORAL_CAPSULE | Freq: Two times a day (BID) | ORAL | 0 refills | Status: AC

## 2023-09-06 NOTE — Progress Notes (Signed)
 Location:  [facility] Room Number: AL28-A Place of Service:  ALF [phone]) Provider:  Medina-Vargas, Amaka Gluth, DNP, FNP-BC  Patient Care Team: Yolande Toribio MATSU, MD as PCP - General (Internal Medicine) Burnard Debby LABOR, MD as PCP - Cardiology (Cardiology)  Extended Emergency Contact Information Primary Emergency Contact: [facility] Address: [address]          [place], [place] [address] [place] Home Phone: [phone] Mobile Phone: [phone] Relation: Daughter Secondary Emergency Contact: Crawford,Kay Home Phone: [phone] Mobile Phone: [phone] Relation: Friend  Code Status:  DNR  Goals of care: Advanced Directive information    09/06/2023    9:45 AM  Advanced Directives  Does Patient Have a Medical Advance Directive? Yes  Type of Estate Agent of [place];Out of facility DNR (pink MOST or yellow form);Living will  Does patient want to make changes to medical advance directive? No - Patient declined  Copy of Healthcare Power of Attorney in Chart? Yes - validated most recent copy scanned in chart (See row information)  Pre-existing out of facility DNR order (yellow form or pink MOST form) Pink MOST form placed in chart (order not valid for inpatient use)     Chief Complaint  Patient presents with   Establish Care    a new patient for [facility] at [facility]    HPI:  [name] is a 89 y.o. female seen today to establish care with [facility]. She is a resident of [facility].  She was having nasal congestion and was lethargic yesterday. She tested positive for COVID-19. Latest GFR 42.6, 07/03/23.  She has non-small cell lung cancer with mets to the bone and followed up at the [facility]. She has pain on her left hip for which she is treated with Fentanyl  patch 37 mcg/hr Q 3 days, Gabapentin  300 mg daily and Oxycodone  10 mg Q 6 hours PRN.   She has type 2 DM with latest A1C 6.3 (07/03/23). She takes  Metformin  500 mg daily.    Past Medical History:  Diagnosis Date   Aneurysm (HCC)    Right eye a non DES stent was placed so that she would not required long term dual antiplatelet therapy.   Anginal pain (HCC)    Anxiety    not currently taking any meds   Arthritis    CAD (coronary artery disease)    Diabetes mellitus without complication (HCC)    H/O hiatal hernia    Heart murmur    History of stress test 03/31/2012   The post stress myocardial perfusion images show a normal pattern of perfusion in all region. The post left ventricles is normal in size. There is no scintigraphic of inductible myocardial ischemia. The post EF is 20   Hx of echocardiogram 11/29/2010   Ef 67% Normal size chambes, Aortic valve sclerosis without stenosis, No other significant valvular abnormalities, No percardial effusion.   Hyperlipemia    Hypertension    Insulin  resistance    Lung cancer (HCC)    Multiple thyroid  nodules    Neuromuscular disorder (HCC)    nerve pain after shingles   Osteoporosis    Restless legs    Shingles    Past Surgical History:  Procedure Laterality Date   ABDOMINAL HYSTERECTOMY  1970   ANGIOPLASTY     Stenting of a 90% eccentric right coronary artery stenosis and had a 3.0x15 mm Integrity bare-metal stent inserted   ANTERIOR CERVICAL DECOMP/DISCECTOMY FUSION N/A 07/30/2013  Procedure: ANTERIOR CERVICAL DECOMPRESSION/DISCECTOMY FUSION CERVICAL FIVE -SIX;  Surgeon: Alm GORMAN Molt, MD;  Location: [facility] NEURO ORS;  Service: Neurosurgery;  Laterality: N/A;   APPENDECTOMY     BREAST SURGERY Left    cysts removed from left breast (in her 20'2)   CARDIAC CATHETERIZATION     Showed a widely patent stent, she did have 60% ostial diagonal-1 stenosis, She also had mild luminal irregularities of her LAD.   COLONOSCOPY     EYE SURGERY Bilateral 2009   cateract surgery- bilateral   IR IMAGING GUIDED PORT INSERTION  06/04/2019   LAMINECTOMY WITH POSTERIOR LATERAL ARTHRODESIS LEVEL 2  Right 07/18/2021   Procedure: Laminectomy and Foraminotomy - Lumbar four-Lumbar five - Lumbar five-Sacral one- right, posterolateral fusion Lumbar four-five with interspinous plate;  Surgeon: Molt Alm GORMAN, MD;  Location: [facility] OR;  Service: Neurosurgery;  Laterality: Right;   LEFT HEART CATH AND CORONARY ANGIOGRAPHY N/A 11/15/2021   Procedure: LEFT HEART CATH AND CORONARY ANGIOGRAPHY;  Surgeon: Burnard Debby LABOR, MD;  Location: [facility] INVASIVE CV LAB;  Service: Cardiovascular;  Laterality: N/A;   REFRACTIVE SURGERY Left 2011   TONSILLECTOMY      Allergies  Allergen Reactions   Codeine Nausea And Vomiting and Other (See Comments)    Severe constipation.    Outpatient Encounter Medications as of 09/06/2023  Medication Sig   acetaminophen  (TYLENOL ) 500 MG tablet Take 2 tablets (1,000 mg total) by mouth every 8 (eight) hours as needed.   ALPRAZolam  (XANAX ) 0.25 MG tablet Take 0.25 mg by mouth 3 (three) times daily as needed for anxiety.   aspirin  EC 81 MG tablet Take 1 tablet (81 mg total) by mouth daily. Swallow whole.   carboxymethylcellulose (REFRESH PLUS) 0.5 % SOLN Place 1 drop into both eyes 3 (three) times daily as needed (dry eyes).   fentaNYL  (DURAGESIC ) 12 MCG/HR Duragesic  patch 25 mcg + 12 mcg total dose 37 mcg every 3 days   fentaNYL  (DURAGESIC ) 25 MCG/HR Place 1 patch onto the skin every 3 (three) days. Plus 12 mcg patch total dose 37 mcg every 3 days   furosemide  (LASIX ) 20 MG tablet Take 20 mg by mouth daily. one time a day every other day for edema   gabapentin  (NEURONTIN ) 300 MG capsule Take 300 mg by mouth at bedtime.    loperamide  (IMODIUM ) 2 MG capsule Take 2-4 mg by mouth as needed for diarrhea or loose stools.   metFORMIN  (GLUCOPHAGE ) 500 MG tablet Take 500 mg by mouth at bedtime.   methylPREDNISolone  (MEDROL ) 4 MG tablet Take 4 mg by mouth daily. Take 1/2 tablet by mouth daily   Multiple Vitamins-Minerals (CENTRUM SILVER PO) Take 1 tablet by mouth daily.   nitroGLYCERIN   (NITROSTAT ) 0.4 MG SL tablet Place 0.4 mg under the tongue every 5 (five) minutes as needed for chest pain.   ondansetron  (ZOFRAN ) 8 MG tablet Take 8 mg by mouth every 8 (eight) hours as needed for nausea or vomiting.   Oxycodone  HCl 10 MG TABS Take 1 tablet (10 mg total) by mouth every 6 (six) hours as needed.   polyethylene glycol (MIRALAX  / GLYCOLAX ) packet Take 17 g by mouth daily.   polyvinyl alcohol  (LIQUIFILM TEARS) 1.4 % ophthalmic solution Place 1 drop into both eyes every 8 (eight) hours as needed for dry eyes.   Prenatal Vit-Fe Fumarate-FA (PRENATAL MULTIVITAMIN/IRON PO) Take 1 tablet by mouth daily.   rOPINIRole  (REQUIP ) 1 MG tablet Take 1 mg by mouth 3 (three) times daily.  rosuvastatin  (CRESTOR ) 40 MG tablet Take 40 mg by mouth daily.   traZODone  (DESYREL ) 150 MG tablet Take 1 tablet (150 mg total) by mouth at bedtime.   Vitamin D , Ergocalciferol , (DRISDOL ) 50000 UNITS CAPS capsule Take 50,000 Units by mouth every Sunday.   zinc gluconate 50 MG tablet Take 50 mg by mouth at bedtime.   Magnesium 400 MG CAPS Take 250 mg by mouth as needed. Takes with pain med (Patient not taking: Reported on 09/06/2023)   molnupiravir EUA (LAGEVRIO) 200 mg CAPS capsule Take 4 capsules (800 mg total) by mouth 2 (two) times daily for 5 days. (Patient not taking: Reported on 09/06/2023)   mupirocin cream (BACTROBAN) 2 % Apply 1 Application topically 2 (two) times daily. (Patient not taking: Reported on 08/09/2023)   No facility-administered encounter medications on file as of 09/06/2023.    Review of Systems  Constitutional:  Positive for appetite change. Negative for chills, fatigue and fever.       Poor appetite  HENT:  Positive for congestion. Negative for hearing loss, rhinorrhea and sore throat.        Nasal congestion  Eyes: Negative.   Respiratory:  Negative for cough, shortness of breath and wheezing.   Cardiovascular:  Negative for chest pain, palpitations and leg swelling.  Gastrointestinal:   Negative for abdominal pain, constipation, diarrhea, nausea and vomiting.  Genitourinary:  Negative for dysuria.  Musculoskeletal:  Negative for arthralgias, back pain and myalgias.  Skin:  Negative for color change, rash and wound.  Neurological:  Positive for weakness. Negative for dizziness and headaches.  Psychiatric/Behavioral:  Negative for behavioral problems. The patient is not nervous/anxious.      Immunization History  Administered Date(s) Administered   Fluad Quad(high Dose 65+) 04/28/2022   Fluad Trivalent(High Dose 65+) 04/07/2023   Influenza Split 09/21/2010, 04/25/2011, 05/21/2012, 05/28/2013, 04/30/2014, 04/30/2021   Influenza, High Dose Seasonal PF 04/30/2017, 05/14/2019   Influenza, Quadrivalent, Recombinant, Inj, Pf 06/03/2018, 05/15/2020   Influenza,inj,Quad PF,6+ Mos 04/30/2014, 04/24/2015   Influenza-Unspecified 04/30/2017, 05/03/2020   PFIZER Comirnaty(Gray Top)Covid-19 Tri-Sucrose Vaccine 11/03/2020   PFIZER(Purple Top)SARS-COV-2 Vaccination 08/19/2019, 09/09/2019, 03/25/2020   Pfizer Covid-19 Vaccine Bivalent Booster 12yrs & up 07/01/2021   Pneumococcal Conjugate-13 05/29/2019   Pneumococcal-Unspecified 07/31/1993, 02/26/2007   Respiratory Syncytial Virus Vaccine,Recomb Aduvanted(Arexvy) 05/04/2022   Tdap 11/28/2004   Zoster Recombinant(Shingrix) 04/04/2018   Zoster, Live 07/31/2008   Pertinent  Health Maintenance Due  Topic Date Due   FOOT EXAM  Never done   OPHTHALMOLOGY EXAM  Never done   DEXA SCAN  Never done   Colonoscopy  04/08/2021   HEMOGLOBIN A1C  10/02/2023   INFLUENZA VACCINE  Completed      10 /19/2023   11:15 AM 06/08/2022   10:24 AM 06/29/2022   10:36 AM 07/20/2022    8:12 AM 08/10/2022    9:47 AM  Fall Risk  (RETIRED) Patient Fall Risk Level High fall risk High fall risk High fall risk High fall risk High fall risk     Vitals:   09/06/23 0935  BP: 120/64  Pulse: 62  Resp: 16  Temp: (!) 97.1 F (36.2 C)  SpO2: 96%  Weight:  169 lb 6.4 oz (76.8 kg)  Height: 5' 3 (1.6 m)   Body mass index is 30.01 kg/m.  Physical Exam Constitutional:      Appearance: She is obese.  HENT:     Head: Normocephalic and atraumatic.     Nose: Nose normal.     Mouth/Throat:  Mouth: Mucous membranes are moist.  Eyes:     Conjunctiva/sclera: Conjunctivae normal.  Cardiovascular:     Rate and Rhythm: Normal rate and regular rhythm.  Pulmonary:     Effort: Pulmonary effort is normal.     Breath sounds: Normal breath sounds.  Abdominal:     General: Bowel sounds are normal.     Palpations: Abdomen is soft.  Musculoskeletal:     Cervical back: Normal range of motion.     Right lower leg: Edema present.     Left lower leg: Edema present.     Comments: BLE 2+edema  Skin:    General: Skin is warm and dry.  Neurological:     General: No focal deficit present.     Mental Status: She is alert and oriented to person, place, and time.  Psychiatric:        Mood and Affect: Mood normal.        Behavior: Behavior normal.        Thought Content: Thought content normal.        Judgment: Judgment normal.        Labs reviewed: Recent Labs    04/05/23 1023 04/06/23 0727 04/07/23 0809 04/16/23 0000 08/02/23 1438 08/09/23 0954 08/30/23 1116  NA 136 138 136   < > 138 137 137  K 3.9 4.2 4.2   < > 4.0 3.5 3.8  CL 105 103 101   < > 107 104 104  CO2 24 24 26    < > 24 25 27   GLUCOSE 157* 104* 128*   < > 134* 165* 118*  BUN 17 17 21    < > 26* 22 25*  CREATININE 1.34* 1.26* 1.42*   < > 1.13* 1.23* 1.12*  CALCIUM  9.3 9.7 9.7   < > 8.9 9.2 8.9  MG 1.8 1.9 1.9  --   --   --   --   PHOS 3.1 3.7 4.1  --   --   --   --    < > = values in this interval not displayed.   Recent Labs    08/02/23 1438 08/09/23 0954 08/30/23 1116  AST 21 23 20   ALT 18 17 17   ALKPHOS 62 61 58  BILITOT 0.3 0.3 0.3  PROT 7.0 7.0 6.8  ALBUMIN 4.0 4.2 4.0   Recent Labs    07/19/23 1053 08/09/23 0954 08/30/23 1116  WBC 4.8 4.5 4.3   NEUTROABS 3.4 3.3 3.3  HGB 9.8* 10.0* 9.7*  HCT 31.0* 31.9* 31.2*  MCV 90.4 89.4 90.4  PLT 147* 150 151   Lab Results  Component Value Date   TSH 1.442 07/19/2023   Lab Results  Component Value Date   HGBA1C 7.0 (H) 04/04/2023   Lab Results  Component Value Date   CHOL 129 04/13/2015   HDL 57 04/13/2015   LDLCALC 55 04/13/2015   TRIG 87 04/13/2015   CHOLHDL 2.3 04/13/2015    Significant Diagnostic Results in last 30 days:  No results found.  Assessment/Plan  1. COVID-19 (Primary) -  will start on Molnupiravir  800 mg BID X 5 days -  continue Zinc 50 mg daily and Vitamin D3 50,000 units Q 14 days -  special contact and respiratory droplet isolation  2. Lung cancer, primary, with metastasis from lung to other site, left [facility]) Metastasis to bone [facility]) -  follows up at [facility]  3. Type 2 diabetes mellitus with stage 3b chronic kidney disease, without long-term  current use of insulin  (HCC) -  A1C 6.3, 07/03/23 -  continue Metformin   4. Chronic pain due to neoplasm -  continue Fentanyl  patch, Neurontin  and Oxycodone  PRN  5. Coronary artery disease with exertional angina (HCC) -  denies chest pain -  continue ASA 81 mg daily and NTG PRN  6. Anxiety -  mood is stable -  continue Alprazolam  0.25 mg TID  7. Bilateral lower extremity edema -  continue Lasix  20 mg Q other day   8. Restless leg syndrome -  continue Ropinirole  1 mg TID  9. Insomnia, unspecified type -  continue Trazodone  150 mg Q HS  10. Hyperlipidemia with target LDL less than 70 -  continue Rosuvastatin  40 mg daily    Family/ staff Communication: Discussed plan of care with resident and charge nurse.  Labs/tests ordered:  None    Abimbola Aki Medina-Vargas, DNP, MSN, FNP-BC [facility] [phone] (Monday-Friday 8:00 a.m. - 5:00 p.m.) [phone] (after hours)

## 2023-09-07 ENCOUNTER — Other Ambulatory Visit: Payer: Self-pay | Admitting: Orthopedic Surgery

## 2023-09-07 DIAGNOSIS — C3492 Malignant neoplasm of unspecified part of left bronchus or lung: Secondary | ICD-10-CM

## 2023-09-07 MED ORDER — FENTANYL 12 MCG/HR TD PT72: MEDICATED_PATCH | TRANSDERMAL | 0 refills | Status: DC

## 2023-09-07 MED ORDER — FENTANYL 25 MCG/HR TD PT72: 1.0000 | MEDICATED_PATCH | TRANSDERMAL | 0 refills | Status: DC

## 2023-09-10 ENCOUNTER — Telehealth: Payer: Self-pay | Admitting: *Deleted

## 2023-09-10 ENCOUNTER — Encounter (HOSPITAL_COMMUNITY): Payer: Self-pay | Admitting: Internal Medicine

## 2023-09-10 ENCOUNTER — Inpatient Hospital Stay (HOSPITAL_COMMUNITY)
Admission: EM | Admit: 2023-09-10 | Discharge: 2023-09-29 | DRG: 177 | Disposition: E | Payer: PPO | Source: Skilled Nursing Facility | Attending: Internal Medicine | Admitting: Internal Medicine

## 2023-09-10 ENCOUNTER — Encounter: Payer: Self-pay | Admitting: Nurse Practitioner

## 2023-09-10 ENCOUNTER — Emergency Department (HOSPITAL_COMMUNITY): Payer: PPO

## 2023-09-10 ENCOUNTER — Inpatient Hospital Stay (HOSPITAL_COMMUNITY): Payer: PPO

## 2023-09-10 ENCOUNTER — Other Ambulatory Visit: Payer: Self-pay

## 2023-09-10 ENCOUNTER — Encounter: Payer: Self-pay | Admitting: Oncology

## 2023-09-10 DIAGNOSIS — Z66 Do not resuscitate: Secondary | ICD-10-CM | POA: Diagnosis not present

## 2023-09-10 DIAGNOSIS — E8779 Other fluid overload: Secondary | ICD-10-CM | POA: Diagnosis present

## 2023-09-10 DIAGNOSIS — E042 Nontoxic multinodular goiter: Secondary | ICD-10-CM | POA: Diagnosis present

## 2023-09-10 DIAGNOSIS — J90 Pleural effusion, not elsewhere classified: Secondary | ICD-10-CM | POA: Diagnosis not present

## 2023-09-10 DIAGNOSIS — G893 Neoplasm related pain (acute) (chronic): Secondary | ICD-10-CM | POA: Diagnosis present

## 2023-09-10 DIAGNOSIS — Z9889 Other specified postprocedural states: Secondary | ICD-10-CM

## 2023-09-10 DIAGNOSIS — E785 Hyperlipidemia, unspecified: Secondary | ICD-10-CM | POA: Diagnosis present

## 2023-09-10 DIAGNOSIS — R7981 Abnormal blood-gas level: Secondary | ICD-10-CM | POA: Diagnosis present

## 2023-09-10 DIAGNOSIS — J9811 Atelectasis: Secondary | ICD-10-CM | POA: Diagnosis not present

## 2023-09-10 DIAGNOSIS — Z803 Family history of malignant neoplasm of breast: Secondary | ICD-10-CM

## 2023-09-10 DIAGNOSIS — E860 Dehydration: Secondary | ICD-10-CM | POA: Diagnosis present

## 2023-09-10 DIAGNOSIS — U071 COVID-19: Principal | ICD-10-CM | POA: Diagnosis present

## 2023-09-10 DIAGNOSIS — R0902 Hypoxemia: Secondary | ICD-10-CM | POA: Diagnosis not present

## 2023-09-10 DIAGNOSIS — F419 Anxiety disorder, unspecified: Secondary | ICD-10-CM | POA: Diagnosis present

## 2023-09-10 DIAGNOSIS — R531 Weakness: Secondary | ICD-10-CM | POA: Diagnosis not present

## 2023-09-10 DIAGNOSIS — I25118 Atherosclerotic heart disease of native coronary artery with other forms of angina pectoris: Secondary | ICD-10-CM | POA: Diagnosis present

## 2023-09-10 DIAGNOSIS — E872 Acidosis, unspecified: Secondary | ICD-10-CM | POA: Diagnosis not present

## 2023-09-10 DIAGNOSIS — Y95 Nosocomial condition: Secondary | ICD-10-CM | POA: Diagnosis present

## 2023-09-10 DIAGNOSIS — R7989 Other specified abnormal findings of blood chemistry: Secondary | ICD-10-CM | POA: Diagnosis present

## 2023-09-10 DIAGNOSIS — N170 Acute kidney failure with tubular necrosis: Secondary | ICD-10-CM | POA: Diagnosis present

## 2023-09-10 DIAGNOSIS — M810 Age-related osteoporosis without current pathological fracture: Secondary | ICD-10-CM | POA: Diagnosis present

## 2023-09-10 DIAGNOSIS — D630 Anemia in neoplastic disease: Secondary | ICD-10-CM | POA: Diagnosis present

## 2023-09-10 DIAGNOSIS — Z515 Encounter for palliative care: Secondary | ICD-10-CM | POA: Diagnosis not present

## 2023-09-10 DIAGNOSIS — E861 Hypovolemia: Secondary | ICD-10-CM | POA: Diagnosis present

## 2023-09-10 DIAGNOSIS — E440 Moderate protein-calorie malnutrition: Secondary | ICD-10-CM | POA: Diagnosis not present

## 2023-09-10 DIAGNOSIS — C3432 Malignant neoplasm of lower lobe, left bronchus or lung: Secondary | ICD-10-CM | POA: Diagnosis not present

## 2023-09-10 DIAGNOSIS — C7801 Secondary malignant neoplasm of right lung: Secondary | ICD-10-CM | POA: Diagnosis present

## 2023-09-10 DIAGNOSIS — N1832 Chronic kidney disease, stage 3b: Secondary | ICD-10-CM | POA: Diagnosis not present

## 2023-09-10 DIAGNOSIS — Z8616 Personal history of COVID-19: Secondary | ICD-10-CM | POA: Diagnosis not present

## 2023-09-10 DIAGNOSIS — R001 Bradycardia, unspecified: Secondary | ICD-10-CM | POA: Diagnosis not present

## 2023-09-10 DIAGNOSIS — B0229 Other postherpetic nervous system involvement: Secondary | ICD-10-CM | POA: Diagnosis present

## 2023-09-10 DIAGNOSIS — Z955 Presence of coronary angioplasty implant and graft: Secondary | ICD-10-CM

## 2023-09-10 DIAGNOSIS — K219 Gastro-esophageal reflux disease without esophagitis: Secondary | ICD-10-CM | POA: Diagnosis present

## 2023-09-10 DIAGNOSIS — E871 Hypo-osmolality and hyponatremia: Secondary | ICD-10-CM | POA: Diagnosis present

## 2023-09-10 DIAGNOSIS — R918 Other nonspecific abnormal finding of lung field: Secondary | ICD-10-CM | POA: Diagnosis not present

## 2023-09-10 DIAGNOSIS — Z79899 Other long term (current) drug therapy: Secondary | ICD-10-CM

## 2023-09-10 DIAGNOSIS — A0839 Other viral enteritis: Secondary | ICD-10-CM | POA: Diagnosis not present

## 2023-09-10 DIAGNOSIS — Z7982 Long term (current) use of aspirin: Secondary | ICD-10-CM

## 2023-09-10 DIAGNOSIS — G2581 Restless legs syndrome: Secondary | ICD-10-CM | POA: Diagnosis present

## 2023-09-10 DIAGNOSIS — I129 Hypertensive chronic kidney disease with stage 1 through stage 4 chronic kidney disease, or unspecified chronic kidney disease: Secondary | ICD-10-CM | POA: Diagnosis not present

## 2023-09-10 DIAGNOSIS — C3492 Malignant neoplasm of unspecified part of left bronchus or lung: Secondary | ICD-10-CM | POA: Diagnosis present

## 2023-09-10 DIAGNOSIS — Z789 Other specified health status: Secondary | ICD-10-CM

## 2023-09-10 DIAGNOSIS — Z820 Family history of epilepsy and other diseases of the nervous system: Secondary | ICD-10-CM

## 2023-09-10 DIAGNOSIS — E119 Type 2 diabetes mellitus without complications: Secondary | ICD-10-CM

## 2023-09-10 DIAGNOSIS — R34 Anuria and oliguria: Secondary | ICD-10-CM | POA: Diagnosis not present

## 2023-09-10 DIAGNOSIS — R680 Hypothermia, not associated with low environmental temperature: Secondary | ICD-10-CM | POA: Diagnosis not present

## 2023-09-10 DIAGNOSIS — Z634 Disappearance and death of family member: Secondary | ICD-10-CM

## 2023-09-10 DIAGNOSIS — E1122 Type 2 diabetes mellitus with diabetic chronic kidney disease: Secondary | ICD-10-CM | POA: Diagnosis present

## 2023-09-10 DIAGNOSIS — I878 Other specified disorders of veins: Secondary | ICD-10-CM | POA: Diagnosis present

## 2023-09-10 DIAGNOSIS — Z95820 Peripheral vascular angioplasty status with implants and grafts: Secondary | ICD-10-CM

## 2023-09-10 DIAGNOSIS — I959 Hypotension, unspecified: Secondary | ICD-10-CM | POA: Diagnosis not present

## 2023-09-10 DIAGNOSIS — D696 Thrombocytopenia, unspecified: Secondary | ICD-10-CM | POA: Diagnosis present

## 2023-09-10 DIAGNOSIS — Z7984 Long term (current) use of oral hypoglycemic drugs: Secondary | ICD-10-CM

## 2023-09-10 DIAGNOSIS — C7951 Secondary malignant neoplasm of bone: Secondary | ICD-10-CM | POA: Diagnosis present

## 2023-09-10 DIAGNOSIS — J9601 Acute respiratory failure with hypoxia: Secondary | ICD-10-CM | POA: Diagnosis present

## 2023-09-10 DIAGNOSIS — G9341 Metabolic encephalopathy: Secondary | ICD-10-CM | POA: Diagnosis not present

## 2023-09-10 DIAGNOSIS — E669 Obesity, unspecified: Secondary | ICD-10-CM | POA: Diagnosis present

## 2023-09-10 DIAGNOSIS — N179 Acute kidney failure, unspecified: Principal | ICD-10-CM | POA: Diagnosis present

## 2023-09-10 DIAGNOSIS — Z95828 Presence of other vascular implants and grafts: Secondary | ICD-10-CM

## 2023-09-10 DIAGNOSIS — I10 Essential (primary) hypertension: Secondary | ICD-10-CM | POA: Diagnosis present

## 2023-09-10 DIAGNOSIS — M6282 Rhabdomyolysis: Secondary | ICD-10-CM | POA: Diagnosis not present

## 2023-09-10 DIAGNOSIS — Z833 Family history of diabetes mellitus: Secondary | ICD-10-CM

## 2023-09-10 DIAGNOSIS — J180 Bronchopneumonia, unspecified organism: Secondary | ICD-10-CM | POA: Diagnosis present

## 2023-09-10 DIAGNOSIS — Z87891 Personal history of nicotine dependence: Secondary | ICD-10-CM

## 2023-09-10 DIAGNOSIS — D649 Anemia, unspecified: Secondary | ICD-10-CM | POA: Diagnosis not present

## 2023-09-10 DIAGNOSIS — Z90710 Acquired absence of both cervix and uterus: Secondary | ICD-10-CM

## 2023-09-10 DIAGNOSIS — R112 Nausea with vomiting, unspecified: Secondary | ICD-10-CM | POA: Diagnosis not present

## 2023-09-10 DIAGNOSIS — Z981 Arthrodesis status: Secondary | ICD-10-CM

## 2023-09-10 DIAGNOSIS — R509 Fever, unspecified: Secondary | ICD-10-CM | POA: Diagnosis not present

## 2023-09-10 DIAGNOSIS — Z79891 Long term (current) use of opiate analgesic: Secondary | ICD-10-CM

## 2023-09-10 DIAGNOSIS — R7401 Elevation of levels of liver transaminase levels: Secondary | ICD-10-CM | POA: Diagnosis present

## 2023-09-10 DIAGNOSIS — Z885 Allergy status to narcotic agent status: Secondary | ICD-10-CM

## 2023-09-10 DIAGNOSIS — Z85118 Personal history of other malignant neoplasm of bronchus and lung: Secondary | ICD-10-CM

## 2023-09-10 DIAGNOSIS — N1831 Chronic kidney disease, stage 3a: Secondary | ICD-10-CM | POA: Diagnosis not present

## 2023-09-10 DIAGNOSIS — Z8249 Family history of ischemic heart disease and other diseases of the circulatory system: Secondary | ICD-10-CM

## 2023-09-10 DIAGNOSIS — I2510 Atherosclerotic heart disease of native coronary artery without angina pectoris: Secondary | ICD-10-CM | POA: Diagnosis present

## 2023-09-10 DIAGNOSIS — M545 Low back pain, unspecified: Secondary | ICD-10-CM | POA: Diagnosis present

## 2023-09-10 LAB — MAGNESIUM: Magnesium: 3.2 mg/dL — ABNORMAL HIGH (ref 1.7–2.4)

## 2023-09-10 LAB — URINALYSIS, W/ REFLEX TO CULTURE (INFECTION SUSPECTED)
Bilirubin Urine: NEGATIVE
Glucose, UA: NEGATIVE mg/dL
Ketones, ur: NEGATIVE mg/dL
Leukocytes,Ua: NEGATIVE
Nitrite: NEGATIVE
Protein, ur: 100 mg/dL — AB
Specific Gravity, Urine: 1.011 (ref 1.005–1.030)
pH: 5 (ref 5.0–8.0)

## 2023-09-10 LAB — BRAIN NATRIURETIC PEPTIDE: B Natriuretic Peptide: 191.3 pg/mL — ABNORMAL HIGH (ref 0.0–100.0)

## 2023-09-10 LAB — COMPREHENSIVE METABOLIC PANEL
ALT: 34 U/L (ref 0–44)
AST: 71 U/L — ABNORMAL HIGH (ref 15–41)
Albumin: 2.9 g/dL — ABNORMAL LOW (ref 3.5–5.0)
Alkaline Phosphatase: 62 U/L (ref 38–126)
Anion gap: 13 (ref 5–15)
BUN: 68 mg/dL — ABNORMAL HIGH (ref 8–23)
CO2: 16 mmol/L — ABNORMAL LOW (ref 22–32)
Calcium: 7.4 mg/dL — ABNORMAL LOW (ref 8.9–10.3)
Chloride: 104 mmol/L (ref 98–111)
Creatinine, Ser: 6.93 mg/dL — ABNORMAL HIGH (ref 0.44–1.00)
GFR, Estimated: 5 mL/min — ABNORMAL LOW (ref >60)
Glucose, Bld: 88 mg/dL (ref 70–99)
Potassium: 4.3 mmol/L (ref 3.5–5.1)
Sodium: 133 mmol/L — ABNORMAL LOW (ref 135–145)
Total Bilirubin: 0.6 mg/dL (ref 0.0–1.2)
Total Protein: 6.4 g/dL — ABNORMAL LOW (ref 6.5–8.1)

## 2023-09-10 LAB — EXPECTORATED SPUTUM ASSESSMENT W GRAM STAIN, RFLX TO RESP C

## 2023-09-10 LAB — CBC WITH DIFFERENTIAL/PLATELET
Abs Immature Granulocytes: 0.08 10*3/uL — ABNORMAL HIGH (ref 0.00–0.07)
Basophils Absolute: 0 10*3/uL (ref 0.0–0.1)
Basophils Relative: 0 %
Eosinophils Absolute: 0 10*3/uL (ref 0.0–0.5)
Eosinophils Relative: 0 %
HCT: 30 % — ABNORMAL LOW (ref 36.0–46.0)
Hemoglobin: 9.2 g/dL — ABNORMAL LOW (ref 12.0–15.0)
Immature Granulocytes: 1 %
Lymphocytes Relative: 5 %
Lymphs Abs: 0.3 10*3/uL — ABNORMAL LOW (ref 0.7–4.0)
MCH: 28.2 pg (ref 26.0–34.0)
MCHC: 30.7 g/dL (ref 30.0–36.0)
MCV: 92 fL (ref 80.0–100.0)
Monocytes Absolute: 0.4 10*3/uL (ref 0.1–1.0)
Monocytes Relative: 6 %
Neutro Abs: 6.5 10*3/uL (ref 1.7–7.7)
Neutrophils Relative %: 88 %
Platelets: 115 10*3/uL — ABNORMAL LOW (ref 150–400)
RBC: 3.26 MIL/uL — ABNORMAL LOW (ref 3.87–5.11)
RDW: 15.7 % — ABNORMAL HIGH (ref 11.5–15.5)
WBC: 7.3 10*3/uL (ref 4.0–10.5)
nRBC: 0 % (ref 0.0–0.2)

## 2023-09-10 LAB — RESP PANEL BY RT-PCR (RSV, FLU A&B, COVID)  RVPGX2
Influenza A by PCR: NEGATIVE
Influenza B by PCR: NEGATIVE
Resp Syncytial Virus by PCR: NEGATIVE
SARS Coronavirus 2 by RT PCR: POSITIVE — AB

## 2023-09-10 LAB — PROCALCITONIN: Procalcitonin: 0.33 ng/mL

## 2023-09-10 LAB — LACTIC ACID, PLASMA: Lactic Acid, Venous: 1.1 mmol/L (ref 0.5–1.9)

## 2023-09-10 LAB — D-DIMER, QUANTITATIVE: D-Dimer, Quant: 4.32 ug{FEU}/mL — ABNORMAL HIGH (ref 0.00–0.50)

## 2023-09-10 LAB — TROPONIN I (HIGH SENSITIVITY): Troponin I (High Sensitivity): 30 ng/L — ABNORMAL HIGH (ref <18)

## 2023-09-10 LAB — CK: Total CK: 2978 U/L — ABNORMAL HIGH (ref 38–234)

## 2023-09-10 LAB — CBG MONITORING, ED
Glucose-Capillary: 112 mg/dL — ABNORMAL HIGH (ref 70–99)
Glucose-Capillary: 110 mg/dL — ABNORMAL HIGH (ref 70–99)

## 2023-09-10 LAB — PHOSPHORUS: Phosphorus: 7.2 mg/dL — ABNORMAL HIGH (ref 2.5–4.6)

## 2023-09-10 MED ORDER — SODIUM CHLORIDE 0.9 % IV BOLUS
1000.0000 mL | Freq: Once | INTRAVENOUS | Status: AC
  Administered 2023-09-10: 1000 mL via INTRAVENOUS

## 2023-09-10 MED ORDER — ALBUTEROL SULFATE (2.5 MG/3ML) 0.083% IN NEBU: 2.5000 mg | INHALATION_SOLUTION | RESPIRATORY_TRACT | Status: DC | PRN

## 2023-09-10 MED ORDER — SODIUM CHLORIDE 0.9 % IV SOLN
2.0000 g | INTRAVENOUS | Status: DC
  Administered 2023-09-10 – 2023-09-12 (×3): 2 g via INTRAVENOUS
  Filled 2023-09-10 (×3): qty 20

## 2023-09-10 MED ORDER — POLYVINYL ALCOHOL 1.4 % OP SOLN: 1.0000 [drp] | Freq: Three times a day (TID) | OPHTHALMIC | Status: DC | PRN

## 2023-09-10 MED ORDER — ACETAMINOPHEN 325 MG PO TABS
650.0000 mg | ORAL_TABLET | Freq: Four times a day (QID) | ORAL | Status: DC | PRN
  Filled 2023-09-10: qty 2

## 2023-09-10 MED ORDER — CALCIUM ACETATE (PHOS BINDER) 667 MG PO CAPS
1334.0000 mg | ORAL_CAPSULE | Freq: Three times a day (TID) | ORAL | Status: DC
  Administered 2023-09-10 – 2023-09-11 (×3): 1334 mg via ORAL
  Filled 2023-09-10 (×6): qty 2

## 2023-09-10 MED ORDER — OXYCODONE HCL 5 MG PO TABS
10.0000 mg | ORAL_TABLET | Freq: Four times a day (QID) | ORAL | Status: DC | PRN
  Administered 2023-09-11 – 2023-09-12 (×3): 10 mg via ORAL
  Filled 2023-09-10 (×4): qty 2

## 2023-09-10 MED ORDER — ACETAMINOPHEN 650 MG RE SUPP: 650.0000 mg | Freq: Four times a day (QID) | RECTAL | Status: DC | PRN

## 2023-09-10 MED ORDER — ROPINIROLE HCL 1 MG PO TABS: 1.0000 mg | ORAL_TABLET | Freq: Three times a day (TID) | ORAL | Status: DC

## 2023-09-10 MED ORDER — SODIUM CHLORIDE 0.9 % IV SOLN: INTRAVENOUS | Status: DC

## 2023-09-10 MED ORDER — FENTANYL 12 MCG/HR TD PT72
1.0000 | MEDICATED_PATCH | TRANSDERMAL | Status: DC
  Administered 2023-09-10 – 2023-09-13 (×2): 1 via TRANSDERMAL
  Filled 2023-09-10: qty 1

## 2023-09-10 MED ORDER — SODIUM CHLORIDE 0.9 % IV SOLN
500.0000 mg | INTRAVENOUS | Status: DC
  Administered 2023-09-10 – 2023-09-12 (×3): 500 mg via INTRAVENOUS
  Filled 2023-09-10 (×4): qty 5

## 2023-09-10 MED ORDER — FENTANYL 25 MCG/HR TD PT72
1.0000 | MEDICATED_PATCH | TRANSDERMAL | Status: DC
  Administered 2023-09-10 – 2023-09-13 (×2): 1 via TRANSDERMAL
  Filled 2023-09-10 (×2): qty 1

## 2023-09-10 MED ORDER — ONDANSETRON HCL 4 MG PO TABS: 4.0000 mg | ORAL_TABLET | Freq: Four times a day (QID) | ORAL | Status: DC | PRN

## 2023-09-10 MED ORDER — ROPINIROLE HCL 1 MG PO TABS
1.0000 mg | ORAL_TABLET | Freq: Every day | ORAL | Status: DC
  Administered 2023-09-10: 1 mg via ORAL
  Filled 2023-09-10: qty 1

## 2023-09-10 MED ORDER — VITAMIN D (ERGOCALCIFEROL) 1.25 MG (50000 UNIT) PO CAPS: 50000.0000 [IU] | ORAL_CAPSULE | ORAL | Status: DC

## 2023-09-10 MED ORDER — ALPRAZOLAM 0.25 MG PO TABS
0.2500 mg | ORAL_TABLET | Freq: Three times a day (TID) | ORAL | Status: DC | PRN
  Administered 2023-09-11 – 2023-09-13 (×4): 0.25 mg via ORAL
  Filled 2023-09-10 (×4): qty 1

## 2023-09-10 MED ORDER — INSULIN ASPART 100 UNIT/ML IJ SOLN
0.0000 [IU] | Freq: Three times a day (TID) | INTRAMUSCULAR | Status: DC
  Administered 2023-09-11: 1 [IU] via SUBCUTANEOUS
  Filled 2023-09-10: qty 0.09

## 2023-09-10 MED ORDER — ONDANSETRON HCL 4 MG/2ML IJ SOLN
4.0000 mg | Freq: Four times a day (QID) | INTRAMUSCULAR | Status: DC | PRN
Start: 2023-09-10 — End: 2023-09-14
  Administered 2023-09-12 – 2023-09-13 (×2): 4 mg via INTRAVENOUS
  Filled 2023-09-10 (×2): qty 2

## 2023-09-10 MED ORDER — GABAPENTIN 300 MG PO CAPS
300.0000 mg | ORAL_CAPSULE | Freq: Every day | ORAL | Status: DC
  Administered 2023-09-10: 300 mg via ORAL
  Filled 2023-09-10: qty 1

## 2023-09-10 MED ORDER — ASPIRIN 81 MG PO TBEC
81.0000 mg | DELAYED_RELEASE_TABLET | Freq: Every day | ORAL | Status: DC
  Administered 2023-09-10 – 2023-09-11 (×2): 81 mg via ORAL
  Filled 2023-09-10 (×2): qty 1

## 2023-09-10 MED ORDER — HYDROMORPHONE HCL 1 MG/ML IJ SOLN
1.0000 mg | Freq: Once | INTRAMUSCULAR | Status: AC | PRN
  Administered 2023-09-10: 1 mg via INTRAVENOUS
  Filled 2023-09-10: qty 1

## 2023-09-10 MED ORDER — HEPARIN SODIUM (PORCINE) 5000 UNIT/ML IJ SOLN
5000.0000 [IU] | Freq: Three times a day (TID) | INTRAMUSCULAR | Status: DC
  Administered 2023-09-10 – 2023-09-13 (×8): 5000 [IU] via SUBCUTANEOUS
  Filled 2023-09-10 (×9): qty 1

## 2023-09-10 MED ORDER — POLYETHYLENE GLYCOL 3350 17 G PO PACK: 17.0000 g | PACK | Freq: Every day | ORAL | Status: DC | PRN

## 2023-09-10 MED ORDER — DEXAMETHASONE SODIUM PHOSPHATE 10 MG/ML IJ SOLN
6.0000 mg | INTRAMUSCULAR | Status: DC
  Administered 2023-09-10 – 2023-09-12 (×3): 6 mg via INTRAVENOUS
  Filled 2023-09-10 (×3): qty 1

## 2023-09-10 MED ORDER — TRAZODONE HCL 50 MG PO TABS
150.0000 mg | ORAL_TABLET | Freq: Every day | ORAL | Status: DC
  Administered 2023-09-10: 150 mg via ORAL
  Filled 2023-09-10: qty 2

## 2023-09-10 MED ORDER — IPRATROPIUM-ALBUTEROL 0.5-2.5 (3) MG/3ML IN SOLN
3.0000 mL | Freq: Three times a day (TID) | RESPIRATORY_TRACT | Status: DC
  Administered 2023-09-10 – 2023-09-11 (×3): 3 mL via RESPIRATORY_TRACT
  Filled 2023-09-10 (×3): qty 3

## 2023-09-10 NOTE — H&P (Signed)
 History and Physical    Patient: [name] HQI:[id number] DOB: February 17, 1935 DOA: 09/10/2023 DOS: the patient was seen and examined on 09/10/2023 PCP: Bertha Broad, MD  Patient coming from: Home  Chief Complaint:  Chief Complaint  Patient presents with   Fatigue   HPI: [name] is a 89 y.o. female with medical history significant of right aneurysm, history of CAD, anginal pain, anxiety, osteoarthritis, GERD, hiatal hernia, type 2 diabetes, hyperlipidemia, hypertension, multiple thyroid  nodules, postherpetic neuralgia, osteoporosis, restless legs syndrome, chronic back pain, status post lumbar laminectomy and cervical spinal fusion, COVID-19 disease, stage IV lung cancer with bone metastasis who presented to the emergency department with progressively worse dyspnea, nonproductive cough, low-grade temperature, chills, fatigue, malaise, myalgias, significantly decreased appetite, mild nausea and multiple episodes of diarrhea for the past 6 days.  She was diagnosed with COVID-19 and has been on 4 days of molnupiravir  without any improvement of the symptoms.  No constipation, melena or hematochezia. Her urinary output is decreased, but denied flank pain, dysuria, hematuria or frequency.  wheezing or hemoptysis.  No chest pain, palpitations, diaphoresis, PND, orthopnea or pitting edema of the lower extremities.   No polyuria, polydipsia, polyphagia or blurred vision.  Lab work: Urinalysis was cloudy with moderate hemoglobin and protein of 100 mg/dL.  There was rare bacteria on microscopic examination.  CBC showed a white count of 7.3 with 88% neutrophils, hemoglobin 9.2 g/dL and platelets 132.  D-dimer 4.32.  Troponin was 30 ng/L.  BMP 191.3 pg/mL.  Coronavirus PCR was positive, negative influenza A/B and RSV PCR.  Normal lactic acid level.  CMP showed a sodium of 133 and CO2 of 16 mmol/L with an anion gap of 13.  BUN was 60 a, creatinine 6.93 and corrected calcium  8.4 mg/dL.  AST  71 units/L, total protein 6.4 and albumin 2.9 g/dL.  Normal potassium, chloride, glucose, ALT, alkaline phosphatase and total bilirubin.  Total CK was 2978 units/L.  Magnesium was 3.2 and phosphorus 7.2 mg/dL.  Imaging: Portable 1 view chest radiograph showing mild bibasilar atelectasis.  1.5 cm diameter mildly opaque area overlying the right lung base which may represent a nipple shadow.  Correlation with follow-up chest plain film with nipple markers is recommended to exclude presence of pulmonary nodule.  ED course: Initial vital signs were temperature 97.2 F, pulse 68, respiration 20, BP 127/54 mmHg O2 sat 96% on nasal cannula oxygen at 2 LPM.  The patient was placed on normal saline at 75 mL/h.  I added NS 2000 mL liter bolus and hydromorphone  1 mg IVP.   Review of Systems: As mentioned in the history of present illness. All other systems reviewed and are negative.  Past Medical History:  Diagnosis Date   Aneurysm (HCC)    Right eye a non DES stent was placed so that she would not required long term dual antiplatelet therapy.   Anginal pain (HCC)    Anxiety    not currently taking any meds   Arthritis    CAD (coronary artery disease)    Diabetes mellitus without complication (HCC)    H/O hiatal hernia    Heart murmur    History of stress test 03/31/2012   The post stress myocardial perfusion images show a normal pattern of perfusion in all region. The post left ventricles is normal in size. There is no scintigraphic of inductible myocardial ischemia. The post EF is 73   Hx of echocardiogram 11/29/2010   Ef 67% Normal size chambes, Aortic  valve sclerosis without stenosis, No other significant valvular abnormalities, No percardial effusion.   Hyperlipemia    Hypertension    Insulin  resistance    Lung cancer (HCC)    Multiple thyroid  nodules    Neuromuscular disorder (HCC)    nerve pain after shingles   Osteoporosis    Restless legs    Shingles    Past Surgical History:   Procedure Laterality Date   ABDOMINAL HYSTERECTOMY  1970   ANGIOPLASTY     Stenting of a 90% eccentric right coronary artery stenosis and had a 3.0x15 mm Integrity bare-metal stent inserted   ANTERIOR CERVICAL DECOMP/DISCECTOMY FUSION N/A 07/30/2013   Procedure: ANTERIOR CERVICAL DECOMPRESSION/DISCECTOMY FUSION CERVICAL FIVE -SIX;  Surgeon: Isadora Mar, MD;  Location: [facility];  Service: Neurosurgery;  Laterality: N/A;   APPENDECTOMY     BREAST SURGERY Left    cysts removed from left breast (in her 20'2)   CARDIAC CATHETERIZATION     Showed a widely patent stent, she did have 60% ostial diagonal-1 stenosis, She also had mild luminal irregularities of her LAD.   COLONOSCOPY     EYE SURGERY Bilateral 2009   cateract surgery- bilateral   IR IMAGING GUIDED PORT INSERTION  06/04/2019   LAMINECTOMY WITH POSTERIOR LATERAL ARTHRODESIS LEVEL 2 Right 07/18/2021   Procedure: Laminectomy and Foraminotomy - Lumbar four-Lumbar five - Lumbar five-Sacral one- right, posterolateral fusion Lumbar four-five with interspinous plate;  Surgeon: Isadora Mar, MD;  Location: [facility] OR;  Service: Neurosurgery;  Laterality: Right;   LEFT HEART CATH AND CORONARY ANGIOGRAPHY N/A 11/15/2021   Procedure: LEFT HEART CATH AND CORONARY ANGIOGRAPHY;  Surgeon: Millicent Ally, MD;  Location: [facility];  Service: Cardiovascular;  Laterality: N/A;   REFRACTIVE SURGERY Left 2011   TONSILLECTOMY     Social History:  reports that she has quit smoking. Her smoking use included cigarettes. She has a 66 pack-year smoking history. She has never used smokeless tobacco. She reports current alcohol  use. She reports that she does not use drugs.  Allergies  Allergen Reactions   Codeine Nausea And Vomiting and Other (See Comments)    Severe constipation.    Family History  Problem Relation Age of Onset   Alzheimer's disease Mother 43   Colon polyps Mother    Diabetes Mother    Heart attack Father 33   Cancer Maternal  Grandmother 45   Breast cancer Maternal Grandmother    Diabetes Sister    Breast cancer Maternal Uncle    Breast cancer Maternal Aunt        x 2   Colon polyps Sister    Colon polyps Maternal Aunt    Colon cancer Neg Hx    Esophageal cancer Neg Hx    Rectal cancer Neg Hx    Stomach cancer Neg Hx     Prior to Admission medications   Medication Sig Start Date End Date Taking? Authorizing Provider  acetaminophen  (TYLENOL ) 500 MG tablet Take 2 tablets (1,000 mg total) by mouth every 8 (eight) hours as needed. 04/07/23  Yes Etter Hermann., MD  ALPRAZolam  (XANAX ) 0.25 MG tablet Take 0.25 mg by mouth 3 (three) times daily as needed for anxiety.   Yes [provider]  aspirin  EC 81 MG tablet Take 1 tablet (81 mg total) by mouth daily. Swallow whole. 11/01/21  Yes Millicent Ally, MD  fentaNYL  (DURAGESIC ) 12 MCG/HR Duragesic  patch 25 mcg + 12 mcg total dose 37 mcg every  3 days 09/07/23  Yes Fargo, Amy E, NP  fentaNYL  (DURAGESIC ) 25 MCG/HR Place 1 patch onto the skin every 3 (three) days. Plus 12 mcg patch total dose 37 mcg every 3 days 09/07/23  Yes Fargo, Amy E, NP  gabapentin  (NEURONTIN ) 300 MG capsule Take 300 mg by mouth at bedtime.    Yes [provider]  loperamide  (IMODIUM ) 2 MG capsule Take 2-4 mg by mouth as needed for diarrhea or loose stools.   Yes [provider]  metFORMIN  (GLUCOPHAGE ) 500 MG tablet Take 500 mg by mouth at bedtime. 12/18/20  Yes [provider]  methylPREDNISolone  (MEDROL ) 4 MG tablet Take 4 mg by mouth daily. Take 1/2 tablet by mouth daily   Yes [provider]  molnupiravir  EUA (LAGEVRIO ) 200 mg CAPS capsule Take 4 capsules (800 mg total) by mouth 2 (two) times daily for 5 days. 09/06/23 09/11/23 Yes Medina-Vargas, Monina C, NP  Multiple Vitamins-Minerals (CENTRUM SILVER PO) Take 1 tablet by mouth daily.   Yes [provider]  nitroGLYCERIN  (NITROSTAT ) 0.4 MG SL tablet Place 0.4 mg under the tongue every 5 (five)  minutes as needed for chest pain.   Yes [provider]  ondansetron  (ZOFRAN ) 8 MG tablet Take 8 mg by mouth every 8 (eight) hours as needed for nausea or vomiting.   Yes [provider]  Oxycodone  HCl 10 MG TABS Take 1 tablet (10 mg total) by mouth every 6 (six) hours as needed. 08/23/23  Yes Thomas, Lisa K, NP  polyethylene glycol (MIRALAX  / GLYCOLAX ) packet Take 17 g by mouth daily.   Yes [provider]  polyvinyl alcohol  (LIQUIFILM TEARS) 1.4 % ophthalmic solution Place 1 drop into both eyes every 8 (eight) hours as needed for dry eyes.   Yes [provider]  rOPINIRole  (REQUIP ) 1 MG tablet Take 1 mg by mouth 3 (three) times daily.   Yes [provider]  rosuvastatin  (CRESTOR ) 40 MG tablet Take 40 mg by mouth daily. 04/19/23  Yes [provider]  traZODone  (DESYREL ) 150 MG tablet Take 1 tablet (150 mg total) by mouth at bedtime. 04/07/23  Yes Etter Hermann., MD  Vitamin D , Ergocalciferol , (DRISDOL ) 50000 UNITS CAPS capsule Take 50,000 Units by mouth every Sunday.   Yes [provider]  zinc gluconate 50 MG tablet Take 50 mg by mouth at bedtime.   Yes [provider]  carboxymethylcellulose (REFRESH PLUS) 0.5 % SOLN Place 1 drop into both eyes 3 (three) times daily as needed (dry eyes). Patient not taking: Reported on 09/10/2023    [provider]  furosemide  (LASIX ) 20 MG tablet Take 20 mg by mouth daily. one time a day every other day for edema 08/02/23   [provider]  Magnesium 400 MG CAPS Take 250 mg by mouth as needed. Takes with pain med Patient not taking: Reported on 09/06/2023    [provider]    Physical Exam: Vitals:   09/10/23 0110 09/10/23 0530 09/10/23 0645 09/10/23 0655  BP:  134/61 (!) 108/48   Pulse: 68 (!) 58 (!) 55   Resp: 20 16 14 14   Temp:  98.1 F (36.7 C)    TempSrc:      SpO2: 96% 95% 95%    Physical Exam Vitals and nursing note reviewed.  Constitutional:       General: She is awake. She is not in acute distress.    Appearance: Normal appearance. She is obese. She is ill-appearing. She is  not diaphoretic.     Interventions: Nasal cannula in place.  HENT:     Head: Normocephalic.     Nose: No rhinorrhea.     Mouth/Throat:     Mouth: Mucous membranes are dry.  Eyes:     General: No scleral icterus.    Pupils: Pupils are equal, round, and reactive to light.  Neck:     Vascular: No JVD.  Cardiovascular:     Rate and Rhythm: Normal rate and regular rhythm.     Heart sounds: S1 normal and S2 normal.  Pulmonary:     Effort: Pulmonary effort is normal.     Breath sounds: Rales present. No wheezing or rhonchi.  Abdominal:     General: Bowel sounds are normal. There is no distension.     Palpations: Abdomen is soft.     Tenderness: There is no abdominal tenderness. There is no right CVA tenderness or left CVA tenderness.  Musculoskeletal:     Cervical back: Neck supple.     Right lower leg: No edema.     Left lower leg: No edema.  Skin:    General: Skin is warm and dry.     Comments: Decreased skin turgor.  Neurological:     General: No focal deficit present.     Mental Status: She is alert and oriented to person, place, and time.  Psychiatric:        Mood and Affect: Mood normal.        Behavior: Behavior normal. Behavior is cooperative.     Data Reviewed:  Results are pending, will review when available. 05/14/2023 echocardiogram report. IMPRESSIONS:   1. Left ventricular ejection fraction, by estimation, is 60 to 65%. The  left ventricle has normal function. The left ventricle has no regional  wall motion abnormalities. Left ventricular diastolic parameters were  normal.   2. Right ventricular systolic function is normal. The right ventricular  size is normal.   3. The mitral valve is normal in structure. Mild mitral valve  regurgitation.   4. The aortic valve is tricuspid. Aortic valve regurgitation is not  visualized.  Aortic valve sclerosis is present, with no evidence of aortic  valve stenosis.   5. The inferior vena cava is normal in size with greater than 50%  respiratory variability, suggesting right atrial pressure of 3 mmHg.   EKG: Vent. rate 58 BPM PR interval 265 ms QRS duration 91 ms QT/QTcB 460/452 ms P-R-T axes 45 -7 64 Sinus rhythm Atrial premature complex Prolonged PR interval Low voltage, precordial leads  Assessment and Plan: Principal Problem:   Acute respiratory failure with hypoxia (HCC) In the setting of:   COVID-19 virus infection Superimposed on:   Atelectasis and lung cancer Admit to telemetry/inpatient. Supplemental oxygen as needed. DuoNebs 3 times daily. Bronchodilators as needed. Incentive spirometry while awake. Flutter valve exercises. Antitussives as needed. Begin dexamethasone  6 mg IVP daily. Stop molnupiravir  p.o. twice daily. Begin coverage with: -Azithromycin  500 mg IVPB daily. -Ceftriaxone  1 g IVPB daily. -Add on procalcitonin. -Check procalcitonin in AM. Follow-up CBC, CMP and chemistry.  Active Problems:   AKI (acute kidney injury) (HCC) Secondary to:     Rhabdomyolysis And:   Dehydration  Induced by:   Diarrhea due to COVID-19  NS 2000 mL liter bolus. Continue IV fluids. Hold metformin  Hold diuretic. Hold statin. Avoid hypotension. Avoid nephrotoxins as possible.. Monitor intake and output. Monitor renal function electrolytes.    Hypocarbia In the setting of:   Compensated metabolic  acidosis Secondary to azotemia in the setting of AKI. Continue AKI treatment as above.    Hyponatremia Secondary to GI losses. Continue normal saline infusion. Follow-up sodium level.    Hypermagnesemia Due to AKI. Continue IV fluids. Follow-up magnesium level as needed.    Hyperphosphatemia Due to ARF. Shorten calcium  acetate. Continue IV fluids. Follow-up phosphorus level in AM.    Hypocalcemia Recheck calcium  and albumin level in  AM. Further workup depending on results.    Elevated troponin No chest pain. This is secondary to decreased renal clearance. No need to trend level.    Elevated brain natriuretic peptide (BNP) level Secondary to decreased renal perfusion. The patient is volume depleted.    Moderate protein malnutrition (HCC) In the setting of anemia and malignancy. May benefit from protein supplementation. Consider nutritional services evaluation. Follow-up albumin level.    Normocytic anemia Monitor hematocrit and hemoglobin. Transfuse as needed if hemoglobin drops below 8.0 g/dL.    Thrombocytopenia (HCC) Monitor platelet count.    Positive D dimer  Dyspnea likely from COVID-19. Unable to do CTA due to GFR. VQ scan with limited value in the setting of COVID-19/lung cancer. The patient is also thrombocytopenic. Will defer full dose anticoagulation due to: -Inability to accurately diagnose. -Symptoms/findings explained by other conditions. -D-dimer elevation might be due to present illnesses. -Anemia and thrombocytopenia. -Will do DVT prophylaxis with heparin  5K units SQ 3 times daily. Discussed with the patient's oncologist Dr. Scherrie Curt.    Lung cancer, primary, with metastasis from lung to other site, left [facility])   Metastasis to bone (HCC)   S/P cervical spinal fusion   S/P lumbar laminectomy Continue Duragesic  patch 25 mcg every 72 hours. Continue Duragesic  patch 12 mcg every 72 hours. Continue oxycodone  10 mg every 6 hours as needed. Continue gabapentin  300 mg p.o. at bedtime. As needed extra dose analgesic.    Restless leg syndrome Continue Mirapex 1 mg p.o. 3 times daily. -Pharmacy may need to adjust dosage as needed.    Coronary artery disease with exertional angina (HCC) Continue EC aspirin  81 mg p.o. daily. Holding statin due to rhabdomyolysis.    Essential hypertension Holding as needed furosemide . Monitor blood pressure.    Hyperlipidemia with target LDL less than  70 Hold rosuvastatin  in the setting of rhabdomyolysis..    Type 2 diabetes mellitus (HCC) Carbohydrate modified diet. Holding metformin . CBG monitoring with RI SS while on glucocorticoids.. Hemoglobin A1c was 7.0% 5 months ago.    Advance Care Planning:   Code Status: Do not attempt resuscitation (DNR) PRE-ARREST INTERVENTIONS DESIRED   Consults:   Family Communication:   Severity of Illness: The appropriate patient status for this patient is INPATIENT. Inpatient status is judged to be reasonable and necessary in order to provide the required intensity of service to ensure the patient's safety. The patient's presenting symptoms, physical exam findings, and initial radiographic and laboratory data in the context of their chronic comorbidities is felt to place them at high risk for further clinical deterioration. Furthermore, it is not anticipated that the patient will be medically stable for discharge from the hospital within 2 midnights of admission.   * I certify that at the point of admission it is my clinical judgment that the patient will require inpatient hospital care spanning beyond 2 midnights from the point of admission due to high intensity of service, high risk for further deterioration and high frequency of surveillance required.*  Author: Danice Dural, MD 09/10/2023 7:56 AM  For on call  review [email or web address].   This document was prepared using Dragon voice recognition software and may contain some unintended transcription errors.

## 2023-09-10 NOTE — ED Notes (Addendum)
 Pt is resting in bed. Daughter at bedside did not want to wake pt to give her aspirin  at this time since she just had got comfortable and fell asleep.

## 2023-09-10 NOTE — Telephone Encounter (Signed)
 Call from daughter, Moira Andrews to report [name] was diagnosed with COVID on 2/06 and has steadily declined. Went to ER at [facility] last night and she is being treated for acute renal failure, dehydration and UTI. Awaiting an inpatient bed to open up.

## 2023-09-10 NOTE — Progress Notes (Signed)
 [facility] admitting physician addendum:  After discussing the case with Dr. Scherrie Curt.  He recommended to obtain a noncontrast chest CT.  If hypoxia cannot be explained by findings anticoagulation might be a reasonable option.  She has been getting treatment with pembrolizumab , but Dr. Scherrie Curt thinks that it would be unusual for her to get pneumonitis or AKI this late into treatment.  CT chest without contrast ordered.  Renal ultrasound showed mildly increased renal echogenicity bilaterally consistent with medical renal disease, but no hydronephrosis.  Lucienne Ryder, MD.

## 2023-09-10 NOTE — ED Triage Notes (Signed)
 Patient arrived from [facility], patient has been Covid-19 positive for the week. Patient normally on RA, given 2L O2 en route due to saturation 88% on EMS arrival. Per facility patient less active than her baseline.

## 2023-09-10 NOTE — ED Provider Notes (Signed)
 [place] EMERGENCY DEPARTMENT AT [facility] Provider Note   CSN: [id number] Arrival date & time: 09/10/23  0054     History  Chief Complaint  Patient presents with   Fatigue    [name] is a 89 y.o. female.  The history is provided by the patient, the nursing home and the EMS personnel.   she has history of hypertension, diabetes, lung cancer and was transferred here from skilled nursing facility because of decreased level of activity.  She had tested positive for COVID-19 this past week.  EMS noted hypoxia with oxygen saturation of 88% and she was placed on oxygen at 2 L/min.  Patient states that she started feeling sick tonight.  She endorses slight cough and generalized weakness.  She denies fever or chills.  She has had nausea but no vomiting.  She denies any diarrhea.   Home Medications Prior to Admission medications   Medication Sig Start Date End Date Taking? Authorizing Provider  acetaminophen  (TYLENOL ) 500 MG tablet Take 2 tablets (1,000 mg total) by mouth every 8 (eight) hours as needed. 04/07/23   Etter Hermann., MD  ALPRAZolam  (XANAX ) 0.25 MG tablet Take 0.25 mg by mouth 3 (three) times daily as needed for anxiety.    [provider]  aspirin  EC 81 MG tablet Take 1 tablet (81 mg total) by mouth daily. Swallow whole. 11/01/21   Millicent Ally, MD  carboxymethylcellulose (REFRESH PLUS) 0.5 % SOLN Place 1 drop into both eyes 3 (three) times daily as needed (dry eyes).    [provider]  fentaNYL  (DURAGESIC ) 12 MCG/HR Duragesic  patch 25 mcg + 12 mcg total dose 37 mcg every 3 days 09/07/23   Celina Colla, Amy E, NP  fentaNYL  (DURAGESIC ) 25 MCG/HR Place 1 patch onto the skin every 3 (three) days. Plus 12 mcg patch total dose 37 mcg every 3 days 09/07/23   Ulyses Gandy E, NP  furosemide  (LASIX ) 20 MG tablet Take 20 mg by mouth daily. one time a day every other day for edema 08/02/23   [provider]  gabapentin  (NEURONTIN ) 300 MG  capsule Take 300 mg by mouth at bedtime.     [provider]  loperamide  (IMODIUM ) 2 MG capsule Take 2-4 mg by mouth as needed for diarrhea or loose stools.    [provider]  Magnesium 400 MG CAPS Take 250 mg by mouth as needed. Takes with pain med Patient not taking: Reported on 09/06/2023    [provider]  metFORMIN  (GLUCOPHAGE ) 500 MG tablet Take 500 mg by mouth at bedtime. 12/18/20   [provider]  methylPREDNISolone  (MEDROL ) 4 MG tablet Take 4 mg by mouth daily. Take 1/2 tablet by mouth daily    [provider]  molnupiravir  EUA (LAGEVRIO ) 200 mg CAPS capsule Take 4 capsules (800 mg total) by mouth 2 (two) times daily for 5 days. Patient not taking: Reported on 09/06/2023 09/06/23 09/11/23  Medina-Vargas, Monina C, NP  Multiple Vitamins-Minerals (CENTRUM SILVER PO) Take 1 tablet by mouth daily.    [provider]  nitroGLYCERIN  (NITROSTAT ) 0.4 MG SL tablet Place 0.4 mg under the tongue every 5 (five) minutes as needed for chest pain.    [provider]  ondansetron  (ZOFRAN ) 8 MG tablet Take 8 mg by mouth every 8 (eight) hours as needed for nausea or vomiting.    [provider]  Oxycodone  HCl 10 MG TABS Take 1 tablet (10 mg total) by mouth every 6 (  six) hours as needed. 08/23/23   Thomas, Lisa K, NP  polyethylene glycol (MIRALAX  / GLYCOLAX ) packet Take 17 g by mouth daily.    [provider]  polyvinyl alcohol  (LIQUIFILM TEARS) 1.4 % ophthalmic solution Place 1 drop into both eyes every 8 (eight) hours as needed for dry eyes.    [provider]  rOPINIRole  (REQUIP ) 1 MG tablet Take 1 mg by mouth 3 (three) times daily.    [provider]  rosuvastatin  (CRESTOR ) 40 MG tablet Take 40 mg by mouth daily. 04/19/23   [provider]  traZODone  (DESYREL ) 150 MG tablet Take 1 tablet (150 mg total) by mouth at bedtime. 04/07/23   Etter Hermann., MD  Vitamin D , Ergocalciferol , (DRISDOL ) 50000  UNITS CAPS capsule Take 50,000 Units by mouth every Sunday.    [provider]  zinc gluconate 50 MG tablet Take 50 mg by mouth at bedtime.    [provider]      Allergies    Codeine    Review of Systems   Review of Systems  All other systems reviewed and are negative.   Physical Exam Updated Vital Signs There were no vitals taken for this visit. Physical Exam Vitals and nursing note reviewed.   89 year old female, resting comfortably and in no acute distress. Vital signs are normal. Oxygen saturation is 96%, which is normal. Head is normocephalic and atraumatic. PERRLA, EOMI. Oropharynx is clear. Neck is nontender and supple without adenopathy. Lungs are clear without rales, wheezes, or rhonchi. Chest is nontender.  Mediport is present on the right. Heart has regular rate and rhythm without murmur. Abdomen is soft, flat, nontender. Extremities have 1+ edema, full range of motion is present. Skin is warm and dry without rash. Neurologic: Sleepy but arousable, cranial nerves are intact, moves all extremities equally.  ED Results / Procedures / Treatments   Labs (all labs ordered are listed, but only abnormal results are displayed) Labs Reviewed  RESP PANEL BY RT-PCR (RSV, FLU A&B, COVID)  RVPGX2 - Abnormal; Notable for the following components:      Result Value   SARS Coronavirus 2 by RT PCR POSITIVE (*)    All other components within normal limits  COMPREHENSIVE METABOLIC PANEL - Abnormal; Notable for the following components:   Sodium 133 (*)    CO2 16 (*)    BUN 68 (*)    Creatinine, Ser 6.93 (*)    Calcium  7.4 (*)    Total Protein 6.4 (*)    Albumin 2.9 (*)    AST 71 (*)    GFR, Estimated 5 (*)    All other components within normal limits  CBC WITH DIFFERENTIAL/PLATELET - Abnormal; Notable for the following components:   RBC 3.26 (*)    Hemoglobin 9.2 (*)    HCT 30.0 (*)    RDW 15.7 (*)    Platelets 115 (*)    Lymphs Abs 0.3 (*)    Abs  Immature Granulocytes 0.08 (*)    All other components within normal limits  BRAIN NATRIURETIC PEPTIDE - Abnormal; Notable for the following components:   B Natriuretic Peptide 191.3 (*)    All other components within normal limits  TROPONIN I (HIGH SENSITIVITY) - Abnormal; Notable for the following components:   Troponin I (High Sensitivity) 30 (*)    All other components within normal limits  URINALYSIS, W/ REFLEX TO CULTURE (INFECTION SUSPECTED)  D-DIMER, QUANTITATIVE  URINALYSIS, ROUTINE W REFLEX MICROSCOPIC  LACTIC  ACID, PLASMA  LACTIC ACID, PLASMA   Radiology DG Chest Port 1 View Result Date: 09/10/2023 CLINICAL DATA:  Recent history of COVID, presenting with hypoxia. EXAM: PORTABLE CHEST 1 VIEW COMPARISON:  October 20, 2021 FINDINGS: There is stable right-sided venous Port-A-Cath positioning. The heart size and mediastinal contours are within normal limits. A 1.5 cm diameter mildly opaque area is seen overlying the right lung base. The periphery of the right lung base. Mild areas of bibasilar atelectasis are also seen. No pleural effusion or pneumothorax is identified. A radiopaque fusion plate and screws are seen overlying the cervical spine. No acute osseous abnormalities are identified. IMPRESSION: 1. Mild bibasilar atelectasis. 2. 1.5 cm diameter mildly opaque area overlying the right lung base which may represent a nipple shadow. Correlation with follow-up chest plain film with nipple markers is recommended to exclude the presence of a pulmonary nodule. Electronically Signed   By: Virgle Grime M.D.   On: 09/10/2023 02:54    Procedures Procedures    Medications Ordered in ED Medications  0.9 %  sodium chloride  infusion (has no administration in time range)    ED Course/ Medical Decision Making/ A&P                                 Medical Decision Making Amount and/or Complexity of Data Reviewed Labs: ordered. Radiology: ordered.  Risk Decision regarding  hospitalization.   Hypoxia inpatient who is known to be COVID-positive.  This is possibly natural progression of COVID-19, consider superimposed pneumonia or other viral illness.  I have ordered laboratory workup, chest x-ray, electrocardiogram.  I have reviewed her past records, and I note office visit on 08/30/2023 for routine follow-up of lung cancer and plan to continue pembrolizumab  every 3 weeks and infusion given that day.  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  It is unclear at this point how much COVID-19 is related to her other symptoms and laboratory findings.  I chest x-ray shows bibasilar atelectasis but no definite pneumonia.  I have independently viewed the image, and agree with radiologist's interpretation.  I have reviewed her medications, and see nothing that is inherently nephrotoxic.  She will need to be admitted for evaluation of her acute renal failure.  I discussed case with Dr. Franklin Ito of [facility], who agrees to admit the patient.  Final Clinical Impression(s) / ED Diagnoses Final diagnoses:  Acute renal failure, unspecified acute renal failure type (HCC)  Hyponatremia  Elevated AST (SGOT)  Normochromic normocytic anemia  Thrombocytopenia (HCC)  COVID-19 virus infection    Rx / DC Orders ED Discharge Orders     None         Alissa April, MD 09/10/23 0710

## 2023-09-10 NOTE — ED Notes (Signed)
 Patient transported to CT

## 2023-09-11 ENCOUNTER — Other Ambulatory Visit: Payer: Self-pay

## 2023-09-11 DIAGNOSIS — J9601 Acute respiratory failure with hypoxia: Secondary | ICD-10-CM | POA: Diagnosis not present

## 2023-09-11 LAB — COMPREHENSIVE METABOLIC PANEL
ALT: 31 U/L (ref 0–44)
AST: 65 U/L — ABNORMAL HIGH (ref 15–41)
Albumin: 2.7 g/dL — ABNORMAL LOW (ref 3.5–5.0)
Alkaline Phosphatase: 65 U/L (ref 38–126)
Anion gap: 17 — ABNORMAL HIGH (ref 5–15)
BUN: 70 mg/dL — ABNORMAL HIGH (ref 8–23)
CO2: 13 mmol/L — ABNORMAL LOW (ref 22–32)
Calcium: 7.2 mg/dL — ABNORMAL LOW (ref 8.9–10.3)
Chloride: 103 mmol/L (ref 98–111)
Creatinine, Ser: 8.01 mg/dL — ABNORMAL HIGH (ref 0.44–1.00)
GFR, Estimated: 5 mL/min — ABNORMAL LOW (ref >60)
Glucose, Bld: 120 mg/dL — ABNORMAL HIGH (ref 70–99)
Potassium: 5.2 mmol/L — ABNORMAL HIGH (ref 3.5–5.1)
Sodium: 133 mmol/L — ABNORMAL LOW (ref 135–145)
Total Bilirubin: 0.6 mg/dL (ref 0.0–1.2)
Total Protein: 6.4 g/dL — ABNORMAL LOW (ref 6.5–8.1)

## 2023-09-11 LAB — CBC
HCT: 30.8 % — ABNORMAL LOW (ref 36.0–46.0)
Hemoglobin: 9.5 g/dL — ABNORMAL LOW (ref 12.0–15.0)
MCH: 28.3 pg (ref 26.0–34.0)
MCHC: 30.8 g/dL (ref 30.0–36.0)
MCV: 91.7 fL (ref 80.0–100.0)
Platelets: 133 10*3/uL — ABNORMAL LOW (ref 150–400)
RBC: 3.36 MIL/uL — ABNORMAL LOW (ref 3.87–5.11)
RDW: 15.8 % — ABNORMAL HIGH (ref 11.5–15.5)
WBC: 6.1 10*3/uL (ref 4.0–10.5)
nRBC: 0 % (ref 0.0–0.2)

## 2023-09-11 LAB — CREATININE, URINE, RANDOM: Creatinine, Urine: 63 mg/dL

## 2023-09-11 LAB — GLUCOSE, CAPILLARY
Glucose-Capillary: 140 mg/dL — ABNORMAL HIGH (ref 70–99)
Glucose-Capillary: 137 mg/dL — ABNORMAL HIGH (ref 70–99)

## 2023-09-11 LAB — SODIUM, URINE, RANDOM: Sodium, Ur: 30 mmol/L

## 2023-09-11 LAB — CBG MONITORING, ED
Glucose-Capillary: 113 mg/dL — ABNORMAL HIGH (ref 70–99)
Glucose-Capillary: 120 mg/dL — ABNORMAL HIGH (ref 70–99)

## 2023-09-11 LAB — STREP PNEUMONIAE URINARY ANTIGEN: Strep Pneumo Urinary Antigen: NEGATIVE

## 2023-09-11 LAB — PHOSPHORUS: Phosphorus: 8.4 mg/dL — ABNORMAL HIGH (ref 2.5–4.6)

## 2023-09-11 LAB — PROCALCITONIN: Procalcitonin: 0.58 ng/mL

## 2023-09-11 LAB — OSMOLALITY, URINE: Osmolality, Ur: 230 mosm/kg — ABNORMAL LOW (ref 300–900)

## 2023-09-11 LAB — CK: Total CK: 2433 U/L — ABNORMAL HIGH (ref 38–234)

## 2023-09-11 LAB — OSMOLALITY: Osmolality: 312 mosm/kg — ABNORMAL HIGH (ref 275–295)

## 2023-09-11 MED ORDER — CARMEX CLASSIC LIP BALM EX OINT
TOPICAL_OINTMENT | CUTANEOUS | Status: DC | PRN
  Administered 2023-09-11 – 2023-09-13 (×3): 1 via TOPICAL
  Filled 2023-09-11: qty 10

## 2023-09-11 MED ORDER — CHLORHEXIDINE GLUCONATE CLOTH 2 % EX PADS
6.0000 | MEDICATED_PAD | Freq: Every day | CUTANEOUS | Status: DC
  Administered 2023-09-11 – 2023-09-12 (×2): 6 via TOPICAL

## 2023-09-11 MED ORDER — STERILE WATER FOR INJECTION IV SOLN
INTRAVENOUS | Status: DC
  Filled 2023-09-11 (×5): qty 150

## 2023-09-11 MED ORDER — ORAL CARE MOUTH RINSE: 15.0000 mL | OROMUCOSAL | Status: DC | PRN

## 2023-09-11 NOTE — Progress Notes (Signed)
Pt had a 2.89 sinus pause. Notified via central tele. MD made aware. Since patient not having any symptoms at the moment, stated to continue to monitor.

## 2023-09-11 NOTE — TOC Initial Note (Signed)
Transition of Care [facility]) - Initial/Assessment Note    Patient Details  Name: [name] MRN: [id number] Date of Birth: [birth year]  [facility] CM/SW Contact:    Lanier Clam, RN Phone Number: 09/11/2023, 3:54 PM  Clinical Narrative: Confirmed from [facility] rep Clydie Braun states-indep w/adl's,& mobility.Left vm w/dtr [name] await call back.                  Expected Discharge Plan: Assisted Living Barriers to Discharge: Continued Medical Work up   Patient Goals and CMS Choice Patient states their goals for this hospitalization and ongoing recovery are:: Return ALF-[facility] CMS Medicare.gov Compare Post Acute Care list provided to:: Patient Represenative (must comment) Choice offered to / list presented to : Adult Children [place] ownership interest in [name].provided to:: Adult Children    Expected Discharge Plan and Services   Discharge Planning Services: CM Consult Post Acute Care Choice: Resumption of Svcs/PTA Provider Living arrangements for the past 2 months: Assisted Living Facility                                      Prior Living Arrangements/Services Living arrangements for the past 2 months: Assisted Living Facility Lives with:: Facility Resident              Current home services: DME (rw,motorized w/c.)    Activities of Daily Living   ADL Screening (condition at time of admission) Independently performs ADLs?: Yes (appropriate for developmental age) Is the patient deaf or have difficulty hearing?: No Does the patient have difficulty seeing, even when wearing glasses/contacts?: No Does the patient have difficulty concentrating, remembering, or making decisions?: No  Permission Sought/Granted                  Emotional Assessment              Admission diagnosis:  Hyponatremia [E87.1] Thrombocytopenia (HCC) [D69.6] Acute respiratory failure with hypoxia (HCC)  [J96.01] Normochromic normocytic anemia [D64.9] Elevated AST (SGOT) [R74.01] Acute renal failure, unspecified acute renal failure type (HCC) [N17.9] COVID-19 virus infection [U07.1] Patient Active Problem List   Diagnosis Date Noted   Acute respiratory failure with hypoxia (HCC) 09/10/2023   Hypermagnesemia 09/10/2023   Hyperphosphatemia 09/10/2023   Hyponatremia 09/10/2023   Moderate protein malnutrition (HCC) 09/10/2023   Normocytic anemia 09/10/2023   Thrombocytopenia (HCC) 09/10/2023   Elevated troponin 09/10/2023   Elevated brain natriuretic peptide (BNP) level 09/10/2023   Hypocarbia 09/10/2023   Hypocalcemia 09/10/2023   COVID-19 virus infection 09/10/2023   Compensated metabolic acidosis 09/10/2023   Atelectasis 09/10/2023   Rhabdomyolysis 09/10/2023   Dehydration 09/10/2023   Diarrhea due to COVID-19 09/10/2023   Positive D dimer 09/10/2023   AKI (acute kidney injury) (HCC) 03/31/2023   Metastasis to bone (HCC) 04/20/2022   S/P lumbar laminectomy 07/18/2021   Port-A-Cath in place 07/18/2019   Lung cancer, primary, with metastasis from lung to other site, left (HCC) 05/29/2019   Pain in joint of right hip 04/18/2018   Anxiety 06/30/2016   Insomnia 06/30/2016   Nasolacrimal duct obstruction, 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 [place] [address] Phone: [phone] Fax: [phone]  [id number] [place], [place] - [address] [place] [address] Phone: [phone] Fax: [phone]     Social Drivers of Health (SDOH) Social History: SDOH Screenings   Food Insecurity: No Food Insecurity (09/11/2023)  Housing: Low Risk  (09/11/2023)  Transportation Needs: No Transportation Needs (09/11/2023)  Utilities: Not At Risk (09/11/2023)  Social Connections: Unknown (09/11/2023)  Tobacco Use: Medium Risk (09/10/2023)   SDOH Interventions:     Readmission Risk Interventions     No data to display

## 2023-09-11 NOTE — Progress Notes (Signed)
       Overnight   NAME: [name] MRN: [id number] DOB : 11-12-1934    Date of Service   09/11/2023   HPI/Events of Note    Notified by RN for patient with temp of 94.49F.  Despite environmental attempts at warming, normothermia is unable to be achieved on this floor. Patient is moved to stepdown for active warming and closer monitoring of vitals during the warming process.  Currently assigned floor cannot use active warming devices.   Interventions/ Plan   Transfer to stepdown for active rewarming. Transfer to stepdown for increased vital monitoring during rewarming. Continue all previous attending orders        Update 2347 hrs. Current temperature 97.7 F oral. Rewarming continues.    [name] BSN MSNA MSN ACNPC-AG Acute Care Nurse Practitioner [facility]

## 2023-09-11 NOTE — Progress Notes (Signed)
MEWS Progress Note  Patient Details Name: [name] MRN: [id number] DOB: 1934/09/04 Today's Date: 09/11/2023   MEWS Flowsheet Documentation:  Assess: MEWS Score Temp: (!) 94.3 F (34.6 C) BP: (!) 116/51 MAP (mmHg): 68 Pulse Rate: 61 ECG Heart Rate: 61 Resp: 16 Level of Consciousness: Alert SpO2: 99 % O2 Device: Nasal Cannula Patient Activity (if Appropriate): In bed O2 Flow Rate (L/min): 3 L/min Assess: MEWS Score MEWS Temp: 2 MEWS Systolic: 0 MEWS Pulse: 0 MEWS RR: 0 MEWS LOC: 0 MEWS Score: 2 MEWS Score Color: Yellow Assess: SIRS CRITERIA SIRS Temperature : 1 SIRS Respirations : 0 SIRS Pulse: 0 SIRS WBC: 0 SIRS Score Sum : 1 SIRS Temperature : 1 SIRS Pulse: 0 SIRS Respirations : 0 SIRS WBC: 0 SIRS Score Sum : 1 Assess: if the MEWS score is Yellow or Red Were vital signs accurate and taken at a resting state?: Yes Does the patient meet 2 or more of the SIRS criteria?: No MEWS guidelines implemented : Yes, yellow Treat MEWS Interventions: Considered administering scheduled or prn medications/treatments as ordered Take Vital Signs Increase Vital Sign Frequency : Yellow: Q2hr x1, continue Q4hrs until patient remains green for 12hrs Escalate MEWS: Escalate: Yellow: Discuss with charge nurse and consider notifying provider and/or RRT Notify: Charge Nurse/RN Name of Charge Nurse/RN Notified: Tommy Medal, RN Provider Notification Provider Name/Title: Eric [place], DO Date Provider Notified: 09/11/23 Time Provider Notified: 1830 Method of Notification: Page Notification Reason: Other (Comment) (Pt temp) Provider response: No new orders      [place] R Karenna Romanoff 09/11/2023, 6:35 PM

## 2023-09-11 NOTE — Progress Notes (Signed)
PROGRESS NOTE    [name]  VWU:[id number] DOB: 1935-07-29 DOA: 09/10/2023 PCP: Garlan Fillers, MD    Brief Narrative:   [name] is a 89 y.o. female with past medical history significant for CAD, type 2 diabetes mellitus, HTN, HLD, RLS, chronic back pain s/p lumbar laminectomy and cervical spinal fusion, stage IV lung cancer with bone metastasis, anxiety, osteoarthritis, GERD, hiatal hernia who presented to [facility] ED on 09/10/2023 with progressive shortness of breath, nonproductive cough, fever, chills, fatigue, malaise, myalgias and decreased appetite over the last 6 days.  Recently diagnosed with with COVID-19 viral infection and has been on 4 days of molnupravir without any improvement of her symptoms.  Also endorses decreased urine output.  Denies constipation, no blood in stool, no flank pain, no dysuria, no blood in her urine, no wheezing or hemoptysis, no chest pain, no palpitations, no diaphoresis, no orthopnea/PND, no lower extremity edema.  In the ED, temperature 97.2 F, HR 68, RR 20, BP 127/54, SpO2 96% on 2 L nasal cannula.  WBC 7.3, hemoglobin 9.2, platelet count 115.  Sodium 133, potassium 4.3, chloride 104, CO2 16, BUN 68, creatinine 6.93, glucose 88.  AST 71, ALT 34, total bilirubin 0.6.  BNP 191.3.  Procalcitonin 0.33.  CK 2978.  COVID PCR positive.  Influenza A/B and RSV PCR negative.  Urinalysis unrevealing.  Chest x-ray with mild bibasilar atelectasis, 1.5 cm mildly opaque area overlying the right lung base which may represent a nipple shadow.  CT chest without contrast with interstitial airspace opacities within the right middle and lower lobes consistent with multifocal right-sided bronchopneumonia, grossly stable bilobed left lower lobe pulmonary mass consistent with known history of lung cancer, trace bilateral pleural effusions, aortic atherosclerosis.  Renal ultrasound with mildly increased renal echogenicity bilaterally consistent with  medical renal disease, no hydronephrosis.  Patient placed on normal saline at 75 mm/h, given additional 2 L NS bolus and Dilaudid 1 mg IVP.  TRH consulted for admission for further evaluation and management with concern for acute renal failure on CKD stage IIIb, bronchopneumonia.  Assessment & Plan:   Acute renal failure on CKD stage IIIb Metabolic acidosis Patient reports decreased urine output over the last few days.  Most recent creatinine 1.12 on 08/30/2023.  Creatinine on admission elevated 6.93.  Renal ultrasound with mildly increased renal echogenicity bilaterally consistent with medical renal disease, no hydronephrosis. -- Nephrology following, appreciate assistance -- Cr 6.93>8.01 -- Serum CO2 16>13 -- Sodium bicarbonate drip at 125 mL/h -- Foley catheter placed 2/11, strict I's and O's; close monitoring of urinary output -- Avoid nephrotoxins, renally dose all medications -- BMP daily  Multifocal bronchopneumonia CT chest without contrast with findings consistent with multifocal right-sided bronchopneumonia -- Azithromycin 5 mg IV q24h x 5 days -- Ceftriaxone 2 g IV every 24 hours x 5 days  Recent Covid-19 viral infection Recently diagnosed outpatient, started on molnupiravir without improvement of symptoms. -- Decadron 6 mg IV every 24 hours -- Airborne/contact isolation precautions  Rhabdomyolysis, mild CK elevated 2978 on admission. -- CK 2956>2130 -- continue IVF hydration as above -- repeat CK level in the am  Transaminitis LFTs slightly elevated on admission with AST 71, ALT 34. -- Holding home statin -- repeat CMP in am  Hyponatremia Sodium 133, likely secondary to hypervolemic hyponatremia in setting of volume overload given acute renal failure as above. -- BMP daily  HTN BP 116/86 this a.m., currently not on antihypertensives outpatient. -- continue to monitor BP  HLD --Holding home statin in the setting of slightly elevated LFTs on admission.  Type 2  diabetes mellitus At baseline on metformin 500 mg p.o. daily.  Hemoglobin A1c 7.0 on 04/04/2023, well-controlled. -- Holding home metformin -- Sensitive SSI for coverage -- CBGs qAC/HS  Anxiety -- Alprazolam 0.25 mg p.o. 3 times daily  Stage IV lung cancer with bony metastasis Pain of malignancy Chronic neck/back pain s/p lumbar laminectomy and cervical spinal fusion Follows with medical oncology outpatient, Dr. Marland Kitchen Fentanyl patch -- Oxycodone 10 mg p.o. every 6 hours as needed cancer pain -- Gabapentin 300 mg p.o. nightly  Restless leg syndrome -- Requip 1 mg p.o. 3 times daily    DVT prophylaxis: heparin injection 5,000 Units Start: 09/10/23 1400    Code Status: Do not attempt resuscitation (DNR) PRE-ARREST INTERVENTIONS DESIRED Family Communication:   Disposition Plan:  Level of care: Progressive Status is: Inpatient Remains inpatient appropriate because: IV fluid hydration, needs improvement of creatinine, may end up needing short-term HD for acute renal failure    Consultants:  Nephrology  Procedures:  Foley catheter placed 2/11  Antimicrobials:  Azithromycin 2/10>> Ceftriaxone 2/10>>   Subjective: Patient seen examined bedside, resting calmly.  Sitting in bedside chair and remains in ED holding area.  Reports has not urinated since presentation to the ED.  Feels like she is "holding it".  Discussed with RN present at bedside, will place Foley catheter to monitor urine output.  Patient with no other specific questions, concerns or complaints at this time.  Denies headache, no dizziness, no chest pain, no palpitations, no shortness of breath, no abdominal pain, no fever/chills/night sweats, no nausea/vomiting/diarrhea, no focal weakness, no paresthesia.  No acute events overnight per nurse staff.  Objective: Vitals:   09/11/23 1250 09/11/23 1402 09/11/23 1439 09/11/23 1454  BP: 120/88 (!) 110/48    Pulse: 90 (!) 53    Resp: 16 18    Temp: (!) 97.4 F (36.3 C)      TempSrc: Oral     SpO2: 100% 98% 92%   Weight:    83 kg  Height:    5\' 2"  (1.575 m)    Intake/Output Summary (Last 24 hours) at 09/11/2023 1518 Last data filed at 09/11/2023 1506 Gross per 24 hour  Intake 825.68 ml  Output --  Net 825.68 ml   Filed Weights   09/11/23 1454  Weight: 83 kg    Examination:  Physical Exam: GEN: NAD, alert and oriented x 3, elderly in appearance HEENT: NCAT, PERRL, EOMI, sclera clear, MMM PULM: CTAB w/o wheezes/crackles, normal respiratory effort, on room air with SpO2 94% at rest CV: RRR w/o M/G/R GI: abd soft, NTND, + BS MSK: no peripheral edema, moves all extremities independently NEURO: No focal neurological deficits PSYCH: normal mood/affect Integumentary: No concerning rashes/lesions/wounds noted on exposed skin surfaces    Data Reviewed: I have personally reviewed following labs and imaging studies  CBC: Recent Labs  Lab 09/10/23 0208 09/11/23 0613  WBC 7.3 6.1  NEUTROABS 6.5  --   HGB 9.2* 9.5*  HCT 30.0* 30.8*  MCV 92.0 91.7  PLT 115* 133*   Basic Metabolic Panel: Recent Labs  Lab 09/10/23 0208 09/11/23 0613  NA 133* 133*  K 4.3 5.2*  CL 104 103  CO2 16* 13*  GLUCOSE 88 120*  BUN 68* 70*  CREATININE 6.93* 8.01*  CALCIUM 7.4* 7.2*  MG 3.2*  --   PHOS 7.2* 8.4*   GFR: Estimated Creatinine Clearance: 5 mL/min (A) (  by C-G formula based on SCr of 8.01 mg/dL (H)). Liver Function Tests: Recent Labs  Lab 09/10/23 0208 09/11/23 0613  AST 71* 65*  ALT 34 31  ALKPHOS 62 65  BILITOT 0.6 0.6  PROT 6.4* 6.4*  ALBUMIN 2.9* 2.7*   No results for input(s): "LIPASE", "AMYLASE" in the last 168 hours. No results for input(s): "AMMONIA" in the last 168 hours. Coagulation Profile: No results for input(s): "INR", "PROTIME" in the last 168 hours. Cardiac Enzymes: Recent Labs  Lab 09/10/23 0208 09/11/23 0613  CKTOTAL 2,978* 2,433*   BNP (last 3 results) No results for input(s): "PROBNP" in the last 8760  hours. HbA1C: No results for input(s): "HGBA1C" in the last 72 hours. CBG: Recent Labs  Lab 09/10/23 1418 09/10/23 1811 09/11/23 0820 09/11/23 1146  GLUCAP 112* 110* 113* 120*   Lipid Profile: No results for input(s): "CHOL", "HDL", "LDLCALC", "TRIG", "CHOLHDL", "LDLDIRECT" in the last 72 hours. Thyroid Function Tests: No results for input(s): "TSH", "T4TOTAL", "FREET4", "T3FREE", "THYROIDAB" in the last 72 hours. Anemia Panel: No results for input(s): "VITAMINB12", "FOLATE", "FERRITIN", "TIBC", "IRON", "RETICCTPCT" in the last 72 hours. Sepsis Labs: Recent Labs  Lab 09/10/23 0208 09/10/23 0835 09/11/23 0613  PROCALCITON 0.33  --  0.58  LATICACIDVEN  --  1.1  --     Recent Results (from the past 240 hours)  Resp panel by RT-PCR (RSV, Flu A&B, Covid) Anterior Nasal Swab     Status: Abnormal   Collection Time: 09/10/23  2:08 AM   Specimen: Anterior Nasal Swab  Result Value Ref Range Status   SARS Coronavirus 2 by RT PCR POSITIVE (A) NEGATIVE Final    Comment: (NOTE) SARS-CoV-2 target nucleic acids are DETECTED.  The SARS-CoV-2 RNA is generally detectable in upper respiratory specimens during the acute phase of infection. Positive results are indicative of the presence of the identified virus, but do not rule out bacterial infection or co-infection with other pathogens not detected by the test. Clinical correlation with patient history and other diagnostic information is necessary to determine patient infection status. The expected result is Negative.  Fact Sheet for Patients: BloggerCourse.com  Fact Sheet for Healthcare Providers: SeriousBroker.it  This test is not yet approved or cleared by the [place] FDA and  has been authorized for detection and/or diagnosis of SARS-CoV-2 by FDA under an Emergency Use Authorization (EUA).  This EUA will remain in effect (meaning this test can be used) for the duration of   the COVID-19 declaration under Section 564(b)(1) of the A ct, 21 U.S.C. section 360bbb-3(b)(1), unless the authorization is terminated or revoked sooner.     Influenza A by PCR NEGATIVE NEGATIVE Final   Influenza B by PCR NEGATIVE NEGATIVE Final    Comment: (NOTE) The Xpert Xpress SARS-CoV-2/FLU/RSV plus assay is intended as an aid in the diagnosis of influenza from Nasopharyngeal swab specimens and should not be used as a sole basis for treatment. Nasal washings and aspirates are unacceptable for Xpert Xpress SARS-CoV-2/FLU/RSV testing.  Fact Sheet for Patients: BloggerCourse.com  Fact Sheet for Healthcare Providers: SeriousBroker.it  This test is not yet approved or cleared by the [place] FDA and has been authorized for detection and/or diagnosis of SARS-CoV-2 by FDA under an Emergency Use Authorization (EUA). This EUA will remain in effect (meaning this test can be used) for the duration of the COVID-19 declaration under Section 564(b)(1) of the Act, 21 U.S.C. section 360bbb-3(b)(1), unless the authorization is terminated or revoked.     Resp  Syncytial Virus by PCR NEGATIVE NEGATIVE Final    Comment: (NOTE) Fact Sheet for Patients: BloggerCourse.com  Fact Sheet for Healthcare Providers: SeriousBroker.it  This test is not yet approved or cleared by the [place] FDA and has been authorized for detection and/or diagnosis of SARS-CoV-2 by FDA under an Emergency Use Authorization (EUA). This EUA will remain in effect (meaning this test can be used) for the duration of the COVID-19 declaration under Section 564(b)(1) of the Act, 21 U.S.C. section 360bbb-3(b)(1), unless the authorization is terminated or revoked.  Performed at [facility], [address] [place], [place] [address]   Expectorated Sputum Assessment w Gram Stain, Rflx to Resp  Cult     Status: None   Collection Time: 09/10/23  5:32 PM   Specimen: Expectorated Sputum  Result Value Ref Range Status   Specimen Description EXPECTORATED SPUTUM  Final   Special Requests NONE  Final   Sputum evaluation   Final    THIS SPECIMEN IS ACCEPTABLE FOR SPUTUM CULTURE Performed at [facility], [address] [place], [place] [address]    Report Status 09/10/2023 FINAL  Final  Culture, Respiratory w Gram Stain     Status: None (Preliminary result)   Collection Time: 09/10/23  5:32 PM  Result Value Ref Range Status   Specimen Description   Final    EXPECTORATED SPUTUM Performed at [facility], [address], [place], [place] [address]    Special Requests   Final    NONE Reflexed from [id number] Performed at [facility], [address] [place], [place] [address]    Gram Stain   Final    FEW WBC PRESENT, PREDOMINANTLY PMN MODERATE GRAM POSITIVE COCCI FEW GRAM NEGATIVE RODS RARE GRAM POSITIVE RODS    Culture   Final    TOO YOUNG TO READ Performed at [facility], [address] [place], [place] [address]    Report Status PENDING  Incomplete         Radiology Studies: CT CHEST WO CONTRAST Result Date: 09/10/2023 CLINICAL DATA:  COVID-19 positive, hypoxia, left lower lobe non-small cell lung cancer EXAM: CT CHEST WITHOUT CONTRAST TECHNIQUE: Multidetector CT imaging of the chest was performed following the standard protocol without IV contrast. RADIATION DOSE REDUCTION: This exam was performed according to the departmental dose-optimization program which includes automated exposure control, adjustment of the mA and/or kV according to patient size and/or use of iterative reconstruction technique. COMPARISON:  09/10/2023, 07/13/2023, 10/20/2021 FINDINGS: Cardiovascular: Unenhanced imaging of the heart is unremarkable without pericardial effusion. Normal caliber of the thoracic aorta. Atherosclerosis of the  aorta and coronary vasculature. Evaluation of the vascular lumen is limited without IV contrast. Right chest wall port via internal jugular approach, tip within the superior vena cava. Mediastinum/Nodes: No enlarged mediastinal or axillary lymph nodes. Thyroid gland, trachea, and esophagus demonstrate no significant findings. Lungs/Pleura: Bilobed left lower lobe pulmonary mass is again identified, reference image 81/5 measuring 3.6 x 2.5 cm. This previously measured 3.6 x 2.7 cm by my measurement on PET scan 07/13/2023. Increased interstitial prominence and patchy consolidation within the right middle and right lower lobes consistent with multifocal right-sided bronchopneumonia. This is consistent with patient's given history of COVID. There are trace bilateral pleural effusions. Stable areas of subpleural scarring, greatest at the lung bases. Central airways are patent. Upper Abdomen: No acute abnormality. Musculoskeletal: Subacute healing right posterolateral tenth rib fracture. No other acute bony abnormalities. Reconstructed images demonstrate  no additional findings. IMPRESSION: 1. Interstitial and airspace opacities within the right middle and right lower lobes, consistent with multifocal right-sided bronchopneumonia. 2. Grossly stable bilobed left lower lobe pulmonary mass, consistent with known history of lung cancer. 3. Trace bilateral pleural effusions. 4. Aortic Atherosclerosis (ICD10-I70.0). Coronary artery atherosclerosis. Electronically Signed   By: Sharlet Salina M.D.   On: 09/10/2023 16:22   US RENAL Result Date: 09/10/2023 CLINICAL DATA:  [id number] with acute kidney injury. EXAM: RENAL / URINARY TRACT ULTRASOUND COMPLETE COMPARISON:  PET-CT 07/13/2023 FINDINGS: Right Kidney: Renal measurements: 11.9 x 5.4 x 6.5 cm = volume: 217.5 mL. Echogenicity is mildly increased. No mass, stones or hydronephrosis visualized. Left Kidney: Renal measurements: 12.5 x 6.4 x 5.1 cm = volume: 214.7 mL. Echogenicity is  mildly increased. No mass, stones or hydronephrosis visualized. Bladder: Appears normal for degree of bladder distention, both ureteral jets are not visualized during the study. Other: No free fluid. IMPRESSION: Mildly increased renal echogenicity bilaterally consistent with medical renal disease. No hydronephrosis. Electronically Signed   By: Almira Bar M.D.   On: 09/10/2023 07:48   DG Chest Port 1 View Result Date: 09/10/2023 CLINICAL DATA:  Recent history of COVID, presenting with hypoxia. EXAM: PORTABLE CHEST 1 VIEW COMPARISON:  October 20, 2021 FINDINGS: There is stable right-sided venous Port-A-Cath positioning. The heart size and mediastinal contours are within normal limits. A 1.5 cm diameter mildly opaque area is seen overlying the right lung base. The periphery of the right lung base. Mild areas of bibasilar atelectasis are also seen. No pleural effusion or pneumothorax is identified. A radiopaque fusion plate and screws are seen overlying the cervical spine. No acute osseous abnormalities are identified. IMPRESSION: 1. Mild bibasilar atelectasis. 2. 1.5 cm diameter mildly opaque area overlying the right lung base which may represent a nipple shadow. Correlation with follow-up chest plain film with nipple markers is recommended to exclude the presence of a pulmonary nodule. Electronically Signed   By: Aram Candela M.D.   On: 09/10/2023 02:54        Scheduled Meds:  aspirin EC  81 mg Oral Daily   dexamethasone (DECADRON) injection  6 mg Intravenous Q24H   fentaNYL  1 patch Transdermal Q72H   fentaNYL  1 patch Transdermal Q72H   gabapentin  300 mg Oral QHS   heparin  5,000 Units Subcutaneous Q8H   insulin aspart  0-9 Units Subcutaneous TID WC   rOPINIRole  1 mg Oral QHS   traZODone  150 mg Oral QHS   [START ON 09/16/2023] Vitamin D (Ergocalciferol)  50,000 Units Oral Q7 days   Continuous Infusions:  azithromycin Stopped (09/11/23 1240)   cefTRIAXone (ROCEPHIN)  IV 200 mL/hr at  09/11/23 1506   sodium bicarbonate 150 mEq in sterile water 1,150 mL infusion Stopped (09/11/23 1029)     LOS: 1 day    Time spent: 56 minutes spent on chart review, discussion with nursing staff, consultants, updating family and interview/physical exam; more than 50% of that time was spent in counseling and/or coordination of care.    Alvira Philips [place], DO [facility] Available via Epic secure chat 7am-7pm After these hours, please refer to coverage provider listed on [email or web address] 09/11/2023, 3:18 PM

## 2023-09-11 NOTE — Progress Notes (Signed)
[facility] Admit Date: 09/10/2023 09/11/2023 [name] Requesting Physician:  [place] DO  Reason for Consult:  AKI HPI:  23F admitted from the ED yesterday after presenting with fatigue, weakness, diarrhea.  PMH Incudes: Stage IV NSCLC cancer with bony metastases maintained on pembrolizumab CKD 3A baseline creatinine 1.1-1.2 DM2 on metformin Hypertension with no reported blood pressure medicines at home other than Lasix every other day History of CAD GERD Chronic low back pain narcotic therapy Hyperlipidemia  Patient seen in the room with daughter present.  She transitioned from local SNF where she had recently tested positive for COVID 19.  She had had poor intake with significant diarrhea.  Trend in serum creatinine as below with presenting value of 6.9 and increased to 8.0 today.  Since presentation patient has been hydrated initially with sodium chloride and transition to sodium bicarbonate therapy today.  Renal ultrasound yesterday with findings consistent with medical renal disease but no hydronephrosis or other structural abnormalities.  Urine analysis at presentation without pyuria or hematuria.  Though urine output not well-documented she has made quite a bit of urine over the past 3 hours discussion with nurse.  No use of NSAIDs at home.  She does not use RAAS inhibitor.  No contrast exposure.  Other noteworthy labs include potassium of 5.2, bicarbonate of 13 with anion gap of 17.  BUN is 70.  In the room she is somnolent but awakens to voice and answers basic questions.  She denies dyspnea.  Chest imaging suspicious of right sided pneumonia.  Started on ceftriaxone and azithromycin.   Creatinine (mg/dL)  Date Value  04/54/0981 1.12 (H)  08/09/2023 1.23 (H)  08/02/2023 1.13 (H)  07/19/2023 1.56 (H)  06/27/2023 1.48 (H)  06/08/2023 1.33 (H)  05/18/2023 1.06 (H)  04/27/2023 0.94  03/14/2023 1.03 (H)  03/02/2023 1.25 (H)   Creat (mg/dL)  Date Value   19/14/7829 0.81  06/13/2013 0.90   Creatinine, Ser (mg/dL)  Date Value  56/21/3086 8.01 (H)  09/10/2023 6.93 (H)  08/02/2023 0.62  04/16/2023 1.04 (H)  04/07/2023 1.42 (H)  04/06/2023 1.26 (H)  04/05/2023 1.34 (H)  04/04/2023 1.23 (H)  04/03/2023 1.23 (H)  04/02/2023 1.40 (H)  ] ROS Balance of 12 systems is negative w/ exceptions as above  PMH  Past Medical History:  Diagnosis Date   Aneurysm (HCC)    Right eye a non DES stent was placed so that she would not required long term dual antiplatelet therapy.   Anginal pain (HCC)    Anxiety    not currently taking any meds   Arthritis    CAD (coronary artery disease)    Diabetes mellitus without complication (HCC)    H/O hiatal hernia    Heart murmur    History of stress test 03/31/2012   The post stress myocardial perfusion images show a normal pattern of perfusion in all region. The post left ventricles is normal in size. There is no scintigraphic of inductible myocardial ischemia. The post EF is 10   Hx of echocardiogram 11/29/2010   Ef 67% Normal size chambes, Aortic valve sclerosis without stenosis, No other significant valvular abnormalities, No percardial effusion.   Hyperlipemia    Hypertension    Insulin resistance    Lung cancer (HCC)    Multiple thyroid nodules    Neuromuscular disorder (HCC)    nerve pain after shingles   Osteoporosis    Restless legs    Shingles    PSH  Past Surgical History:  Procedure Laterality Date   ABDOMINAL HYSTERECTOMY  1970   ANGIOPLASTY     Stenting of a 90% eccentric right coronary artery stenosis and had a 3.0x15 mm Integrity bare-metal stent inserted   ANTERIOR CERVICAL DECOMP/DISCECTOMY FUSION N/A 07/30/2013   Procedure: ANTERIOR CERVICAL DECOMPRESSION/DISCECTOMY FUSION CERVICAL FIVE -SIX;  Surgeon: Tia Alert, MD;  Location: [facility];  Service: Neurosurgery;  Laterality: N/A;   APPENDECTOMY     BREAST SURGERY Left    cysts removed from left breast (in her 20'2)    CARDIAC CATHETERIZATION     Showed a widely patent stent, she did have 60% ostial diagonal-1 stenosis, She also had mild luminal irregularities of her LAD.   COLONOSCOPY     EYE SURGERY Bilateral 2009   cateract surgery- bilateral   IR IMAGING GUIDED PORT INSERTION  06/04/2019   LAMINECTOMY WITH POSTERIOR LATERAL ARTHRODESIS LEVEL 2 Right 07/18/2021   Procedure: Laminectomy and Foraminotomy - Lumbar four-Lumbar five - Lumbar five-Sacral one- right, posterolateral fusion Lumbar four-five with interspinous plate;  Surgeon: Tia Alert, MD;  Location: [facility];  Service: Neurosurgery;  Laterality: Right;   LEFT HEART CATH AND CORONARY ANGIOGRAPHY N/A 11/15/2021   Procedure: LEFT HEART CATH AND CORONARY ANGIOGRAPHY;  Surgeon: Lennette Bihari, MD;  Location: [facility];  Service: Cardiovascular;  Laterality: N/A;   REFRACTIVE SURGERY Left 2011   TONSILLECTOMY     FH  Family History  Problem Relation Age of Onset   Alzheimer's disease Mother 35   Colon polyps Mother    Diabetes Mother    Heart attack Father 34   Cancer Maternal Grandmother 28   Breast cancer Maternal Grandmother    Diabetes Sister    Breast cancer Maternal Uncle    Breast cancer Maternal Aunt        x 2   Colon polyps Sister    Colon polyps Maternal Aunt    Colon cancer Neg Hx    Esophageal cancer Neg Hx    Rectal cancer Neg Hx    Stomach cancer Neg Hx    SH  reports that she has quit smoking. Her smoking use included cigarettes. She has a 66 pack-year smoking history. She has never used smokeless tobacco. She reports current alcohol use. She reports that she does not use drugs. Allergies  Allergies  Allergen Reactions   Codeine Nausea And Vomiting and Other (See Comments)    Severe constipation.   Home medications Prior to Admission medications   Medication Sig Start Date End Date Taking? Authorizing Provider  acetaminophen (TYLENOL) 500 MG tablet Take 2 tablets (1,000 mg total) by mouth every 8 (eight)  hours as needed. 04/07/23  Yes Zigmund Daniel., MD  ALPRAZolam Prudy Feeler) 0.25 MG tablet Take 0.25 mg by mouth 3 (three) times daily as needed for anxiety.   Yes [provider]  aspirin EC 81 MG tablet Take 1 tablet (81 mg total) by mouth daily. Swallow whole. 11/01/21  Yes Lennette Bihari, MD  fentaNYL (DURAGESIC) 12 MCG/HR Duragesic patch 25 mcg + 12 mcg total dose 37 mcg every 3 days 09/07/23  Yes Fargo, Amy E, NP  fentaNYL (DURAGESIC) 25 MCG/HR Place 1 patch onto the skin every 3 (three) days. Plus 12 mcg patch total dose 37 mcg every 3 days 09/07/23  Yes Fargo, Amy E, NP  gabapentin (NEURONTIN) 300 MG capsule Take 300 mg by mouth at bedtime.    Yes [provider]  loperamide (IMODIUM) 2  MG capsule Take 2-4 mg by mouth as needed for diarrhea or loose stools.   Yes [provider]  metFORMIN (GLUCOPHAGE) 500 MG tablet Take 500 mg by mouth at bedtime. 12/18/20  Yes [provider]  methylPREDNISolone (MEDROL) 4 MG tablet Take 4 mg by mouth daily. Take 1/2 tablet by mouth daily   Yes [provider]  molnupiravir EUA (LAGEVRIO) 200 mg CAPS capsule Take 4 capsules (800 mg total) by mouth 2 (two) times daily for 5 days. 09/06/23 09/11/23 Yes Medina-Vargas, Monina C, NP  Multiple Vitamins-Minerals (CENTRUM SILVER PO) Take 1 tablet by mouth daily.   Yes [provider]  nitroGLYCERIN (NITROSTAT) 0.4 MG SL tablet Place 0.4 mg under the tongue every 5 (five) minutes as needed for chest pain.   Yes [provider]  ondansetron (ZOFRAN) 8 MG tablet Take 8 mg by mouth every 8 (eight) hours as needed for nausea or vomiting.   Yes [provider]  Oxycodone HCl 10 MG TABS Take 1 tablet (10 mg total) by mouth every 6 (six) hours as needed. 08/23/23  Yes Rana Snare, NP  polyethylene glycol (MIRALAX / GLYCOLAX) packet Take 17 g by mouth daily.   Yes [provider]  polyvinyl alcohol (LIQUIFILM TEARS) 1.4 % ophthalmic solution Place 1  drop into both eyes every 8 (eight) hours as needed for dry eyes.   Yes [provider]  rOPINIRole (REQUIP) 1 MG tablet Take 1 mg by mouth 3 (three) times daily.   Yes [provider]  rosuvastatin (CRESTOR) 40 MG tablet Take 40 mg by mouth daily. 04/19/23  Yes [provider]  traZODone (DESYREL) 150 MG tablet Take 1 tablet (150 mg total) by mouth at bedtime. 04/07/23  Yes Zigmund Daniel., MD  Vitamin D, Ergocalciferol, (DRISDOL) 50000 UNITS CAPS capsule Take 50,000 Units by mouth every Sunday.   Yes [provider]  zinc gluconate 50 MG tablet Take 50 mg by mouth at bedtime.   Yes [provider]  carboxymethylcellulose (REFRESH PLUS) 0.5 % SOLN Place 1 drop into both eyes 3 (three) times daily as needed (dry eyes). Patient not taking: Reported on 09/10/2023    [provider]  furosemide (LASIX) 20 MG tablet Take 20 mg by mouth daily. one time a day every other day for edema 08/02/23   [provider]  Magnesium 400 MG CAPS Take 250 mg by mouth as needed. Takes with pain med Patient not taking: Reported on 09/06/2023    [provider]    Current Medications Scheduled Meds:  aspirin EC  81 mg Oral Daily   calcium acetate  1,334 mg Oral TID WC   dexamethasone (DECADRON) injection  6 mg Intravenous Q24H   fentaNYL  1 patch Transdermal Q72H   fentaNYL  1 patch Transdermal Q72H   gabapentin  300 mg Oral QHS   heparin  5,000 Units Subcutaneous Q8H   insulin aspart  0-9 Units Subcutaneous TID WC   ipratropium-albuterol  3 mL Nebulization TID   rOPINIRole  1 mg Oral QHS   traZODone  150 mg Oral QHS   [START ON 09/16/2023] Vitamin D (Ergocalciferol)  50,000 Units Oral Q7 days   Continuous Infusions:  azithromycin 500 mg (09/11/23 1048)   cefTRIAXone (ROCEPHIN)  IV 2 g (09/11/23 1135)   sodium bicarbonate 150 mEq in sterile water 1,150 mL infusion 125 mL/hr at 09/11/23 0922   PRN Meds:.acetaminophen **OR**  acetaminophen, albuterol, ALPRAZolam, ondansetron **OR** ondansetron (ZOFRAN)  IV, oxyCODONE, polyethylene glycol, polyvinyl alcohol  CBC Recent Labs  Lab 09/10/23 0208 09/11/23 0613  WBC 7.3 6.1  NEUTROABS 6.5  --   HGB 9.2* 9.5*  HCT 30.0* 30.8*  MCV 92.0 91.7  PLT 115* 133*   Basic Metabolic Panel Recent Labs  Lab 09/10/23 0208 09/11/23 0613  NA 133* 133*  K 4.3 5.2*  CL 104 103  CO2 16* 13*  GLUCOSE 88 120*  BUN 68* 70*  CREATININE 6.93* 8.01*  CALCIUM 7.4* 7.2*  PHOS 7.2* 8.4*    Physical Exam  Blood pressure 120/88, pulse 90, temperature (!) 97.4 F (36.3 C), temperature source Oral, resp. rate 16, SpO2 100%. GEN: Elderly female, on and off sleeping in the room ENT: Nasal cannula in place EYES: EOMI CV: Regular, normal S1 and S2 PULM: Clear throughout ABD: Soft, nontender GU: Foley in place SKIN: No rashes or lesions, mild dusky hyperpigmentation in the lower extremities consistent with chronic venous stasis EXT: Trace to 1+ lower extremity edema  Assessment 69F with AKI on CKD3a.  Imaging negative.  UA without features suggestive of GN.  Most likely is ATN developing from from COVID-19 infection and hypovolemia.  AKI complicated by metabolic acidosis, hyperphosphatemia.  AKI on CKD 3a; as above likely ATN. COVID-19 infection with potential right-sided superimposed bacterial infection on ceftriaxone and azithromycin, completed course of molnupiravir Stage IV NSCLC on pembrolizumab Hypertension not on outpatient medications; normotensive in the ED DM2 on metformin, held at admission Metabolic acidosis related to #1, started on sodium bicarbonate gtt by TRH, continue Hyperphosphatemia: No need for binders right now given AKI.  Will discontinue  Plan No strong indications for RRT, thankfully Discussed that she would not be a candidate for long-term dialysis and even a short course of dialysis to stabilize her status in anticipation of GFR recovery is a  difficult decision. For now continue supportive care as you are doing. Will revisit tomorrow but increasing urine output after placement of Foley catheter gives me optimism that she will stabilize. Daily weights, Daily Renal Panel, Strict I/Os, Avoid nephrotoxins (NSAIDs, judicious IV Contrast)    [name]  [phone] pgr 09/11/2023, 12:55 PM

## 2023-09-11 NOTE — Progress Notes (Signed)
Patient with temp of 94.2. Charge RN notified, with orders to call Rapid Response. Rapid Response notified by phone and report of patient given. Message NP of patient condition and shortly NP and RR Nurse were at patient bedside. Temp retaken 94.7. Decision made to transfer patient to Step Down to monitor more closely. Daughter [name] called and made aware of mother being transferred. Telemetry also notified of transfer. Patient phone and belonging packed and patient transferred. Report given to Misty Stanley, Charity fundraiser.

## 2023-09-11 NOTE — Progress Notes (Signed)
Pt had a rectal temp of 94.3. MD and charge RN made aware. Many warm blankets applied to patient, will continue to monitor and pass on to night shift RN.

## 2023-09-11 NOTE — ED Notes (Signed)
Pt provided with blanket under pt's legs, per pt "so that I can move them more."

## 2023-09-12 ENCOUNTER — Inpatient Hospital Stay (HOSPITAL_COMMUNITY): Payer: PPO

## 2023-09-12 DIAGNOSIS — J9601 Acute respiratory failure with hypoxia: Secondary | ICD-10-CM | POA: Diagnosis not present

## 2023-09-12 LAB — UREA NITROGEN, URINE: Urea Nitrogen, Ur: 225 mg/dL

## 2023-09-12 LAB — CBC
HCT: 26.3 % — ABNORMAL LOW (ref 36.0–46.0)
Hemoglobin: 8.3 g/dL — ABNORMAL LOW (ref 12.0–15.0)
MCH: 28.1 pg (ref 26.0–34.0)
MCHC: 31.6 g/dL (ref 30.0–36.0)
MCV: 89.2 fL (ref 80.0–100.0)
Platelets: 106 10*3/uL — ABNORMAL LOW (ref 150–400)
RBC: 2.95 MIL/uL — ABNORMAL LOW (ref 3.87–5.11)
RDW: 16 % — ABNORMAL HIGH (ref 11.5–15.5)
WBC: 5.2 10*3/uL (ref 4.0–10.5)
nRBC: 0 % (ref 0.0–0.2)

## 2023-09-12 LAB — COMPREHENSIVE METABOLIC PANEL
ALT: 27 U/L (ref 0–44)
AST: 47 U/L — ABNORMAL HIGH (ref 15–41)
Albumin: 2.4 g/dL — ABNORMAL LOW (ref 3.5–5.0)
Alkaline Phosphatase: 58 U/L (ref 38–126)
Anion gap: 15 (ref 5–15)
BUN: 77 mg/dL — ABNORMAL HIGH (ref 8–23)
CO2: 16 mmol/L — ABNORMAL LOW (ref 22–32)
Calcium: 6.7 mg/dL — ABNORMAL LOW (ref 8.9–10.3)
Chloride: 96 mmol/L — ABNORMAL LOW (ref 98–111)
Creatinine, Ser: 8.5 mg/dL — ABNORMAL HIGH (ref 0.44–1.00)
GFR, Estimated: 4 mL/min — ABNORMAL LOW (ref >60)
Glucose, Bld: 109 mg/dL — ABNORMAL HIGH (ref 70–99)
Potassium: 4.9 mmol/L (ref 3.5–5.1)
Sodium: 127 mmol/L — ABNORMAL LOW (ref 135–145)
Total Bilirubin: 0.5 mg/dL (ref 0.0–1.2)
Total Protein: 5.5 g/dL — ABNORMAL LOW (ref 6.5–8.1)

## 2023-09-12 LAB — GLUCOSE, CAPILLARY
Glucose-Capillary: 111 mg/dL — ABNORMAL HIGH (ref 70–99)
Glucose-Capillary: 122 mg/dL — ABNORMAL HIGH (ref 70–99)
Glucose-Capillary: 88 mg/dL (ref 70–99)
Glucose-Capillary: 95 mg/dL (ref 70–99)

## 2023-09-12 LAB — CK: Total CK: 1907 U/L — ABNORMAL HIGH (ref 38–234)

## 2023-09-12 LAB — MRSA NEXT GEN BY PCR, NASAL: MRSA by PCR Next Gen: NOT DETECTED

## 2023-09-12 MED ORDER — HYDRALAZINE HCL 20 MG/ML IJ SOLN: 5.0000 mg | Freq: Four times a day (QID) | INTRAMUSCULAR | Status: DC | PRN

## 2023-09-12 MED ORDER — HYDROMORPHONE HCL 1 MG/ML IJ SOLN
0.5000 mg | INTRAMUSCULAR | Status: DC | PRN
  Administered 2023-09-12: 1 mg via INTRAVENOUS
  Filled 2023-09-12 (×2): qty 1

## 2023-09-12 MED ORDER — SODIUM CHLORIDE 0.9% FLUSH: 10.0000 mL | INTRAVENOUS | Status: DC | PRN

## 2023-09-12 MED ORDER — SODIUM CHLORIDE 0.9% FLUSH
10.0000 mL | Freq: Two times a day (BID) | INTRAVENOUS | Status: DC
  Administered 2023-09-12 – 2023-09-13 (×3): 10 mL
  Administered 2023-09-14: 30 mL

## 2023-09-12 MED ORDER — LORAZEPAM 2 MG/ML IJ SOLN
1.0000 mg | Freq: Once | INTRAMUSCULAR | Status: AC
  Administered 2023-09-12: 1 mg via INTRAVENOUS
  Filled 2023-09-12: qty 1

## 2023-09-12 NOTE — Progress Notes (Signed)
Admit: 09/10/2023 LOS: 2  33F with AKI on CKD3a.  Imaging negative.  UA without features suggestive of GN.  Most likely is ATN developing from from COVID-19 infection and hypovolemia.  AKI complicated by metabolic acidosis, hyperphosphatemia.   Subjective:  Seen as she was leaving for procedure and then after receiving lorazepam Effectively anuric despite hydration and supportive care Creatinine further increased to 8.5, K4.9, bicarbonate 16 on bicarbonate drip Serum sodium declined to 127 Blood pressures fairly stable on 4 L nasal cannula Discussed status and hard choices potentailly ahead wit hdaughter  02/11 0701 - 02/12 0700 In: 2243.7 [I.V.:1872.8; IV Piggyback:370.9] Out: 117 [Urine:117]  Filed Weights   09/11/23 1454 09/11/23 2245  Weight: 83 kg 81 kg    Scheduled Meds:  aspirin EC  81 mg Oral Daily   Chlorhexidine Gluconate Cloth  6 each Topical Q0600   dexamethasone (DECADRON) injection  6 mg Intravenous Q24H   fentaNYL  1 patch Transdermal Q72H   fentaNYL  1 patch Transdermal Q72H   gabapentin  300 mg Oral QHS   heparin  5,000 Units Subcutaneous Q8H   insulin aspart  0-9 Units Subcutaneous TID WC   LORazepam  1 mg Intravenous Once   rOPINIRole  1 mg Oral QHS   sodium chloride flush  10-40 mL Intracatheter Q12H   traZODone  150 mg Oral QHS   [START ON 09/16/2023] Vitamin D (Ergocalciferol)  50,000 Units Oral Q7 days   Continuous Infusions:  azithromycin 500 mg (09/12/23 1209)   cefTRIAXone (ROCEPHIN)  IV Stopped (09/11/23 1205)   sodium bicarbonate 150 mEq in sterile water 1,150 mL infusion 125 mL/hr at 09/12/23 0940   PRN Meds:.acetaminophen **OR** acetaminophen, albuterol, ALPRAZolam, hydrALAZINE, HYDROmorphone (DILAUDID) injection, lip balm, ondansetron **OR** ondansetron (ZOFRAN) IV, mouth rinse, oxyCODONE, polyethylene glycol, polyvinyl alcohol, sodium chloride flush  Current Labs: reviewed   Physical Exam:  Blood pressure (!) 153/71, pulse (!) 57,  temperature (!) 97.5 F (36.4 C), temperature source Oral, resp. rate 13, height 5\' 2"  (1.575 m), weight 81 kg, SpO2 93%. Physical Exam  Blood pressure 120/88, pulse 90, temperature (!) 97.4 F (36.3 C), temperature source Oral, resp. rate 16, SpO2 100%. GEN: Elderly female,  somnolent ENT: Nasal cannula in place EYES: EOMI CV: Regular, normal S1 and S2 PULM: Clear throughout ABD: Soft, nontender GU: Foley in place SKIN: No rashes or lesions, mild dusky hyperpigmentation in the lower extremities consistent with chronic venous stasis EXT: Trace to 1+ lower extremity edema  A Anuric AKI on CKD 3a; likely ATN. COVID-19 infection with potential right-sided superimposed bacterial infection on ceftriaxone and azithromycin, completed course of molnupiravir; on decadron Stage IV NSCLC on pembrolizumab Hypertension not on outpatient medications; normotensive in the ED DM2 on metformin, held at admission Metabolic acidosis related to #1, stable on bicarbonate drip  hyperphosphatemia: No indication for bindrs Hyponatremia, serum osmolality 312.  Likely retained solutes of kidney failure P No current hard indications for RRT but severe AKI and low UOP suggest that recovery won't be quick if at all Difficult situation given age and her comorbidities.  I am not opposed to right time-limited trial of dialysis to see if GFR can recover but we do not have to rush into it, at least is not indicated today.  Will see if we can involve palliative care. Medication Issues; Preferred narcotic agents for pain control are hydromorphone, fentanyl, and methadone. Morphine should not be used.  Baclofen should be avoided Avoid oral sodium phosphate and magnesium citrate based  laxatives / bowel preps    Sabra Heck MD 09/12/2023, 1:02 PM  Recent Labs  Lab 09/10/23 0208 09/11/23 0613 09/12/23 0308  NA 133* 133* 127*  K 4.3 5.2* 4.9  CL 104 103 96*  CO2 16* 13* 16*  GLUCOSE 88 120* 109*  BUN 68* 70* 77*   CREATININE 6.93* 8.01* 8.50*  CALCIUM 7.4* 7.2* 6.7*  PHOS 7.2* 8.4*  --    Recent Labs  Lab 09/10/23 0208 09/11/23 0613 09/12/23 0308  WBC 7.3 6.1 5.2  NEUTROABS 6.5  --   --   HGB 9.2* 9.5* 8.3*  HCT 30.0* 30.8* 26.3*  MCV 92.0 91.7 89.2  PLT 115* 133* 106*

## 2023-09-12 NOTE — Evaluation (Signed)
Clinical/Bedside Swallow Evaluation Patient Details  Name: [name] MRN: [id number] Date of Birth: Nov 09, 1934  Today's Date: 09/12/2023 Time: SLP Start Time (ACUTE ONLY): 4098 SLP Stop Time (ACUTE ONLY): 0920 SLP Time Calculation (min) (ACUTE ONLY): 15 min  Past Medical History:  Past Medical History:  Diagnosis Date   Aneurysm (HCC)    Right eye a non DES stent was placed so that she would not required long term dual antiplatelet therapy.   Anginal pain (HCC)    Anxiety    not currently taking any meds   Arthritis    CAD (coronary artery disease)    Diabetes mellitus without complication (HCC)    H/O hiatal hernia    Heart murmur    History of stress test 03/31/2012   The post stress myocardial perfusion images show a normal pattern of perfusion in all region. The post left ventricles is normal in size. There is no scintigraphic of inductible myocardial ischemia. The post EF is 27   Hx of echocardiogram 11/29/2010   Ef 67% Normal size chambes, Aortic valve sclerosis without stenosis, No other significant valvular abnormalities, No percardial effusion.   Hyperlipemia    Hypertension    Insulin resistance    Lung cancer (HCC)    Multiple thyroid nodules    Neuromuscular disorder (HCC)    nerve pain after shingles   Osteoporosis    Restless legs    Shingles    Past Surgical History:  Past Surgical History:  Procedure Laterality Date   ABDOMINAL HYSTERECTOMY  1970   ANGIOPLASTY     Stenting of a 90% eccentric right coronary artery stenosis and had a 3.0x15 mm Integrity bare-metal stent inserted   ANTERIOR CERVICAL DECOMP/DISCECTOMY FUSION N/A 07/30/2013   Procedure: ANTERIOR CERVICAL DECOMPRESSION/DISCECTOMY FUSION CERVICAL FIVE -SIX;  Surgeon: Tia Alert, MD;  Location: [facility];  Service: Neurosurgery;  Laterality: N/A;   APPENDECTOMY     BREAST SURGERY Left    cysts removed from left breast (in her 20'2)   CARDIAC CATHETERIZATION     Showed a widely  patent stent, she did have 60% ostial diagonal-1 stenosis, She also had mild luminal irregularities of her LAD.   COLONOSCOPY     EYE SURGERY Bilateral 2009   cateract surgery- bilateral   IR IMAGING GUIDED PORT INSERTION  06/04/2019   LAMINECTOMY WITH POSTERIOR LATERAL ARTHRODESIS LEVEL 2 Right 07/18/2021   Procedure: Laminectomy and Foraminotomy - Lumbar four-Lumbar five - Lumbar five-Sacral one- right, posterolateral fusion Lumbar four-five with interspinous plate;  Surgeon: Tia Alert, MD;  Location: [facility] OR;  Service: Neurosurgery;  Laterality: Right;   LEFT HEART CATH AND CORONARY ANGIOGRAPHY N/A 11/15/2021   Procedure: LEFT HEART CATH AND CORONARY ANGIOGRAPHY;  Surgeon: Lennette Bihari, MD;  Location: [facility];  Service: Cardiovascular;  Laterality: N/A;   REFRACTIVE SURGERY Left 2011   TONSILLECTOMY     HPI:  Per MD note "88 y.o. female with past medical history significant for CAD, type 2 diabetes mellitus, HTN, HLD, RLS, chronic back pain s/p lumbar laminectomy and cervical spinal fusion, stage IV lung cancer with bone metastasis, anxiety, osteoarthritis, ACDF surgery, GERD, hiatal hernia who presented to [facility] ED on 09/10/2023 with progressive shortness of breath, nonproductive cough, fever, chills, fatigue, malaise, myalgias and decreased appetite. Interstitial and airspace opacities within the right middle and right lower lobes, consistent with multifocal right-sided bronchopneumonia. 2. Grossly stable bilobed left lower lobe pulmonary mass, consistent with known history  of lung cancer."  Swallow evaluation ordered as patient overtly coughing with ice chips overnight.    Assessment / Plan / Recommendation  Clinical Impression  SLP greeted patient patient sitting up in bed on 4 L high flow nasal cannula.  She is reporting she is tired and wants to rest but also wants water.  No focal cranial nerve deficits but generalized weakness apparent.  Reviewed her prior  esophagram in 2012 where she actually had some aspiration and barium tablet halted in her hypopharynx at that time also. At that time she was also diagnosed with presbyesophagus with tertiary contractions. She endorses long history of dysphagia and has more difficulty swallowing liquids and solids.  During evaluation SLP noted viscous white secretions retained in posterior pharynx (observed from oral cavity using flashlight) and consistent throat clearing and delayed coughing with intake.  Patient's cough fortunately is strong and she can expel secretions and she reports that she has been having dysphagia symptoms for years.  Patient at times senses retention at her distal pharynx when eating prior to admission and she reports that she would continue eating with current dysphagia symptoms.  Educated patient to prior esophagram results from 2012, repeat esophagram in 2015 did not show aspiration.  At this time will proceed with MBS today to determine if compensation strategies may be helpful to mitigate her risk (including chin tuck posture), patient is agreeable.  Daughter arrived to room following exam and SLP spoke to her in the hallway advising her of recommendations and plan.  Suspect patient's baseline multifactorial dysphagia is exacerbated given her advanced age and deconditioning.  Recommend teaspoons of thin water and medications needed with applesauce, crushed if large and not contraindicated. SLP Visit Diagnosis: Dysphagia, pharyngeal phase (R13.13);Dysphagia, pharyngoesophageal phase (R13.14)    Aspiration Risk  Severe aspiration risk    Diet Recommendation NPO except meds (Teaspoons water)    Liquid Administration via: Spoon Medication Administration: Crushed with puree (If not contraindicated) Supervision: Full supervision/cueing for compensatory strategies Compensations: Slow rate;Small sips/bites Postural Changes: Seated upright at 90 degrees;Remain upright for at least 30 minutes after  po intake    Other  Recommendations Oral Care Recommendations: Oral care BID    Recommendations for follow up therapy are one component of a multi-disciplinary discharge planning process, led by the attending physician.  Recommendations may be updated based on patient status, additional functional criteria and insurance authorization.  Follow up Recommendations Other (comment)      Assistance Recommended at Discharge    Functional Status Assessment Patient has had a recent decline in their functional status and demonstrates the ability to make significant improvements in function in a reasonable and predictable amount of time.  Frequency and Duration min 1 x/week  1 week       Prognosis Prognosis for improved oropharyngeal function: Fair Barriers to Reach Goals: Other (Comment);Time post onset      Swallow Study   General Date of Onset: 09/12/23 HPI: Per MD note "89 y.o. female with past medical history significant for CAD, type 2 diabetes mellitus, HTN, HLD, RLS, chronic back pain s/p lumbar laminectomy and cervical spinal fusion, stage IV lung cancer with bone metastasis, anxiety, osteoarthritis, ACDF surgery, GERD, hiatal hernia who presented to [facility] ED on 09/10/2023 with progressive shortness of breath, nonproductive cough, fever, chills, fatigue, malaise, myalgias and decreased appetite. Interstitial and airspace opacities within the right middle and right lower lobes, consistent with multifocal right-sided bronchopneumonia. 2. Grossly stable bilobed left lower lobe pulmonary  mass, consistent with known history of lung cancer."  Swallow evaluation ordered as patient overtly coughing with ice chips overnight. Diet Prior to this Study: NPO Temperature Spikes Noted: No Respiratory Status: Nasal cannula (HFNC 4 Liters) History of Recent Intubation: No Behavior/Cognition: Alert;Cooperative;Other (Comment);Distractible;Requires cueing Oral Cavity Assessment: Dry (Dried  secretions noted posterior pharynx) Oral Care Completed by SLP: No Oral Cavity - Dentition: Dentures, top;Other (Comment);Adequate natural dentition Vision: Impaired for self-feeding Self-Feeding Abilities: Needs assist Patient Positioning: Upright in bed Baseline Vocal Quality: Normal Volitional Cough: Strong Volitional Swallow: Able to elicit    Oral/Motor/Sensory Function Overall Oral Motor/Sensory Function: Generalized oral weakness   Ice Chips Ice chips: Not tested   Thin Liquid Thin Liquid: Impaired Presentation: Spoon Pharyngeal  Phase Impairments: Multiple swallows;Cough - Delayed;Throat Clearing - Immediate    Nectar Thick Nectar Thick Liquid: Impaired Presentation: Cup;Spoon Pharyngeal Phase Impairments: Multiple swallows;Throat Clearing - Immediate;Throat Clearing - Delayed   Honey Thick Honey Thick Liquid: Not tested   Puree Puree: Impaired Presentation: Self Fed;Spoon Pharyngeal Phase Impairments: Multiple swallows;Cough - Delayed   Solid     Solid: Not tested      Chales Abrahams 09/12/2023,9:39 AM   Esophagram from 2012 showing suspected aspiration - was not present for exam

## 2023-09-12 NOTE — Procedures (Signed)
Modified Barium Swallow Study  Patient Details  Name: [name] MRN: [id number] Date of Birth: 1935-07-06  Today's Date: 09/12/2023  Modified Barium Swallow completed.  Full report located under Chart Review in the Imaging Section.  History of Present Illness Per MD note "89 y.o. female with past medical history significant for CAD, type 2 diabetes mellitus, HTN, HLD, RLS, chronic back pain s/p lumbar laminectomy and cervical spinal fusion, stage IV lung cancer with bone metastasis, anxiety, osteoarthritis, ACDF surgery, GERD, hiatal hernia who presented to [facility] ED on 09/10/2023 with progressive shortness of breath, nonproductive cough, fever, chills, fatigue, malaise, myalgias and decreased appetite. Interstitial and airspace opacities within the right middle and right lower lobes, consistent with multifocal right-sided bronchopneumonia. 2. Grossly stable bilobed left lower lobe pulmonary mass, consistent with known history of lung cancer."  Swallow evaluation ordered as patient overtly coughing with ice chips overnight.   Clinical Impression Patient presents with moderate oropharyngeal dysphagia mostly c/b decreased oral coordination with oral transiting/mastication.  Premature spillage of boluses into pharynx noted with delay in swallow reflex resulting in penetration of puree, flash penetration of cracker and mild aspiration of thin/nectar.  Pt also with difficulty initiating swallow with attempts while solids are halting in pharynx.  Use of pudding appeared to faciliate swallow trigger. Swallow triggered with pudding over epiglottis into larynx at approx 17 seconds - despite cues as pt continued with lingual pumping and struggling to initiate swallow. Mild pharyngeal retention then noted post-swallow at vallecular space. Chin tuck posture effective to prevent significant penetration of nectar. Both cued and reflexive cough is effective to clear aspirates and penetrates.     Pt also with retention in pharynx post-swallow due to decreased tongue base retraction, impaired epiglottic deflection and decreased laryngeal closure.     Pt repeatedly stated,  I don't have problems swallowing indicating decreased awareness.  Thin liquid penetration noted before the swallow as barium *and secretions* spilled to pyriform sinus and into larynx prior to swallow trigger with head neutral position. Head of bed reclined with slight neck extension appeared more protective of airway with thin liquids.  Posture allowed flash penetration of pudding as bolus spilled into open airway with swallow attempts (ongoing oral-lingual and pharyngeal movement attempts). Once pharyngeal swallow was triggered with pudding bolus reclined, she had only trace retention.    Solid cracker spilled into open larynx and halted here for approx 4 seconds prior to swallow initiation kicking bolus into pharynx with swallow with head neutral.  Pt with approx 50% bolus retention in pyriform sinus after swallow with cracker bolus without awareness.    Cues to organize bolus in mouth prior to swallow was not effective - as pt continued with premature spillage and delay to approx 11 seconds. And providing pudding bolus on top of solid did not improve timing of swallow and allowed frank penetration of pudding.   Thin liquid swallow with head neutral resulted in silent aspiration thin in mild amount.    Fortunately pt has a strong cough and can expectorate secretions and barium throughout the study. Uncertain to cause of pt's oral control deficits and delay in her pharyngeal swallowing - given brain imaging has been negative.    Her primary aspiraiton risk appears to be with solids more than liquids.  At this time, recommend full liquid *nectar thick* w/chin tuck and tsps of thin water any time.  She may benefit from repeat when she is potentially ready for dietary advancement given her sensorimotor  impairment.   Factors  that may increase risk of adverse event in presence of aspiration [employer]s 2021): Respiratory or GI disease;Limited mobility;Frail or deconditioned  Swallow Evaluation Recommendations Recommendations: PO diet PO Diet Recommendation:  (full liquid diet , nectar and allow tsps of thin water) Supervision: Staff to assist with self-feeding Swallowing strategies  : Slow rate;Small bites/sips;Chin tuck (intermittent cough and expectorate prn) Postural changes: Position pt fully upright for meals Caregiver Recommendations: Have oral suction available   Rolena Infante, MS [facility] SLP Acute Rehab Services Office [phone]    Chales Abrahams 09/12/2023,1:59 PM

## 2023-09-12 NOTE — Progress Notes (Signed)
Called d/t pt has a temperature of 94.2.  Arrived at bedside to rechecked pt temperature per rectum result of 94.2, [facility]AD, NP notified.  Pt to transfer to ICU/SD per [facility], NP.   See Notes.

## 2023-09-12 NOTE — Progress Notes (Addendum)
PROGRESS NOTE    [name]  EAV:[id number] DOB: 06-08-1935 DOA: 09/10/2023 PCP: Garlan Fillers, MD    Brief Narrative:   DELCENIA [name] is a 89 y.o. female with past medical history significant for CAD, type 2 diabetes mellitus, HTN, HLD, RLS, chronic back pain s/p lumbar laminectomy and cervical spinal fusion, stage IV lung cancer with bone metastasis, anxiety, osteoarthritis, GERD, hiatal hernia who presented to [facility] ED on 09/10/2023 with progressive shortness of breath, nonproductive cough, fever, chills, fatigue, malaise, myalgias and decreased appetite over the last 6 days.  Recently diagnosed with with COVID-19 viral infection and has been on 4 days of molnupravir without any improvement of her symptoms.  Also endorses decreased urine output.  Denies constipation, no blood in stool, no flank pain, no dysuria, no blood in her urine, no wheezing or hemoptysis, no chest pain, no palpitations, no diaphoresis, no orthopnea/PND, no lower extremity edema.  In the ED, temperature 97.2 F, HR 68, RR 20, BP 127/54, SpO2 96% on 2 L nasal cannula.  WBC 7.3, hemoglobin 9.2, platelet count 115.  Sodium 133, potassium 4.3, chloride 104, CO2 16, BUN 68, creatinine 6.93, glucose 88.  AST 71, ALT 34, total bilirubin 0.6.  BNP 191.3.  Procalcitonin 0.33.  CK 2978.  COVID PCR positive.  Influenza A/B and RSV PCR negative.  Urinalysis unrevealing.  Chest x-ray with mild bibasilar atelectasis, 1.5 cm mildly opaque area overlying the right lung base which may represent a nipple shadow.  CT chest without contrast with interstitial airspace opacities within the right middle and lower lobes consistent with multifocal right-sided bronchopneumonia, grossly stable bilobed left lower lobe pulmonary mass consistent with known history of lung cancer, trace bilateral pleural effusions, aortic atherosclerosis.  Renal ultrasound with mildly increased renal echogenicity bilaterally consistent with  medical renal disease, no hydronephrosis.  Patient placed on normal saline at 75 mm/h, given additional 2 L NS bolus and Dilaudid 1 mg IVP.  TRH consulted for admission for further evaluation and management with concern for acute renal failure on CKD stage IIIb, bronchopneumonia.  Assessment & Plan:   Acute renal failure on CKD stage IIIb Metabolic acidosis Patient reports decreased urine output over the last few days.  Most recent creatinine 1.12 on 08/30/2023.  Creatinine on admission elevated 6.93.  Renal ultrasound with mildly increased renal echogenicity bilaterally consistent with medical renal disease, no hydronephrosis. -- Nephrology following, appreciate assistance -- Cr 6.93>8.01>8.50 -- Serum CO2 16>13>16 -- Sodium bicarbonate drip at 125 mL/h -- Foley catheter placed 2/11, strict I's and O's; close monitoring of urinary output; 117 mL reported last 24h -- Avoid nephrotoxins, renally dose all medications -- BMP daily -- May need short-term hemodialysis to see if this would turnaround her renal function, further per nephrology  Multifocal bronchopneumonia CT chest without contrast with findings consistent with multifocal right-sided bronchopneumonia -- Azithromycin 500 mg IV q24h x 5 days -- Ceftriaxone 2 g IV every 24 hours x 5 days  Recent Covid-19 viral infection Recently diagnosed outpatient, started on molnupiravir without improvement of symptoms. -- Decadron 6 mg IV every 24 hours -- Airborne/contact isolation precautions  Rhabdomyolysis, mild CK elevated 2978 on admission. -- CK [phone] -- continue IVF hydration as above -- repeat CK level in the am  Transaminitis: improving LFTs slightly elevated on admission with AST 71, ALT 34. -- Holding home statin -- repeat CMP in am  Hyponatremia Sodium 133>127, likely secondary to hypervolemic hyponatremia in setting of volume overload given acute  renal failure as above. -- BMP daily  HTN BP 148/62 this a.m.,  currently not on antihypertensives outpatient. -- Hydralazine 5mg  IV q6h PRN SBP >165 -- continue to monitor BP  HLD -- Holding home statin in the setting of slightly elevated LFTs on admission.  Type 2 diabetes mellitus At baseline on metformin 500 mg p.o. daily.  Hemoglobin A1c 7.0 on 04/04/2023, well-controlled. -- Holding home metformin -- Sensitive SSI for coverage -- CBGs qAC/HS  Anxiety -- Alprazolam 0.25 mg p.o. 3 times daily  Stage IV lung cancer with bony metastasis Pain of malignancy Chronic neck/back pain s/p lumbar laminectomy and cervical spinal fusion Follows with medical oncology outpatient, Dr. Marland Kitchen Fentanyl patch -- Oxycodone 10 mg p.o. every 6 hours as needed cancer pain -- Gabapentin 300 mg p.o. nightly  Restless leg syndrome -- Requip 1 mg p.o. 3 times daily    DVT prophylaxis: heparin injection 5,000 Units Start: 09/10/23 1400    Code Status: Do not attempt resuscitation (DNR) PRE-ARREST INTERVENTIONS DESIRED Family Communication: Updated patient's daughter [name] via telephone this morning  Disposition Plan:  Level of care: Stepdown Status is: Inpatient Remains inpatient appropriate because: IV fluid hydration, needs improvement of creatinine, may end up needing short-term HD for acute renal failure    Consultants:  Nephrology  Procedures:  Foley catheter placed 2/11  Antimicrobials:  Azithromycin 2/10>> Ceftriaxone 2/10>>   Subjective: Patient seen examined bedside, resting calmly.  Lying in bed.  Asking for pain medication for her chronic hip pain.  Was moved to the stepdown unit overnight for hypothermia which appears now resolved.  Unfortunately renal function continues to deteriorate and only 117 mL reported since Foley catheter placed yesterday.  Remains on bicarbonate drip.  Patient with no other specific questions, concerns or complaints at this time.  Denies headache, no dizziness, no chest pain, no palpitations, no shortness of  breath, no abdominal pain, no fever/chills/night sweats, no nausea/vomiting/diarrhea, no focal weakness, no paresthesia.  Updated patient's daughter [name] via telephone this morning.  No other acute events overnight per nursing staff.  Objective: Vitals:   09/12/23 0500 09/12/23 0600 09/12/23 0700 09/12/23 0800  BP: (!) 148/62 (!) 167/137 (!) 153/71   Pulse: 68 (!) 58 (!) 57   Resp: (!) 23 (!) 21 13   Temp:    98.3 F (36.8 C)  TempSrc:    Oral  SpO2: 93% 91% 93%   Weight:      Height:        Intake/Output Summary (Last 24 hours) at 09/12/2023 1018 Last data filed at 09/12/2023 0700 Gross per 24 hour  Intake 2243.74 ml  Output 117 ml  Net 2126.74 ml   Filed Weights   09/11/23 1454 09/11/23 2245  Weight: 83 kg 81 kg    Examination:  Physical Exam: GEN: NAD, alert and oriented x 3, elderly in appearance HEENT: NCAT, PERRL, EOMI, sclera clear, MMM PULM: CTAB w/o wheezes/crackles, normal respiratory effort, on 2 L nasal cannula with SpO2 94% at rest CV: RRR w/o M/G/R GI: abd soft, NTND, + BS MSK: Trace bilateral lower extremity peripheral edema, moves all extremities independently NEURO: No focal neurological deficits PSYCH: Depressed mood, flat affect Integumentary: No concerning rashes/lesions/wounds noted on exposed skin surfaces    Data Reviewed: I have personally reviewed following labs and imaging studies  CBC: Recent Labs  Lab 09/10/23 0208 09/11/23 0613 09/12/23 0308  WBC 7.3 6.1 5.2  NEUTROABS 6.5  --   --   HGB 9.2* 9.5* 8.3*  HCT 30.0* 30.8* 26.3*  MCV 92.0 91.7 89.2  PLT 115* 133* 106*   Basic Metabolic Panel: Recent Labs  Lab 09/10/23 0208 09/11/23 0613 09/12/23 0308  NA 133* 133* 127*  K 4.3 5.2* 4.9  CL 104 103 96*  CO2 16* 13* 16*  GLUCOSE 88 120* 109*  BUN 68* 70* 77*  CREATININE 6.93* 8.01* 8.50*  CALCIUM 7.4* 7.2* 6.7*  MG 3.2*  --   --   PHOS 7.2* 8.4*  --    GFR: Estimated Creatinine Clearance: 4.7 mL/min (A) (by C-G  formula based on SCr of 8.5 mg/dL (H)). Liver Function Tests: Recent Labs  Lab 09/10/23 0208 09/11/23 0613 09/12/23 0308  AST 71* 65* 47*  ALT 34 31 27  ALKPHOS 62 65 58  BILITOT 0.6 0.6 0.5  PROT 6.4* 6.4* 5.5*  ALBUMIN 2.9* 2.7* 2.4*   No results for input(s): "LIPASE", "AMYLASE" in the last 168 hours. No results for input(s): "AMMONIA" in the last 168 hours. Coagulation Profile: No results for input(s): "INR", "PROTIME" in the last 168 hours. Cardiac Enzymes: Recent Labs  Lab 09/10/23 0208 09/11/23 0613 09/12/23 0308  CKTOTAL 2,978* 2,433* 1,907*   BNP (last 3 results) No results for input(s): "PROBNP" in the last 8760 hours. HbA1C: No results for input(s): "HGBA1C" in the last 72 hours. CBG: Recent Labs  Lab 09/11/23 0820 09/11/23 1146 09/11/23 1618 09/11/23 2013 09/12/23 0753  GLUCAP 113* 120* 140* 137* 88   Lipid Profile: No results for input(s): "CHOL", "HDL", "LDLCALC", "TRIG", "CHOLHDL", "LDLDIRECT" in the last 72 hours. Thyroid Function Tests: No results for input(s): "TSH", "T4TOTAL", "FREET4", "T3FREE", "THYROIDAB" in the last 72 hours. Anemia Panel: No results for input(s): "VITAMINB12", "FOLATE", "FERRITIN", "TIBC", "IRON", "RETICCTPCT" in the last 72 hours. Sepsis Labs: Recent Labs  Lab 09/10/23 0208 09/10/23 0835 09/11/23 0613  PROCALCITON 0.33  --  0.58  LATICACIDVEN  --  1.1  --     Recent Results (from the past 240 hours)  Resp panel by RT-PCR (RSV, Flu A&B, Covid) Anterior Nasal Swab     Status: Abnormal   Collection Time: 09/10/23  2:08 AM   Specimen: Anterior Nasal Swab  Result Value Ref Range Status   SARS Coronavirus 2 by RT PCR POSITIVE (A) NEGATIVE Final    Comment: (NOTE) SARS-CoV-2 target nucleic acids are DETECTED.  The SARS-CoV-2 RNA is generally detectable in upper respiratory specimens during the acute phase of infection. Positive results are indicative of the presence of the identified virus, but do not rule out  bacterial infection or co-infection with other pathogens not detected by the test. Clinical correlation with patient history and other diagnostic information is necessary to determine patient infection status. The expected result is Negative.  Fact Sheet for Patients: BloggerCourse.com  Fact Sheet for Healthcare Providers: SeriousBroker.it  This test is not yet approved or cleared by the [place] FDA and  has been authorized for detection and/or diagnosis of SARS-CoV-2 by FDA under an Emergency Use Authorization (EUA).  This EUA will remain in effect (meaning this test can be used) for the duration of  the COVID-19 declaration under Section 564(b)(1) of the A ct, 21 U.S.C. section 360bbb-3(b)(1), unless the authorization is terminated or revoked sooner.     Influenza A by PCR NEGATIVE NEGATIVE Final   Influenza B by PCR NEGATIVE NEGATIVE Final    Comment: (NOTE) The Xpert Xpress SARS-CoV-2/FLU/RSV plus assay is intended as an aid in the diagnosis of influenza from Nasopharyngeal swab specimens  and should not be used as a sole basis for treatment. Nasal washings and aspirates are unacceptable for Xpert Xpress SARS-CoV-2/FLU/RSV testing.  Fact Sheet for Patients: BloggerCourse.com  Fact Sheet for Healthcare Providers: SeriousBroker.it  This test is not yet approved or cleared by the [place] FDA and has been authorized for detection and/or diagnosis of SARS-CoV-2 by FDA under an Emergency Use Authorization (EUA). This EUA will remain in effect (meaning this test can be used) for the duration of the COVID-19 declaration under Section 564(b)(1) of the Act, 21 U.S.C. section 360bbb-3(b)(1), unless the authorization is terminated or revoked.     Resp Syncytial Virus by PCR NEGATIVE NEGATIVE Final    Comment: (NOTE) Fact Sheet for  Patients: BloggerCourse.com  Fact Sheet for Healthcare Providers: SeriousBroker.it  This test is not yet approved or cleared by the [place] FDA and has been authorized for detection and/or diagnosis of SARS-CoV-2 by FDA under an Emergency Use Authorization (EUA). This EUA will remain in effect (meaning this test can be used) for the duration of the COVID-19 declaration under Section 564(b)(1) of the Act, 21 U.S.C. section 360bbb-3(b)(1), unless the authorization is terminated or revoked.  Performed at [facility], [address] [place], [place] [address]   Expectorated Sputum Assessment w Gram Stain, Rflx to Resp Cult     Status: None   Collection Time: 09/10/23  5:32 PM   Specimen: Expectorated Sputum  Result Value Ref Range Status   Specimen Description EXPECTORATED SPUTUM  Final   Special Requests NONE  Final   Sputum evaluation   Final    THIS SPECIMEN IS ACCEPTABLE FOR SPUTUM CULTURE Performed at [facility], [address], [place], [place] [address]    Report Status 09/10/2023 FINAL  Final  Culture, Respiratory w Gram Stain     Status: None (Preliminary result)   Collection Time: 09/10/23  5:32 PM  Result Value Ref Range Status   Specimen Description   Final    EXPECTORATED SPUTUM Performed at [facility], [address] [place], [place] [address]    Special Requests   Final    NONE Reflexed from [id number] Performed at [facility], [address], [place], [place] [address]    Gram Stain   Final    FEW WBC PRESENT, PREDOMINANTLY PMN MODERATE GRAM POSITIVE COCCI FEW GRAM NEGATIVE RODS RARE GRAM POSITIVE RODS    Culture   Final    TOO YOUNG TO READ Performed at [facility], [address], [place], [place] [address]    Report Status PENDING  Incomplete  MRSA Next Gen by PCR, Nasal     Status: None   Collection Time:  09/11/23 11:11 PM   Specimen: Nasal Mucosa; Nasal Swab  Result Value Ref Range Status   MRSA by PCR Next Gen NOT DETECTED NOT DETECTED Final    Comment: (NOTE) The GeneXpert MRSA Assay (FDA approved for NASAL specimens only), is one component of a comprehensive MRSA colonization surveillance program. It is not intended to diagnose MRSA infection nor to guide or monitor treatment for MRSA infections. Test performance is not FDA approved in patients less than 31 years old. Performed at [facility], [address] [place], [place] [address]          Radiology Studies: CT CHEST WO CONTRAST Result Date: 09/10/2023 CLINICAL DATA:  COVID-19 positive, hypoxia, left lower lobe non-small cell lung cancer EXAM: CT CHEST WITHOUT CONTRAST  TECHNIQUE: Multidetector CT imaging of the chest was performed following the standard protocol without IV contrast. RADIATION DOSE REDUCTION: This exam was performed according to the departmental dose-optimization program which includes automated exposure control, adjustment of the mA and/or kV according to patient size and/or use of iterative reconstruction technique. COMPARISON:  09/10/2023, 07/13/2023, 10/20/2021 FINDINGS: Cardiovascular: Unenhanced imaging of the heart is unremarkable without pericardial effusion. Normal caliber of the thoracic aorta. Atherosclerosis of the aorta and coronary vasculature. Evaluation of the vascular lumen is limited without IV contrast. Right chest wall port via internal jugular approach, tip within the superior vena cava. Mediastinum/Nodes: No enlarged mediastinal or axillary lymph nodes. Thyroid gland, trachea, and esophagus demonstrate no significant findings. Lungs/Pleura: Bilobed left lower lobe pulmonary mass is again identified, reference image 81/5 measuring 3.6 x 2.5 cm. This previously measured 3.6 x 2.7 cm by my measurement on PET scan 07/13/2023. Increased interstitial prominence and patchy consolidation  within the right middle and right lower lobes consistent with multifocal right-sided bronchopneumonia. This is consistent with patient's given history of COVID. There are trace bilateral pleural effusions. Stable areas of subpleural scarring, greatest at the lung bases. Central airways are patent. Upper Abdomen: No acute abnormality. Musculoskeletal: Subacute healing right posterolateral tenth rib fracture. No other acute bony abnormalities. Reconstructed images demonstrate no additional findings. IMPRESSION: 1. Interstitial and airspace opacities within the right middle and right lower lobes, consistent with multifocal right-sided bronchopneumonia. 2. Grossly stable bilobed left lower lobe pulmonary mass, consistent with known history of lung cancer. 3. Trace bilateral pleural effusions. 4. Aortic Atherosclerosis (ICD10-I70.0). Coronary artery atherosclerosis. Electronically Signed   By: Sharlet Salina M.D.   On: 09/10/2023 16:22        Scheduled Meds:  aspirin EC  81 mg Oral Daily   Chlorhexidine Gluconate Cloth  6 each Topical Q0600   dexamethasone (DECADRON) injection  6 mg Intravenous Q24H   fentaNYL  1 patch Transdermal Q72H   fentaNYL  1 patch Transdermal Q72H   gabapentin  300 mg Oral QHS   heparin  5,000 Units Subcutaneous Q8H   insulin aspart  0-9 Units Subcutaneous TID WC   rOPINIRole  1 mg Oral QHS   sodium chloride flush  10-40 mL Intracatheter Q12H   traZODone  150 mg Oral QHS   [START ON 09/16/2023] Vitamin D (Ergocalciferol)  50,000 Units Oral Q7 days   Continuous Infusions:  azithromycin Stopped (09/11/23 1240)   cefTRIAXone (ROCEPHIN)  IV Stopped (09/11/23 1205)   sodium bicarbonate 150 mEq in sterile water 1,150 mL infusion 125 mL/hr at 09/12/23 0940     LOS: 2 days    Time spent: 54 minutes spent on chart review, discussion with nursing staff, consultants, updating family and interview/physical exam; more than 50% of that time was spent in counseling and/or  coordination of care.    Alvira Philips [place], DO [facility] Available via Epic secure chat 7am-7pm After these hours, please refer to coverage provider listed on [email or web address] 09/12/2023, 10:18 AM

## 2023-09-12 NOTE — Progress Notes (Signed)
PT Cancellation Note  Patient Details Name: [name] MRN: [id number] DOB: 14-Mar-1935   Cancelled Treatment:    Reason Eval/Treat Not Completed: Fatigue/lethargy limiting ability to participate  Recently had MBS. Will check back tomorrow. Blanchard Kelch PT Acute Rehabilitation Services Office [phone] Weekend pager-[phone]  Rada Hay 09/12/2023, 2:38 PM

## 2023-09-12 NOTE — Plan of Care (Signed)
  Problem: Education: Goal: Knowledge of risk factors and measures for prevention of condition will improve Outcome: Progressing   Problem: Coping: Goal: Psychosocial and spiritual needs will be supported Outcome: Progressing   Problem: Respiratory: Goal: Will maintain a patent airway Outcome: Progressing Goal: Complications related to the disease process, condition or treatment will be avoided or minimized Outcome: Progressing   Problem: Education: Goal: Ability to describe self-care measures that may prevent or decrease complications (Diabetes Survival Skills Education) will improve Outcome: Progressing Goal: Individualized Educational Video(s) Outcome: Progressing   Problem: Coping: Goal: Ability to adjust to condition or change in health will improve Outcome: Progressing   Problem: Fluid Volume: Goal: Ability to maintain a balanced intake and output will improve Outcome: Progressing   Problem: Health Behavior/Discharge Planning: Goal: Ability to identify and utilize available resources and services will improve Outcome: Progressing Goal: Ability to manage health-related needs will improve Outcome: Progressing   Problem: Metabolic: Goal: Ability to maintain appropriate glucose levels will improve Outcome: Progressing   Problem: Nutritional: Goal: Maintenance of adequate nutrition will improve Outcome: Progressing Goal: Progress toward achieving an optimal weight will improve Outcome: Progressing   Problem: Skin Integrity: Goal: Risk for impaired skin integrity will decrease Outcome: Progressing   Problem: Tissue Perfusion: Goal: Adequacy of tissue perfusion will improve Outcome: Progressing   Problem: Education: Goal: Knowledge of General Education information will improve Description: Including pain rating scale, medication(s)/side effects and non-pharmacologic comfort measures Outcome: Progressing   Problem: Health Behavior/Discharge Planning: Goal:  Ability to manage health-related needs will improve Outcome: Progressing   Problem: Clinical Measurements: Goal: Ability to maintain clinical measurements within normal limits will improve Outcome: Progressing Goal: Will remain free from infection Outcome: Progressing Goal: Diagnostic test results will improve Outcome: Progressing Goal: Respiratory complications will improve Outcome: Progressing Goal: Cardiovascular complication will be avoided Outcome: Progressing   Problem: Activity: Goal: Risk for activity intolerance will decrease Outcome: Progressing   Problem: Nutrition: Goal: Adequate nutrition will be maintained Outcome: Progressing   Problem: Coping: Goal: Level of anxiety will decrease Outcome: Progressing   Problem: Elimination: Goal: Will not experience complications related to bowel motility Outcome: Progressing Goal: Will not experience complications related to urinary retention Outcome: Progressing   Problem: Pain Managment: Goal: General experience of comfort will improve and/or be controlled Outcome: Progressing   Problem: Safety: Goal: Ability to remain free from injury will improve Outcome: Progressing   Problem: Skin Integrity: Goal: Risk for impaired skin integrity will decrease Outcome: Progressing   Problem: Education: Goal: Knowledge of disease or condition will improve Outcome: Progressing Goal: Knowledge of the prescribed therapeutic regimen will improve Outcome: Progressing Goal: Individualized Educational Video(s) Outcome: Progressing   Problem: Activity: Goal: Ability to tolerate increased activity will improve Outcome: Progressing Goal: Will verbalize the importance of balancing activity with adequate rest periods Outcome: Progressing   Problem: Respiratory: Goal: Ability to maintain a clear airway will improve Outcome: Progressing Goal: Levels of oxygenation will improve Outcome: Progressing Goal: Ability to maintain  adequate ventilation will improve Outcome: Progressing   Problem: Activity: Goal: Ability to tolerate increased activity will improve Outcome: Progressing   Problem: Clinical Measurements: Goal: Ability to maintain a body temperature in the normal range will improve Outcome: Progressing   Problem: Respiratory: Goal: Ability to maintain adequate ventilation will improve Outcome: Progressing Goal: Ability to maintain a clear airway will improve Outcome: Progressing

## 2023-09-13 DIAGNOSIS — J9601 Acute respiratory failure with hypoxia: Secondary | ICD-10-CM | POA: Diagnosis not present

## 2023-09-13 DIAGNOSIS — Z515 Encounter for palliative care: Secondary | ICD-10-CM

## 2023-09-13 LAB — COMPREHENSIVE METABOLIC PANEL
ALT: 27 U/L (ref 0–44)
AST: 53 U/L — ABNORMAL HIGH (ref 15–41)
Albumin: 2.6 g/dL — ABNORMAL LOW (ref 3.5–5.0)
Alkaline Phosphatase: 55 U/L (ref 38–126)
Anion gap: 18 — ABNORMAL HIGH (ref 5–15)
BUN: 89 mg/dL — ABNORMAL HIGH (ref 8–23)
CO2: 22 mmol/L (ref 22–32)
Calcium: 6.2 mg/dL — CL (ref 8.9–10.3)
Chloride: 90 mmol/L — ABNORMAL LOW (ref 98–111)
Creatinine, Ser: 8.47 mg/dL — ABNORMAL HIGH (ref 0.44–1.00)
GFR, Estimated: 4 mL/min — ABNORMAL LOW (ref >60)
Glucose, Bld: 99 mg/dL (ref 70–99)
Potassium: 4.8 mmol/L (ref 3.5–5.1)
Sodium: 130 mmol/L — ABNORMAL LOW (ref 135–145)
Total Bilirubin: 0.3 mg/dL (ref 0.0–1.2)
Total Protein: 5.6 g/dL — ABNORMAL LOW (ref 6.5–8.1)

## 2023-09-13 LAB — CULTURE, RESPIRATORY W GRAM STAIN

## 2023-09-13 LAB — CK: Total CK: 2032 U/L — ABNORMAL HIGH (ref 38–234)

## 2023-09-13 MED ORDER — HYDROMORPHONE HCL 1 MG/ML IJ SOLN
0.5000 mg | INTRAMUSCULAR | Status: DC | PRN
  Administered 2023-09-13 (×4): 1 mg via INTRAVENOUS
  Administered 2023-09-14: 2 mg via INTRAVENOUS
  Administered 2023-09-14: 1 mg via INTRAVENOUS
  Administered 2023-09-14: 2 mg via INTRAVENOUS
  Filled 2023-09-13 (×2): qty 2
  Filled 2023-09-13 (×5): qty 1

## 2023-09-13 MED ORDER — GLYCOPYRROLATE 0.2 MG/ML IJ SOLN
0.2000 mg | INTRAMUSCULAR | Status: DC | PRN
  Administered 2023-09-14 (×2): 0.2 mg via INTRAVENOUS
  Filled 2023-09-13 (×2): qty 1

## 2023-09-13 MED ORDER — HALOPERIDOL 1 MG PO TABS: 0.5000 mg | ORAL_TABLET | ORAL | Status: DC | PRN

## 2023-09-13 MED ORDER — LORAZEPAM 2 MG/ML PO CONC: 1.0000 mg | ORAL | Status: DC | PRN

## 2023-09-13 MED ORDER — HALOPERIDOL LACTATE 2 MG/ML PO CONC: 0.5000 mg | ORAL | Status: DC | PRN

## 2023-09-13 MED ORDER — LORAZEPAM 1 MG PO TABS: 1.0000 mg | ORAL_TABLET | ORAL | Status: DC | PRN

## 2023-09-13 MED ORDER — LORAZEPAM 2 MG/ML IJ SOLN
1.0000 mg | INTRAMUSCULAR | Status: DC | PRN
  Administered 2023-09-13 (×2): 1 mg via INTRAVENOUS
  Filled 2023-09-13 (×2): qty 1

## 2023-09-13 MED ORDER — HALOPERIDOL LACTATE 5 MG/ML IJ SOLN
0.5000 mg | INTRAMUSCULAR | Status: DC | PRN
  Administered 2023-09-14 (×2): 0.5 mg via INTRAVENOUS
  Filled 2023-09-13 (×2): qty 1

## 2023-09-13 NOTE — Progress Notes (Signed)
PROGRESS NOTE    [name]  QMV:[id number] DOB: [birth year] DOA: 09/10/2023 PCP: Garlan Fillers, MD    Brief Narrative:   [name] is a 89 y.o. female with past medical history significant for CAD, type 2 diabetes mellitus, HTN, HLD, RLS, chronic back pain s/p lumbar laminectomy and cervical spinal fusion, stage IV lung cancer with bone metastasis, anxiety, osteoarthritis, GERD, hiatal hernia who presented to [facility] ED on 09/10/2023 with progressive shortness of breath, nonproductive cough, fever, chills, fatigue, malaise, myalgias and decreased appetite over the last 6 days.  Recently diagnosed with with COVID-19 viral infection and has been on 4 days of molnupravir without any improvement of her symptoms.  Also endorses decreased urine output.  Denies constipation, no blood in stool, no flank pain, no dysuria, no blood in her urine, no wheezing or hemoptysis, no chest pain, no palpitations, no diaphoresis, no orthopnea/PND, no lower extremity edema.  In the ED, temperature 97.2 F, HR 68, RR 20, BP 127/54, SpO2 96% on 2 L nasal cannula.  WBC 7.3, hemoglobin 9.2, platelet count 115.  Sodium 133, potassium 4.3, chloride 104, CO2 16, BUN 68, creatinine 6.93, glucose 88.  AST 71, ALT 34, total bilirubin 0.6.  BNP 191.3.  Procalcitonin 0.33.  CK 2978.  COVID PCR positive.  Influenza A/B and RSV PCR negative.  Urinalysis unrevealing.  Chest x-ray with mild bibasilar atelectasis, 1.5 cm mildly opaque area overlying the right lung base which may represent a nipple shadow.  CT chest without contrast with interstitial airspace opacities within the right middle and lower lobes consistent with multifocal right-sided bronchopneumonia, grossly stable bilobed left lower lobe pulmonary mass consistent with known history of lung cancer, trace bilateral pleural effusions, aortic atherosclerosis.  Renal ultrasound with mildly increased renal echogenicity bilaterally consistent with  medical renal disease, no hydronephrosis.  Patient placed on normal saline at 75 mm/h, given additional 2 L NS bolus and Dilaudid 1 mg IVP.  TRH consulted for admission for further evaluation and management with concern for acute renal failure on CKD stage IIIb, bronchopneumonia.  Assessment & Plan:   Acute renal failure on CKD stage IIIb Metabolic acidosis Patient reports decreased urine output over the last few days.  Most recent creatinine 1.12 on 08/30/2023.  Creatinine on admission elevated 6.93.  Renal ultrasound with mildly increased renal echogenicity bilaterally consistent with medical renal disease, no hydronephrosis.  Nephrology was consulted and followed during hospital course.  Patient was started on a sodium bicarbonate drip and Foley catheter was placed for close monitoring of urine output.  Unfortunately patient did not respond well to treatment with further progression of her renal dysfunction.  Given patient's overall decline, comorbidities including stage IV lung cancer patient and family decided to transition to comfort measures.  -- Continue fentanyl patch -- Dilaudid 0.5-2 mg IV every 30 minutes as needed severe pain, respiratory distress -- Ativan 1 mg every 4 hours as needed anxiety -- Haldol 0.5 mg every 4 hours as needed agitation/delirium  Multifocal bronchopneumonia CT chest without contrast with findings consistent with multifocal right-sided bronchopneumonia.  Initially started on azithromycin and ceftriaxone, now discontinued as transition to comfort measures.  Recent Covid-19 viral infection Recently diagnosed outpatient, started on molnupiravir without improvement of symptoms.  Initially placed on Decadron and now will discontinued as on comfort measures.  Rhabdomyolysis, mild CK elevated 2978 on admission.  Supported with IV fluid hydration.  Transaminitis: LFTs slightly elevated on admission with AST 71, ALT 34.  Hyponatremia  Sodium 133 on admission, likely  secondary to hypervolemic hyponatremia in setting of volume overload given acute renal failure as above.  Now transition to comfort measures.  HTN Not on antihypertensive therapy outpatient  HLD Discontinue statin now on comfort measures  Type 2 diabetes mellitus At baseline on metformin 500 mg p.o. daily.  Hemoglobin A1c 7.0 on 04/04/2023, well-controlled.  Anxiety Alprazolam 0.25 mg p.o. 3 times daily.  Ativan every 4 hours as needed anxiety.  Stage IV lung cancer with bony metastasis Pain of malignancy Chronic neck/back pain s/p lumbar laminectomy and cervical spinal fusion Follows with medical oncology outpatient, Dr. Grayling Congress.  Continue fentanyl patch, Oxycodone 10 mg p.o. every 6 hours as needed cancer pain.  Dilaudid 0.2-2 mg IV every 30 minutes as needed severe pain.  Restless leg syndrome Requip discontinued    DVT prophylaxis: Comfort measures    Code Status: Do not attempt resuscitation (DNR) - Comfort care Family Communication: Updated patient's daughter [name] via telephone this morning  Disposition Plan:  Level of care: Med-Surg Status is: Inpatient Remains inpatient appropriate because: Transitioning to comfort measures  Consultants:  Nephrology  Procedures:  Foley catheter placed 2/11  Antimicrobials:  Azithromycin  Ceftriaxone    Subjective: Patient seen examined bedside, lying in bed.  Called by RN to bedside given patient's anxiety and pain.  Overnight patient became bradycardic with rates down to 35, EKG notable for junctional rhythm.  Patient is alert and oriented at bedside and wishes to go to "hospice".  She reports "I want to be with my husband who passed away 8 months ago".  Discussed with patient as well as daughter on the telephone on speaker this morning given her progression of her renal function and further decline recommend transitioning to comfort measures at this time which all parties were in agreements.  Discussed with nephrology.  No  other concerns per RN presently.    Objective: Vitals:   09/13/23 0820 09/13/23 0900 09/13/23 1000 09/13/23 1050  BP:      Pulse: (!) 43 63 (!) 52 64  Resp: 11 14 12 16   Temp:      TempSrc:      SpO2: (!) 88% (!) 89% 92% 92%  Weight:      Height:        Intake/Output Summary (Last 24 hours) at 09/13/2023 1056 Last data filed at 09/13/2023 0944 Gross per 24 hour  Intake 3248.74 ml  Output 46 ml  Net 3202.74 ml   Filed Weights   09/11/23 1454 09/11/23 2245  Weight: 83 kg 81 kg    Examination:  Physical Exam: GEN: Mild distress, anxious, alert and oriented x 3, elderly/ill in appearance HEENT: NCAT, PERRL, EOMI, sclera clear, MMM PULM: CTAB w/o wheezes/crackles, normal respiratory effort, on 2 L nasal cannula with SpO2 94% at rest CV: RRR w/o M/G/R GI: abd soft, NTND, + BS MSK: Trace bilateral lower extremity peripheral edema, moves all extremities independently NEURO: No focal neurological deficits PSYCH: Anxious mood Integumentary: No concerning rashes/lesions/wounds noted on exposed skin surfaces    Data Reviewed: I have personally reviewed following labs and imaging studies  CBC: Recent Labs  Lab 09/10/23 0208 09/11/23 0613 09/12/23 0308  WBC 7.3 6.1 5.2  NEUTROABS 6.5  --   --   HGB 9.2* 9.5* 8.3*  HCT 30.0* 30.8* 26.3*  MCV 92.0 91.7 89.2  PLT 115* 133* 106*   Basic Metabolic Panel: Recent Labs  Lab 09/10/23 0208 09/11/23 0613 09/12/23 0308 09/13/23 0420  NA  133* 133* 127* 130*  K 4.3 5.2* 4.9 4.8  CL 104 103 96* 90*  CO2 16* 13* 16* 22  GLUCOSE 88 120* 109* 99  BUN 68* 70* 77* 89*  CREATININE 6.93* 8.01* 8.50* 8.47*  CALCIUM 7.4* 7.2* 6.7* 6.2*  MG 3.2*  --   --   --   PHOS 7.2* 8.4*  --   --    GFR: Estimated Creatinine Clearance: 4.7 mL/min (A) (by C-G formula based on SCr of 8.47 mg/dL (H)). Liver Function Tests: Recent Labs  Lab 09/10/23 0208 09/11/23 0613 09/12/23 0308 09/13/23 0420  AST 71* 65* 47* 53*  ALT 34 31 27 27    ALKPHOS 62 65 58 55  BILITOT 0.6 0.6 0.5 0.3  PROT 6.4* 6.4* 5.5* 5.6*  ALBUMIN 2.9* 2.7* 2.4* 2.6*   No results for input(s): "LIPASE", "AMYLASE" in the last 168 hours. No results for input(s): "AMMONIA" in the last 168 hours. Coagulation Profile: No results for input(s): "INR", "PROTIME" in the last 168 hours. Cardiac Enzymes: Recent Labs  Lab 09/10/23 0208 09/11/23 3295 09/12/23 0308 09/13/23 0420  CKTOTAL 2,978* 2,433* 1,907* 2,032*   BNP (last 3 results) No results for input(s): "PROBNP" in the last 8760 hours. HbA1C: No results for input(s): "HGBA1C" in the last 72 hours. CBG: Recent Labs  Lab 09/11/23 2013 09/12/23 0753 09/12/23 1142 09/12/23 1610 09/12/23 2349  GLUCAP 137* 88 95 122* 111*   Lipid Profile: No results for input(s): "CHOL", "HDL", "LDLCALC", "TRIG", "CHOLHDL", "LDLDIRECT" in the last 72 hours. Thyroid Function Tests: No results for input(s): "TSH", "T4TOTAL", "FREET4", "T3FREE", "THYROIDAB" in the last 72 hours. Anemia Panel: No results for input(s): "VITAMINB12", "FOLATE", "FERRITIN", "TIBC", "IRON", "RETICCTPCT" in the last 72 hours. Sepsis Labs: Recent Labs  Lab 09/10/23 0208 09/10/23 0835 09/11/23 0613  PROCALCITON 0.33  --  0.58  LATICACIDVEN  --  1.1  --     Recent Results (from the past 240 hours)  Resp panel by RT-PCR (RSV, Flu A&B, Covid) Anterior Nasal Swab     Status: Abnormal   Collection Time: 09/10/23  2:08 AM   Specimen: Anterior Nasal Swab  Result Value Ref Range Status   SARS Coronavirus 2 by RT PCR POSITIVE (A) NEGATIVE Final    Comment: (NOTE) SARS-CoV-2 target nucleic acids are DETECTED.  The SARS-CoV-2 RNA is generally detectable in upper respiratory specimens during the acute phase of infection. Positive results are indicative of the presence of the identified virus, but do not rule out bacterial infection or co-infection with other pathogens not detected by the test. Clinical correlation with patient history  and other diagnostic information is necessary to determine patient infection status. The expected result is Negative.  Fact Sheet for Patients: BloggerCourse.com  Fact Sheet for Healthcare Providers: SeriousBroker.it  This test is not yet approved or cleared by the [place] FDA and  has been authorized for detection and/or diagnosis of SARS-CoV-2 by FDA under an Emergency Use Authorization (EUA).  This EUA will remain in effect (meaning this test can be used) for the duration of  the COVID-19 declaration under Section 564(b)(1) of the A ct, 21 U.S.C. section 360bbb-3(b)(1), unless the authorization is terminated or revoked sooner.     Influenza A by PCR NEGATIVE NEGATIVE Final   Influenza B by PCR NEGATIVE NEGATIVE Final    Comment: (NOTE) The Xpert Xpress SARS-CoV-2/FLU/RSV plus assay is intended as an aid in the diagnosis of influenza from Nasopharyngeal swab specimens and should not be used as  a sole basis for treatment. Nasal washings and aspirates are unacceptable for Xpert Xpress SARS-CoV-2/FLU/RSV testing.  Fact Sheet for Patients: BloggerCourse.com  Fact Sheet for Healthcare Providers: SeriousBroker.it  This test is not yet approved or cleared by the [place] FDA and has been authorized for detection and/or diagnosis of SARS-CoV-2 by FDA under an Emergency Use Authorization (EUA). This EUA will remain in effect (meaning this test can be used) for the duration of the COVID-19 declaration under Section 564(b)(1) of the Act, 21 U.S.C. section 360bbb-3(b)(1), unless the authorization is terminated or revoked.     Resp Syncytial Virus by PCR NEGATIVE NEGATIVE Final    Comment: (NOTE) Fact Sheet for Patients: BloggerCourse.com  Fact Sheet for Healthcare Providers: SeriousBroker.it  This test is not yet  approved or cleared by the [place] FDA and has been authorized for detection and/or diagnosis of SARS-CoV-2 by FDA under an Emergency Use Authorization (EUA). This EUA will remain in effect (meaning this test can be used) for the duration of the COVID-19 declaration under Section 564(b)(1) of the Act, 21 U.S.C. section 360bbb-3(b)(1), unless the authorization is terminated or revoked.  Performed at [facility], [address] [place], [place] [address]   Expectorated Sputum Assessment w Gram Stain, Rflx to Resp Cult     Status: None   Collection Time: 09/10/23  5:32 PM   Specimen: Expectorated Sputum  Result Value Ref Range Status   Specimen Description EXPECTORATED SPUTUM  Final   Special Requests NONE  Final   Sputum evaluation   Final    THIS SPECIMEN IS ACCEPTABLE FOR SPUTUM CULTURE Performed at [facility], [address] [place], [place] [address]    Report Status 09/10/2023 FINAL  Final  Culture, Respiratory w Gram Stain     Status: None (Preliminary result)   Collection Time: 09/10/23  5:32 PM  Result Value Ref Range Status   Specimen Description   Final    EXPECTORATED SPUTUM Performed at [facility], [address], [place], [place] [address]    Special Requests   Final    NONE Reflexed from [id number] Performed at [facility], [address] [place], [place] [address]    Gram Stain   Final    FEW WBC PRESENT, PREDOMINANTLY PMN MODERATE GRAM POSITIVE COCCI FEW GRAM NEGATIVE RODS RARE GRAM POSITIVE RODS    Culture   Final    ABUNDANT STAPHYLOCOCCUS AUREUS SUSCEPTIBILITIES TO FOLLOW Performed at [facility], [address] [place], [place] [address]    Report Status PENDING  Incomplete  MRSA Next Gen by PCR, Nasal     Status: None   Collection Time: 09/11/23 11:11 PM   Specimen: Nasal Mucosa; Nasal Swab  Result Value Ref Range Status   MRSA by PCR Next Gen NOT DETECTED  NOT DETECTED Final    Comment: (NOTE) The GeneXpert MRSA Assay (FDA approved for NASAL specimens only), is one component of a comprehensive MRSA colonization surveillance program. It is not intended to diagnose MRSA infection nor to guide or monitor treatment for MRSA infections. Test performance is not FDA approved in patients less than 36 years old. Performed at [facility], [address], [place], [place] [address]          Radiology Studies: DG Swallowing Func-Speech Pathology Result Date: 09/12/2023 Table formatting from the original result was not included. Modified Barium Swallow Study Patient Details Name: CREASIE LACOSSE MRN: [id number] Date  of Birth: 03-29-1935 Today's Date: 09/12/2023 HPI/PMH: HPI: Per MD note "89 y.o. female with past medical history significant for CAD, type 2 diabetes mellitus, HTN, HLD, RLS, chronic back pain s/p lumbar laminectomy and cervical spinal fusion, stage IV lung cancer with bone metastasis, anxiety, osteoarthritis, ACDF surgery, GERD, hiatal hernia who presented to [facility] ED on 09/10/2023 with progressive shortness of breath, nonproductive cough, fever, chills, fatigue, malaise, myalgias and decreased appetite. Interstitial and airspace opacities within the right middle and right lower lobes, consistent with multifocal right-sided bronchopneumonia. 2. Grossly stable bilobed left lower lobe pulmonary mass, consistent with known history of lung cancer."  Swallow evaluation ordered as patient overtly coughing with ice chips overnight. Clinical Impression: Patient presents with moderate oropharyngeal dysphagia mostly c/b decreased oral coordination with oral transiting/mastication.  Premature spillage of boluses into pharynx noted with delay in swallow reflex resulting in penetration of puree, flash penetration of cracker and mild aspiration of thin/nectar.  Pt also with difficulty initiating swallow with attempts while  solids are halting in pharynx.  Use of pudding appeared to faciliate swallow trigger. Swallow triggered with pudding over epiglottis into larynx at approx 17 seconds - despite cues as pt continued with lingual pumping and struggling to initiate swallow. Mild pharyngeal retention then noted post-swallow at vallecular space. Chin tuck posture effective to prevent significant penetration of nectar. Both cued and reflexive cough is effective to clear aspirates and penetrates.    Pt also with retention in pharynx post-swallow due to decreased tongue base retraction, impaired epiglottic deflection and decreased laryngeal closure.    Pt repeatedly stated,  I don't have problems swallowing indicating decreased awareness.  Thin liquid penetration noted before the swallow as barium *and secretions* spilled to pyriform sinus and into larynx prior to swallow trigger with head neutral position. Head of bed reclined with slight neck extension appeared more protective of airway with thin liquids.  Posture allowed flash penetration of pudding as bolus spilled into open airway with swallow attempts (ongoing oral-lingual and pharyngeal movement attempts). Once pharyngeal swallow was triggered with pudding bolus reclined, she had only trace retention.   Solid cracker spilled into open larynx and halted here for approx 4 seconds prior to swallow initiation kicking bolus into pharynx with swallow with head neutral.  Pt with approx 50% bolus retention in pyriform sinus after swallow with cracker bolus without awareness.   Cues to organize bolus in mouth prior to swallow was not effective - as pt continued with premature spillage and delay to approx 11 seconds. And providing pudding bolus on top of solid did not improve timing of swallow and allowed frank penetration of pudding.   Thin liquid swallow with head neutral resulted in silent aspiration thin in mild amount.   Fortunately pt has a strong cough and can expectorate secretions and  barium throughout the study. Uncertain to cause of pt's oral control deficits and delay in her pharyngeal swallowing - given brain imaging has been negative.   Her primary aspiraiton risk appears to be with solids more than liquids.  At this time, recommend full liquid *nectar thick* w/chin tuck and tsps of thin water any time.  She may benefit from repeat when she is potentially ready for dietary advancement given her sensorimotor impairment.  Factors that may increase risk of adverse event in presence of aspiration Rubye Oaks & Clearance Coots 2021): Factors that may increase risk of adverse event in presence of aspiration Rubye Oaks & Clearance Coots 2021): Respiratory or GI disease; Limited mobility; Frail  or deconditioned Recommendations/Plan: Swallowing Evaluation Recommendations Swallowing Evaluation Recommendations Recommendations: PO diet PO Diet Recommendation: -- (full liquid diet , nectar and allow tsps of thin water) Supervision: Staff to assist with self-feeding Swallowing strategies  : Slow rate; Small bites/sips; Chin tuck (intermittent cough and expectorate prn) Postural changes: Position pt fully upright for meals Caregiver Recommendations: Have oral suction available Treatment Plan Treatment Plan Treatment recommendations: Therapy as outlined in treatment plan below Follow-up recommendations: Skilled nursing-short term rehab (<3 hours/day) Functional status assessment: Patient has had a recent decline in their functional status and demonstrates the ability to make significant improvements in function in a reasonable and predictable amount of time. Treatment frequency: Min 1x/week Treatment duration: 1 week Interventions: Aspiration precaution training; Patient/family education; Compensatory techniques; Oropharyngeal exercises; Trials of upgraded texture/liquids Recommendations Recommendations for follow up therapy are one component of a multi-disciplinary discharge planning process, led by the attending physician.   Recommendations may be updated based on patient status, additional functional criteria and insurance authorization. Assessment: Orofacial Exam: Orofacial Exam Oral Cavity: Oral Hygiene: Xerostomia Oral Cavity - Dentition: Dentures, top; Other (Comment); Adequate natural dentition Oral Motor/Sensory Function: WFL Anatomy: Anatomy: Presence of cervical hardware; Prominent cricopharyngeus Boluses Administered: Boluses Administered Boluses Administered: Thin liquids (Level 0); Mildly thick liquids (Level 2, nectar thick); Puree; Moderately thick liquids (Level 3, honey thick); Solid  Oral Impairment Domain: Oral Impairment Domain Lip Closure: Interlabial escape, no progression to anterior lip Tongue control during bolus hold: Escape to lateral buccal cavity/floor of mouth Bolus preparation/mastication: Disorganized chewing/mashing with solid pieces of bolus unchewed Bolus transport/lingual motion: Delayed initiation of tongue motion (oral holding) Oral residue: Trace residue lining oral structures Location of oral residue : Tongue Initiation of pharyngeal swallow : Posterior laryngeal surface of the epiglottis; Pyriform sinuses  Pharyngeal Impairment Domain: Pharyngeal Impairment Domain Soft palate elevation: No bolus between soft palate (SP)/pharyngeal wall (PW) Laryngeal elevation: Partial superior movement of thyroid cartilage/partial approximation of arytenoids to epiglottic petiole Anterior hyoid excursion: Partial anterior movement Epiglottic movement: Partial inversion Laryngeal vestibule closure: Incomplete, narrow column air/contrast in laryngeal vestibule Pharyngeal stripping wave : Present - diminished Pharyngeal contraction (A/P view only): Incomplete (pseudoviderticulae) Pharyngoesophageal segment opening: Partial distention/partial duration, partial obstruction of flow Tongue base retraction: Trace column of contrast or air between tongue base and PPW Pharyngeal residue: Trace residue within or on  pharyngeal structures Location of pharyngeal residue: Tongue base  Esophageal Impairment Domain: Esophageal Impairment Domain Esophageal clearance upright position: Esophageal retention Pill: Pill Consistency administered: -- (DNT) Penetration/Aspiration Scale Score: Penetration/Aspiration Scale Score 2.  Material enters airway, remains ABOVE vocal cords then ejected out: Solid 3.  Material enters airway, remains ABOVE vocal cords and not ejected out: Puree 8.  Material enters airway, passes BELOW cords without attempt by patient to eject out (silent aspiration) : Thin liquids (Level 0); Mildly thick liquids (Level 2, nectar thick) Compensatory Strategies: Compensatory Strategies Compensatory strategies: Yes Chin tuck: Effective; Ineffective (inconsistently effective) Effective Chin Tuck: Thin liquid (Level 0); Mildly thick liquid (Level 2, nectar thick); Puree; Solid Reclining posture: Effective Effective Reclining Posture: Thin liquid (Level 0) Other(comment): -- (cued cough and re-swallow)   General Information: Caregiver present: No  Diet Prior to this Study: NPO (x water and meds)   Temperature : Normal   Respiratory Status: WFL   Supplemental O2: High flow nasal cannula (4 liters)   History of Recent Intubation: No  Behavior/Cognition: Alert; Cooperative Self-Feeding Abilities: Needs assist with self-feeding Baseline vocal quality/speech: Hypophonia/low volume Volitional Cough: Able  to elicit Volitional Swallow: Able to elicit Exam Limitations: No limitations Goal Planning: Prognosis for improved oropharyngeal function: Guarded Barriers to Reach Goals: Time post onset; Overall medical prognosis No data recorded Patient/Family Stated Goal: none stated Consulted and agree with results and recommendations: Patient; Nurse Pain: Pain Assessment Pain Assessment: Faces Faces Pain Scale: 4 Facial Expression: 0 Body Movements: 0 Muscle Tension: 0 Compliance with ventilator (intubated pts.): N/A Vocalization (extubated  pts.): 0 CPOT Total: 0 Pain Location: hip - right Pain Descriptors / Indicators: Aching End of Session: Start Time:SLP Start Time (ACUTE ONLY): 1610 Stop Time: SLP Stop Time (ACUTE ONLY): 0920 Time Calculation:SLP Time Calculation (min) (ACUTE ONLY): 15 min Charges: SLP Evaluations $ SLP Speech Visit: 1 Visit SLP Evaluations $BSS Swallow: 1 Procedure SLP visit diagnosis: SLP Visit Diagnosis: Dysphagia, oropharyngeal phase (R13.12) Past Medical History: Past Medical History: Diagnosis Date  Aneurysm (HCC)   Right eye a non DES stent was placed so that she would not required long term dual antiplatelet therapy.  Anginal pain (HCC)   Anxiety   not currently taking any meds  Arthritis   CAD (coronary artery disease)   Diabetes mellitus without complication (HCC)   H/O hiatal hernia   Heart murmur   History of stress test 03/31/2012  The post stress myocardial perfusion images show a normal pattern of perfusion in all region. The post left ventricles is normal in size. There is no scintigraphic of inductible myocardial ischemia. The post EF is 95  Hx of echocardiogram 11/29/2010  Ef 67% Normal size chambes, Aortic valve sclerosis without stenosis, No other significant valvular abnormalities, No percardial effusion.  Hyperlipemia   Hypertension   Insulin resistance   Lung cancer (HCC)   Multiple thyroid nodules   Neuromuscular disorder (HCC)   nerve pain after shingles  Osteoporosis   Restless legs   Shingles  Past Surgical History: Past Surgical History: Procedure Laterality Date  ABDOMINAL HYSTERECTOMY  1970  ANGIOPLASTY    Stenting of a 90% eccentric right coronary artery stenosis and had a 3.0x15 mm Integrity bare-metal stent inserted  ANTERIOR CERVICAL DECOMP/DISCECTOMY FUSION N/A 07/30/2013  Procedure: ANTERIOR CERVICAL DECOMPRESSION/DISCECTOMY FUSION CERVICAL FIVE -SIX;  Surgeon: Tia Alert, MD;  Location: [facility];  Service: Neurosurgery;  Laterality: N/A;  APPENDECTOMY    BREAST SURGERY Left   cysts  removed from left breast (in her 20'2)  CARDIAC CATHETERIZATION    Showed a widely patent stent, she did have 60% ostial diagonal-1 stenosis, She also had mild luminal irregularities of her LAD.  COLONOSCOPY    EYE SURGERY Bilateral 2009  cateract surgery- bilateral  IR IMAGING GUIDED PORT INSERTION  06/04/2019  LAMINECTOMY WITH POSTERIOR LATERAL ARTHRODESIS LEVEL 2 Right 07/18/2021  Procedure: Laminectomy and Foraminotomy - Lumbar four-Lumbar five - Lumbar five-Sacral one- right, posterolateral fusion Lumbar four-five with interspinous plate;  Surgeon: Tia Alert, MD;  Location: [facility];  Service: Neurosurgery;  Laterality: Right;  LEFT HEART CATH AND CORONARY ANGIOGRAPHY N/A 11/15/2021  Procedure: LEFT HEART CATH AND CORONARY ANGIOGRAPHY;  Surgeon: Lennette Bihari, MD;  Location: [facility]VAS[facility] CV [facility];  Service: Cardiovascular;  Laterality: N/A;  REFRACTIVE SURGERY Left 2011  TONSILLECTOMY   Rolena Infante, MS [facility] SLP Acute Rehab Services Office [phone] Chales Abrahams 09/12/2023, 2:02 PM       Scheduled Meds:  fentaNYL  1 patch Transdermal Q72H   fentaNYL  1 patch Transdermal Q72H   sodium chloride flush  10-40 mL Intracatheter Q12H   Continuous Infusions:  LOS: 3 days    Time spent: 54 minutes spent on chart review, discussion with nursing staff, consultants, updating family and interview/physical exam; more than 50% of that time was spent in counseling and/or coordination of care.    Alvira Philips [place], DO [facility] Available via Epic secure chat 7am-7pm After these hours, please refer to coverage provider listed on [email or web address] 09/13/2023, 10:56 AM

## 2023-09-13 NOTE — Progress Notes (Signed)
RN reached out to Dr [place] this AM about several concerns overnight. Upon assessment, patient was fully oriented this AM, expressing to RN that she was "tired and [just] wants to die". She was very tearful and was moaning in her room if left alone at any time. Overnight her HR intermittently dipped into 30'3-40's, and once even 20's after a pause. Nightshift RN had held several meds to prevent further decline in HR, but patient remained in pain with anxiety when awake due to limitation of medication given code status.   Dr [place] examined patient at bedside first thing after shift change and called her daughter to discuss the decision to transition to comfort care given her decline in status and her wishes. Patient is currently resting comfortably with her daughter [name] at bedside. RN will continue to assess patient for signs of discomfort, and offer support to patient and family.

## 2023-09-13 NOTE — Evaluation (Signed)
SLP Cancellation Note  Patient Details Name: [name] MRN: [id number] DOB: October 01, 1934   Cancelled treatment:       Reason Eval/Treat Not Completed: Other (comment) (pt now comfort care - will sign off)  Rolena Infante, MS [facility] SLP Acute Rehab Services Office [phone]  Chales Abrahams 09/13/2023, 9:41 AM

## 2023-09-13 NOTE — Plan of Care (Signed)
  Problem: Education: Goal: Knowledge of risk factors and measures for prevention of condition will improve Outcome: Progressing   Problem: Coping: Goal: Psychosocial and spiritual needs will be supported Outcome: Progressing   Problem: Respiratory: Goal: Will maintain a patent airway Outcome: Progressing Goal: Complications related to the disease process, condition or treatment will be avoided or minimized Outcome: Progressing   Problem: Education: Goal: Ability to describe self-care measures that may prevent or decrease complications (Diabetes Survival Skills Education) will improve Outcome: Progressing Goal: Individualized Educational Video(s) Outcome: Progressing   Problem: Coping: Goal: Ability to adjust to condition or change in health will improve Outcome: Progressing   Problem: Fluid Volume: Goal: Ability to maintain a balanced intake and output will improve Outcome: Progressing   Problem: Health Behavior/Discharge Planning: Goal: Ability to identify and utilize available resources and services will improve Outcome: Progressing Goal: Ability to manage health-related needs will improve Outcome: Progressing   Problem: Metabolic: Goal: Ability to maintain appropriate glucose levels will improve Outcome: Progressing   Problem: Nutritional: Goal: Maintenance of adequate nutrition will improve Outcome: Progressing Goal: Progress toward achieving an optimal weight will improve Outcome: Progressing   Problem: Skin Integrity: Goal: Risk for impaired skin integrity will decrease Outcome: Progressing   Problem: Tissue Perfusion: Goal: Adequacy of tissue perfusion will improve Outcome: Progressing   Problem: Education: Goal: Knowledge of General Education information will improve Description: Including pain rating scale, medication(s)/side effects and non-pharmacologic comfort measures Outcome: Progressing   Problem: Health Behavior/Discharge Planning: Goal:  Ability to manage health-related needs will improve Outcome: Progressing   Problem: Clinical Measurements: Goal: Ability to maintain clinical measurements within normal limits will improve Outcome: Progressing Goal: Will remain free from infection Outcome: Progressing Goal: Diagnostic test results will improve Outcome: Progressing Goal: Respiratory complications will improve Outcome: Progressing Goal: Cardiovascular complication will be avoided Outcome: Progressing   Problem: Activity: Goal: Risk for activity intolerance will decrease Outcome: Progressing   Problem: Nutrition: Goal: Adequate nutrition will be maintained Outcome: Progressing   Problem: Coping: Goal: Level of anxiety will decrease Outcome: Progressing   Problem: Elimination: Goal: Will not experience complications related to bowel motility Outcome: Progressing Goal: Will not experience complications related to urinary retention Outcome: Progressing   Problem: Pain Managment: Goal: General experience of comfort will improve and/or be controlled Outcome: Progressing   Problem: Safety: Goal: Ability to remain free from injury will improve Outcome: Progressing   Problem: Skin Integrity: Goal: Risk for impaired skin integrity will decrease Outcome: Progressing   Problem: Education: Goal: Knowledge of disease or condition will improve Outcome: Progressing Goal: Knowledge of the prescribed therapeutic regimen will improve Outcome: Progressing Goal: Individualized Educational Video(s) Outcome: Progressing   Problem: Activity: Goal: Ability to tolerate increased activity will improve Outcome: Progressing Goal: Will verbalize the importance of balancing activity with adequate rest periods Outcome: Progressing   Problem: Respiratory: Goal: Ability to maintain a clear airway will improve Outcome: Progressing Goal: Levels of oxygenation will improve Outcome: Progressing Goal: Ability to maintain  adequate ventilation will improve Outcome: Progressing   Problem: Activity: Goal: Ability to tolerate increased activity will improve Outcome: Progressing   Problem: Clinical Measurements: Goal: Ability to maintain a body temperature in the normal range will improve Outcome: Progressing   Problem: Respiratory: Goal: Ability to maintain adequate ventilation will improve Outcome: Progressing Goal: Ability to maintain a clear airway will improve Outcome: Progressing

## 2023-09-13 NOTE — Progress Notes (Signed)
  Daily Progress Note   Patient Name: [name]       Date: 09/13/2023 DOB: 1934-09-09  Age: 89 y.o. MRN#: [id number] Attending Physician: [place], Eric J, DO Primary Care Physician: Garlan Fillers, MD Admit Date: 09/10/2023 Length of Stay: 3 days  Discussed care with primary hospitalist today. Hospitalist has already discussed goals for medical care with patient and family today. Patient transitioned to comfort focused care. After discussion with hospitalist, no PMT needs noted at this time and so consult will be canceled. Please reach out if acute needs arise. Thank you.  Alvester Morin, DO Palliative Care Provider PMT # [phone]

## 2023-09-13 NOTE — Plan of Care (Signed)
  Problem: Coping: Goal: Ability to adjust to condition or change in health will improve Outcome: Progressing   Problem: Coping: Goal: Level of anxiety will decrease Outcome: Progressing

## 2023-09-13 NOTE — Progress Notes (Signed)
PT Cancellation Note  Patient Details Name: [name] MRN: [id number] DOB: 04/09/1935   Cancelled Treatment:    Reason Eval/Treat Not Completed: Other (comment)Patient is now  Comfort Care, will sign off. Blanchard Kelch PT Acute Rehabilitation Services Office [phone] Weekend [phone]    Rada Hay 09/13/2023, 11:09 AM

## 2023-09-13 NOTE — TOC Transition Note (Signed)
Transition of Care [facility]) - Discharge Note  Patient Details  Name: [name] MRN: [id number] Date of Birth: August 23, 1934  [facility] CM/SW Contact:  Ewing Schlein, LCSW Phone Number: 09/13/2023, 1:38 PM  Clinical Narrative: [facility] consulted for residential hospice. CSW spoke with patient's daughter, Yaakov Guthrie, regarding residential hospice. However, patient does not appear to be able to tolerate transport. Anticipating hospital death at this time.  Final next level of care:  (Anticipating hospital death) Barriers to Discharge: No Barriers Identified  Patient Goals and CMS Choice Patient states their goals for this hospitalization and ongoing recovery are:: Anticipating hospital death CMS Medicare.gov Compare Post Acute Care list provided to:: Patient Represenative (must comment) Choice offered to / list presented to : Adult Children [place] ownership interest in [facility].provided to:: Adult Children   Discharge Plan and Services Additional resources added to the After Visit Summary for   Discharge Planning Services: CM Consult Post Acute Care Choice: Resumption of Svcs/PTA Provider          DME Arranged: N/A DME Agency: NA  Social Drivers of Health (SDOH) Interventions SDOH Screenings   Food Insecurity: No Food Insecurity (09/11/2023)  Housing: Low Risk  (09/11/2023)  Transportation Needs: No Transportation Needs (09/11/2023)  Utilities: Not At Risk (09/11/2023)  Social Connections: Unknown (09/11/2023)  Tobacco Use: Medium Risk (09/10/2023)   Readmission Risk Interventions     No data to display

## 2023-09-14 DIAGNOSIS — J9601 Acute respiratory failure with hypoxia: Secondary | ICD-10-CM | POA: Diagnosis not present

## 2023-09-20 ENCOUNTER — Ambulatory Visit: Payer: HMO

## 2023-09-20 ENCOUNTER — Other Ambulatory Visit: Payer: HMO

## 2023-09-20 ENCOUNTER — Ambulatory Visit: Payer: HMO | Admitting: Oncology

## 2023-09-29 NOTE — Plan of Care (Signed)
Comfort care ongoing;  prns given for pain, anxiety and secretions.  3 wishes project initiated with pt and daughter, including multiple fingerprints done for grandchildren and great grandchildren. Problem: Education: Goal: Knowledge of risk factors and measures for prevention of condition will improve Outcome: Progressing   Problem: Coping: Goal: Psychosocial and spiritual needs will be supported Outcome: Progressing   Problem: Respiratory: Goal: Will maintain a patent airway Outcome: Progressing Goal: Complications related to the disease process, condition or treatment will be avoided or minimized Outcome: Progressing   Problem: Education: Goal: Ability to describe self-care measures that may prevent or decrease complications (Diabetes Survival Skills Education) will improve Outcome: Progressing Goal: Individualized Educational Video(s) Outcome: Progressing   Problem: Coping: Goal: Ability to adjust to condition or change in health will improve Outcome: Progressing   Problem: Fluid Volume: Goal: Ability to maintain a balanced intake and output will improve Outcome: Progressing   Problem: Health Behavior/Discharge Planning: Goal: Ability to identify and utilize available resources and services will improve Outcome: Progressing Goal: Ability to manage health-related needs will improve Outcome: Progressing   Problem: Metabolic: Goal: Ability to maintain appropriate glucose levels will improve Outcome: Progressing   Problem: Nutritional: Goal: Maintenance of adequate nutrition will improve Outcome: Progressing Goal: Progress toward achieving an optimal weight will improve Outcome: Progressing   Problem: Skin Integrity: Goal: Risk for impaired skin integrity will decrease Outcome: Progressing   Problem: Tissue Perfusion: Goal: Adequacy of tissue perfusion will improve Outcome: Progressing   Problem: Education: Goal: Knowledge of General Education information will  improve Description: Including pain rating scale, medication(s)/side effects and non-pharmacologic comfort measures Outcome: Progressing   Problem: Health Behavior/Discharge Planning: Goal: Ability to manage health-related needs will improve Outcome: Progressing   Problem: Clinical Measurements: Goal: Ability to maintain clinical measurements within normal limits will improve Outcome: Progressing Goal: Will remain free from infection Outcome: Progressing Goal: Diagnostic test results will improve Outcome: Progressing Goal: Respiratory complications will improve Outcome: Progressing Goal: Cardiovascular complication will be avoided Outcome: Progressing   Problem: Activity: Goal: Risk for activity intolerance will decrease Outcome: Progressing   Problem: Nutrition: Goal: Adequate nutrition will be maintained Outcome: Progressing   Problem: Coping: Goal: Level of anxiety will decrease Outcome: Progressing   Problem: Elimination: Goal: Will not experience complications related to bowel motility Outcome: Progressing Goal: Will not experience complications related to urinary retention Outcome: Progressing   Problem: Pain Managment: Goal: General experience of comfort will improve and/or be controlled Outcome: Progressing   Problem: Safety: Goal: Ability to remain free from injury will improve Outcome: Progressing   Problem: Skin Integrity: Goal: Risk for impaired skin integrity will decrease Outcome: Progressing   Problem: Education: Goal: Knowledge of disease or condition will improve Outcome: Progressing Goal: Knowledge of the prescribed therapeutic regimen will improve Outcome: Progressing Goal: Individualized Educational Video(s) Outcome: Progressing   Problem: Activity: Goal: Ability to tolerate increased activity will improve Outcome: Progressing Goal: Will verbalize the importance of balancing activity with adequate rest periods Outcome: Progressing    Problem: Respiratory: Goal: Ability to maintain a clear airway will improve Outcome: Progressing Goal: Levels of oxygenation will improve Outcome: Progressing Goal: Ability to maintain adequate ventilation will improve Outcome: Progressing   Problem: Activity: Goal: Ability to tolerate increased activity will improve Outcome: Progressing   Problem: Clinical Measurements: Goal: Ability to maintain a body temperature in the normal range will improve Outcome: Progressing   Problem: Respiratory: Goal: Ability to maintain adequate ventilation will improve Outcome: Progressing Goal: Ability to maintain a  clear airway will improve Outcome: Progressing   Problem: Education: Goal: Knowledge of the prescribed therapeutic regimen will improve Outcome: Progressing   Problem: Coping: Goal: Ability to identify and develop effective coping behavior will improve Outcome: Progressing   Problem: Clinical Measurements: Goal: Quality of life will improve Outcome: Progressing

## 2023-09-29 NOTE — Death Summary Note (Signed)
DEATH SUMMARY   Patient Details  Name: [name] MRN: [id number] DOB: April 16, 1935 [id number], Tina Dienes, MD Admission/Discharge Information   Admit Date:  September 14, 2023  Date of Death: Date of Death: Sep 18, 2023  Time of Death: Time of Death: 1108/09/20  Length of Stay: 4   Principle Cause of death: Multi organ failure, acute respiratory tori failure multifocal pneumonia, AKI on CKD stage IIIb  Hospital Diagnoses: Principal Problem:   Acute respiratory failure with hypoxia (HCC) Active Problems:   Coronary artery disease with exertional angina (HCC)   Restless leg syndrome   Essential hypertension   Hyperlipidemia with target LDL less than 70   S/P cervical spinal fusion   Type 2 diabetes mellitus (HCC)   Lung cancer, primary, with metastasis from lung to other site, left (HCC)   S/P lumbar laminectomy   Metastasis to bone (HCC)   AKI (acute kidney injury) (HCC)   Hypermagnesemia   Hyperphosphatemia   Hyponatremia   Moderate protein malnutrition (HCC)   Normocytic anemia   Thrombocytopenia (HCC)   Elevated troponin   Elevated brain natriuretic peptide (BNP) level   Hypocarbia   Hypocalcemia   COVID-19 virus infection   Compensated metabolic acidosis   Atelectasis   Rhabdomyolysis   Dehydration   Diarrhea due to COVID-19   Positive D dimer   Hospital Course:  89 year old female patient with past medical history significant for stage IV lung cancer with bony metastasis with chronic pain, CAD, type 2 diabetes, HTN, HLD, [facility] with increasing shortness of breath productive cough was found to be positive for COVID-19 infection, despite treated with 4 days of molnupiravir and antibiotics, patient condition not improving with worsening of hypoxia CT chest showed interstitial space opacity with right middle lobe and lower lobe consistent with multifocal bronchopneumonia.  Subsequently patient developed multiorgan failure including AKI  with significant oligoanuria, and worsening of hypoxia despite treated with antibiotics as well as acute encephalopathy.  Eventually, family decided patient go on hospice, given the complexity and severity of patient's condition and quick deterioration despite maximized treatment, and meaningful recovery appear to be minimum.  Patient was placed on hospice care on 2/13 and possibly on 11:10 Sep 18, 2023.  Family at bedside.        Procedures: None  Consultations: None  The results of significant diagnostics from this hospitalization (including imaging, microbiology, ancillary and laboratory) are listed below for reference.   Significant Diagnostic Studies: DG Swallowing Func-Speech Pathology Result Date: 09/12/2023 Table formatting from the original result was not included. Modified Barium Swallow Study Patient Details Name: [name] MRN: [id number] Date of Birth: [birth year] Today's Date: 09/12/2023 HPI/PMH: HPI: Per MD note "89 y.o. female with past medical history significant for CAD, type 2 diabetes mellitus, HTN, HLD, RLS, chronic back pain s/p lumbar laminectomy and cervical spinal fusion, stage IV lung cancer with bone metastasis, anxiety, osteoarthritis, ACDF surgery, GERD, hiatal hernia who presented to [facility] ED on September 14, 2023 with progressive shortness of breath, nonproductive cough, fever, chills, fatigue, malaise, myalgias and decreased appetite. Interstitial and airspace opacities within the right middle and right lower lobes, consistent with multifocal right-sided bronchopneumonia. 2. Grossly stable bilobed left lower lobe pulmonary mass, consistent with known history of lung cancer."  Swallow evaluation ordered as patient overtly coughing with ice chips overnight. Clinical Impression: Patient presents with moderate oropharyngeal dysphagia mostly c/b decreased oral coordination with oral transiting/mastication.  Premature spillage of boluses into pharynx  noted with delay  in swallow reflex resulting in penetration of puree, flash penetration of cracker and mild aspiration of thin/nectar.  Pt also with difficulty initiating swallow with attempts while solids are halting in pharynx.  Use of pudding appeared to faciliate swallow trigger. Swallow triggered with pudding over epiglottis into larynx at approx 17 seconds - despite cues as pt continued with lingual pumping and struggling to initiate swallow. Mild pharyngeal retention then noted post-swallow at vallecular space. Chin tuck posture effective to prevent significant penetration of nectar. Both cued and reflexive cough is effective to clear aspirates and penetrates.    Pt also with retention in pharynx post-swallow due to decreased tongue base retraction, impaired epiglottic deflection and decreased laryngeal closure.    Pt repeatedly stated,  I don't have problems swallowing indicating decreased awareness.  Thin liquid penetration noted before the swallow as barium *and secretions* spilled to pyriform sinus and into larynx prior to swallow trigger with head neutral position. Head of bed reclined with slight neck extension appeared more protective of airway with thin liquids.  Posture allowed flash penetration of pudding as bolus spilled into open airway with swallow attempts (ongoing oral-lingual and pharyngeal movement attempts). Once pharyngeal swallow was triggered with pudding bolus reclined, she had only trace retention.   Solid cracker spilled into open larynx and halted here for approx 4 seconds prior to swallow initiation kicking bolus into pharynx with swallow with head neutral.  Pt with approx 50% bolus retention in pyriform sinus after swallow with cracker bolus without awareness.   Cues to organize bolus in mouth prior to swallow was not effective - as pt continued with premature spillage and delay to approx 11 seconds. And providing pudding bolus on top of solid did not improve timing of swallow and  allowed frank penetration of pudding.   Thin liquid swallow with head neutral resulted in silent aspiration thin in mild amount.   Fortunately pt has a strong cough and can expectorate secretions and barium throughout the study. Uncertain to cause of pt's oral control deficits and delay in her pharyngeal swallowing - given brain imaging has been negative.   Her primary aspiraiton risk appears to be with solids more than liquids.  At this time, recommend full liquid *nectar thick* w/chin tuck and tsps of thin water any time.  She may benefit from repeat when she is potentially ready for dietary advancement given her sensorimotor impairment.  Factors that may increase risk of adverse event in presence of aspiration Rubye Oaks & Clearance Coots 2021): Factors that may increase risk of adverse event in presence of aspiration Rubye Oaks & Clearance Coots 2021): Respiratory or GI disease; Limited mobility; Frail or deconditioned Recommendations/Plan: Swallowing Evaluation Recommendations Swallowing Evaluation Recommendations Recommendations: PO diet PO Diet Recommendation: -- (full liquid diet , nectar and allow tsps of thin water) Supervision: Staff to assist with self-feeding Swallowing strategies  : Slow rate; Small bites/sips; Chin tuck (intermittent cough and expectorate prn) Postural changes: Position pt fully upright for meals Caregiver Recommendations: Have oral suction available Treatment Plan Treatment Plan Treatment recommendations: Therapy as outlined in treatment plan below Follow-up recommendations: Skilled nursing-short term rehab (<3 hours/day) Functional status assessment: Patient has had a recent decline in their functional status and demonstrates the ability to make significant improvements in function in a reasonable and predictable amount of time. Treatment frequency: Min 1x/week Treatment duration: 1 week Interventions: Aspiration precaution training; Patient/family education; Compensatory techniques; Oropharyngeal  exercises; Trials of upgraded texture/liquids Recommendations Recommendations for follow up therapy are one component of a  multi-disciplinary discharge planning process, led by the attending physician.  Recommendations may be updated based on patient status, additional functional criteria and insurance authorization. Assessment: Orofacial Exam: Orofacial Exam Oral Cavity: Oral Hygiene: Xerostomia Oral Cavity - Dentition: Dentures, top; Other (Comment); Adequate natural dentition Oral Motor/Sensory Function: WFL Anatomy: Anatomy: Presence of cervical hardware; Prominent cricopharyngeus Boluses Administered: Boluses Administered Boluses Administered: Thin liquids (Level 0); Mildly thick liquids (Level 2, nectar thick); Puree; Moderately thick liquids (Level 3, honey thick); Solid  Oral Impairment Domain: Oral Impairment Domain Lip Closure: Interlabial escape, no progression to anterior lip Tongue control during bolus hold: Escape to lateral buccal cavity/floor of mouth Bolus preparation/mastication: Disorganized chewing/mashing with solid pieces of bolus unchewed Bolus transport/lingual motion: Delayed initiation of tongue motion (oral holding) Oral residue: Trace residue lining oral structures Location of oral residue : Tongue Initiation of pharyngeal swallow : Posterior laryngeal surface of the epiglottis; Pyriform sinuses  Pharyngeal Impairment Domain: Pharyngeal Impairment Domain Soft palate elevation: No bolus between soft palate (SP)/pharyngeal wall (PW) Laryngeal elevation: Partial superior movement of thyroid cartilage/partial approximation of arytenoids to epiglottic petiole Anterior hyoid excursion: Partial anterior movement Epiglottic movement: Partial inversion Laryngeal vestibule closure: Incomplete, narrow column air/contrast in laryngeal vestibule Pharyngeal stripping wave : Present - diminished Pharyngeal contraction (A/P view only): Incomplete (pseudoviderticulae) Pharyngoesophageal segment opening:  Partial distention/partial duration, partial obstruction of flow Tongue base retraction: Trace column of contrast or air between tongue base and PPW Pharyngeal residue: Trace residue within or on pharyngeal structures Location of pharyngeal residue: Tongue base  Esophageal Impairment Domain: Esophageal Impairment Domain Esophageal clearance upright position: Esophageal retention Pill: Pill Consistency administered: -- (DNT) Penetration/Aspiration Scale Score: Penetration/Aspiration Scale Score 2.  Material enters airway, remains ABOVE vocal cords then ejected out: Solid 3.  Material enters airway, remains ABOVE vocal cords and not ejected out: Puree 8.  Material enters airway, passes BELOW cords without attempt by patient to eject out (silent aspiration) : Thin liquids (Level 0); Mildly thick liquids (Level 2, nectar thick) Compensatory Strategies: Compensatory Strategies Compensatory strategies: Yes Chin tuck: Effective; Ineffective (inconsistently effective) Effective Chin Tuck: Thin liquid (Level 0); Mildly thick liquid (Level 2, nectar thick); Puree; Solid Reclining posture: Effective Effective Reclining Posture: Thin liquid (Level 0) Other(comment): -- (cued cough and re-swallow)   General Information: Caregiver present: No  Diet Prior to this Study: NPO (x water and meds)   Temperature : Normal   Respiratory Status: WFL   Supplemental O2: High flow nasal cannula (4 liters)   History of Recent Intubation: No  Behavior/Cognition: Alert; Cooperative Self-Feeding Abilities: Needs assist with self-feeding Baseline vocal quality/speech: Hypophonia/low volume Volitional Cough: Able to elicit Volitional Swallow: Able to elicit Exam Limitations: No limitations Goal Planning: Prognosis for improved oropharyngeal function: Guarded Barriers to Reach Goals: Time post onset; Overall medical prognosis No data recorded Patient/Family Stated Goal: none stated Consulted and agree with results and recommendations: Patient; Nurse  Pain: Pain Assessment Pain Assessment: Faces Faces Pain Scale: 4 Facial Expression: 0 Body Movements: 0 Muscle Tension: 0 Compliance with ventilator (intubated pts.): N/A Vocalization (extubated pts.): 0 CPOT Total: 0 Pain Location: hip - right Pain Descriptors / Indicators: Aching End of Session: Start Time:SLP Start Time (ACUTE ONLY): 0981 Stop Time: SLP Stop Time (ACUTE ONLY): 0920 Time Calculation:SLP Time Calculation (min) (ACUTE ONLY): 15 min Charges: SLP Evaluations $ SLP Speech Visit: 1 Visit SLP Evaluations $BSS Swallow: 1 Procedure SLP visit diagnosis: SLP Visit Diagnosis: Dysphagia, oropharyngeal phase (R13.12) Past Medical History: Past Medical History: Diagnosis  Date  Aneurysm (HCC)   Right eye a non DES stent was placed so that she would not required long term dual antiplatelet therapy.  Anginal pain (HCC)   Anxiety   not currently taking any meds  Arthritis   CAD (coronary artery disease)   Diabetes mellitus without complication (HCC)   H/O hiatal hernia   Heart murmur   History of stress test 03/31/2012  The post stress myocardial perfusion images show a normal pattern of perfusion in all region. The post left ventricles is normal in size. There is no scintigraphic of inductible myocardial ischemia. The post EF is 69  Hx of echocardiogram 11/29/2010  Ef 67% Normal size chambes, Aortic valve sclerosis without stenosis, No other significant valvular abnormalities, No percardial effusion.  Hyperlipemia   Hypertension   Insulin resistance   Lung cancer (HCC)   Multiple thyroid nodules   Neuromuscular disorder (HCC)   nerve pain after shingles  Osteoporosis   Restless legs   Shingles  Past Surgical History: Past Surgical History: Procedure Laterality Date  ABDOMINAL HYSTERECTOMY  1970  ANGIOPLASTY    Stenting of a 90% eccentric right coronary artery stenosis and had a 3.0x15 mm Integrity bare-metal stent inserted  ANTERIOR CERVICAL DECOMP/DISCECTOMY FUSION N/A 07/30/2013  Procedure: ANTERIOR CERVICAL  DECOMPRESSION/DISCECTOMY FUSION CERVICAL FIVE -SIX;  Surgeon: Tia Alert, MD;  Location: [facility] OR[facility];  Service: Neurosurgery;  Laterality: N/A;  APPENDECTOMY    BREAST SURGERY Left   cysts removed from left breast (in her 20'2)  CARDIAC CATHETERIZATION    Showed a widely patent stent, she did have 60% ostial diagonal-1 stenosis, She also had mild luminal irregularities of her LAD.  COLONOSCOPY    EYE SURGERY Bilateral 2009  cateract surgery- bilateral  IR IMAGING GUIDED PORT INSERTION  06/04/2019  LAMINECTOMY WITH POSTERIOR LATERAL ARTHRODESIS LEVEL 2 Right 07/18/2021  Procedure: Laminectomy and Foraminotomy - Lumbar four-Lumbar five - Lumbar five-Sacral one- right, posterolateral fusion Lumbar four-five with interspinous plate;  Surgeon: Tia Alert, MD;  Location: [facility] OR;  Service: Neurosurgery;  Laterality: Right;  LEFT HEART CATH AND CORONARY ANGIOGRAPHY N/A 11/15/2021  Procedure: LEFT HEART CATH AND CORONARY ANGIOGRAPHY;  Surgeon: Lennette Bihari, MD;  Location: [facility] INVASIVE CV LAB;  Service: Cardiovascular;  Laterality: N/A;  REFRACTIVE SURGERY Left 2011  TONSILLECTOMY   Rolena Infante, MS [facility] SLP Acute Rehab Services Office [phone] Chales Abrahams 09/12/2023, 2:02 PM  CT CHEST WO CONTRAST Result Date: 09/10/2023 CLINICAL DATA:  COVID-19 positive, hypoxia, left lower lobe non-small cell lung cancer EXAM: CT CHEST WITHOUT CONTRAST TECHNIQUE: Multidetector CT imaging of the chest was performed following the standard protocol without IV contrast. RADIATION DOSE REDUCTION: This exam was performed according to the departmental dose-optimization program which includes automated exposure control, adjustment of the mA and/or kV according to patient size and/or use of iterative reconstruction technique. COMPARISON:  09/10/2023, 07/13/2023, 10/20/2021 FINDINGS: Cardiovascular: Unenhanced imaging of the heart is unremarkable without pericardial effusion. Normal caliber of the thoracic aorta. Atherosclerosis of the  aorta and coronary vasculature. Evaluation of the vascular lumen is limited without IV contrast. Right chest wall port via internal jugular approach, tip within the superior vena cava. Mediastinum/Nodes: No enlarged mediastinal or axillary lymph nodes. Thyroid gland, trachea, and esophagus demonstrate no significant findings. Lungs/Pleura: Bilobed left lower lobe pulmonary mass is again identified, reference image 81/5 measuring 3.6 x 2.5 cm. This previously measured 3.6 x 2.7 cm by my measurement on PET scan 07/13/2023. Increased interstitial prominence and patchy  consolidation within the right middle and right lower lobes consistent with multifocal right-sided bronchopneumonia. This is consistent with patient's given history of COVID. There are trace bilateral pleural effusions. Stable areas of subpleural scarring, greatest at the lung bases. Central airways are patent. Upper Abdomen: No acute abnormality. Musculoskeletal: Subacute healing right posterolateral tenth rib fracture. No other acute bony abnormalities. Reconstructed images demonstrate no additional findings. IMPRESSION: 1. Interstitial and airspace opacities within the right middle and right lower lobes, consistent with multifocal right-sided bronchopneumonia. 2. Grossly stable bilobed left lower lobe pulmonary mass, consistent with known history of lung cancer. 3. Trace bilateral pleural effusions. 4. Aortic Atherosclerosis (ICD10-I70.0). Coronary artery atherosclerosis. Electronically Signed   By: Sharlet Salina M.D.   On: 09/10/2023 16:22   US RENAL Result Date: 09/10/2023 CLINICAL DATA:  [id number] with acute kidney injury. EXAM: RENAL / URINARY TRACT ULTRASOUND COMPLETE COMPARISON:  PET-CT 07/13/2023 FINDINGS: Right Kidney: Renal measurements: 11.9 x 5.4 x 6.5 cm = volume: 217.5 mL. Echogenicity is mildly increased. No mass, stones or hydronephrosis visualized. Left Kidney: Renal measurements: 12.5 x 6.4 x 5.1 cm = volume: 214.7 mL. Echogenicity is  mildly increased. No mass, stones or hydronephrosis visualized. Bladder: Appears normal for degree of bladder distention, both ureteral jets are not visualized during the study. Other: No free fluid. IMPRESSION: Mildly increased renal echogenicity bilaterally consistent with medical renal disease. No hydronephrosis. Electronically Signed   By: Almira Bar M.D.   On: 09/10/2023 07:48   DG Chest Port 1 View Result Date: 09/10/2023 CLINICAL DATA:  Recent history of COVID, presenting with hypoxia. EXAM: PORTABLE CHEST 1 VIEW COMPARISON:  October 20, 2021 FINDINGS: There is stable right-sided venous Port-A-Cath positioning. The heart size and mediastinal contours are within normal limits. A 1.5 cm diameter mildly opaque area is seen overlying the right lung base. The periphery of the right lung base. Mild areas of bibasilar atelectasis are also seen. No pleural effusion or pneumothorax is identified. A radiopaque fusion plate and screws are seen overlying the cervical spine. No acute osseous abnormalities are identified. IMPRESSION: 1. Mild bibasilar atelectasis. 2. 1.5 cm diameter mildly opaque area overlying the right lung base which may represent a nipple shadow. Correlation with follow-up chest plain film with nipple markers is recommended to exclude the presence of a pulmonary nodule. Electronically Signed   By: Aram Candela M.D.   On: 09/10/2023 02:54    Microbiology: Recent Results (from the past 240 hours)  Resp panel by RT-PCR (RSV, Flu A&B, Covid) Anterior Nasal Swab     Status: Abnormal   Collection Time: 09/10/23  2:08 AM   Specimen: Anterior Nasal Swab  Result Value Ref Range Status   SARS Coronavirus 2 by RT PCR POSITIVE (A) NEGATIVE Final    Comment: (NOTE) SARS-CoV-2 target nucleic acids are DETECTED.  The SARS-CoV-2 RNA is generally detectable in upper respiratory specimens during the acute phase of infection. Positive results are indicative of the presence of the identified  virus, but do not rule out bacterial infection or co-infection with other pathogens not detected by the test. Clinical correlation with patient history and other diagnostic information is necessary to determine patient infection status. The expected result is Negative.  Fact Sheet for Patients: BloggerCourse.com  Fact Sheet for Healthcare Providers: SeriousBroker.it  This test is not yet approved or cleared by the [place] FDA and  has been authorized for detection and/or diagnosis of SARS-CoV-2 by FDA under an Emergency Use Authorization (EUA).  This EUA will remain  in effect (meaning this test can be used) for the duration of  the COVID-19 declaration under Section 564(b)(1) of the A ct, 21 U.S.C. section 360bbb-3(b)(1), unless the authorization is terminated or revoked sooner.     Influenza A by PCR NEGATIVE NEGATIVE Final   Influenza B by PCR NEGATIVE NEGATIVE Final    Comment: (NOTE) The Xpert Xpress SARS-CoV-2/FLU/RSV plus assay is intended as an aid in the diagnosis of influenza from Nasopharyngeal swab specimens and should not be used as a sole basis for treatment. Nasal washings and aspirates are unacceptable for Xpert Xpress SARS-CoV-2/FLU/RSV testing.  Fact Sheet for Patients: BloggerCourse.com  Fact Sheet for Healthcare Providers: SeriousBroker.it  This test is not yet approved or cleared by the [place] FDA and has been authorized for detection and/or diagnosis of SARS-CoV-2 by FDA under an Emergency Use Authorization (EUA). This EUA will remain in effect (meaning this test can be used) for the duration of the COVID-19 declaration under Section 564(b)(1) of the Act, 21 U.S.C. section 360bbb-3(b)(1), unless the authorization is terminated or revoked.     Resp Syncytial Virus by PCR NEGATIVE NEGATIVE Final    Comment: (NOTE) Fact Sheet for  Patients: BloggerCourse.com  Fact Sheet for Healthcare Providers: SeriousBroker.it  This test is not yet approved or cleared by the [place] FDA and has been authorized for detection and/or diagnosis of SARS-CoV-2 by FDA under an Emergency Use Authorization (EUA). This EUA will remain in effect (meaning this test can be used) for the duration of the COVID-19 declaration under Section 564(b)(1) of the Act, 21 U.S.C. section 360bbb-3(b)(1), unless the authorization is terminated or revoked.  Performed at [facility], [address] [place], [place] [address]   Expectorated Sputum Assessment w Gram Stain, Rflx to Resp Cult     Status: None   Collection Time: 09/10/23  5:32 PM   Specimen: Expectorated Sputum  Result Value Ref Range Status   Specimen Description EXPECTORATED SPUTUM  Final   Special Requests NONE  Final   Sputum evaluation   Final    THIS SPECIMEN IS ACCEPTABLE FOR SPUTUM CULTURE Performed at [facility], [address] [place], [place] [address]    Report Status 09/10/2023 FINAL  Final  Culture, Respiratory w Gram Stain     Status: None   Collection Time: 09/10/23  5:32 PM  Result Value Ref Range Status   Specimen Description   Final    EXPECTORATED SPUTUM Performed at [facility], [address] [place], [place] [address]    Special Requests   Final    NONE Reflexed from [id number] Performed at [facility], [address] [place], [place] [address]    Gram Stain   Final    FEW WBC PRESENT, PREDOMINANTLY PMN MODERATE GRAM POSITIVE COCCI FEW GRAM NEGATIVE RODS RARE GRAM POSITIVE RODS Performed at [facility], [address] [place], [place] [address]    Culture ABUNDANT STAPHYLOCOCCUS AUREUS  Final   Report Status 09/13/2023 FINAL  Final   Organism ID, Bacteria STAPHYLOCOCCUS AUREUS  Final      Susceptibility    Staphylococcus aureus - MIC*    CIPROFLOXACIN <=0.5 SENSITIVE Sensitive     ERYTHROMYCIN <=0.25 SENSITIVE Sensitive     GENTAMICIN <=0.5 SENSITIVE Sensitive     OXACILLIN 0.5 SENSITIVE Sensitive     TETRACYCLINE <=1 SENSITIVE Sensitive     VANCOMYCIN 1 SENSITIVE Sensitive     TRIMETH/SULFA <=10 SENSITIVE Sensitive  CLINDAMYCIN <=0.25 SENSITIVE Sensitive     RIFAMPIN <=0.5 SENSITIVE Sensitive     Inducible Clindamycin NEGATIVE Sensitive     LINEZOLID 2 SENSITIVE Sensitive     * ABUNDANT STAPHYLOCOCCUS AUREUS  MRSA Next Gen by PCR, Nasal     Status: None   Collection Time: 09/11/23 11:11 PM   Specimen: Nasal Mucosa; Nasal Swab  Result Value Ref Range Status   MRSA by PCR Next Gen NOT DETECTED NOT DETECTED Final    Comment: (NOTE) The GeneXpert MRSA Assay (FDA approved for NASAL specimens only), is one component of a comprehensive MRSA colonization surveillance program. It is not intended to diagnose MRSA infection nor to guide or monitor treatment for MRSA infections. Test performance is not FDA approved in patients less than 70 years old. Performed at [facility], [address] [place], [place] [address]     Time spent: 35 minutes  Signed: Emeline General, MD 09/15/2023

## 2023-09-29 NOTE — Progress Notes (Signed)
Chaplain spent time with Laekyn's daughter, son-in-law and granddaughter as they shared about Ayeshia's legacy of love and about their family.  Though they have been expecting this since yesterday, it still feels very shocking.  Chaplain normalized this and encouraged them to get grief support. Chaplain provided some resources for adults and for her great grandchildren. They are finding comfort in the fact that Makenzi and her husband are reunited today on Valentine's Day, as they shared a long, beautiful love and marriage of 66 years.    [address] [facility] Pager, [phone]

## 2023-09-29 DEATH — deceased

## 2023-10-02 ENCOUNTER — Ambulatory Visit: Payer: HMO | Admitting: Cardiology

## 2023-10-10 ENCOUNTER — Ambulatory Visit: Payer: HMO

## 2023-10-10 ENCOUNTER — Other Ambulatory Visit: Payer: HMO

## 2023-10-10 ENCOUNTER — Ambulatory Visit: Payer: HMO | Admitting: Nurse Practitioner

## 2023-10-30 ENCOUNTER — Ambulatory Visit: Payer: Medicare Other | Admitting: Podiatry

## 2023-10-30 NOTE — Progress Notes (Signed)
 Patient developed Acute metabolic encephalopathy on the day on death, not POA.

## 2023-10-30 NOTE — Progress Notes (Signed)
 Likely, patient had developed AKI on CKD IIIa, secondary to ATN.

## 2023-10-30 NOTE — Progress Notes (Signed)
 Patient likely had HCAP.
